# Patient Record
Sex: Female | Born: 1975
Health system: Southern US, Community
[De-identification: ages and names within clinical notes are randomized; demographics above are authoritative.]

## PROBLEM LIST (undated history)

## (undated) ENCOUNTER — Emergency Department (HOSPITAL_COMMUNITY): Discharge status: Absent without leave | Payer: 59

## (undated) DIAGNOSIS — R079 Chest pain, unspecified: Secondary | ICD-10-CM

## (undated) DIAGNOSIS — R002 Palpitations: Secondary | ICD-10-CM

## (undated) DIAGNOSIS — D259 Leiomyoma of uterus, unspecified: Secondary | ICD-10-CM

## (undated) DIAGNOSIS — K219 Gastro-esophageal reflux disease without esophagitis: Secondary | ICD-10-CM

## (undated) DIAGNOSIS — F419 Anxiety disorder, unspecified: Secondary | ICD-10-CM

## (undated) DIAGNOSIS — E119 Type 2 diabetes mellitus without complications: Secondary | ICD-10-CM

## (undated) DIAGNOSIS — M549 Dorsalgia, unspecified: Secondary | ICD-10-CM

## (undated) DIAGNOSIS — K449 Diaphragmatic hernia without obstruction or gangrene: Secondary | ICD-10-CM

## (undated) DIAGNOSIS — I10 Essential (primary) hypertension: Secondary | ICD-10-CM

## (undated) HISTORY — PX: WISDOM TOOTH EXTRACTION: SHX21

## (undated) HISTORY — DX: Diaphragmatic hernia without obstruction or gangrene: K44.9

## (undated) HISTORY — DX: Essential (primary) hypertension: I10

## (undated) HISTORY — DX: Anxiety disorder, unspecified: F41.9

## (undated) HISTORY — DX: Gastro-esophageal reflux disease without esophagitis: K21.9

## (undated) HISTORY — DX: Leiomyoma of uterus, unspecified: D25.9

## (undated) HISTORY — DX: Palpitations: R00.2

## (undated) HISTORY — DX: Chest pain, unspecified: R07.9

---

## 2009-04-29 ENCOUNTER — Ambulatory Visit (HOSPITAL_COMMUNITY): Admission: RE | Admit: 2009-04-29 | Discharge: 2009-04-29 | Payer: Self-pay | Admitting: Emergency Medicine

## 2011-06-28 ENCOUNTER — Emergency Department (HOSPITAL_COMMUNITY)
Admission: EM | Admit: 2011-06-28 | Discharge: 2011-06-29 | Disposition: A | Discharge status: Home / Self Care | Payer: 59 | Attending: Emergency Medicine | Admitting: Emergency Medicine

## 2011-06-28 DIAGNOSIS — R42 Dizziness and giddiness: Secondary | ICD-10-CM | POA: Insufficient documentation

## 2011-06-28 DIAGNOSIS — F411 Generalized anxiety disorder: Secondary | ICD-10-CM | POA: Insufficient documentation

## 2011-06-28 DIAGNOSIS — R079 Chest pain, unspecified: Secondary | ICD-10-CM | POA: Insufficient documentation

## 2011-06-28 DIAGNOSIS — I10 Essential (primary) hypertension: Secondary | ICD-10-CM | POA: Insufficient documentation

## 2011-06-28 DIAGNOSIS — R0682 Tachypnea, not elsewhere classified: Secondary | ICD-10-CM | POA: Insufficient documentation

## 2011-06-29 LAB — CBC
HCT: 39.3 % (ref 36.0–46.0)
Hemoglobin: 13.3 g/dL (ref 12.0–15.0)
MCH: 29.1 pg (ref 26.0–34.0)
MCHC: 33.8 g/dL (ref 30.0–36.0)
MCV: 86 fL (ref 78.0–100.0)
Platelets: 299 10*3/uL (ref 150–400)
RBC: 4.57 MIL/uL (ref 3.87–5.11)
RDW: 13.7 % (ref 11.5–15.5)
WBC: 11.8 10*3/uL — ABNORMAL HIGH (ref 4.0–10.5)

## 2011-06-29 LAB — POCT I-STAT TROPONIN I: Troponin i, poc: 0 ng/mL (ref 0.00–0.08)

## 2011-06-29 LAB — URINALYSIS, ROUTINE W REFLEX MICROSCOPIC
Bilirubin Urine: NEGATIVE
Glucose, UA: NEGATIVE mg/dL
Ketones, ur: NEGATIVE mg/dL
Leukocytes, UA: NEGATIVE
Nitrite: NEGATIVE
Protein, ur: NEGATIVE mg/dL
Specific Gravity, Urine: 1.025 (ref 1.005–1.030)
Urobilinogen, UA: 0.2 mg/dL (ref 0.0–1.0)
pH: 5.5 (ref 5.0–8.0)

## 2011-06-29 LAB — POCT I-STAT, CHEM 8
BUN: 10 mg/dL (ref 6–23)
Calcium, Ion: 1.18 mmol/L (ref 1.12–1.32)
Chloride: 105 mEq/L (ref 96–112)
Creatinine, Ser: 0.9 mg/dL (ref 0.50–1.10)
Glucose, Bld: 117 mg/dL — ABNORMAL HIGH (ref 70–99)
HCT: 42 % (ref 36.0–46.0)
Hemoglobin: 14.3 g/dL (ref 12.0–15.0)
Potassium: 4 mEq/L (ref 3.5–5.1)
Sodium: 141 mEq/L (ref 135–145)
TCO2: 26 mmol/L (ref 0–100)

## 2011-06-29 LAB — DIFFERENTIAL
Basophils Absolute: 0 10*3/uL (ref 0.0–0.1)
Basophils Relative: 0 % (ref 0–1)
Eosinophils Absolute: 0.3 10*3/uL (ref 0.0–0.7)
Eosinophils Relative: 2 % (ref 0–5)
Lymphocytes Relative: 41 % (ref 12–46)
Lymphs Abs: 4.8 10*3/uL — ABNORMAL HIGH (ref 0.7–4.0)
Monocytes Absolute: 0.9 10*3/uL (ref 0.1–1.0)
Monocytes Relative: 7 % (ref 3–12)
Neutro Abs: 5.8 10*3/uL (ref 1.7–7.7)
Neutrophils Relative %: 49 % (ref 43–77)

## 2011-06-29 LAB — URINE MICROSCOPIC-ADD ON

## 2011-06-29 LAB — POCT PREGNANCY, URINE: Preg Test, Ur: NEGATIVE

## 2012-01-04 ENCOUNTER — Ambulatory Visit (INDEPENDENT_AMBULATORY_CARE_PROVIDER_SITE_OTHER): Payer: 59 | Admitting: Emergency Medicine

## 2012-01-04 DIAGNOSIS — N898 Other specified noninflammatory disorders of vagina: Secondary | ICD-10-CM

## 2012-01-04 DIAGNOSIS — R109 Unspecified abdominal pain: Secondary | ICD-10-CM

## 2012-01-04 DIAGNOSIS — Z202 Contact with and (suspected) exposure to infections with a predominantly sexual mode of transmission: Secondary | ICD-10-CM

## 2012-01-04 DIAGNOSIS — N912 Amenorrhea, unspecified: Secondary | ICD-10-CM

## 2012-01-04 LAB — POCT URINALYSIS DIPSTICK
Bilirubin, UA: NEGATIVE
Glucose, UA: NEGATIVE
Ketones, UA: NEGATIVE
Leukocytes, UA: NEGATIVE
Nitrite, UA: NEGATIVE
Protein, UA: NEGATIVE
Spec Grav, UA: 1.015
Urobilinogen, UA: 0.2
pH, UA: 6.5

## 2012-01-04 LAB — POCT UA - MICROSCOPIC ONLY
Bacteria, U Microscopic: NEGATIVE
Casts, Ur, LPF, POC: NEGATIVE
Crystals, Ur, HPF, POC: NEGATIVE
Mucus, UA: NEGATIVE
WBC, Ur, HPF, POC: NEGATIVE
Yeast, UA: NEGATIVE

## 2012-01-04 LAB — POCT URINE PREGNANCY: Preg Test, Ur: NEGATIVE

## 2012-01-04 NOTE — Progress Notes (Signed)
  Subjective:    Patient ID: Brooke Turner, female    DOB: 04-27-1976, 36 y.o.   MRN: [id number]  HPI patient enters with no menstrual period for the last 2 weeks. She is on no birth control pills and feels this may be the source of her trouble. She has had a sensation she is going to cramp and have a period but her menses has not started.    Review of Systems he has no breast tenderness headaches or nausea associated with being pregnant.     Objective:   Physical Exam physical exam of the abdomen reveals mild suprapubic tenderness. Pelvic examination reveals a 1 1/2 cm papillomatous growth coming off the right sidewall of the vagina. The uterus is normal size there are no adnexal masses there is no discharge present.        Assessment & Plan:  Assessment is amenorrhea probably physiologic. I do not feel this patient is pregnant she had no evidence of on exam of an ovarian cyst and she had no evidence of any STD by exam.

## 2012-01-05 LAB — HCG, QUANTITATIVE, PREGNANCY: hCG, Beta Chain, Quant, S: <2 m[IU]/mL

## 2012-01-05 LAB — TSH: TSH: 1.234 u[IU]/mL (ref 0.350–4.500)

## 2012-01-06 LAB — GC/CHLAMYDIA PROBE AMP, URINE
Chlamydia, Swab/Urine, PCR: NEGATIVE
GC Probe Amp, Urine: NEGATIVE

## 2012-01-25 ENCOUNTER — Telehealth: Payer: Self-pay

## 2012-01-25 ENCOUNTER — Other Ambulatory Visit: Payer: Self-pay | Admitting: *Deleted

## 2012-01-25 MED ORDER — LISINOPRIL 20 MG PO TABS: 20.0000 mg | ORAL_TABLET | Freq: Every day | ORAL | Status: DC

## 2012-01-25 NOTE — Telephone Encounter (Signed)
LMOM THAT RX IS BEING SENT IN

## 2012-01-25 NOTE — Telephone Encounter (Signed)
Pt is out of her meds and states she had pharmacy fax over her med refills and it has'nt been done yet she would like to receive her meds today and ask for that I put in as urgent.Marland Kitchen

## 2012-02-01 ENCOUNTER — Telehealth: Payer: Self-pay

## 2012-02-01 NOTE — Telephone Encounter (Signed)
Pt is needing referral info to [facility] she went to back in July she then said she would like for Dr Patsy Lager to call her because she knows about this and it is urgent she would like to get in to see them by Friday.

## 2012-02-01 NOTE — Telephone Encounter (Signed)
Called pt and LMOM with info about where she had gone in July and [facility] phone number. Asked for her to CB if she still needed to speak with Dr Patsy Lager or help from someone else. Told pt that Dr Patsy Lager will not be back in office until Fri.

## 2012-02-03 ENCOUNTER — Telehealth: Payer: Self-pay

## 2012-02-03 NOTE — Telephone Encounter (Signed)
Spoke with Dr. Patsy Lager regarding patient and her request to get her Lorazepam refilled.  Md would like patient to come in for office visit first.  Patient notified and she will RTC this weekend.  Also, she plans to [facility] Monday and speak with [facility] Monday regarding one of our staff.

## 2012-02-03 NOTE — Telephone Encounter (Signed)
[facility] FAXED OVER REFILL BUT WAS DENIED.  NEED TO KNOW WHY AND NEED REFILL ASAP IF POSSIBLE  ABOUT MEDICATION FOR ANXIETY AND STRESS.  PHONE [phone]

## 2012-02-04 ENCOUNTER — Telehealth: Payer: Self-pay

## 2012-02-04 NOTE — Telephone Encounter (Signed)
We denied her refill of lorazepam yesterday. She would like [place] to reconsider. She had a refill left but it expired and she was seen recently by Dr. Merla Riches, Just uses on "as needed" basis. [facility] [address]

## 2012-02-05 NOTE — Telephone Encounter (Signed)
She was actually seen by Dr. Cleta Alberts and not by me recently If I have seen her in the past and prescribed this I might consider giving her a small supply/ check her old chart Since she was originally calling for Dr. Dallas Schimke I assume  that's where the prescription started

## 2012-02-05 NOTE — Telephone Encounter (Signed)
Dr. Patsy Lager, who actually wrote the Rx advised that she would like for the patient to come in first before another refill. We cannot change that request.  Alycia Rossetti

## 2012-02-06 NOTE — Telephone Encounter (Signed)
Called pt LMOM to CB if she needs too but advised pt to RTC

## 2012-02-17 ENCOUNTER — Ambulatory Visit (INDEPENDENT_AMBULATORY_CARE_PROVIDER_SITE_OTHER): Payer: 59 | Admitting: Family Medicine

## 2012-02-17 ENCOUNTER — Encounter: Payer: Self-pay | Admitting: Family Medicine

## 2012-02-17 VITALS — BP 163/89 | HR 92 | Temp 97.3°F | Resp 16 | Ht 62.25 in | Wt 230.4 lb

## 2012-02-17 DIAGNOSIS — F419 Anxiety disorder, unspecified: Secondary | ICD-10-CM

## 2012-02-17 DIAGNOSIS — I10 Essential (primary) hypertension: Secondary | ICD-10-CM | POA: Insufficient documentation

## 2012-02-17 DIAGNOSIS — F411 Generalized anxiety disorder: Secondary | ICD-10-CM | POA: Insufficient documentation

## 2012-02-17 DIAGNOSIS — E669 Obesity, unspecified: Secondary | ICD-10-CM | POA: Insufficient documentation

## 2012-02-17 DIAGNOSIS — G8929 Other chronic pain: Secondary | ICD-10-CM

## 2012-02-17 DIAGNOSIS — Z309 Encounter for contraceptive management, unspecified: Secondary | ICD-10-CM | POA: Insufficient documentation

## 2012-02-17 DIAGNOSIS — M549 Dorsalgia, unspecified: Secondary | ICD-10-CM

## 2012-02-17 MED ORDER — ETONOGESTREL-ETHINYL ESTRADIOL 0.12-0.015 MG/24HR VA RING: VAGINAL_RING | VAGINAL | Status: DC

## 2012-02-17 MED ORDER — LISINOPRIL 20 MG PO TABS: 20.0000 mg | ORAL_TABLET | Freq: Every day | ORAL | Status: DC

## 2012-02-17 NOTE — Patient Instructions (Addendum)
Vitamin D Deficiency  Not having enough vitamin D is called a deficiency. Your body needs this vitamin to keep your bones strong and healthy. Having too little of it can make your bones soft or can cause other health problems.  HOME CARE  Take all vitamins, herbs, or nutrition drinks (supplements) as told by your doctor.   Have your blood tested 2 months after taking vitamins, herbs, or nutrition drinks.   Eat foods that have vitamin D. This includes:   Dairy products, cereals, or juices with added vitamin D. Check the label.   Fatty fish like salmon or trout.   Eggs.   Oysters.   Go outside for 10 to 15 minutes when the sun is shining. Do this 3 times a week. Do not do this if you have skin cancer.   Do not use tanning beds.   Stay at a healthy weight. Lose weight if needed.   Keep all doctor visits as told.  GET HELP IF:  You have questions.   You continue to have problems.   You feel sick to your stomach (nauseous) or throw up (vomit).   You cannot go poop (constipated).   You feel confused.   You have severe belly (abdominal) or back pain.  MAKE SURE YOU:  Understand these instructions.   Will watch your condition.   Will get help right away if you are not doing well or get worse.  Document Released: 10/13/2011 Document Reviewed: 10/11/2011 [facility] Patient Information 2012 [place], [place]       .Hypertension As your heart beats, it forces blood through your arteries. This force is your blood pressure. If the pressure is too high, it is called hypertension (HTN) or high blood pressure. HTN is dangerous because you may have it and not know it. High blood pressure may mean that your heart has to work harder to pump blood. Your arteries may be narrow or stiff. The extra work puts you at risk for heart disease, stroke, and other problems.  Blood pressure consists of two numbers, a higher number over a lower, 110/72, for example. It is stated as "110 over 72."  The ideal is below 120 for the top number (systolic) and under 80 for the bottom (diastolic). Write down your blood pressure today. You should pay close attention to your blood pressure if you have certain conditions such as:  Heart failure.   Prior heart attack.   Diabetes   Chronic kidney disease.   Prior stroke.   Multiple risk factors for heart disease.  To see if you have HTN, your blood pressure should be measured while you are seated with your arm held at the level of the heart. It should be measured at least twice. A one-time elevated blood pressure reading (especially in the Emergency Department) does not mean that you need treatment. There may be conditions in which the blood pressure is different between your right and left arms. It is important to see your caregiver soon for a recheck. Most people have essential hypertension which means that there is not a specific cause. This type of high blood pressure may be lowered by changing lifestyle factors such as:  Stress.   Smoking.   Lack of exercise.   Excessive weight.   Drug/tobacco/alcohol use.   Eating less salt.  Most people do not have symptoms from high blood pressure until it has caused damage to the body. Effective treatment can often prevent, delay or reduce that damage. TREATMENT  When  a cause has been identified, treatment for high blood pressure is directed at the cause. There are a large number of medications to treat HTN. These fall into several categories, and your caregiver will help you select the medicines that are best for you. Medications may have side effects. You should review side effects with your caregiver. If your blood pressure stays high after you have made lifestyle changes or started on medicines,   Your medication(s) may need to be changed.   Other problems may need to be addressed.   Be certain you understand your prescriptions, and know how and when to take your medicine.   Be sure to  follow up with your caregiver within the time frame advised (usually within two weeks) to have your blood pressure rechecked and to review your medications.   If you are taking more than one medicine to lower your blood pressure, make sure you know how and at what times they should be taken. Taking two medicines at the same time can result in blood pressure that is too low.  SEEK IMMEDIATE MEDICAL CARE IF:  You develop a severe headache, blurred or changing vision, or confusion.   You have unusual weakness or numbness, or a faint feeling.   You have severe chest or abdominal pain, vomiting, or breathing problems.  MAKE SURE YOU:   Understand these instructions.   Will watch your condition.   Will get help right away if you are not doing well or get worse.  Document Released: 10/24/2005 Document Revised: 10/13/2011 Document Reviewed: 06/13/2008 [facility] Patient Information 2012 [place], [place].     Sleep Apnea Sleep apnea is a common disorder. The main problem of this disorder is excessive daytime sleepiness and compromised quality of life. This may include social and emotional problems. There are two types of sleep apnea.  Obstructive sleep apnea is when breathing stops due to a blocked airway.   Central sleep apnea is a malfunction of the brain's normal signal to breathe.  SYMPTOMS  Restless sleep.   Falling asleep while driving and/or during the day.   Loss of energy.   Irritability.   Mood or behavior changes.   Loud, heavy snoring.   Morning headaches.   Trouble concentrating.   Forgetfulness.   Anxiety or depression.   Decreased interest in sex.  Not all people with sleep apnea have all of these symptoms. However, people who have a few of these symptoms should visit their caregiver for an evaluation. Problems related to untreated sleep apnea include:  High blood pressure (hypertension).   Coronary artery disease.   Impotence.   Cognitive dysfunction.     Memory loss.  TREATMENT  For mild cases, treatment may include avoiding sleeping on one's back.   For people with nasal congestion, a decongestant may be prescribed.   Patients with obstructive and central apnea should avoid depressants. This includes alcohol, sedatives and narcotics. Weight loss and diet control are encouraged for overweight patients.   Many serious cases of obstructive sleep apnea can be relieved by a treatment called nasal continuous positive airway pressure (nasal CPAP). Nasal CPAP uses a mask-like device and pump that work together to keep the airway open. The pump delivers air pressure during each breath.   Surgery may help some patients by stopping or reducing the narrowing of the airway due to anatomical defects.  PROGNOSIS  Removing the obstruction usually reverses hypertension and cardiac problems. Untreated, sleep apnea sufferers have a tendency to fall asleep during the  day. This is can result in serious accident or loss of ones job. RESEARCH Sleep apnea is currently one of the most active areas of sleep research.  Document Released: 10/14/2002 Document Revised: 10/13/2011 Document Reviewed: 02/09/2006 [facility] Patient Information 2012 [place], [place].

## 2012-02-17 NOTE — Progress Notes (Signed)
  Subjective:    Patient ID: Brooke Turner, female    DOB: 02-Sep-1976, 36 y.o.   MRN: [id number]  HPI This 36 y.o. AA female has HTN which is well controlled on current medication. She was seen  [facility] in Feb 2013 for amenorrhea ( she was taking Jolivette OCP at the time). She decided   to go back to using NUVARING (she had 1 package) which had been discontinued last year when  her BP was elevated. She is having no side effects and wants to continue with NUVARING.        She has a sleep disorder and is scheduled for Sleep Study this Sunday evening. She continues  to experience mild anxiety at bedtime and is using Kava which she prefers to Lorazepam. This  herbal supplement was recommended by a pharmacist and she gets her supply [facility].  Walking has helped her energy level and well as motivated her weight loss efforts.       Other concerns include alopecia located at both temples; she no longer pulls her hair back as  tightly but wonders if there is some metabolic reason for the hair loss. She has chronic back pain related to  large breasts and hopes to have a reduction sometime in the future.    Review of Systems  Constitutional: Negative.   Respiratory: Positive for chest tightness and shortness of breath. Negative for cough.   Cardiovascular: Positive for palpitations.  Gastrointestinal: Negative.   Genitourinary: Positive for menstrual problem.  Musculoskeletal: Positive for back pain.  Skin:       Hair loss at temples  Psychiatric/Behavioral: Positive for sleep disturbance. The patient is nervous/anxious.        Objective:   Physical Exam  Nursing note and vitals reviewed. Constitutional: She is oriented to person, place, and time. She appears well-developed and well-nourished. No distress.  HENT:  Head: Normocephalic and atraumatic.  Eyes: No scleral icterus.  Cardiovascular: Normal rate.   Pulmonary/Chest: Effort normal. No respiratory distress.    Neurological: She is alert and oriented to person, place, and time. No cranial nerve deficit.  Psychiatric: She has a normal mood and affect. Her behavior is normal. Thought content normal.          Assessment & Plan:   1. Obesity, Class III, BMI 40-49.9 (morbid obesity)  CBC, TSH Continue exercise for weight loss and anxiety/ stress management  2. HTN (hypertension)  Basic metabolic panel, Iron Continue Lisinopril 20 mg daily  3. Contraception management  NUVARING refilled x 6 months  4. Back pain, chronic  Vitamin D, 25-hydroxy; advise about needs for supplementation if deficient Fitted with Back Brace

## 2012-02-18 LAB — TSH: TSH: 1.336 u[IU]/mL (ref 0.350–4.500)

## 2012-02-18 LAB — BASIC METABOLIC PANEL
BUN: 10 mg/dL (ref 6–23)
CO2: 22 mEq/L (ref 19–32)
Calcium: 8.8 mg/dL (ref 8.4–10.5)
Chloride: 108 mEq/L (ref 96–112)
Creat: 0.95 mg/dL (ref 0.50–1.10)
Glucose, Bld: 90 mg/dL (ref 70–99)
Potassium: 4.2 mEq/L (ref 3.5–5.3)
Sodium: 141 mEq/L (ref 135–145)

## 2012-02-18 LAB — IRON: Iron: 63 ug/dL (ref 42–145)

## 2012-02-18 LAB — CBC
HCT: 40.2 % (ref 36.0–46.0)
Hemoglobin: 13.4 g/dL (ref 12.0–15.0)
MCH: 30 pg (ref 26.0–34.0)
MCHC: 33.3 g/dL (ref 30.0–36.0)
MCV: 89.9 fL (ref 78.0–100.0)
Platelets: 331 10*3/uL (ref 150–400)
RBC: 4.47 MIL/uL (ref 3.87–5.11)
RDW: 13.4 % (ref 11.5–15.5)
WBC: 10.7 10*3/uL — ABNORMAL HIGH (ref 4.0–10.5)

## 2012-02-18 LAB — VITAMIN D 25 HYDROXY (VIT D DEFICIENCY, FRACTURES): Vit D, 25-Hydroxy: 13 ng/mL — ABNORMAL LOW (ref 30–89)

## 2012-02-22 MED ORDER — ERGOCALCIFEROL 1.25 MG (50000 UT) PO CAPS: 50000.0000 [IU] | ORAL_CAPSULE | ORAL | Status: DC

## 2012-02-22 NOTE — Progress Notes (Signed)
Quick Note:  Please call pt and advise that the following labs are abnormal...  Low Vit D level. High potency supplement Drisdol ( generic) 50,000 IU is at her pharmacy. Take 1 capsule once a week; there are RFs for Several months. Once prescription supplement is finished, get an OTC Vit D supplement or Vit D with Calcium and take it daily. Also need to get 10-15 minutes of sun exposure most days of the week and try to eat more Vit-D rich foods (salmon, tuna, mushrooms, dairy products, etc).  Other labs are normal.  Copy of results to pt. ______

## 2012-02-22 NOTE — Progress Notes (Signed)
Addended by: Dow Adolph B on: 02/22/2012 07:14 PM   Modules accepted: Orders

## 2012-02-22 NOTE — Progress Notes (Signed)
Pt has Vit D def; Drisdol 50,000 IU  1 capsule to be taken once a week has been routed to pt's pharmacy. She should continue on this supplement until she has no more RFs then get OTC Vit D 1000- 2000 IU and take this daily in addition  to other measures to correct deficiency.

## 2012-02-23 NOTE — Progress Notes (Signed)
See notes under pts lab results 

## 2012-02-24 ENCOUNTER — Telehealth: Payer: Self-pay

## 2012-02-24 MED ORDER — ETONOGESTREL-ETHINYL ESTRADIOL 0.12-0.015 MG/24HR VA RING: VAGINAL_RING | VAGINAL | Status: DC

## 2012-02-24 NOTE — Telephone Encounter (Signed)
Pt would like someone to call her regarding her lab results.

## 2012-02-24 NOTE — Telephone Encounter (Signed)
LMOM to call back

## 2012-03-16 ENCOUNTER — Telehealth: Payer: Self-pay

## 2012-03-16 MED ORDER — LORAZEPAM 0.5 MG PO TABS: 0.5000 mg | ORAL_TABLET | Freq: Every day | ORAL | Status: DC | PRN

## 2012-03-16 NOTE — Telephone Encounter (Signed)
Rx called in and patient notified.  

## 2012-03-16 NOTE — Telephone Encounter (Signed)
Patient advised that Dr. Audria Nine forgot to send in RX for Ativan/Lorazepam because it was not at pharmacy when she went to pick up others meds. Could she please send e-script for it, and patient is trying to pick it up for this weekend. Would also like for it to be called in to pharmacy close to her, [facility] on [address]. Please call patient when RX sent to pharmacy.

## 2012-03-16 NOTE — Telephone Encounter (Signed)
Can we fill this?

## 2012-03-16 NOTE — Telephone Encounter (Signed)
Done and printed

## 2012-05-28 ENCOUNTER — Ambulatory Visit: Payer: 59 | Admitting: Physician Assistant

## 2012-06-07 ENCOUNTER — Ambulatory Visit (INDEPENDENT_AMBULATORY_CARE_PROVIDER_SITE_OTHER): Payer: 59 | Admitting: Physician Assistant

## 2012-06-07 VITALS — BP 144/94 | HR 85 | Temp 98.6°F | Resp 20 | Ht 62.5 in | Wt 243.0 lb

## 2012-06-07 DIAGNOSIS — I10 Essential (primary) hypertension: Secondary | ICD-10-CM

## 2012-06-07 DIAGNOSIS — R05 Cough: Secondary | ICD-10-CM

## 2012-06-07 DIAGNOSIS — Z3041 Encounter for surveillance of contraceptive pills: Secondary | ICD-10-CM

## 2012-06-07 DIAGNOSIS — R059 Cough, unspecified: Secondary | ICD-10-CM

## 2012-06-07 DIAGNOSIS — F419 Anxiety disorder, unspecified: Secondary | ICD-10-CM

## 2012-06-07 DIAGNOSIS — Z77098 Contact with and (suspected) exposure to other hazardous, chiefly nonmedicinal, chemicals: Secondary | ICD-10-CM

## 2012-06-07 DIAGNOSIS — R110 Nausea: Secondary | ICD-10-CM

## 2012-06-07 MED ORDER — LORAZEPAM 0.5 MG PO TABS: 0.5000 mg | ORAL_TABLET | Freq: Every day | ORAL | Status: DC | PRN

## 2012-06-07 MED ORDER — BENZONATATE 100 MG PO CAPS: ORAL_CAPSULE | ORAL | Status: AC

## 2012-06-07 MED ORDER — HYDROCHLOROTHIAZIDE 12.5 MG PO TABS: 12.5000 mg | ORAL_TABLET | Freq: Every day | ORAL | Status: DC

## 2012-06-07 MED ORDER — ETONOGESTREL-ETHINYL ESTRADIOL 0.12-0.015 MG/24HR VA RING: VAGINAL_RING | VAGINAL | Status: DC

## 2012-06-07 MED ORDER — ONDANSETRON HCL 4 MG PO TABS: 4.0000 mg | ORAL_TABLET | Freq: Three times a day (TID) | ORAL | Status: AC | PRN

## 2012-06-07 NOTE — Progress Notes (Signed)
  Subjective:    Patient ID: Brooke Turner, female    DOB: Nov 23, 1975, 36 y.o.   MRN: [id number]  HPI Pt presents to clinic after a bleach exposure in her house.  She spilled almost an entire gallon of bleach on her floor and had to clean it up.  While she was cleaning the spill she experienced burning in her nose and SOB.  Today that has improved though she has a cough an has some slight nausea.  She did have to sleep in her house but she shut her bedroom door and the smell decreased.  She has been out of the house all day.  She has taken no medication and she has no h/o asthma.  She has h/o BP and it is better controlled but her at home readings are still 140s/90s and she is experiencing feet swelling for the last several months (about 6 months).  The swelling is reduce in the am.  Pt also needs her [facility] refilled, something happened to her Rx at the pharmacy.   Review of Systems  HENT: Positive for sore throat and rhinorrhea. Negative for trouble swallowing and voice change.   Eyes: Positive for itching (irritation that has been improving).  Respiratory: Positive for cough. Negative for shortness of breath and wheezing.   Cardiovascular: Negative for chest pain.       Objective:   Physical Exam  Vitals reviewed. Constitutional: She is oriented to person, place, and time. She appears well-developed and well-nourished.  HENT:  Head: Normocephalic and atraumatic.  Right Ear: External ear normal.  Left Ear: External ear normal.  Nose: Nose normal.  Mouth/Throat: Oropharynx is clear and moist. No oropharyngeal exudate.  Eyes: Conjunctivae and EOM are normal. Pupils are equal, round, and reactive to light.  Neck: Neck supple.  Cardiovascular: Normal rate, regular rhythm and normal heart sounds.   No murmur heard. Pulmonary/Chest: Effort normal and breath sounds normal. She has no wheezes.  Lymphadenopathy:    She has cervical adenopathy (AC enlarged and mild TTP B).  Neurological:  She is alert and oriented to person, place, and time.  Skin: Skin is warm and dry.  Psychiatric: She has a normal mood and affect. Her behavior is normal. Judgment and thought content normal.       Assessment & Plan:   1. Chemical exposure    2. Nausea  ondansetron (ZOFRAN) 4 MG tablet  3. HTN (hypertension)  hydrochlorothiazide (HYDRODIURIL) 12.5 MG tablet  4. Anxiety  LORazepam (ATIVAN) 0.5 MG tablet  5. Family planning, BCP (birth control pills) maintenance  etonogestrel-ethinyl estradiol (NUVARING) 0.12-0.015 MG/24HR vaginal ring  6. Cough  benzonatate (TESSALON) 100 MG capsule   Pt to expect to return home and smell to be there and possible cause some increase in symptoms of cough and burning eyes.  Pt to open windows and air out the area if she starts to experience these symptoms.  Pt given medications to help with her cough and nausea.  Due to not optimal BP control and LE edema will add HCTZ to her BP medication.  If this helps will send in the combo pill next month for the patient.  BC and anxiety meds refilled.

## 2012-07-03 ENCOUNTER — Telehealth: Payer: Self-pay

## 2012-07-03 NOTE — Telephone Encounter (Signed)
Pt wants to talk with dr Audria Nine regarding blood pressure, anxiety and stress concerns. States she is having a really fast heart beat and wants to know what could cause that. Also wants to advsied doctor that she will be submitting FMLA paperwork, as well.

## 2012-07-03 NOTE — Telephone Encounter (Signed)
I have spoken to patient, she had FMLA last year because of Anxiety and Stress. She states her heart rate has been 112 to 118 happening more frequently now. She states her BP okay but her heart races. I have advised her to come in for this. She wants you to extend her FMLA, she states she will make appt for the heart rate issues. She asked what can cause this, I have advised multiple medical issues can cause her to feel like her heart is racing, and she needs to come in and have a physician evaluate this. I advised if it remains constant she should come to walk in clinic to be evaluated urgently.

## 2012-07-03 NOTE — Telephone Encounter (Signed)
Pt does need to return to clinic for evaluation.

## 2012-07-03 NOTE — Telephone Encounter (Signed)
I have called patient to advise and she is making an appt with Dr Audria Nine

## 2012-07-06 ENCOUNTER — Ambulatory Visit: Payer: 59 | Admitting: Family Medicine

## 2012-07-13 ENCOUNTER — Ambulatory Visit (INDEPENDENT_AMBULATORY_CARE_PROVIDER_SITE_OTHER): Payer: 59 | Admitting: Family Medicine

## 2012-07-13 VITALS — BP 170/90 | HR 93 | Temp 98.1°F | Resp 20 | Ht 64.0 in | Wt 238.0 lb

## 2012-07-13 DIAGNOSIS — R1013 Epigastric pain: Secondary | ICD-10-CM

## 2012-07-13 DIAGNOSIS — Z862 Personal history of diseases of the blood and blood-forming organs and certain disorders involving the immune mechanism: Secondary | ICD-10-CM

## 2012-07-13 DIAGNOSIS — F419 Anxiety disorder, unspecified: Secondary | ICD-10-CM

## 2012-07-13 DIAGNOSIS — E559 Vitamin D deficiency, unspecified: Secondary | ICD-10-CM

## 2012-07-13 DIAGNOSIS — Z113 Encounter for screening for infections with a predominantly sexual mode of transmission: Secondary | ICD-10-CM

## 2012-07-13 DIAGNOSIS — K3189 Other diseases of stomach and duodenum: Secondary | ICD-10-CM

## 2012-07-13 DIAGNOSIS — F411 Generalized anxiety disorder: Secondary | ICD-10-CM

## 2012-07-13 DIAGNOSIS — N644 Mastodynia: Secondary | ICD-10-CM

## 2012-07-13 LAB — HIV ANTIBODY (ROUTINE TESTING W REFLEX): HIV: NONREACTIVE

## 2012-07-13 MED ORDER — FAMOTIDINE 20 MG PO TABS: 20.0000 mg | ORAL_TABLET | Freq: Two times a day (BID) | ORAL | Status: DC

## 2012-07-13 MED ORDER — LISINOPRIL 20 MG PO TABS: 20.0000 mg | ORAL_TABLET | Freq: Every day | ORAL | Status: DC

## 2012-07-13 MED ORDER — LORAZEPAM 0.5 MG PO TABS: 0.5000 mg | ORAL_TABLET | Freq: Every day | ORAL | Status: DC | PRN

## 2012-07-14 LAB — RPR

## 2012-07-14 LAB — VITAMIN D 25 HYDROXY (VIT D DEFICIENCY, FRACTURES): Vit D, 25-Hydroxy: 23 ng/mL — ABNORMAL LOW (ref 30–89)

## 2012-07-16 ENCOUNTER — Encounter: Payer: Self-pay | Admitting: Family Medicine

## 2012-07-16 NOTE — Progress Notes (Signed)
Subjective:    Patient ID: Brooke Turner, female    DOB: Jun 24, 1976, 36 y.o.   MRN: [id number]  HPI  This 36 y.o. AA female is here today, quite anxious regarding episodes of rapid heart rate and  elevated BP measured on her home monitor. The most recent episode occurred one night within  the last week; she was having some mild left upper chest/ breast pain which resulted in mild anxiety  attack, inability to sleep (frequent awakenings through the night) and fatigue the next day. She  measured her BP several times, getting varying heart rates (80-120) and BP 90-100/60-80 up to   120-160/98-110. Lorazepam provided some relief.   Pt has had this type of anxiety for > 1 year but declines to have more thorough evaluation.  She admits that her father's death ini 08-24-12sent her into "a deep depression" from  which she has not fully recovered (did not have any grief counseling).   Re: Fatigue and sleep disorder- pt was scheduled for a sleep study which she rescheduled but has  not had as of today. She does not think that she has sleep apnea and does not see the point of having  the study done.   Re: request for STD screening- she recently found out that the man she had a casual relationship with   (they were not "really a couple") was seeing other women. It is not clear whether they had an  intimate relationship or not.   She has a hx of anemia and also has a family hx of Thalassemia and wants blood testing done to   check for this disorder as this may be the cause of fatigue.    Review of Systems  Constitutional: Positive for fatigue. Negative for fever, diaphoresis, appetite change and unexpected weight change.  HENT: Negative.   Eyes: Negative.   Respiratory: Positive for chest tightness and shortness of breath. Negative for cough, choking and wheezing.   Cardiovascular: Positive for chest pain and palpitations. Negative for leg swelling.  Gastrointestinal: Negative.     Musculoskeletal: Negative.   Neurological: Positive for weakness and light-headedness. Negative for syncope, numbness and headaches.  Psychiatric/Behavioral: Positive for disturbed wake/sleep cycle and dysphoric mood. Negative for confusion and agitation. The patient is nervous/anxious.        Objective:   Physical Exam  Nursing note and vitals reviewed. Constitutional: She is oriented to person, place, and time. She appears well-developed and well-nourished. No distress.  HENT:  Head: Normocephalic and atraumatic.  Nose: Nose normal.  Mouth/Throat: Oropharynx is clear and moist.  Eyes: Conjunctivae and EOM are normal. Pupils are equal, round, and reactive to light. No scleral icterus.  Neck: Normal range of motion. Neck supple. No thyromegaly present.  Cardiovascular: Normal rate, regular rhythm and normal heart sounds.  Exam reveals no gallop and no friction rub.   No murmur heard. Pulmonary/Chest: Effort normal and breath sounds normal. No respiratory distress. She has no wheezes. She exhibits tenderness. Right breast exhibits no inverted nipple, no mass, no skin change and no tenderness. Left breast exhibits tenderness. Left breast exhibits no inverted nipple, no mass and no skin change. Breasts are symmetrical.       Breasts are extremely dense; left breast- upper inner quadrant is location of tenderness (this correlates with area of pain described by pt)  Abdominal: Soft. She exhibits no mass. There is no tenderness. There is no guarding.  Musculoskeletal: Normal range of motion. She exhibits no edema and  no tenderness.  Lymphadenopathy:    She has no cervical adenopathy.  Neurological: She is alert and oriented to person, place, and time. She has normal reflexes. No cranial nerve deficit. She exhibits normal muscle tone.  Skin: Skin is warm and dry.  Psychiatric: Her speech is normal and behavior is normal. Thought content normal. Her mood appears anxious. Her affect is not labile  and not inappropriate. Cognition and memory are normal. She does not exhibit a depressed mood.          Assessment & Plan:   1. Breast pain, left  MM Digital Screening ECG done August 2012- normal.  2. Unspecified vitamin D deficiency  Vitamin D, 25-hydroxy Advised to resume Vitamin D 50,000 IU supplement  3. Anxiety  LORazepam (ATIVAN) 0.5 MG tablet  4. Screening examination for venereal disease  HIV antibody, RPR  5. History of anemia - advised pt that her most recent Hgb was normal with normal indices. Hemoglobinopathy evaluation  6. Dyspepsia  RX: Famotidine 20 mg 1 tablet twice a day.

## 2012-07-17 LAB — HEMOGLOBINOPATHY EVALUATION
Hemoglobin Other: 0 %
Hgb A2 Quant: 2.4 % (ref 2.2–3.2)
Hgb A: 97.6 % (ref 96.8–97.8)
Hgb F Quant: 0 % (ref 0.0–2.0)
Hgb S Quant: 0 %

## 2012-07-18 NOTE — Progress Notes (Signed)
Quick Note:  Please call pt and advise that the following labs are abnormal... Vit D level has improved by 10 points (from 13 to 23). Continue Vit D supplement.  Testing for Thalassemia shows normal results; you do not have Thalassemia.  STD screening tests are negative.  Copy to pt. ______

## 2012-07-27 ENCOUNTER — Ambulatory Visit
Admission: RE | Admit: 2012-07-27 | Discharge: 2012-07-27 | Disposition: A | Discharge status: Home / Self Care | Payer: 59 | Source: Ambulatory Visit | Attending: Family Medicine | Admitting: Family Medicine

## 2012-07-27 ENCOUNTER — Other Ambulatory Visit: Payer: Self-pay | Admitting: Family Medicine

## 2012-07-27 DIAGNOSIS — N644 Mastodynia: Secondary | ICD-10-CM

## 2012-08-08 ENCOUNTER — Ambulatory Visit (INDEPENDENT_AMBULATORY_CARE_PROVIDER_SITE_OTHER): Payer: 59 | Admitting: Physician Assistant

## 2012-08-08 VITALS — BP 156/108 | HR 113 | Temp 98.5°F | Resp 20 | Ht 62.5 in | Wt 239.0 lb

## 2012-08-08 DIAGNOSIS — J029 Acute pharyngitis, unspecified: Secondary | ICD-10-CM

## 2012-08-08 DIAGNOSIS — R059 Cough, unspecified: Secondary | ICD-10-CM

## 2012-08-08 DIAGNOSIS — J310 Chronic rhinitis: Secondary | ICD-10-CM

## 2012-08-08 DIAGNOSIS — R05 Cough: Secondary | ICD-10-CM

## 2012-08-08 MED ORDER — GUAIFENESIN ER 1200 MG PO TB12: 1.0000 | ORAL_TABLET | Freq: Two times a day (BID) | ORAL | Status: DC | PRN

## 2012-08-08 MED ORDER — HYDROCOD POLST-CHLORPHEN POLST 10-8 MG/5ML PO LQCR: 5.0000 mL | Freq: Two times a day (BID) | ORAL | Status: DC | PRN

## 2012-08-08 MED ORDER — IPRATROPIUM BROMIDE 0.03 % NA SOLN: 2.0000 | Freq: Two times a day (BID) | NASAL | Status: DC

## 2012-08-08 MED ORDER — AZITHROMYCIN 250 MG PO TABS: ORAL_TABLET | ORAL | Status: DC

## 2012-08-08 NOTE — Patient Instructions (Signed)
Get plenty of rest and drink at least 64 ounces of water daily.   Your blood pressure is elevated today, likely due to your coughing and illness. Please recheck it once you are well.  If it is consistently above 140/90, please return for additional treatment.

## 2012-08-08 NOTE — Progress Notes (Signed)
Subjective:    Patient ID: Brooke Turner, female    DOB: 12/25/1975, 36 y.o.   MRN: [id number]  HPI This 36 y.o. female presents for evaluation of illness.  Symptoms began as sinus pressure, drainage, laryngitis.  Now coughing, sometimes productive of yellowish mucous, to the point of gagging/vomiting.  THis is a typical illness progression for her, but has progressed more quickly than usual this time.  Has had chills, but no fever.  No nausea.  Some HA.  No dizziness, diarrhea, myalgias, arthralgias or rash.  Chest feels sore with coughing.  Tessalon Perles leftover from a previous episode of similar symptoms aren't helping. Face and neck are sore x a few days.  Face has a lot of pressure.  States that "usually a Z-pack is the only thing that works for me."  Review of Systems As above.   Past Medical History  Diagnosis Date  . Hypertension   . GERD (gastroesophageal reflux disease)     History reviewed. No pertinent past surgical history.  Prior to Admission medications   Medication Sig Start Date End Date Taking? Authorizing Provider  benzonatate (TESSALON) 100 MG capsule  06/10/12  Yes Historical Provider, MD  lisinopril (PRINIVIL,ZESTRIL) 20 MG tablet Take 1 tablet (20 mg total) by mouth daily. 07/13/12  Yes Maurice March, MD  LORazepam (ATIVAN) 0.5 MG tablet Take 1 tablet (0.5 mg total) by mouth daily as needed. 07/13/12  Yes Maurice March, MD  etonogestrel-ethinyl estradiol (NUVARING) 0.12-0.015 MG/24HR vaginal ring Insert vaginally and leave in place for 3 consecutive weeks, then remove for 1 week. 06/07/12 06/07/13  Morrell Riddle, PA-C  famotidine (PEPCID) 20 MG tablet Take 1 tablet (20 mg total) by mouth 2 (two) times daily. 07/13/12 07/13/13  Maurice March, MD    Allergies  Allergen Reactions  . Penicillins Hives    History   Social History  . Marital Status: Single    Spouse Name: N/A    Number of Children: 0  . Years of Education: 16   Occupational  History  . Customer Service [employer]   Social History Main Topics  . Smoking status: Current Some Day Smoker  . Smokeless tobacco: Never Used   Comment: Maybe six cigarerres a week  . Alcohol Use: Yes     occassionally  . Drug Use: No  . Sexually Active: Not Currently -- Female partner(s)    Birth Control/ Protection: Other-see comments     NuvaRing   Other Topics Concern  . Not on file   Social History Narrative   Lives alone.    Family History  Problem Relation Age of Onset  . Seizures Mother   . Cancer Father     Liver  . Diabetes Father 43  . Hypertension Father   . Heart disease Father     CEA, LE [place]  . Alzheimer's disease Maternal Grandmother   . Heart disease Maternal Grandfather        Objective:   Physical Exam  Blood pressure 156/108, pulse 113, temperature 98.5 F (36.9 C), temperature source Oral, resp. rate 20, height 5' 2.5" (1.588 m), weight 239 lb (108.41 kg), last menstrual period 08/01/2012, SpO2 98.00%. Body mass index is 43.02 kg/(m^2). Well-developed, well nourished BF who is awake, alert and oriented, in NAD. HEENT: [place]/AT, PERRL, EOMI.  Sclera and conjunctiva are clear.  EAC are patent, TMs are normal in appearance. Nasal mucosa is congested, pink and moist. OP is clear. Neck: supple, non-tender, no lymphadenopathy,  thyromegaly. Heart: RRR, no murmur Lungs: normal effort, CTA Extremities: no cyanosis, clubbing or edema. Skin: warm and dry without rash. Psychologic: good mood and appropriate affect, normal speech and behavior.     Assessment & Plan:   1. Cough  azithromycin (ZITHROMAX) 250 MG tablet, chlorpheniramine-HYDROcodone (TUSSIONEX PENNKINETIC ER) 10-8 MG/5ML LQCR  2. Acute pharyngitis  Guaifenesin (MUCINEX MAXIMUM STRENGTH) 1200 MG TB12  3. Rhinitis  ipratropium (ATROVENT) 0.03 % nasal spray, Guaifenesin (MUCINEX MAXIMUM STRENGTH) 1200 MG TB12   Patient Instructions  Get plenty of rest and drink at least 64 ounces  of water daily.   Your blood pressure is elevated today, likely due to your coughing and illness. Please recheck it once you are well.  If it is consistently above 140/90, please return for additional treatment.

## 2012-08-10 ENCOUNTER — Ambulatory Visit: Payer: 59 | Admitting: Family Medicine

## 2012-08-17 ENCOUNTER — Ambulatory Visit: Payer: 59

## 2012-08-17 ENCOUNTER — Ambulatory Visit (INDEPENDENT_AMBULATORY_CARE_PROVIDER_SITE_OTHER): Payer: 59 | Admitting: Family Medicine

## 2012-08-17 ENCOUNTER — Encounter: Payer: Self-pay | Admitting: Family Medicine

## 2012-08-17 VITALS — BP 169/103 | HR 101 | Temp 99.2°F | Resp 18 | Ht 62.5 in | Wt 242.0 lb

## 2012-08-17 DIAGNOSIS — J40 Bronchitis, not specified as acute or chronic: Secondary | ICD-10-CM

## 2012-08-17 DIAGNOSIS — I10 Essential (primary) hypertension: Secondary | ICD-10-CM

## 2012-08-17 MED ORDER — ATENOLOL-CHLORTHALIDONE 50-25 MG PO TABS: ORAL_TABLET | ORAL | Status: DC

## 2012-08-17 MED ORDER — PREDNISONE 10 MG PO TABS: ORAL_TABLET | ORAL | Status: DC

## 2012-08-17 MED ORDER — PREDNISONE 5 MG PO TABS: ORAL_TABLET | ORAL | Status: DC

## 2012-08-17 NOTE — Patient Instructions (Addendum)
Hypertension Information As your heart beats, it forces blood through your arteries. This force is your blood pressure. If the pressure is too high, it is called hypertension (HTN) or high blood pressure. HTN is dangerous because you may have it and not know it. High blood pressure may mean that your heart has to work harder to pump blood. Your arteries may be narrow or stiff. The extra work puts you at risk for heart disease, stroke, and other problems.  Blood pressure consists of two numbers, a higher number over a lower, 110/72, for example. It is stated as "110 over 72." The ideal is below 120 for the top number (systolic) and under 80 for the bottom (diastolic).  You should pay close attention to your blood pressure if you have certain conditions such as:  Heart failure.   Prior heart attack.   Diabetes   Chronic kidney disease.   Prior stroke.   Multiple risk factors for heart disease.  To see if you have HTN, your blood pressure should be measured while you are seated with your arm held at the level of the heart. It should be measured at least twice. A one-time elevated blood pressure reading (especially in the Emergency Department) does not mean that you need treatment. There may be conditions in which the blood pressure is different between your right and left arms. It is important to see your caregiver soon for a recheck. Most people have essential hypertension which means that there is not a specific cause. This type of high blood pressure may be lowered by changing lifestyle factors such as:  Stress.   Smoking.   Lack of exercise.   Excessive weight.   Drug/tobacco/alcohol use.   Eating less salt.  Most people do not have symptoms from high blood pressure until it has caused damage to the body. Effective treatment can often prevent, delay or reduce that damage. TREATMENT  Treatment for high blood pressure, when a cause has been identified, is directed at the cause. There  are a large number of medications to treat HTN. These fall into several categories, and your caregiver will help you select the medicines that are best for you. Medications may have side effects. You should review side effects with your caregiver. If your blood pressure stays high after you have made lifestyle changes or started on medicines,   Your medication(s) may need to be changed.   Other problems may need to be addressed.   Be certain you understand your prescriptions, and know how and when to take your medicine.   Be sure to follow up with your caregiver within the time frame advised (usually within two weeks) to have your blood pressure rechecked and to review your medications.   If you are taking more than one medicine to lower your blood pressure, make sure you know how and at what times they should be taken. Taking two medicines at the same time can result in blood pressure that is too low.  Document Released: 12/27/2005 Document Revised: 07/06/2011 Document Reviewed: 01/03/2008 Exit[employer] Patient Information 2012 [employer]. 

## 2012-08-17 NOTE — Progress Notes (Signed)
  Subjective:    Patient ID: Brooke Turner, female    DOB: 02-Nov-1976, 36 y.o.   MRN: [id number]  HPI   This 36 y.o. AA female returns today for follow-up after recent treatment for bronchitis.  She has finished Z-PAK and still has nonprod cough with chest pain. Work is difficult because  her job involves talking much of the time. Cough meds somewhat effective.   HTN- pt thinks BP is up because of cough. Home readings are 140-150/90/ She denies HA, CP or  tightness, dizziness, numbness, weakness or syncope. Does have occasional palpitations.  Pt does not think cough is related to Lisinopril (states "this cough is different").     Review of Systems  Constitutional: Positive for fever. Negative for diaphoresis and unexpected weight change.  HENT: Positive for congestion, rhinorrhea and sinus pressure. Negative for ear pain, sneezing, trouble swallowing and neck stiffness.   Eyes: Negative for visual disturbance.  Respiratory: Positive for cough. Negative for chest tightness and wheezing.   Cardiovascular: Positive for leg swelling. Negative for chest pain.  Neurological: Negative.        Objective:   Physical Exam  Nursing note and vitals reviewed. Constitutional: She is oriented to person, place, and time. She appears well-developed and well-nourished. No distress.  HENT:  Head: Normocephalic and atraumatic.  Right Ear: Hearing, external ear and ear canal normal. Tympanic membrane is scarred.  Left Ear: Hearing, external ear and ear canal normal.  Nose: Mucosal edema present. No rhinorrhea, nasal deformity or septal deviation. Right sinus exhibits maxillary sinus tenderness. Right sinus exhibits no frontal sinus tenderness. Left sinus exhibits maxillary sinus tenderness. Left sinus exhibits no frontal sinus tenderness.  Mouth/Throat: Uvula is midline and mucous membranes are normal. No oral lesions. Normal dentition. Posterior oropharyngeal erythema present. No oropharyngeal  exudate.  Eyes: Conjunctivae normal and EOM are normal. No scleral icterus.  Neck: Normal range of motion.  Cardiovascular: Regular rhythm and normal heart sounds.   No extrasystoles are present. Tachycardia present.  Exam reveals no friction rub.   No murmur heard. Pulmonary/Chest: No accessory muscle usage. Not tachypneic. No respiratory distress. She has decreased breath sounds in the right lower field and the left lower field. She has no wheezes. She has no rhonchi. She exhibits no tenderness.       Fair inspiratory effort  Musculoskeletal: Normal range of motion. She exhibits no edema.  Lymphadenopathy:    She has no cervical adenopathy.  Neurological: She is alert and oriented to person, place, and time. No cranial nerve deficit.  Skin: Skin is warm and dry. No rash noted.     [facility] reading (PRIMARY) by  Dr. Audria Nine: CXR- no active disease. Heart size normal.      Assessment & Plan:   1. Bronchitis  RX: Predisone  5 mg tabs   Taper from 2 tabs tid on Day 1 (taking 1 less tab each day) to 1 tab on Day 6 Take all doses with meals or snacks (Pt very concerned about weight gain)  2. HTN, goal below 130/80 - pt stated that HCTZ not effective for lowering BP Continue Lisinopril RX: Atenolol- Chlorthalidone 50-25 mg  1/2 tab daily until return visit in 3 weeks.   Pt mentions FMLA paperwork that she may need completed again for the next 12 months.  Advised to bring form to follow-up visit.

## 2012-08-18 ENCOUNTER — Telehealth: Payer: Self-pay | Admitting: Radiology

## 2012-08-18 NOTE — Telephone Encounter (Signed)
Pharmacy called to verify the dose on the prednisone advised.

## 2012-08-20 ENCOUNTER — Telehealth: Payer: Self-pay

## 2012-08-20 NOTE — Telephone Encounter (Signed)
PT SAW DR [facility] ON Friday FOR HER COUGH. SHE STARTED TAKING HER PREDNISONE YESTERDAY MORNING BUT SHE SAYS IT IS NOT WORKING YET ANS WANTS TO KNOW WHEN IT WILL KICK IN. SHE STATES THAT SHE IS COUGHING SO HARD ALMOST TO THE POINT OF PASSING OUT. PLEASE CALL [phone]

## 2012-08-20 NOTE — Telephone Encounter (Signed)
Please advise 

## 2012-08-21 ENCOUNTER — Telehealth: Payer: Self-pay | Admitting: Family Medicine

## 2012-08-21 DIAGNOSIS — R059 Cough, unspecified: Secondary | ICD-10-CM

## 2012-08-21 DIAGNOSIS — R05 Cough: Secondary | ICD-10-CM

## 2012-08-21 MED ORDER — HYDROCOD POLST-CHLORPHEN POLST 10-8 MG/5ML PO LQCR: 5.0000 mL | Freq: Two times a day (BID) | ORAL | Status: DC | PRN

## 2012-08-21 MED ORDER — BENZONATATE 100 MG PO CAPS: ORAL_CAPSULE | ORAL | Status: DC

## 2012-08-21 NOTE — Telephone Encounter (Addendum)
I am going to refill Tussionex liquid for pt as well as Tessalon perles. Predisone should be helping; I prescribed a lower dose for pt because she had so many concerns about side effects of steroids. The refills will be routed to her pharmacy.  Pt was contact by staff at 104 to pick up RX for Tussionex. She had a 30-minute conversation with staff person (she "did not want any more cough medication",etc.); she was offered an appt to return to clinic on Wednesday AM. She declined and stated that she would wait. She was advised to make an appt for Friday afternoon and she could cancel it if she felt better. She voiced understanding to the staff person (Renay [employer], Careers adviser) who spoke with her.

## 2012-08-22 NOTE — Telephone Encounter (Signed)
Thanks

## 2012-09-07 ENCOUNTER — Encounter: Payer: Self-pay | Admitting: Family Medicine

## 2012-09-07 ENCOUNTER — Ambulatory Visit (INDEPENDENT_AMBULATORY_CARE_PROVIDER_SITE_OTHER): Payer: 59 | Admitting: Family Medicine

## 2012-09-07 VITALS — BP 124/76 | HR 88 | Temp 98.2°F | Resp 17 | Ht 64.0 in | Wt 241.0 lb

## 2012-09-07 DIAGNOSIS — R0600 Dyspnea, unspecified: Secondary | ICD-10-CM

## 2012-09-07 DIAGNOSIS — I10 Essential (primary) hypertension: Secondary | ICD-10-CM

## 2012-09-07 DIAGNOSIS — R0989 Other specified symptoms and signs involving the circulatory and respiratory systems: Secondary | ICD-10-CM

## 2012-09-07 DIAGNOSIS — R079 Chest pain, unspecified: Secondary | ICD-10-CM

## 2012-09-07 DIAGNOSIS — R0609 Other forms of dyspnea: Secondary | ICD-10-CM

## 2012-09-07 MED ORDER — ALBUTEROL SULFATE HFA 108 (90 BASE) MCG/ACT IN AERS: 2.0000 | INHALATION_SPRAY | Freq: Four times a day (QID) | RESPIRATORY_TRACT | Status: DC | PRN

## 2012-09-07 NOTE — Patient Instructions (Signed)
I will take another look at your FMLA form and try to make corrections. Next visit, we will discuss what needs to happen to move towards breast reduction surgery.

## 2012-09-11 ENCOUNTER — Telehealth: Payer: Self-pay

## 2012-09-11 NOTE — Telephone Encounter (Signed)
PT STATES DR MCPHERSON WAS GOING TO REDO HER FMLA PAPERS AND HAVE THEM FOR HER. SHE NEEDS THE PAPERS BY TOMORROW PLEASE CALL [phone]

## 2012-09-12 ENCOUNTER — Encounter: Payer: Self-pay | Admitting: Family Medicine

## 2012-09-12 DIAGNOSIS — N62 Hypertrophy of breast: Secondary | ICD-10-CM | POA: Insufficient documentation

## 2012-09-12 NOTE — Progress Notes (Signed)
S: Pt returns for HTN medicationand bronchitis/ cough follow-up which she states is much better. She has been w/o fever/ chills or significant hoarseness. She is resting better at night w/o sleep interruption due to cough. She denies productive cough, SOB, wheezing or palpitations. She still has occasional anxiety and some dyspnea on exertion along with mild chest discomfort. She requests referral to Cardiology be redone as she did not follow through on previous referral. Pt attributes recurrent back pain and chest discomfort to breast size. She would like referral for reduction surgery. Currently, she wears 2 sports bras for support but notes minimal improvement.  She also states her FMLA paperwork needs some amending.  ROS: As per HPI. Negative for HA, dizziness or lightheadedness, numbness, weakness, tremors, syncope.  O:  Filed Vitals:   09/07/12 1555  BP: 124/76                                                Weight up 11 lbs since April  Pulse: 88  Temp: 98.2 F (36.8 C)  Resp: 17   GEN: In NAD; WN,WD. HEENT: [place]/AT; EOMI w/ clear conj/scl. Oroph is normal with mild erythema. NECK: No LAN or TMG. LUNGS: CTA w/o rhonchi or wheezes. COR: RRR. NEURO: A&O x 3; CNs intact. Nonfocal.   A/P: 1. HTN (hypertension) - stable. Continue current medications.  2. Dyspnea on exertion    3. Chest pain on exertion  Ambulatory referral to Cardiology   FMLA paperwork: I will review forms and amend information where appropriate; this was explained to pt.

## 2012-09-28 ENCOUNTER — Ambulatory Visit: Payer: 59 | Admitting: Cardiovascular Disease

## 2012-10-11 ENCOUNTER — Telehealth: Payer: Self-pay

## 2012-10-11 ENCOUNTER — Other Ambulatory Visit: Payer: Self-pay | Admitting: Family Medicine

## 2012-10-11 DIAGNOSIS — R0789 Other chest pain: Secondary | ICD-10-CM

## 2012-10-11 DIAGNOSIS — Z3041 Encounter for surveillance of contraceptive pills: Secondary | ICD-10-CM

## 2012-10-11 MED ORDER — FLUCONAZOLE 150 MG PO TABS: 150.0000 mg | ORAL_TABLET | Freq: Once | ORAL | Status: DC

## 2012-10-11 NOTE — Telephone Encounter (Signed)
Dr Audria Nine   Pt would like rx for yeast infection pill  Also would like referral to be changed to [facility]--she did not want to go to orig place   Pt phone# [phone]  [facility] [address]

## 2012-10-15 MED ORDER — ETONOGESTREL-ETHINYL ESTRADIOL 0.12-0.015 MG/24HR VA RING: VAGINAL_RING | VAGINAL | Status: DC

## 2012-10-15 NOTE — Telephone Encounter (Signed)
She is currently using Nuva ring, please advise.

## 2012-10-15 NOTE — Telephone Encounter (Signed)
Sent to [facility]

## 2012-10-15 NOTE — Telephone Encounter (Signed)
LMOM that we received her request and have sent to pharmacy.

## 2012-10-15 NOTE — Telephone Encounter (Signed)
Dr Audria Nine   Patient is wanting to know if you could call in a rx for birth control.     [facility] [address]   Best#: [phone]

## 2012-10-16 ENCOUNTER — Telehealth: Payer: Self-pay | Admitting: *Deleted

## 2012-10-16 NOTE — Telephone Encounter (Signed)
I only see Nuvaring on her medication list, and it was just refilled.  When did she change?  And who made that change?

## 2012-10-16 NOTE — Telephone Encounter (Signed)
Pt needs refill on BCP, Jovlivette.  She was taken off Nuvaring due to her having HTN.

## 2012-10-17 NOTE — Telephone Encounter (Signed)
LMOM to CB. 

## 2012-10-18 NOTE — Telephone Encounter (Signed)
The patient returned call. The patient stated that she was on the Jovlivette in January of 2013 and she had previously been on it in 2012.  The patient stated she "just wants the medicine called into her pharmacy"  Please call the patient at [phone]

## 2012-10-18 NOTE — Telephone Encounter (Signed)
I reviewed pt's record; I prescribed NUVARING in April 2013 w/ 6 RFs at pt's request. She stated she had been on Jolivette but stopped taking it and resumed NUVARING because she happen to have one. At her visit in November, we did not discuss contraception. I will not refill any more contraception until she has had GYN exam/ status updated (either here or at previous GYN).

## 2012-10-18 NOTE — Telephone Encounter (Signed)
I called [facility] to see when she last was on Jovlivette and who Rxd it. I was told that pt last p/up Jovlivette in Jan/2013 written by Dr Patsy Lager. She didn't get any other BC from them until picking up Nuvaring on 10/14/12. Dr McPherson,did you change her to Jovlivette d/t her BP when you saw her in Nov? I don't see any notes on that, and don't see a PAP lab since 04/15/10 in her paper chart [id number]. Do you want to send in a Rx/RFs of her Jovlivette? I am bringing her chart to 104 for you.

## 2012-10-19 ENCOUNTER — Telehealth: Payer: Self-pay | Admitting: Radiology

## 2012-10-19 ENCOUNTER — Ambulatory Visit: Payer: 59 | Admitting: Cardiology

## 2012-10-19 NOTE — Telephone Encounter (Signed)
See previous phone conversations with patient. She is upset her BCP will not be renewed without an exam. Wants me to have Dr Audria Nine call her. I advised her I will let Dr Audria Nine know she wants phone call.

## 2012-10-19 NOTE — Telephone Encounter (Signed)
Called patient to advise. Left message for her to call me back, so I can discuss.

## 2012-10-19 NOTE — Telephone Encounter (Signed)
Patient advised. She is upset, patient states she can not come in. She states Dr Patsy Lager changed it due to her hypertension. She states she is not due for an exam. I have advised her she is due for this and should make appt. She does not want to schedule

## 2012-10-21 ENCOUNTER — Ambulatory Visit (INDEPENDENT_AMBULATORY_CARE_PROVIDER_SITE_OTHER): Payer: 59 | Admitting: Emergency Medicine

## 2012-10-21 VITALS — BP 128/85 | HR 102 | Temp 98.6°F | Resp 18 | Ht 64.0 in | Wt 240.0 lb

## 2012-10-21 DIAGNOSIS — N912 Amenorrhea, unspecified: Secondary | ICD-10-CM

## 2012-10-21 DIAGNOSIS — Z3041 Encounter for surveillance of contraceptive pills: Secondary | ICD-10-CM

## 2012-10-21 DIAGNOSIS — K089 Disorder of teeth and supporting structures, unspecified: Secondary | ICD-10-CM

## 2012-10-21 DIAGNOSIS — Z30011 Encounter for initial prescription of contraceptive pills: Secondary | ICD-10-CM

## 2012-10-21 LAB — POCT URINE PREGNANCY
Preg Test, Ur: NEGATIVE
Preg Test, Ur: NEGATIVE

## 2012-10-21 MED ORDER — NORETHINDRONE 0.35 MG PO TABS: 1.0000 | ORAL_TABLET | Freq: Every day | ORAL | Status: DC

## 2012-10-21 MED ORDER — FLUCONAZOLE 150 MG PO TABS: 150.0000 mg | ORAL_TABLET | Freq: Once | ORAL | Status: DC

## 2012-10-21 MED ORDER — HYDROCODONE-ACETAMINOPHEN 5-325 MG PO TABS: 1.0000 | ORAL_TABLET | Freq: Four times a day (QID) | ORAL | Status: DC | PRN

## 2012-10-21 NOTE — Progress Notes (Signed)
  Subjective:    Patient ID: Brooke Turner, female    DOB: July 24, 1976, 36 y.o.   MRN: [id number]  HPI    Review of Systems     Objective:   Physical Exam        Assessment & Plan:

## 2012-10-21 NOTE — Progress Notes (Signed)
  Subjective:    Patient ID: Brooke Turner, female    DOB: [birth year], 36 y.o.   MRN: [id number]  HPI problem #1 severe pain in the tooth right upper molar where she is due to have a root canal. Problem #2 is birth control therapy. She has a history of hypertension and is currently seeing [facility] for evaluation she was prescribed Micronor but did not take this medication. Problem #3 is a rash on her neck. She is concerned about pigmented area on the back of her neck. She was told about this by the cardiologist she saw the. Problem number four  is a yeast infection. She's been bothered with a yeast vaginitis and she's been taking clindamycin for her infected tooth she is also requesting medication for pain for the tooth    Review of Systems     Objective:   Physical Exam HEENT exam is unremarkable except for acanthosis migrans of her in her neck Chest is clear heart regular rate no murmurs abdomen soft nontender  Results for orders placed in visit on 07/13/12  VITAMIN D 25 HYDROXY      Component Value Range   Vit D, 25-Hydroxy 23 (*) 30 - 89 ng/mL  HIV ANTIBODY (ROUTINE TESTING)      Component Value Range   HIV NON REACTIVE  NON REACTIVE  RPR      Component Value Range   RPR NON REAC  NON REAC  HEMOGLOBINOPATHY EVALUATION      Component Value Range   Hgb A 97.6  96.8 - 97.8 %   Hgb A2 Quant 2.4  2.2 - 3.2 %   Hgb F Quant 0.0  0.0 - 2.0 %   Hgb S Quant 0.0  0.0 %   Hemoglobin Other 0.0  0.0 %       Assessment & Plan:   I wrote the patient for #20 Norco 5 to have for her dental pain. I gave her a prescription for Diflucan to take for her yeast infection. I gave her a prescription for one pack of Micronor with one refill until she can followup with Dr. Audria Nine.. I also gave her some triamcinolone cream to use on her rash on the back of her neck .

## 2012-10-25 ENCOUNTER — Telehealth: Payer: Self-pay | Admitting: *Deleted

## 2012-10-25 NOTE — Telephone Encounter (Signed)
Left message for patient to call back concerning FMLA papers.

## 2012-10-26 ENCOUNTER — Telehealth: Payer: Self-pay | Admitting: *Deleted

## 2012-10-26 NOTE — Telephone Encounter (Signed)
Patient wants to have the options for office visits 2 times every 3 months because of the HTN AND ANXIETY problems.  Also, the chest pains are increasing more often.  On 10/19/2012, she went to [facility] for the appointment  And she was advised to get a stress test for further evaluation. She stated she has not heard from [facility], I told her to call their office and check with them.

## 2012-10-26 NOTE — Telephone Encounter (Signed)
Patient returned call from 10/25/2012, she wants to have the options

## 2012-11-05 ENCOUNTER — Other Ambulatory Visit (HOSPITAL_COMMUNITY): Payer: Self-pay | Admitting: Physician Assistant

## 2012-11-05 DIAGNOSIS — I10 Essential (primary) hypertension: Secondary | ICD-10-CM

## 2012-11-05 DIAGNOSIS — F419 Anxiety disorder, unspecified: Secondary | ICD-10-CM

## 2012-11-05 DIAGNOSIS — E669 Obesity, unspecified: Secondary | ICD-10-CM

## 2012-11-05 DIAGNOSIS — R079 Chest pain, unspecified: Secondary | ICD-10-CM

## 2012-11-09 ENCOUNTER — Telehealth: Payer: Self-pay

## 2012-11-09 NOTE — Telephone Encounter (Signed)
Dr Audria Nine, also see phone message from 10/26/12. I'm not sure that you ever received this previous message, I don't see that it was routed to you.

## 2012-11-09 NOTE — Telephone Encounter (Signed)
Dr. Eyvonne Mechanic states she asked someone here a couple of weeks ago to pass on a message to you that she needs her FMLA paperwork updated to have 2 office visits every 3 months instead of 1.  Right now her job does not permit time off without documentation.  She would like you to update and initial this and fax it to Rosann Auerbach (she states the number is on paperwork we should have)  Best # [phone]

## 2012-11-11 ENCOUNTER — Telehealth: Payer: Self-pay | Admitting: Family Medicine

## 2012-11-11 NOTE — Telephone Encounter (Signed)
Re: FMLA paperwork- pt has requested several changes be made to the forms. I have honored one request but pt is wanting absences from work covered that  I do not think are warranted. I left a message stating that I would like to discuss her request and try to clarify what is appropriate given her current health status and ongoing evaluation with the Cardiology specialist. I have advised that she consider treatment for anxiety in the past but she declines this; she is requesting 2 visits every 3 months to address this issue. The appropriate course of action would be to have the anxiety addressed and treated such that she would not have to miss days from work with this condition.  I will attempt to contact pt tomorrow to discuss her request.

## 2012-11-16 ENCOUNTER — Encounter (HOSPITAL_COMMUNITY): Payer: 59

## 2012-11-21 ENCOUNTER — Encounter: Payer: Self-pay | Admitting: Cardiology

## 2012-12-07 ENCOUNTER — Telehealth: Payer: Self-pay

## 2012-12-07 ENCOUNTER — Other Ambulatory Visit: Payer: Self-pay | Admitting: Radiology

## 2012-12-07 MED ORDER — NORETHINDRONE 0.35 MG PO TABS: 1.0000 | ORAL_TABLET | Freq: Every day | ORAL | Status: DC

## 2012-12-07 NOTE — Telephone Encounter (Signed)
E rx did not go / was faxed.

## 2012-12-07 NOTE — Telephone Encounter (Signed)
I have sent the OCPs that Dr. Cleta Alberts prescribed in December.  I have only sent a one month supply because she needs to discuss with Dr. Audria Nine at her Feb appt

## 2012-12-07 NOTE — Telephone Encounter (Signed)
Thank you. Patient aware.

## 2012-12-07 NOTE — Telephone Encounter (Signed)
Dr Cleta Alberts states he gave her 1 month until she can get in to see Dr Audria Nine. Her appt with Dr Audria Nine is scheduled for 12/20/12 please advise. However review first, because I see the OCP Dr Cleta Alberts prescribed, also see nuvaring, unsure which she should use, or why it was changed.

## 2012-12-07 NOTE — Telephone Encounter (Signed)
Patient is needing a refill on her BCP she would like to have what dr Brooke Turner gave her.   [facility] [address]  PH#: [phone]

## 2012-12-14 ENCOUNTER — Ambulatory Visit: Payer: 59 | Admitting: Family Medicine

## 2012-12-21 ENCOUNTER — Ambulatory Visit: Payer: 59 | Admitting: Family Medicine

## 2013-01-17 ENCOUNTER — Other Ambulatory Visit: Payer: Self-pay | Admitting: Family Medicine

## 2013-03-29 ENCOUNTER — Ambulatory Visit (INDEPENDENT_AMBULATORY_CARE_PROVIDER_SITE_OTHER): Payer: 59 | Admitting: Family Medicine

## 2013-03-29 VITALS — BP 120/88 | HR 86 | Temp 99.5°F | Resp 16 | Ht 63.0 in | Wt 243.0 lb

## 2013-03-29 DIAGNOSIS — J029 Acute pharyngitis, unspecified: Secondary | ICD-10-CM

## 2013-03-29 DIAGNOSIS — Z309 Encounter for contraceptive management, unspecified: Secondary | ICD-10-CM

## 2013-03-29 DIAGNOSIS — IMO0001 Reserved for inherently not codable concepts without codable children: Secondary | ICD-10-CM

## 2013-03-29 DIAGNOSIS — I10 Essential (primary) hypertension: Secondary | ICD-10-CM

## 2013-03-29 LAB — POCT RAPID STREP A (OFFICE): Rapid Strep A Screen: NEGATIVE

## 2013-03-29 MED ORDER — NORETHINDRONE 0.35 MG PO TABS: 1.0000 | ORAL_TABLET | Freq: Every day | ORAL | Status: DC

## 2013-03-29 MED ORDER — AZITHROMYCIN 250 MG PO TABS: ORAL_TABLET | ORAL | Status: DC

## 2013-03-29 NOTE — Progress Notes (Addendum)
[facility] [address], [place] [address] [phone]  Date:  03/29/2013   Name:  Brooke Turner   DOB:  Jul 08, 1976   MRN:  [id number]  PCP:  No primary provider on file.    Chief Complaint: Contraception and Sore Throat   History of Present Illness:  Brooke Turner is a 37 y.o. very pleasant female patient who presents with the following:  She is concerned that she might be getting strep throat.  She has had a bad ST for one day.  She has also noted aches, chills and fatigue.  She does not have a cough.  She does have copious sinus drainage.  She had a temperature today of 99.? She also needs a refill of her OCP.   She does not have a history of abnormal pap, no new sexual partners.  She does take HTN medications.   She has quit smoking except she does admit to having an occasional cigarette when out with her friends.   Her LMP was last week.  She is on progesterone only OCP due to her HTN and tobacco use.   Brooke Turner does not wish to have a pap today   Patient Active Problem List   Diagnosis Date Noted  . Large breasts 09/12/2012  . HTN (hypertension) 02/17/2012  . Contraception management 02/17/2012  . Obesity, Class III, BMI 40-49.9 (morbid obesity) 02/17/2012  . Anxiety disorder 02/17/2012  . Chronic back pain 02/17/2012    Past Medical History  Diagnosis Date  . Hypertension   . GERD (gastroesophageal reflux disease)     History reviewed. No pertinent past surgical history.  History  Substance Use Topics  . Smoking status: Former Smoker    Types: Cigarettes    Quit date: 08/07/2012  . Smokeless tobacco: Never Used     Comment: Maybe six cigarerres a week  . Alcohol Use: Yes     Comment: occassionally    Family History  Problem Relation Age of Onset  . Seizures Mother   . Cancer Father     Liver  . Diabetes Father 84  . Hypertension Father   . Heart disease Father     CEA, LE Stenting  . Alzheimer's disease  Maternal Grandmother   . Heart disease Maternal Grandfather     Allergies  Allergen Reactions  . Penicillins Hives    Medication list has been reviewed and updated.  Current Outpatient Prescriptions on File Prior to Visit  Medication Sig Dispense Refill  . albuterol (PROVENTIL HFA;VENTOLIN HFA) 108 (90 BASE) MCG/ACT inhaler Inhale 2 puffs into the lungs every 6 (six) hours as needed.  1 Inhaler  3  . atenolol-chlorthalidone (TENORETIC 50) 50-25 MG per tablet Take 1/2 - 1 tablet every day to lower BP.  30 tablet  3  . ipratropium (ATROVENT) 0.03 % nasal spray Place 2 sprays into the nose 2 (two) times daily.  30 mL  5  . lisinopril (PRINIVIL,ZESTRIL) 20 MG tablet take 1 tablet by mouth once daily  90 tablet  0  . LORazepam (ATIVAN) 0.5 MG tablet Take 1 tablet (0.5 mg total) by mouth daily as needed.  30 tablet  0  . norethindrone (ORTHO MICRONOR) 0.35 MG tablet Take 1 tablet (0.35 mg total) by mouth daily.  1 Package  0  . famotidine (PEPCID) 20 MG tablet Take 1 tablet (20 mg total) by mouth 2 (two) times daily.  60 tablet  2   No current facility-administered medications on  file prior to visit.    Review of Systems:  As per HPI- otherwise negative.   Physical Examination: Filed Vitals:   03/29/13 1728  BP: 120/88  Pulse: 86  Temp: 99.5 F (37.5 C)  Resp: 16   Filed Vitals:   03/29/13 1728  Height: 5\' 3"  (1.6 m)  Weight: 243 lb (110.224 kg)   Body mass index is 43.06 kg/(m^2). Ideal Body Weight: Weight in (lb) to have BMI = 25: 140.8  GEN: WDWN, NAD, Non-toxic, A & O x 3 HEENT: Atraumatic, Normocephalic. Neck supple. No masses, No LAD.  Bilateral TM wnl, oropharynx slightly injected but no exudate.  PEERL,EOMI.   Ears and Nose: No external deformity. CV: RRR, No M/G/R. No JVD. No thrill. No extra heart sounds. PULM: CTA B, no wheezes, crackles, rhonchi. No retractions. No resp. distress. No accessory muscle use. ABD: S, NT, ND. No rebound. No HSM. EXTR: No  c/c/e NEURO Normal gait.  PSYCH: Normally interactive. Conversant. Not depressed or anxious appearing.  Calm demeanor.   Results for orders placed in visit on 03/29/13  POCT RAPID STREP A (OFFICE)      Result Value Range   Rapid Strep A Screen Negative  Negative    Assessment and Plan: Acute pharyngitis - Plan: POCT rapid strep A, Culture, Group A Strep, azithromycin (ZITHROMAX) 250 MG tablet  Contraception - Plan: norethindrone (ORTHO MICRONOR) 0.35 MG tablet  Treat for possible strep with azithromycin (penicillin allergy).  Cautioned regarding possible inactivation of her OCP.   Plan to do a pap in the next few months.    Signed Abbe Amsterdam, MD  5/24- called and LMOM.  She has not had a BMP since she started the chlorthalidone lasst fall- it would be smart to check this.  Asked her to please stop by in the next few days for a lab visit only

## 2013-03-29 NOTE — Patient Instructions (Addendum)
We are going to treat you for possible strep throat.  Use the azithromycin as directed. If you are not better in the next few days please let me know- Sooner if worse.   Please come and see [place] for a pap in the next 6- 9 months.

## 2013-03-30 NOTE — Addendum Note (Signed)
Addended by: Abbe Amsterdam C on: 03/30/2013 03:41 PM   Modules accepted: Orders

## 2013-03-31 LAB — CULTURE, GROUP A STREP: Organism ID, Bacteria: NORMAL

## 2013-04-02 ENCOUNTER — Encounter: Payer: Self-pay | Admitting: Family Medicine

## 2013-04-12 ENCOUNTER — Other Ambulatory Visit: Payer: Self-pay | Admitting: Family Medicine

## 2013-04-12 NOTE — Telephone Encounter (Signed)
Dr Patsy Lager, do you want to RF pt's BP med? You just saw her but then your note said that you asked pt to come in for BMP only. I don't see where she has done this - under Labs it says "cancelled"?

## 2013-04-13 ENCOUNTER — Other Ambulatory Visit: Payer: Self-pay | Admitting: Family Medicine

## 2013-04-21 ENCOUNTER — Ambulatory Visit (INDEPENDENT_AMBULATORY_CARE_PROVIDER_SITE_OTHER): Payer: 59 | Admitting: Emergency Medicine

## 2013-04-21 ENCOUNTER — Ambulatory Visit: Payer: 59

## 2013-04-21 VITALS — BP 162/80 | HR 102 | Temp 99.2°F | Resp 18 | Ht 63.0 in | Wt 246.0 lb

## 2013-04-21 DIAGNOSIS — M549 Dorsalgia, unspecified: Secondary | ICD-10-CM

## 2013-04-21 DIAGNOSIS — I10 Essential (primary) hypertension: Secondary | ICD-10-CM

## 2013-04-21 DIAGNOSIS — R0789 Other chest pain: Secondary | ICD-10-CM

## 2013-04-21 LAB — POCT CBC
Granulocyte percent: 57.2 %G (ref 37–80)
HCT, POC: 40.9 % (ref 37.7–47.9)
Hemoglobin: 13.2 g/dL (ref 12.2–16.2)
Lymph, poc: 4 — AB (ref 0.6–3.4)
MCH, POC: 30.1 pg (ref 27–31.2)
MCHC: 32.3 g/dL (ref 31.8–35.4)
MCV: 93.3 fL (ref 80–97)
MID (cbc): 0.9 (ref 0–0.9)
MPV: 7.8 fL (ref 0–99.8)
POC Granulocyte: 6.6 (ref 2–6.9)
POC LYMPH PERCENT: 34.7 %L (ref 10–50)
POC MID %: 8.1 %M (ref 0–12)
Platelet Count, POC: 320 10*3/uL (ref 142–424)
RBC: 4.38 M/uL (ref 4.04–5.48)
RDW, POC: 14.1 %
WBC: 11.5 10*3/uL — AB (ref 4.6–10.2)

## 2013-04-21 MED ORDER — CYCLOBENZAPRINE HCL 10 MG PO TABS: 10.0000 mg | ORAL_TABLET | Freq: Three times a day (TID) | ORAL | Status: DC | PRN

## 2013-04-21 MED ORDER — NAPROXEN SODIUM 550 MG PO TABS: 550.0000 mg | ORAL_TABLET | Freq: Two times a day (BID) | ORAL | Status: DC

## 2013-04-21 NOTE — Progress Notes (Signed)
[facility] [address] [place] [address] [phone]  Date:  04/21/2013   Name:  Brooke Turner   DOB:  [birth year]   MRN:  [id number]  PCP:  No PCP Per Patient    Chief Complaint: Hypertension, Bloodwork and Back Pain   History of Present Illness:  Brooke Turner is a 37 y.o. very pleasant female patient who presents with the following:  Says that she was sitting down and took her antihypertensive.  When she got up, she developed rather impressive dizziness.  Says that she drove to the office despite the dizziness. Prior episode of chest pain and dizziness treated in the ER with ativan.  Says she is rather pressured and anxious. She has "tightness" in her face and has no shortness of breath.  Thinks she had some sensation of rapid heart beat.  No fever, chills, nausea or vomiting. No stool change, cough or rash.  No wheezing or shortness of breath.  Eating and drinking normally.   Larey Seat on the stairs and injured her low back 2 years ago and has persistent LBP.  No neuro symptoms or radiation.  Has increase in pain with walking forcing her to sit and rest.    Patient Active Problem List   Diagnosis Date Noted  . Large breasts 09/12/2012  . HTN (hypertension) 02/17/2012  . Contraception management 02/17/2012  . Obesity, Class III, BMI 40-49.9 (morbid obesity) 02/17/2012  . Anxiety disorder 02/17/2012  . Chronic back pain 02/17/2012    Past Medical History  Diagnosis Date  . Hypertension   . GERD (gastroesophageal reflux disease)     No past surgical history on file.  History  Substance Use Topics  . Smoking status: Former Smoker    Types: Cigarettes    Quit date: 08/07/2012  . Smokeless tobacco: Never Used     Comment: Maybe six cigarerres a week  . Alcohol Use: Yes     Comment: occassionally    Family History  Problem Relation Age of Onset  . Seizures Mother   . Cancer Father     Liver  . Diabetes Father 74  . Hypertension  Father   . Heart disease Father     CEA, LE Stenting  . Alzheimer's disease Maternal Grandmother   . Heart disease Maternal Grandfather     Allergies  Allergen Reactions  . Penicillins Hives    Medication list has been reviewed and updated.  Current Outpatient Prescriptions on File Prior to Visit  Medication Sig Dispense Refill  . atenolol-chlorthalidone (TENORETIC) 50-25 MG per tablet Take 1/2 to 1 tablet daily for blood pressure.  Please come in for labs soon  30 tablet  1  . ipratropium (ATROVENT) 0.03 % nasal spray Place 2 sprays into the nose 2 (two) times daily.  30 mL  5  . lisinopril (PRINIVIL,ZESTRIL) 20 MG tablet take 1 tablet by mouth once daily  90 tablet  0  . LORazepam (ATIVAN) 0.5 MG tablet Take 1 tablet (0.5 mg total) by mouth daily as needed.  30 tablet  0  . albuterol (PROVENTIL HFA;VENTOLIN HFA) 108 (90 BASE) MCG/ACT inhaler Inhale 2 puffs into the lungs every 6 (six) hours as needed.  1 Inhaler  3  . norethindrone (ORTHO MICRONOR) 0.35 MG tablet Take 1 tablet (0.35 mg total) by mouth daily.  3 Package  3   No current facility-administered medications on file prior to visit.    Review of Systems:  As per HPI, otherwise  negative.    Physical Examination: Filed Vitals:   04/21/13 1515  BP: 162/80  Pulse: 102  Temp: 99.2 F (37.3 C)  Resp: 18   Filed Vitals:   04/21/13 1515  Height: 5\' 3"  (1.6 m)  Weight: 246 lb (111.585 kg)   Body mass index is 43.59 kg/(m^2). Ideal Body Weight: Weight in (lb) to have BMI = 25: 140.8  GEN: morbid obesity, NAD, Non-toxic, A & O x 3 HEENT: Atraumatic, Normocephalic. Neck supple. No masses, No LAD. Ears and Nose: No external deformity. CV: RRR, No M/G/R. No JVD. No thrill. No extra heart sounds. PULM: CTA B, no wheezes, crackles, rhonchi. No retractions. No resp. distress. No accessory muscle use. ABD: S, NT, ND, +BS. No rebound. No HSM. EXTR: No c/c/e NEURO Normal gait.  PSYCH: Normally interactive. Conversant.  Not depressed or anxious appearing.  Calm demeanor.    Assessment and Plan: Back pain Anxiety reaction Hypertension Smokes Labs pending EKG normal Stop smoking Anaprox Flexeril   Signed,  Phillips Odor, MD   [facility] reading (PRIMARY) by  Dr. Dareen Piano.  No osseous injury.

## 2013-04-21 NOTE — Patient Instructions (Addendum)
Calorie Counting Diet A calorie counting diet requires you to eat the number of calories that are right for you in a day. Calories are the measurement of how much energy you get from the food you eat. Eating the right amount of calories is important for staying at a healthy weight. If you eat too many calories, your body will store them as fat and you may gain weight. If you eat too few calories, you may lose weight. Counting the number of calories you eat during a day will help you know if you are eating the right amount. A Registered Dietitian can determine how many calories you need in a day. The amount of calories needed varies from person to person. If your goal is to lose weight, you will need to eat fewer calories. Losing weight can benefit you if you are overweight or have health problems such as heart disease, high blood pressure, or diabetes. If your goal is to gain weight, you will need to eat more calories. Gaining weight may be necessary if you have a certain health problem that causes your body to need more energy. TIPS Whether you are increasing or decreasing the number of calories you eat during a day, it may be hard to get used to changes in what you eat and drink. The following are tips to help you keep track of the number of calories you eat.  Measure foods at home with measuring cups. This helps you know the amount of food and number of calories you are eating.  Restaurants often serve food in amounts that are larger than 1 serving. While eating out, estimate how many servings of a food you are given. For example, a serving of cooked rice is  cup or about the size of half of a fist. Knowing serving sizes will help you be aware of how much food you are eating at restaurants.  Ask for smaller portion sizes or child-size portions at restaurants.  Plan to eat half of a meal at a restaurant. Take the rest home or share the other half with a friend.  Read the Nutrition Facts panel on  food labels for calorie content and serving size. You can find out how many servings are in a package, the size of a serving, and the number of calories each serving has.  For example, a package might contain 3 cookies. The Nutrition Facts panel on that package says that 1 serving is 1 cookie. Below that, it will say there are 3 servings in the container. The calories section of the Nutrition Facts label says there are 90 calories. This means there are 90 calories in 1 cookie (1 serving). If you eat 1 cookie you have eaten 90 calories. If you eat all 3 cookies, you have eaten 270 calories (3 servings x 90 calories = 270 calories). The list below tells you how big or small some common portion sizes are.  1 oz.........4 stacked dice.  3 oz.........Deck of cards.  1 tsp........Tip of little finger.  1 tbs........Thumb.  2 tbs........Golf ball.   cup.......Half of a fist.  1 cup........A fist. KEEP A FOOD LOG Write down every food item you eat, the amount you eat, and the number of calories in each food you eat during the day. At the end of the day, you can add up the total number of calories you have eaten. It may help to keep a list like the one below. Find out the calorie information by reading the   Nutrition Facts panel on food labels. Breakfast  Bran cereal (1 cup, 110 calories).  Fat-free milk ( cup, 45 calories). Snack  Apple (1 medium, 80 calories). Lunch  Spinach (1 cup, 20 calories).  Tomato ( medium, 20 calories).  Chicken breast strips (3 oz, 165 calories).  Shredded cheddar cheese ( cup, 110 calories).  Light Italian dressing (2 tbs, 60 calories).  Whole-wheat bread (1 slice, 80 calories).  Tub margarine (1 tsp, 35 calories).  Vegetable soup (1 cup, 160 calories). Dinner  Pork chop (3 oz, 190 calories).  Brown rice (1 cup, 215 calories).  Steamed broccoli ( cup, 20 calories).  Strawberries (1  cup, 65 calories).  Whipped cream (1 tbs, 50  calories). Daily Calorie Total: 1425 Document Released: 10/24/2005 Document Revised: 01/16/2012 Document Reviewed: 04/20/2007 [employer] Patient Information 2014 [employer].  

## 2013-04-22 LAB — COMPREHENSIVE METABOLIC PANEL
ALT: 19 U/L (ref 0–35)
AST: 20 U/L (ref 0–37)
Albumin: 4 g/dL (ref 3.5–5.2)
Alkaline Phosphatase: 89 U/L (ref 39–117)
BUN: 13 mg/dL (ref 6–23)
CO2: 24 mEq/L (ref 19–32)
Calcium: 9.1 mg/dL (ref 8.4–10.5)
Chloride: 101 mEq/L (ref 96–112)
Creat: 0.98 mg/dL (ref 0.50–1.10)
Glucose, Bld: 120 mg/dL — ABNORMAL HIGH (ref 70–99)
Potassium: 3.6 mEq/L (ref 3.5–5.3)
Sodium: 137 mEq/L (ref 135–145)
Total Bilirubin: 0.5 mg/dL (ref 0.3–1.2)
Total Protein: 7 g/dL (ref 6.0–8.3)

## 2013-04-24 NOTE — Progress Notes (Signed)
Left msg for pt to schedule future appt.

## 2013-04-26 NOTE — Progress Notes (Signed)
Sent pt reminder letter to schedule follow up appointment.

## 2013-04-29 ENCOUNTER — Other Ambulatory Visit: Payer: Self-pay | Admitting: Physician Assistant

## 2013-05-28 ENCOUNTER — Other Ambulatory Visit: Payer: Self-pay | Admitting: Physician Assistant

## 2013-05-28 ENCOUNTER — Telehealth: Payer: Self-pay

## 2013-05-28 DIAGNOSIS — F419 Anxiety disorder, unspecified: Secondary | ICD-10-CM

## 2013-05-28 NOTE — Telephone Encounter (Signed)
Pt is calling to request a refill on ativan, she states that the pharmacy sent over a request for the lisinopril but were unable to send over a request for the ativan.  Best# [phone]

## 2013-05-29 NOTE — Telephone Encounter (Signed)
Please advise 

## 2013-05-30 NOTE — Telephone Encounter (Signed)
Please advise 

## 2013-05-30 NOTE — Telephone Encounter (Signed)
Pt called to check on status of previous message. Please call pt  Best: [phone] bf

## 2013-06-05 MED ORDER — LORAZEPAM 0.5 MG PO TABS: 0.5000 mg | ORAL_TABLET | Freq: Every day | ORAL | Status: DC | PRN

## 2013-06-05 NOTE — Telephone Encounter (Signed)
I called in refill for Lorazepam (Ativan) to pt's pharmacy this morning.

## 2013-06-29 ENCOUNTER — Other Ambulatory Visit: Payer: Self-pay | Admitting: Physician Assistant

## 2013-06-30 ENCOUNTER — Other Ambulatory Visit: Payer: Self-pay | Admitting: Physician Assistant

## 2013-06-30 ENCOUNTER — Telehealth: Payer: Self-pay

## 2013-06-30 NOTE — Telephone Encounter (Signed)
Pt states that she is currently out of her lisinopril and would like to know the status of the refill request that was sent over. Best# [phone]

## 2013-07-01 ENCOUNTER — Telehealth: Payer: Self-pay

## 2013-07-01 MED ORDER — LISINOPRIL 20 MG PO TABS: 20.0000 mg | ORAL_TABLET | Freq: Every day | ORAL | Status: DC

## 2013-07-01 NOTE — Telephone Encounter (Signed)
When she was seen in June for Chest pain her BP was NOT controlled and she was advised to make an appt for this. She has not. She was also seen for a sore throat, she needs evaluation for her BP. Called her left message for her to call me back so I can advise.

## 2013-07-01 NOTE — Telephone Encounter (Signed)
This Rx is in Rx pool.

## 2013-07-01 NOTE — Telephone Encounter (Signed)
Yes, she needs and OV for recheck of BP since it was not controlled at last OV 2 months ago

## 2013-07-01 NOTE — Telephone Encounter (Signed)
[phone] patient says we only gave her 15 blood pressure pills and she has been her 2 times in that last 3 months and also she has never heard anything from her labs results done here the last time she had a visit please call her

## 2013-07-01 NOTE — Telephone Encounter (Signed)
Sent in

## 2013-07-02 NOTE — Telephone Encounter (Signed)
Thanks, left another message for her to call me back.

## 2013-07-04 NOTE — Telephone Encounter (Signed)
Patient not responding to phone calls, have sent letter.

## 2013-07-05 ENCOUNTER — Telehealth: Payer: Self-pay

## 2013-07-05 NOTE — Telephone Encounter (Signed)
Patient calling to speak to someone to ask why she needs an office visit. She also states she never got a call back or anything in the mail for her lab results in July. Please advise.  [phone] (area called is confirmed that it is [phone] [place] number

## 2013-07-05 NOTE — Telephone Encounter (Signed)
Pt was called by Amy and was told that she needed to come back in the office to be seen but she doesn't know for what Call back number is [phone]

## 2013-07-06 NOTE — Telephone Encounter (Signed)
lmom to cb. 

## 2013-07-10 NOTE — Telephone Encounter (Signed)
Patient has been advised to come in previously, she is now not returning calls. Mailed her labs to her from June.

## 2013-07-15 ENCOUNTER — Ambulatory Visit (INDEPENDENT_AMBULATORY_CARE_PROVIDER_SITE_OTHER): Payer: 59 | Admitting: Family Medicine

## 2013-07-15 VITALS — BP 124/84 | HR 69 | Temp 99.5°F | Resp 16 | Ht 64.0 in | Wt 242.0 lb

## 2013-07-15 DIAGNOSIS — Z309 Encounter for contraceptive management, unspecified: Secondary | ICD-10-CM

## 2013-07-15 DIAGNOSIS — E559 Vitamin D deficiency, unspecified: Secondary | ICD-10-CM

## 2013-07-15 DIAGNOSIS — R42 Dizziness and giddiness: Secondary | ICD-10-CM

## 2013-07-15 DIAGNOSIS — H6590 Unspecified nonsuppurative otitis media, unspecified ear: Secondary | ICD-10-CM

## 2013-07-15 DIAGNOSIS — R079 Chest pain, unspecified: Secondary | ICD-10-CM

## 2013-07-15 DIAGNOSIS — I10 Essential (primary) hypertension: Secondary | ICD-10-CM

## 2013-07-15 DIAGNOSIS — E119 Type 2 diabetes mellitus without complications: Secondary | ICD-10-CM | POA: Insufficient documentation

## 2013-07-15 LAB — POCT CBC
Granulocyte percent: 47.1 %G (ref 37–80)
HCT, POC: 42.4 % (ref 37.7–47.9)
Hemoglobin: 13.6 g/dL (ref 12.2–16.2)
Lymph, poc: 5.1 — AB (ref 0.6–3.4)
MCH, POC: 29.8 pg (ref 27–31.2)
MCHC: 32.1 g/dL (ref 31.8–35.4)
MCV: 92.7 fL (ref 80–97)
MID (cbc): 0.8 (ref 0–0.9)
MPV: 8.2 fL (ref 0–99.8)
POC Granulocyte: 5.3 (ref 2–6.9)
POC LYMPH PERCENT: 45.5 %L (ref 10–50)
POC MID %: 7.4 %M (ref 0–12)
Platelet Count, POC: 334 10*3/uL (ref 142–424)
RBC: 4.57 M/uL (ref 4.04–5.48)
RDW, POC: 14.8 %
WBC: 11.3 10*3/uL — AB (ref 4.6–10.2)

## 2013-07-15 LAB — POCT URINE PREGNANCY: Preg Test, Ur: NEGATIVE

## 2013-07-15 LAB — GLUCOSE, POCT (MANUAL RESULT ENTRY): POC Glucose: 98 mg/dl (ref 70–99)

## 2013-07-15 LAB — POCT GLYCOSYLATED HEMOGLOBIN (HGB A1C): Hemoglobin A1C: 6.9

## 2013-07-15 MED ORDER — FLUTICASONE PROPIONATE 50 MCG/ACT NA SUSP: 2.0000 | Freq: Every day | NASAL | Status: DC

## 2013-07-15 MED ORDER — CETIRIZINE HCL 10 MG PO TABS: 10.0000 mg | ORAL_TABLET | Freq: Every day | ORAL | Status: DC

## 2013-07-15 MED ORDER — LISINOPRIL 20 MG PO TABS: 20.0000 mg | ORAL_TABLET | Freq: Every day | ORAL | Status: DC

## 2013-07-15 NOTE — Progress Notes (Signed)
Subjective:    Patient ID: Brooke Turner, female    DOB: 1975/12/04, 37 y.o.   MRN: [id number] Chief Complaint  Patient presents with  . Medication Refill    lisinopril   HPI  Had biometric screening at work a few months ago. And had a really low hdl though trig and ldl was excellent. However, lipid panel has always been fine in the past so is really concerning for her. Has been really trying to build up HDL through diet.  Really focused on trying to eat a lot of fish and nuts. Wonders if she could just take a vitamin supp instead.  Sugar was also a little elev at the screening so would like rechecked.  HR used to be 90s-100s but now HR is in high 80s since starting atenolol.  Not feeling well today. Really fatigued today.  Occ gets really dizzy when she lays down - gets really lightheaded, presyncopal so jumps up and does something. When she relaxes she gets spacey and presyncopal, chills.  Finally she fell asleep around 3 a.m. And really nauseas last night.  No appetite today. Having a lot of pressure around face - feels tight, thinks she has some sinus problems.  The first time it happened was around 06/2011 and went to ER and attributed to anxiety which she disagrees with. Now increasing in freq about once a month. Now was just seen last mo.  Has a ton of congestion in her throat whenever she wakes up in the morning.  Concerned about tightness on the side of her neck.  Worried it could be a narrowed carotic artery like her dad.  No anxiety or panic. Takes 1/2 tab ativan as needed when face feels tight if she feels anxious or funny. Often takes 1/2 tab of ativan at night for about a wk and then will be able to go off for a while. Initially rx'ed when she was evaluated in the ER for CP in 8/12 and then again dizziness/presyncope sxs were attributed to anxiety at prior eval here. She does not think it is due to anxiety but doesn't know what else to do for the sxs other than take an ativan.  Does help a little. Helps her sleep.  Very concerned about her health and gets really concerned about her family history, wants to catch it early - father had "hardening of the carotic arteries, live cancr, diabetes, htn" mother has "epilepsy and th alessemia, mgm with alzheimes, mgf with stroke and CHF, pgm with DM. Has left upper chest pain more frequently and lays on left side hurts more and more back pain - coming more from fall down stairs in July 2012.  Continue to have pain but worsening, xrays were normal.  But pain is worsening so concerned she could have had muscle or nerve damage. Used to be a running and trying to run more so good at pushing through pain.  Wants a back brace, doesn't want to take a lot of medicine and was prescribed pain medicine for this (naproxen and flexeril).   Went for an initial consultation to heart and vascular for EKG and was set up for a stress test.  Wants to get another referral to heart and vascular.   Has been trying to run and walk more. Has been watching diet but does have a sweet tooth and does not watch carbs. Likes fruits. Tries to drink a lot of gatorade though loves sodas. Does not like tea or diet drinks, hates crystal  light and doesn't like to just drink water.   Review of Systems  Constitutional: Positive for activity change, appetite change and unexpected weight change.  HENT: Positive for facial swelling, neck stiffness, postnasal drip and sinus pressure.   Respiratory: Positive for chest tightness.   Cardiovascular: Positive for chest pain.  Endocrine: Negative for polydipsia, polyphagia and polyuria.  Neurological: Positive for facial asymmetry.      BP 124/84  Pulse 69  Temp(Src) 99.5 F (37.5 C) (Oral)  Resp 16  Ht 5\' 4"  (1.626 m)  Wt 242 lb (109.77 kg)  BMI 41.52 kg/m2  SpO2 100%  LMP 06/24/2013 Objective:   Physical Exam  Constitutional: She is oriented to person, place, and time. She appears well-developed and  well-nourished. No distress.  HENT:  Head: Normocephalic and atraumatic.  Right Ear: Tympanic membrane, external ear and ear canal normal.  Left Ear: External ear and ear canal normal. A middle ear effusion is present.  Nose: Nose normal. No mucosal edema or rhinorrhea.  Mouth/Throat: Uvula is midline, oropharynx is clear and moist and mucous membranes are normal. No oropharyngeal exudate.  Eyes: Conjunctivae are normal. Right eye exhibits no discharge. Left eye exhibits no discharge. No scleral icterus.  Neck: Trachea normal and normal range of motion. Neck supple. Normal carotid pulses present. Carotid bruit is not present. No mass and no thyromegaly present.  Cardiovascular: Normal rate, regular rhythm, normal heart sounds and intact distal pulses.   Pulmonary/Chest: Effort normal and breath sounds normal.  Lymphadenopathy:    She has no cervical adenopathy.  Neurological: She is alert and oriented to person, place, and time.  Skin: Skin is warm and dry. She is not diaphoretic. No erythema.  Psychiatric: Judgment normal. Her mood appears anxious. Her speech is rapid and/or pressured. She is agitated. Cognition and memory are normal.      Results for orders placed in visit on 07/15/13  POCT CBC      Result Value Range   WBC 11.3 (*) 4.6 - 10.2 K/uL   Lymph, poc 5.1 (*) 0.6 - 3.4   POC LYMPH PERCENT 45.5  10 - 50 %L   MID (cbc) 0.8  0 - 0.9   POC MID % 7.4  0 - 12 %M   POC Granulocyte 5.3  2 - 6.9   Granulocyte percent 47.1  37 - 80 %G   RBC 4.57  4.04 - 5.48 M/uL   Hemoglobin 13.6  12.2 - 16.2 g/dL   HCT, POC 16.1  09.6 - 47.9 %   MCV 92.7  80 - 97 fL   MCH, POC 29.8  27 - 31.2 pg   MCHC 32.1  31.8 - 35.4 g/dL   RDW, POC 04.5     Platelet Count, POC 334  142 - 424 K/uL   MPV 8.2  0 - 99.8 fL  GLUCOSE, POCT (MANUAL RESULT ENTRY)      Result Value Range   POC Glucose 98  70 - 99 mg/dl  POCT GLYCOSYLATED HEMOGLOBIN (HGB A1C)      Result Value Range   Hemoglobin A1C 6.9     POCT URINE PREGNANCY      Result Value Range   Preg Test, Ur Negative      Assessment & Plan:  HTN (hypertension) - Plan: well controlled, cmp nml in June - cont lisinopril and atenolol/chlorthalidone.  Contraception management - Plan:  POCT urine pregnancy  Dizziness and giddiness - Plan: TSH, POCT CBC, POCT glucose (manual entry), POCT  glycosylated hemoglobin (Hb A1C)  Unspecified vitamin D deficiency - Plan: Vit D  25 hydroxy (rtn osteoporosis monitoring)  Chest pain - Plan: atypical - pt has seen cardiology prev and rec to sched stress test which she never did - will call cardiology to sched.  Serous otitis media, left - likely cause of intermittent dizziness - Plan: start qhs fluticasone and cetirizine, lots of steam treatments/hot showers/humidifier, consider sinus rinse/netti pot.  Type II or unspecified type diabetes mellitus without mention of complication, not stated as uncontrolled - Plan: NEW DIAGNOSIS. Ambulatory referral to diabetic education and nutrition. Gave hard rx for glucometer, strips, lancets - bring to Dm ed appt for education on use. Cons starting metformin at f/u to help with weight loss.  Meds ordered this encounter  Medications  . fluticasone (FLONASE) 50 MCG/ACT nasal spray    Sig: Place 2 sprays into the nose at bedtime.    Dispense:  16 g    Refill:  6  . lisinopril (PRINIVIL,ZESTRIL) 20 MG tablet    Sig: Take 1 tablet (20 mg total) by mouth daily.    Dispense:  90 tablet    Refill:  1  . cetirizine (ZYRTEC) 10 MG tablet    Sig: Take 1 tablet (10 mg total) by mouth at bedtime.    Dispense:  30 tablet    Refill:  11   Recheck in 3 mos.  Over 40 min spent in face-to-face evaluation of pt.  Norberto Sorenson, MD MPH

## 2013-07-16 LAB — TSH: TSH: 1.373 u[IU]/mL (ref 0.350–4.500)

## 2013-07-17 ENCOUNTER — Encounter: Payer: Self-pay | Admitting: Family Medicine

## 2013-07-17 LAB — VITAMIN D 25 HYDROXY (VIT D DEFICIENCY, FRACTURES): Vit D, 25-Hydroxy: 18 ng/mL — ABNORMAL LOW (ref 30–89)

## 2013-07-17 MED ORDER — ERGOCALCIFEROL 1.25 MG (50000 UT) PO CAPS: 50000.0000 [IU] | ORAL_CAPSULE | ORAL | Status: DC

## 2013-07-17 NOTE — Addendum Note (Signed)
Addended by: Norberto Sorenson on: 07/17/2013 02:14 PM   Modules accepted: Orders

## 2013-07-18 ENCOUNTER — Other Ambulatory Visit: Payer: Self-pay | Admitting: Family Medicine

## 2013-07-19 ENCOUNTER — Telehealth: Payer: Self-pay

## 2013-07-19 MED ORDER — METFORMIN HCL 500 MG PO TABS: 500.0000 mg | ORAL_TABLET | Freq: Two times a day (BID) | ORAL | Status: DC

## 2013-07-19 NOTE — Telephone Encounter (Signed)
Pt is needing to talk with dr Clelia Croft about a pill she was wanting her to start for weight loss and her diabetes -at first patient stated she wanted to try on her own and now she has changed her mind  Best number is [phone]

## 2013-07-19 NOTE — Telephone Encounter (Signed)
It was metformin - take with food - with breakfast and dinner. Can have some GI effects initially but should go away within several weeks.  Sent to pharmacy.

## 2013-07-22 NOTE — Telephone Encounter (Signed)
Called pt to advise

## 2013-07-23 ENCOUNTER — Telehealth: Payer: Self-pay | Admitting: Radiology

## 2013-07-23 NOTE — Telephone Encounter (Signed)
Patient calling us back regarding her labs [phone]

## 2013-07-23 NOTE — Telephone Encounter (Signed)
Patient has called [place], with questions about her labs. I called her back and left message for call back

## 2013-07-24 NOTE — Telephone Encounter (Signed)
Called again, left message for her to call me back

## 2013-07-27 NOTE — Telephone Encounter (Signed)
Called pt back and talked with her in length re: vit D and newly Dxd DM. Advised pt that Dr Clelia Croft mailed her a letter also w/f/up instr's to recheck labs and gave info concerning checking BS and diet. Advised pt to CB if her BS increases over the 120 range it was when we checked it in case Dr Clelia Croft would need to put her on medication. Pt agreed.

## 2013-07-28 ENCOUNTER — Telehealth: Payer: Self-pay

## 2013-07-28 NOTE — Telephone Encounter (Signed)
Pt would like for Dr Clelia Croft to give her a call back she has couple of questions for her, the pt would not go in depth with me what they were to help with directing this call Call back number is [phone]

## 2013-07-31 NOTE — Telephone Encounter (Signed)
Please inquire further.  Pt is welcome to leave message w/ the nurse or come into clinic to see me.

## 2013-07-31 NOTE — Telephone Encounter (Signed)
Brooke Turner spoke to her on 07/27/13 I have called her, again. Left message for her to call me back

## 2013-08-01 NOTE — Telephone Encounter (Signed)
I think this message is duplicate from previous. And Britta Mccreedy has already taken care of this, left another message if she has further questions she is to call back.

## 2013-08-05 ENCOUNTER — Telehealth: Payer: Self-pay

## 2013-08-05 NOTE — Telephone Encounter (Signed)
Would need an OV for evaluation of this as that cream can make certain skin conditions in Dm worse.

## 2013-08-05 NOTE — Telephone Encounter (Signed)
Called patient to advise. Left detailed message advised if questions to call me back.

## 2013-08-05 NOTE — Telephone Encounter (Signed)
Saw Dr.Shaw last.  Was diagnosed with diabetes and is having a darkening and roughing of the skin. Would like a cream called in, pt talked with pharmacist and he suggested a hydrochlor-something.  Will use [facility] on [facility]. Call at [phone].

## 2013-08-09 ENCOUNTER — Encounter: Payer: Self-pay | Admitting: *Deleted

## 2013-08-09 ENCOUNTER — Encounter: Payer: 59 | Attending: Family Medicine | Admitting: *Deleted

## 2013-08-09 DIAGNOSIS — E119 Type 2 diabetes mellitus without complications: Secondary | ICD-10-CM

## 2013-08-09 DIAGNOSIS — Z713 Dietary counseling and surveillance: Secondary | ICD-10-CM | POA: Insufficient documentation

## 2013-08-09 NOTE — Progress Notes (Signed)
Medical Nutrition Therapy:  Appt start time: 0930 end time:  1030.  Assessment:  Patient newly diagnosed with type 2 diabetes. She reports a family history on her father's side of the family. She has a prescription for metformin, but is not taking, as she would like to try to control with diet and exercise. She has been running/walking all summer, but reports that sweetened drinks were an issue with her. Since cutting out sweetened drinks, she has lost about 4-5 pounds in the last month. She does not like diet drinks/Crystal Light. She has a blood glucose meter, which I instructed her how to use.    MEDICATIONS: Lisinopril, vitamin D, atenolol-chlorthalidone   DIETARY INTAKE:   Usual eating pattern includes 3 meals and 2 snacks per day.  24-hr recall:  B ( AM): Fruit OR breakfast sandwich, water/apple or orange juice  Snk ( AM): None  L ( PM): Salad with chicken, yogurt, OR soup, fruit, water Snk ( PM): Fruit, yogurt, popcorn, soup D ( PM): Soup OR beans with cheese OR [place] burgers OR chicken Snk ( PM): Same Beverages: Water, juice, occasional soda.   Usual physical activity: 5 days a week walking/running, 1-2 miles, pushups, squats  Estimated energy needs: 1500 calories 188 g carbohydrates 94 g protein 42 g fat  Progress Towards Goal(s):  In progress.   Nutritional Diagnosis:  NB-1.1 Food and nutrition-related knowledge deficit As related to diabetes.  As evidenced by no need for prior education.    Intervention:  Nutrition counseling. We discussed basic carb counting, including foods with carbs, label reading, portion size, and meal planning.   Goals:  1. 2-3 carb choices per meal, 1 choice per snack.  2. 1 pound weight loss per week.  3. Read food label for total carbohydrates and calories.  4. Continue to limit sugar sweetened drinks. Try [place]-type flavorings or different types of diet drinks per preference.  5. Continue to exercise at least 30 minutes 5 days weekly.    Handouts given during visit include:  Living Well with Diabetes booklet  Yellow meal plan card  Monitoring/Evaluation:  Dietary intake, exercise, blood glucose, and body weight prn.

## 2013-08-13 ENCOUNTER — Telehealth: Payer: Self-pay

## 2013-08-13 NOTE — Telephone Encounter (Signed)
Target fasting blood sugar 90-140. Pt seen by dietitian on 08/09/13 and given Diabetes literature to read. She may have a better understanding of blood sugar monitoring and appropriate values after reviewing that material. If she is having trouble with diet or monitoring BS, she may need to follow-up with dietitian.

## 2013-08-13 NOTE — Telephone Encounter (Signed)
What do you want her target glucose to be?

## 2013-08-13 NOTE — Telephone Encounter (Signed)
Pt would like to know what her glucose level should be. Best# [phone]

## 2013-08-14 ENCOUNTER — Telehealth: Payer: Self-pay | Admitting: Radiology

## 2013-08-14 NOTE — Telephone Encounter (Signed)
Patient indicates she has darkened area on her neck and she is advised she will need office visit for this, since steroid creams will raise her blood glucose levels.

## 2013-08-14 NOTE — Telephone Encounter (Signed)
Thank you I have called her to advise. 

## 2013-11-18 ENCOUNTER — Ambulatory Visit (INDEPENDENT_AMBULATORY_CARE_PROVIDER_SITE_OTHER): Payer: 59 | Admitting: Physician Assistant

## 2013-11-18 VITALS — BP 118/78 | HR 63 | Temp 99.5°F | Resp 12 | Ht 63.0 in | Wt 224.0 lb

## 2013-11-18 DIAGNOSIS — J029 Acute pharyngitis, unspecified: Secondary | ICD-10-CM

## 2013-11-18 DIAGNOSIS — J039 Acute tonsillitis, unspecified: Secondary | ICD-10-CM

## 2013-11-18 DIAGNOSIS — R5381 Other malaise: Secondary | ICD-10-CM

## 2013-11-18 DIAGNOSIS — R5383 Other fatigue: Secondary | ICD-10-CM

## 2013-11-18 LAB — POCT CBC
Granulocyte percent: 48.2 %G (ref 37–80)
HCT, POC: 45.6 % (ref 37.7–47.9)
Hemoglobin: 14.3 g/dL (ref 12.2–16.2)
Lymph, poc: 5.4 — AB (ref 0.6–3.4)
MCH, POC: 29.2 pg (ref 27–31.2)
MCHC: 31.4 g/dL — AB (ref 31.8–35.4)
MCV: 93.2 fL (ref 80–97)
MID (cbc): 0.8 (ref 0–0.9)
MPV: 7.9 fL (ref 0–99.8)
POC Granulocyte: 5.8 (ref 2–6.9)
POC LYMPH PERCENT: 45.3 %L (ref 10–50)
POC MID %: 6.5 %M (ref 0–12)
Platelet Count, POC: 3.7 10*3/uL — AB (ref 142–424)
RBC: 4.89 M/uL (ref 4.04–5.48)
RDW, POC: 13.3 %
WBC: 12 10*3/uL — AB (ref 4.6–10.2)

## 2013-11-18 LAB — POCT INFLUENZA A/B
Influenza A, POC: NEGATIVE
Influenza B, POC: NEGATIVE

## 2013-11-18 LAB — POCT RAPID STREP A (OFFICE): Rapid Strep A Screen: NEGATIVE

## 2013-11-18 MED ORDER — AZITHROMYCIN 250 MG PO TABS: ORAL_TABLET | ORAL | Status: DC

## 2013-11-18 MED ORDER — HYDROCODONE-ACETAMINOPHEN 5-325 MG PO TABS: 1.0000 | ORAL_TABLET | Freq: Four times a day (QID) | ORAL | Status: DC | PRN

## 2013-11-18 NOTE — Progress Notes (Signed)
Subjective:    Patient ID: ALYSSUM RESTER, female    DOB: [birth year], 38 y.o.   MRN: [id number]  HPI Patient ID: AVERYL ZERINGUE MRN: [id number], DOB: 1976-05-23, 38 y.o. Date of Encounter: 11/18/2013, 7:24 PM  Primary Physician: Ellsworth Lennox, MD  Chief Complaint:  Chief Complaint  Patient presents with  . Sore Throat  . Nausea    HPI: 38 y.o. female presents with a 3-4 day history of sore throat. Subjective fever and chills. Some congestion, rhinorrhea, post nasal drip, sneezing, and tinnitus at baseline. No cough or otalgia. Normal hearing. Mild nausea. No vomiting or diarrhea. Able to swallow saliva, but hurts to do so, mainly on the left hand side. Decreased appetite secondary to sore throat. She is trying to push fluids, but is hurts her throat to drink. She has bene feeling somewhat run down lately as well.   Patient reported history of mono in the past.   Past Medical History  Diagnosis Date  . Hypertension   . GERD (gastroesophageal reflux disease)      Home Meds: Prior to Admission medications   Medication Sig Start Date End Date Taking? Authorizing Provider  albuterol (PROVENTIL HFA;VENTOLIN HFA) 108 (90 BASE) MCG/ACT inhaler Inhale 2 puffs into the lungs every 6 (six) hours as needed. 09/07/12  Yes Barton Fanny, MD  atenolol-chlorthalidone (TENORETIC) 50-25 MG per tablet Take 1/2 to 1 tablet by mouth daily. 07/19/13  Yes [name], MD  cetirizine (ZYRTEC) 10 MG tablet Take 1 tablet (10 mg total) by mouth at bedtime. 07/15/13  No [name], MD  cyclobenzaprine (FLEXERIL) 10 MG tablet Take 1 tablet (10 mg total) by mouth 3 (three) times daily as needed for muscle spasms. 04/21/13  Yes Ellison Carwin, MD  ergocalciferol (VITAMIN D2) 50000 UNITS capsule Take 1 capsule (50,000 Units total) by mouth once a week. 07/17/13  Yes [name], MD  fluticasone (FLONASE) 50 MCG/ACT nasal spray Place 2 sprays into the nose at bedtime. 07/15/13  Yes [name],  MD  ipratropium (ATROVENT) 0.03 % nasal spray Place 2 sprays into the nose 2 (two) times daily. 08/08/12  Yes Chelle S Jeffery, PA-C  lisinopril (PRINIVIL,ZESTRIL) 20 MG tablet Take 1 tablet (20 mg total) by mouth daily. 07/15/13  Yes [name], MD  LORazepam (ATIVAN) 0.5 MG tablet Take 1 tablet (0.5 mg total) by mouth daily as needed. 06/05/13  Yes Barton Fanny, MD  metFORMIN (GLUCOPHAGE) 500 MG tablet Take 1 tablet (500 mg total) by mouth 2 (two) times daily with a meal. 07/19/13  Yes [name], MD  naproxen sodium (ANAPROX DS) 550 MG tablet Take 1 tablet (550 mg total) by mouth 2 (two) times daily with a meal. 04/21/13 04/21/14 Yes Ellison Carwin, MD  norethindrone (ORTHO MICRONOR) 0.35 MG tablet Take 1 tablet (0.35 mg total) by mouth daily. 03/29/13  Yes Darreld Mclean, MD    Allergies:  Allergies  Allergen Reactions  . Penicillins Hives  . Tamiflu [Oseltamivir Phosphate] Nausea And Vomiting    History   Social History  . Marital Status: Single    Spouse Name: N/A    Number of Children: 0  . Years of Education: 16   Occupational History  . Customer Service [employer]   Social History Main Topics  . Smoking status: Former Smoker    Types: Cigarettes    Quit date: 08/07/2012  . Smokeless tobacco: Never Used     Comment: Maybe six  cigarerres a week  . Alcohol Use: Yes     Comment: occassionally  . Drug Use: No  . Sexual Activity: Not Currently    Partners: Male    Birth Control/ Protection: Other-see comments     Comment: NuvaRing   Other Topics Concern  . Not on file   Social History Narrative   Lives alone.      Review of Systems  Constitutional: Positive for appetite change and fatigue. Negative for fever and chills.  HENT: Positive for congestion, postnasal drip, sinus pressure, sneezing, sore throat and tinnitus. Negative for ear pain, hearing loss, rhinorrhea and voice change.   Respiratory: Negative for cough, shortness of breath and  wheezing.   Gastrointestinal: Positive for nausea. Negative for vomiting.  Neurological: Positive for headaches.       Objective:   Physical Exam  Physical Exam: Blood pressure 118/78, pulse 63, temperature 99.5 F (37.5 C), temperature source Oral, resp. rate 12, height 5\' 3"  (1.6 m), weight 224 lb (101.606 kg), SpO2 100.00%., Body mass index is 39.69 kg/(m^2). General: Well developed, well nourished, in no acute distress. Head: Normocephalic, atraumatic, eyes without discharge, sclera non-icteric, nares are congested. Bilateral auditory canals clear, TM's are without perforation, pearly grey with reflective cone of light bilaterally. No sinus TTP. Oral cavity moist, dentition normal. Posterior pharynx erythematous with pinpoint ulcer along the left posterior tonsillar pillar. No post nasal drip, exudate, or peritonsillar abscess. Uvula midline.  Neck: Supple. No thyromegaly. Full ROM. Lymph nodes: less than 2 cm PC bilaterally. Lungs: Clear bilaterally to auscultation without wheezes, rales, or rhonchi. Breathing is unlabored. Heart: RRR with S1 S2. No murmurs, rubs, or gallops appreciated. Msk:  Strength and tone normal for age. Extremities: No clubbing or cyanosis. No edema. Neuro: Alert and oriented X 3. Moves all extremities spontaneously. CNII-XII grossly in tact. Psych:  Responds to questions appropriately with a normal affect.    Labs: Results for orders placed in visit on 11/18/13  EPSTEIN-BARR VIRUS VCA ANTIBODY PANEL      Result Value Range   EBV VCA IgG    <18.0 U/mL   EBV VCA IgM    <36.0 U/mL   EBV EA IgG    <9.0 U/mL   EBV NA IgG    <18.0 U/mL  CMV IGM      Result Value Range   CMV IgM    <30.00 AU/mL  CYTOMEGALOVIRUS ANTIBODY, IGG      Result Value Range   Cytomegalovirus Ab-IgG    <0.60 U/mL  COMPREHENSIVE METABOLIC PANEL      Result Value Range   Sodium 136  135 - 145 mEq/L   Potassium 4.0  3.5 - 5.3 mEq/L   Chloride 100  96 - 112 mEq/L   CO2 26  19 - 32  mEq/L   Glucose, Bld 88  70 - 99 mg/dL   BUN 13  6 - 23 mg/dL   Creat 0.89  0.50 - 1.10 mg/dL   Total Bilirubin 0.4  0.3 - 1.2 mg/dL   Alkaline Phosphatase 73  39 - 117 U/L   AST 17  0 - 37 U/L   ALT 12  0 - 35 U/L   Total Protein 7.0  6.0 - 8.3 g/dL   Albumin 4.0  3.5 - 5.2 g/dL   Calcium 9.4  8.4 - 10.5 mg/dL  POCT CBC      Result Value Range   WBC 12.0 (*) 4.6 - 10.2 K/uL   Lymph, poc  5.4 (*) 0.6 - 3.4   POC LYMPH PERCENT 45.3  10 - 50 %L   MID (cbc) 0.8  0 - 0.9   POC MID % 6.5  0 - 12 %M   POC Granulocyte 5.8  2 - 6.9   Granulocyte percent 48.2  37 - 80 %G   RBC 4.89  4.04 - 5.48 M/uL   Hemoglobin 14.3  12.2 - 16.2 g/dL   HCT, POC 45.6  37.7 - 47.9 %   MCV 93.2  80 - 97 fL   MCH, POC 29.2  27 - 31.2 pg   MCHC 31.4 (*) 31.8 - 35.4 g/dL   RDW, POC 13.3     Platelet Count, POC 3.7 (*) 142 - 424 K/uL   MPV 7.9  0 - 99.8 fL  POCT INFLUENZA A/B      Result Value Range   Influenza A, POC Negative     Influenza B, POC Negative    POCT RAPID STREP A (OFFICE)      Result Value Range   Rapid Strep A Screen Negative  Negative   Throat culture, EBV titers, and CMV titers all pending      Assessment & Plan:  38 year old female with pharyngitis, headaches, and fatigue -Azithromycin 250 MG #6 2 po first day then 1 po next 4 days no RF -Norco 5/325 mg 1 po q 4-6 hours prn pain #30 no RF, SED -Rest/fluids -Await labs -RTC/precautions   Christell Faith, PA-C [facility] [place], [place] [address] [phone] 11/18/2013 7:24 PM

## 2013-11-19 LAB — COMPREHENSIVE METABOLIC PANEL
ALT: 12 U/L (ref 0–35)
AST: 17 U/L (ref 0–37)
Albumin: 4 g/dL (ref 3.5–5.2)
Alkaline Phosphatase: 73 U/L (ref 39–117)
BUN: 13 mg/dL (ref 6–23)
CO2: 26 mEq/L (ref 19–32)
Calcium: 9.4 mg/dL (ref 8.4–10.5)
Chloride: 100 mEq/L (ref 96–112)
Creat: 0.89 mg/dL (ref 0.50–1.10)
Glucose, Bld: 88 mg/dL (ref 70–99)
Potassium: 4 mEq/L (ref 3.5–5.3)
Sodium: 136 mEq/L (ref 135–145)
Total Bilirubin: 0.4 mg/dL (ref 0.3–1.2)
Total Protein: 7 g/dL (ref 6.0–8.3)

## 2013-11-20 LAB — EPSTEIN-BARR VIRUS VCA ANTIBODY PANEL
EBV EA IgG: 65.1 U/mL — ABNORMAL HIGH (ref <9.0)
EBV NA IgG: >600 U/mL — ABNORMAL HIGH (ref <18.0)
EBV VCA IgG: >750 U/mL — ABNORMAL HIGH (ref <18.0)
EBV VCA IgM: 36.4 U/mL — ABNORMAL HIGH (ref <36.0)

## 2013-11-21 ENCOUNTER — Other Ambulatory Visit: Payer: Self-pay | Admitting: Physician Assistant

## 2013-11-21 DIAGNOSIS — B2790 Infectious mononucleosis, unspecified without complication: Secondary | ICD-10-CM

## 2013-11-21 LAB — CULTURE, GROUP A STREP: Organism ID, Bacteria: NORMAL

## 2013-11-21 LAB — CYTOMEGALOVIRUS ANTIBODY, IGG: Cytomegalovirus Ab-IgG: <0.2 U/mL (ref <0.60)

## 2013-11-21 LAB — CMV IGM: CMV IgM: <8 AU/mL (ref <30.00)

## 2013-11-21 MED ORDER — FIRST-DUKES MOUTHWASH MT SUSP: 5.0000 mL | Freq: Four times a day (QID) | OROMUCOSAL | Status: DC | PRN

## 2013-12-03 ENCOUNTER — Ambulatory Visit: Payer: 59

## 2013-12-03 ENCOUNTER — Ambulatory Visit (INDEPENDENT_AMBULATORY_CARE_PROVIDER_SITE_OTHER): Payer: 59 | Admitting: Emergency Medicine

## 2013-12-03 VITALS — BP 128/80 | HR 74 | Temp 97.9°F | Resp 16

## 2013-12-03 DIAGNOSIS — R0989 Other specified symptoms and signs involving the circulatory and respiratory systems: Secondary | ICD-10-CM

## 2013-12-03 DIAGNOSIS — R6889 Other general symptoms and signs: Secondary | ICD-10-CM

## 2013-12-03 MED ORDER — GI COCKTAIL ~~LOC~~
30.0000 mL | Freq: Once | ORAL | Status: AC
  Administered 2013-12-03: 30 mL via ORAL

## 2013-12-03 NOTE — Progress Notes (Signed)
   Subjective:    Patient ID: Brooke Turner, female    DOB: 1976/07/20, 38 y.o.   MRN: [id number]  HPI Foreign body sensation in throat after eating buffalo wings.  No feeling of closing throat.  She notes that she feels something in her throat at base of neck.  Able to drink water afterwards, but states it slows down around area.  No prior difficulty with swallowing.  Positive recent diagnosis of mono and continues to have mild sore throat.  PPMH:  Hypertension, obesity.  SH:  Nonsmoker, occasional alcohol   Review of Systems  Constitutional: Negative for fever and chills.  HENT: Positive for trouble swallowing. Negative for facial swelling, sore throat and voice change.   Respiratory: Negative for cough, choking, shortness of breath, wheezing and stridor.   Gastrointestinal: Negative for nausea and vomiting.       Objective:   Physical Exam Blood pressure 128/80, pulse 74, temperature 97.9 F (36.6 C), temperature source Oral, resp. rate 16, last menstrual period 12/02/2013, SpO2 99.00%. There is no weight on file to calculate BMI. Well-developed, well nourished female who is awake, alert and oriented, in NAD. HEENT: [place]/AT, PERRL, EOMI.  Sclera and conjunctiva are clear.  EAC are patent, TMs are normal in appearance. Nasal mucosa is pink and moist. OP with mild erythema. Neck: supple, non-tender, no lymphadenopathy, thyromegaly, no stridor. Heart: RRR, no murmur Lungs: normal effort, CTA Abdomen: normo-active bowel sounds, supple, non-tender, no mass or organomegaly. Extremities: no cyanosis, clubbing or edema. Skin: warm and dry without rash. Psychologic: good mood and appropriate affect, normal speech and behavior.   Dental mirror used to evaluate posterior pharynx and laryngeal area.  Tip of epiglottis visualized and no evidence of FB seen, although unable to visualize to level of valecula.    Assessment & Plan:  Xray:  No visible FB, however there is evidence of  calcification of thyroid cartilage.  Radiology interpretation shows no evidence of FB in epiglotic region.    Laryngeal foreign body sensation.    Will arrange for ENT evaluation in am.  [facility] will call in am with appointment time and place.

## 2013-12-03 NOTE — Patient Instructions (Signed)
Dysphagia Swallowing problems (dysphagia) occur when solids and liquids seem to stick in your throat on the way down to your stomach, or the food takes longer to get to the stomach. Other symptoms include regurgitating food, noises coming from the throat, chest discomfort with swallowing, and a feeling of fullness or the feeling of something being stuck in your throat when swallowing. When blockage in your throat is complete it may be associated with drooling. CAUSES  Problems with swallowing may occur because of problems with the muscles. The food cannot be propelled in the usual manner into your stomach. You may have ulcers, scar tissue, or inflammation in the tube down which food travels from your mouth to your stomach (esophagus), which blocks food from passing normally into the stomach. Causes of inflammation include:  Acid reflux from your stomach into your esophagus.  Infection.  Radiation treatment for cancer.  Medicines taken without enough fluids to wash them down into your stomach. You may have nerve problems that prevent signals from being sent to the muscles of your esophagus to contract and move your food down to your stomach. Globus pharyngeus is a relatively common problem in which there is a sense of an obstruction or difficulty in swallowing, without any physical abnormalities of the swallowing passages being found. This problem usually improves over time with reassurance and testing to rule out other causes. DIAGNOSIS Dysphagia can be diagnosed and its cause can be determined by tests in which you swallow a white substance that helps illuminate the inside of your throat (contrast medium) while X-rays are taken. Sometimes a flexible telescope that is inserted down your throat (endoscopy) to look at your esophagus and stomach is used. TREATMENT   If the dysphagia is caused by acid reflux or infection, medicines may be used.  If the dysphagia is caused by problems with your  swallowing muscles, swallowing therapy may be used to help you strengthen your swallowing muscles.  If the dysphagia is caused by a blockage or mass, procedures to remove the blockage may be done. HOME CARE INSTRUCTIONS  Try to eat soft food that is easier to swallow and check your weight on a daily basis to be sure that it is not decreasing.  Be sure to drink liquids when sitting upright (not lying down). SEEK MEDICAL CARE IF:  You are losing weight because you are unable to swallow.  You are coughing when you drink liquids (aspiration).  You are coughing up partially digested food. SEEK IMMEDIATE MEDICAL CARE IF:  You are unable to swallow your own saliva .  You are having shortness of breath or a fever, or both.  You have a hoarse voice along with difficulty swallowing. MAKE SURE YOU:  Understand these instructions.  Will watch your condition.  Will get help right away if you are not doing well or get worse. Document Released: 10/21/2000 Document Revised: 06/26/2013 Document Reviewed: 04/12/2013 [facility] Patient Information 2014 [place].  You have a sensation of a foreign body in your throat.  We do not see anything tonight, however this cannot rule out a small foreign body.  We are making arrangements for you to see an ENT specialist in the morning.  We will call you in the morning with where your appointment is.

## 2014-01-22 ENCOUNTER — Other Ambulatory Visit: Payer: Self-pay | Admitting: Family Medicine

## 2014-02-28 ENCOUNTER — Other Ambulatory Visit: Payer: Self-pay | Admitting: Physician Assistant

## 2014-03-17 ENCOUNTER — Ambulatory Visit (INDEPENDENT_AMBULATORY_CARE_PROVIDER_SITE_OTHER): Payer: 59 | Admitting: Family Medicine

## 2014-03-17 VITALS — BP 100/70 | HR 78 | Temp 98.1°F | Resp 16 | Ht 64.0 in | Wt 225.0 lb

## 2014-03-17 DIAGNOSIS — R5383 Other fatigue: Secondary | ICD-10-CM

## 2014-03-17 DIAGNOSIS — L83 Acanthosis nigricans: Secondary | ICD-10-CM

## 2014-03-17 DIAGNOSIS — E559 Vitamin D deficiency, unspecified: Secondary | ICD-10-CM

## 2014-03-17 DIAGNOSIS — F419 Anxiety disorder, unspecified: Secondary | ICD-10-CM

## 2014-03-17 DIAGNOSIS — Z79899 Other long term (current) drug therapy: Secondary | ICD-10-CM

## 2014-03-17 DIAGNOSIS — I10 Essential (primary) hypertension: Secondary | ICD-10-CM

## 2014-03-17 DIAGNOSIS — E119 Type 2 diabetes mellitus without complications: Secondary | ICD-10-CM

## 2014-03-17 DIAGNOSIS — IMO0001 Reserved for inherently not codable concepts without codable children: Secondary | ICD-10-CM

## 2014-03-17 DIAGNOSIS — R5381 Other malaise: Secondary | ICD-10-CM

## 2014-03-17 LAB — POCT URINALYSIS DIPSTICK
Bilirubin, UA: NEGATIVE
Glucose, UA: NEGATIVE
Ketones, UA: NEGATIVE
Leukocytes, UA: NEGATIVE
Nitrite, UA: NEGATIVE
Protein, UA: NEGATIVE
Spec Grav, UA: 1.02
Urobilinogen, UA: 0.2
pH, UA: 5.5

## 2014-03-17 LAB — POCT UA - MICROSCOPIC ONLY
Casts, Ur, LPF, POC: NEGATIVE
Crystals, Ur, HPF, POC: NEGATIVE
Mucus, UA: NEGATIVE
Yeast, UA: NEGATIVE

## 2014-03-17 LAB — POCT CBC
Granulocyte percent: 50.7 %G (ref 37–80)
HCT, POC: 41.9 % (ref 37.7–47.9)
Hemoglobin: 13.9 g/dL (ref 12.2–16.2)
Lymph, poc: 4.9 — AB (ref 0.6–3.4)
MCH, POC: 30.2 pg (ref 27–31.2)
MCHC: 33.2 g/dL (ref 31.8–35.4)
MCV: 91 fL (ref 80–97)
MID (cbc): 0.8 (ref 0–0.9)
MPV: 8 fL (ref 0–99.8)
POC Granulocyte: 5.9 (ref 2–6.9)
POC LYMPH PERCENT: 42.2 %L (ref 10–50)
POC MID %: 7.1 %M (ref 0–12)
Platelet Count, POC: 344 10*3/uL (ref 142–424)
RBC: 4.6 M/uL (ref 4.04–5.48)
RDW, POC: 13.7 %
WBC: 11.7 10*3/uL — AB (ref 4.6–10.2)

## 2014-03-17 LAB — POCT GLYCOSYLATED HEMOGLOBIN (HGB A1C): Hemoglobin A1C: 6.4

## 2014-03-17 MED ORDER — NORETHINDRONE 0.35 MG PO TABS: 1.0000 | ORAL_TABLET | Freq: Every day | ORAL | Status: DC

## 2014-03-17 MED ORDER — LISINOPRIL 10 MG PO TABS: 10.0000 mg | ORAL_TABLET | Freq: Every day | ORAL | Status: DC

## 2014-03-17 MED ORDER — LORAZEPAM 0.5 MG PO TABS: 0.5000 mg | ORAL_TABLET | Freq: Every day | ORAL | Status: DC | PRN

## 2014-03-17 MED ORDER — CYCLOBENZAPRINE HCL 10 MG PO TABS: 10.0000 mg | ORAL_TABLET | Freq: Three times a day (TID) | ORAL | Status: DC | PRN

## 2014-03-17 MED ORDER — ATENOLOL-CHLORTHALIDONE 50-25 MG PO TABS: ORAL_TABLET | ORAL | Status: DC

## 2014-03-17 NOTE — Progress Notes (Addendum)
Subjective:    Patient ID: Brooke Turner, female    DOB: December 05, 1975, 38 y.o.   MRN: [id number] This chart was scribed for [name], MD by Randa Evens, ED Scribe. This Patient was seen in room 11 and the patients care was started at 8:03 PM Chief Complaint  Patient presents with  . Medication Refill  . Diabetes    recheck sugar, fmla paperwork  . body secretion    from belly button, bad odor     HPI  HPI Comments: Brooke Turner is a 38 y.o. female who presents to the [facility] for f/u on new diagnosis of DM about 8 mos prior. She never started the metformin because she really wanted to give diet and exercise a chance to work. She states that's she has been more active as far as walking and eating healthier. She states that she needs more test strips for her glucometer. Though she is not on medicine she wants to be able to check her glucose in case she is feeling poorly and so she can give herself feedback on her food choices.  States she recently visited a nutritionist. States her problem was drinking a lot of liquids with sugar. States that she has cut back on the sugary drinks. She states that she sometimes drinks a large quantity of coke zeros. She states that she has recently changed her diet. She states that she eats breakfast now.   She states that some nights she wakes up with heart palpitations and shortness of breath. She states that when she wakes up she doesn't feel well rested. Has never had a sleep study.  She states that she sometimes when she urinates she gets a pain in her left side. She states that doesn't happen every time she urinates   She states that she noticed last year around August her belly button was giving off a bad smell. She states that she thought it use to be associated with exercising during the summer time. She states its a clear fluid, with some redness. She states that when she woke up she noticed some residue  left over. She also states that's she sometimes get nauseated.   Past Medical History  Diagnosis Date  . Hypertension   . GERD (gastroesophageal reflux disease)    No current outpatient prescriptions on file prior to visit.   No current facility-administered medications on file prior to visit.   Allergies  Allergen Reactions  . Penicillins Hives  . Tamiflu [Oseltamivir Phosphate] Nausea And Vomiting    Review of Systems  Constitutional: Negative for fever, chills, activity change and appetite change.  Cardiovascular: Positive for palpitations.  Skin: Positive for wound. Negative for rash.  Neurological: Negative for dizziness and light-headedness.  Psychiatric/Behavioral: Positive for sleep disturbance.    Objective:  BP 100/70  Pulse 78  Temp(Src) 98.1 F (36.7 C) (Oral)  Resp 16  Ht 5\' 4"  (1.626 m)  Wt 225 lb (102.059 kg)  BMI 38.60 kg/m2  LMP 02/24/2014  Physical Exam  Nursing note and vitals reviewed. Constitutional: She is oriented to person, place, and time. She appears well-developed and well-nourished. No distress.  HENT:  Head: Normocephalic and atraumatic.  Eyes: EOM are normal.  Neck: Neck supple.  Cardiovascular: Normal rate, regular rhythm and normal heart sounds.   Pulmonary/Chest: Effort normal and breath sounds normal. No respiratory distress.  Abdominal: Soft. Normal appearance and bowel sounds are normal. There is no hepatosplenomegaly. There is generalized  tenderness. There is no rebound and no guarding. No hernia.  umbilicus explored with wound clx swab - small healing fissure seen at 7 o' clock; no drainage, erythema, tenderness, or odor noted.  Musculoskeletal: Normal range of motion.  Neurological: She is alert and oriented to person, place, and time.  Skin: Skin is warm and dry. Rash noted. Rash is macular.  Mild darkening of skin on posterior neck  Psychiatric: She has a normal mood and affect. Her behavior is normal.   Results for orders  placed in visit on 03/17/14  WOUND CULTURE      Result Value Ref Range   Gram Stain No WBC Seen     Gram Stain No Squamous Epithelial Cells Seen     Gram Stain Few Gram Positive Cocci In Pairs In Chains     Preliminary Report Abundant GROUP B STREP (S.AGALACTIAE) ISOLATED    COMPREHENSIVE METABOLIC PANEL      Result Value Ref Range   Sodium 138  135 - 145 mEq/L   Potassium 3.6  3.5 - 5.3 mEq/L   Chloride 102  96 - 112 mEq/L   CO2 24  19 - 32 mEq/L   Glucose, Bld 152 (*) 70 - 99 mg/dL   BUN 12  6 - 23 mg/dL   Creat 1.03  0.50 - 1.10 mg/dL   Total Bilirubin 0.4  0.2 - 1.2 mg/dL   Alkaline Phosphatase 87  39 - 117 U/L   AST 17  0 - 37 U/L   ALT 14  0 - 35 U/L   Total Protein 7.1  6.0 - 8.3 g/dL   Albumin 4.2  3.5 - 5.2 g/dL   Calcium 8.9  8.4 - 10.5 mg/dL  TSH      Result Value Ref Range   TSH 0.894  0.350 - 4.500 uIU/mL  VITAMIN D 25 HYDROXY      Result Value Ref Range   Vit D, 25-Hydroxy 23 (*) 30 - 89 ng/mL  POCT CBC      Result Value Ref Range   WBC 11.7 (*) 4.6 - 10.2 K/uL   Lymph, poc 4.9 (*) 0.6 - 3.4   POC LYMPH PERCENT 42.2  10 - 50 %L   MID (cbc) 0.8  0 - 0.9   POC MID % 7.1  0 - 12 %M   POC Granulocyte 5.9  2 - 6.9   Granulocyte percent 50.7  37 - 80 %G   RBC 4.60  4.04 - 5.48 M/uL   Hemoglobin 13.9  12.2 - 16.2 g/dL   HCT, POC 41.9  37.7 - 47.9 %   MCV 91.0  80 - 97 fL   MCH, POC 30.2  27 - 31.2 pg   MCHC 33.2  31.8 - 35.4 g/dL   RDW, POC 13.7     Platelet Count, POC 344  142 - 424 K/uL   MPV 8.0  0 - 99.8 fL  POCT UA - MICROSCOPIC ONLY      Result Value Ref Range   WBC, Ur, HPF, POC 0-2     RBC, urine, microscopic 2-4     Bacteria, U Microscopic trace     Mucus, UA neg     Epithelial cells, urine per micros 1-3     Crystals, Ur, HPF, POC neg     Casts, Ur, LPF, POC neg     Yeast, UA neg    POCT URINALYSIS DIPSTICK      Result Value Ref Range  Color, UA yellow     Clarity, UA clear     Glucose, UA neg     Bilirubin, UA neg     Ketones, UA neg      Spec Grav, UA 1.020     Blood, UA small     pH, UA 5.5     Protein, UA neg     Urobilinogen, UA 0.2     Nitrite, UA neg     Leukocytes, UA Negative    POCT GLYCOSYLATED HEMOGLOBIN (HGB A1C)      Result Value Ref Range   Hemoglobin A1C 6.4      Assessment & Plan:   Unspecified vitamin D deficiency - Plan: POCT CBC, POCT UA - Microscopic Only, POCT urinalysis dipstick, POCT glycosylated hemoglobin (Hb A1C), Wound culture, Comprehensive metabolic panel, TSH, Vitamin D, 25-hydroxy - still to low. Cont rx - refilled x 6 mos.  Type II or unspecified type diabetes mellitus without mention of complication, not stated as uncontrolled - Plan: POCT CBC, POCT UA - Microscopic Only, POCT urinalysis dipstick, POCT glycosylated hemoglobin (Hb A1C), Wound culture, Comprehensive metabolic panel, TSH, Vitamin D, 25-hydroxy - REsolved though diet and exercise!!!! Pt now w/ pre-DM/insulin resistence. Cont w/ diet and exercise. Test strips refilled. Increase exercise  Obesity, Class III, BMI 40-49.9 (morbid obesity) - Plan: POCT CBC, POCT UA - Microscopic Only, POCT urinalysis dipstick, POCT glycosylated hemoglobin (Hb A1C), Wound culture, Comprehensive metabolic panel, TSH, Vitamin D, 25-hydroxy - cut out diet drinks, would be wiling to cons short term trial of weight loss med in the pt as she has been doing so well - reconsider if hets a plateu or maybe over winter. Suspect sleep apnea due to fatigue - refer to [facility] neurology  HTN (hypertension) - Plan: POCT CBC, POCT UA - Microscopic Only, POCT urinalysis dipstick, POCT glycosylated hemoglobin (Hb A1C), Wound culture, Comprehensive metabolic panel, TSH, Vitamin D, 25-hydroxy  Contraception - Plan: norethindrone (ORTHO MICRONOR) 0.35 MG tablet  Anxiety - Plan: LORazepam (ATIVAN) 0.5 MG tablet, DISCONTINUED: LORazepam (ATIVAN) 0.5 MG tablet  Acanthosis nigricans - should improve w/ diet/exercise/weightloss but pt really wants tyo try more  immed otp trx - warned of pposs skow result  Meds ordered this encounter  Medications  . atenolol-chlorthalidone (TENORETIC) 50-25 MG per tablet    Sig: Take 1/2 to 1 tablet by mouth daily.    Dispense:  30 tablet    Refill:  5  . norethindrone (ORTHO MICRONOR) 0.35 MG tablet    Sig: Take 1 tablet (0.35 mg total) by mouth daily.    Dispense:  3 Package    Refill:  3    Order Specific Question:  Supervising Provider    Answer:  Wardell Honour [[id number]]  . DISCONTD: LORazepam (ATIVAN) 0.5 MG tablet    Sig: Take 1 tablet (0.5 mg total) by mouth daily as needed.    Dispense:  30 tablet    Refill:  0  . cyclobenzaprine (FLEXERIL) 10 MG tablet    Sig: Take 1 tablet (10 mg total) by mouth 3 (three) times daily as needed for muscle spasms.    Dispense:  30 tablet    Refill:  0  . lisinopril (PRINIVIL,ZESTRIL) 10 MG tablet    Sig: Take 1 tablet (10 mg total) by mouth daily. PATIENT NEEDS OFFICE VISIT FOR ADDITIONAL REFILLS - 2nd NOTICE    Dispense:  90 tablet    Refill:  1  . LORazepam (ATIVAN) 0.5 MG tablet  Sig: Take 1 tablet (0.5 mg total) by mouth daily as needed.    Dispense:  30 tablet    Refill:  0    I personally performed the services described in this documentation, which was scribed in my presence. The recorded information has been reviewed and considered, and addended by me as needed.  [name], MD MPH

## 2014-03-17 NOTE — Patient Instructions (Signed)
Exercise to Lose Weight Exercise and a healthy diet may help you lose weight. Your doctor may suggest specific exercises. EXERCISE IDEAS AND TIPS  Choose low-cost things you enjoy doing, such as walking, bicycling, or exercising to workout videos.  Take stairs instead of the elevator.  Walk during your lunch break.  Park your car further away from work or school.  Go to a gym or an exercise class.  Start with 5 to 10 minutes of exercise each day. Build up to 30 minutes of exercise 4 to 6 days a week.  Wear shoes with good support and comfortable clothes.  Stretch before and after working out.  Work out until you breathe harder and your heart beats faster.  Drink extra water when you exercise.  Do not do so much that you hurt yourself, feel dizzy, or get very short of breath. Exercises that burn about 150 calories:  Running 1  miles in 15 minutes.  Playing volleyball for 45 to 60 minutes.  Washing and waxing a car for 45 to 60 minutes.  Playing touch football for 45 minutes.  Walking 1  miles in 35 minutes.  Pushing a stroller 1  miles in 30 minutes.  Playing basketball for 30 minutes.  Raking leaves for 30 minutes.  Bicycling 5 miles in 30 minutes.  Walking 2 miles in 30 minutes.  Dancing for 30 minutes.  Shoveling snow for 15 minutes.  Swimming laps for 20 minutes.  Walking up stairs for 15 minutes.  Bicycling 4 miles in 15 minutes.  Gardening for 30 to 45 minutes.  Jumping rope for 15 minutes.  Washing windows or floors for 45 to 60 minutes. Document Released: 11/26/2010 Document Revised: 01/16/2012 Document Reviewed: 11/26/2010 [facility] Patient Information 2014 [place], [place]. Managing Your High Blood Pressure Blood pressure is a measurement of how forceful your blood is pressing against the walls of the arteries. Arteries are muscular tubes within the circulatory system. Blood pressure does not stay the same. Blood pressure rises when you are  active, excited, or nervous; and it lowers during sleep and relaxation. If the numbers measuring your blood pressure stay above normal most of the time, you are at risk for health problems. High blood pressure (hypertension) is a long-term (chronic) condition in which blood pressure is elevated. A blood pressure reading is recorded as two numbers, such as 120 over 80 (or 120/80). The first, higher number is called the systolic pressure. It is a measure of the pressure in your arteries as the heart beats. The second, lower number is called the diastolic pressure. It is a measure of the pressure in your arteries as the heart relaxes between beats.  Keeping your blood pressure in a normal range is important to your overall health and prevention of health problems, such as heart disease and stroke. When your blood pressure is uncontrolled, your heart has to work harder than normal. High blood pressure is a very common condition in adults because blood pressure tends to rise with age. Men and women are equally likely to have hypertension but at different times in life. Before age 62, men are more likely to have hypertension. After 39 years of age, women are more likely to have it. Hypertension is especially common in African Americans. This condition often has no signs or symptoms. The cause of the condition is usually not known. Your caregiver can help you come up with a plan to keep your blood pressure in a normal, healthy range. BLOOD PRESSURE STAGES  Blood pressure is classified into four stages: normal, prehypertension, stage 1, and stage 2. Your blood pressure reading will be used to determine what type of treatment, if any, is necessary. Appropriate treatment options are tied to these four stages:  Normal  Systolic pressure (mm Hg): below 120.  Diastolic pressure (mm Hg): below 80. Prehypertension  Systolic pressure (mm Hg): 120 to 139.  Diastolic pressure (mm Hg): 80 to 89. Stage1  Systolic  pressure (mm Hg): 140 to 159.  Diastolic pressure (mm Hg): 90 to 99. Stage2  Systolic pressure (mm Hg): 160 or above.  Diastolic pressure (mm Hg): 100 or above. RISKS RELATED TO HIGH BLOOD PRESSURE Managing your blood pressure is an important responsibility. Uncontrolled high blood pressure can lead to:  A heart attack.  A stroke.  A weakened blood vessel (aneurysm).  Heart failure.  Kidney damage.  Eye damage.  Metabolic syndrome.  Memory and concentration problems. HOW TO MANAGE YOUR BLOOD PRESSURE Blood pressure can be managed effectively with lifestyle changes and medicines (if needed). Your caregiver will help you come up with a plan to bring your blood pressure within a normal range. Your plan should include the following: Education  Read all information provided by your caregivers about how to control blood pressure.  Educate yourself on the latest guidelines and treatment recommendations. New research is always being done to further define the risks and treatments for high blood pressure. Lifestylechanges  Control your weight.  Avoid smoking.  Stay physically active.  Reduce the amount of salt in your diet.  Reduce stress.  Control any chronic conditions, such as high cholesterol or diabetes.  Reduce your alcohol intake. Medicines  Several medicines (antihypertensive medicines) are available, if needed, to bring blood pressure within a normal range. Communication  Review all the medicines you take with your caregiver because there may be side effects or interactions.  Talk with your caregiver about your diet, exercise habits, and other lifestyle factors that may be contributing to high blood pressure.  See your caregiver regularly. Your caregiver can help you create and adjust your plan for managing high blood pressure. RECOMMENDATIONS FOR TREATMENT AND FOLLOW-UP  The following recommendations are based on current guidelines for managing high blood  pressure in nonpregnant adults. Use these recommendations to identify the proper follow-up period or treatment option based on your blood pressure reading. You can discuss these options with your caregiver.  Systolic pressure of 329 to 518 or diastolic pressure of 80 to 89: Follow up with your caregiver as directed.  Systolic pressure of 841 to 660 or diastolic pressure of 90 to 100: Follow up with your caregiver within 2 months.  Systolic pressure above 630 or diastolic pressure above 160: Follow up with your caregiver within 1 month.  Systolic pressure above 109 or diastolic pressure above 323: Consider antihypertensive therapy; follow up with your caregiver within 1 week.  Systolic pressure above 557 or diastolic pressure above 322: Begin antihypertensive therapy; follow up with your caregiver within 1 week. Document Released: 07/18/2012 Document Reviewed: 07/18/2012 [facility] Patient Information 2014 [place], [place].

## 2014-03-18 LAB — COMPREHENSIVE METABOLIC PANEL
ALT: 14 U/L (ref 0–35)
AST: 17 U/L (ref 0–37)
Albumin: 4.2 g/dL (ref 3.5–5.2)
Alkaline Phosphatase: 87 U/L (ref 39–117)
BUN: 12 mg/dL (ref 6–23)
CO2: 24 mEq/L (ref 19–32)
Calcium: 8.9 mg/dL (ref 8.4–10.5)
Chloride: 102 mEq/L (ref 96–112)
Creat: 1.03 mg/dL (ref 0.50–1.10)
Glucose, Bld: 152 mg/dL — ABNORMAL HIGH (ref 70–99)
Potassium: 3.6 mEq/L (ref 3.5–5.3)
Sodium: 138 mEq/L (ref 135–145)
Total Bilirubin: 0.4 mg/dL (ref 0.2–1.2)
Total Protein: 7.1 g/dL (ref 6.0–8.3)

## 2014-03-18 LAB — TSH: TSH: 0.894 u[IU]/mL (ref 0.350–4.500)

## 2014-03-19 ENCOUNTER — Telehealth: Payer: Self-pay

## 2014-03-19 ENCOUNTER — Encounter: Payer: Self-pay | Admitting: Family Medicine

## 2014-03-19 DIAGNOSIS — L83 Acanthosis nigricans: Secondary | ICD-10-CM | POA: Insufficient documentation

## 2014-03-19 LAB — VITAMIN D 25 HYDROXY (VIT D DEFICIENCY, FRACTURES): Vit D, 25-Hydroxy: 23 ng/mL — ABNORMAL LOW (ref 30–89)

## 2014-03-19 MED ORDER — GLUCOSE BLOOD VI STRP: ORAL_STRIP | Status: DC

## 2014-03-19 MED ORDER — TRETINOIN 0.1 % EX CREA: TOPICAL_CREAM | Freq: Every day | CUTANEOUS | Status: DC

## 2014-03-19 MED ORDER — ERGOCALCIFEROL 1.25 MG (50000 UT) PO CAPS: 50000.0000 [IU] | ORAL_CAPSULE | ORAL | Status: DC

## 2014-03-19 NOTE — Telephone Encounter (Signed)
LMOM w/DR Shaw's instr's. Rx was already sent by Dr Brigitte Pulse.

## 2014-03-19 NOTE — Telephone Encounter (Signed)
Patient is looking for a prescription from Dr. Brigitte Pulse that is a lightening of the skin steroid creame.  [phone]

## 2014-03-19 NOTE — Telephone Encounter (Signed)
LMVM to CB. 

## 2014-03-19 NOTE — Telephone Encounter (Signed)
Oops - sorry I forgot to send this off at her visit.  Lets try top retin-A qhs- data seems to support that it might lighten the skin in about 2 wks - use lots of sunscreen also as this does cause a lot of sun sensitivity.  If not starting to see improvement in 2 wks, pt should call and we could try adding in 12% ammonium lactate cream bid.

## 2014-03-20 LAB — WOUND CULTURE
Gram Stain: NONE SEEN
Gram Stain: NONE SEEN

## 2014-03-21 ENCOUNTER — Telehealth: Payer: Self-pay

## 2014-03-21 NOTE — Telephone Encounter (Signed)
Pt was in on Monday and prescribed a cream for diabetics to put on the back of neck and lighten skin, when she went to the pharmacy the told her for her insurance  To cover she must be under 29; they suggested atralin or renova. Please call pt.

## 2014-03-21 NOTE — Telephone Encounter (Signed)
Do we have another medication pt can try since insurance will not cover this medication?

## 2014-03-24 MED ORDER — TRETINOIN 0.05 % EX GEL: CUTANEOUS | Status: DC

## 2014-03-24 NOTE — Telephone Encounter (Signed)
Left detailed message on machine for patients medication.

## 2014-03-24 NOTE — Telephone Encounter (Signed)
Changed med - still same generic med - just different brand/formulation. Sent in.

## 2014-04-21 ENCOUNTER — Telehealth: Payer: Self-pay

## 2014-04-21 NOTE — Telephone Encounter (Signed)
Pt requesting refill for her inhaler  Best phone for pt is [phone]   Pharmacy [facility] [address]

## 2014-04-22 NOTE — Telephone Encounter (Signed)
Pt called in and needs are new RX for her Ventolin inhaler hers has expired and she is struggling with the heat. She can be reached at [phone]. She uses the [facility] on [address]. Thank you

## 2014-04-23 MED ORDER — ALBUTEROL SULFATE HFA 108 (90 BASE) MCG/ACT IN AERS: 2.0000 | INHALATION_SPRAY | Freq: Four times a day (QID) | RESPIRATORY_TRACT | Status: DC | PRN

## 2014-04-23 NOTE — Telephone Encounter (Signed)
Sent in RFs and then called at pt's req and LM for pharm to fill w/Ventolin brand if ins will cover, otherwise with brand covered. Pt aware.

## 2014-05-18 ENCOUNTER — Emergency Department (HOSPITAL_COMMUNITY)
Admission: EM | Admit: 2014-05-18 | Discharge: 2014-05-18 | Disposition: A | Discharge status: Home / Self Care | Payer: 59 | Attending: Emergency Medicine | Admitting: Emergency Medicine

## 2014-05-18 ENCOUNTER — Encounter (HOSPITAL_COMMUNITY): Payer: Self-pay | Admitting: Emergency Medicine

## 2014-05-18 DIAGNOSIS — M25531 Pain in right wrist: Secondary | ICD-10-CM

## 2014-05-18 DIAGNOSIS — Z880 Allergy status to penicillin: Secondary | ICD-10-CM | POA: Insufficient documentation

## 2014-05-18 DIAGNOSIS — IMO0002 Reserved for concepts with insufficient information to code with codable children: Secondary | ICD-10-CM | POA: Insufficient documentation

## 2014-05-18 DIAGNOSIS — Z8719 Personal history of other diseases of the digestive system: Secondary | ICD-10-CM | POA: Insufficient documentation

## 2014-05-18 DIAGNOSIS — M25539 Pain in unspecified wrist: Secondary | ICD-10-CM | POA: Insufficient documentation

## 2014-05-18 DIAGNOSIS — M79609 Pain in unspecified limb: Secondary | ICD-10-CM

## 2014-05-18 DIAGNOSIS — Z79899 Other long term (current) drug therapy: Secondary | ICD-10-CM | POA: Insufficient documentation

## 2014-05-18 DIAGNOSIS — M79662 Pain in left lower leg: Secondary | ICD-10-CM

## 2014-05-18 DIAGNOSIS — Z87891 Personal history of nicotine dependence: Secondary | ICD-10-CM | POA: Insufficient documentation

## 2014-05-18 DIAGNOSIS — I10 Essential (primary) hypertension: Secondary | ICD-10-CM | POA: Insufficient documentation

## 2014-05-18 MED ORDER — NAPROXEN 500 MG PO TABS: 500.0000 mg | ORAL_TABLET | Freq: Two times a day (BID) | ORAL | Status: DC

## 2014-05-18 NOTE — ED Provider Notes (Signed)
Medical screening examination/treatment/procedure(s) were performed by non-physician practitioner and as supervising physician I was immediately available for consultation/collaboration.   EKG Interpretation None       Kalman Drape, MD 05/18/14 1409

## 2014-05-18 NOTE — ED Notes (Signed)
Patient presents today with left calf pain and right wrist pain. Patient states the leg has been hurting since last night.She states the pain is worst when she stands to walk and flex.he denies talking anything for the pain.  Calf not warm to touch on assessment.

## 2014-05-18 NOTE — Progress Notes (Signed)
VASCULAR LAB PRELIMINARY  PRELIMINARY  PRELIMINARY  PRELIMINARY  Left lower extremity venous duplex completed.    Preliminary report:  Left:  No evidence of DVT, superficial thrombosis, or Baker's cyst.  Bj Morlock, RVS 05/18/2014, 8:27 AM

## 2014-05-18 NOTE — Discharge Instructions (Signed)
Please follow up closely with your doctor for further care. No evidence of blood clot in your leg on today's evaluation. You also have symptoms suggestive of carpal tunnel syndrome in your right wrist.  You may follow up with orthopedist doctor for further care.   RICE: Routine Care for Injuries The routine care of many injuries includes Rest, Ice, Compression, and Elevation (RICE). HOME CARE INSTRUCTIONS  Rest is needed to allow your body to heal. Routine activities can usually be resumed when comfortable. Injured tendons and bones can take up to 6 weeks to heal. Tendons are the cord-like structures that attach muscle to bone.  Ice following an injury helps keep the swelling down and reduces pain.  Put ice in a plastic bag.  Place a towel between your skin and the bag.  Leave the ice on for 15-20 minutes, 3-4 times a day, or as directed by your health care provider. Do this while awake, for the first 24 to 48 hours. After that, continue as directed by your caregiver.  Compression helps keep swelling down. It also gives support and helps with discomfort. If an elastic bandage has been applied, it should be removed and reapplied every 3 to 4 hours. It should not be applied tightly, but firmly enough to keep swelling down. Watch fingers or toes for swelling, bluish discoloration, coldness, numbness, or excessive pain. If any of these problems occur, remove the bandage and reapply loosely. Contact your caregiver if these problems continue.  Elevation helps reduce swelling and decreases pain. With extremities, such as the arms, hands, legs, and feet, the injured area should be placed near or above the level of the heart, if possible. SEEK IMMEDIATE MEDICAL CARE IF:  You have persistent pain and swelling.  You develop redness, numbness, or unexpected weakness.  Your symptoms are getting worse rather than improving after several days. These symptoms may indicate that further evaluation or further  X-rays are needed. Sometimes, X-rays may not show a small broken bone (fracture) until 1 week or 10 days later. Make a follow-up appointment with your caregiver. Ask when your X-ray results will be ready. Make sure you get your X-ray results. Document Released: 02/05/2001 Document Revised: 10/29/2013 Document Reviewed: 03/25/2011 [employer] Patient Information 2015 [place], [place]. This information is not intended to replace advice given to you by your health care provider. Make sure you discuss any questions you have with your health care provider.

## 2014-05-18 NOTE — ED Provider Notes (Signed)
CSN: [id number]     Arrival date & time 05/18/14  0207 History   First MD Initiated Contact with Patient 05/18/14 [phone]     Chief Complaint  Patient presents with  . Leg Pain     (Consider location/radiation/quality/duration/timing/severity/associated sxs/prior Treatment) HPI  38 year old female presents for evaluation of left calf pain and right wrist pain. Patient is here with a primary complaint of left calf pain. She noticed the pain last night around 10:00. Describe pain as a sharp achy sensation to the back of her calf, worsening with dorsiflexion, and walking. Pain has been persistent, and rated as a 4/10 she did recall her starting a new workout regimen 2 days ago including a leg workout however she did not think is related to working out given the pain is on her left calf and not in the right calf. She denies any recent trauma. No prior history of PE or DVT, no recent surgery, prolonged bed rest, unilateral leg swelling, taking birth control pill, or history of cancer. She does report a family history of having blood clots in which her dad developed a DVT in his left leg that has complications. She is worried about blood clot. Furthermore patient also has a separate complaint which is her right wrist pain. She has been having intermittent sharp achy pain to right wrist for the past 7-8 months. Pain is getting progressively worse. She noticed swelling to the ulnar aspects of her wrist. Pain increased after prolonged repetitive motion. She uses a small brace which provide some relief. She denies any specific injury. She denies any hand numbness or rash. Her job requires a lot of typing. Otherwise she denies any fever, chills, or rash. No chest pain or shortness of breath.  Past Medical History  Diagnosis Date  . Hypertension   . GERD (gastroesophageal reflux disease)    History reviewed. No pertinent past surgical history. Family History  Problem Relation Age of Onset  . Seizures Mother    . Cancer Father     Liver  . Diabetes Father 64  . Hypertension Father   . Heart disease Father     CEA, LE [place]  . Alzheimer's disease Maternal Grandmother   . Heart disease Maternal Grandfather    History  Substance Use Topics  . Smoking status: Former Smoker    Types: Cigarettes    Quit date: 08/07/2012  . Smokeless tobacco: Never Used     Comment: Maybe six cigarerres a week  . Alcohol Use: Yes     Comment: occassionally   OB History   Grav Para Term Preterm Abortions TAB SAB Ect Mult Living   2 0 0 0 2 1 1 0 0 0      Review of Systems  All other systems reviewed and are negative.     Allergies  Penicillins and Tamiflu  Home Medications   Prior to Admission medications   Medication Sig Start Date End Date Taking? Authorizing Provider  albuterol (PROVENTIL HFA;VENTOLIN HFA) 108 (90 BASE) MCG/ACT inhaler Inhale 2 puffs into the lungs every 6 (six) hours as needed. 04/23/14  Yes Barton Fanny, MD  atenolol-chlorthalidone (TENORETIC) 50-25 MG per tablet Take 0.5 tablets by mouth daily.   Yes Historical Provider, MD  cyclobenzaprine (FLEXERIL) 10 MG tablet Take 1 tablet (10 mg total) by mouth 3 (three) times daily as needed for muscle spasms. 03/17/14  Yes [name], MD  fluticasone (FLONASE) 50 MCG/ACT nasal spray Place 1 spray into both nostrils daily  as needed for allergies or rhinitis.   Yes Historical Provider, MD  lisinopril (PRINIVIL,ZESTRIL) 10 MG tablet Take 1 tablet (10 mg total) by mouth daily. PATIENT NEEDS OFFICE VISIT FOR ADDITIONAL REFILLS - 2nd NOTICE 03/17/14  Yes [name], MD  Vitamin D, Ergocalciferol, (DRISDOL) 50000 UNITS CAPS capsule Take 50,000 Units by mouth every 7 (seven) days. Sunday.   Yes Historical Provider, MD   BP 161/93  Pulse 68  Temp(Src) 98.1 F (36.7 C) (Oral)  Resp 16  Ht 5\' 4"  (1.626 m)  Wt 220 lb (99.791 kg)  BMI 37.74 kg/m2  SpO2 99%  LMP 05/18/2014 Physical Exam  Nursing note and vitals  reviewed. Constitutional: She appears well-developed and well-nourished. No distress.  HENT:  Head: Atraumatic.  Mouth/Throat: Oropharynx is clear and moist.  Eyes: Conjunctivae are normal.  Neck: Neck supple.  Cardiovascular: Normal rate and regular rhythm.   Pulmonary/Chest: Effort normal and breath sounds normal.  Musculoskeletal: She exhibits tenderness (R wrist: Normal wrist flexion extension supination and pronation. No deformity noted. No edema or rash. Radial pulse 2+. Normal grip strength. Brisk cap refills to all fingers.).  L calf: varicose vein noted to posterior calf, tender.  No pitting edema, no rash, negative Homan sign.  Intact dorsalis pedis with brisk cap refills to all toes.    Neurological: She is alert.  Skin: No rash noted.  Psychiatric: She has a normal mood and affect.    ED Course  Procedures (including critical care time)  6:39 AM Patient presents with left calf pain. She has low risk for developing DVT. She has no evidence to suggest DVT. However patient is concerned of the possibility of DVT, study ordered. Pain medication however, patient declined.  Patient has right wrist pain, pain is most likely related to carpal tunnel. No evidence of infection, no neurovascular compromise. Will give although referral, recommend wearing wrist brace for support and decrease repetitive motion.  8:51 AM Doppler study of LLE negative for DVT.  Reassurance given.  Pain medication prescribed, f/u instruction provided.  Return precaution discussed.    [place], [place] Female April 16, 1976 [id number]            Progress Notes by Dareen Piano at 05/18/2014 8:27 AM    Author: Dareen Piano Service: Vascular Lab Author Type: Cardiovascular Sonographer   Filed: 05/18/2014 8:28 AM Note Time: 05/18/2014 8:27 AM Status: Signed   Editor: Vilma Prader Slaughter (Cardiovascular Sonographer)      VASCULAR LAB  PRELIMINARY PRELIMINARY PRELIMINARY PRELIMINARY   Left lower extremity venous duplex completed.  Preliminary report: Left: No evidence of DVT, superficial thrombosis, or Baker's cyst.  SLAUGHTER, [place], RVS  05/18/2014, 8:27 AM        Labs Review Labs Reviewed - No data to display  Imaging Review No results found.   EKG Interpretation None      MDM   Final diagnoses:  Calf pain, left  Right wrist pain    BP 161/93  Pulse 68  Temp(Src) 98.1 F (36.7 C) (Oral)  Resp 16  Ht 5\' 4"  (1.626 m)  Wt 220 lb (99.791 kg)  BMI 37.74 kg/m2  SpO2 99%  LMP 05/18/2014  I have reviewed nursing notes and vital signs. I personally reviewed the imaging tests through PACS system  I reviewed available ER/hospitalization records thought the EMR     Domenic Moras, [place] 05/18/14 [id number]

## 2014-08-04 ENCOUNTER — Ambulatory Visit (INDEPENDENT_AMBULATORY_CARE_PROVIDER_SITE_OTHER): Payer: 59 | Admitting: Family Medicine

## 2014-08-04 VITALS — BP 124/80 | HR 63 | Temp 98.0°F | Resp 16 | Ht 62.5 in | Wt 223.8 lb

## 2014-08-04 DIAGNOSIS — M25519 Pain in unspecified shoulder: Secondary | ICD-10-CM

## 2014-08-04 DIAGNOSIS — E559 Vitamin D deficiency, unspecified: Secondary | ICD-10-CM

## 2014-08-04 DIAGNOSIS — S66911S Strain of unspecified muscle, fascia and tendon at wrist and hand level, right hand, sequela: Secondary | ICD-10-CM

## 2014-08-04 DIAGNOSIS — M25512 Pain in left shoulder: Secondary | ICD-10-CM

## 2014-08-04 DIAGNOSIS — G8929 Other chronic pain: Secondary | ICD-10-CM

## 2014-08-04 DIAGNOSIS — R7303 Prediabetes: Secondary | ICD-10-CM

## 2014-08-04 DIAGNOSIS — IMO0001 Reserved for inherently not codable concepts without codable children: Secondary | ICD-10-CM

## 2014-08-04 DIAGNOSIS — K2100 Gastro-esophageal reflux disease with esophagitis, without bleeding: Secondary | ICD-10-CM

## 2014-08-04 DIAGNOSIS — M542 Cervicalgia: Secondary | ICD-10-CM

## 2014-08-04 DIAGNOSIS — M545 Low back pain, unspecified: Secondary | ICD-10-CM

## 2014-08-04 DIAGNOSIS — IMO0002 Reserved for concepts with insufficient information to code with codable children: Secondary | ICD-10-CM

## 2014-08-04 DIAGNOSIS — R7309 Other abnormal glucose: Secondary | ICD-10-CM

## 2014-08-04 DIAGNOSIS — M791 Myalgia, unspecified site: Secondary | ICD-10-CM

## 2014-08-04 DIAGNOSIS — M2550 Pain in unspecified joint: Secondary | ICD-10-CM

## 2014-08-04 DIAGNOSIS — R072 Precordial pain: Secondary | ICD-10-CM

## 2014-08-04 LAB — POCT GLYCOSYLATED HEMOGLOBIN (HGB A1C): Hemoglobin A1C: 6.1

## 2014-08-04 LAB — POCT CBC
Granulocyte percent: 48.8 %G (ref 37–80)
HCT, POC: 42.5 % (ref 37.7–47.9)
Hemoglobin: 13.8 g/dL (ref 12.2–16.2)
Lymph, poc: 5.4 — AB (ref 0.6–3.4)
MCH, POC: 29.5 pg (ref 27–31.2)
MCHC: 32.4 g/dL (ref 31.8–35.4)
MCV: 91 fL (ref 80–97)
MID (cbc): 0.5 (ref 0–0.9)
MPV: 7.1 fL (ref 0–99.8)
POC Granulocyte: 5.6 (ref 2–6.9)
POC LYMPH PERCENT: 47 %L (ref 10–50)
POC MID %: 4.2 %M (ref 0–12)
Platelet Count, POC: 323 10*3/uL (ref 142–424)
RBC: 4.66 M/uL (ref 4.04–5.48)
RDW, POC: 13.4 %
WBC: 11.5 10*3/uL — AB (ref 4.6–10.2)

## 2014-08-04 LAB — GLUCOSE, POCT (MANUAL RESULT ENTRY): POC Glucose: 91 mg/dl (ref 70–99)

## 2014-08-04 MED ORDER — MELOXICAM 15 MG PO TABS: 15.0000 mg | ORAL_TABLET | Freq: Every day | ORAL | Status: DC

## 2014-08-04 MED ORDER — CYCLOBENZAPRINE HCL 10 MG PO TABS: 10.0000 mg | ORAL_TABLET | Freq: Every day | ORAL | Status: DC

## 2014-08-04 NOTE — Progress Notes (Signed)
Subjective:  This chart was scribed for [name], MD by Starleen Arms, Medical Scribe. This patient was seen in room 11 and the patient's care was started at 8:06 PM.   Patient ID: Brooke Turner, female    DOB: 08-02-1976, 38 y.o.   MRN: [id number] Chief Complaint  Patient presents with  . Spasms    pt states she has been having recurrent spasms on her left chest/shoulder area  . Back Pain    pt states she is having a lot of back pain;  . Wrist Pain    pt states she believes she has carpal tunnel in her right wrist    HPI  HPI Comments: Brooke Turner is a 38 y.o. female who had a lumbar spine x-ray 1 year ago.  Patient had a neck x-ray 8 months previously that was normal.  Patient is prescribed cyclobenzaprine for muscle spasms.  She presents to the [facility] complaining of gradually worsening left-sided jaw, left-sided neck, bilateral occipital, left chest with radiation through to shoulder blade, left shoulder, left arm pain and spasms.  Patient reports the arm spasms are worsened with use including using a door handle and lifting weights.  Patient denies being able to visualize a muscle twitch Patient also complains of concurrent back pain.  Patient reports a history of occipital headaches described as "soreness".  Patient reported that the "tightness" has existed for several months but became significantly worse yesterday.  Patient reports that a tingling sensation onset in hr left hand yesterday in conjunction with a worsening of her spasms, prompting her to come to [facility] today.   Patient reports that she had a fall 3 years ago with no fracture.  Patient denies injury, restless legs.    Patient reports that she rarely sleeps through the night and awakes to use the bathroom multiple times.  Patient reports that she sleeps on her back but often turns in the night onto both sides.  Patient reports that she snores.  Patient states that the muscle pain and spasms of complaint today  are sometimes worse in the morning.  Patient reports that she uses three pillows to sleep at night.    Patient reports she has been recently been to the gym doing a combination of cardiovascular exercise and weight-training.  She reports that running sometimes worsen her back pain.    Patient also complains of gradually worsening right wrist pain described as throbbing that is worse in the morning.  Patient reports that she currently wears a brace daily at work, especially when typing.  She states she intermittently wears the brace at night.    Patient works as a Biomedical engineer.    Patient denies use of naproxen.  Patient denies current use of cyclobenzaprine but states that it seemed to help her with her previous muscle spasms.   Patient reports that she has been taking 500 mg ibuprofen twice/day: morning and evening.  Patient states she is supposed to take calcium and vitamin D but has recently run out.  She plans to refill these medications soon.    Patient reports that she has worn a "waist-fitter" in the past that helped with her back pain and is interested to know if insurance would assume responsibility for this purchase.  Patient reports she is interested in receiving an MRI to determine the cause of today's complaints.     Past Medical History  Diagnosis Date  . Hypertension   . GERD (gastroesophageal reflux disease)  Current Outpatient Prescriptions on File Prior to Visit  Medication Sig Dispense Refill  . albuterol (PROVENTIL HFA;VENTOLIN HFA) 108 (90 BASE) MCG/ACT inhaler Inhale 2 puffs into the lungs every 6 (six) hours as needed.  1 Inhaler  3  . atenolol-chlorthalidone (TENORETIC) 50-25 MG per tablet Take 0.5 tablets by mouth daily.      . cyclobenzaprine (FLEXERIL) 10 MG tablet Take 1 tablet (10 mg total) by mouth 3 (three) times daily as needed for muscle spasms.  30 tablet  0  . fluticasone (FLONASE) 50 MCG/ACT nasal spray Place 1 spray into both nostrils daily  as needed for allergies or rhinitis.      Marland Kitchen lisinopril (PRINIVIL,ZESTRIL) 10 MG tablet Take 1 tablet (10 mg total) by mouth daily. PATIENT NEEDS OFFICE VISIT FOR ADDITIONAL REFILLS - 2nd NOTICE  90 tablet  1  . naproxen (NAPROSYN) 500 MG tablet Take 1 tablet (500 mg total) by mouth 2 (two) times daily.  30 tablet  0  . Vitamin D, Ergocalciferol, (DRISDOL) 50000 UNITS CAPS capsule Take 50,000 Units by mouth every 7 (seven) days. Sunday.       No current facility-administered medications on file prior to visit.   Allergies  Allergen Reactions  . Penicillins Hives  . Tamiflu [Oseltamivir Phosphate] Nausea And Vomiting     Review of Systems  Cardiovascular: Positive for chest pain.  Musculoskeletal: Positive for arthralgias, back pain, myalgias and neck pain.  Neurological: Positive for headaches.  Psychiatric/Behavioral: Positive for sleep disturbance.       Objective:  BP 124/80  Pulse 63  Temp(Src) 98 F (36.7 C) (Oral)  Resp 16  Ht 5' 2.5" (1.588 m)  Wt 223 lb 12.8 oz (101.515 kg)  BMI 40.26 kg/m2  SpO2 100%  LMP 07/21/2014  Physical Exam  Nursing note and vitals reviewed. Constitutional: She is oriented to person, place, and time. She appears well-developed and well-nourished. No distress.  HENT:  Head: Normocephalic and atraumatic.  Eyes: Conjunctivae and EOM are normal.  Neck: Neck supple. No tracheal deviation present.  Negative Spurling.  No spinous process tenderness of cervical, thoracic or lumbar sine.  Pain bilaterally over occipital groove and occipital nerve.  Postive bilateral cervical paraspinal muscle spasm and tenderness.  Pain and spasm in upper left lumbar paraspinal and mid-right.  Full cervical range of motion   Cardiovascular: Normal rate.   Pulmonary/Chest: Effort normal and breath sounds normal. No respiratory distress. She has no wheezes. She has no rales.  Musculoskeletal: Normal range of motion.  Mild tenderness over acromioclavicular joint and  costosternal junction.    Neurological: She is alert and oriented to person, place, and time.  5/5 finger strength.  Negative Tinel's, Phalen's, reverse Phalen's.  2+ bicep/tricep and brachioradialis bilaterally.  2+ patellar and achilles bilaterally.  Strength 5/5 throughout.   Skin: Skin is warm and dry.  Psychiatric: She has a normal mood and affect. Her behavior is normal.       Assessment & Plan:  8:31 PM Advised Patient to restart vitamin D and calcium supplements and recommend trial of magnesium supplement.  Will provide referral to [facility] due to multiple joint complaints and pt request for specialist eval.  Advised patient to continue use of cyclobenzaprine at night prn and meloxicam qam. Patient expressed interest in getting MRI but will defer to ortho since she has multiple complaints - not clear what to MRI.  Pain in joint, shoulder region, left - Plan: POCT CBC, POCT glycosylated hemoglobin (Hb A1C), Sedimentation  rate, C-reactive protein, CK, Magnesium, POCT glucose (manual entry), Ambulatory referral to Orthopedic Surgery - will need xray but will defer to ortho.  Neck pain, chronic - Plan: POCT CBC, POCT glycosylated hemoglobin (Hb A1C), Sedimentation rate, C-reactive protein, CK, Magnesium, POCT glucose (manual entry), Ambulatory referral to Orthopedic Surgery  Wrist strain, right, sequela - Plan: POCT CBC, POCT glycosylated hemoglobin (Hb A1C), Sedimentation rate, C-reactive protein, CK, Magnesium, POCT glucose (manual entry), Ambulatory referral to Orthopedic Surgery  Myalgia - Plan: POCT CBC, POCT glycosylated hemoglobin (Hb A1C), Sedimentation rate, C-reactive protein, CK, Magnesium, POCT glucose (manual entry)  Arthralgia - Plan: POCT CBC, POCT glycosylated hemoglobin (Hb A1C), Sedimentation rate, C-reactive protein, CK, Magnesium, POCT glucose (manual entry)  Precordial pain - Plan: Ambulatory referral to Orthopedic Surgery, Ambulatory referral to Cardiology  - atypical but pt req cardiology eval due to risks of pre-DM and atypical pain in females so refer for stress test.  Bilateral low back pain without sciatica - Plan: Ambulatory referral to Orthopedic Surgery  Gastroesophageal reflux disease with esophagitis  Pre-diabetes - a1c improved from 6.4 to 6.1!! Keep up the good work w/ tlc.  Vitamin D deficiency - cont replacement - recheck at f/u  Meds ordered this encounter  Medications  . DISCONTD: cyclobenzaprine (FLEXERIL) 10 MG tablet    Sig: Take 1 tablet (10 mg total) by mouth at bedtime.    Dispense:  30 tablet    Refill:  0  . meloxicam (MOBIC) 15 MG tablet    Sig: Take 1 tablet (15 mg total) by mouth daily.    Dispense:  30 tablet    Refill:  0    I personally performed the services described in this documentation, which was scribed in my presence. The recorded information has been reviewed and considered, and addended by me as needed.  [name], MD MPH  Results for orders placed in visit on 08/04/14  SEDIMENTATION RATE      Result Value Ref Range   Sed Rate 4  0 - 22 mm/hr  C-REACTIVE PROTEIN      Result Value Ref Range   CRP 1.0 (*) <0.60 mg/dL  CK      Result Value Ref Range   Total CK 144  7 - 177 U/L  MAGNESIUM      Result Value Ref Range   Magnesium 1.6  1.5 - 2.5 mg/dL  POCT CBC      Result Value Ref Range   WBC 11.5 (*) 4.6 - 10.2 K/uL   Lymph, poc 5.4 (*) 0.6 - 3.4   POC LYMPH PERCENT 47.0  10 - 50 %L   MID (cbc) 0.5  0 - 0.9   POC MID % 4.2  0 - 12 %M   POC Granulocyte 5.6  2 - 6.9   Granulocyte percent 48.8  37 - 80 %G   RBC 4.66  4.04 - 5.48 M/uL   Hemoglobin 13.8  12.2 - 16.2 g/dL   HCT, POC 42.5  37.7 - 47.9 %   MCV 91.0  80 - 97 fL   MCH, POC 29.5  27 - 31.2 pg   MCHC 32.4  31.8 - 35.4 g/dL   RDW, POC 13.4     Platelet Count, POC 323  142 - 424 K/uL   MPV 7.1  0 - 99.8 fL  POCT GLYCOSYLATED HEMOGLOBIN (HGB A1C)      Result Value Ref Range   Hemoglobin A1C 6.1    GLUCOSE, POCT (MANUAL RESULT  ENTRY)      Result Value Ref Range   POC Glucose 91  70 - 99 mg/dl

## 2014-08-04 NOTE — Patient Instructions (Signed)
I have referred you to Dr. Antonietta Jewel at [facility] but that same clinic has an after hours walk-in clinic if you would like to be seen there to get your care started - maybe then they could decide about what imaging you need. EVENINGS & WEEKENDS - NO APPOINTMENT NECESSARY  At [facility] clinic. [phone] Mon-Fri   5:30 PM - 9 PM; Sat-Sun 10:00 AM - 2 PM Take the cyclobenzaprine every night and the meloxicam every morning. No other pain medicines over the counter other than generic tylenol/acetaminophen.   [facility] has after hours and weekend PT appts - if you want to proceed with that let me know but otherwise the orthopedist should be able to treat you best.    Burners or Stingers, Brachial Plexus Stretch Injury Burners or stingers are injuries which commonly happen in football or any other injury that stretches or injures the nerves from the neck to your arm. The nerves which come out of your neck and supply the arm and hand on both sides are called the brachial plexus. These nerves come from the spinal cord and wind down through the neck and under the collarbone and end as the nerves in your arms. They carry messages to and from the arm and hand. They allow you to move your arm and to also have feeling in your arms and hands.  SYMPTOMS   The main problem is a burning pain that starts in the neck and shoots into the arm.  There may be numbness, weakness or brief paralysis of the arm.  There may be needles and pins feelings which run from the neck and down the arm.  The type of problem depends on which nerves are damaged.  Shoulder weakness and muscle tenderness of the neck may occur from hours up to days after the injury or sometimes not at all. DIAGNOSIS   Your caregiver can usually diagnose this based on the history of injury, symptoms, and a physical examination.  Electromyography and nerve conduction tests may be helpful. These are tests to tell how well your  nerves are working. (Your nerves are like the wires carrying electricity in your house).  Specialized x-rays may be done to make sure there is not an injury to the cervical spine (neck) or shoulder. HOME CARE INSTRUCTIONS   Apply ice to the shoulder and axilla (armpit) for 15-20 minutes, 03-04 times per day while awake for the first 2 days. Put the ice in a plastic bag and place a towel between the bag of ice and your skin.  Only take over-the-counter or prescription medicines for pain, discomfort, or fever as directed by your caregiver.  If you were given a splint or sling, wear them as instructed. You may remove them to shower.  An elastic bandage wrap may be useful for shoulder or upper-arm swelling. RETURNING TO ACTIVITIES  You will not be able to return to activities in a contact sport until full strength and range of motion of the upper extremity and neck returns to normal, and EMG studies are negative. This means the injured nerves do not show continual problems. This can take more than 6 months. There should be no residual pain.  You can maintain your cardiovascular fitness by continuing to work out your lower extremities. PREVENTION   Prevention measures include strengthening exercises of the neck and shoulder muscles and protective devices for your neck before starting sports again if that was the cause.  More than half of  these injuries will happen again. It is very important to do the strengthening exercises recommended.  Wear neck and shoulder pads that are well fitted to prevent re-injury. Sometimes it is impossible to tell the difference between an injury to the nerves that supply the arm and the spinal cord. If there is injury to the spinal cord your symptoms may worsen. It is important to pay attention to your condition. If there is any worsening of your condition, or if your condition does not improve, contact your caregiver or go to an emergency department. SEEK IMMEDIATE  MEDICAL CARE IF:   You develop severe neck pain.  You lose control of your urine or stool.  You develop weakness in your arms or legs. MAKE SURE YOU:   Understand these instructions.  Will watch your condition.  Will get help right away if you are not doing well or get worse. Document Released: 01/14/2004 Document Revised: 01/16/2012 Document Reviewed: 06/11/2008 [facility] Patient Information 2015 [place], [place]. This information is not intended to replace advice given to you by your health care provider. Make sure you discuss any questions you have with your health care provider.

## 2014-08-05 LAB — C-REACTIVE PROTEIN: CRP: 1 mg/dL — ABNORMAL HIGH (ref <0.60)

## 2014-08-05 LAB — MAGNESIUM: Magnesium: 1.6 mg/dL (ref 1.5–2.5)

## 2014-08-05 LAB — CK: Total CK: 144 U/L (ref 7–177)

## 2014-08-06 ENCOUNTER — Telehealth: Payer: Self-pay | Admitting: Cardiovascular Disease

## 2014-08-06 LAB — SEDIMENTATION RATE: Sed Rate: 4 mm/hr (ref 0–22)

## 2014-08-06 NOTE — Telephone Encounter (Signed)
Closed encounter °

## 2014-08-24 ENCOUNTER — Encounter (HOSPITAL_COMMUNITY): Payer: Self-pay | Admitting: Emergency Medicine

## 2014-08-24 ENCOUNTER — Emergency Department (HOSPITAL_COMMUNITY)
Admission: EM | Admit: 2014-08-24 | Discharge: 2014-08-24 | Disposition: A | Discharge status: Home / Self Care | Payer: 59 | Attending: Emergency Medicine | Admitting: Emergency Medicine

## 2014-08-24 DIAGNOSIS — Z880 Allergy status to penicillin: Secondary | ICD-10-CM | POA: Insufficient documentation

## 2014-08-24 DIAGNOSIS — F410 Panic disorder [episodic paroxysmal anxiety] without agoraphobia: Secondary | ICD-10-CM

## 2014-08-24 DIAGNOSIS — I10 Essential (primary) hypertension: Secondary | ICD-10-CM | POA: Insufficient documentation

## 2014-08-24 DIAGNOSIS — E876 Hypokalemia: Secondary | ICD-10-CM | POA: Diagnosis not present

## 2014-08-24 DIAGNOSIS — Z720 Tobacco use: Secondary | ICD-10-CM | POA: Insufficient documentation

## 2014-08-24 DIAGNOSIS — Z8719 Personal history of other diseases of the digestive system: Secondary | ICD-10-CM | POA: Diagnosis not present

## 2014-08-24 DIAGNOSIS — Z791 Long term (current) use of non-steroidal anti-inflammatories (NSAID): Secondary | ICD-10-CM | POA: Diagnosis not present

## 2014-08-24 DIAGNOSIS — Z79899 Other long term (current) drug therapy: Secondary | ICD-10-CM | POA: Diagnosis not present

## 2014-08-24 DIAGNOSIS — R0789 Other chest pain: Secondary | ICD-10-CM | POA: Diagnosis present

## 2014-08-24 LAB — I-STAT CHEM 8, ED
BUN: 12 mg/dL (ref 6–23)
Calcium, Ion: 0.97 mmol/L — ABNORMAL LOW (ref 1.12–1.23)
Chloride: 100 mEq/L (ref 96–112)
Creatinine, Ser: 1.1 mg/dL (ref 0.50–1.10)
Glucose, Bld: 261 mg/dL — ABNORMAL HIGH (ref 70–99)
HCT: 43 % (ref 36.0–46.0)
Hemoglobin: 14.6 g/dL (ref 12.0–15.0)
Potassium: 2.4 mEq/L — CL (ref 3.7–5.3)
Sodium: 138 mEq/L (ref 137–147)
TCO2: 18 mmol/L (ref 0–100)

## 2014-08-24 LAB — TSH: TSH: 2.08 u[IU]/mL (ref 0.350–4.500)

## 2014-08-24 MED ORDER — SODIUM CHLORIDE 0.9 % IV BOLUS (SEPSIS)
1000.0000 mL | Freq: Once | INTRAVENOUS | Status: AC
  Administered 2014-08-24: 1000 mL via INTRAVENOUS

## 2014-08-24 MED ORDER — POTASSIUM CHLORIDE 10 MEQ/100ML IV SOLN
10.0000 meq | INTRAVENOUS | Status: AC
  Administered 2014-08-24 (×2): 10 meq via INTRAVENOUS
  Filled 2014-08-24: qty 100

## 2014-08-24 MED ORDER — POTASSIUM CHLORIDE ER 10 MEQ PO TBCR: 10.0000 meq | EXTENDED_RELEASE_TABLET | Freq: Every day | ORAL | Status: DC

## 2014-08-24 MED ORDER — LORAZEPAM 2 MG/ML IJ SOLN
1.0000 mg | Freq: Once | INTRAMUSCULAR | Status: AC
  Administered 2014-08-24: 1 mg via INTRAVENOUS
  Filled 2014-08-24: qty 1

## 2014-08-24 MED ORDER — POTASSIUM CHLORIDE CRYS ER 20 MEQ PO TBCR
40.0000 meq | EXTENDED_RELEASE_TABLET | Freq: Once | ORAL | Status: AC
  Administered 2014-08-24: 40 meq via ORAL
  Filled 2014-08-24: qty 2

## 2014-08-24 NOTE — ED Notes (Signed)
Pt to ED via [facility] c/o "fast heart." EMS reports SVT on arrival, did not give adenosine due to patient refusal. Hx of tachycardia, takes atenolol and lisinopril. ETOH on board. Reports a friend made her a mixed drink in which she began to feel bad.

## 2014-08-24 NOTE — ED Notes (Signed)
Pt requesting blood drug test and urine drug test

## 2014-08-24 NOTE — ED Notes (Signed)
Pt c/o L chest pain; reports has been intermittently present x 1 week, worsening tonight. Has seen PCP for same symptoms

## 2014-08-24 NOTE — ED Provider Notes (Signed)
CSN: [id number]     Arrival date & time 08/24/14  0025 History   First MD Initiated Contact with Patient 08/24/14 0040     No chief complaint on file.    HPI Patient presents complaining of fast heartbeat.  She feels as though she's having some mild tightness in her chest and feels flushed in her face and warm all over.  She states that she was with a friend and did have a [place].  Shortly after that she began feeling abnormal.  No prior history of cardiac disease.  She states that she's had a fast heart rate before in the past but has never seen a cardiologist.  She's never been placed on a Holter monitor.  She is on atenolol from her primary care physician for this.  No recent illness.  No shortness of breath.  No recent increase in her caffeine intake.  She does drink Coca-Cola but did not change her amount today.  Denies drug abuse.  No over-the-counter diet pills or supplements.   Past Medical History  Diagnosis Date  . Hypertension   . GERD (gastroesophageal reflux disease)   . Tachycardia    No past surgical history on file. Family History  Problem Relation Age of Onset  . Seizures Mother   . Cancer Father     Liver  . Diabetes Father 78  . Hypertension Father   . Heart disease Father     CEA, LE [place]  . Alzheimer's disease Maternal Grandmother   . Heart disease Maternal Grandfather    History  Substance Use Topics  . Smoking status: Current Every Day Smoker    Types: Cigarettes    Last Attempt to Quit: 08/07/2012  . Smokeless tobacco: Never Used     Comment: Maybe six cigarerres a week  . Alcohol Use: Yes     Comment: occassionally   OB History   Grav Para Term Preterm Abortions TAB SAB Ect Mult Living   2 0 0 0 2 1 1 0 0 0      Review of Systems  All other systems reviewed and are negative.     Allergies  Penicillins and Tamiflu  Home Medications   Prior to Admission medications   Medication Sig Start Date End Date Taking? Authorizing Provider   albuterol (PROVENTIL HFA;VENTOLIN HFA) 108 (90 BASE) MCG/ACT inhaler Inhale 2 puffs into the lungs every 6 (six) hours as needed for wheezing or shortness of breath.   Yes Historical Provider, MD  atenolol-chlorthalidone (TENORETIC) 50-25 MG per tablet Take 0.5 tablets by mouth daily.   Yes Historical Provider, MD  cyclobenzaprine (FLEXERIL) 10 MG tablet Take 10 mg by mouth daily as needed for muscle spasms.   Yes Historical Provider, MD  fluticasone (FLONASE) 50 MCG/ACT nasal spray Place 1 spray into both nostrils daily as needed for allergies or rhinitis.   Yes Historical Provider, MD  lisinopril (PRINIVIL,ZESTRIL) 10 MG tablet Take 1 tablet (10 mg total) by mouth daily. PATIENT NEEDS OFFICE VISIT FOR ADDITIONAL REFILLS - 2nd NOTICE 03/17/14  Yes [name], MD  Multiple Vitamins-Minerals (HAIR/SKIN/NAILS PO) Take 2 tablets by mouth daily.   Yes Historical Provider, MD  meloxicam (MOBIC) 15 MG tablet Take 1 tablet (15 mg total) by mouth daily. 08/04/14   [name], MD   BP 115/53  Pulse 97  Temp(Src) 98 F (36.7 C) (Oral)  Resp 20  SpO2 100%  LMP 08/10/2014 Physical Exam  Nursing note and vitals reviewed. Constitutional: She is  oriented to person, place, and time. She appears well-developed and well-nourished. No distress.  HENT:  Head: Normocephalic and atraumatic.  Eyes: EOM are normal.  Neck: Normal range of motion.  Cardiovascular: Regular rhythm and normal heart sounds.   Tachycardia  Pulmonary/Chest: Effort normal and breath sounds normal.  Abdominal: Soft. She exhibits no distension. There is no tenderness.  Musculoskeletal: Normal range of motion.  Neurological: She is alert and oriented to person, place, and time.  Skin: Skin is warm and dry.  Psychiatric:  Anxious appearing    ED Course  Procedures (including critical care time) Labs Review Labs Reviewed  I-STAT CHEM 8, ED - Abnormal; Notable for the following:    Potassium 2.4 (*)    Glucose, Bld 261 (*)     Calcium, Ion 0.97 (*)    All other components within normal limits  TSH    Imaging Review No results found.   EKG Interpretation   Date/Time:  Sunday August 24 2014 00:32:12 EDT Ventricular Rate:  138 PR Interval:  145 QRS Duration: 97 QT Interval:  286 QTC Calculation: 433 R Axis:   39 Text Interpretation:  Sinus tachycardia Borderline ST depression, diffuse  leads No significant change was found Confirmed by Constantine Ruddick  MD, Gad Aymond  ([id number]) on 08/24/2014 12:44:22 AM      MDM   Final diagnoses:  Panic attack    Patient with panic attack on arrival.  Her EKG is sinus tachycardia.  Doubt atrial flutter.  She appears very anxious.  The patient will be treated with Ativan and IV fluids  Significant improvement in her symptoms after Ativan.  Her heart rate is coming down with anxiolytics and fluids.  She is noted to be hypokalemic.  Potassium will be replaced  5:32 AM HR 97- NSR on monitor. P waves present. No arrhythmia noted  Discharge home in good condition.  Outpatient PCP followup.  Her potassium will need to be rechecked next week.  This was expressed to the patient.    Hoy Morn, MD 08/24/14 [phone]

## 2014-08-24 NOTE — ED Notes (Signed)
Pt asking multiple questions about dying, sts "I feel warm and weak like I'm going to pass out. Am I ok? Am I going to die?" Pt takes Ativan at home for anxiety.

## 2014-08-24 NOTE — ED Notes (Signed)
PT concerned about mold and /or CO poisoning from her home.

## 2014-08-26 ENCOUNTER — Ambulatory Visit (INDEPENDENT_AMBULATORY_CARE_PROVIDER_SITE_OTHER): Payer: 59 | Admitting: Family Medicine

## 2014-08-26 ENCOUNTER — Encounter: Payer: Self-pay | Admitting: Family Medicine

## 2014-08-26 VITALS — BP 122/84 | HR 69 | Temp 98.9°F | Resp 18 | Ht 62.75 in | Wt 219.6 lb

## 2014-08-26 DIAGNOSIS — F410 Panic disorder [episodic paroxysmal anxiety] without agoraphobia: Secondary | ICD-10-CM

## 2014-08-26 DIAGNOSIS — R000 Tachycardia, unspecified: Secondary | ICD-10-CM

## 2014-08-26 DIAGNOSIS — R002 Palpitations: Secondary | ICD-10-CM

## 2014-08-26 DIAGNOSIS — R7309 Other abnormal glucose: Secondary | ICD-10-CM

## 2014-08-26 DIAGNOSIS — E876 Hypokalemia: Secondary | ICD-10-CM

## 2014-08-26 DIAGNOSIS — R7303 Prediabetes: Secondary | ICD-10-CM | POA: Insufficient documentation

## 2014-08-26 DIAGNOSIS — N949 Unspecified condition associated with female genital organs and menstrual cycle: Secondary | ICD-10-CM

## 2014-08-26 LAB — POCT CBC
Granulocyte percent: 47.6 %G (ref 37–80)
HCT, POC: 42.3 % (ref 37.7–47.9)
Hemoglobin: 13.5 g/dL (ref 12.2–16.2)
Lymph, poc: 5.5 — AB (ref 0.6–3.4)
MCH, POC: 29 pg (ref 27–31.2)
MCHC: 32 g/dL (ref 31.8–35.4)
MCV: 90.9 fL (ref 80–97)
MID (cbc): 0.3 (ref 0–0.9)
MPV: 6.9 fL (ref 0–99.8)
POC Granulocyte: 5.2 (ref 2–6.9)
POC LYMPH PERCENT: 49.9 %L (ref 10–50)
POC MID %: 2.5 %M (ref 0–12)
Platelet Count, POC: 317 10*3/uL (ref 142–424)
RBC: 4.65 M/uL (ref 4.04–5.48)
RDW, POC: 13.4 %
WBC: 11 10*3/uL — AB (ref 4.6–10.2)

## 2014-08-26 LAB — POCT UA - MICROSCOPIC ONLY
Casts, Ur, LPF, POC: NEGATIVE
Crystals, Ur, HPF, POC: NEGATIVE
Mucus, UA: NEGATIVE
RBC, urine, microscopic: NEGATIVE
Yeast, UA: NEGATIVE

## 2014-08-26 LAB — POCT URINALYSIS DIPSTICK
Bilirubin, UA: NEGATIVE
Glucose, UA: NEGATIVE
Ketones, UA: NEGATIVE
Leukocytes, UA: NEGATIVE
Nitrite, UA: NEGATIVE
Protein, UA: NEGATIVE
Spec Grav, UA: 1.02
Urobilinogen, UA: 0.2
pH, UA: 5.5

## 2014-08-26 LAB — POCT WET PREP WITH KOH
Clue Cells Wet Prep HPF POC: NEGATIVE
KOH Prep POC: NEGATIVE
RBC Wet Prep HPF POC: NEGATIVE
Trichomonas, UA: NEGATIVE
Yeast Wet Prep HPF POC: NEGATIVE

## 2014-08-26 LAB — GLUCOSE, POCT (MANUAL RESULT ENTRY): POC Glucose: 95 mg/dl (ref 70–99)

## 2014-08-26 MED ORDER — ALPRAZOLAM 0.5 MG PO TBDP: 0.5000 mg | ORAL_TABLET | Freq: Three times a day (TID) | ORAL | Status: DC | PRN
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
## 2014-08-27 ENCOUNTER — Telehealth: Payer: Self-pay

## 2014-08-27 DIAGNOSIS — R000 Tachycardia, unspecified: Secondary | ICD-10-CM

## 2014-08-27 DIAGNOSIS — R002 Palpitations: Secondary | ICD-10-CM

## 2014-08-27 DIAGNOSIS — H538 Other visual disturbances: Secondary | ICD-10-CM

## 2014-08-27 LAB — COMPREHENSIVE METABOLIC PANEL
ALT: 13 U/L (ref 0–35)
AST: 16 U/L (ref 0–37)
Albumin: 4.1 g/dL (ref 3.5–5.2)
Alkaline Phosphatase: 65 U/L (ref 39–117)
BUN: 10 mg/dL (ref 6–23)
CO2: 24 mEq/L (ref 19–32)
Calcium: 9.5 mg/dL (ref 8.4–10.5)
Chloride: 103 mEq/L (ref 96–112)
Creat: 0.93 mg/dL (ref 0.50–1.10)
Glucose, Bld: 96 mg/dL (ref 70–99)
Potassium: 3.7 mEq/L (ref 3.5–5.3)
Sodium: 139 mEq/L (ref 135–145)
Total Bilirubin: 0.4 mg/dL (ref 0.2–1.2)
Total Protein: 7 g/dL (ref 6.0–8.3)

## 2014-08-27 NOTE — Telephone Encounter (Signed)
if she is having these events so frequently I would recommend she go to the ER and ask that they watch her for a 24 hour cardiac observation on telemetry as if it happening so freq they should be able to catch something - esp if it was NOT anxiety related and is due to an underlying medical problem that it is unlikely to stop when in the ER or hosp so should be fairly easy to catch an episode during monitoring.  If she is in the ER for 24 hrs obs and it doesn't occur than that would support the panic theory. If pt refuses to go to ER when could try to order a 24 hr event monitor on pt - she has a cardiology appt sched for 11/11 so we can see if either they can work her in this week or if we can go ahead and order the monitor.  However, even with the monitor it will take several days after her wearing it for a tech to review the results and send a report so with the acuity of her symptoms or concerns should go to ER

## 2014-08-27 NOTE — Telephone Encounter (Signed)
Pt notified, she is going out of town but if the sxs con't, she will go to the ER

## 2014-08-27 NOTE — Telephone Encounter (Signed)
Pt called back and reported that she is concerned bc she has had two episodes this morning of heart racing unrelated to any stress or anxiety. She was getting ready for work this morning and heart started racing out of the blue, and then again when she was sitting at work. She was not anxious when occurred, although she became worried afterward bc of the randomness and increase in episodes. She stated that she got chills when she was having palpitations which continued after they stopped, and then she became hot. She also feels more fatigued than normal and has a feeling of "fogginess" in her head. Also reports that her head felt pressure after episodes and had some "dimness" in sight briefly like she was going to pass out. She doesn't feel that these are related to anxiety or panic attacks. Dr Brigitte Pulse, please advise. Do you want to order any further testing?

## 2014-08-27 NOTE — Telephone Encounter (Signed)
LM for rtn call-  Has pt tried to take the Xanax as Dr. Brigitte Pulse recommended?

## 2014-08-27 NOTE — Telephone Encounter (Signed)
Pt called in and was seen yesterday by Dr Brigitte Pulse and she told the pt is she had any more issues to let her know. Pt states she has two more incidents of increased HR this am. She is very concerned. She can be reached @ [phone]. Thank you

## 2014-08-27 NOTE — Telephone Encounter (Signed)
Called pt but general VM so did not leave details- left message to call back to speak with TL

## 2014-08-28 LAB — VITAMIN D 25 HYDROXY (VIT D DEFICIENCY, FRACTURES): Vit D, 25-Hydroxy: 26 ng/mL — ABNORMAL LOW (ref 30–89)

## 2014-08-29 ENCOUNTER — Telehealth: Payer: Self-pay | Admitting: *Deleted

## 2014-08-29 LAB — SEDIMENTATION RATE

## 2014-08-29 NOTE — Telephone Encounter (Signed)
No just cancel lab

## 2014-08-29 NOTE — Telephone Encounter (Signed)
According to [facility] the lavender tube required to perform the sed rate was not received with the other blood that was sent out on 10/20. Would you like pt to come back in to have this redrawn?

## 2014-08-30 ENCOUNTER — Encounter: Payer: Self-pay | Admitting: Family Medicine

## 2014-09-02 ENCOUNTER — Ambulatory Visit (INDEPENDENT_AMBULATORY_CARE_PROVIDER_SITE_OTHER): Payer: 59 | Admitting: Physician Assistant

## 2014-09-02 VITALS — BP 124/82 | HR 62 | Temp 98.5°F | Resp 16 | Ht 63.0 in | Wt 217.5 lb

## 2014-09-02 DIAGNOSIS — E876 Hypokalemia: Secondary | ICD-10-CM

## 2014-09-02 DIAGNOSIS — R002 Palpitations: Secondary | ICD-10-CM

## 2014-09-02 DIAGNOSIS — R000 Tachycardia, unspecified: Secondary | ICD-10-CM

## 2014-09-02 NOTE — Telephone Encounter (Signed)
Pt called answering service last night at 2 a.m. - had an episode of tachycardia and palpitations with blurred vision last night while driving home from [place] of state trip - had to pull over - cbg was 224, took some cinnamon to try to lower cbg.  Pt advised that this cbg is no likely high enough to cause any of her sxs - advised to drink water and eat low carb, low sugar, high protein food. Ok to try 1/2 tab lorazepam but should not drive with this. Pt agreeable to plan. We should order event monitor asap - will need to be done prior to cardiology visit - hopefully can get started this week.  Does pt have a home BP cuff? Cont to monitor cbgs during episode and take BP and pulse/HR as well if she can.  She also should come back in to re-evaluate the recent elevated cbgs - so we can do labs to ensure that she has not had a recent sudden worsening of her cbgs in the past 1-2 wks.

## 2014-09-02 NOTE — Progress Notes (Signed)
   Subjective:    Patient ID: Brooke Turner, female    DOB: [birth year], 38 y.o.   MRN: [id number]  HPI  Pt presents to clinic wanting to get her cardiac event monitor placed.  She misunderstood and thought that she would come here to have it placed.  She is still having problems with episodes of feeling like she is going to pass out and then a fast heart rate with some associated shortness of breath.  She has had multiple stressful events over the last several weeks.  Her cat died, there was a death in the family and an event with feeling bad from ETOH ingestion where afterwards she ended up in the hospital with elevated HR.  She has tried Lorazepam and it helps with the heart rate sometimes but it makes her to sleepy to use it during the day.  The Xanax does not seem to help but does make her feel funny.  She is really worried whether her heart is going to be damaged from her elevated HR.    She has not taken her KCl over the last 5 days except for today.  Review of Systems     Objective:   Physical Exam  Vitals reviewed. Constitutional: She appears well-developed and well-nourished.  HENT:  Head: Normocephalic and atraumatic.  Right Ear: External ear normal.  Left Ear: External ear normal.  Cardiovascular: Normal rate, regular rhythm and normal heart sounds.   No murmur heard. Pulmonary/Chest: Effort normal.  Skin: Skin is warm and dry.  Psychiatric: Her behavior is normal. Judgment and thought content normal. Her mood appears anxious.  Tearful       Assessment & Plan:  Hypokalemia - Plan: Basic metabolic panel  Palpitations - she has an event monitor ordered and I d/w pt that she should hear about that appt in the next several days and if she does not she should contact our office.  I spent a lot of time counseling patient in regards to elevated HR.  I wonder if some of her symptoms are related to her decreased BP because her eating has been decreased due to her stress  and maybe she gets lightheaded and then she gets an elevated HR (which her resting HR has been running in the mid 60s) which then creates panic symptoms or further increase in HR elevated and SOB.  We discussed when her HR is in high normal it may feel really fast to her and the event monitor should allow [place] to see how fast her HR is during her events.  She is going to take her BP daily to see if it to low enough to possible be causing some of her symptoms and we talked about trying to check her BP when she is having symptoms.  We also discussed with her recent increase stress that anxiety is the most likely cause of her symptoms but she needs the reassurance which is understandable.  She will recheck before her cardio appt if her symptoms worsen or change.  Tachycardia  Windell Hummingbird PA-C  [facility] 09/02/2014 8:53 PM

## 2014-09-02 NOTE — Telephone Encounter (Signed)
Please call [facility] out[facility] to see if we can have a cardiac event monitor placed on pt today or tomorrow. She has appt with [facility] on 11/13 but pt needs more immed eval for her episodic tachycardia/palpiatations and blurred vision.

## 2014-09-02 NOTE — Telephone Encounter (Signed)
Called [facility] to get information on ordering event monitor. Holter monitor, [employer]-2 week/30 days. We need to fax order to [phone] What type of event monitor do you want to order?

## 2014-09-02 NOTE — Patient Instructions (Signed)
We are getting you scheduling for the holter monitor. Monitor your BP daily to make sure it is not going to low (under 110/70 you may feel lightheaded and we need to decrease your BP medications)

## 2014-09-02 NOTE — Telephone Encounter (Signed)
Pt is very concerned about her job and taking time off for Dr. Kendrick Fries. She is going to get the FMLA paperwork to bring in today after work (5:30) and will be returning to clinic for evaluation. Advised her Dr. Brigitte Pulse was no longer seeing patients today after 530 and pt states she will see anyone to get this taken care of. Her palpitations have been occuring more frequently and more intense.

## 2014-09-02 NOTE — Addendum Note (Signed)
Addended by: Jethro Bolus A on: 09/02/2014 03:21 PM   Modules accepted: Orders

## 2014-09-02 NOTE — Telephone Encounter (Signed)
Phone call from answerign service, pt had low BP. spoke with pt she has recurrent palpitations, some CP . She is nondescript about it, anxious, worried about her low BP when she called her BP was 96/60,  HR was  74 then again it was 127/86. She was asking me if her heart was going to stop beating if she sleeps. Precautions given to go to ED. Paramters given to not t ake BP meds int he AM if BP lower than 130/80. She is push water.

## 2014-09-02 NOTE — Telephone Encounter (Signed)
Pt needs an event monitor (not holter) for 2 weeks.

## 2014-09-02 NOTE — Telephone Encounter (Signed)
Cardiac monitor has been ordered.

## 2014-09-03 ENCOUNTER — Telehealth: Payer: Self-pay

## 2014-09-03 DIAGNOSIS — R000 Tachycardia, unspecified: Secondary | ICD-10-CM

## 2014-09-03 DIAGNOSIS — R002 Palpitations: Secondary | ICD-10-CM

## 2014-09-03 LAB — BASIC METABOLIC PANEL
BUN: 11 mg/dL (ref 6–23)
CO2: 26 mEq/L (ref 19–32)
Calcium: 9.4 mg/dL (ref 8.4–10.5)
Chloride: 102 mEq/L (ref 96–112)
Creat: 0.99 mg/dL (ref 0.50–1.10)
Glucose, Bld: 110 mg/dL — ABNORMAL HIGH (ref 70–99)
Potassium: 3.6 mEq/L (ref 3.5–5.3)
Sodium: 139 mEq/L (ref 135–145)

## 2014-09-03 NOTE — Telephone Encounter (Signed)
Patient has been feeling dizzy/light headed, BP has been low 98/62 and she has had increase in her anxiety. Patient has been taking "Lisinipril" and feels this may be side effects from it. Patient wants to know if it is ok for her stop taking this medication to see if it is from it or if she needs to come in? Patient wants to know how low is too low for her Blood Pressure to be that she would need to seek care. Patients call back [phone]-patient states if she does not answer ok to leave a detail message.

## 2014-09-03 NOTE — Telephone Encounter (Signed)
Pt came into office for a BP check. I got 140/80 and her BP machine got 142/90. We looked through her history on her BP machine and it had been running between 95-126/70-96 all day. She said she had a panic attack on the way here. Wants to stop her lisinopril because of her low BP's all day but advised her not too since it was high here. She's scared to take the lorazepam because it drops her BP. Told her it's better to be a little low then a little high. Pt not currently having any sxs and appears to be fine.   Buck Mam. Please call pt back in the morning.  Thanks

## 2014-09-03 NOTE — Telephone Encounter (Signed)
LM for rtn call. 

## 2014-09-04 NOTE — Telephone Encounter (Signed)
Lm for rtn call.   Late chart entry- LM for rtn call 11am today

## 2014-09-04 NOTE — Telephone Encounter (Signed)
Advise her to check her BP ONCE daily (just before her lisinopril dose is due is the best time).  If the systolic number is less than 110, hold the lisinopril that day.  Follow-up with cardiology as planned.

## 2014-09-04 NOTE — Telephone Encounter (Signed)
Call this patient.  Would not advise stopping Lisinopril based on the numbers she's recently reported.  If consistently below 466 systolic, reduce dose by half.  Lorazepam is only when she's panicked. She could also reduce that dose by half.  If she has any concerns, return.  However, she's to see cardiology soon, so adjustments in the lisinopril dose can be made there.

## 2014-09-04 NOTE — Telephone Encounter (Signed)
Per BP readings today were still low and she did not take a Lisinopril.  98/65 105/75  She only takes a 1/2 tab of lorazepam currently  Wants to know, since she is already taking Atenolol, does she really need to take a 1/2 tab of lisinopril. I told her yes per your note, but please advise. Thanks

## 2014-09-05 ENCOUNTER — Telehealth: Payer: Self-pay | Admitting: Cardiovascular Disease

## 2014-09-05 ENCOUNTER — Encounter: Payer: Self-pay | Admitting: Radiology

## 2014-09-05 NOTE — Telephone Encounter (Signed)
LM for pt- hold lisinopril rtn call.

## 2014-09-05 NOTE — Telephone Encounter (Signed)
Read other notes, fine to stop lisinopril. Event monitor was ordered 3d ago but I don't see that this has been processed - just wanted to make sure that it didn't go into a work queu somewhere that no-one checks on (which is what happens when we order echos).  Please f/u and ensure that placement of the monitor has been arranged/sched

## 2014-09-05 NOTE — Telephone Encounter (Signed)
Closed encounter °

## 2014-09-05 NOTE — Telephone Encounter (Signed)
Pt called back to follow up, I read her the previous message. She said that her systolic has been running right around 88, and she has felt very light headed. She said that she would hold off on taking the lisinopril until she sees her cardiologist.

## 2014-09-06 NOTE — Telephone Encounter (Signed)
See other phone message  

## 2014-09-06 NOTE — Telephone Encounter (Signed)
Pt advised to stop Lisinopril. She said she stopped Lisinopril for 2 days and her systolic was in the 53'U so she started taking a 1/2 tab again. Advised her that 52's was ok and that she could stop it.   She called to ask about her labs that were drawn in the hospital. Advised her that her K+ was low and her glucose was high, but that when we checked it 2 days later, it was normal.   Let her know that we would call her on Monday to let her know the status of the event monitor.

## 2014-09-07 ENCOUNTER — Other Ambulatory Visit: Payer: Self-pay

## 2014-09-07 ENCOUNTER — Other Ambulatory Visit: Payer: Self-pay | Admitting: Emergency Medicine

## 2014-09-07 ENCOUNTER — Emergency Department (HOSPITAL_COMMUNITY)
Admission: EM | Admit: 2014-09-07 | Discharge: 2014-09-07 | Disposition: A | Discharge status: Home / Self Care | Payer: 59 | Attending: Emergency Medicine | Admitting: Emergency Medicine

## 2014-09-07 ENCOUNTER — Emergency Department (HOSPITAL_COMMUNITY): Payer: 59

## 2014-09-07 DIAGNOSIS — Z720 Tobacco use: Secondary | ICD-10-CM | POA: Diagnosis not present

## 2014-09-07 DIAGNOSIS — I10 Essential (primary) hypertension: Secondary | ICD-10-CM | POA: Diagnosis not present

## 2014-09-07 DIAGNOSIS — R002 Palpitations: Secondary | ICD-10-CM

## 2014-09-07 DIAGNOSIS — Z791 Long term (current) use of non-steroidal anti-inflammatories (NSAID): Secondary | ICD-10-CM | POA: Insufficient documentation

## 2014-09-07 DIAGNOSIS — I471 Supraventricular tachycardia: Secondary | ICD-10-CM | POA: Insufficient documentation

## 2014-09-07 DIAGNOSIS — Z8719 Personal history of other diseases of the digestive system: Secondary | ICD-10-CM | POA: Insufficient documentation

## 2014-09-07 DIAGNOSIS — F419 Anxiety disorder, unspecified: Secondary | ICD-10-CM | POA: Insufficient documentation

## 2014-09-07 DIAGNOSIS — Z7951 Long term (current) use of inhaled steroids: Secondary | ICD-10-CM | POA: Diagnosis not present

## 2014-09-07 DIAGNOSIS — R0602 Shortness of breath: Secondary | ICD-10-CM | POA: Diagnosis present

## 2014-09-07 LAB — URINALYSIS, ROUTINE W REFLEX MICROSCOPIC
Bilirubin Urine: NEGATIVE
Glucose, UA: NEGATIVE mg/dL
Ketones, ur: NEGATIVE mg/dL
Leukocytes, UA: NEGATIVE
Nitrite: NEGATIVE
Protein, ur: NEGATIVE mg/dL
Specific Gravity, Urine: 1.02 (ref 1.005–1.030)
Urobilinogen, UA: 0.2 mg/dL (ref 0.0–1.0)
pH: 5.5 (ref 5.0–8.0)

## 2014-09-07 LAB — CBC WITH DIFFERENTIAL/PLATELET
Basophils Absolute: 0 10*3/uL (ref 0.0–0.1)
Basophils Relative: 0 % (ref 0–1)
Eosinophils Absolute: 0.1 10*3/uL (ref 0.0–0.7)
Eosinophils Relative: 1 % (ref 0–5)
HCT: 41.5 % (ref 36.0–46.0)
Hemoglobin: 14.5 g/dL (ref 12.0–15.0)
Lymphocytes Relative: 42 % (ref 12–46)
Lymphs Abs: 5.4 10*3/uL — ABNORMAL HIGH (ref 0.7–4.0)
MCH: 30.7 pg (ref 26.0–34.0)
MCHC: 34.9 g/dL (ref 30.0–36.0)
MCV: 87.7 fL (ref 78.0–100.0)
Monocytes Absolute: 0.9 10*3/uL (ref 0.1–1.0)
Monocytes Relative: 7 % (ref 3–12)
Neutro Abs: 6.4 10*3/uL (ref 1.7–7.7)
Neutrophils Relative %: 50 % (ref 43–77)
Platelets: 297 10*3/uL (ref 150–400)
RBC: 4.73 MIL/uL (ref 3.87–5.11)
RDW: 12.6 % (ref 11.5–15.5)
WBC: 12.8 10*3/uL — ABNORMAL HIGH (ref 4.0–10.5)

## 2014-09-07 LAB — BASIC METABOLIC PANEL
Anion gap: 16 — ABNORMAL HIGH (ref 5–15)
BUN: 15 mg/dL (ref 6–23)
CO2: 24 mEq/L (ref 19–32)
Calcium: 9.9 mg/dL (ref 8.4–10.5)
Chloride: 99 mEq/L (ref 96–112)
Creatinine, Ser: 1.07 mg/dL (ref 0.50–1.10)
GFR calc Af Amer: 75 mL/min — ABNORMAL LOW (ref >90)
GFR calc non Af Amer: 65 mL/min — ABNORMAL LOW (ref >90)
Glucose, Bld: 152 mg/dL — ABNORMAL HIGH (ref 70–99)
Potassium: 3.8 mEq/L (ref 3.7–5.3)
Sodium: 139 mEq/L (ref 137–147)

## 2014-09-07 LAB — URINE MICROSCOPIC-ADD ON

## 2014-09-07 LAB — PREGNANCY, URINE: Preg Test, Ur: NEGATIVE

## 2014-09-07 MED ORDER — SODIUM CHLORIDE 0.9 % IV BOLUS (SEPSIS)
1000.0000 mL | Freq: Once | INTRAVENOUS | Status: AC
  Administered 2014-09-07: 1000 mL via INTRAVENOUS

## 2014-09-07 MED ORDER — LORAZEPAM 2 MG/ML IJ SOLN: 1.0000 mg | Freq: Once | INTRAMUSCULAR | Status: DC

## 2014-09-07 NOTE — ED Notes (Signed)
See downtime forms for documentation prior to 0400

## 2014-09-07 NOTE — ED Provider Notes (Signed)
CSN: [id number]     Arrival date & time 09/07/14  0102 History   None    No chief complaint on file.    (Consider location/radiation/quality/duration/timing/severity/associated sxs/prior Treatment) HPI   38 year old female with history of tachycardia, and anxiety who presents for evaluation of lightheadedness and shortness of breath. Patient states for the past 2 weeks she has had recurrent episodes of heart palpitation, lightheadedness and shortness of breath. Symptoms seemed to be increasing in occurrence and in duration. Symptom especially worsened today while she was sitting and noticing that her heart was beating fast, she felt lightheadedness, having shortness of breath, with chest pressure and sensation of near syncope. These symptoms made her very anxious. She has been seen and evaluate for this complaint recently both in the ER, and also by her PCP. Patient states she is scheduled to follow-up with cardiology for a Holter monitoring. However given her specific symptoms today, she decided to come to ER for further care. Otherwise patient denies any fever, chills, severe headache, neck pain, productive cough, abdominal pain, nausea vomiting diarrhea, recent alcohol or street drug use, or taking any stimulant. No recent medication changes.  no significant history of heart disease  Past Medical History  Diagnosis Date  . Hypertension   . GERD (gastroesophageal reflux disease)   . Tachycardia    No past surgical history on file. Family History  Problem Relation Age of Onset  . Seizures Mother   . Cancer Father     Liver  . Diabetes Father 38  . Hypertension Father   . Heart disease Father     CEA, LE Stenting  . Alzheimer's disease Maternal Grandmother   . Heart disease Maternal Grandfather    History  Substance Use Topics  . Smoking status: Current Some Day Smoker    Types: Cigarettes    Last Attempt to Quit: 08/07/2012  . Smokeless tobacco: Never Used     Comment: Maybe six  cigarerres a week  . Alcohol Use: Yes     Comment: occassionally   OB History    Gravida Para Term Preterm AB TAB SAB Ectopic Multiple Living   2 0 0 0 2 1 1 0 0 0      Review of Systems  All other systems reviewed and are negative.     Allergies  Penicillins and Tamiflu  Home Medications   Prior to Admission medications   Medication Sig Start Date End Date Taking? Authorizing Provider  albuterol (PROVENTIL HFA;VENTOLIN HFA) 108 (90 BASE) MCG/ACT inhaler Inhale 2 puffs into the lungs every 6 (six) hours as needed for wheezing or shortness of breath.    Historical Provider, MD  ALPRAZolam (NIRAVAM) 0.5 MG dissolvable tablet Take 1-2 tablets (0.5-1 mg total) by mouth 3 (three) times daily as needed (panic attack). 08/26/14   [name], MD  atenolol-chlorthalidone (TENORETIC) 50-25 MG per tablet Take 0.5 tablets by mouth daily.    Historical Provider, MD  cyclobenzaprine (FLEXERIL) 10 MG tablet Take 10 mg by mouth daily as needed for muscle spasms.    Historical Provider, MD  fluticasone (FLONASE) 50 MCG/ACT nasal spray Place 1 spray into both nostrils daily as needed for allergies or rhinitis.    Historical Provider, MD  lisinopril (PRINIVIL,ZESTRIL) 10 MG tablet Take 1 tablet (10 mg total) by mouth daily. PATIENT NEEDS OFFICE VISIT FOR ADDITIONAL REFILLS - 2nd NOTICE 03/17/14   [name], MD  LORazepam (ATIVAN) 0.5 MG tablet Take 0.25-0.5 mg by mouth every 8 (eight)  hours.    Historical Provider, MD  meloxicam (MOBIC) 15 MG tablet Take 1 tablet (15 mg total) by mouth daily. 08/04/14   [name], MD  Multiple Vitamins-Minerals (HAIR/SKIN/NAILS PO) Take 2 tablets by mouth daily.    Historical Provider, MD  potassium chloride (K-DUR) 10 MEQ tablet Take 1 tablet (10 mEq total) by mouth daily. 08/24/14   Hoy Morn, MD   LMP 08/13/2014 Physical Exam  Constitutional: She is oriented to person, place, and time. She appears well-developed and well-nourished. She appears distressed  (patient appears anxious).  HENT:  Head: Atraumatic.  Mouth/Throat: Oropharynx is clear and moist.  Eyes: Conjunctivae are normal.  Neck: Neck supple.  Cardiovascular: Exam reveals no gallop and no friction rub.   No murmur heard. Tachycardia without murmurs rubs or gallops  Pulmonary/Chest:  Mildly tachypneic with tachycardia  Abdominal: Soft. Bowel sounds are normal. She exhibits no distension. There is no tenderness.  Neurological: She is alert and oriented to person, place, and time.  Skin: No rash noted.  Psychiatric: She has a normal mood and affect.  Nursing note and vitals reviewed.   ED Course  Procedures (including critical care time)  Patient with recurrent episodes of tachycardia likely SVT versus anxiety. Her heart rate has increased to as high as 139 but has improved after receiving IV fluid and Ativan. She is currently resting comfortably. She is currently on atenolol.  5:08 AM Patient's labs were reassuring. Mild leukocytosis of 12.8 however her WBC has been elevated for the past several visits. No specific complaint to suggest infectious etiology. Pregnancy test is negative. Patient is currently taking atenolol-chlorthalidone. Plan to discuss with cardiology for further management. Care discussed with Dr. Regenia Skeeter.    5:45 AM Heart rate has returned back to baseline. Blood pressure with systolic of 96. Upon reviewing prior charts, patient has had soft blood pressure documented several times in the past. Her EKG shows sinus tach and no evidence of acute ischemic changes, trop negative, and her chest x-ray is unremarkable. Patient now felt back to her normal baseline. I have consult with on-call cardiologist who recommend patient to call office for close follow-up. Patient initially had cardiology appointment on November 11. She will call office to see if she can follow-up sooner. Her phone number is [phone].  She will contact Dr. Kennon Holter office tomorrow.  Return  precaution discussed.  Pt to continue with her current medication.    Labs Review Labs Reviewed - No data to display  Imaging Review Dg Chest Port 1 View  09/07/2014   CLINICAL DATA:  Initial evaluation for shortness of breath, chest pressure. History of hypertension.  EXAM: PORTABLE CHEST - 1 VIEW  COMPARISON:  Prior radiograph from 08/17/2012  FINDINGS: The cardiac and mediastinal silhouettes are stable in size and contour, and remain within normal limits.  The lungs are normally inflated. No airspace consolidation, pleural effusion, or pulmonary edema is identified. There is no pneumothorax.  No acute osseous abnormality identified.  IMPRESSION: No active cardiopulmonary disease.   Electronically Signed   By: Jeannine Boga M.D.   On: 09/07/2014 05:28     EKG Interpretation None      Date: 09/07/2014  Rate: 130  Rhythm: sinus tachycardia  QRS Axis: normal  Intervals: normal  ST/T Wave abnormalities: indeterminate  Conduction Disutrbances:none  Narrative Interpretation:   Old EKG Reviewed: changes noted    MDM   Final diagnoses:  Heart palpitations  Atrial tachycardia, paroxysmal    BP 96/37  mmHg  Pulse 81  Resp 21  SpO2 96%  LMP 08/13/2014  I have reviewed nursing notes and vital signs. I personally reviewed the imaging tests through PACS system  I reviewed available ER/hospitalization records thought the EMR     Domenic Moras, [place] 09/07/14 [id number]

## 2014-09-07 NOTE — Discharge Instructions (Signed)
Please call and follow up closely with your cardiologist for further evaluation and management of your fast heart rate.  Continue with your current medication . Return if your condition worsen or if you have other concerns.   Supraventricular Tachycardia Supraventricular tachycardia (SVT) is when the heart beats very fast. SVT can last for a long time (sustained) or it can start and stop suddenly (nonsustained). HOME CARE   Take your heart medicine as told by your doctor. Check with your doctor before taking cold, diet, or herbal medicine.  Do not smoke.  Do not drink large amounts of caffeine.Caffeine is found in coffee, tea, soda (pop, cola), and chocolate.  Keep all doctor visits as told. GET HELP RIGHT AWAY IF:   You have chest pain or pressure.  You cannot catch your breath.  You are dizzy or lightheaded.  You feel like you will pass out (faint).  You are sweaty (diaphoretic) and feel sick to your stomach (nauseous) or throw up (vomit).  If you have the above problems, call your local emergency services (911 in [place] right away. Do not drive yourself to the hospital. MAKE SURE YOU:   Understand these instructions.  Will watch your condition.  Will get help right away if you are not doing well or get worse. Document Released: 10/24/2005 Document Revised: 01/16/2012 Document Reviewed: 01/28/2009 [facility] Patient Information 2015 [place], [place]. This information is not intended to replace advice given to you by your health care provider. Make sure you discuss any questions you have with your health care provider.

## 2014-09-08 ENCOUNTER — Telehealth: Payer: Self-pay

## 2014-09-08 LAB — I-STAT TROPONIN, ED: Troponin i, poc: 0 ng/mL (ref 0.00–0.08)

## 2014-09-08 NOTE — Telephone Encounter (Signed)
Patient is wondering if it is a  Good idea  to take her LORazepam (ATIVAN) 0.5 MG tablet [[id number]] with her BP being as low as 99/60. Please advise at [phone]

## 2014-09-08 NOTE — Telephone Encounter (Signed)
Notified pt per Chelle that she does not have to take the lorazepam is she does not want to but it is ok to take her regular dose.  Advised if she has any issues to call back, come in, or go to ER.

## 2014-09-09 ENCOUNTER — Telehealth: Payer: Self-pay

## 2014-09-09 ENCOUNTER — Emergency Department (HOSPITAL_COMMUNITY)
Admission: EM | Admit: 2014-09-09 | Discharge: 2014-09-09 | Disposition: A | Discharge status: Home / Self Care | Payer: 59 | Attending: Emergency Medicine | Admitting: Emergency Medicine

## 2014-09-09 ENCOUNTER — Ambulatory Visit (INDEPENDENT_AMBULATORY_CARE_PROVIDER_SITE_OTHER): Payer: 59 | Admitting: Physician Assistant

## 2014-09-09 ENCOUNTER — Encounter (HOSPITAL_COMMUNITY): Payer: Self-pay | Admitting: Family Medicine

## 2014-09-09 ENCOUNTER — Emergency Department (HOSPITAL_COMMUNITY): Payer: 59

## 2014-09-09 VITALS — BP 110/74 | HR 75 | Temp 98.5°F | Resp 18 | Ht 62.75 in | Wt 215.0 lb

## 2014-09-09 DIAGNOSIS — R6883 Chills (without fever): Secondary | ICD-10-CM | POA: Insufficient documentation

## 2014-09-09 DIAGNOSIS — R51 Headache: Secondary | ICD-10-CM | POA: Insufficient documentation

## 2014-09-09 DIAGNOSIS — Z8719 Personal history of other diseases of the digestive system: Secondary | ICD-10-CM | POA: Insufficient documentation

## 2014-09-09 DIAGNOSIS — I10 Essential (primary) hypertension: Secondary | ICD-10-CM | POA: Diagnosis not present

## 2014-09-09 DIAGNOSIS — Z8679 Personal history of other diseases of the circulatory system: Secondary | ICD-10-CM | POA: Diagnosis not present

## 2014-09-09 DIAGNOSIS — Z720 Tobacco use: Secondary | ICD-10-CM | POA: Insufficient documentation

## 2014-09-09 DIAGNOSIS — R000 Tachycardia, unspecified: Secondary | ICD-10-CM

## 2014-09-09 DIAGNOSIS — F410 Panic disorder [episodic paroxysmal anxiety] without agoraphobia: Secondary | ICD-10-CM

## 2014-09-09 DIAGNOSIS — Z79899 Other long term (current) drug therapy: Secondary | ICD-10-CM | POA: Insufficient documentation

## 2014-09-09 DIAGNOSIS — R42 Dizziness and giddiness: Secondary | ICD-10-CM | POA: Diagnosis not present

## 2014-09-09 DIAGNOSIS — Z3202 Encounter for pregnancy test, result negative: Secondary | ICD-10-CM | POA: Diagnosis not present

## 2014-09-09 DIAGNOSIS — R55 Syncope and collapse: Secondary | ICD-10-CM | POA: Diagnosis not present

## 2014-09-09 DIAGNOSIS — R531 Weakness: Secondary | ICD-10-CM | POA: Insufficient documentation

## 2014-09-09 DIAGNOSIS — R079 Chest pain, unspecified: Secondary | ICD-10-CM | POA: Diagnosis not present

## 2014-09-09 DIAGNOSIS — R251 Tremor, unspecified: Secondary | ICD-10-CM | POA: Insufficient documentation

## 2014-09-09 DIAGNOSIS — Z7951 Long term (current) use of inhaled steroids: Secondary | ICD-10-CM | POA: Insufficient documentation

## 2014-09-09 DIAGNOSIS — R002 Palpitations: Secondary | ICD-10-CM | POA: Diagnosis not present

## 2014-09-09 DIAGNOSIS — Z791 Long term (current) use of non-steroidal anti-inflammatories (NSAID): Secondary | ICD-10-CM | POA: Insufficient documentation

## 2014-09-09 DIAGNOSIS — Z880 Allergy status to penicillin: Secondary | ICD-10-CM | POA: Insufficient documentation

## 2014-09-09 DIAGNOSIS — R519 Headache, unspecified: Secondary | ICD-10-CM

## 2014-09-09 LAB — BASIC METABOLIC PANEL
Anion gap: 15 (ref 5–15)
BUN: 13 mg/dL (ref 6–23)
CO2: 24 mEq/L (ref 19–32)
Calcium: 10 mg/dL (ref 8.4–10.5)
Chloride: 100 mEq/L (ref 96–112)
Creatinine, Ser: 0.95 mg/dL (ref 0.50–1.10)
GFR calc Af Amer: 86 mL/min — ABNORMAL LOW (ref >90)
GFR calc non Af Amer: 74 mL/min — ABNORMAL LOW (ref >90)
Glucose, Bld: 146 mg/dL — ABNORMAL HIGH (ref 70–99)
Potassium: 3.6 mEq/L — ABNORMAL LOW (ref 3.7–5.3)
Sodium: 139 mEq/L (ref 137–147)

## 2014-09-09 LAB — CBC
HCT: 39.3 % (ref 36.0–46.0)
Hemoglobin: 13.5 g/dL (ref 12.0–15.0)
MCH: 29.8 pg (ref 26.0–34.0)
MCHC: 34.4 g/dL (ref 30.0–36.0)
MCV: 86.8 fL (ref 78.0–100.0)
Platelets: 291 10*3/uL (ref 150–400)
RBC: 4.53 MIL/uL (ref 3.87–5.11)
RDW: 12.5 % (ref 11.5–15.5)
WBC: 11.5 10*3/uL — ABNORMAL HIGH (ref 4.0–10.5)

## 2014-09-09 LAB — I-STAT TROPONIN, ED: Troponin i, poc: 0 ng/mL (ref 0.00–0.08)

## 2014-09-09 LAB — POC URINE PREG, ED: Preg Test, Ur: NEGATIVE

## 2014-09-09 MED ORDER — ATENOLOL 50 MG PO TABS: 50.0000 mg | ORAL_TABLET | Freq: Every day | ORAL | Status: DC

## 2014-09-09 MED ORDER — ASPIRIN 325 MG PO TABS
325.0000 mg | ORAL_TABLET | ORAL | Status: DC
  Filled 2014-09-09: qty 1

## 2014-09-09 MED ORDER — MECLIZINE HCL 25 MG PO TABS: 25.0000 mg | ORAL_TABLET | Freq: Three times a day (TID) | ORAL | Status: DC | PRN

## 2014-09-09 MED ORDER — CLONAZEPAM 0.5 MG PO TABS: 0.2500 mg | ORAL_TABLET | Freq: Two times a day (BID) | ORAL | Status: DC | PRN

## 2014-09-09 NOTE — ED Notes (Signed)
Pt has been placed on cardiac monitoring.

## 2014-09-09 NOTE — Telephone Encounter (Signed)
Patient want to be prescribed an anxiety medication that does not cause drowsiness.

## 2014-09-09 NOTE — ED Provider Notes (Signed)
CSN: [id number]     Arrival date & time 09/09/14  0015 History   First MD Initiated Contact with Patient 09/09/14 0233     Chief Complaint  Patient presents with  . Near Syncope  . Extremity Weakness  . Chest Pain     (Consider location/radiation/quality/duration/timing/severity/associated sxs/prior Treatment) HPI Comments: Pt comes in with cc of dizziness and weakness. Pt has hx of tachycardia, HTN. She reports that she has been having palpitations and dizziness all day, but at night, her symptoms got more intense. In addition, pt started having chills and felt like her legs, both of them got really heavy - and as a result, she had hard time moving. She has no chest pains. She does have headaches, and reports that headaches are worse at night. There is fam hx of brain tumors- mother did. She has dizziness, described as lightheadedness. Mostly her symptoms are related to the HR, but she is unsure. There is no hx of DVT, PE and no risk factors for the same.   Patient is a 38 y.o. female presenting with near-syncope, extremity weakness, and chest pain. The history is provided by the patient.  Near Syncope Associated symptoms include chest pain and headaches. Pertinent negatives include no abdominal pain and no shortness of breath.  Extremity Weakness Associated symptoms include chest pain and headaches. Pertinent negatives include no abdominal pain and no shortness of breath.  Chest Pain Associated symptoms: dizziness, headache, near-syncope and palpitations   Associated symptoms: no abdominal pain, no diaphoresis, no nausea, no shortness of breath, not vomiting and no weakness     Past Medical History  Diagnosis Date  . Hypertension   . GERD (gastroesophageal reflux disease)   . Tachycardia    History reviewed. No pertinent past surgical history. Family History  Problem Relation Age of Onset  . Seizures Mother   . Cancer Father     Liver  . Diabetes Father 71  . Hypertension  Father   . Heart disease Father     CEA, LE Stenting  . Alzheimer's disease Maternal Grandmother   . Heart disease Maternal Grandfather    History  Substance Use Topics  . Smoking status: Current Some Day Smoker    Types: Cigarettes    Last Attempt to Quit: 08/07/2012  . Smokeless tobacco: Never Used     Comment: Maybe six cigarerres a week  . Alcohol Use: Yes     Comment: Usually 1 time a month   OB History    Gravida Para Term Preterm AB TAB SAB Ectopic Multiple Living   2 0 0 0 2 1 1 0 0 0      Review of Systems  Constitutional: Positive for chills and activity change. Negative for diaphoresis.  Eyes: Negative for visual disturbance.  Respiratory: Negative for shortness of breath.   Cardiovascular: Positive for chest pain, palpitations and near-syncope.  Gastrointestinal: Negative for nausea, vomiting and abdominal pain.  Genitourinary: Negative for dysuria.  Musculoskeletal: Positive for extremity weakness. Negative for arthralgias and neck pain.  Skin: Negative for rash.  Allergic/Immunologic: Negative for immunocompromised state.  Neurological: Positive for dizziness, tremors, light-headedness and headaches. Negative for syncope and weakness.      Allergies  Penicillins and Tamiflu  Home Medications   Prior to Admission medications   Medication Sig Start Date End Date Taking? Authorizing Provider  acetaminophen (TYLENOL) 500 MG tablet Take 1,000 mg by mouth every 6 (six) hours as needed for mild pain.   Yes Historical Provider, MD  albuterol (PROVENTIL HFA;VENTOLIN HFA) 108 (90 BASE) MCG/ACT inhaler Inhale 2 puffs into the lungs every 6 (six) hours as needed for wheezing or shortness of breath.   Yes Historical Provider, MD  atenolol-chlorthalidone (TENORETIC) 50-25 MG per tablet Take 0.5 tablets by mouth daily.   Yes Historical Provider, MD  cyclobenzaprine (FLEXERIL) 10 MG tablet Take 10 mg by mouth daily as needed for muscle spasms.   Yes Historical Provider, MD   fluticasone (FLONASE) 50 MCG/ACT nasal spray Place 1 spray into both nostrils daily as needed for allergies or rhinitis.   Yes Historical Provider, MD  LORazepam (ATIVAN) 0.5 MG tablet Take 0.25 mg by mouth every 8 (eight) hours as needed for anxiety or sleep.    Yes Historical Provider, MD  Multiple Vitamins-Minerals (HAIR/SKIN/NAILS PO) Take 2 tablets by mouth daily.   Yes Historical Provider, MD  Vitamin D, Ergocalciferol, (DRISDOL) 50000 UNITS CAPS capsule Take 50,000 Units by mouth every 7 (seven) days. Monday 07/30/14  Yes Historical Provider, MD  ALPRAZolam (NIRAVAM) 0.5 MG dissolvable tablet Take 1-2 tablets (0.5-1 mg total) by mouth 3 (three) times daily as needed (panic attack). 08/26/14   [name], MD  lisinopril (PRINIVIL,ZESTRIL) 10 MG tablet Take 1 tablet (10 mg total) by mouth daily. PATIENT NEEDS OFFICE VISIT FOR ADDITIONAL REFILLS - 2nd NOTICE Patient taking differently: Take 5 mg by mouth daily as needed (blood pressure). PATIENT NEEDS OFFICE VISIT FOR ADDITIONAL REFILLS - 2nd NOTICE 03/17/14   [name], MD  meloxicam (MOBIC) 15 MG tablet Take 1 tablet (15 mg total) by mouth daily. 08/04/14   [name], MD  potassium chloride (K-DUR) 10 MEQ tablet Take 1 tablet (10 mEq total) by mouth daily. 08/24/14   Hoy Morn, MD   BP 115/63 mmHg  Pulse 69  Temp(Src) 98.2 F (36.8 C) (Oral)  Resp 20  SpO2 100%  LMP 08/13/2014 Physical Exam  Constitutional: She is oriented to person, place, and time. She appears well-developed and well-nourished.  HENT:  Head: Normocephalic and atraumatic.  Eyes: EOM are normal. Pupils are equal, round, and reactive to light.  Neck: Neck supple.  Cardiovascular: Normal rate, regular rhythm, normal heart sounds and intact distal pulses.   No murmur heard. Pulmonary/Chest: Effort normal. No respiratory distress.  Abdominal: Soft. She exhibits no distension. There is no tenderness. There is no rebound and no guarding.  Neurological: She is alert  and oriented to person, place, and time. No cranial nerve deficit. Coordination normal.  Skin: Skin is warm and dry.  Nursing note and vitals reviewed.   ED Course  Procedures (including critical care time) Labs Review Labs Reviewed  BASIC METABOLIC PANEL - Abnormal; Notable for the following:    Potassium 3.6 (*)    Glucose, Bld 146 (*)    GFR calc non Af Amer 74 (*)    GFR calc Af Amer 86 (*)    All other components within normal limits  CBC - Abnormal; Notable for the following:    WBC 11.5 (*)    All other components within normal limits  I-STAT TROPOININ, ED  POC URINE PREG, ED    Imaging Review Ct Head Wo Contrast  09/09/2014   CLINICAL DATA:  Headache with pain and pressure to the posterior head, off and on for 2 weeks. No injury.  EXAM: CT HEAD WITHOUT CONTRAST  TECHNIQUE: Contiguous axial images were obtained from the base of the skull through the vertex without intravenous contrast.  COMPARISON:  10/07/2008  FINDINGS: Ventricles and sulci appear symmetrical. No mass effect or midline shift. No abnormal extra-axial fluid collections. Gray-white matter junctions are distinct. Basal cisterns are not effaced. No evidence of acute intracranial hemorrhage. No depressed skull fractures. Visualized paranasal sinuses and mastoid air cells are not opacified.  IMPRESSION: No acute intracranial abnormalities.   Electronically Signed   By: Lucienne Capers M.D.   On: 09/09/2014 04:04   Dg Chest Port 1 View  09/09/2014   CLINICAL DATA:  Near syncope. Weakness. Extremity weakness. Chest pain for 48 hr.  EXAM: PORTABLE CHEST - 1 VIEW  COMPARISON:  09/07/2014  FINDINGS: The heart size and mediastinal contours are within normal limits. Both lungs are clear. The visualized skeletal structures are unremarkable.  IMPRESSION: No active disease.   Electronically Signed   By: Lucienne Capers M.D.   On: 09/09/2014 01:46     EKG Interpretation None      Date: 09/09/2014  Rate: 72  Rhythm: normal  sinus rhythm  QRS Axis: normal  Intervals: normal  ST/T Wave abnormalities: normal  Conduction Disutrbances: none  Narrative Interpretation: unremarkable      MDM   Final diagnoses:  Headache  Dizziness    Pt comes in with non specific complains. She is a healthy woman with tachycardia and may be anxiety. Her exam has no focal neuro findings, and cardiovascular exam is normal as well. CT head ordered - as the headaches worse at night, and there is hx of tumors in family. She also has dizziness, and leg heaviness -so with neg CT head, mass has been ruled out.  Pt's dizziness and palpitations are being worked up by the PCP, there is a Cards appt next week, which i informed pt is a helpful appointment for her, as she on occasion feels like she might pass out. With no hx of CHF, and normal Cardiac exam, no further ER workup needed, nor is admission indicated. Pt has had normal tele monitoring in the ER.  Varney Biles, MD 09/09/14 [phone]

## 2014-09-09 NOTE — Discharge Instructions (Signed)
We saw you in the ER for the dizziness, headaches, leg heaviness.  All the results in the ER are normal, labs and imaging. We are not sure what is causing your symptoms. The workup in the ER is not complete, and is limited to screening for life threatening and emergent conditions only, so please see a primary care doctor for further evaluation.   Dizziness Dizziness is a common problem. It is a feeling of unsteadiness or light-headedness. You may feel like you are about to faint. Dizziness can lead to injury if you stumble or fall. A person of any age group can suffer from dizziness, but dizziness is more common in older adults. CAUSES  Dizziness can be caused by many different things, including:  Middle ear problems.  Standing for too long.  Infections.  An allergic reaction.  Aging.  An emotional response to something, such as the sight of blood.  Side effects of medicines.  Tiredness.  Problems with circulation or blood pressure.  Excessive use of alcohol or medicines, or illegal drug use.  Breathing too fast (hyperventilation).  An irregular heart rhythm (arrhythmia).  A low red blood cell count (anemia).  Pregnancy.  Vomiting, diarrhea, fever, or other illnesses that cause body fluid loss (dehydration).  Diseases or conditions such as Parkinson's disease, high blood pressure (hypertension), diabetes, and thyroid problems.  Exposure to extreme heat. DIAGNOSIS  Your health care provider will ask about your symptoms, perform a physical exam, and perform an electrocardiogram (ECG) to record the electrical activity of your heart. Your health care provider may also perform other heart or blood tests to determine the cause of your dizziness. These may include:  Transthoracic echocardiogram (TTE). During echocardiography, sound waves are used to evaluate how blood flows through your heart.  Transesophageal echocardiogram (TEE).  Cardiac monitoring. This allows your  health care provider to monitor your heart rate and rhythm in real time.  Holter monitor. This is a portable device that records your heartbeat and can help diagnose heart arrhythmias. It allows your health care provider to track your heart activity for several days if needed.  Stress tests by exercise or by giving medicine that makes the heart beat faster. TREATMENT  Treatment of dizziness depends on the cause of your symptoms and can vary greatly. HOME CARE INSTRUCTIONS   Drink enough fluids to keep your urine clear or pale yellow. This is especially important in very hot weather. In older adults, it is also important in cold weather.  Take your medicine exactly as directed if your dizziness is caused by medicines. When taking blood pressure medicines, it is especially important to get up slowly.  Rise slowly from chairs and steady yourself until you feel okay.  In the morning, first sit up on the side of the bed. When you feel okay, stand slowly while holding onto something until you know your balance is fine.  Move your legs often if you need to stand in one place for a long time. Tighten and relax your muscles in your legs while standing.  Have someone stay with you for 1-2 days if dizziness continues to be a problem. Do this until you feel you are well enough to stay alone. Have the person call your health care provider if he or she notices changes in you that are concerning.  Do not drive or use heavy machinery if you feel dizzy.  Do not drink alcohol. SEEK IMMEDIATE MEDICAL CARE IF:   Your dizziness or light-headedness gets worse.  You feel nauseous or vomit.  You have problems talking, walking, or using your arms, hands, or legs.  You feel weak.  You are not thinking clearly or you have trouble forming sentences. It may take a friend or family member to notice this.  You have chest pain, abdominal pain, shortness of breath, or sweating.  Your vision changes.  You  notice any bleeding.  You have side effects from medicine that seems to be getting worse rather than better. MAKE SURE YOU:   Understand these instructions.  Will watch your condition.  Will get help right away if you are not doing well or get worse. Document Released: 04/19/2001 Document Revised: 10/29/2013 Document Reviewed: 05/13/2011 [facility] Patient Information 2015 [place], [place]. This information is not intended to replace advice given to you by your health care provider. Make sure you discuss any questions you have with your health care provider. Near-Syncope Near-syncope (commonly known as near fainting) is sudden weakness, dizziness, or feeling like you might pass out. During an episode of near-syncope, you may also develop pale skin, have tunnel vision, or feel sick to your stomach (nauseous). Near-syncope may occur when getting up after sitting or while standing for a long time. It is caused by a sudden decrease in blood flow to the brain. This decrease can result from various causes or triggers, most of which are not serious. However, because near-syncope can sometimes be a sign of something serious, a medical evaluation is required. The specific cause is often not determined. HOME CARE INSTRUCTIONS  Monitor your condition for any changes. The following actions may help to alleviate any discomfort you are experiencing:  Have someone stay with you until you feel stable.  Lie down right away and prop your feet up if you start feeling like you might faint. Breathe deeply and steadily. Wait until all the symptoms have passed. Most of these episodes last only a few minutes. You may feel tired for several hours.   Drink enough fluids to keep your urine clear or pale yellow.   If you are taking blood pressure or heart medicine, get up slowly when seated or lying down. Take several minutes to sit and then stand. This can reduce dizziness.  Follow up with your health care provider as  directed. SEEK IMMEDIATE MEDICAL CARE IF:   You have a severe headache.   You have unusual pain in the chest, abdomen, or back.   You are bleeding from the mouth or rectum, or you have black or tarry stool.   You have an irregular or very fast heartbeat.   You have repeated fainting or have seizure-like jerking during an episode.   You faint when sitting or lying down.   You have confusion.   You have difficulty walking.   You have severe weakness.   You have vision problems.  MAKE SURE YOU:   Understand these instructions.  Will watch your condition.  Will get help right away if you are not doing well or get worse. Document Released: 10/24/2005 Document Revised: 10/29/2013 Document Reviewed: 03/29/2013 [facility] Patient Information 2015 [place], [place]. This information is not intended to replace advice given to you by your health care provider. Make sure you discuss any questions you have with your health care provider.

## 2014-09-09 NOTE — Patient Instructions (Addendum)
Antivert 25 - take for dizziness - I would take this for 24h and if the dizziness gets better - continue the medication - if it does not work try the Klonopin which helps anxiety (start with a half pill)  Stop the lisinpril Stop the atenolol/chlorthalidone - we are going to put you just on atenolol for rate control - we are doing this to see if your low blood pressure if giving you some of your symptoms.

## 2014-09-09 NOTE — ED Notes (Signed)
Patient returned from [place]. CT employee provided a warm blanket.

## 2014-09-09 NOTE — ED Notes (Signed)
Acuity noted needed to be changed at 2:00.

## 2014-09-09 NOTE — ED Notes (Signed)
Patient transported to CT 

## 2014-09-10 ENCOUNTER — Telehealth: Payer: Self-pay

## 2014-09-10 ENCOUNTER — Telehealth: Payer: Self-pay | Admitting: Cardiovascular Disease

## 2014-09-10 MED ORDER — PROPRANOLOL HCL 40 MG PO TABS: 40.0000 mg | ORAL_TABLET | Freq: Three times a day (TID) | ORAL | Status: DC | PRN

## 2014-09-10 NOTE — Telephone Encounter (Signed)
The only options for that are long term options that would take several weeks to reach full effect and she would stay on for 6 months - first line is antidepressents or buspirone.  Every prn anxiety medicine has fatigue. The best might be prn -propranolol - a blood pressure medicine that works for anxiety - will send to pharmacy. If she wants to try anthing else, rec f/u in [place].

## 2014-09-10 NOTE — Telephone Encounter (Signed)
Pt has appt next week with dr. Gwenlyn Found Has been seen in er with chest pain and referred here.  She needs to be seen earlier than next week. Feels faint, weak, with chest pain.  Instructed patient to go to er if active chest pain and would check to see if we can get her in earlier.  She says no way she can wait until next week.  States ER keeps referring her back to cardiologist.  EKG was normal in ER.

## 2014-09-10 NOTE — Telephone Encounter (Signed)
Patient called. States she needs to see a heart doctor immediately. States she has an appointment but she can't wait that long. States the only way she can get in with one sooner is for someone from our office to call the cardiologist and express that she needs to be seen immediately. States she cannot keep going to the ER every single day. Please return call and advise. CB # [phone]

## 2014-09-10 NOTE — Telephone Encounter (Signed)
I doubt [facility] could get her in any earlier but try scheduling with Dr. Irven Shelling office - they are normally good about working in pts - hopefully can be seen this week.

## 2014-09-10 NOTE — Telephone Encounter (Signed)
Yes, but they can both cause drowsiness, so she should not take them together if she plans to drive, go to work, etc.

## 2014-09-10 NOTE — Telephone Encounter (Signed)
Patient wants to know if she can take meclizine and lorazepam together. Please return call and advise. CB # [phone]

## 2014-09-10 NOTE — Telephone Encounter (Signed)
[facility] office staff rescheduled the patient to see Dr. Percival Spanish on 09/11/14.

## 2014-09-10 NOTE — Telephone Encounter (Deleted)
A user error has taken place: {error:[id number]}.

## 2014-09-10 NOTE — Telephone Encounter (Signed)
Pt has also called [facility] to get the appt moved up.  Please advise if we can move this up sooner somehow. Pt had a normal EKG in the ED.

## 2014-09-11 ENCOUNTER — Encounter: Payer: Self-pay | Admitting: Cardiology

## 2014-09-11 ENCOUNTER — Encounter: Payer: Self-pay | Admitting: *Deleted

## 2014-09-11 ENCOUNTER — Encounter (INDEPENDENT_AMBULATORY_CARE_PROVIDER_SITE_OTHER): Payer: 59

## 2014-09-11 ENCOUNTER — Encounter: Payer: Self-pay | Admitting: Neurology

## 2014-09-11 ENCOUNTER — Ambulatory Visit (INDEPENDENT_AMBULATORY_CARE_PROVIDER_SITE_OTHER): Payer: 59 | Admitting: Cardiology

## 2014-09-11 ENCOUNTER — Ambulatory Visit (INDEPENDENT_AMBULATORY_CARE_PROVIDER_SITE_OTHER): Payer: 59 | Admitting: Neurology

## 2014-09-11 VITALS — BP 155/78 | HR 74 | Ht 63.5 in | Wt 211.0 lb

## 2014-09-11 VITALS — BP 122/82 | HR 64 | Ht 63.0 in | Wt 212.2 lb

## 2014-09-11 DIAGNOSIS — R072 Precordial pain: Secondary | ICD-10-CM

## 2014-09-11 DIAGNOSIS — R000 Tachycardia, unspecified: Secondary | ICD-10-CM | POA: Insufficient documentation

## 2014-09-11 DIAGNOSIS — R42 Dizziness and giddiness: Secondary | ICD-10-CM

## 2014-09-11 DIAGNOSIS — R0789 Other chest pain: Secondary | ICD-10-CM | POA: Insufficient documentation

## 2014-09-11 DIAGNOSIS — R55 Syncope and collapse: Secondary | ICD-10-CM

## 2014-09-11 NOTE — Telephone Encounter (Signed)
Please verify medication, pt is taking atenolol and propanol was prescribed. Pt has not picked this up from the pharmacy and the Cardiologist advised pt to not take it along with the atenolol.  Was the pt to stop atenolol and start propranolol?   If she is to only take the atenolol- Atenolol half or full tablet?- she has been only taking half tablets afraid her BP/Heart Rate would decline to much. Should she be taking a whole tablet?

## 2014-09-11 NOTE — Patient Instructions (Signed)
Scheduled 21 day event monitor    Your physician recommends that you schedule a follow-up appointment  1 week after event monitor.

## 2014-09-11 NOTE — Telephone Encounter (Signed)
If she saw the cardiologist she should do whatever the cardiologist says.  OK to just stay on the atenolol and not fill the propranolol for now - cancel this rx. If he has any other needs, she should follow up with me in clinic and we can discuss the wide variety of medication options and changes.

## 2014-09-11 NOTE — Progress Notes (Signed)
Patient ID: Brooke Turner, female   DOB: 05/15/1976, 38 y.o.   MRN: [id number] [employer] 21 day cardiac event monitor applied to patient.

## 2014-09-11 NOTE — Telephone Encounter (Signed)
Pt advised.

## 2014-09-11 NOTE — Progress Notes (Signed)
PATIENT: Amos Gaber Felice-Jennings DOB: [birth year]  HISTORICAL  Lise Pincus Felice-Jennings Is a 38 years old right-handed female, referred by her primary care physician Dr. Brigitte Pulse, for evaluation of dizziness, headaches, heart palpitation.  She was previously healthy, in August 24 2014, after loss her family members, and her cat, she felt depressed, she had an alcoholic drink by her friends, shortly after worse, she had a set onset chest racing fast, dizziness, going to faint sensation, called EMS on her way driving back home, was taken to the emergency room,EKG showed sinus tachycardia up to 140, she was treated with Ativan, and IV hydration,potassium was 2.4, she was also given potassium supplement, symptoms has much improved.  Ever since the event,  she continues to  Have similar recurrent intermittent heart racing fast,dizziness lightheaded sensation, as if she is going to faint, initially was short lasting, few minutes, nauseous lasting much longer few hours,  She denies visual change, no lateralized motor or sensory deficit,  She presented to emergency room again in November first, and November third, for similar complaints of chest racing fast,mthere was documented tachycardia, heart rate 1 thirties.  Laboratory showed normal CMP, CBC, TSH,  she has a cardiology appointment  REVIEW OF SYSTEMS: Full 14 system review of systems performed and notable only for chill, weight loss,fatigue, chest pain, palpitation, blurred vision, shortness of breath, wheezing, snoring, diarrhea, easy bruising, easy bleeding, feeling hot, feeling cold, achy muscles, flushing, confusion, headaches, numbness, difficulty swallowing, dizziness, tremor,sleepiness, snoring, restless leg, anxiety, decreased energy, change in appetite  ALLERGIES: Allergies  Allergen Reactions  . Penicillins Hives  . Tamiflu [Oseltamivir Phosphate] Nausea And Vomiting    HOME MEDICATIONS: Current Outpatient Prescriptions on File  Prior to Visit  Medication Sig Dispense Refill  . acetaminophen (TYLENOL) 500 MG tablet Take 1,000 mg by mouth every 6 (six) hours as needed for mild pain.    Marland Kitchen albuterol (PROVENTIL HFA;VENTOLIN HFA) 108 (90 BASE) MCG/ACT inhaler Inhale 2 puffs into the lungs every 6 (six) hours as needed for wheezing or shortness of breath.    . ALPRAZolam (NIRAVAM) 0.5 MG dissolvable tablet Take 1-2 tablets (0.5-1 mg total) by mouth 3 (three) times daily as needed (panic attack). 30 tablet 0  . atenolol (TENORMIN) 50 MG tablet Take 1 tablet (50 mg total) by mouth daily. 30 tablet 0  . clonazePAM (KLONOPIN) 0.5 MG tablet Take 0.5-1 tablets (0.25-0.5 mg total) by mouth 2 (two) times daily as needed for anxiety. 20 tablet 0  . cyclobenzaprine (FLEXERIL) 10 MG tablet Take 10 mg by mouth daily as needed for muscle spasms.    . fluticasone (FLONASE) 50 MCG/ACT nasal spray Place 1 spray into both nostrils daily as needed for allergies or rhinitis.    Marland Kitchen LORazepam (ATIVAN) 0.5 MG tablet Take 0.25 mg by mouth every 8 (eight) hours as needed for anxiety or sleep.     . meclizine (ANTIVERT) 25 MG tablet Take 1 tablet (25 mg total) by mouth 3 (three) times daily as needed for dizziness. 21 tablet 0  . meloxicam (MOBIC) 15 MG tablet Take 1 tablet (15 mg total) by mouth daily. 30 tablet 0  . Multiple Vitamins-Minerals (HAIR/SKIN/NAILS PO) Take 2 tablets by mouth daily.    . potassium chloride (K-DUR) 10 MEQ tablet Take 1 tablet (10 mEq total) by mouth daily. 7 tablet 0  . propranolol (INDERAL) 40 MG tablet Take 1 tablet (40 mg total) by mouth 3 (three) times daily as needed (anxiety). 60 tablet 0  .  Vitamin D, Ergocalciferol, (DRISDOL) 50000 UNITS CAPS capsule Take 50,000 Units by mouth every 7 (seven) days. Monday  0   No current facility-administered medications on file prior to visit.    PAST MEDICAL HISTORY: Past Medical History  Diagnosis Date  . Hypertension   . GERD (gastroesophageal reflux disease)   .  Tachycardia     PAST SURGICAL HISTORY: Past Surgical History  Procedure Laterality Date  . Wisdom tooth extraction      FAMILY HISTORY: Family History  Problem Relation Age of Onset  . Seizures Mother   . Cancer Father     Liver  . Diabetes Father 69  . Hypertension Father   . Heart disease Father 75    CEA, LE [place]  . Alzheimer's disease Maternal Grandmother   . Heart disease Maternal Grandfather     SOCIAL HISTORY:  History   Social History  . Marital Status: Single    Spouse Name: N/A    Number of Children: 0  . Years of Education: 16   Occupational History  . Customer Service [employer]   Social History Main Topics  . Smoking status: Former Smoker    Types: Cigarettes  . Smokeless tobacco: Never Used     Comment: Quit smoking last month  . Alcohol Use: 0.0 oz/week    0 Not specified per week     Comment: Usually 1 time a month  . Drug Use: No  . Sexual Activity:    Partners: Male    Birth Control/ Protection: Other-see comments     Comment: NuvaRing   Other Topics Concern  . Not on file   Social History Narrative   Patient lives at home alone .   Patient works full time at [employer]   Education college.   Right handed.   Caffeine none     PHYSICAL EXAM   Filed Vitals:   09/11/14 0810  BP: 155/78  Pulse: 74  Height: 5' 3.5" (1.613 m)  Weight: 211 lb (95.709 kg)    Not recorded      Body mass index is 36.79 kg/(m^2).   Generalized: In no acute distress  Neck: Supple, no carotid bruits   Cardiac: Regular rate rhythm  Pulmonary: Clear to auscultation bilaterally  Musculoskeletal: No deformity  Neurological examination  Mentation: Alert oriented to time, place, history taking, and causual conversation  Cranial nerve II-XII: Pupils were equal round reactive to light. Extraocular movements were full.  Visual field were full on confrontational test. Bilateral fundi were sharp.  Facial sensation and strength were  normal. Hearing was intact to finger rubbing bilaterally. Uvula tongue midline.  Head turning and shoulder shrug and were normal and symmetric.Tongue protrusion into cheek strength was normal.  Motor: Normal tone, bulk and strength.  Sensory: Intact to fine touch, pinprick, preserved vibratory sensation, and proprioception at toes.  Coordination: Normal finger to nose, heel-to-shin bilaterally there was no truncal ataxia  Gait: Rising up from seated position without assistance, normal stance, without trunk ataxia, moderate stride, good arm swing, smooth turning, able to perform tiptoe, and heel walking without difficulty.   Romberg signs: Negative  Deep tendon reflexes: Brachioradialis 2/2, biceps 2/2, triceps 2/2, patellar 2/2, Achilles 2/2, plantar responses were flexor bilaterally.   DIAGNOSTIC DATA (LABS, IMAGING, TESTING) - I reviewed patient records, labs, notes, testing and imaging myself where available.  Lab Results  Component Value Date   WBC 11.5* 09/09/2014   HGB 13.5 09/09/2014   HCT 39.3 09/09/2014  MCV 86.8 09/09/2014   PLT 291 09/09/2014      Component Value Date/Time   NA 139 09/09/2014 0122   K 3.6* 09/09/2014 0122   CL 100 09/09/2014 0122   CO2 24 09/09/2014 0122   GLUCOSE 146* 09/09/2014 0122   BUN 13 09/09/2014 0122   CREATININE 0.95 09/09/2014 0122   CREATININE 0.99 09/02/2014 2014   CALCIUM 10.0 09/09/2014 0122   PROT 7.0 08/26/2014 2112   ALBUMIN 4.1 08/26/2014 2112   AST 16 08/26/2014 2112   ALT 13 08/26/2014 2112   ALKPHOS 65 08/26/2014 2112   BILITOT 0.4 08/26/2014 2112   GFRNONAA 74* 09/09/2014 0122   GFRAA 86* 09/09/2014 0122   No results found for: CHOL, HDL, LDLCALC, LDLDIRECT, TRIG, CHOLHDL Lab Results  Component Value Date   HGBA1C 6.1 08/04/2014   No results found for: VITAMINB12 Lab Results  Component Value Date   TSH 2.080 08/24/2014      ASSESSMENT AND PLAN  Kmya Placide Felice-Jennings is a 38 y.o. female complains of   Recurrent episode of heart racing fast, dizziness, essentially normal neurological examination, CAT scan of the brain,  1, differentiation diagnosis including cardiac arrhythmia, panic attack, simple partial seizure is only remote possibility 2, will complete evaluation with MRI of the brain, EEG,   Marcial Pacas, M.D. Ph.D.  [facility] [address] [place], [place] [address] [phone]

## 2014-09-11 NOTE — Progress Notes (Signed)
HPI The patient presents for evaluation of dizziness and presyncope. She has had 3 ER visits since October 18 and I reviewed all of these. She's had sinus tachycardia with heart rates as high as 138 recorded.  Described as having panic. During her first visit she did have a very low potassium but it has otherwise been supplemented and okay. She's had no dysrhythmias other than sinus tachycardia noted. She did see a neurologist this morning and is having workup. She's been treated for possible anxiety. She says that these episodes can happen in the middle of the night and wake her from her sleep. She's felt her heart racing. She's had some aching in her jaw chest. She's had one episode was triggered by somebody giving her something to drink that might of had something in it. She's saying that symptoms have been everyday although is not waking her from her sleep every day. For severe episodes are less frequent. She's not had any frank syncope. She's been trying to exercise. She says if she does some running she might get some discomfort. However, she's not describing classic substernal chest pressure, neck or arm discomfort with this level of activity.  Allergies  Allergen Reactions  . Penicillins Hives  . Tamiflu [Oseltamivir Phosphate] Nausea And Vomiting    Current Outpatient Prescriptions  Medication Sig Dispense Refill  . acetaminophen (TYLENOL) 500 MG tablet Take 1,000 mg by mouth every 6 (six) hours as needed for mild pain.    Marland Kitchen albuterol (PROVENTIL HFA;VENTOLIN HFA) 108 (90 BASE) MCG/ACT inhaler Inhale 2 puffs into the lungs every 6 (six) hours as needed for wheezing or shortness of breath.    . ALPRAZolam (NIRAVAM) 0.5 MG dissolvable tablet Take 1-2 tablets (0.5-1 mg total) by mouth 3 (three) times daily as needed (panic attack). 30 tablet 0  . atenolol (TENORMIN) 50 MG tablet Take 1 tablet (50 mg total) by mouth daily. 30 tablet 0  . clonazePAM (KLONOPIN) 0.5 MG tablet Take 0.5-1 tablets  (0.25-0.5 mg total) by mouth 2 (two) times daily as needed for anxiety. 20 tablet 0  . cyclobenzaprine (FLEXERIL) 10 MG tablet Take 10 mg by mouth daily as needed for muscle spasms.    . fluticasone (FLONASE) 50 MCG/ACT nasal spray Place 1 spray into both nostrils daily as needed for allergies or rhinitis.    Marland Kitchen LORazepam (ATIVAN) 0.5 MG tablet Take 0.25 mg by mouth every 8 (eight) hours as needed for anxiety or sleep.     . meclizine (ANTIVERT) 25 MG tablet Take 1 tablet (25 mg total) by mouth 3 (three) times daily as needed for dizziness. 21 tablet 0  . meloxicam (MOBIC) 15 MG tablet Take 1 tablet (15 mg total) by mouth daily. 30 tablet 0  . Multiple Vitamins-Minerals (HAIR/SKIN/NAILS PO) Take 2 tablets by mouth daily.    . potassium chloride (K-DUR) 10 MEQ tablet Take 1 tablet (10 mEq total) by mouth daily. 7 tablet 0  . propranolol (INDERAL) 40 MG tablet Take 1 tablet (40 mg total) by mouth 3 (three) times daily as needed (anxiety). 60 tablet 0  . Vitamin D, Ergocalciferol, (DRISDOL) 50000 UNITS CAPS capsule Take 50,000 Units by mouth every 7 (seven) days. Monday  0   No current facility-administered medications for this visit.    Past Medical History  Diagnosis Date  . Hypertension   . GERD (gastroesophageal reflux disease)   . Tachycardia   . Anxiety     Past Surgical History  Procedure Laterality Date  .  Wisdom tooth extraction      Family History  Problem Relation Age of Onset  . Seizures Mother   . Cancer Father     Liver  . Diabetes Father 87  . Hypertension Father   . Heart disease Father     CEA, LE Stenting  . Alzheimer's disease Maternal Grandmother   . Heart disease Maternal Grandfather     History   Social History  . Marital Status: Single    Spouse Name: N/A    Number of Children: 0  . Years of Education: 16   Occupational History  . Customer Service [employer]   Social History Main Topics  . Smoking status: Former Smoker    Types:  Cigarettes    Quit date: 08/07/2012  . Smokeless tobacco: Never Used     Comment: Maybe six cigarerres a week  . Alcohol Use: 0.0 oz/week    0 Not specified per week     Comment: Usually 1 time a month  . Drug Use: No  . Sexual Activity:    Partners: Male    Birth Control/ Protection: Other-see comments     Comment: NuvaRing   Other Topics Concern  . Not on file   Social History Narrative   Patient lives at home alone .   Patient works full time at [employer]   Education college.   Right handed.   Caffeine none    ROS:  As stated in the HPI and negative for all other systems.  PHYSICAL EXAM BP 122/82 mmHg  Pulse 64  Ht 5\' 3"  (1.6 m)  Wt 212 lb 3.2 oz (96.253 kg)  BMI 37.60 kg/m2  LMP 09/09/2014  GENERAL:  Well appearing HEENT:  Pupils equal round and reactive, fundi not visualized, oral mucosa unremarkable NECK:  No jugular venous distention, waveform within normal limits, carotid upstroke brisk and symmetric, no bruits, no thyromegaly LYMPHATICS:  No cervical, inguinal adenopathy LUNGS:  Clear to auscultation bilaterally BACK:  No CVA tenderness CHEST:  Unremarkable HEART:  PMI not displaced or sustained,S1 and S2 within normal limits, no S3, no S4, no clicks, no rubs, no murmurs ABD:  Flat, positive bowel sounds normal in frequency in pitch, no bruits, no rebound, no guarding, no midline pulsatile mass, no hepatomegaly, no splenomegaly EXT:  2 plus pulses throughout, no edema, no cyanosis no clubbing SKIN:  No rashes no nodules NEURO:  Cranial nerves II through XII grossly intact, motor grossly intact throughout PSYCH:  Cognitively intact, oriented to person place and time  EKG:  Sinus rhythm, rate 72, axis within normal limits, intervals within normal limits, no acute ST-T wave changes.  09/09/14  ASSESSMENT AND PLAN  TACHYCARDIA/PRESYNCOPE:  I will start with an event monitor to try to get her severe episodes. Brooke Turner will need a 21 day event  monitor.  The patients symptoms necessitate an event monitor.  The symptoms are too infrequent to be identified on a Holter monitor.   I have a very low suspicion that she has obstructive coronary disease. I might however followup with an echocardiogram to further evaluate she continues to have these complaints. Certainly any is a potential etiology and should continue to be managed by her primary provider. Of note I did not advise her to take the propranolol since she is already on atenolol.  Multiple ER visit records and labs reviewed.

## 2014-09-11 NOTE — Telephone Encounter (Signed)
Pt has appt today with Dr Percival Spanish.  Sorry  That I did not get my note written from Tues night - I finally saw her at 9:15 and spent 45 mins with her.

## 2014-09-12 ENCOUNTER — Telehealth: Payer: Self-pay

## 2014-09-12 ENCOUNTER — Other Ambulatory Visit: Payer: Self-pay | Admitting: Sports Medicine

## 2014-09-12 DIAGNOSIS — M25531 Pain in right wrist: Secondary | ICD-10-CM

## 2014-09-12 NOTE — Telephone Encounter (Signed)
Pt has some questions and concerns for clinical staff. Please return call

## 2014-09-12 NOTE — Progress Notes (Signed)
Subjective:    Patient ID: Brooke Turner, female    DOB: [birth year], 38 y.o.   MRN: [id number]  HPI Pt presents to clinic for a recheck.  She feels terrible.  She had another episode of chest pain and dizziness that caused her to go to the ED today and she is here for f/u.  She wants her cardiologist appt moved sooner because she does not think that she can wait that long.  She is having these episodes daily.  They start with feeling dizzy though the room is not spinning and then she notices that her HR is increased and then she gets upset. She feels like she might be having something happen in her head because she has also had headaches and wants to have an MRI to make sure there is nothing wrong with her brain and she wants to see a neurologist.  She is also sure that there is something wrong with her heart causing his high HR.  She stopped her lisinopril but has felt not difference in the last 3 days since she stopped the medication.  She is trying to work but it is hard due to these episodes. She is not sleeping well at night.  The Xanax and ativan make her to sleepy for her to feel comfortable taking them.  Review of Systems  Constitutional: Negative for fever and chills.  Respiratory: Negative for cough and chest tightness.   Cardiovascular: Positive for chest pain and palpitations. Negative for leg swelling.  Psychiatric/Behavioral: The patient is nervous/anxious.        Objective:   Physical Exam  Constitutional: She is oriented to person, place, and time. She appears well-developed and well-nourished.  BP 110/74 mmHg  Pulse 75  Temp(Src) 98.5 F (36.9 C) (Oral)  Resp 18  Ht 5' 2.75" (1.594 m)  Wt 215 lb (97.523 kg)  BMI 38.38 kg/m2  SpO2 98%  LMP 09/09/2014   HENT:  Head: Normocephalic and atraumatic.  Right Ear: External ear normal.  Left Ear: External ear normal.  Cardiovascular: Normal rate, regular rhythm and normal heart sounds.   No murmur  heard. Pulmonary/Chest: Effort normal and breath sounds normal. She has no wheezes.  Musculoskeletal: Normal range of motion.  Neurological: She is alert and oriented to person, place, and time. She has normal reflexes.  Skin: Skin is warm and dry.  Psychiatric: Her behavior is normal. Judgment and thought content normal.  Anxious and tearful   Vitals reviewed.       Assessment & Plan:  Dizziness and giddiness - Plan: meclizine (ANTIVERT) 25 MG tablet, Ambulatory referral to Neurology  Tachycardia - Plan: atenolol (TENORMIN) 50 MG tablet  Anxiety attack - Plan: clonazePAM (KLONOPIN) 0.5 MG tablet  I still suspect that the patient is having panic attacks and need anxiety treatment but she is worried about medical conditions.  At this time she has a referral for cardiology and we will go with that appt (it is next week which I think is appropriate considering she has had normal EKG at hospital and her HR here at her last several visits since these episodes started have been in the 60s.)  We will stop her BP medications because her BP is lower and though I doubt this is causing her dizziness that might help the patient accept her anxiety attacks for what they are.  We will continue the atenolol to help with the heart rate control as this is one of her main complaints.  We  will start her on Antivert as a treatment for vertigo though this is low likelihood but will once again help with her accepting her mostly likely diagnosis.  I cautioned her on possible grogginess.  If she gets relief great - if not she will try Klonopin in hopes that it will help her anxiety and not make her groggy.  We did discuss that maybe she should consider no work for the next several days to allow her to take these sedating medications in hopes that we can get these episodes under control.  I did do a referral to neuro as patient;s request.    Windell Hummingbird PA-C  [facility] 09/12/2014 8:48 PM

## 2014-09-13 ENCOUNTER — Emergency Department (HOSPITAL_COMMUNITY)
Admission: EM | Admit: 2014-09-13 | Discharge: 2014-09-13 | Disposition: A | Discharge status: Home / Self Care | Payer: 59 | Attending: Emergency Medicine | Admitting: Emergency Medicine

## 2014-09-13 ENCOUNTER — Ambulatory Visit (INDEPENDENT_AMBULATORY_CARE_PROVIDER_SITE_OTHER): Payer: 59 | Admitting: Family Medicine

## 2014-09-13 ENCOUNTER — Telehealth: Payer: Self-pay

## 2014-09-13 ENCOUNTER — Encounter (HOSPITAL_COMMUNITY): Payer: Self-pay | Admitting: Emergency Medicine

## 2014-09-13 VITALS — BP 138/82 | HR 78 | Temp 98.7°F | Resp 18 | Ht 62.75 in | Wt 214.2 lb

## 2014-09-13 DIAGNOSIS — R55 Syncope and collapse: Secondary | ICD-10-CM | POA: Diagnosis present

## 2014-09-13 DIAGNOSIS — R030 Elevated blood-pressure reading, without diagnosis of hypertension: Secondary | ICD-10-CM

## 2014-09-13 DIAGNOSIS — F410 Panic disorder [episodic paroxysmal anxiety] without agoraphobia: Secondary | ICD-10-CM

## 2014-09-13 DIAGNOSIS — Z880 Allergy status to penicillin: Secondary | ICD-10-CM | POA: Insufficient documentation

## 2014-09-13 DIAGNOSIS — F4322 Adjustment disorder with anxiety: Secondary | ICD-10-CM

## 2014-09-13 DIAGNOSIS — Z79899 Other long term (current) drug therapy: Secondary | ICD-10-CM | POA: Insufficient documentation

## 2014-09-13 DIAGNOSIS — Z87891 Personal history of nicotine dependence: Secondary | ICD-10-CM | POA: Diagnosis not present

## 2014-09-13 DIAGNOSIS — R002 Palpitations: Secondary | ICD-10-CM

## 2014-09-13 DIAGNOSIS — I10 Essential (primary) hypertension: Secondary | ICD-10-CM | POA: Diagnosis not present

## 2014-09-13 DIAGNOSIS — Z8719 Personal history of other diseases of the digestive system: Secondary | ICD-10-CM | POA: Insufficient documentation

## 2014-09-13 DIAGNOSIS — R42 Dizziness and giddiness: Secondary | ICD-10-CM

## 2014-09-13 DIAGNOSIS — Z791 Long term (current) use of non-steroidal anti-inflammatories (NSAID): Secondary | ICD-10-CM | POA: Insufficient documentation

## 2014-09-13 DIAGNOSIS — IMO0001 Reserved for inherently not codable concepts without codable children: Secondary | ICD-10-CM

## 2014-09-13 LAB — BASIC METABOLIC PANEL
Anion gap: 15 (ref 5–15)
BUN: 15 mg/dL (ref 6–23)
CO2: 24 mEq/L (ref 19–32)
Calcium: 9.3 mg/dL (ref 8.4–10.5)
Chloride: 101 mEq/L (ref 96–112)
Creatinine, Ser: 1.09 mg/dL (ref 0.50–1.10)
GFR calc Af Amer: 73 mL/min — ABNORMAL LOW (ref >90)
GFR calc non Af Amer: 63 mL/min — ABNORMAL LOW (ref >90)
Glucose, Bld: 115 mg/dL — ABNORMAL HIGH (ref 70–99)
Potassium: 3.7 mEq/L (ref 3.7–5.3)
Sodium: 140 mEq/L (ref 137–147)

## 2014-09-13 LAB — CBC
HCT: 39.4 % (ref 36.0–46.0)
Hemoglobin: 13.7 g/dL (ref 12.0–15.0)
MCH: 30.2 pg (ref 26.0–34.0)
MCHC: 34.8 g/dL (ref 30.0–36.0)
MCV: 87 fL (ref 78.0–100.0)
Platelets: 286 10*3/uL (ref 150–400)
RBC: 4.53 MIL/uL (ref 3.87–5.11)
RDW: 12.4 % (ref 11.5–15.5)
WBC: 11.1 10*3/uL — ABNORMAL HIGH (ref 4.0–10.5)

## 2014-09-13 LAB — TSH: TSH: 1.25 u[IU]/mL (ref 0.350–4.500)

## 2014-09-13 LAB — MAGNESIUM: Magnesium: 1.7 mg/dL (ref 1.5–2.5)

## 2014-09-13 NOTE — ED Provider Notes (Signed)
CSN: [id number]     Arrival date & time 09/13/14  0208 History   First MD Initiated Contact with Patient 09/13/14 0415     Chief Complaint  Patient presents with  . Near Syncope     (Consider location/radiation/quality/duration/timing/severity/associated sxs/prior Treatment) HPI  Brooke Turner is a 38 y.o. female with past medical history of hypertension, reflux, tachycardia ( possibly SVT), presenting with panic attacks, dizziness, presyncope and lightheadedness. Patient has been seen for this multiple times over the past month. She states this all began when she believes someone slipped ecstasy and she went into SVT. Since then she's had random occurrences of hypotension and has been taken off 2 of her high blood pressure medicines. She becomes presyncopal, see spots but never has true syncope. She states she is short of breath during the interval and diaphoretic as well. She has been seen by cardiology and is currently wearing a monitor, she has been seen by neurology in the EEG pending, she's been seen by her primary care physician as well. Patient presents because this evening her symptoms recurred with hypotension and lightheadedness. She currently has no chest pain or shortness of breath, she denies any recent infections. Patient has no further complaints.  10 Systems reviewed and are negative for acute change except as noted in the HPI.     Past Medical History  Diagnosis Date  . Hypertension   . GERD (gastroesophageal reflux disease)   . Tachycardia    Past Surgical History  Procedure Laterality Date  . Wisdom tooth extraction     Family History  Problem Relation Age of Onset  . Seizures Mother   . Cancer Father     Liver  . Diabetes Father 31  . Hypertension Father   . Heart disease Father 48    CEA, LE Stenting  . Alzheimer's disease Maternal Grandmother   . Heart disease Maternal Grandfather    History  Substance Use Topics  . Smoking status: Former  Smoker    Types: Cigarettes  . Smokeless tobacco: Never Used     Comment: Quit smoking last month  . Alcohol Use: 0.0 oz/week    0 Not specified per week     Comment: Usually 1 time a month   OB History    Gravida Para Term Preterm AB TAB SAB Ectopic Multiple Living   2 0 0 0 2 1 1 0 0 0      Review of Systems    Allergies  Penicillins and Tamiflu  Home Medications   Prior to Admission medications   Medication Sig Start Date End Date Taking? Authorizing Provider  albuterol (PROVENTIL HFA;VENTOLIN HFA) 108 (90 BASE) MCG/ACT inhaler Inhale 2 puffs into the lungs every 6 (six) hours as needed for wheezing or shortness of breath.   Yes Historical Provider, MD  cyclobenzaprine (FLEXERIL) 10 MG tablet Take 10 mg by mouth daily as needed for muscle spasms.   Yes Historical Provider, MD  fluticasone (FLONASE) 50 MCG/ACT nasal spray Place 1 spray into both nostrils daily as needed for allergies or rhinitis.   Yes Historical Provider, MD  LORazepam (ATIVAN) 0.5 MG tablet Take 0.25 mg by mouth every 8 (eight) hours as needed for anxiety or sleep.    Yes Historical Provider, MD  meclizine (ANTIVERT) 25 MG tablet Take 1 tablet (25 mg total) by mouth 3 (three) times daily as needed for dizziness. 09/09/14  Yes Mancel Bale, PA-C  Multiple Vitamins-Minerals (HAIR/SKIN/NAILS PO) Take 2 tablets by mouth  daily.   Yes Historical Provider, MD  propranolol (INDERAL) 40 MG tablet Take 1 tablet (40 mg total) by mouth 3 (three) times daily as needed (anxiety). 09/10/14  Yes [name], MD  Vitamin D, Ergocalciferol, (DRISDOL) 50000 UNITS CAPS capsule Take 50,000 Units by mouth every 7 (seven) days. Monday 07/30/14  Yes Historical Provider, MD  acetaminophen (TYLENOL) 500 MG tablet Take 1,000 mg by mouth every 6 (six) hours as needed for mild pain.    Historical Provider, MD  ALPRAZolam (NIRAVAM) 0.5 MG dissolvable tablet Take 1-2 tablets (0.5-1 mg total) by mouth 3 (three) times daily as needed (panic attack).  08/26/14   [name], MD  atenolol (TENORMIN) 50 MG tablet Take 1 tablet (50 mg total) by mouth daily. 09/09/14   Mancel Bale, PA-C  clonazePAM (KLONOPIN) 0.5 MG tablet Take 0.5-1 tablets (0.25-0.5 mg total) by mouth 2 (two) times daily as needed for anxiety. 09/09/14   Mancel Bale, PA-C  meloxicam (MOBIC) 15 MG tablet Take 1 tablet (15 mg total) by mouth daily. 08/04/14   [name], MD  potassium chloride (K-DUR) 10 MEQ tablet Take 1 tablet (10 mEq total) by mouth daily. 08/24/14   Hoy Morn, MD   BP 136/60 mmHg  Pulse 66  Temp(Src) 98.2 F (36.8 C) (Oral)  Resp 16  Ht 5\' 4"  (1.626 m)  Wt 212 lb (96.163 kg)  BMI 36.37 kg/m2  SpO2 100%  LMP 09/09/2014 Physical Exam  Constitutional: She is oriented to person, place, and time. She appears well-developed and well-nourished. No distress.  HENT:  Head: Normocephalic and atraumatic.  Nose: Nose normal.  Mouth/Throat: Oropharynx is clear and moist. No oropharyngeal exudate.  Eyes: Conjunctivae and EOM are normal. Pupils are equal, round, and reactive to light. No scleral icterus.  Neck: Normal range of motion. Neck supple. No JVD present. No tracheal deviation present. No thyromegaly present.  Cardiovascular: Normal rate, regular rhythm and normal heart sounds.  Exam reveals no gallop and no friction rub.   No murmur heard. Pulmonary/Chest: Effort normal and breath sounds normal. No respiratory distress. She has no wheezes. She exhibits no tenderness.  Abdominal: Soft. Bowel sounds are normal. She exhibits no distension and no mass. There is no tenderness. There is no rebound and no guarding.  Musculoskeletal: Normal range of motion. She exhibits no edema or tenderness.  Lymphadenopathy:    She has no cervical adenopathy.  Neurological: She is alert and oriented to person, place, and time. No cranial nerve deficit. She exhibits normal muscle tone.  5 out of 5 strength 4 extremities, normal sensation 4 extremity strength.  Skin:  Skin is warm and dry. No rash noted. She is not diaphoretic. No erythema. No pallor.  Nursing note and vitals reviewed.   ED Course  Procedures (including critical care time) Labs Review Labs Reviewed  CBC - Abnormal; Notable for the following:    WBC 11.1 (*)    All other components within normal limits  BASIC METABOLIC PANEL - Abnormal; Notable for the following:    Glucose, Bld 115 (*)    GFR calc non Af Amer 63 (*)    GFR calc Af Amer 73 (*)    All other components within normal limits  TSH  MAGNESIUM  T4, FREE  METANEPHRINES, PLASMA    Imaging Review No results found.   EKG Interpretation   Date/Time:  Saturday September 13 2014 05:18:03 EST Ventricular Rate:  56 PR Interval:  183 QRS  Duration: 91 QT Interval:  431 QTC Calculation: 416 R Axis:   35 Text Interpretation:  Sinus rhythm Low voltage, precordial leads No  significant change since last tracing Confirmed by Glynn Octave  [phone]) on 09/13/2014 5:21:39 AM      MDM   Final diagnoses:  None    Patient presents emergency department for continued lightheadedness. I am not sure what the etiology isn't patient has multiple specialties on board for evaluation. Her laboratory studies, and EKG are unchanged from previous. She is instructed to follow-up with all of her physicians for continued evaluation. Plasma metanephrines were sent as well and patient was informed that Brooke not come back for about one week. TSH is normal with free T4 pending.  Laboratory values have been unchanged since previous testing. She remains asymptomatic in the emergency department. Her vital signs remain within her normal limits and she is safe for discharge.  Her symptoms were discussed at length as well as her need to follow up with all of her specialists.    [name], MD 09/13/14 [phone]

## 2014-09-13 NOTE — Discharge Instructions (Signed)
Dizziness Ms. Brooke Turner, you were seen for dizziness. Her laboratory studies and EKG are unchanged from previous. Follow-up with your physicians for continued evaluation of her symptoms. She primary care physician within 3 days for continued care. If any symptoms worsen come back to emergency department for repeat evaluation. Thank you.  Dizziness means you feel unsteady or lightheaded. You might feel like you are going to pass out (faint). HOME CARE   Drink enough fluids to keep your pee (urine) clear or pale yellow.  Take your medicines exactly as told by your doctor. If you take blood pressure medicine, always stand up slowly from the lying or sitting position. Hold on to something to steady yourself.  If you need to stand in one place for a long time, move your legs often. Tighten and relax your leg muscles.  Have someone stay with you until you feel okay.  Do not drive or use heavy machinery if you feel dizzy.  Do not drink alcohol. GET HELP RIGHT AWAY IF:   You feel dizzy or lightheaded and it gets worse.  You feel sick to your stomach (nauseous), or you throw up (vomit).  You have trouble talking or walking.  You feel weak or have trouble using your arms, hands, or legs.  You cannot think clearly or have trouble forming sentences.  You have chest pain, belly (abdominal) pain, sweating, or you are short of breath.  Your vision changes.  You are bleeding.  You have problems from your medicine that seem to be getting worse. MAKE SURE YOU:   Understand these instructions.  Will watch your condition.  Will get help right away if you are not doing well or get worse. Document Released: 10/13/2011 Document Revised: 01/16/2012 Document Reviewed: 10/13/2011 [facility] Patient Information 2015 [place], [place]. This information is not intended to replace advice given to you by your health care provider. Make sure you discuss any questions you have with your health care  provider.

## 2014-09-13 NOTE — ED Notes (Addendum)
EKG given to Dr. Claudine Mouton for review

## 2014-09-13 NOTE — ED Notes (Signed)
Pt arrived to the ED with a complaint of near syncope.  Pt is on  Heart monitor from her cardiologist.  Pt states she has episodes of dizziness and headaches.  Pt called her doctor who told her to eat something saltyand lay down but symptoms persisted so she came to the ED.

## 2014-09-13 NOTE — ED Notes (Signed)
EKG given to EDP Oni for review 

## 2014-09-13 NOTE — Telephone Encounter (Signed)
This information was transferred to a different phone message due to duplication.

## 2014-09-13 NOTE — Telephone Encounter (Signed)
Dr Brigitte Pulse states from a different phone message - If she saw the cardiologist she should do whatever the cardiologist says.  OK to just stay on the atenolol and not fill the propranolol for now - cancel this rx. If he has any other needs, she should follow up with me in clinic and we can discuss the wide variety of medication options and changes.  Please call the patient because she called Dr Brigitte Pulse last pm and her BP was low.  Dr Brigitte Pulse wants to to take Atenolol 25mg  (that is 1/2 of the pill that she has that is 50mg )

## 2014-09-13 NOTE — Progress Notes (Addendum)
Subjective:  This chart was scribed for Brooke Cheadle, MD by Brooke Turner, ED Scribe at [facility].The patient was seen in exam room 12 and the patient's care was started at 2:36 PM.   Patient ID: Brooke Turner, female    DOB: 09-26-1976, 38 y.o.   MRN: [id number]  Chief Complaint  Patient presents with  . Follow-up    ED visit follow up. Pt. went this morning due to feeling like she was about to pass out.   . Shaking    Pt. states she has shaking spells. She feels them most in the morning. She said she that she can't stop shaking today.    HPI HPI Comments: Brooke Turner is a 38 y.o. female with a history of HTN, DM, anxiety, and tachycardia who presents to [facility] here for a follow up. Pt notes she dosed off in her car for about 20 min after she got home from the ED. When she woke up she was trembling and shaking, her heart was racing, and felt faint then after the shaking her face becomes warm. She says the shaking persisted and she feels like she is going pass out. She describes the shaking as someone suffering from withdrawal. Pt notes feeling exhausted and this issue is taking a lot out of her. Pt says she feels confused and notes she is not thinking straight. She feels like her life is falling apart. She says she is afraid that something my trigger an episode she is worried about what she eats, drink and where she goes.  Pt states she lives alone and is afraid she is going to pass out and nobody will be there to help. Her family lives in [place]. She notes last time she was up in [place] she did not have as many episodes but when she came back to [place] the episodes returned to a daily basis.   Pt was in the ED very early this morning   She is going to see a neurologist on Tuesday for an EEG and has not set up an appointment for an MRI.  Pt took alprazolam for two days. The first day she felt faint at first but felt fine at later. The second day  while driving she felt very faint and her heart starting racing so she had to pull over and take deep breathes. She did not take the klonopin because she was told it may effect her personality. She says the lorazepam "once in a blue moon" which has helped with her anxiety but only takes half the dose because she is worried it may drop her BP. PT takes prescribed vitamin D, occassionally uses her inhaler when she exercise. She also takes tylenol if she gets HA. Pt was taking 50 mg of atenolol but no longer takes it.  Pt says it has been difficult to work, she has had to leave several times because of an episode that occurs while working. She works in Therap[employer], art primarily on the phone.  She was in the ER October 8th for a traumatic event and thinks it she maybe suffering from PSTD because of the event.   Past Medical History  Diagnosis Date  . Hypertension   . GERD (gastroesophageal reflux disease)   . Tachycardia    Current Outpatient Prescriptions on File Prior to Visit  Medication Sig Dispense Refill  . acetaminophen (TYLENOL) 500 MG tablet Take 1,000 mg by mouth every 6 (six) hours  as needed for mild pain.    Marland Kitchen albuterol (PROVENTIL HFA;VENTOLIN HFA) 108 (90 BASE) MCG/ACT inhaler Inhale 2 puffs into the lungs every 6 (six) hours as needed for wheezing or shortness of breath.    . ALPRAZolam (NIRAVAM) 0.5 MG dissolvable tablet Take 1-2 tablets (0.5-1 mg total) by mouth 3 (three) times daily as needed (panic attack). 30 tablet 0  . atenolol (TENORMIN) 50 MG tablet Take 1 tablet (50 mg total) by mouth daily. 30 tablet 0  . clonazePAM (KLONOPIN) 0.5 MG tablet Take 0.5-1 tablets (0.25-0.5 mg total) by mouth 2 (two) times daily as needed for anxiety. 20 tablet 0  . cyclobenzaprine (FLEXERIL) 10 MG tablet Take 10 mg by mouth daily as needed for muscle spasms.    . fluticasone (FLONASE) 50 MCG/ACT nasal spray Place 1 spray into both nostrils daily as needed for allergies or rhinitis.    Marland Kitchen  LORazepam (ATIVAN) 0.5 MG tablet Take 0.25 mg by mouth every 8 (eight) hours as needed for anxiety or sleep.     . meclizine (ANTIVERT) 25 MG tablet Take 1 tablet (25 mg total) by mouth 3 (three) times daily as needed for dizziness. 21 tablet 0  . meloxicam (MOBIC) 15 MG tablet Take 1 tablet (15 mg total) by mouth daily. 30 tablet 0  . Multiple Vitamins-Minerals (HAIR/SKIN/NAILS PO) Take 2 tablets by mouth daily.    . potassium chloride (K-DUR) 10 MEQ tablet Take 1 tablet (10 mEq total) by mouth daily. 7 tablet 0  . propranolol (INDERAL) 40 MG tablet Take 1 tablet (40 mg total) by mouth 3 (three) times daily as needed (anxiety). 60 tablet 0  . Vitamin D, Ergocalciferol, (DRISDOL) 50000 UNITS CAPS capsule Take 50,000 Units by mouth every 7 (seven) days. Monday  0   No current facility-administered medications on file prior to visit.   Allergies  Allergen Reactions  . Penicillins Hives  . Tamiflu [Oseltamivir Phosphate] Nausea And Vomiting   Review of Systems  Neurological: Positive for dizziness and light-headedness.  Psychiatric/Behavioral: Positive for confusion. The patient is nervous/anxious.       Objective:  BP 138/82 mmHg  Pulse 78  Temp(Src) 98.7 F (37.1 C) (Oral)  Resp 18  Ht 5' 2.75" (1.594 m)  Wt 214 lb 3.2 oz (97.16 kg)  BMI 38.24 kg/m2  SpO2 100%  LMP 09/09/2014  Physical Exam  Constitutional: She is oriented to person, place, and time. She appears well-developed and well-nourished. No distress.  HENT:  Head: Normocephalic and atraumatic.  Right Ear: External ear normal.  Eyes: Conjunctivae are normal. No scleral icterus.  Pulmonary/Chest: Effort normal.  Neurological: She is alert and oriented to person, place, and time.  Skin: Skin is warm and dry. She is not diaphoretic. No erythema.  Psychiatric: Her speech is normal and behavior is normal. Her mood appears anxious. Cognition and memory are normal.       Assessment & Plan:  Adjustment disorder with  anxious mood  Panic attack - pt does not belief that her sxs are due to panic attacks despite negative w/u w/ cardiology, neurology and mult ER and clinic visits w/ all physicians agreeing that sxs are caused by panic.  Needs to make appt w/ psych - names/number provided - needs to call THIS WEEK.  Recommend that keep a log of her sxs as well as her BP and cbgs as well as food diarry to see if she can correlate sxs at all. Pt again encouraged to try the propranolol prn  w/ lorazepam at night as a therapeutic trial - see AVS for details of propranolol. Discussed pt starting on either citalopram or buspar - will try buspar first since quicker onset of action but did review potential side facts of tremor, palp, HA, dizziness when starting but will try to avoid by starting on a very low dose of buspar and any side effects will likely recede in 2-3 wks. If she can't tolerate or no improvement, wil try citalopram instead.  Lightheadedness - neurology referral P.  Elevated blood pressure - rec flp at f/u - could consider also a.m. Cortisol. . .   Palpitations - f/u on results of event monitor w/ cardiology    I personally performed the services described in this documentation, which was scribed in my presence. The recorded information has been reviewed and considered, and addended by me as needed.  Brooke Cheadle, MD MPH   Over 40 min spent in face-to-face evaluation of and consultation with patient and coordination of care.

## 2014-09-13 NOTE — Patient Instructions (Signed)
Please come to the office FASTING for you visit on Thursday.  We will repeat some blood work and check additional labs at that time.  Bring your bp/hr/cbg/food and symptoms log with you. Please go home tonight and try a lorazepam to see how you do.  Tomorrow morning, try taking one of the propranolol to see how you respond.  If you have an episode of palpitations and your heart is racing and your blood pressure is >601 systolic, then you can try taking a propranolol to see how you responds. We should consider trying you on either a low dose of citalopram (would put you on 5 mg = 1/2 of the lowest 10mg  dose) or on buspirone - we will discuss this on Thursday. Make an appointment with a therapist.  If you have not scheduled an appointment by Thursday, I will yell at you (;)).  [facility]  [address], [place], [place] [address]  [phone]  [facility]  Address: [address], [place], [place] [address]  [phone]  [place]. [address], [place], [place] [address] [phone]  Marya Amsler at [facility]  Near-Syncope Near-syncope (commonly known as near fainting) is sudden weakness, dizziness, or feeling like you might pass out. During an episode of near-syncope, you may also develop pale skin, have tunnel vision, or feel sick to your stomach (nauseous). Near-syncope may occur when getting up after sitting or while standing for a long time. It is caused by a sudden decrease in blood flow to the brain. This decrease can result from various causes or triggers, most of which are not serious. However, because near-syncope can sometimes be a sign of something serious, a medical evaluation is required. The specific cause is often not determined. HOME CARE INSTRUCTIONS  Monitor your condition for any changes. The following actions may help to alleviate any discomfort you are experiencing:  Have  someone stay with you until you feel stable.  Lie down right away and prop your feet up if you start feeling like you might faint. Breathe deeply and steadily. Wait until all the symptoms have passed. Most of these episodes last only a few minutes. You may feel tired for several hours.   Drink enough fluids to keep your urine clear or pale yellow.   If you are taking blood pressure or heart medicine, get up slowly when seated or lying down. Take several minutes to sit and then stand. This can reduce dizziness.  Follow up with your health care provider as directed. SEEK IMMEDIATE MEDICAL CARE IF:   You have a severe headache.   You have unusual pain in the chest, abdomen, or back.   You are bleeding from the mouth or rectum, or you have black or tarry stool.   You have an irregular or very fast heartbeat.   You have repeated fainting or have seizure-like jerking during an episode.   You faint when sitting or lying down.   You have confusion.   You have difficulty walking.   You have severe weakness.   You have vision problems.  MAKE SURE YOU:   Understand these instructions.  Will watch your condition.  Will get help right away if you are not doing well or get worse. Document Released: 10/24/2005 Document Revised: 10/29/2013 Document Reviewed: 03/29/2013 [facility] Patient Information 2015 [place], [place]. This information is not intended to replace advice given to you by your health care provider. Make sure you  discuss any questions you have with your health care provider.  Generalized Anxiety Disorder Generalized anxiety disorder (GAD) is a mental disorder. It interferes with life functions, including relationships, work, and school. GAD is different from normal anxiety, which everyone experiences at some point in their lives in response to specific life events and activities. Normal anxiety actually helps [place] prepare for and get through these life events and  activities. Normal anxiety goes away after the event or activity is over.  GAD causes anxiety that is not necessarily related to specific events or activities. It also causes excess anxiety in proportion to specific events or activities. The anxiety associated with GAD is also difficult to control. GAD can vary from mild to severe. People with severe GAD can have intense waves of anxiety with physical symptoms (panic attacks).  SYMPTOMS The anxiety and worry associated with GAD are difficult to control. This anxiety and worry are related to many life events and activities and also occur more days than not for 6 months or longer. People with GAD also have three or more of the following symptoms (one or more in children):  Restlessness.   Fatigue.  Difficulty concentrating.   Irritability.  Muscle tension.  Difficulty sleeping or unsatisfying sleep. DIAGNOSIS GAD is diagnosed through an assessment by your health care provider. Your health care provider will ask you questions aboutyour mood,physical symptoms, and events in your life. Your health care provider may ask you about your medical history and use of alcohol or drugs, including prescription medicines. Your health care provider may also do a physical exam and blood tests. Certain medical conditions and the use of certain substances can cause symptoms similar to those associated with GAD. Your health care provider may refer you to a mental health specialist for further evaluation. TREATMENT The following therapies are usually used to treat GAD:   Medication. Antidepressant medication usually is prescribed for long-term daily control. Antianxiety medicines may be added in severe cases, especially when panic attacks occur.   Talk therapy (psychotherapy). Certain types of talk therapy can be helpful in treating GAD by providing support, education, and guidance. A form of talk therapy called cognitive behavioral therapy can teach you  healthy ways to think about and react to daily life events and activities.  Stress managementtechniques. These include yoga, meditation, and exercise and can be very helpful when they are practiced regularly. A mental health specialist can help determine which treatment is best for you. Some people see improvement with one therapy. However, other people require a combination of therapies. Document Released: 02/18/2013 Document Revised: 03/10/2014 Document Reviewed: 02/18/2013 [facility] Patient Information 2015 [place], [place]. This information is not intended to replace advice given to you by your health care provider. Make sure you discuss any questions you have with your health care provider. Panic Attacks Panic attacks are sudden, short-livedsurges of severe anxiety, fear, or discomfort. They may occur for no reason when you are relaxed, when you are anxious, or when you are sleeping. Panic attacks may occur for a number of reasons:   Healthy people occasionally have panic attacks in extreme, life-threatening situations, such as war or natural disasters. Normal anxiety is a protective mechanism of the body that helps [place] react to danger (fight or flight response).  Panic attacks are often seen with anxiety disorders, such as panic disorder, social anxiety disorder, generalized anxiety disorder, and phobias. Anxiety disorders cause excessive or uncontrollable anxiety. They may interfere with your relationships or other life activities.  Panic  attacks are sometimes seen with other mental illnesses, such as depression and posttraumatic stress disorder.  Certain medical conditions, prescription medicines, and drugs of abuse can cause panic attacks. SYMPTOMS  Panic attacks start suddenly, peak within 20 minutes, and are accompanied by four or more of the following symptoms:  Pounding heart or fast heart rate (palpitations).  Sweating.  Trembling or shaking.  Shortness of breath or feeling  smothered.  Feeling choked.  Chest pain or discomfort.  Nausea or strange feeling in your stomach.  Dizziness, light-headedness, or feeling like you will faint.  Chills or hot flushes.  Numbness or tingling in your lips or hands and feet.  Feeling that things are not real or feeling that you are not yourself.  Fear of losing control or going crazy.  Fear of dying. Some of these symptoms can mimic serious medical conditions. For example, you may think you are having a heart attack. Although panic attacks can be very scary, they are not life threatening. DIAGNOSIS  Panic attacks are diagnosed through an assessment by your health care provider. Your health care provider will ask questions about your symptoms, such as where and when they occurred. Your health care provider will also ask about your medical history and use of alcohol and drugs, including prescription medicines. Your health care provider may order blood tests or other studies to rule out a serious medical condition. Your health care provider may refer you to a mental health professional for further evaluation. TREATMENT   Most healthy people who have one or two panic attacks in an extreme, life-threatening situation will not require treatment.  The treatment for panic attacks associated with anxiety disorders or other mental illness typically involves counseling with a mental health professional, medicine, or a combination of both. Your health care provider will help determine what treatment is best for you.  Panic attacks due to physical illness usually go away with treatment of the illness. If prescription medicine is causing panic attacks, talk with your health care provider about stopping the medicine, decreasing the dose, or substituting another medicine.  Panic attacks due to alcohol or drug abuse go away with abstinence. Some adults need professional help in order to stop drinking or using drugs. HOME CARE INSTRUCTIONS    Take all medicines as directed by your health care provider.   Schedule and attend follow-up visits as directed by your health care provider. It is important to keep all your appointments. SEEK MEDICAL CARE IF:  You are not able to take your medicines as prescribed.  Your symptoms do not improve or get worse. SEEK IMMEDIATE MEDICAL CARE IF:   You experience panic attack symptoms that are different than your usual symptoms.  You have serious thoughts about hurting yourself or others.  You are taking medicine for panic attacks and have a serious side effect. MAKE SURE YOU:  Understand these instructions.  Will watch your condition.  Will get help right away if you are not doing well or get worse. Document Released: 10/24/2005 Document Revised: 10/29/2013 Document Reviewed: 06/07/2013 [facility] Patient Information 2015 [place], [place]. This information is not intended to replace advice given to you by your health care provider. Make sure you discuss any questions you have with your health care provider.

## 2014-09-14 ENCOUNTER — Telehealth: Payer: Self-pay

## 2014-09-14 NOTE — Telephone Encounter (Signed)
The patient called regarding questions regarding medication and FMLA issues. The patient also stated that she needs to be out of work for a longer period of time; but cannot be out this coming week, but states she believes she will need a longer period of time out of work as with short term disability.  The patient requests return call at [phone] to discuss this with Dr. Brigitte Pulse

## 2014-09-15 LAB — T4, FREE: Free T4: 1.37 ng/dL (ref 0.80–1.80)

## 2014-09-16 ENCOUNTER — Encounter (HOSPITAL_COMMUNITY): Payer: Self-pay | Admitting: Emergency Medicine

## 2014-09-16 ENCOUNTER — Emergency Department (HOSPITAL_COMMUNITY)
Admission: EM | Admit: 2014-09-16 | Discharge: 2014-09-17 | Disposition: A | Discharge status: Home / Self Care | Payer: 59 | Attending: Emergency Medicine | Admitting: Emergency Medicine

## 2014-09-16 DIAGNOSIS — Z880 Allergy status to penicillin: Secondary | ICD-10-CM | POA: Diagnosis not present

## 2014-09-16 DIAGNOSIS — Z7951 Long term (current) use of inhaled steroids: Secondary | ICD-10-CM | POA: Diagnosis not present

## 2014-09-16 DIAGNOSIS — R42 Dizziness and giddiness: Secondary | ICD-10-CM

## 2014-09-16 DIAGNOSIS — Z791 Long term (current) use of non-steroidal anti-inflammatories (NSAID): Secondary | ICD-10-CM | POA: Insufficient documentation

## 2014-09-16 DIAGNOSIS — R000 Tachycardia, unspecified: Secondary | ICD-10-CM | POA: Diagnosis not present

## 2014-09-16 DIAGNOSIS — Z8719 Personal history of other diseases of the digestive system: Secondary | ICD-10-CM | POA: Diagnosis not present

## 2014-09-16 DIAGNOSIS — Z79899 Other long term (current) drug therapy: Secondary | ICD-10-CM | POA: Diagnosis not present

## 2014-09-16 DIAGNOSIS — I10 Essential (primary) hypertension: Secondary | ICD-10-CM | POA: Diagnosis not present

## 2014-09-16 DIAGNOSIS — R002 Palpitations: Secondary | ICD-10-CM | POA: Diagnosis present

## 2014-09-16 DIAGNOSIS — Z87891 Personal history of nicotine dependence: Secondary | ICD-10-CM | POA: Insufficient documentation

## 2014-09-16 NOTE — Telephone Encounter (Signed)
Please return call. Patient called to speak with someone.

## 2014-09-16 NOTE — Telephone Encounter (Signed)
Clld pt - advsd of Dr. Raul Del schedule for today, Tuesday, Thursday, 11/12 and Friday, 11/13- am/pm appt only. Pt stated she will try to come in on Thursday, 11/12 or call to schedule appt on Friday. Pt has a question regarding Propanolol, states diastolic has been running in the 80/90s and concerned whether or not she should be taking the medication still. Pt states her BP readings today thus far has been 111/90 74 and 127/90 80. Pt states when she was beginning to exercise, her HR increased(sky rocketed). Reviewed with Dr. Brigitte Pulse  - advsd medication is prescribed for symptom management; Heart racing and anxiety. Dr. Brigitte Pulse is more concerned with systolic than diastolic; pt can hold medication if systolic is <276 as instructed. Also, remind pt that Dr. Brigitte Pulse never received her FMLA paperwork and to make sure to bring the papers with her on Thursday, 11/12. Tried calling pt back - [place] regarding the medication, FMLA paperwork and appts are booked on Friday 11/13.

## 2014-09-16 NOTE — ED Notes (Signed)
MD at bedside. 

## 2014-09-17 ENCOUNTER — Telehealth: Payer: Self-pay | Admitting: Family Medicine

## 2014-09-17 ENCOUNTER — Ambulatory Visit: Payer: 59

## 2014-09-17 ENCOUNTER — Encounter: Payer: Self-pay | Admitting: Family Medicine

## 2014-09-17 ENCOUNTER — Ambulatory Visit (INDEPENDENT_AMBULATORY_CARE_PROVIDER_SITE_OTHER): Payer: 59

## 2014-09-17 ENCOUNTER — Ambulatory Visit: Payer: 59 | Admitting: Cardiovascular Disease

## 2014-09-17 DIAGNOSIS — R000 Tachycardia, unspecified: Secondary | ICD-10-CM

## 2014-09-17 DIAGNOSIS — R42 Dizziness and giddiness: Secondary | ICD-10-CM

## 2014-09-17 LAB — METANEPHRINES, PLASMA
Metanephrine, Free: 31 pg/mL (ref <=57)
Normetanephrine, Free: 78 pg/mL (ref <=148)
Total Metanephrines-Plasma: 109 pg/mL (ref <=205)

## 2014-09-17 NOTE — Telephone Encounter (Signed)
FMLA paperwork has been faxed over from [facility] and placed in Dr. Raul Del box on 09/17/2014. Awaiting completion in 5-7 business days.

## 2014-09-17 NOTE — Discharge Instructions (Signed)
Please continue with your ongoing work up to get to the bottom of your symptoms.  Your exam, ekg, and vitals were normal in the ER tonight.  Take the Ativan and Propranolol as prescribed to help with symptoms.     Cardiac Event Monitoring A cardiac event monitor is a small recording device used to help detect abnormal heart rhythms (arrhythmias). The monitor is used to record heart rhythm when noticeable symptoms such as the following occur:  Fast heartbeats (palpitations), such as heart racing or fluttering.  Dizziness.  Fainting or light-headedness.  Unexplained weakness. The monitor is wired to two electrodes placed on your chest. Electrodes are flat, sticky disks that attach to your skin. The monitor can be worn for up to 30 days. You will wear the monitor at all times, except when bathing.  HOW TO USE YOUR CARDIAC EVENT MONITOR A technician will prepare your chest for the electrode placement. The technician will show you how to place the electrodes, how to work the monitor, and how to replace the batteries. Take time to practice using the monitor before you leave the office. Make sure you understand how to send the information from the monitor to your health care provider. This requires a telephone with a landline, not a cell phone. You need to:  Wear your monitor at all times, except when you are in water:  Do not get the monitor wet.  Take the monitor off when bathing. Do not swim or use a hot tub with it on.  Keep your skin clean. Do not put body lotion or moisturizer on your chest.  Change the electrodes daily or any time they stop sticking to your skin. You might need to use tape to keep them on.  It is possible that your skin under the electrodes could become irritated. To keep this from happening, try to put the electrodes in slightly different places on your chest. However, they must remain in the area under your left breast and in the upper right section of your  chest.  Make sure the monitor is safely clipped to your clothing or in a location close to your body that your health care provider recommends.  Press the button to record when you feel symptoms of heart trouble, such as dizziness, weakness, light-headedness, palpitations, thumping, shortness of breath, unexplained weakness, or a fluttering or racing heart. The monitor is always on and records what happened slightly before you pressed the button, so do not worry about being too late to get good information.  Keep a diary of your activities, such as walking, doing chores, and taking medicine. It is especially important to note what you were doing when you pushed the button to record your symptoms. This will help your health care provider determine what might be contributing to your symptoms. The information stored in your monitor will be reviewed by your health care provider alongside your diary entries.  Send the recorded information as recommended by your health care provider. It is important to understand that it will take some time for your health care provider to process the results.  Change the batteries as recommended by your health care provider. SEEK IMMEDIATE MEDICAL CARE IF:   You have chest pain.  You have extreme difficulty breathing or shortness of breath.  You develop a very fast heartbeat that persists.  You develop dizziness that does not go away.  You faint or constantly feel you are about to faint. Document Released: 08/02/2008 Document Revised: 03/10/2014 Document  Reviewed: 04/22/2013 Exit[employer] Patient Information 2015 [place], [place]. This information is not intended to replace advice given to you by your health care provider. Make sure you discuss any questions you have with your health care provider.  Nonspecific Tachycardia Tachycardia is a faster than normal heartbeat (more than 100 beats per minute). In adults, the heart normally beats between 60 and 100 times a  minute. A fast heartbeat may be a normal response to exercise or stress. It does not necessarily mean that something is wrong. However, sometimes when your heart beats too fast it may not be able to pump enough blood to the rest of your body. This can result in chest pain, shortness of breath, dizziness, and even fainting. Nonspecific tachycardia means that the specific cause or pattern of your tachycardia is unknown. CAUSES  Tachycardia may be harmless or it may be due to a more serious underlying cause. Possible causes of tachycardia include:  Exercise or exertion.  Fever.  Pain or injury.  Infection.  Loss of body fluids (dehydration).  Overactive thyroid.  Lack of red blood cells (anemia).  Anxiety and stress.  Alcohol.  Caffeine.  Tobacco products.  Diet pills.  Illegal drugs.  Heart disease. SYMPTOMS  Rapid or irregular heartbeat (palpitations).  Suddenly feeling your heart beating (cardiac awareness).  Dizziness.  Tiredness (fatigue).  Shortness of breath.  Chest pain.  Nausea.  Fainting. DIAGNOSIS  Your caregiver will perform a physical exam and take your medical history. In some cases, a heart specialist (cardiologist) may be consulted. Your caregiver may also order:  Blood tests.  Electrocardiography. This test records the electrical activity of your heart.  A heart monitoring test. TREATMENT  Treatment will depend on the likely cause of your tachycardia. The goal is to treat the underlying cause of your tachycardia. Treatment methods may include:  Replacement of fluids or blood through an intravenous (IV) tube for moderate to severe dehydration or anemia.  New medicines or changes in your current medicines.  Diet and lifestyle changes.  Treatment for certain infections.  Stress relief or relaxation methods. HOME CARE INSTRUCTIONS   Rest.  Drink enough fluids to keep your urine clear or pale yellow.  Do not  smoke.  Avoid:  Caffeine.  Tobacco.  Alcohol.  Chocolate.  Stimulants such as over-the-counter diet pills or pills that help you stay awake.  Situations that cause anxiety or stress.  Illegal drugs such as marijuana, phencyclidine (PCP), and cocaine.  Only take medicine as directed by your caregiver.  Keep all follow-up appointments as directed by your caregiver. SEEK IMMEDIATE MEDICAL CARE IF:   You have pain in your chest, upper arms, jaw, or neck.  You become weak, dizzy, or feel faint.  You have palpitations that will not go away.  You vomit, have diarrhea, or pass blood in your stool.  Your skin is cool, pale, and wet.  You have a fever that will not go away with rest, fluids, and medicine. MAKE SURE YOU:   Understand these instructions.  Will watch your condition.  Will get help right away if you are not doing well or get worse. Document Released: 12/01/2004 Document Revised: 01/16/2012 Document Reviewed: 10/04/2011 [facility] Patient Information 2015 [place], [place]. This information is not intended to replace advice given to you by your health care provider. Make sure you discuss any questions you have with your health care provider.

## 2014-09-17 NOTE — ED Provider Notes (Signed)
CSN: [id number]     Arrival date & time 09/16/14  2235 History   First MD Initiated Contact with Patient 09/16/14 2304     Chief Complaint  Patient presents with  . Palpitations     (Consider location/radiation/quality/duration/timing/severity/associated sxs/prior Treatment) HPI 38 year old female presents to the emergency room from home with complaint of dizziness fast heart rate fuzzy feeling.  Patient has had multiple ED visits over the last month, currently undergoing workup with cardiology with 21 day event monitor, neurology with MRI scheduled tomorrow with EEG.  Patient has been given propanolol for her symptoms, but was told not to take it if her systolic was less than 6:62.  Patient reports that daily she feels dizzy, foggy feeling.  She has multiple episodes of fast heart rate during the day.  She reports that Ativan does not seem to be helping the symptoms very much.  She reports that deep breathing sometimes can resolve her symptoms.  Patient had labs done at her last ED visit testing for pheochromocytoma, labs are still pending.  Patient took propanolol in the parking lot just prior to coming in the front door as her symptoms were not improving, initial heart rate was elevated, since has improved.  Patient reports blood pressures tonight have been 947M to 546T systolic.  She denies any chest pain. Past Medical History  Diagnosis Date  . Hypertension   . GERD (gastroesophageal reflux disease)   . Tachycardia    Past Surgical History  Procedure Laterality Date  . Wisdom tooth extraction     Family History  Problem Relation Age of Onset  . Seizures Mother   . Cancer Father     Liver  . Diabetes Father 74  . Hypertension Father   . Heart disease Father 42    CEA, LE [place]  . Alzheimer's disease Maternal Grandmother   . Heart disease Maternal Grandfather    History  Substance Use Topics  . Smoking status: Former Smoker    Types: Cigarettes  . Smokeless tobacco: Never  Used     Comment: Quit smoking last month  . Alcohol Use: 0.0 oz/week    0 Not specified per week     Comment: Usually 1 time a month   OB History    Gravida Para Term Preterm AB TAB SAB Ectopic Multiple Living   2 0 0 0 2 1 1 0 0 0      Review of Systems   See History of Present Illness; otherwise all other systems are reviewed and negative  Allergies  Penicillins and Tamiflu  Home Medications   Prior to Admission medications   Medication Sig Start Date End Date Taking? Authorizing Provider  albuterol (PROVENTIL HFA;VENTOLIN HFA) 108 (90 BASE) MCG/ACT inhaler Inhale 2 puffs into the lungs every 6 (six) hours as needed for wheezing or shortness of breath.   Yes Historical Provider, MD  cyclobenzaprine (FLEXERIL) 10 MG tablet Take 10 mg by mouth daily as needed for muscle spasms.   Yes Historical Provider, MD  LORazepam (ATIVAN) 0.5 MG tablet Take 0.25 mg by mouth every 8 (eight) hours as needed for anxiety or sleep.    Yes Historical Provider, MD  meclizine (ANTIVERT) 25 MG tablet Take 1 tablet (25 mg total) by mouth 3 (three) times daily as needed for dizziness. Patient taking differently: Take 25 mg by mouth 3 (three) times daily as needed for dizziness.  09/09/14  Yes Mancel Bale, PA-C  Multiple Vitamins-Minerals (HAIR/SKIN/NAILS PO) Take 2 tablets by  mouth daily.   Yes Historical Provider, MD  propranolol (INDERAL) 40 MG tablet Take 1 tablet (40 mg total) by mouth 3 (three) times daily as needed (anxiety). 09/10/14  Yes [name], MD  Vitamin D, Ergocalciferol, (DRISDOL) 50000 UNITS CAPS capsule Take 50,000 Units by mouth every 7 (seven) days. Monday 07/30/14  Yes Historical Provider, MD  acetaminophen (TYLENOL) 500 MG tablet Take 1,000 mg by mouth every 6 (six) hours as needed for mild pain.    Historical Provider, MD  ALPRAZolam (NIRAVAM) 0.5 MG dissolvable tablet Take 1-2 tablets (0.5-1 mg total) by mouth 3 (three) times daily as needed (panic attack). Patient not taking:  Reported on 09/16/2014 08/26/14   [name], MD  atenolol (TENORMIN) 50 MG tablet Take 1 tablet (50 mg total) by mouth daily. 09/09/14   Mancel Bale, PA-C  clonazePAM (KLONOPIN) 0.5 MG tablet Take 0.5-1 tablets (0.25-0.5 mg total) by mouth 2 (two) times daily as needed for anxiety. Patient not taking: Reported on 09/16/2014 09/09/14   Mancel Bale, PA-C  fluticasone [facility]) 50 MCG/ACT nasal spray Place 1 spray into both nostrils daily as needed for allergies or rhinitis.    Historical Provider, MD  meloxicam (MOBIC) 15 MG tablet Take 1 tablet (15 mg total) by mouth daily. Patient not taking: Reported on 09/16/2014 08/04/14   [name], MD  potassium chloride (K-DUR) 10 MEQ tablet Take 1 tablet (10 mEq total) by mouth daily. Patient not taking: Reported on 09/16/2014 08/24/14   Hoy Morn, MD   BP 130/64 mmHg  Pulse 75  Temp(Src) 97.5 F (36.4 C) (Oral)  Resp 34  SpO2 100%  LMP 09/09/2014 Physical Exam  Constitutional: She is oriented to person, place, and time. She appears well-developed and well-nourished. She appears distressed (anxious, tearful).  HENT:  Head: Normocephalic and atraumatic.  Nose: Nose normal.  Mouth/Throat: Oropharynx is clear and moist.  Eyes: Conjunctivae and EOM are normal. Pupils are equal, round, and reactive to light.  Neck: Normal range of motion. Neck supple. No JVD present. No tracheal deviation present. No thyromegaly present.  Cardiovascular: Normal rate, regular rhythm, normal heart sounds and intact distal pulses.  Exam reveals no gallop and no friction rub.   No murmur heard. Pulmonary/Chest: Effort normal and breath sounds normal. No stridor. No respiratory distress. She has no wheezes. She has no rales. She exhibits no tenderness.  Abdominal: Soft. Bowel sounds are normal. She exhibits no distension and no mass. There is no tenderness. There is no rebound and no guarding.  Musculoskeletal: Normal range of motion. She exhibits no edema or  tenderness.  Lymphadenopathy:    She has no cervical adenopathy.  Neurological: She is alert and oriented to person, place, and time. She displays normal reflexes. She exhibits normal muscle tone. Coordination normal.  Skin: Skin is warm and dry. No rash noted. No erythema. No pallor.  Psychiatric: She has a normal mood and affect. Her behavior is normal. Judgment and thought content normal.  Nursing note and vitals reviewed.   ED Course  Procedures (including critical care time) Labs Review Labs Reviewed - No data to display  Imaging Review No results found.   EKG Interpretation   Date/Time:  Tuesday September 16 2014 23:05:08 EST Ventricular Rate:  83 PR Interval:  173 QRS Duration: 94 QT Interval:  380 QTC Calculation: 446 R Axis:   23 Text Interpretation:  Sinus rhythm Probable left atrial enlargement Low  voltage, precordial leads No significant change  since last tracing  Confirmed by Rajanee Schuelke  MD, Lorne Winkels ([id number]) on 09/16/2014 11:13:13 PM      MDM   Final diagnoses:  Tachycardia  Dizziness and giddiness    38 year old female with ongoing episodes of tachycardia, anxiety.  She is seeing multiple specialists for the workup.  She is on appropriate medications.  I do not feel further blood work at this time is indicated.  Her vitals have normalized.  She is tearful and anxious about her over all condition, but it sounds that workup is ongoing.    Kalman Drape, MD 09/17/14 [phone]

## 2014-09-17 NOTE — Telephone Encounter (Signed)
Pt clld back - advsd of Dr.Shaw's instructions. Pt stated she did end up in the ER last night because of elevated BP and heart racing. She stated she only took half of the Propanolol but her anxiety got the best of her and she was scared. She stated that she is just unsure of the medication. Advsd to make sure to discuss with Dr. Brigitte Pulse on Thursday.  Also, advsd pt that her FMLA paperwork were never received. Advsd to have paperwork faxed to [phone] to my attention so that I can make sure Dr. Brigitte Pulse receives the documents before her OV tomorrow. Pt stated she would call her employer again and have paperwork faxed.

## 2014-09-18 ENCOUNTER — Telehealth: Payer: Self-pay | Admitting: *Deleted

## 2014-09-18 ENCOUNTER — Ambulatory Visit (INDEPENDENT_AMBULATORY_CARE_PROVIDER_SITE_OTHER): Payer: 59 | Admitting: Family Medicine

## 2014-09-18 ENCOUNTER — Ambulatory Visit (INDEPENDENT_AMBULATORY_CARE_PROVIDER_SITE_OTHER): Payer: 59 | Admitting: Physician Assistant

## 2014-09-18 ENCOUNTER — Other Ambulatory Visit: Payer: Self-pay | Admitting: Physician Assistant

## 2014-09-18 VITALS — BP 132/83 | HR 69 | Temp 98.2°F | Resp 17 | Ht 63.0 in | Wt 212.0 lb

## 2014-09-18 VITALS — BP 118/82 | HR 64 | Temp 98.3°F | Resp 16 | Ht 63.0 in | Wt 213.4 lb

## 2014-09-18 DIAGNOSIS — R42 Dizziness and giddiness: Secondary | ICD-10-CM

## 2014-09-18 DIAGNOSIS — R0981 Nasal congestion: Secondary | ICD-10-CM

## 2014-09-18 DIAGNOSIS — F410 Panic disorder [episodic paroxysmal anxiety] without agoraphobia: Secondary | ICD-10-CM

## 2014-09-18 DIAGNOSIS — F4322 Adjustment disorder with anxiety: Secondary | ICD-10-CM

## 2014-09-18 MED ORDER — BUSPIRONE HCL 5 MG PO TABS: 5.0000 mg | ORAL_TABLET | Freq: Two times a day (BID) | ORAL | Status: DC

## 2014-09-18 NOTE — Progress Notes (Addendum)
Subjective:    Patient ID: Brooke Turner, female    DOB: 1976-04-10, 38 y.o.   MRN: [id number] This chart was scribed for [name], MD by Marti Sleigh, Medical Scribe. This patient was seen in Room 12 and the patient's care was started a 9:24 AM.  Chief Complaint  Patient presents with  . Follow-up    HPI Past Medical History  Diagnosis Date  . Hypertension   . GERD (gastroesophageal reflux disease)   . Tachycardia    Allergies  Allergen Reactions  . Penicillins Hives  . Tamiflu [Oseltamivir Phosphate] Nausea And Vomiting   Current Outpatient Prescriptions on File Prior to Visit  Medication Sig Dispense Refill  . acetaminophen (TYLENOL) 500 MG tablet Take 1,000 mg by mouth every 6 (six) hours as needed for mild pain.    Marland Kitchen albuterol (PROVENTIL HFA;VENTOLIN HFA) 108 (90 BASE) MCG/ACT inhaler Inhale 2 puffs into the lungs every 6 (six) hours as needed for wheezing or shortness of breath.    . ALPRAZolam (NIRAVAM) 0.5 MG dissolvable tablet Take 1-2 tablets (0.5-1 mg total) by mouth 3 (three) times daily as needed (panic attack). 30 tablet 0  . atenolol (TENORMIN) 50 MG tablet Take 1 tablet (50 mg total) by mouth daily. 30 tablet 0  . clonazePAM (KLONOPIN) 0.5 MG tablet Take 0.5-1 tablets (0.25-0.5 mg total) by mouth 2 (two) times daily as needed for anxiety. 20 tablet 0  . cyclobenzaprine (FLEXERIL) 10 MG tablet Take 10 mg by mouth daily as needed for muscle spasms.    . fluticasone (FLONASE) 50 MCG/ACT nasal spray Place 1 spray into both nostrils daily as needed for allergies or rhinitis.    Marland Kitchen LORazepam (ATIVAN) 0.5 MG tablet Take 0.25 mg by mouth every 8 (eight) hours as needed for anxiety or sleep.     . meclizine (ANTIVERT) 25 MG tablet Take 1 tablet (25 mg total) by mouth 3 (three) times daily as needed for dizziness. (Patient taking differently: Take 25 mg by mouth 3 (three) times daily as needed for dizziness. ) 21 tablet 0  . meloxicam (MOBIC) 15 MG tablet Take 1  tablet (15 mg total) by mouth daily. 30 tablet 0  . Multiple Vitamins-Minerals (HAIR/SKIN/NAILS PO) Take 2 tablets by mouth daily.    . potassium chloride (K-DUR) 10 MEQ tablet Take 1 tablet (10 mEq total) by mouth daily. 7 tablet 0  . propranolol (INDERAL) 40 MG tablet Take 1 tablet (40 mg total) by mouth 3 (three) times daily as needed (anxiety). 60 tablet 0  . Vitamin D, Ergocalciferol, (DRISDOL) 50000 UNITS CAPS capsule Take 50,000 Units by mouth every 7 (seven) days. Monday  0   No current facility-administered medications on file prior to visit.   HPI Comments: Brooke Turner is a 38 y.o. female with a past hx of anxiety disorder who presents to [facility] complaining of continued anxiety. Pt stated that her heart rate and pulse is concerning her. Pt states that her diastolic has been near 989 regularly. Pt states she thinks her pulse has been high. Pt states she has been feeling spacey, and unfocussed. Pt states she feels like she is not here, and has trouble remembering what recently happened to her. Pt states she is worried that her brain is slowly dying. She is worried her brain is telling her body to slowly die. Pt has booked an appointment with a psycologist. Pt stated that she has been experiencing extreme anxiety and mild dizziness when she considers taking a  shower. She has started taking sponge baths, instead of taking showers because she feels so anxious in the shower. Pt states her primary concern is her dizziness. When she is overwhelmed by something, she feels she can't do it and becomes extremely dizzy. She states she feels overwhelmed all the time, but it gets worse at particular points. Pt states she feels foggy all day. Pt states her vision is different and she thinks that her eyes are dilated. Pt states she is concerned that she has mold in her house that might be making her feel bad. Pt states she taking her lorazepam as needed, but that is the only medication she is taking. Pt  states she has slowed down. Pt states she believes she needs a month off of work to go home to [place], [place], and spend time with her mother.  Pt has a extensive hx of palpitations, diaphoreses, dyspnea, pre-syncope, since 08/24/2014 when a friend gave her several tequila shots. She has concerns that they may have been laced. Pt was seen in ER that day, and had a normal workup. She was seen in office visit several days later, and symptoms seemed to be consistent with panic attacks. As she has had several recent life stressors, including death of longtime pet and family member. Pt has now had 5 ER visits for these symptoms. Pt has also visited [facility] four times, in addition to seeing a cardiologist, and a neurologist. She is currently wearing a 21 day event monitor. She had two chest x-rays and a head CT which were both normal. She had an MRI of her brain and an EEG, which are both pending. She also had numerous EKGs which were normal, as well as numerous labs which were all normal. Pt's atenolol, and lisinopril were stopped due to concerns of hypotension as etiology of symptoms. She has been recommended to try clonopin, lorazepam, and xanax for sx. Dr Brigitte Pulse discussed starting pt on citalopram or buspar for anxiety but she has been reluctant to take them. She was tried on meclozine for dizzyness without relief. She was started on as needed propranolol, with some relief of her fast heart rate. Pt has been checking her blood sugar and BP very frequently, and becomes very concerned over minor changes and abnormalities that should cause no physiological changes. Pt was advised to take one week off of work in order to focus on increasing sleep and exercise and schedule therapy visits but she has been unable to do this. Pt was advised to not monitor BP or blood sugar any more than 3 times per day. Pt was recommended to do it at scheduled times instead of as needed and keep log. Pt requested FMLA paperwork for her sx.  Since Dr Brigitte Pulse saw pt five days ago, and advised to start on meditation, yoga and exercise, she has been to ER twice, the last time being last night. At ER last night she was complaining of palpitations and dizziness, pt took propranolol with improvement of sx. EKG was normal, no other workup or medications given. Pt has an office visit scheduled with neuro in less than two weeks, and a visit with cardio in one month.    Review of Systems  Constitutional: Negative for fever and chills.  Respiratory: Negative for cough and shortness of breath.   Cardiovascular: Negative for chest pain.  Gastrointestinal: Negative for nausea and vomiting.  Musculoskeletal: Negative for myalgias and arthralgias.  Neurological: Positive for syncope (Pre).  Psychiatric/Behavioral: Positive for agitation.  The patient is nervous/anxious.        Objective:  BP 130/80 mmHg  Pulse 78  Temp(Src) 98.2 F (36.8 C) (Oral)  Resp 17  Ht 5\' 3"  (1.6 m)  Wt 212 lb (96.163 kg)  BMI 37.56 kg/m2  SpO2 98%  LMP 09/09/2014  Physical Exam  Constitutional: She is oriented to person, place, and time. She appears well-developed and well-nourished.  Teary  HENT:  Head: Normocephalic and atraumatic.  Right Ear: Tympanic membrane and ear canal normal.  Left Ear: Tympanic membrane and ear canal normal.  Nose: Mucosal edema and rhinorrhea present.  Mouth/Throat: Oropharynx is clear and moist.  Eyes: Pupils are equal, round, and reactive to light.  Neck: Neck supple.  Cardiovascular: Normal rate, regular rhythm and normal heart sounds.   Pulmonary/Chest: Effort normal and breath sounds normal. No respiratory distress. She has no wheezes.  Neurological: She is alert and oriented to person, place, and time.  Skin: Skin is warm and dry.  Psychiatric: Cognition and memory are normal. She expresses no suicidal ideation. She expresses no suicidal plans.  Nursing note and vitals reviewed.  Orthostatics borderline.       Assessment & Plan:   FMLA paperwork completed for intermittent leave due to sxs and their evaluation - copy scanned.  Recommend pt consider taking a month off on FMLA and go to [place] to stay w/ her parents. I think that a lot of her triggers have been due to being alone at her house - worried she is going to pass out and no one will know.   Over 40 mins spent in face-to-face interaction. Adjustment disorder with anxious mood - start buspar, if side effects or uncontrolled sxs would then proceed w/ trial of citalopram. Start prn propranolol for anxiety and palpitations.  Panic attack - recommended pt discuss need with neurology for PSG due to OSA sxs and cons tilt-table to w/u dizziness.  Also rec pt call neurology in [place] so she can have a consult with them and continue her w/u and eval is she does decide to go to [place] for Dec.  Meds ordered this encounter  Medications  . busPIRone (BUSPAR) 5 MG tablet    Sig: Take 1 tablet (5 mg total) by mouth 2 (two) times daily.    Dispense:  60 tablet    Refill:  0    I personally performed the services described in this documentation, which was scribed in my presence. The recorded information has been reviewed and considered, and addended by me as needed.  [name], MD MPH

## 2014-09-18 NOTE — Telephone Encounter (Signed)
Completed FMLA forms given to patient. Copy made to scan in media section of patient's chart

## 2014-09-18 NOTE — Telephone Encounter (Signed)
Please call patient for normal EEG

## 2014-09-18 NOTE — Patient Instructions (Signed)
Zantac - if the heartburn worsens - it is 150mg  2x/day

## 2014-09-18 NOTE — Progress Notes (Signed)
Subjective:    Patient ID: Brooke Turner, female    DOB: 1975-11-13, 38 y.o.   MRN: [id number]  HPI Pt presents to clinic with concerns about carbon monoxide poisoning.  She has been having problems with dizziness and tonight it is not worse than normal but she is worried about the possibility of carbon monoxide poisoning because they have kerosine heat in her house but she does not use it.  She states the symptoms are worse in the am after she has been home all night but going outside does not really help her dizzy symptoms.  Over the last several days she has noticed she has congestion that is worse in the am and then she started to wonder if the mold in her house has something to do with her current symptoms.  She also has a sensation that her throat is closing at times - it seems worse in the am. She has some PND but that is only the last several days.  She cannot say the globus sensation is worse with her current dizziness and increased heart rate. She has heartburn at times and uses Tums for her symptoms.  She is not sure what her heartburn is related to food wise.  Review of Systems  HENT: Positive for congestion and postnasal drip. Negative for rhinorrhea.   Respiratory: Negative for shortness of breath.   Cardiovascular: Positive for palpitations. Negative for chest pain.  Neurological: Positive for dizziness.       Objective:   Physical Exam  Constitutional: She appears well-developed and well-nourished.  BP 118/82 mmHg  Pulse 64  Temp(Src) 98.3 F (36.8 C) (Oral)  Resp 16  Ht 5\' 3"  (1.6 m)  Wt 213 lb 6.4 oz (96.798 kg)  BMI 37.81 kg/m2  SpO2 100%  LMP 09/09/2014   HENT:  Head: Normocephalic and atraumatic.  Right Ear: Hearing, tympanic membrane, external ear and ear canal normal.  Left Ear: Hearing, tympanic membrane, external ear and ear canal normal.  Nose: Mucosal edema (red) present.  Mouth/Throat: Uvula is midline, oropharynx is clear and moist and mucous  membranes are normal. No posterior oropharyngeal edema.  Cardiovascular: Normal rate, regular rhythm and normal heart sounds.   No murmur heard. Pulmonary/Chest: Effort normal and breath sounds normal. She has no wheezes.  Vitals reviewed.      Assessment & Plan:  Nasal congestion - Plan: Allergy profile region II-DC, [place], [place], [place], [place]  Dizzy spells - Plan: Carboxyhemoglobin   We discussed these are all symptoms of anxiety and she is open to the diagnosis but wants to make sure everything else is normal 1st and she is still thinking about things this could be.  She states her symptoms are no worse tonight than they have been over the last week or so.  We discussed at the test for mold is just to determine if she is allergic and not to determine if there is mold in her house. We will check her labs tonight.  She can keep her window open at night to see if that will help her feel comfortable in her house.  We discussed reflux as a possibility of her globus sensation.   She will continue with TUMs and if it gets worse she will add OTC Zantac - I am hestitent to add more medications because of all the medications changes over the last week.  She will try to be more aware of when her symptoms start and any relationship to anything - certain  foods, times of the day etc.  Windell Hummingbird PA-C  [facility] 09/18/2014 9:40 PM

## 2014-09-18 NOTE — Patient Instructions (Signed)
Near-Syncope Near-syncope (commonly known as near fainting) is sudden weakness, dizziness, or feeling like you might pass out. During an episode of near-syncope, you may also develop pale skin, have tunnel vision, or feel sick to your stomach (nauseous). Near-syncope may occur when getting up after sitting or while standing for a long time. It is caused by a sudden decrease in blood flow to the brain. This decrease can result from various causes or triggers, most of which are not serious. However, because near-syncope can sometimes be a sign of something serious, a medical evaluation is required. The specific cause is often not determined. HOME CARE INSTRUCTIONS  Monitor your condition for any changes. The following actions may help to alleviate any discomfort you are experiencing:  Have someone stay with you until you feel stable.  Lie down right away and prop your feet up if you start feeling like you might faint. Breathe deeply and steadily. Wait until all the symptoms have passed. Most of these episodes last only a few minutes. You may feel tired for several hours.   Drink enough fluids to keep your urine clear or pale yellow.   If you are taking blood pressure or heart medicine, get up slowly when seated or lying down. Take several minutes to sit and then stand. This can reduce dizziness.  Follow up with your health care provider as directed. SEEK IMMEDIATE MEDICAL CARE IF:   You have a severe headache.   You have unusual pain in the chest, abdomen, or back.   You are bleeding from the mouth or rectum, or you have black or tarry stool.   You have an irregular or very fast heartbeat.   You have repeated fainting or have seizure-like jerking during an episode.   You faint when sitting or lying down.   You have confusion.   You have difficulty walking.   You have severe weakness.   You have vision problems.  MAKE SURE YOU:   Understand these instructions.  Will  watch your condition.  Will get help right away if you are not doing well or get worse. Document Released: 10/24/2005 Document Revised: 10/29/2013 Document Reviewed: 03/29/2013 [facility] Patient Information 2015 [place], [place]. This information is not intended to replace advice given to you by your health care provider. Make sure you discuss any questions you have with your health care provider. Orthostatic Hypotension Orthostatic hypotension is a sudden drop in blood pressure. It happens when you quickly stand up from a seated or lying position. You may feel dizzy or light-headed. This can last for just a few seconds or for up to a few minutes. It is usually not a serious problem. However, if this happens frequently or gets worse, it can be a sign of something more serious. CAUSES  Different things can cause orthostatic hypotension, including:   Loss of body fluids (dehydration).  Medicines that lower blood pressure.  Sudden changes in posture, such as standing up quickly after you have been sitting or lying down.  Taking too much of your medicine. SIGNS AND SYMPTOMS   Light-headedness or dizziness.   Fainting or near-fainting.   A fast heart rate.   Weakness.   Feeling tired (fatigue).  DIAGNOSIS  Your health care provider may do several things to help diagnose your condition and identify the cause. These may include:   Taking a medical history and doing a physical exam.  Checking your blood pressure. Your health care provider will check your blood pressure when you are:  Lying down.  Sitting.  Standing.  Using tilt table testing. In this test, you lie down on a table that moves from a lying position to a standing position. You will be strapped onto the table. This test monitors your blood pressure and heart rate when you are in different positions. TREATMENT  Treatment will vary depending on the cause. Possible treatments include:   Changing the dosage of your  medicines.  Wearing compression stockings on your lower legs.  Standing up slowly after sitting or lying down.  Eating more salt.  Eating frequent, small meals.  In some cases, getting IV fluids.  Taking medicine to enhance fluid retention. HOME CARE INSTRUCTIONS  Only take over-the-counter or prescription medicines as directed by your health care provider.  Follow your health care provider's instructions for changing the dosage of your current medicines.  Do not stop or adjust your medicine on your own.  Stand up slowly after sitting or lying down. This allows your body to adjust to the different position.  Wear compression stockings as directed.  Eat extra salt as directed.  Do not add extra salt to your diet unless directed to by your health care provider.  Eat frequent, small meals.  Avoid standing suddenly after eating.  Avoid hot showers or excessive heat as directed by your health care provider.  Keep all follow-up appointments. SEEK MEDICAL CARE IF:  You continue to feel dizzy or light-headed after standing.  You feel groggy or confused.  You feel cold, clammy, or sick to your stomach (nauseous).  You have blurred vision.  You feel short of breath. SEEK IMMEDIATE MEDICAL CARE IF:   You faint after standing.  You have chest pain.  You have difficulty breathing.   You lose feeling or movement in your arms or legs.   You have slurred speech or difficulty talking, or you are unable to talk.  MAKE SURE YOU:   Understand these instructions.  Will watch your condition.  Will get help right away if you are not doing well or get worse. Document Released: 10/14/2002 Document Revised: 10/29/2013 Document Reviewed: 08/16/2013 [facility] Patient Information 2015 [place], [place]. This information is not intended to replace advice given to you by your health care provider. Make sure you discuss any questions you have with your health care  provider. Postural Orthostatic Tachycardia Syndrome Postural orthostatic tachycardia syndrome (POTS) is an increased heart rate when going from a lying (supine) position to a standing position. The heart rate may increase more than 30 beats per minute (BPM) above its resting rate when going from a lying to a standing position. POTS occurs more frequently in women than in men.  SYMPTOMS  POTS symptoms may be increased in the morning. Symptoms of POTS include:  Fainting or near fainting.  Inability to think clearly.  Extreme or chronic fatigue.  Exercise intolerance.  Chest pain.  Having the lower legs develop a reddish-blue color due to decreased blood flow (acrocyanosis). CAUSES POTS can be caused by different conditions. Sometimes, it has no known cause (idiopathic). Some causes of POTS include:  Viral illness.  Pregnancy.  Autoimmune diseases.  Medications.  Major surgery.  Trauma such as a car accident or major injury.  Medical conditions such as anemia, dehydration, and hyperthyroidism. DIAGNOSIS  POTS is diagnosed by:  Taking a complete history and physical exam.  Measuring the heart rate while lying and then upon standing.  Measuring blood pressure when going from a lying to a standing position. POTS is  usually not associated with low blood pressure (orthostatic hypotension) when going from a lying to standing position. While standing, blood pressure should be taken 2, 5, and 10 minutes after getting up. TREATMENT  Treatment of POTS depends upon the severity of the symptoms. Treatment includes:  Drinking plenty of fluids to avoid getting dehydrated.  Avoiding very hot environments to not get overheated.  Increasing your dietary salt intake as instructed by your caregiver.  Taking different types of medications as prescribed for POTS.  Avoiding some classes of medications such as vasodilators and diuretics. SEEK IMMEDIATE MEDICAL CARE IF  You have severe  chest pain that does not go away. Call your local emergency service immediately.  You feel your heart racing or beating rapidly.  You feel like passing out.  You have very confused thinking. MAKE SURE YOU  Understand these instructions.  Will watch your condition.  Will get help right away if you are not doing well or get worse. Document Released: 10/14/2002 Document Revised: 03/10/2014 Document Reviewed: 12/22/2010 [facility] Patient Information 2015 [place], [place]. This information is not intended to replace advice given to you by your health care provider. Make sure you discuss any questions you have with your health care provider.

## 2014-09-18 NOTE — Telephone Encounter (Signed)
Called patient and let her know we received her message Dr.Yan will call her back with Results.

## 2014-09-18 NOTE — Procedures (Signed)
   HISTORY: 38 years old female, complains of headache, dizziness,palpitation  TECHNIQUE:  16 channel EEG was performed based on standard 10-16 international system. One channel was dedicated to EKG, which has demonstrates normal sinus rhythm of 66 beats per minutes.  Upon awakening, the posterior background activity was well-developed, in alpha range11 Hz, reactive to eye opening and closure.  There was no evidence of epileptiform discharge.  Photic stimulation was performed, which induced a symmetric photic driving.  Hyperventilation was performed, there was no abnormality elicit.  Stage II sleep was achieved, as evident by K complexes, sleep spindles  CONCLUSION: This is a  normal awake and asleep EEG.  There is no electrodiagnostic evidence of epileptiform discharge

## 2014-09-19 ENCOUNTER — Telehealth (HOSPITAL_COMMUNITY): Payer: Self-pay | Admitting: *Deleted

## 2014-09-19 ENCOUNTER — Encounter (HOSPITAL_COMMUNITY): Payer: Self-pay | Admitting: *Deleted

## 2014-09-19 ENCOUNTER — Telehealth: Payer: Self-pay | Admitting: Neurology

## 2014-09-19 ENCOUNTER — Emergency Department (HOSPITAL_COMMUNITY): Payer: 59

## 2014-09-19 ENCOUNTER — Other Ambulatory Visit: Payer: Self-pay

## 2014-09-19 ENCOUNTER — Ambulatory Visit: Payer: 59 | Admitting: Cardiovascular Disease

## 2014-09-19 DIAGNOSIS — I10 Essential (primary) hypertension: Secondary | ICD-10-CM | POA: Diagnosis not present

## 2014-09-19 DIAGNOSIS — Z87891 Personal history of nicotine dependence: Secondary | ICD-10-CM | POA: Diagnosis not present

## 2014-09-19 DIAGNOSIS — R079 Chest pain, unspecified: Secondary | ICD-10-CM | POA: Insufficient documentation

## 2014-09-19 DIAGNOSIS — E119 Type 2 diabetes mellitus without complications: Secondary | ICD-10-CM | POA: Diagnosis not present

## 2014-09-19 DIAGNOSIS — Z7951 Long term (current) use of inhaled steroids: Secondary | ICD-10-CM | POA: Diagnosis not present

## 2014-09-19 DIAGNOSIS — Z791 Long term (current) use of non-steroidal anti-inflammatories (NSAID): Secondary | ICD-10-CM | POA: Diagnosis not present

## 2014-09-19 DIAGNOSIS — Z8719 Personal history of other diseases of the digestive system: Secondary | ICD-10-CM | POA: Diagnosis not present

## 2014-09-19 DIAGNOSIS — Z880 Allergy status to penicillin: Secondary | ICD-10-CM | POA: Insufficient documentation

## 2014-09-19 DIAGNOSIS — J351 Hypertrophy of tonsils: Secondary | ICD-10-CM | POA: Insufficient documentation

## 2014-09-19 DIAGNOSIS — R002 Palpitations: Secondary | ICD-10-CM | POA: Diagnosis not present

## 2014-09-19 DIAGNOSIS — Z79899 Other long term (current) drug therapy: Secondary | ICD-10-CM | POA: Insufficient documentation

## 2014-09-19 DIAGNOSIS — F419 Anxiety disorder, unspecified: Secondary | ICD-10-CM | POA: Diagnosis not present

## 2014-09-19 DIAGNOSIS — R0602 Shortness of breath: Secondary | ICD-10-CM | POA: Diagnosis present

## 2014-09-19 LAB — COMPREHENSIVE METABOLIC PANEL
ALT: 15 U/L (ref 0–35)
AST: 18 U/L (ref 0–37)
Albumin: 3.9 g/dL (ref 3.5–5.2)
Alkaline Phosphatase: 71 U/L (ref 39–117)
Anion gap: 16 — ABNORMAL HIGH (ref 5–15)
BUN: 8 mg/dL (ref 6–23)
CO2: 23 mEq/L (ref 19–32)
Calcium: 9.1 mg/dL (ref 8.4–10.5)
Chloride: 102 mEq/L (ref 96–112)
Creatinine, Ser: 0.87 mg/dL (ref 0.50–1.10)
GFR calc Af Amer: >90 mL/min (ref >90)
GFR calc non Af Amer: 83 mL/min — ABNORMAL LOW (ref >90)
Glucose, Bld: 101 mg/dL — ABNORMAL HIGH (ref 70–99)
Potassium: 3.5 mEq/L — ABNORMAL LOW (ref 3.7–5.3)
Sodium: 141 mEq/L (ref 137–147)
Total Bilirubin: 0.4 mg/dL (ref 0.3–1.2)
Total Protein: 7.3 g/dL (ref 6.0–8.3)

## 2014-09-19 LAB — CBC WITH DIFFERENTIAL/PLATELET
Basophils Absolute: 0 10*3/uL (ref 0.0–0.1)
Basophils Relative: 0 % (ref 0–1)
Eosinophils Absolute: 0.1 10*3/uL (ref 0.0–0.7)
Eosinophils Relative: 1 % (ref 0–5)
HCT: 36.5 % (ref 36.0–46.0)
Hemoglobin: 12.8 g/dL (ref 12.0–15.0)
Lymphocytes Relative: 47 % — ABNORMAL HIGH (ref 12–46)
Lymphs Abs: 4.3 10*3/uL — ABNORMAL HIGH (ref 0.7–4.0)
MCH: 30.1 pg (ref 26.0–34.0)
MCHC: 35.1 g/dL (ref 30.0–36.0)
MCV: 85.9 fL (ref 78.0–100.0)
Monocytes Absolute: 0.6 10*3/uL (ref 0.1–1.0)
Monocytes Relative: 7 % (ref 3–12)
Neutro Abs: 4.2 10*3/uL (ref 1.7–7.7)
Neutrophils Relative %: 45 % (ref 43–77)
Platelets: 306 10*3/uL (ref 150–400)
RBC: 4.25 MIL/uL (ref 3.87–5.11)
RDW: 12.4 % (ref 11.5–15.5)
WBC: 9.2 10*3/uL (ref 4.0–10.5)

## 2014-09-19 LAB — I-STAT TROPONIN, ED: Troponin i, poc: 0.01 ng/mL (ref 0.00–0.08)

## 2014-09-19 LAB — LIPASE, BLOOD: Lipase: 22 U/L (ref 11–59)

## 2014-09-19 NOTE — ED Notes (Addendum)
C/o sob, "feel winded even at rest", and "difficult to swallow, so I haven't been drinking fluids and feel dehydrated", onset yesterday, gradually progressively worse, mentions CP earlier today, but denies CP at this time, also mentions dizziness and nausea. (denies: vomiting, fever, cough, congestion, cold sx), "not seen before for the same", LS CTA, tonsils minimally prominent w/o redness or drainage. Last ate 1500. Received flu shot this season. Alert, NAD, calm, interactive, speech clear, no dyspnea noted.

## 2014-09-19 NOTE — Telephone Encounter (Signed)
I called the patient again. The patient wanted the EEG and MRI brain results. The EEG is normal. The MRI results are still pending.

## 2014-09-19 NOTE — Telephone Encounter (Signed)
Called patient left her a message normal EEG results.

## 2014-09-19 NOTE — Telephone Encounter (Signed)
Left message on voicemail to call back (Per Dr Percival Spanish, note from 09/11/14 -he will consider an echo if anymore symptoms but will start with 21 day monitor)

## 2014-09-19 NOTE — Telephone Encounter (Signed)
The patient called. She has had problems with her heart racing, feeling like fainting. I called her, she did not answer, I left a message. I will call back later.

## 2014-09-19 NOTE — Telephone Encounter (Signed)
Pt states that she went to the emergency room because her heart rate went up and she couldn't get it back down. (she is wearing a monitor) The emergency department questioned why an echo was not ordered for her and suggested that she call and have one done.  Please call and advise

## 2014-09-20 ENCOUNTER — Emergency Department (HOSPITAL_COMMUNITY)
Admission: EM | Admit: 2014-09-20 | Discharge: 2014-09-20 | Disposition: A | Discharge status: Home / Self Care | Payer: 59 | Attending: Emergency Medicine | Admitting: Emergency Medicine

## 2014-09-20 DIAGNOSIS — F419 Anxiety disorder, unspecified: Secondary | ICD-10-CM

## 2014-09-20 DIAGNOSIS — R0602 Shortness of breath: Secondary | ICD-10-CM

## 2014-09-20 DIAGNOSIS — J351 Hypertrophy of tonsils: Secondary | ICD-10-CM

## 2014-09-20 HISTORY — DX: Type 2 diabetes mellitus without complications: E11.9

## 2014-09-20 MED ORDER — LORATADINE 10 MG PO TABS: 10.0000 mg | ORAL_TABLET | Freq: Every day | ORAL | Status: DC

## 2014-09-20 NOTE — ED Provider Notes (Signed)
CSN: [id number]     Arrival date & time 09/19/14  2145 History   First MD Initiated Contact with Patient 09/20/14 0115     Chief Complaint  Patient presents with  . Dysphagia  . Shortness of Breath     (Consider location/radiation/quality/duration/timing/severity/associated sxs/prior Treatment) HPI Patient has been seen 4 times this month in the emergency department and several times by her primary doctor for similar complaints. She complains of episodic palpitations, shortness of breath, lightheadedness. She's had extensive workup including evaluation by neurology and cardiology. She's had a normal EEG and MRI of her brain. She currently is wearing a Holter monitor. Her primary doctor recently prescribed BuSpar for what she considers to be anxiety related symptoms. Patient states she is yet to take this. Patient states that over the last day she's had mild throat swelling and discomfort with swallowing. Patient is able to swallow liquids. She talked tolerating her secretions. She's had no vomiting. Patient admits to increased stress. Recent family member and pet death. She lives alone. She is afraid that she will pass out normal find her. She takes her blood pressure very often throughout the day concerned that it will be elevated.she currently is having no pain. No lightheadedness or shortness of breath. She had mild stabbing chest pain in the emergency department lobby that lasted only a few seconds and then resolved. There was no radiation. Patient has had increased nasal congestion. She denies fever or chills. Past Medical History  Diagnosis Date  . Hypertension   . GERD (gastroesophageal reflux disease)   . Tachycardia   . Diabetes mellitus without complication    Past Surgical History  Procedure Laterality Date  . Wisdom tooth extraction     Family History  Problem Relation Age of Onset  . Seizures Mother   . Cancer Father     Liver  . Diabetes Father 32  . Hypertension Father    . Heart disease Father 88    CEA, LE Stenting  . Alzheimer's disease Maternal Grandmother   . Heart disease Maternal Grandfather    History  Substance Use Topics  . Smoking status: Former Smoker    Types: Cigarettes    Quit date: 08/19/2014  . Smokeless tobacco: Never Used     Comment: Quit smoking last month  . Alcohol Use: 0.0 oz/week    0 Not specified per week     Comment: Usually 1 time a month   OB History    Gravida Para Term Preterm AB TAB SAB Ectopic Multiple Living   2 0 0 0 2 1 1 0 0 0      Review of Systems  Constitutional: Negative for fever and chills.  HENT: Positive for congestion and trouble swallowing. Negative for sore throat and voice change.   Respiratory: Positive for shortness of breath. Negative for cough.   Cardiovascular: Positive for chest pain and palpitations. Negative for leg swelling.  Gastrointestinal: Negative for nausea, vomiting, abdominal pain, diarrhea and constipation.  Musculoskeletal: Negative for myalgias, back pain, neck pain and neck stiffness.  Skin: Negative for rash and wound.  Neurological: Positive for dizziness and light-headedness. Negative for syncope, weakness, numbness and headaches.  Psychiatric/Behavioral: The patient is nervous/anxious.   All other systems reviewed and are negative.     Allergies  Penicillins and Tamiflu  Home Medications   Prior to Admission medications   Medication Sig Start Date End Date Taking? Authorizing Provider  acetaminophen (TYLENOL) 500 MG tablet Take 1,000 mg by mouth every  6 (six) hours as needed for mild pain.   Yes Historical Provider, MD  calcium elemental as carbonate (TUMS ULTRA 1000) 400 MG tablet Chew 1,000 mg by mouth 2 (two) times daily as needed for heartburn.   Yes Historical Provider, MD  fluticasone (FLONASE) 50 MCG/ACT nasal spray Place 1 spray into both nostrils daily as needed for allergies or rhinitis.   Yes Historical Provider, MD  LORazepam (ATIVAN) 0.5 MG tablet  Take 0.25 mg by mouth every 8 (eight) hours as needed for anxiety or sleep.    Yes Historical Provider, MD  Multiple Vitamins-Minerals (HAIR/SKIN/NAILS PO) Take 2 tablets by mouth daily.   Yes Historical Provider, MD  propranolol (INDERAL) 40 MG tablet Take 1 tablet (40 mg total) by mouth 3 (three) times daily as needed (anxiety). 09/10/14  Yes [name], MD  Vitamin D, Ergocalciferol, (DRISDOL) 50000 UNITS CAPS capsule Take 50,000 Units by mouth every 7 (seven) days. Monday 07/30/14  Yes Historical Provider, MD  busPIRone (BUSPAR) 5 MG tablet Take 1 tablet (5 mg total) by mouth 2 (two) times daily. Patient not taking: Reported on 09/19/2014 09/18/14   [name], MD  meloxicam (MOBIC) 15 MG tablet Take 1 tablet (15 mg total) by mouth daily. 08/04/14   [name], MD   BP 144/116 mmHg  Pulse 65  Temp(Src) 98.1 F (36.7 C) (Oral)  Resp 13  SpO2 100%  LMP 09/09/2014 Physical Exam  Constitutional: She is oriented to person, place, and time. She appears well-developed and well-nourished. No distress.  Patient is very anxious. Patient is speaking in a clear voice. She has no respiratory distress.  HENT:  Head: Normocephalic and atraumatic.  Mouth/Throat: Oropharynx is clear and moist.  Mild bilateral tonsillar hypertrophy without erythema or exudates. Bilateral nasal mucosal edema  Eyes: EOM are normal. Pupils are equal, round, and reactive to light.  Neck: Normal range of motion. Neck supple. No tracheal deviation present. No thyromegaly present.  Cardiovascular: Normal rate and regular rhythm.  Exam reveals no gallop and no friction rub.   No murmur heard. Pulmonary/Chest: Effort normal and breath sounds normal. No stridor. No respiratory distress. She has no wheezes. She has no rales. She exhibits no tenderness.  Abdominal: Soft. Bowel sounds are normal. She exhibits no distension and no mass. There is no tenderness. There is no rebound and no guarding.  Musculoskeletal: Normal range of  motion. She exhibits no edema or tenderness.  No calf swelling or tenderness.  Lymphadenopathy:    She has no cervical adenopathy.  Neurological: She is alert and oriented to person, place, and time.  5/5 motor in all extremities. Sensation is intact.  Skin: Skin is warm and dry. No rash noted. No erythema.  Nursing note and vitals reviewed.   ED Course  Procedures (including critical care time) Labs Review Labs Reviewed  CBC WITH DIFFERENTIAL - Abnormal; Notable for the following:    Lymphocytes Relative 47 (*)    Lymphs Abs 4.3 (*)    All other components within normal limits  COMPREHENSIVE METABOLIC PANEL - Abnormal; Notable for the following:    Potassium 3.5 (*)    Glucose, Bld 101 (*)    GFR calc non Af Amer 83 (*)    Anion gap 16 (*)    All other components within normal limits  LIPASE, BLOOD  I-STAT TROPOININ, ED  I-STAT BETA HCG BLOOD, ED ([facility], [facility], [facility] ONLY)    Imaging Review Dg Chest 2 View  09/19/2014  CLINICAL DATA:  Dysphagia, shortness of breath today. Sore throat with drainage for 2 days.  EXAM: CHEST  2 VIEW  COMPARISON:  Chest radiograph September 09, 2014  FINDINGS: Cardiomediastinal silhouette is unremarkable. The lungs are clear without pleural effusions or focal consolidations. Trachea projects midline and there is no pneumothorax. Soft tissue planes and included osseous structures are non-suspicious. Mild degenerative change of thoracic spine.  IMPRESSION: No active cardiopulmonary disease.   Electronically Signed   By: Elon Alas   On: 09/19/2014 23:32   Mr Jeri Cos [facility] Contrast  09/19/2014     [facility] [address], [place], [place] [address] [phone]  NEUROIMAGING REPORT   STUDY DATE: 09/17/14 PATIENT NAME: SHAYLEEN EPPINGER DOB: [birth year] MRN: [id number]  ORDERING CLINICIAN: Dr Krista Blue CLINICAL HISTORY:  38 year patient with dizziness COMPARISON FILMS:  CT Head wo 09/09/2014 EXAM: MRI Brain wo TECHNIQUE: MRI of the  brain with and without contrast was obtained utilizing 5 mm axial slices with T1, T2, T2 flair, T2 star gradient echo and diffusion weighted views.  T1 sagittal, T2 coronal and postcontrast views in the axial and coronal plane were obtained.   CONTRAST: none IMAGING SITE: [facility] [place]:  The brain parenchyma shows no abnormal signal intensities. No structural lesion, tomorrow infarcts are noted.No abnormal lesions are seen on diffusion-weighted views to suggest acute ischemia. The cortical sulci, fissures and cisterns are normal in size and appearance. Lateral, third and fourth ventricle are normal in size and appearance. No extra-axial fluid collections are seen. No evidence of mass effect or midline shift.  No abnormal lesions are seen on post contrast views.  On sagittal views the posterior fossa, pituitary gland and corpus callosum are unremarkable. No evidence of intracranial hemorrhage on gradient-echo views. The orbits and their contents, paranasal sinuses and calvarium are unremarkable.  Intracranial flow voids are present.     IMPRESSION:  Unremarkable MRI scan of the brain with and without contrast   INTERPRETING PHYSICIAN:  PRAMOD SETHI, MD Certified in  Neuroimaging by [employer] and [employer]      EKG Interpretation None      MDM   Final diagnoses:  SOB (shortness of breath)    Patient has been extensively worked up over the past few weeks. Vital signs in the emergency department are normal. She has a normal neurologic exam. EKG without any acute changes. Long discussion with the patient regarding her symptoms and the likelihood that they arecaused by her anxiety. Have recommended stress relieving activities as well as starting medications that were prescribed for her. She may need to see a psychiatrist/psychologist for ongoing symptomatology. She should continue to follow-up with cardiology. She is  having no trouble eating and drinking at this point. She has mild tonsillar hypertrophy which may be contributing to her sensation of difficulty swallowing. There is no evidence for obstruction. She's been given return precautions and is voiced understanding.  Julianne Rice, MD 09/20/14 [phone]

## 2014-09-20 NOTE — Discharge Instructions (Signed)
Generalized Anxiety Disorder Generalized anxiety disorder (GAD) is a mental disorder. It interferes with life functions, including relationships, work, and school. GAD is different from normal anxiety, which everyone experiences at some point in their lives in response to specific life events and activities. Normal anxiety actually helps us prepare for and get through these life events and activities. Normal anxiety goes away after the event or activity is over.  GAD causes anxiety that is not necessarily related to specific events or activities. It also causes excess anxiety in proportion to specific events or activities. The anxiety associated with GAD is also difficult to control. GAD can vary from mild to severe. People with severe GAD can have intense waves of anxiety with physical symptoms (panic attacks).  SYMPTOMS The anxiety and worry associated with GAD are difficult to control. This anxiety and worry are related to many life events and activities and also occur more days than not for 6 months or longer. People with GAD also have three or more of the following symptoms (one or more in children):  Restlessness.   Fatigue.  Difficulty concentrating.   Irritability.  Muscle tension.  Difficulty sleeping or unsatisfying sleep. DIAGNOSIS GAD is diagnosed through an assessment by your health care provider. Your health care provider will ask you questions aboutyour mood,physical symptoms, and events in your life. Your health care provider may ask you about your medical history and use of alcohol or drugs, including prescription medicines. Your health care provider may also do a physical exam and blood tests. Certain medical conditions and the use of certain substances can cause symptoms similar to those associated with GAD. Your health care provider may refer you to a mental health specialist for further evaluation. TREATMENT The following therapies are usually used to treat GAD:    Medication. Antidepressant medication usually is prescribed for long-term daily control. Antianxiety medicines may be added in severe cases, especially when panic attacks occur.   Talk therapy (psychotherapy). Certain types of talk therapy can be helpful in treating GAD by providing support, education, and guidance. A form of talk therapy called cognitive behavioral therapy can teach you healthy ways to think about and react to daily life events and activities.  Stress managementtechniques. These include yoga, meditation, and exercise and can be very helpful when they are practiced regularly. A mental health specialist can help determine which treatment is best for you. Some people see improvement with one therapy. However, other people require a combination of therapies. Document Released: 02/18/2013 Document Revised: 03/10/2014 Document Reviewed: 02/18/2013 ExitCare Patient Information 2015 [employer]. This information is not intended to replace advice given to you by your health care provider. Make sure you discuss any questions you have with your health care provider.  

## 2014-09-20 NOTE — ED Notes (Signed)
Pt reports 2 episodes of sudden pain in chest, lasting only a second but sts she felt faint afterwards. VS rechecked.

## 2014-09-22 ENCOUNTER — Telehealth (HOSPITAL_COMMUNITY): Payer: Self-pay | Admitting: *Deleted

## 2014-09-22 ENCOUNTER — Telehealth: Payer: Self-pay

## 2014-09-22 ENCOUNTER — Telehealth: Payer: Self-pay | Admitting: Neurology

## 2014-09-22 DIAGNOSIS — E669 Obesity, unspecified: Secondary | ICD-10-CM

## 2014-09-22 DIAGNOSIS — R002 Palpitations: Secondary | ICD-10-CM

## 2014-09-22 LAB — RESPIRATORY ALLERGY PROFILE REGION II ~~LOC~~
Allergen, Cedar tree, t12: <0.1 kU/L
Allergen, Comm Silver Birch, t9: <0.1 kU/L
Allergen, Cottonwood, t14: <0.1 kU/L
Allergen, D pternoyssinus,d7: 0.21 kU/L — ABNORMAL HIGH
Allergen, Mouse Urine Protein, e78: <0.1 kU/L
Allergen, Mulberry, t76: <0.1 kU/L
Alternaria Alternata: <0.1 kU/L
Aspergillus fumigatus, m3: <0.1 kU/L
Bermuda Grass: <0.1 kU/L
Box Elder IgE: <0.1 kU/L
Cat Dander: <0.1 kU/L
Cladosporium Herbarum: <0.1 kU/L
Cockroach: <0.1 kU/L
Common Ragweed: <0.1 kU/L
D. farinae: 0.26 kU/L — ABNORMAL HIGH
Dog Dander: <0.1 kU/L
Elm IgE: <0.1 kU/L
IgE (Immunoglobulin E), Serum: 2 kU/L (ref <115)
Johnson Grass: <0.1 kU/L
Oak: <0.1 kU/L
Pecan/Hickory Tree IgE: <0.1 kU/L
Penicillium Notatum: <0.1 kU/L
Rough Pigweed  IgE: <0.1 kU/L
Sheep Sorrel IgE: <0.1 kU/L
Timothy Grass: <0.1 kU/L

## 2014-09-22 NOTE — Telephone Encounter (Signed)
The patient called, very concerned, that her heart rate was dropping into the low 50's at night and right after she wakes up.  The patient is also experiencing shortness of breath correlating with these drops in hear rate as well.  The patient would like to know if this is normal or if this needs to be evaluated further.  Please call the patient at [phone].

## 2014-09-22 NOTE — Telephone Encounter (Signed)
Pt called in today c/o shortness of breath (even this morning) and wants to know if her strips can be read from her monitor.

## 2014-09-22 NOTE — Telephone Encounter (Signed)
Patient has been to the ER again. She was told she needs an echo. Will get the strips from her event monitor from [address] for dr hochrein's review

## 2014-09-22 NOTE — Telephone Encounter (Signed)
Strips from event monitor are sinus to sinus tach. Discussed with dr hochrein, okay to schedule echo. Orders placed, telephone number is disconnected.

## 2014-09-22 NOTE — Telephone Encounter (Signed)
Patient is calling to get MRI results. It is ok to leave a detailed message on patient's voicemail.

## 2014-09-22 NOTE — Progress Notes (Signed)
Quick Note:  Called and left patient a message Normal MRI. ______

## 2014-09-22 NOTE — Telephone Encounter (Signed)
MRI results were normal.

## 2014-09-23 ENCOUNTER — Other Ambulatory Visit: Payer: 59

## 2014-09-23 ENCOUNTER — Ambulatory Visit (HOSPITAL_COMMUNITY)
Admission: RE | Admit: 2014-09-23 | Discharge: 2014-09-23 | Disposition: A | Discharge status: Home / Self Care | Payer: 59 | Source: Ambulatory Visit | Attending: Cardiovascular Disease | Admitting: Cardiovascular Disease

## 2014-09-23 DIAGNOSIS — R42 Dizziness and giddiness: Secondary | ICD-10-CM | POA: Insufficient documentation

## 2014-09-23 DIAGNOSIS — R002 Palpitations: Secondary | ICD-10-CM

## 2014-09-23 NOTE — Telephone Encounter (Signed)
HR into 50s is normal - especially overnight.  The shortness of breath is likely from the anxiety from this episode. She could call cardiology to have them review her event monitor.  If she has shortness of breath overnight that is waking her from sleep or she wakes up gasping she should call neurology to see if she can schedule her sleep study/polysomnogram - she was referred for this a while ago and never went.

## 2014-09-23 NOTE — Telephone Encounter (Signed)
Lm for rtn call 

## 2014-09-23 NOTE — Telephone Encounter (Signed)
Pt advised Dr. Raul Del message-  Wants to have sleep study done- referral needs to be authorized again per pt it has expired.

## 2014-09-23 NOTE — Progress Notes (Signed)
2D Echocardiogram Complete.  09/23/2014   Brooke Turner [place], [place]

## 2014-09-24 ENCOUNTER — Telehealth: Payer: Self-pay

## 2014-09-24 LAB — CARBOXYHEMOGLOBIN: Carboxyhemoglobin: 4 %TOTAL HGB (ref <=12)

## 2014-09-24 NOTE — Telephone Encounter (Signed)
PATIENT STATES SHE WAS IN TO SEE DR. SHAW ON Thursday FOR ANXIETY. SHE GAVE HER DUSTIRONE. SHE STARTED TO TAKE IT ON Saturday. SHE HAS BEEN READING ABOUT IT AND SHE WANTS TO TALK TO SOMEONE ABOUT THE SIDE EFFECTS. SHE SAID IT CAN CAUSE PASSING OUT, TACHYCARDIA, ETC. SHE WANTS TO KNOW IF SHE CAN JUST STOP TAKING THE MEDICINE WITHOUT IT CAUSING HER ANY PROBLEMS? SHE WOULD LIKE A CALL BACK AS SOON AS POSSIBLE PLEASE. BEST PHONE [phone] (CELL)  Brooke Turner

## 2014-09-24 NOTE — Telephone Encounter (Signed)
Patient is calling again to get MRI results. Results were left at  her Mom's telephone number.  Please call patient at [phone] with results. It is ok to leave a detailed message. Thank you.

## 2014-09-24 NOTE — Telephone Encounter (Signed)
Called number and did not leave message because it said Ronalee Belts with [place].

## 2014-09-25 ENCOUNTER — Other Ambulatory Visit: Payer: 59

## 2014-09-25 ENCOUNTER — Telehealth: Payer: Self-pay

## 2014-09-25 ENCOUNTER — Telehealth: Payer: Self-pay | Admitting: Neurology

## 2014-09-25 ENCOUNTER — Telehealth: Payer: Self-pay | Admitting: Cardiology

## 2014-09-25 DIAGNOSIS — R5382 Chronic fatigue, unspecified: Secondary | ICD-10-CM

## 2014-09-25 DIAGNOSIS — I10 Essential (primary) hypertension: Secondary | ICD-10-CM

## 2014-09-25 NOTE — Telephone Encounter (Signed)
Number was documented incorrectly- area code [phone] for rtn call  These are potential side effects. Pt has recently complained of her heart rate being too low and this was not a huge concern. Pt and Dr. Brigitte Pulse discussed this in great detail during her OV.  Pt may experience these symptoms while building the medication in her system. These side effects will start to decrease after about two weeks.  If she is unable to tolerate these POTENTIAL side effects she can be switched to citalopram. Please place order for Citalopram 10mg  1 tablet QD #30 1 refill. Recheck in 6 weeks per VO Dr. Brigitte Pulse.

## 2014-09-25 NOTE — Telephone Encounter (Signed)
Called and left message x3 MRI and EEG were normal.

## 2014-09-25 NOTE — Telephone Encounter (Signed)
Left detailed message X3. MRI results normal and her EEG.

## 2014-09-25 NOTE — Telephone Encounter (Signed)
Left message for pt to call back in regards to Echo results.  Dr Percival Spanish reviewed on 09/24/14. Results already sent to Dr Brigitte Pulse.

## 2014-09-25 NOTE — Telephone Encounter (Signed)
Spoke to pt again- she decided she is going to take one tablet Buspar in the morning for one week then increase it to 1 tablet BID. She will call if any concerns. She knows to follow up in 6 weeks and will need a refill.

## 2014-09-25 NOTE — Telephone Encounter (Signed)
Pt called back she is going to stop taking Buspar until she has completed the heart monitor and then will try it. She does not want to have the side effects have any impact on the results of her monitor.

## 2014-09-25 NOTE — Telephone Encounter (Signed)
Pt would like her echo results from 09-23-14 please.

## 2014-09-25 NOTE — Telephone Encounter (Signed)
Noted, thanks!

## 2014-09-25 NOTE — Telephone Encounter (Signed)
Patient requesting Internal referral for Sleep Study.  Patient was referred by PCP and was instructed by Sleep lab to get Internal referral in order to get appointment sooner.  Please call and advise

## 2014-09-26 ENCOUNTER — Telehealth: Payer: Self-pay

## 2014-09-26 DIAGNOSIS — R5383 Other fatigue: Secondary | ICD-10-CM | POA: Insufficient documentation

## 2014-09-26 NOTE — Telephone Encounter (Signed)
I have placed order for sleep study, she has follow up in Nov 24th, will discuss in detail then

## 2014-09-26 NOTE — Telephone Encounter (Signed)
Pt says somebody called her from here today with her results, she did not know who it was. Please leave results on her voicemail.

## 2014-09-26 NOTE — Telephone Encounter (Signed)
Patient called - wants to know if her hemoglobin has ever been checked. Wants to know if she has anemia. Please return call and advise. CB # [phone]

## 2014-09-26 NOTE — Telephone Encounter (Signed)
Hemoglobin has been checked a few times in the recent past and pt is not anemic. LM for pt with information.

## 2014-09-28 ENCOUNTER — Telehealth: Payer: Self-pay | Admitting: Physician Assistant

## 2014-09-28 NOTE — Telephone Encounter (Signed)
Please call patient and let her know that her allergy tests shows a mild allergic response to mites but nothing to mold.  Her CO test was normal.

## 2014-09-28 NOTE — Telephone Encounter (Signed)
Please call the patient and let her know that her allergy tests were neg to mold.  She does have mild allergic response to mites.  Her carbon monoxide test was neg.

## 2014-09-29 ENCOUNTER — Ambulatory Visit (INDEPENDENT_AMBULATORY_CARE_PROVIDER_SITE_OTHER): Payer: 59 | Admitting: Family Medicine

## 2014-09-29 VITALS — BP 133/91 | HR 69 | Temp 98.0°F | Resp 16 | Ht 62.5 in | Wt 213.0 lb

## 2014-09-29 DIAGNOSIS — F411 Generalized anxiety disorder: Secondary | ICD-10-CM

## 2014-09-29 DIAGNOSIS — R5382 Chronic fatigue, unspecified: Secondary | ICD-10-CM

## 2014-09-29 DIAGNOSIS — R42 Dizziness and giddiness: Secondary | ICD-10-CM

## 2014-09-29 DIAGNOSIS — K219 Gastro-esophageal reflux disease without esophagitis: Secondary | ICD-10-CM

## 2014-09-29 DIAGNOSIS — R0602 Shortness of breath: Secondary | ICD-10-CM

## 2014-09-29 MED ORDER — OMEPRAZOLE 40 MG PO CPDR: 40.0000 mg | DELAYED_RELEASE_CAPSULE | Freq: Every day | ORAL | Status: DC

## 2014-09-29 NOTE — Patient Instructions (Signed)
Increase buspar to twice a day - this is still not a therapeutic dose.  Most people need to be on 20 to 30 mg total daily (divided into 2-3 doses).   Let me know if you want to start looking at your esophagus as cause of your symptoms. We can still consider citalopram if you do not like the buspar.  Gastroesophageal Reflux Disease, Adult Gastroesophageal reflux disease (GERD) happens when acid from your stomach flows up into the esophagus. When acid comes in contact with the esophagus, the acid causes soreness (inflammation) in the esophagus. Over time, GERD may create small holes (ulcers) in the lining of the esophagus. CAUSES   Increased body weight. This puts pressure on the stomach, making acid rise from the stomach into the esophagus.  Smoking. This increases acid production in the stomach.  Drinking alcohol. This causes decreased pressure in the lower esophageal sphincter (valve or ring of muscle between the esophagus and stomach), allowing acid from the stomach into the esophagus.  Late evening meals and a full stomach. This increases pressure and acid production in the stomach.  A malformed lower esophageal sphincter. Sometimes, no cause is found. SYMPTOMS   Burning pain in the lower part of the mid-chest behind the breastbone and in the mid-stomach area. This may occur twice a week or more often.  Trouble swallowing.  Sore throat.  Dry cough.  Asthma-like symptoms including chest tightness, shortness of breath, or wheezing. DIAGNOSIS  Your caregiver may be able to diagnose GERD based on your symptoms. In some cases, X-rays and other tests may be done to check for complications or to check the condition of your stomach and esophagus. TREATMENT  Your caregiver may recommend over-the-counter or prescription medicines to help decrease acid production. Ask your caregiver before starting or adding any new medicines.  HOME CARE INSTRUCTIONS   Change the factors that you can  control. Ask your caregiver for guidance concerning weight loss, quitting smoking, and alcohol consumption.  Avoid foods and drinks that make your symptoms worse, such as:  Caffeine or alcoholic drinks.  Chocolate.  Peppermint or mint flavorings.  Garlic and onions.  Spicy foods.  Citrus fruits, such as oranges, lemons, or limes.  Tomato-based foods such as sauce, chili, salsa, and pizza.  Fried and fatty foods.  Avoid lying down for the 3 hours prior to your bedtime or prior to taking a nap.  Eat small, frequent meals instead of large meals.  Wear loose-fitting clothing. Do not wear anything tight around your waist that causes pressure on your stomach.  Raise the head of your bed 6 to 8 inches with wood blocks to help you sleep. Extra pillows will not help.  Only take over-the-counter or prescription medicines for pain, discomfort, or fever as directed by your caregiver.  Do not take aspirin, ibuprofen, or other nonsteroidal anti-inflammatory drugs (NSAIDs). SEEK IMMEDIATE MEDICAL CARE IF:   You have pain in your arms, neck, jaw, teeth, or back.  Your pain increases or changes in intensity or duration.  You develop nausea, vomiting, or sweating (diaphoresis).  You develop shortness of breath, or you faint.  Your vomit is green, yellow, black, or looks like coffee grounds or blood.  Your stool is red, bloody, or black. These symptoms could be signs of other problems, such as heart disease, gastric bleeding, or esophageal bleeding. MAKE SURE YOU:   Understand these instructions.  Will watch your condition.  Will get help right away if you are not doing well  or get worse. Document Released: 08/03/2005 Document Revised: 01/16/2012 Document Reviewed: 05/13/2011 [facility] Patient Information 2015 [place], [place]. This information is not intended to replace advice given to you by your health care provider. Make sure you discuss any questions you have with your health  care provider.   Esophageal Spasm Esophageal spasm is an uncoordinated contraction of the muscles of the esophagus (the tube which carries food from your mouth to your stomach). Normally, the muscles of the esophagus alternate between contraction and relaxation starting from the top of the esophagus and working down to the bottom. This moves the food from the mouth to the stomach. In esophageal spasm, all the muscles contract at once. This causes pain and fails to move the food along. As a result, you may have trouble swallowing.  Women are more likely than men to have esophageal spasm. The cause of the spasms is not known. Sometimes eating hot or cold foods triggers the condition and this may be due to an overly sensitive esophagus. This is not an infectious disease and cannot be passed to others. SYMPTOMS  Symptoms of esophageal spasm may include: chest pain, burning or pain with swallowing, and difficulty swallowing.  DIAGNOSIS  Esophageal spasm can be diagnosed by a test called manometry (pressure studies of the esophagus). In this test, a special tube is inserted down the esophagus. The tube measures the muscle activity of the esophagus. Abnormal contractions mixed with normal movement helps confirm the diagnosis.  A person with a hypersensitive esophagus may be diagnosed by inflating a long balloon in the person's esophagus. If this causes the same symptoms, preventive methods may work. PREVENTION  Avoid hot or cold foods if that seems to be a trigger. PROGNOSIS  This condition does not go away, nor is treatment entirely satisfactory. Patients need to be careful of what they eat. They need to continue on medication if a useful one is found. Fortunately, the condition does not get progressively worse as time passes. Esophageal spasm does not usually lead to more serious problems but sometimes the pain can be disabling. If a person becomes afraid to eat they may become malnourished and lose  weight.  TREATMENT   A procedure in which instruments of increasing size are inserted through the esophagus to enlarge (dilate) it are used.  Medications that decrease acid-production of the stomach may be used such as proton-pump inhibitors or H2-blockers.  Medications of several types can be used to relax the muscles of the esophagus.  An individual with a hypersensitive esophagus sometimes improves with low doses of medications normally used for depression.  No treatment for esophageal spasm is effective for everyone. Often several approaches will be tried before one works. In many cases, the symptoms will improve, but will not go away completely.  For severe cases, relief is obtained two-thirds of the time by cutting the muscles along the entire length of the esophagus. This is a major surgical procedure.  Your symptoms are usually the best guide to how well the treatment for esophageal spasm works. SIDE EFFECTS OF TREATMENTS  Nitrates can cause headaches and low blood pressure.  Calcium channel blockers can cause:  Feeling sick to your stomach (nausea).  Constipation and other side effects.  Antidepressants can cause side effects that depend on the medication used. HOME CARE INSTRUCTIONS   Let your caregiver know if problems are getting worse, or if you get food stuck in your esophagus for longer than 1 hour or as directed and are unable  to swallow liquid.  Take medications as directed and with permission of your caregiver. Ask about what to do if a medication seems to get stuck in your esophagus. Only take over-the-counter or prescription medicines for pain, discomfort, or fever as directed by your caregiver.  Soft and liquid foods pass more easily than solid pieces. SEEK IMMEDIATE MEDICAL CARE IF:   You develop severe chest pain, especially if the pain is crushing or pressure-like and spreads to the arms, back, neck, or jaw, or if you have sweating, nausea, or shortness of  breath. THIS COULD BE AN EMERGENCY. Do not wait to see if the pain will go away. Get medical help at once. Call 911 or [phone] (operator). DO NOT drive yourself to the hospital.  Your chest pain gets worse and does not go away with rest.  You have an attack of chest pain lasting longer than usual despite rest and treatment with the medications your physician has prescribed.  You wake from sleep with chest pain or shortness of breath.  You feel dizzy or faint.  You have chest pain, not typical of your usual pain, caused by your esophagus for which you originally saw your caregiver. MAKE SURE YOU:   Understand these instructions.  Will watch your condition.  Will get help right away if you are not doing well or get worse. Document Released: 01/14/2003 Document Revised: 01/16/2012 Document Reviewed: 01/17/2014 [facility] Patient Information 2015 [place], [place]. This information is not intended to replace advice given to you by your health care provider. Make sure you discuss any questions you have with your health care provider.   Sleep Apnea  Sleep apnea is a sleep disorder characterized by abnormal pauses in breathing while you sleep. When your breathing pauses, the level of oxygen in your blood decreases. This causes you to move out of deep sleep and into light sleep. As a result, your quality of sleep is poor, and the system that carries your blood throughout your body (cardiovascular system) experiences stress. If sleep apnea remains untreated, the following conditions can develop:  High blood pressure (hypertension).  Coronary artery disease.  Inability to achieve or maintain an erection (impotence).  Impairment of your thought process (cognitive dysfunction). There are three types of sleep apnea:  Obstructive sleep apnea--Pauses in breathing during sleep because of a blocked airway.  Central sleep apnea--Pauses in breathing during sleep because the area of the brain that controls  your breathing does not send the correct signals to the muscles that control breathing.  Mixed sleep apnea--A combination of both obstructive and central sleep apnea. RISK FACTORS The following risk factors can increase your risk of developing sleep apnea:  Being overweight.  Smoking.  Having narrow passages in your nose and throat.  Being of older age.  Being female.  Alcohol use.  Sedative and tranquilizer use.  Ethnicity. Among individuals younger than 35 years, African Americans are at increased risk of sleep apnea. SYMPTOMS   Difficulty staying asleep.  Daytime sleepiness and fatigue.  Loss of energy.  Irritability.  Loud, heavy snoring.  Morning headaches.  Trouble concentrating.  Forgetfulness.  Decreased interest in sex. DIAGNOSIS  In order to diagnose sleep apnea, your caregiver will perform a physical examination. Your caregiver may suggest that you take a home sleep test. Your caregiver may also recommend that you spend the night in a sleep lab. In the sleep lab, several monitors record information about your heart, lungs, and brain while you sleep. Your leg and arm  movements and blood oxygen level are also recorded. TREATMENT The following actions may help to resolve mild sleep apnea:  Sleeping on your side.   Using a decongestant if you have nasal congestion.   Avoiding the use of depressants, including alcohol, sedatives, and narcotics.   Losing weight and modifying your diet if you are overweight. There also are devices and treatments to help open your airway:  Oral appliances. These are custom-made mouthpieces that shift your lower jaw forward and slightly open your bite. This opens your airway.  Devices that create positive airway pressure. This positive pressure "splints" your airway open to help you breathe better during sleep. The following devices create positive airway pressure:  Continuous positive airway pressure (CPAP) device. The  CPAP device creates a continuous level of air pressure with an air pump. The air is delivered to your airway through a mask while you sleep. This continuous pressure keeps your airway open.  Nasal expiratory positive airway pressure (EPAP) device. The EPAP device creates positive air pressure as you exhale. The device consists of single-use valves, which are inserted into each nostril and held in place by adhesive. The valves create very little resistance when you inhale but create much more resistance when you exhale. That increased resistance creates the positive airway pressure. This positive pressure while you exhale keeps your airway open, making it easier to breath when you inhale again.  Bilevel positive airway pressure (BPAP) device. The BPAP device is used mainly in patients with central sleep apnea. This device is similar to the CPAP device because it also uses an air pump to deliver continuous air pressure through a mask. However, with the BPAP machine, the pressure is set at two different levels. The pressure when you exhale is lower than the pressure when you inhale.  Surgery. Typically, surgery is only done if you cannot comply with less invasive treatments or if the less invasive treatments do not improve your condition. Surgery involves removing excess tissue in your airway to create a wider passage way. Document Released: 10/14/2002 Document Revised: 02/18/2013 Document Reviewed: 03/01/2012 [facility] Patient Information 2015 [place], [place]. This information is not intended to replace advice given to you by your health care provider. Make sure you discuss any questions you have with your health care provider.

## 2014-09-29 NOTE — Telephone Encounter (Signed)
Released to mychart

## 2014-09-29 NOTE — Progress Notes (Addendum)
Subjective:  This chart was scribed for Brooke Cheadle, MD by Brooke Turner, Medical scribe. This patient was seen in ROOM 8 and the patient's care was started 8:10 PM.  Patient ID: Brooke Turner, female    DOB: 06/03/1976, 38 y.o.   MRN: [id number]   Chief Complaint  Patient presents with  . Shortness of Breath  . Dysphagia  . Other    disability     HPI HPI Comments: Brooke Turner is a 38 y.o. female with a history of GERD, HTN and anxiety who presents to [facility] complaining of dysphagia that started 2 weeks ago. Pt reports associated SOB. Pt states she took Claritin which has provided mild relief to her symptoms. She says her symptoms are worse in the morning. Pt says that this has caused her to avoid solid food because she is scared it could worsen her dysphagia. She states that she has indigestion frequently and states she takes Tums 3-4 times a week. Pt states she started her Buspar about 10 days ago but discontinued it shortly afterwards because she says she had an attack. She states she restarted taking the medication 3 days ago and has been taking 1/day. Pt states she has been walking for exercise recently but says she sometimes gets dizzy when she exercises and has to stop. Pt reports when was recently in [place] she did not have an anxiety attacks. She says she may move back to be with her mother her lives there this December and January. She states the only medication she is on is the Buspar once daily at this time.  Pt states the last time she took her Lorazepam was 2.5 weeks ago.   Pt was last seen 11 days previously. She came to [facility] twice in the same day, the 2nd time she saw Weber PA to have her CO2 level checked as well as allergy testing for mold as a source of her lightheadedness, CP, palpitations and dyspnea. She has been wearing an event monitor and has called cardiology who states that so far her strips during these episodes have shown sinus rhythym or sinus  tach. She also had an echocardiogram done 5 days prior which were normal. She also had her MRI of her brain 10 days ago which was normal. She an EEG which was normal. Fortunately she is becoming more accustomed to her symptoms as her last ER visit was 9 days prior. Pt was recommended to schedule a sleep study to check for OSA. Pt was started on Buspar to help with her anxiety, however she was concerned that the side effects of the tachycardia, palpitations and dizziness could worsen her current symptoms and was started half the recommended ago a few days previously.    Patient Active Problem List   Diagnosis Date Noted  . Fatigue 09/26/2014  . Dizziness and giddiness 09/11/2014  . Tachycardia 09/11/2014  . Precordial chest pain 09/11/2014  . Prediabetes 08/26/2014  . Acanthosis nigricans 03/19/2014  . Type II or unspecified type diabetes mellitus without mention of complication, not stated as uncontrolled 07/15/2013  . Vitamin D deficiency 07/15/2013  . Large breasts 09/12/2012  . HTN (hypertension) 02/17/2012  . Contraception management 02/17/2012  . Obesity, Class III, BMI 40-49.9 (morbid obesity) 02/17/2012  . Anxiety disorder 02/17/2012  . Chronic back pain 02/17/2012    Past Medical History  Diagnosis Date  . Hypertension   . GERD (gastroesophageal reflux disease)   . Tachycardia   . Diabetes mellitus without  complication    Allergies  Allergen Reactions  . Penicillins Hives  . Tamiflu [Oseltamivir Phosphate] Nausea And Vomiting   Current Outpatient Prescriptions on File Prior to Visit  Medication Sig Dispense Refill  . busPIRone (BUSPAR) 5 MG tablet Take 1 tablet (5 mg total) by mouth 2 (two) times daily. 60 tablet 0  . calcium elemental as carbonate (TUMS ULTRA 1000) 400 MG tablet Chew 1,000 mg by mouth 2 (two) times daily as needed for heartburn.    Marland Kitchen acetaminophen (TYLENOL) 500 MG tablet Take 1,000 mg by mouth every 6 (six) hours as needed for mild pain.    .  fluticasone (FLONASE) 50 MCG/ACT nasal spray Place 1 spray into both nostrils daily as needed for allergies or rhinitis.    Marland Kitchen loratadine (CLARITIN) 10 MG tablet Take 1 tablet (10 mg total) by mouth daily. (Patient not taking: Reported on 09/29/2014) 30 tablet 0  . LORazepam (ATIVAN) 0.5 MG tablet Take 0.25 mg by mouth every 8 (eight) hours as needed for anxiety or sleep.     . meloxicam (MOBIC) 15 MG tablet Take 1 tablet (15 mg total) by mouth daily. (Patient not taking: Reported on 09/29/2014) 30 tablet 0  . Multiple Vitamins-Minerals (HAIR/SKIN/NAILS PO) Take 2 tablets by mouth daily.     No current facility-administered medications on file prior to visit.   Family History  Problem Relation Age of Onset  . Seizures Mother   . Cancer Father     Liver  . Diabetes Father 26  . Hypertension Father   . Heart disease Father 60    CEA, LE [place]  . Alzheimer's disease Maternal Grandmother   . Heart disease Maternal Grandfather    Past Surgical History  Procedure Laterality Date  . Wisdom tooth extraction     Review of Systems  HENT: Negative for sore throat.   Respiratory: Positive for shortness of breath.   Neurological: Positive for light-headedness.  Psychiatric/Behavioral: The patient is nervous/anxious.      Objective:  Physical Exam  Constitutional: She is oriented to person, place, and time. She appears well-developed and well-nourished.  HENT:  Head: Normocephalic and atraumatic.  Neck: No tracheal deviation present.  Cardiovascular: Normal rate, regular rhythm and normal heart sounds.   No murmur heard. Pulmonary/Chest: Breath sounds normal. No respiratory distress. She has no wheezes. She has no rales.  Abdominal: She exhibits no distension.  Lymphadenopathy:    She has no cervical adenopathy.  Neurological: She is alert and oriented to person, place, and time.  Skin: Skin is warm and dry.  Psychiatric: She has a normal mood and affect. Her behavior is normal.    Nursing note and vitals reviewed.  Filed Vitals:   09/29/14 1956  BP: 146/84  Pulse: 82  Temp: 98 F (36.7 C)  TempSrc: Oral  Resp: 16  Height: 5' 2.5" (1.588 m)  Weight: 213 lb (96.616 kg)  SpO2: 100%      Assessment & Plan:   Chronic fatigue - will sched sleep study through [facility] at first avail.  Gastroesophageal reflux disease without esophagitis - trial of prilosec for 6-8 wks. Likely due to anxiety but if having more dysphagia and foreign body sensation could do fluoroscopic swallow study and trial of ccb. PATIENT REPORTED THE PRILOSEC WAS INEFFECTIVE - WILL TRY HER ON NEXIUM   Anxiety state - Increase buspar to bid from qd - still on low dose and will likely need to go up from there. If she can't tolerate side  effects would switch to citalopram. Again encouraged to establish w/ therapist.  Still encouraging pt to consider taking FMLA off of work in several weeks so she has time to focus on her physical and mental healing.  Lightheadedness - has f/u w/ neuro tomorrow  Meds ordered this encounter  Medications  . omeprazole (PRILOSEC) 40 MG capsule    Sig: Take 1 capsule (40 mg total) by mouth daily. 30 minutes prior to dinner    Dispense:  30 capsule    Refill:  1    I personally performed the services described in this documentation, which was scribed in my presence. The recorded information has been reviewed and considered, and addended by me as needed.  Brooke Cheadle, MD MPH   Over 40 min spent in face-to-face evaluation of and consultation with patient and coordination of care.

## 2014-09-30 ENCOUNTER — Ambulatory Visit (INDEPENDENT_AMBULATORY_CARE_PROVIDER_SITE_OTHER): Payer: 59 | Admitting: Neurology

## 2014-09-30 ENCOUNTER — Encounter: Payer: Self-pay | Admitting: Neurology

## 2014-09-30 VITALS — BP 137/82 | HR 64 | Ht 63.5 in | Wt 211.0 lb

## 2014-09-30 DIAGNOSIS — R0683 Snoring: Secondary | ICD-10-CM | POA: Insufficient documentation

## 2014-09-30 DIAGNOSIS — R7303 Prediabetes: Secondary | ICD-10-CM

## 2014-09-30 DIAGNOSIS — I10 Essential (primary) hypertension: Secondary | ICD-10-CM

## 2014-09-30 DIAGNOSIS — R7309 Other abnormal glucose: Secondary | ICD-10-CM

## 2014-09-30 NOTE — Progress Notes (Signed)
PATIENT: Brooke Turner DOB: [birth year]  HISTORICAL  Brooke Turner Is a 38 years old right-handed female, referred by her primary care physician Dr. Brigitte Pulse, for evaluation of dizziness, headaches, heart palpitation.  She was previously healthy, in August 24 2014, after loss her family members, and her cat, she felt depressed, she had an alcoholic drink by her friends, shortly after worse, she had a set onset chest racing fast, dizziness, going to faint sensation, called EMS on her way driving back home, was taken to the emergency room,EKG showed sinus tachycardia up to 140, she was treated with Ativan, and IV hydration,potassium was 2.4, she was also given potassium supplement, symptoms has much improved.  Ever since the event,  she continues to have similar recurrent intermittent heart racing fast, dizziness lightheaded sensation, as if she is going to faint, initially was short lasting, few minutes, nauseous lasting much longer few hours,  She denies visual change, no lateralized motor or sensory deficit,  She presented to emergency room again in November first, and November third, for similar complaints of chest racing fast,mthere was documented tachycardia, heart rate 1 thirties.  Laboratory showed normal CMP, CBC, TSH,  She has a cardiology appointment, 21 day monitoring,   UPDATE Nov 24th 2015:  MRI of the brain was normal, EEG was normal,  She continued to have intermittent episodes of sudden onset palpation, lightheadedness, jittering sensation, dizziness, lasting 10-20 minutes, without loss of consciousness,  In addition, she complains of nighttime snoring, daytime excessive fatigue, and sleepiness, today's ESS score is 7, assess score is 41, she also complains of frequent nighttime awakening, heart burning, choking sensation,  She also complains of throat foreign object sensation, difficulty swallowing, was started on Nexium by primary care  physician  Echocardiogram was normal, she is receiving 21 days of cardiac monitoring, report is pending   REVIEW OF SYSTEMS: Full 14 system review of systems performed and notable only for appetite change, chills, unexpected WT change, ringing in ears, trouble swallowing, eye discharge, eye itching, eye redness, light sensitivity, blurred vision, shortness of breath, chest tightness, chest pain, palpitation, diarrhea, apnea, daytime sleepiness, snoring, muscle cramps, neck pain, neck stiffness, dizziness, headaches, tremors, nervousness  ALLERGIES: Allergies  Allergen Reactions  . Penicillins Hives  . Tamiflu [Oseltamivir Phosphate] Nausea And Vomiting    HOME MEDICATIONS: Current Outpatient Prescriptions on File Prior to Visit  Medication Sig Dispense Refill  . acetaminophen (TYLENOL) 500 MG tablet Take 1,000 mg by mouth every 6 (six) hours as needed for mild pain.    . busPIRone (BUSPAR) 5 MG tablet Take 1 tablet (5 mg total) by mouth 2 (two) times daily. 60 tablet 0  . fluticasone (FLONASE) 50 MCG/ACT nasal spray Place 1 spray into both nostrils daily as needed for allergies or rhinitis.    Marland Kitchen LORazepam (ATIVAN) 0.5 MG tablet Take 0.25 mg by mouth every 8 (eight) hours as needed for anxiety or sleep.     . Multiple Vitamins-Minerals (HAIR/SKIN/NAILS PO) Take 2 tablets by mouth daily.    Marland Kitchen omeprazole (PRILOSEC) 40 MG capsule Take 1 capsule (40 mg total) by mouth daily. 30 minutes prior to dinner 30 capsule 1   No current facility-administered medications on file prior to visit.    PAST MEDICAL HISTORY: Past Medical History  Diagnosis Date  . Hypertension   . GERD (gastroesophageal reflux disease)   . Tachycardia   . Diabetes mellitus without complication     PAST SURGICAL HISTORY: Past Surgical History  Procedure Laterality Date  . Wisdom tooth extraction      FAMILY HISTORY: Family History  Problem Relation Age of Onset  . Seizures Mother   . Cancer Father     Liver   . Diabetes Father 27  . Hypertension Father   . Heart disease Father 38    CEA, LE Stenting  . Alzheimer's disease Maternal Grandmother   . Heart disease Maternal Grandfather     SOCIAL HISTORY:  History   Social History  . Marital Status: Single    Spouse Name: N/A    Number of Children: 0  . Years of Education: 16   Occupational History  . Customer Service [employer]   Social History Main Topics  . Smoking status: Former Smoker    Types: Cigarettes    Quit date: 08/19/2014  . Smokeless tobacco: Never Used     Comment: Quit smoking last month  . Alcohol Use: 0.0 oz/week    0 Not specified per week     Comment: Usually 1 time a month  . Drug Use: No  . Sexual Activity:    Partners: Male    Birth Control/ Protection: Other-see comments     Comment: NuvaRing   Other Topics Concern  . Not on file   Social History Narrative   Patient lives at home alone .   Patient works full time at [employer]   Education college.   Right handed.   Caffeine none     PHYSICAL EXAM   Filed Vitals:   09/30/14 1113  BP: 137/82  Pulse: 64  Height: 5' 3.5" (1.613 m)  Weight: 211 lb (95.709 kg)    Not recorded      Body mass index is 36.79 kg/(m^2).   Generalized: In no acute distress  Neck: Supple, no carotid bruits   Cardiac: Regular rate rhythm  Pulmonary: Clear to auscultation bilaterally  Musculoskeletal: No deformity  Neurological examination  Mentation: Alert oriented to time, place, history taking, and causual conversation  Cranial nerve II-XII: Pupils were equal round reactive to light. Extraocular movements were full.  Visual field were full on confrontational test. Bilateral fundi were sharp.  Facial sensation and strength were normal. Hearing was intact to finger rubbing bilaterally. Uvula tongue midline.  Head turning and shoulder shrug and were normal and symmetric.Tongue protrusion into cheek strength was normal.  Motor: Normal tone, bulk  and strength.  Sensory: Intact to fine touch, pinprick, preserved vibratory sensation, and proprioception at toes.  Coordination: Normal finger to nose, heel-to-shin bilaterally there was no truncal ataxia  Gait: Rising up from seated position without assistance, normal stance, without trunk ataxia, moderate stride, good arm swing, smooth turning, able to perform tiptoe, and heel walking without difficulty.   Romberg signs: Negative  Deep tendon reflexes: Brachioradialis 2/2, biceps 2/2, triceps 2/2, patellar 2/2, Achilles 2/2, plantar responses were flexor bilaterally.   DIAGNOSTIC DATA (LABS, IMAGING, TESTING) - I reviewed patient records, labs, notes, testing and imaging myself where available.  Lab Results  Component Value Date   WBC 9.2 09/19/2014   HGB 12.8 09/19/2014   HCT 36.5 09/19/2014   MCV 85.9 09/19/2014   PLT 306 09/19/2014      Component Value Date/Time   NA 141 09/19/2014 2200   K 3.5* 09/19/2014 2200   CL 102 09/19/2014 2200   CO2 23 09/19/2014 2200   GLUCOSE 101* 09/19/2014 2200   BUN 8 09/19/2014 2200   CREATININE 0.87 09/19/2014 2200   CREATININE 0.99  09/02/2014 2014   CALCIUM 9.1 09/19/2014 2200   PROT 7.3 09/19/2014 2200   ALBUMIN 3.9 09/19/2014 2200   AST 18 09/19/2014 2200   ALT 15 09/19/2014 2200   ALKPHOS 71 09/19/2014 2200   BILITOT 0.4 09/19/2014 2200   GFRNONAA 83* 09/19/2014 2200   GFRAA >90 09/19/2014 2200   No results found for: CHOL, HDL, LDLCALC, LDLDIRECT, TRIG, CHOLHDL Lab Results  Component Value Date   HGBA1C 6.1 08/04/2014   No results found for: VITAMINB12 Lab Results  Component Value Date   TSH 1.250 09/13/2014      ASSESSMENT AND PLAN  Yulieth Carrender Turner is a 38 y.o. female complains of  Recurrent episode of heart racing fast, dizziness, essentially normal neurological examination, CAT scan of the brain,  1, differentiation diagnosis including cardiac arrhythmia, panic attack, mood disorder 2. She was started  BuSpar by her primary care recently 3. She has signs of obstructive sleep apnea, will refer her for sleep study, today's ESS score is 7, FSS scores 41 4. Return to clinic with nurse practitioner in 3 months  Marcial Pacas, M.D. Ph.D.  [facility] [address] [place], [place] [address] [phone]

## 2014-10-06 ENCOUNTER — Telehealth: Payer: Self-pay

## 2014-10-06 ENCOUNTER — Telehealth: Payer: Self-pay | Admitting: Neurology

## 2014-10-06 DIAGNOSIS — R002 Palpitations: Secondary | ICD-10-CM

## 2014-10-06 DIAGNOSIS — R0683 Snoring: Secondary | ICD-10-CM

## 2014-10-06 DIAGNOSIS — G4719 Other hypersomnia: Secondary | ICD-10-CM

## 2014-10-06 DIAGNOSIS — E669 Obesity, unspecified: Secondary | ICD-10-CM

## 2014-10-06 NOTE — Telephone Encounter (Signed)
Sleep study request review: This patient has an underlying medical history of obesity, reflux disease, diabetes, and tachycardia and is referred by Dr. Krista Blue for an attended sleep study due to a report of multiple nighttime awakenings, daytime somnolence, nighttime choking sensation and snoring. I will order a split-night sleep study and see the patient in sleep medicine consultation afterwards. Please print this note and attach to chart.   Technologist instructions: Please score at 4% and split if 2 hour estimated AHI >20/h.    Star Age, MD, PhD [facility])

## 2014-10-06 NOTE — Telephone Encounter (Signed)
Would rec pt come in for OV to discuss.  When she has her sleep study they will monitor her breathing and oxygen saturation all night and we can do pulmonary function testing in our office if she would like this. I think that it is very unlikely that a pulmonary referral would be of significant benefit to her at this point as her lung exam, respirations, and oxygen has always been normal.

## 2014-10-06 NOTE — Telephone Encounter (Signed)
Patient has continue to have shortness in breath and doesn't know why. Per patient she has already been seen at our clinic for this week before thanksgiving. Patient has had a chest xray done 2 1/2 weeks ago at [facility] and it was normal. Patient feels maybe she should be referred to a specialist. Patients call back number is [phone]

## 2014-10-06 NOTE — Telephone Encounter (Addendum)
Dr. Krista Blue, refers patient for attended sleep study.  Height: 5' 3.5"   Weight: 211 lbs  BMI: 36.79  Past Medical History:  Hypertension    . GERD (gastroesophageal reflux disease)   . Tachycardia   . Diabetes mellitus without complication      Sleep Symptoms: she complains of nighttime snoring, daytime excessive fatigue, and sleepiness, today's ESS score is 7, assess score is 41, she also complains of frequent nighttime awakening, heart burning, choking sensation   Epworth Score: (7)   Medication:  Acetaminophen (Tab) TYLENOL 500 MG Take 1,000 mg by mouth every 6 (six) hours as needed for mild pain.       BusPIRone HCl (Tab) BUSPAR 5 MG Take 1 tablet (5 mg total) by mouth 2 (two) times daily.      Fluticasone Propionate (Suspension) FLONASE 50 MCG/ACT Place 1 spray into both nostrils daily as needed for allergies or rhinitis.      LORazepam (Tab) ATIVAN 0.5 MG Take 0.25 mg by mouth every 8 (eight) hours as needed for anxiety or sleep.       Multiple Vitamins-Minerals   Take 2 tablets by mouth daily.      Omeprazole (Capsule Delayed Release) PRILOSEC 40 MG Take 1 capsule (40 mg total) by mouth daily. 30 minutes prior to dinner      Ins: Patent attorney & Plan:  Brooke Turner is a 38 y.o. female complains of Recurrent episode of heart racing fast, dizziness, essentially normal neurological examination, CAT scan of the brain,  1, differentiation diagnosis including cardiac arrhythmia, panic attack, mood disorder 2. She was started BuSpar by her primary care recently 3. She has signs of obstructive sleep apnea, will refer her for sleep study, today's ESS score is 7, FSS scores 41 4. Return to clinic with nurse practitioner in 3 months  Please review patient information and submit instructions for scheduling and orders for sleep technologist. Thank you.

## 2014-10-07 ENCOUNTER — Encounter: Payer: Self-pay | Admitting: *Deleted

## 2014-10-07 ENCOUNTER — Emergency Department (HOSPITAL_COMMUNITY)
Admission: EM | Admit: 2014-10-07 | Discharge: 2014-10-08 | Disposition: A | Discharge status: Home / Self Care | Payer: 59 | Attending: Emergency Medicine | Admitting: Emergency Medicine

## 2014-10-07 DIAGNOSIS — Z87891 Personal history of nicotine dependence: Secondary | ICD-10-CM | POA: Insufficient documentation

## 2014-10-07 DIAGNOSIS — T424X1A Poisoning by benzodiazepines, accidental (unintentional), initial encounter: Secondary | ICD-10-CM | POA: Diagnosis present

## 2014-10-07 DIAGNOSIS — E119 Type 2 diabetes mellitus without complications: Secondary | ICD-10-CM | POA: Diagnosis not present

## 2014-10-07 DIAGNOSIS — Z880 Allergy status to penicillin: Secondary | ICD-10-CM | POA: Diagnosis not present

## 2014-10-07 DIAGNOSIS — K219 Gastro-esophageal reflux disease without esophagitis: Secondary | ICD-10-CM | POA: Diagnosis not present

## 2014-10-07 DIAGNOSIS — Y9289 Other specified places as the place of occurrence of the external cause: Secondary | ICD-10-CM | POA: Insufficient documentation

## 2014-10-07 DIAGNOSIS — Y9389 Activity, other specified: Secondary | ICD-10-CM | POA: Diagnosis not present

## 2014-10-07 DIAGNOSIS — Z79899 Other long term (current) drug therapy: Secondary | ICD-10-CM | POA: Diagnosis not present

## 2014-10-07 DIAGNOSIS — I10 Essential (primary) hypertension: Secondary | ICD-10-CM | POA: Insufficient documentation

## 2014-10-07 DIAGNOSIS — Y998 Other external cause status: Secondary | ICD-10-CM | POA: Insufficient documentation

## 2014-10-07 DIAGNOSIS — F419 Anxiety disorder, unspecified: Secondary | ICD-10-CM | POA: Diagnosis not present

## 2014-10-07 NOTE — ED Notes (Addendum)
Pt reports mistakenly taking lorazepam 0.5mg  instead of her buspar tonight.  Pt is very anxious and crying in triage.  Pt is very scared concerned that mixing the 2 meds will make her too sedated where it will stop her heart from beating.  Pt reports SOB and chest tightness-pt is also very anxious.

## 2014-10-08 ENCOUNTER — Ambulatory Visit
Admission: RE | Admit: 2014-10-08 | Discharge: 2014-10-08 | Disposition: A | Discharge status: Home / Self Care | Payer: 59 | Source: Ambulatory Visit | Attending: Sports Medicine | Admitting: Sports Medicine

## 2014-10-08 ENCOUNTER — Other Ambulatory Visit: Payer: 59

## 2014-10-08 ENCOUNTER — Ambulatory Visit
Admission: RE | Admit: 2014-10-08 | Discharge: 2014-10-08 | Disposition: A | Discharge status: Home / Self Care | Payer: 59 | Source: Ambulatory Visit | Attending: Family Medicine | Admitting: Family Medicine

## 2014-10-08 DIAGNOSIS — N949 Unspecified condition associated with female genital organs and menstrual cycle: Secondary | ICD-10-CM

## 2014-10-08 DIAGNOSIS — M25531 Pain in right wrist: Secondary | ICD-10-CM

## 2014-10-08 MED ORDER — IOHEXOL 180 MG/ML  SOLN: 3.0000 mL | Freq: Once | INTRAMUSCULAR | Status: AC | PRN

## 2014-10-08 NOTE — Discharge Instructions (Signed)
Return to the ED with any concerns including difficulty breathing, fainting, thoughts or feelings of homicide or suicide, or any other alarming symptoms

## 2014-10-08 NOTE — ED Provider Notes (Signed)
CSN: [id number]     Arrival date & time 10/07/14  2208 History   First MD Initiated Contact with Patient 10/07/14 2331     Chief Complaint  Patient presents with  . Ingestion     (Consider location/radiation/quality/duration/timing/severity/associated sxs/prior Treatment) HPI  Pt presenting with c/o taking 0.5mg  lorazepam pill by mistake.  She was started on buspar twice daily in the past several weeks and took her buspar as prescribed this morning approx 11am.  Then this evening, instead of taking buspar, she accidentally took 0.5mg  lorazepam tablet.  After she realized this, she became very anxious about the side effects of being on both of these meds.  She began to feel short of breath and dizzy and felt her heart racing.  She has had multiple recent visits to the ED and has been seen by cardiology and neurology for her symptoms of dizziness, palpitations- workup negative thus far. .  Was recently started on the buspar to help with her anxiety symptoms.  There are no other associated systemic symptoms, there are no other alleviating or modifying factors.  She denies drinking alcohol or taking any other meds or illicit substances.   Took the ativan approx 1/2 hour prior to arrival in the ED.  No vomiting or significant somnolence.   Past Medical History  Diagnosis Date  . Hypertension   . GERD (gastroesophageal reflux disease)   . Tachycardia   . Diabetes mellitus without complication    Past Surgical History  Procedure Laterality Date  . Wisdom tooth extraction     Family History  Problem Relation Age of Onset  . Seizures Mother   . Cancer Father     Liver  . Diabetes Father 45  . Hypertension Father   . Heart disease Father 7    CEA, LE Stenting  . Alzheimer's disease Maternal Grandmother   . Heart disease Maternal Grandfather    History  Substance Use Topics  . Smoking status: Former Smoker    Types: Cigarettes    Quit date: 08/19/2014  . Smokeless tobacco: Never Used      Comment: Quit smoking last month  . Alcohol Use: 0.0 oz/week    0 Not specified per week     Comment: Usually 1 time a month   OB History    Gravida Para Term Preterm AB TAB SAB Ectopic Multiple Living   2 0 0 0 2 1 1 0 0 0      Review of Systems  ROS reviewed and all otherwise negative except for mentioned in HPI    Allergies  Penicillins and Tamiflu  Home Medications   Prior to Admission medications   Medication Sig Start Date End Date Taking? Authorizing Provider  acetaminophen (TYLENOL) 500 MG tablet Take 1,000 mg by mouth every 6 (six) hours as needed for mild pain.    Historical Provider, MD  busPIRone (BUSPAR) 5 MG tablet Take 1 tablet (5 mg total) by mouth 2 (two) times daily. 09/18/14   [name], MD  fluticasone (FLONASE) 50 MCG/ACT nasal spray Place 1 spray into both nostrils daily as needed for allergies or rhinitis.    Historical Provider, MD  LORazepam (ATIVAN) 0.5 MG tablet Take 0.25 mg by mouth every 8 (eight) hours as needed for anxiety or sleep.     Historical Provider, MD  Multiple Vitamins-Minerals (HAIR/SKIN/NAILS PO) Take 2 tablets by mouth daily.    Historical Provider, MD  omeprazole (PRILOSEC) 40 MG capsule Take 1 capsule (40 mg total)  by mouth daily. 30 minutes prior to dinner 09/29/14   [name], MD   BP 132/84 mmHg  Pulse 73  Temp(Src) 97.4 F (36.3 C) (Oral)  Resp 20  SpO2 100%  LMP 09/09/2014  Vitals reviewed Physical Exam  Physical Examination: General appearance - alert, anxious appearing, and in no distress Mental status - alert, oriented to person, place, and time Eyes - no scleral icterus, no conjunctival injection Mouth - mucous membranes moist, pharynx normal without lesions Chest - clear to auscultation, no wheezes, rales or rhonchi, symmetric air entry Heart - normal rate, regular rhythm, normal S1, S2, no murmurs, rubs, clicks or gallops Abdomen - soft, nontender, nondistended, no masses or organomegaly Extremities -  peripheral pulses normal, no pedal edema, no clubbing or cyanosis Skin - normal coloration and turgor, no rashes Psych- anxious appearing, intermittently tearful  ED Course  Procedures (including critical care time) Labs Review Labs Reviewed - No data to display  Imaging Review No results found.   EKG Interpretation None      MDM   Final diagnoses:  Anxiety    Pt presenting with c/o anxiety due accidentally taking lorazepam tonight instead of buspar.  Initially she was tachycardic, this resolved after patient was reassured that taking this medication was not life threatening.  She has no significant symptoms.  She was reassured, HR rechecked, encouraged to continue to f/u with her doctors on an outpatient basis as she is doing.  Discharged with strict return precautions.  Pt agreeable with plan.    Threasa Beards, MD 10/08/14 [phone]

## 2014-10-09 ENCOUNTER — Encounter: Payer: Self-pay | Admitting: Nurse Practitioner

## 2014-10-09 MED ORDER — ESOMEPRAZOLE MAGNESIUM 40 MG PO CPDR: 40.0000 mg | DELAYED_RELEASE_CAPSULE | Freq: Every day | ORAL | Status: DC

## 2014-10-09 NOTE — Addendum Note (Signed)
Addended by: [name] on: 10/09/2014 12:13 AM   Modules accepted: Orders, Medications

## 2014-10-11 ENCOUNTER — Ambulatory Visit (INDEPENDENT_AMBULATORY_CARE_PROVIDER_SITE_OTHER): Payer: 59 | Admitting: Family Medicine

## 2014-10-11 VITALS — BP 134/76 | HR 59 | Temp 98.6°F | Resp 20 | Ht 63.0 in | Wt 208.4 lb

## 2014-10-11 DIAGNOSIS — R2689 Other abnormalities of gait and mobility: Secondary | ICD-10-CM

## 2014-10-11 DIAGNOSIS — F413 Other mixed anxiety disorders: Secondary | ICD-10-CM

## 2014-10-11 DIAGNOSIS — R1310 Dysphagia, unspecified: Secondary | ICD-10-CM

## 2014-10-11 DIAGNOSIS — G44219 Episodic tension-type headache, not intractable: Secondary | ICD-10-CM

## 2014-10-11 DIAGNOSIS — R55 Syncope and collapse: Secondary | ICD-10-CM

## 2014-10-11 DIAGNOSIS — R072 Precordial pain: Secondary | ICD-10-CM

## 2014-10-11 NOTE — Progress Notes (Addendum)
Subjective:  This chart was scribed for Brooke Cheadle, MD by Brooke Turner, ED Scribe at [facility].The patient was seen in exam room 03 and the patient's care was started at 3:16 PM.   Patient ID: Brooke Turner, female    DOB: 1976/07/10, 38 y.o.   MRN: [id number]   Chief Complaint  Patient presents with  . Follow-up    check lungs  . Shortness of Breath    x 3 weeks  . Dizziness    1 month, lightheaded  . Chest Pain    sharp pain that comes and goes x 3 days  . Headache    more in the am after waking, past couple of days, blurry vision   HPI HPI Comments: Brooke Turner is a 39 y.o. female who presents to [facility] complaining of SOB, dizziness, CP and HA.  Pt states in the morning this past week when she wakes up she has HA in the back of her head and neck. She does not believe she has been drinking enough of water.   Pt takes 40 mg nexium and it has helped with her acid reflux, she has decreased appetite. However she has trouble swallowing because she feels food/liquid gets stuck in her throat. She has this intermittent swallowing issue every other day. It is not painful but she feels a momentary choking sensation.  Pt has nasal congestion and some sinus pressure with a lot of mucosal discharge but she does not have a cough. She has no humidifier in her room but does have a heater. Pt has not been using her flonase.  Her right wrist has been bothering her for the past year and the orthopedist said it maybe because of inflammation of the wrist due to overuse.   Pt has a sensation of dizziness and light headedness but not room-spinning. Pt states when she is walking in her kitchen she has a sensation of imbalance.  Pt believes it might be a vestibular system disorder.   Last seen in the ER 4 days ago. Pt took 0.5 mg lorazepam accidentally and had a panic attack. She went to th ER because she was concerned with the medication interactions.   Last  couple of days she has experienced intermittent blurry vision when reading, writing, or on the computer. At night she notices lights are blurry. Pt has not seen an optometrist.   Pt will take a pulmonary function test today.  She has been taking her Buspar as prescribed BID. Pt says the Buspar makes her very drowsy.  3 days ago she saw Dr. Alfonso Ramus for right wrist pain which showed tenosynovitis.  Pt will set up an appointment with a therapist.   Patient Active Problem List   Diagnosis Date Noted  . Snorings 09/30/2014  . Fatigue 09/26/2014  . Dizziness and giddiness 09/11/2014  . Tachycardia 09/11/2014  . Precordial chest pain 09/11/2014  . Prediabetes 08/26/2014  . Acanthosis nigricans 03/19/2014  . Type II or unspecified type diabetes mellitus without mention of complication, not stated as uncontrolled 07/15/2013  . Vitamin D deficiency 07/15/2013  . Large breasts 09/12/2012  . HTN (hypertension) 02/17/2012  . Contraception management 02/17/2012  . Obesity, Class III, BMI 40-49.9 (morbid obesity) 02/17/2012  . Anxiety disorder 02/17/2012  . Chronic back pain 02/17/2012   Past Medical History  Diagnosis Date  . Hypertension   . GERD (gastroesophageal reflux disease)   . Tachycardia   . Diabetes mellitus without complication   .  Fibroid uterus    Past Surgical History  Procedure Laterality Date  . Wisdom tooth extraction     Allergies  Allergen Reactions  . Penicillins Hives  . Tamiflu [Oseltamivir Phosphate] Nausea And Vomiting   Prior to Admission medications   Medication Sig Start Date End Date Taking? Authorizing Provider  acetaminophen (TYLENOL) 500 MG tablet Take 1,000 mg by mouth every 6 (six) hours as needed for mild pain.   Yes Historical Provider, MD  busPIRone (BUSPAR) 5 MG tablet Take 1 tablet (5 mg total) by mouth 2 (two) times daily. 09/18/14  Yes [name], MD  esomeprazole (NEXIUM) 40 MG capsule Take 1 capsule (40 mg total) by mouth daily. 10/09/14   Yes [name], MD  fluticasone (FLONASE) 50 MCG/ACT nasal spray Place 1 spray into both nostrils daily as needed for allergies or rhinitis.   Yes Historical Provider, MD  LORazepam (ATIVAN) 0.5 MG tablet Take 0.25 mg by mouth every 8 (eight) hours as needed for anxiety or sleep.    Yes Historical Provider, MD  Multiple Vitamins-Minerals (HAIR/SKIN/NAILS PO) Take 2 tablets by mouth daily.   Yes Historical Provider, MD   History   Social History  . Marital Status: Single    Spouse Name: N/A    Number of Children: 0  . Years of Education: 16   Occupational History  . Customer Service [employer]   Social History Main Topics  . Smoking status: Former Smoker    Types: Cigarettes    Quit date: 08/19/2014  . Smokeless tobacco: Never Used     Comment: Quit smoking last month  . Alcohol Use: 0.0 oz/week    0 Not specified per week     Comment: Usually 1 time a month  . Drug Use: No  . Sexual Activity:    Partners: Male    Birth Control/ Protection: Other-see comments     Comment: NuvaRing   Other Topics Concern  . Not on file   Social History Narrative   Patient lives at home alone .   Patient works full time at [employer]   Education college.   Right handed.   Caffeine none   Review of Systems  HENT: Positive for postnasal drip, sinus pressure and trouble swallowing.   Neurological: Positive for dizziness, light-headedness and headaches.      Objective:  BP 134/76 mmHg  Pulse 59  Temp(Src) 98.6 F (37 C) (Oral)  Resp 20  Ht 5\' 3"  (1.6 m)  Wt 208 lb 6.4 oz (94.53 kg)  BMI 36.93 kg/m2  SpO2 100%  LMP 10/10/2014  Physical Exam  Constitutional: She is oriented to person, place, and time. She appears well-developed and well-nourished. No distress.  HENT:  Head: Normocephalic and atraumatic.  Eyes: EOM are normal.  Neck: Normal range of motion.  Cardiovascular: Normal rate.   Pulmonary/Chest: Effort normal.  Neurological: She is alert and oriented to person,  place, and time.  Skin: Skin is warm and dry.  Psychiatric: She has a normal mood and affect. Her behavior is normal.  Nursing note and vitals reviewed.      Visual Acuity Screening   Right eye Left eye Both eyes  Without correction: 20/15 20/15 20/15   With correction:     Comments: Peripheral Vision: Right eye 85 degrees. Left eye 85 degrees.  Near vision Both 20/20 Left 20/22 Right 20/22    Assessment & Plan:  Episodic tension-type headache, not intractable  Other mixed anxiety disorders - again encouraged  pt to sched w/ therapist and/or psychiatrist - check through work [employer].  Pt has had an extensive but normal workup with countless ER visits over the past several months as her sxs have developed and are worsening. Pt again encouraged to take FMLA leave for 1-2 mos and use this time to spend at her parents (I think in [place]) - esp as her job performance has sig worsened due to sxs and supervisor is aware.  I encouraged her to try to find a psychiatrist that she can work with asap while in [place].  Pt does have a sleep study pending as well as ENT and eye doctor visits here in sev wks which she will return to [facility] for briefly.  It does seem like pt's anxiety are triggering these widespread somatic symptoms.  Pt has admitted that much of her anxiety is being triggered by her concern that she will have a medical emergency causing syncope and collapse when she is alone in her apt and not be able to call 911 and thereby suffer sig morbidity/mortality as pt lives alone. I am really hoping that when she is living with her parents over the next 1-2 months, her sxs will spontaneously regress as her subconscious realizes that her parents would be able to intervene if collapse or medical emergency did ocur while she is living in their house. However, I did discuss with pt that if her sxs regress while living with her parents there is a possibility that they could return when she comes back to [place] and lives  alone again. Pt encouraged to wean up on buspar but may need to consider changing to ssri - citalopram.  Dysphagia - ENT eval P - rec continuing on ppi - could consider barium swallow if persists or worsens.  Precordial pain  Pre-syncope  Imbalance  Vision change - pt advised to sched appt with optometrist to further eval sxs as near, far, and peripheral vision are all normal today  I personally performed the services described in this documentation, which was scribed in my presence. The recorded information has been reviewed and considered, and addended by me as needed.  Brooke Cheadle, MD MPH  Over 40 min spent in face-to-face evaluation of and consultation with patient and coordination of care.

## 2014-10-14 ENCOUNTER — Telehealth: Payer: Self-pay

## 2014-10-14 NOTE — Telephone Encounter (Signed)
Patient requesting a refill on "flonase". Patient requesting it to be sent [facility] on [address]. Patiens call back number is [phone]

## 2014-10-15 MED ORDER — FLUTICASONE PROPIONATE 50 MCG/ACT NA SUSP: 1.0000 | Freq: Every day | NASAL | Status: DC | PRN

## 2014-10-15 NOTE — Telephone Encounter (Signed)
Pt.notified

## 2014-10-15 NOTE — Telephone Encounter (Signed)
Meds ordered this encounter  Medications  . fluticasone (FLONASE) 50 MCG/ACT nasal spray    Sig: Place 1 spray into both nostrils daily as needed for allergies or rhinitis.    Dispense:  16 g    Refill:  12    Order Specific Question:  Supervising Provider    Answer:  Laney Pastor, ROBERT P [[id number]]

## 2014-10-17 ENCOUNTER — Ambulatory Visit: Payer: 59 | Admitting: Cardiology

## 2014-10-18 ENCOUNTER — Telehealth: Payer: Self-pay

## 2014-10-18 DIAGNOSIS — H539 Unspecified visual disturbance: Secondary | ICD-10-CM

## 2014-10-18 NOTE — Telephone Encounter (Signed)
I don't see a referral to an eye specialist. Is there anything I can do for you. Please advise.

## 2014-10-18 NOTE — Telephone Encounter (Signed)
The patient was calling to give Dr. Brigitte Pulse a follow-up about her condition, and to ask about the status on her referral to an eye specialist.  She said that her issue with her vision, the dimness, is consistent.  She said that she is also experiencing worsening dizziness and facial pressure, but she believes that may be a sinus infection.  The patient requested a call back about the referral status.  CB#: [phone]

## 2014-10-19 NOTE — Telephone Encounter (Signed)
Left message on machine to call back  

## 2014-10-19 NOTE — Telephone Encounter (Signed)
Placed referral now but insurance does not normally require a referral for an eye doctor visit so I had thought she was just going to schedule herself. If pt has an eye doctor she can go there. If not, I recommend [facility] (which is optometry and so processed under vision insurance.) She can just call them and schedule a visit for a vision check.  If she prefers a medical eye doctor Dr. Katy Fitch, Dr. Gershon Crane, or Dr. Payton Emerald office are all great.

## 2014-10-20 ENCOUNTER — Encounter: Payer: Self-pay | Admitting: *Deleted

## 2014-10-20 DIAGNOSIS — K2100 Gastro-esophageal reflux disease with esophagitis, without bleeding: Secondary | ICD-10-CM

## 2014-10-20 DIAGNOSIS — E119 Type 2 diabetes mellitus without complications: Secondary | ICD-10-CM

## 2014-10-20 DIAGNOSIS — F4322 Adjustment disorder with anxiety: Secondary | ICD-10-CM

## 2014-10-20 DIAGNOSIS — E559 Vitamin D deficiency, unspecified: Secondary | ICD-10-CM

## 2014-10-20 DIAGNOSIS — R1310 Dysphagia, unspecified: Secondary | ICD-10-CM

## 2014-10-20 DIAGNOSIS — I10 Essential (primary) hypertension: Secondary | ICD-10-CM

## 2014-10-20 DIAGNOSIS — E876 Hypokalemia: Secondary | ICD-10-CM

## 2014-10-20 NOTE — Telephone Encounter (Signed)
Sent pt Mychart message

## 2014-10-22 ENCOUNTER — Other Ambulatory Visit: Payer: Self-pay | Admitting: Family Medicine

## 2014-10-22 MED ORDER — BUSPIRONE HCL 5 MG PO TABS: 5.0000 mg | ORAL_TABLET | Freq: Three times a day (TID) | ORAL | Status: DC

## 2014-10-27 ENCOUNTER — Telehealth: Payer: Self-pay | Admitting: *Deleted

## 2014-10-27 ENCOUNTER — Other Ambulatory Visit: Payer: Self-pay | Admitting: Family Medicine

## 2014-10-27 NOTE — Telephone Encounter (Signed)
I was wondering if you could refax my fmla paperwork to attention Norva Karvonen at [employer]...they just need the one page that indicates how much time I can take off due to incapacity per week. Currently Dr. Brigitte Pulse has it at 3 times per week for up to eight hours each time but I am out more than 3 times, usually I'm late in the mornings and sometimes have to leave early which count as 2 per day, and the leave manager Jonni Sanger at [place] states that it needs to be modified to around 10 times per week up to 8 hours each time. I'm also going to set up a separate leave for short term disability out of work as Dr. Brigitte Pulse and I discussed, & Christella Scheuermann will fax over paperwork for that this week, with the leave start date beginning 10/29/14-12/22/13 for now so I can go to [place], come back to complete other testing scheduled for the 2nd week in January(sleep study, ent, eye), and then go back to [place] for a while. Thank you and have a great holiday!

## 2014-10-29 ENCOUNTER — Other Ambulatory Visit: Payer: 59

## 2014-10-29 DIAGNOSIS — F4322 Adjustment disorder with anxiety: Secondary | ICD-10-CM

## 2014-10-29 DIAGNOSIS — K2100 Gastro-esophageal reflux disease with esophagitis, without bleeding: Secondary | ICD-10-CM

## 2014-10-29 DIAGNOSIS — I10 Essential (primary) hypertension: Secondary | ICD-10-CM

## 2014-10-29 DIAGNOSIS — E119 Type 2 diabetes mellitus without complications: Secondary | ICD-10-CM

## 2014-10-29 DIAGNOSIS — E559 Vitamin D deficiency, unspecified: Secondary | ICD-10-CM

## 2014-10-29 DIAGNOSIS — E876 Hypokalemia: Secondary | ICD-10-CM

## 2014-10-29 NOTE — Addendum Note (Signed)
Addended by: [name] on: 10/29/2014 06:16 AM   Modules accepted: Orders

## 2014-10-30 LAB — COMPREHENSIVE METABOLIC PANEL
ALT: 11 U/L (ref 0–35)
AST: 17 U/L (ref 0–37)
Albumin: 4.3 g/dL (ref 3.5–5.2)
Alkaline Phosphatase: 58 U/L (ref 39–117)
BUN: 10 mg/dL (ref 6–23)
CO2: 22 mEq/L (ref 19–32)
Calcium: 9.4 mg/dL (ref 8.4–10.5)
Chloride: 103 mEq/L (ref 96–112)
Creat: 0.86 mg/dL (ref 0.50–1.10)
Glucose, Bld: 87 mg/dL (ref 70–99)
Potassium: 4 mEq/L (ref 3.5–5.3)
Sodium: 138 mEq/L (ref 135–145)
Total Bilirubin: 0.9 mg/dL (ref 0.2–1.2)
Total Protein: 7 g/dL (ref 6.0–8.3)

## 2014-10-30 LAB — CBC WITH DIFFERENTIAL/PLATELET
Basophils Absolute: 0 10*3/uL (ref 0.0–0.1)
Basophils Relative: 0 % (ref 0–1)
Eosinophils Absolute: 0.1 10*3/uL (ref 0.0–0.7)
Eosinophils Relative: 1 % (ref 0–5)
HCT: 39.6 % (ref 36.0–46.0)
Hemoglobin: 13.3 g/dL (ref 12.0–15.0)
Lymphocytes Relative: 32 % (ref 12–46)
Lymphs Abs: 2.8 10*3/uL (ref 0.7–4.0)
MCH: 29.9 pg (ref 26.0–34.0)
MCHC: 33.6 g/dL (ref 30.0–36.0)
MCV: 89 fL (ref 78.0–100.0)
MPV: 9.3 fL — ABNORMAL LOW (ref 9.4–12.4)
Monocytes Absolute: 0.8 10*3/uL (ref 0.1–1.0)
Monocytes Relative: 9 % (ref 3–12)
Neutro Abs: 5.1 10*3/uL (ref 1.7–7.7)
Neutrophils Relative %: 58 % (ref 43–77)
Platelets: 308 10*3/uL (ref 150–400)
RBC: 4.45 MIL/uL (ref 3.87–5.11)
RDW: 13.2 % (ref 11.5–15.5)
WBC: 8.8 10*3/uL (ref 4.0–10.5)

## 2014-10-30 LAB — HEMOGLOBIN A1C
Hgb A1c MFr Bld: 6.2 % — ABNORMAL HIGH (ref <5.7)
Mean Plasma Glucose: 131 mg/dL — ABNORMAL HIGH (ref <117)

## 2014-10-30 LAB — TSH: TSH: 1.048 u[IU]/mL (ref 0.350–4.500)

## 2014-10-30 LAB — MAGNESIUM: Magnesium: 1.6 mg/dL (ref 1.5–2.5)

## 2014-10-30 LAB — PHOSPHORUS: Phosphorus: 4.2 mg/dL (ref 2.3–4.6)

## 2014-10-30 LAB — VITAMIN B12: Vitamin B-12: 396 pg/mL (ref 211–911)

## 2014-10-31 LAB — VITAMIN D 25 HYDROXY (VIT D DEFICIENCY, FRACTURES): Vit D, 25-Hydroxy: 16 ng/mL — ABNORMAL LOW (ref 30–100)

## 2014-11-02 MED ORDER — ERGOCALCIFEROL 1.25 MG (50000 UT) PO CAPS: 50000.0000 [IU] | ORAL_CAPSULE | ORAL | Status: DC

## 2014-11-02 NOTE — Addendum Note (Signed)
Addended by: [name] on: 11/02/2014 07:22 PM   Modules accepted: Orders

## 2014-11-03 LAB — H. PYLORI ANTIBODY, IGG: H Pylori IgG: <0.4 {ISR}

## 2014-11-18 ENCOUNTER — Ambulatory Visit (INDEPENDENT_AMBULATORY_CARE_PROVIDER_SITE_OTHER): Payer: 59 | Admitting: Neurology

## 2014-11-18 ENCOUNTER — Telehealth: Payer: Self-pay

## 2014-11-18 DIAGNOSIS — G479 Sleep disorder, unspecified: Secondary | ICD-10-CM

## 2014-11-18 DIAGNOSIS — R0683 Snoring: Secondary | ICD-10-CM

## 2014-11-18 DIAGNOSIS — G4761 Periodic limb movement disorder: Secondary | ICD-10-CM

## 2014-11-18 DIAGNOSIS — G478 Other sleep disorders: Secondary | ICD-10-CM

## 2014-11-18 NOTE — Telephone Encounter (Signed)
Dr Brigitte Pulse   Brooke Turner (Nurse Case Management) is helping the patient with her short term disability and has questions for DR. SHAW.   [phone]

## 2014-11-18 NOTE — Telephone Encounter (Signed)
lmom for [employer] to return call

## 2014-11-18 NOTE — Sleep Study (Signed)
Please see the scanned sleep study interpretation located in the Procedure tab within the Chart Review section. 

## 2014-11-19 NOTE — Telephone Encounter (Signed)
Spoke to Adam Phenix, she will be faxing a letter to Dr Brigitte Pulse with the questions she will need to have answered to complete her FMLA paperwork

## 2014-11-20 ENCOUNTER — Encounter: Payer: Self-pay | Admitting: Family Medicine

## 2014-11-20 ENCOUNTER — Telehealth: Payer: Self-pay | Admitting: Family Medicine

## 2014-11-20 NOTE — Telephone Encounter (Addendum)
Patient brought in FMLA forms on 11/19/2014 to be completed. I have filled in all necessary parts and placed in Dr. Raul Del box for her review and signature. Please return to [facility] upon signature.Patient has requested that we update the start and end dates for her STD paperwork as well (10/30/14-01/19/15). Patient states that Christella Scheuermann is faxing this paperwork. When it is received we can update the information and fax it back to them. Patient also stated that her heart has been fluttering and it is waking her up at night. She is concerned about what the cause could be. Please advise.

## 2014-11-20 NOTE — Telephone Encounter (Signed)
[employer] forms reviewed and completed and returned the the fmla slot in front. Jasmine did an AMAZING job using prior forms and chart notes to answer the questions completely accurately - THANKS JASMINE.

## 2014-11-21 ENCOUNTER — Encounter: Payer: Self-pay | Admitting: Cardiology

## 2014-11-21 ENCOUNTER — Other Ambulatory Visit: Payer: Self-pay | Admitting: Family Medicine

## 2014-11-21 ENCOUNTER — Other Ambulatory Visit (INDEPENDENT_AMBULATORY_CARE_PROVIDER_SITE_OTHER): Payer: 59 | Admitting: Family Medicine

## 2014-11-21 ENCOUNTER — Encounter: Payer: Self-pay | Admitting: Family Medicine

## 2014-11-21 ENCOUNTER — Ambulatory Visit (INDEPENDENT_AMBULATORY_CARE_PROVIDER_SITE_OTHER): Payer: 59 | Admitting: Cardiology

## 2014-11-21 VITALS — BP 139/83 | HR 68 | Ht 63.0 in | Wt 205.5 lb

## 2014-11-21 DIAGNOSIS — R0600 Dyspnea, unspecified: Secondary | ICD-10-CM

## 2014-11-21 DIAGNOSIS — G479 Sleep disorder, unspecified: Secondary | ICD-10-CM

## 2014-11-21 DIAGNOSIS — R002 Palpitations: Secondary | ICD-10-CM

## 2014-11-21 DIAGNOSIS — R0602 Shortness of breath: Secondary | ICD-10-CM

## 2014-11-21 DIAGNOSIS — R0789 Other chest pain: Secondary | ICD-10-CM

## 2014-11-21 LAB — COMPREHENSIVE METABOLIC PANEL
ALT: 10 U/L (ref 0–35)
AST: 15 U/L (ref 0–37)
Albumin: 4 g/dL (ref 3.5–5.2)
Alkaline Phosphatase: 58 U/L (ref 39–117)
BUN: 14 mg/dL (ref 6–23)
CO2: 26 mEq/L (ref 19–32)
Calcium: 9 mg/dL (ref 8.4–10.5)
Chloride: 103 mEq/L (ref 96–112)
Creat: 0.88 mg/dL (ref 0.50–1.10)
Glucose, Bld: 100 mg/dL — ABNORMAL HIGH (ref 70–99)
Potassium: 4.2 mEq/L (ref 3.5–5.3)
Sodium: 137 mEq/L (ref 135–145)
Total Bilirubin: 1 mg/dL (ref 0.2–1.2)
Total Protein: 6.6 g/dL (ref 6.0–8.3)

## 2014-11-21 MED ORDER — DILTIAZEM HCL ER COATED BEADS 120 MG PO CP24: 120.0000 mg | ORAL_CAPSULE | Freq: Every day | ORAL | Status: DC

## 2014-11-21 NOTE — Telephone Encounter (Signed)
Discussed pt's status with her via phone evening of 1/14 - she states that her symtpoms did decrease a little when she was in [place] - is now back in [place] for further testing and states that her sxs are again worsening. She reports waking from deep sleep several times every night with her heart racing and feeling short of breath. Reports that she does not dream prior to these episodes and since she is asleep feels very sure that it is not panic/anxiety induced, also feels strongly that it is the palpitations that wake her from sleep - nothing else.  Had her sleep study done 2d ago - states that the tech told her that she had no witnessed apnea at all but pt reports that during the study she did awaken with her heart racing and feeling short of breath so is hoping the neurologist will be able to tell her what is happening to her body during this episode.  She has a f/u appt w/ cardiology today to review the results of the event monitor which also captured many of these episodes. Pt does mention that her mother had some sort of abdominal mass that her urologist found years ago - pt is not sure what - thinks it could have been a tumor of her liver or kidneys but mother never required any surgery/chemo/rad and she is fine now. - Pt requests referral to her mother's urologist in [place] for her own eval.  Pt advised that I will be happy to refer her wherever she wants to go but would be much more cost and time-efficient for her to find exactly what her mother had - perhaps it was an adrenal adenoma??  We have not tested pt for adrenal insufficiency prior as her electrolytes have always been normal but as pt's symptoms are so debilitating, not improving, and no etiology can be found other than anxiety (which pt refuses to accept as responsible for all of her symptoms) I do not think it would be unreasonable to consider getting a CT of her chest/abd/pelvis. Will consider further after pt comes in 1/15 a.m. For fasting labs  (cortisol, ACTH, repeat electrolytes, etc) - future orders entered.

## 2014-11-21 NOTE — Progress Notes (Signed)
HPI The patient presents for evaluation of dizziness and presyncope as well as chest pain. Her recent symptoms are extensively outlined in the previous note. At the end of the last visit I did set her up to have a 21 day event monitor. I reviewed this and went over the results with her. She had predominantly sinus rhythm. There was some occasional sinus tachycardia with rates of about 100. She said these happened to her when she was at rest. There were rare ectopic beats but no significant sustained dysrhythmias. An echocardiogram was unremarkable. She continues unfortunately to have symptoms as described. She feels her heart pounding. She does have occasional chest discomfort. These don't necessarily go together. She has had a recent sleep study and is awaiting these results. She said she had one of her episodes of rapid heart rate during this sleep study but I don't see any report yet on this. She's not describing presyncope or syncope. She's not describing PND or orthopnea. She doesn't think she can trigger her events. She doesn't report any anxiety.  Allergies  Allergen Reactions  . Penicillins Hives  . Tamiflu [Oseltamivir Phosphate] Nausea And Vomiting    Current Outpatient Prescriptions  Medication Sig Dispense Refill  . busPIRone (BUSPAR) 5 MG tablet Take 1 tablet (5 mg total) by mouth 3 (three) times daily. 90 tablet 0  . ergocalciferol (VITAMIN D2) 50000 UNITS capsule Take 1 capsule (50,000 Units total) by mouth once a week. 12 capsule 1  . esomeprazole (NEXIUM) 40 MG capsule Take 1 capsule (40 mg total) by mouth daily. 30 capsule 3  . fluticasone (FLONASE) 50 MCG/ACT nasal spray Place 1 spray into both nostrils daily as needed for allergies or rhinitis. 16 g 12  . VENTOLIN HFA 108 (90 BASE) MCG/ACT inhaler Inhale 1 puff into the lungs as needed.   0   No current facility-administered medications for this visit.    Past Medical History  Diagnosis Date  . Hypertension   . GERD  (gastroesophageal reflux disease)   . Tachycardia   . Diabetes mellitus without complication   . Fibroid uterus     Past Surgical History  Procedure Laterality Date  . Wisdom tooth extraction       ROS:  As stated in the HPI and negative for all other systems.  PHYSICAL EXAM BP 139/83 mmHg  Pulse 68  Ht 5\' 3"  (1.6 m)  Wt 205 lb 8 oz (93.214 kg)  BMI 36.41 kg/m2  GENERAL:  Well appearing NECK:  No jugular venous distention, waveform within normal limits, carotid upstroke brisk and symmetric, no bruits, no thyromegaly LUNGS:  Clear to auscultation bilaterally BACK:  No CVA tenderness CHEST:  Unremarkable HEART:  PMI not displaced or sustained,S1 and S2 within normal limits, no S3, no S4, no clicks, no rubs, no murmurs ABD:  Flat, positive bowel sounds normal in frequency in pitch, no bruits, no rebound, no guarding, no midline pulsatile mass, no hepatomegaly, no splenomegaly EXT:  2 plus pulses throughout, no edema, no cyanosis no clubbing  EKG:  Sinus rhythm, rate 70, axis within normal limits, intervals within normal limits, no acute ST-T wave changes.  11/21/2014   ASSESSMENT AND PLAN  TACHYCARDIA/PRESYNCOPE:  I don't see a clear culprit dysrhythmia at this point. She does have some sinus tachycardia but rates are very close to 100. At this point I'm going to symptomatically try to treat her with a low dose of calcium blocker. She did say that she had some improvement  on beta blockers in the past but one of her doctors stopped this during the evaluation. She does have some mild asthma so I will pick a calcium channel blocker.  CHEST PAIN:  I have a low suspicion of obstructive coronary disease. I will screen her with a POET (Plain Old Exercise Treadmill).

## 2014-11-21 NOTE — Patient Instructions (Signed)
Your physician recommends that you schedule a follow-up appointment in: as needed with Dr. Percival Spanish  We are ordering a treadmill test for you to get done  We are starting you on cardizem 120 mg daily after you have your stress tset

## 2014-11-22 LAB — CORTISOL-AM, BLOOD: Cortisol - AM: 10.4 ug/dL (ref 4.3–22.4)

## 2014-11-23 ENCOUNTER — Emergency Department (HOSPITAL_COMMUNITY)
Admission: EM | Admit: 2014-11-23 | Discharge: 2014-11-24 | Disposition: A | Discharge status: Home / Self Care | Payer: 59 | Attending: Emergency Medicine | Admitting: Emergency Medicine

## 2014-11-23 ENCOUNTER — Encounter (HOSPITAL_COMMUNITY): Payer: Self-pay | Admitting: Emergency Medicine

## 2014-11-23 ENCOUNTER — Emergency Department (HOSPITAL_COMMUNITY): Payer: 59

## 2014-11-23 DIAGNOSIS — M79605 Pain in left leg: Secondary | ICD-10-CM

## 2014-11-23 DIAGNOSIS — R002 Palpitations: Secondary | ICD-10-CM | POA: Diagnosis not present

## 2014-11-23 DIAGNOSIS — M545 Low back pain: Secondary | ICD-10-CM | POA: Insufficient documentation

## 2014-11-23 DIAGNOSIS — I10 Essential (primary) hypertension: Secondary | ICD-10-CM | POA: Diagnosis not present

## 2014-11-23 DIAGNOSIS — E119 Type 2 diabetes mellitus without complications: Secondary | ICD-10-CM | POA: Insufficient documentation

## 2014-11-23 DIAGNOSIS — Z87891 Personal history of nicotine dependence: Secondary | ICD-10-CM | POA: Diagnosis not present

## 2014-11-23 DIAGNOSIS — K219 Gastro-esophageal reflux disease without esophagitis: Secondary | ICD-10-CM | POA: Insufficient documentation

## 2014-11-23 DIAGNOSIS — R0789 Other chest pain: Secondary | ICD-10-CM | POA: Diagnosis not present

## 2014-11-23 DIAGNOSIS — R079 Chest pain, unspecified: Secondary | ICD-10-CM | POA: Diagnosis present

## 2014-11-23 DIAGNOSIS — F419 Anxiety disorder, unspecified: Secondary | ICD-10-CM | POA: Insufficient documentation

## 2014-11-23 DIAGNOSIS — R0602 Shortness of breath: Secondary | ICD-10-CM | POA: Insufficient documentation

## 2014-11-23 DIAGNOSIS — R1084 Generalized abdominal pain: Secondary | ICD-10-CM | POA: Diagnosis not present

## 2014-11-23 DIAGNOSIS — Z3202 Encounter for pregnancy test, result negative: Secondary | ICD-10-CM | POA: Insufficient documentation

## 2014-11-23 DIAGNOSIS — N939 Abnormal uterine and vaginal bleeding, unspecified: Secondary | ICD-10-CM | POA: Diagnosis not present

## 2014-11-23 DIAGNOSIS — Z880 Allergy status to penicillin: Secondary | ICD-10-CM | POA: Insufficient documentation

## 2014-11-23 DIAGNOSIS — Z793 Long term (current) use of hormonal contraceptives: Secondary | ICD-10-CM | POA: Diagnosis not present

## 2014-11-23 DIAGNOSIS — Z79899 Other long term (current) drug therapy: Secondary | ICD-10-CM | POA: Diagnosis not present

## 2014-11-23 LAB — I-STAT TROPONIN, ED: Troponin i, poc: 0 ng/mL (ref 0.00–0.08)

## 2014-11-23 LAB — CBC
HCT: 37.8 % (ref 36.0–46.0)
Hemoglobin: 13 g/dL (ref 12.0–15.0)
MCH: 30.1 pg (ref 26.0–34.0)
MCHC: 34.4 g/dL (ref 30.0–36.0)
MCV: 87.5 fL (ref 78.0–100.0)
Platelets: 273 10*3/uL (ref 150–400)
RBC: 4.32 MIL/uL (ref 3.87–5.11)
RDW: 12.8 % (ref 11.5–15.5)
WBC: 10.8 10*3/uL — ABNORMAL HIGH (ref 4.0–10.5)

## 2014-11-23 LAB — BASIC METABOLIC PANEL
Anion gap: 2 — ABNORMAL LOW (ref 5–15)
BUN: 13 mg/dL (ref 6–23)
CO2: 26 mmol/L (ref 19–32)
Calcium: 8.7 mg/dL (ref 8.4–10.5)
Chloride: 107 mEq/L (ref 96–112)
Creatinine, Ser: 0.86 mg/dL (ref 0.50–1.10)
GFR calc Af Amer: >90 mL/min (ref >90)
GFR calc non Af Amer: 84 mL/min — ABNORMAL LOW (ref >90)
Glucose, Bld: 109 mg/dL — ABNORMAL HIGH (ref 70–99)
Potassium: 3.7 mmol/L (ref 3.5–5.1)
Sodium: 135 mmol/L (ref 135–145)

## 2014-11-23 NOTE — ED Notes (Signed)
Writer called lab for add-on D-dimer.

## 2014-11-23 NOTE — ED Provider Notes (Signed)
CSN: [id number]     Arrival date & time 11/23/14  2102 History   First MD Initiated Contact with Patient 11/23/14 2329     Chief Complaint  Patient presents with  . Chest Pain  . Generalized Body Aches     (Consider location/radiation/quality/duration/timing/severity/associated sxs/prior Treatment) HPI   Patient presents with multiple complaints.  States that for the past two days she has had left sided chest pain that is described as dull and tight, worse with deep inspiration nad with palpation.  Associated SOB.  Denies fevers, cough, hemoptysis, leg swelling.  Has driven to and from [place] in the past few weeks but notes she stops every 2 hours.  She is on oral contraceptives but states they do not contain estrogen.    Also notes left inguinal and left upper thigh pain that began when she stood up yesterday upon waking. Pain was 10/10 yesterday and 5/10 today.  Exacerbated by movement and palpation.  Denies injury.  Denies weakness or numbness of the leg.     Past Medical History  Diagnosis Date  . Hypertension   . GERD (gastroesophageal reflux disease)   . Tachycardia   . Diabetes mellitus without complication   . Fibroid uterus    Past Surgical History  Procedure Laterality Date  . Wisdom tooth extraction     Family History  Problem Relation Age of Onset  . Seizures Mother   . Cancer Father     Liver  . Diabetes Father 47  . Hypertension Father   . Heart disease Father 58    CEA, LE Stenting  . Alzheimer's disease Maternal Grandmother   . Heart disease Maternal Grandfather    History  Substance Use Topics  . Smoking status: Former Smoker    Types: Cigarettes    Quit date: 08/19/2014  . Smokeless tobacco: Never Used     Comment: Quit smoking last month  . Alcohol Use: 0.0 oz/week    0 Not specified per week     Comment: Usually 1 time a month   OB History    Gravida Para Term Preterm AB TAB SAB Ectopic Multiple Living   2 0 0 0 2 1 1 0 0 0      Review of  Systems  Constitutional: Negative for fever and chills.  Respiratory: Positive for shortness of breath. Negative for cough.   Cardiovascular: Positive for chest pain and palpitations (Not currently.  Ongoing issue.  Has seen cardiology two days ago for this.  ). Negative for leg swelling.  Gastrointestinal: Negative for nausea, vomiting, abdominal pain and diarrhea.  Genitourinary: Positive for vaginal bleeding (ongoing x months, has seen GYN for this) and menstrual problem. Negative for dysuria, urgency, frequency and vaginal discharge.  Musculoskeletal: Positive for back pain.  Neurological: Positive for light-headedness (Not currently but has been ongoing, has seen cardiology two days ago for this).      Allergies  Penicillins and Tamiflu  Home Medications   Prior to Admission medications   Medication Sig Start Date End Date Taking? Authorizing Provider  busPIRone (BUSPAR) 5 MG tablet Take 1 tablet (5 mg total) by mouth 3 (three) times daily. Patient taking differently: Take 5 mg by mouth 2 (two) times daily.  10/22/14  Yes [name], MD  norethindrone (MICRONOR,CAMILA,ERRIN) 0.35 MG tablet Take 1 tablet by mouth daily.   Yes Historical Provider, MD  diltiazem (CARDIZEM CD) 120 MG 24 hr capsule Take 1 capsule (120 mg total) by mouth daily. 11/21/14  Minus Breeding, MD  ergocalciferol (VITAMIN D2) 50000 UNITS capsule Take 1 capsule (50,000 Units total) by mouth once a week. 11/02/14   [name], MD  esomeprazole (NEXIUM) 40 MG capsule Take 1 capsule (40 mg total) by mouth daily. 10/09/14   [name], MD  fluticasone (FLONASE) 50 MCG/ACT nasal spray Place 1 spray into both nostrils daily as needed for allergies or rhinitis. 10/15/14   Chelle S Jeffery, PA-C  VENTOLIN HFA 108 (90 BASE) MCG/ACT inhaler Inhale 1 puff into the lungs as needed.  10/09/14   Historical Provider, MD   BP 153/89 mmHg  Pulse 78  Temp(Src) 98.3 F (36.8 C) (Oral)  SpO2 100%  LMP 11/02/2014 Physical Exam    Constitutional: She appears well-developed and well-nourished. No distress.  HENT:  Head: Normocephalic and atraumatic.  Neck: Neck supple.  Cardiovascular: Normal rate and regular rhythm.   Pulmonary/Chest: Effort normal and breath sounds normal. No respiratory distress. She has no wheezes. She has no rales.  Abdominal: Soft. She exhibits no distension. There is no tenderness. There is no rebound and no guarding.  Mild lower abdominal tenderness, diffuse.    Musculoskeletal:       Legs: Neurological: She is alert.  Skin: She is not diaphoretic.  Psychiatric: Her speech is normal. Her mood appears anxious.  Extremely anxious  Nursing note and vitals reviewed.   ED Course  Procedures (including critical care time) Labs Review Labs Reviewed  CBC - Abnormal; Notable for the following:    WBC 10.8 (*)    All other components within normal limits  BASIC METABOLIC PANEL - Abnormal; Notable for the following:    Glucose, Bld 109 (*)    GFR calc non Af Amer 84 (*)    Anion gap 2 (*)    All other components within normal limits  URINALYSIS, ROUTINE W REFLEX MICROSCOPIC - Abnormal; Notable for the following:    Hgb urine dipstick TRACE (*)    All other components within normal limits  D-DIMER, QUANTITATIVE  PREGNANCY, URINE  URINE MICROSCOPIC-ADD ON  I-STAT TROPOININ, ED    Imaging Review Dg Chest 2 View  11/24/2014   CLINICAL DATA:  Patient complains of left leg pain X 2 days, and chest pain, SOB, mild cough, and mild congestion X 1 day. Hx of HTN, tachycardia, and diabetes. Ex-smoker X 3 months.  EXAM: CHEST  2 VIEW  COMPARISON:  09/19/2014  FINDINGS: The heart size and mediastinal contours are within normal limits. Both lungs are clear. No pleural effusion or pneumothorax. The visualized skeletal structures are unremarkable.  IMPRESSION: No active cardiopulmonary disease.   Electronically Signed   By: Lajean Manes M.D.   On: 11/24/2014 00:59     EKG Interpretation None         Date: @EDTODAY (<PARAMETER> error)@  Rate: 70  Rhythm: normal sinus rhythm  QRS Axis: normal  Intervals: normal  ST/T Wave abnormalities: normal  Conduction Disutrbances: none  Narrative Interpretation:   Old EKG Reviewed: not available       PERC negative.  Wells' Criteria for PE is 0.0.    MDM   Final diagnoses:  Chest pain  Chest wall pain  Musculoskeletal leg pain, left    Afebrile, nontoxic patient with multiple complaints, mostly chronic.  Has had two days of left upper thigh pain that is reproducible with palpation of hip flexor on left.  Left anterior chest pain is reproducible with palpation.  She is concerned about blood clot.  We have discussed that she is very low risk.  D-dimer negative.  EKG unremarkable.  CXR negative.  UA/Upreg negative.   Very mild leukocytosis, labs unremarkable.  D/C home with ibuprofen, flexeril, PCP follow up.  Discussed result, findings, treatment, and follow up  with patient.  Pt given return precautions.  Pt verbalizes understanding and agrees with plan.         Clayton Bibles, PA-C 11/24/14 0120  Julianne Rice, MD 11/24/14 [phone]

## 2014-11-23 NOTE — ED Notes (Signed)
Patient states that she has had different pains in her arms and legs, swelling in her hands, pain in her L groin and chest pain worse with inhalation. Alert and oriented. Recently started taking birth control again and is worried about a blood clot. Speaking in complete sentence.

## 2014-11-24 ENCOUNTER — Telehealth (HOSPITAL_COMMUNITY): Payer: Self-pay | Admitting: *Deleted

## 2014-11-24 LAB — URINE MICROSCOPIC-ADD ON

## 2014-11-24 LAB — D-DIMER, QUANTITATIVE (NOT AT ARMC): D-Dimer, Quant: 0.28 ug/mL-FEU (ref 0.00–0.48)

## 2014-11-24 LAB — URINALYSIS, ROUTINE W REFLEX MICROSCOPIC
Bilirubin Urine: NEGATIVE
Glucose, UA: NEGATIVE mg/dL
Ketones, ur: NEGATIVE mg/dL
Leukocytes, UA: NEGATIVE
Nitrite: NEGATIVE
Protein, ur: NEGATIVE mg/dL
Specific Gravity, Urine: 1.019 (ref 1.005–1.030)
Urobilinogen, UA: 0.2 mg/dL (ref 0.0–1.0)
pH: 6 (ref 5.0–8.0)

## 2014-11-24 LAB — PREGNANCY, URINE: Preg Test, Ur: NEGATIVE

## 2014-11-24 LAB — ACTH: C206 ACTH: 16 pg/mL (ref 6–50)

## 2014-11-24 MED ORDER — CYCLOBENZAPRINE HCL 10 MG PO TABS: 10.0000 mg | ORAL_TABLET | Freq: Three times a day (TID) | ORAL | Status: DC | PRN

## 2014-11-24 MED ORDER — IBUPROFEN 800 MG PO TABS: 800.0000 mg | ORAL_TABLET | Freq: Three times a day (TID) | ORAL | Status: DC | PRN

## 2014-11-24 NOTE — ED Notes (Signed)
Awake. Verbally responsive. A/O x4. Resp even and unlabored. No audible adventitious breath sounds noted. ABC's intact. SR on monitor at 72bpm

## 2014-11-24 NOTE — Discharge Instructions (Signed)
Read the information below.  Use the prescribed medication as directed.  Please discuss all new medications with your pharmacist.  You may return to the Emergency Department at any time for worsening condition or any new symptoms that concern you.    You have been diagnosed by your caregiver as having chest wall pain. SEEK IMMEDIATE MEDICAL ATTENTION IF: You develop a fever.  Your chest pains become severe or intolerable.  You develop new, unexplained symptoms (problems).  You develop shortness of breath, nausea, vomiting, sweating or feel light headed.  You develop a new cough or you cough up blood.    Chest Wall Pain Chest wall pain is pain in or around the bones and muscles of your chest. It may take up to 6 weeks to get better. It may take longer if you must stay physically active in your work and activities.  CAUSES  Chest wall pain may happen on its own. However, it may be caused by:  A viral illness like the flu.  Injury.  Coughing.  Exercise.  Arthritis.  Fibromyalgia.  Shingles. HOME CARE INSTRUCTIONS   Avoid overtiring physical activity. Try not to strain or perform activities that cause pain. This includes any activities using your chest or your abdominal and side muscles, especially if heavy weights are used.  Put ice on the sore area.  Put ice in a plastic bag.  Place a towel between your skin and the bag.  Leave the ice on for 15-20 minutes per hour while awake for the first 2 days.  Only take over-the-counter or prescription medicines for pain, discomfort, or fever as directed by your caregiver. SEEK IMMEDIATE MEDICAL CARE IF:   Your pain increases, or you are very uncomfortable.  You have a fever.  Your chest pain becomes worse.  You have new, unexplained symptoms.  You have nausea or vomiting.  You feel sweaty or lightheaded.  You have a cough with phlegm (sputum), or you cough up blood. MAKE SURE YOU:   Understand these  instructions.  Will watch your condition.  Will get help right away if you are not doing well or get worse. Document Released: 10/24/2005 Document Revised: 01/16/2012 Document Reviewed: 06/20/2011 [facility] Patient Information 2015 [place], [place]. This information is not intended to replace advice given to you by your health care provider. Make sure you discuss any questions you have with your health care provider.  Musculoskeletal Pain Musculoskeletal pain is muscle and boney aches and pains. These pains can occur in any part of the body. Your caregiver may treat you without knowing the cause of the pain. They may treat you if blood or urine tests, X-rays, and other tests were normal.  CAUSES There is often not a definite cause or reason for these pains. These pains may be caused by a type of germ (virus). The discomfort may also come from overuse. Overuse includes working out too hard when your body is not fit. Boney aches also come from weather changes. Bone is sensitive to atmospheric pressure changes. HOME CARE INSTRUCTIONS   Ask when your test results will be ready. Make sure you get your test results.  Only take over-the-counter or prescription medicines for pain, discomfort, or fever as directed by your caregiver. If you were given medications for your condition, do not drive, operate machinery or power tools, or sign legal documents for 24 hours. Do not drink alcohol. Do not take sleeping pills or other medications that may interfere with treatment.  Continue all activities  unless the activities cause more pain. When the pain lessens, slowly resume normal activities. Gradually increase the intensity and duration of the activities or exercise.  During periods of severe pain, bed rest may be helpful. Lay or sit in any position that is comfortable.  Putting ice on the injured area.  Put ice in a bag.  Place a towel between your skin and the bag.  Leave the ice on for 15 to 20  minutes, 3 to 4 times a day.  Follow up with your caregiver for continued problems and no reason can be found for the pain. If the pain becomes worse or does not go away, it may be necessary to repeat tests or do additional testing. Your caregiver may need to look further for a possible cause. SEEK IMMEDIATE MEDICAL CARE IF:  You have pain that is getting worse and is not relieved by medications.  You develop chest pain that is associated with shortness or breath, sweating, feeling sick to your stomach (nauseous), or throw up (vomit).  Your pain becomes localized to the abdomen.  You develop any new symptoms that seem different or that concern you. MAKE SURE YOU:   Understand these instructions.  Will watch your condition.  Will get help right away if you are not doing well or get worse. Document Released: 10/24/2005 Document Revised: 01/16/2012 Document Reviewed: 06/28/2013 [facility] Patient Information 2015 [place], [place]. This information is not intended to replace advice given to you by your health care provider. Make sure you discuss any questions you have with your health care provider.

## 2014-11-24 NOTE — ED Notes (Signed)
Patient transported to X-ray 

## 2014-11-24 NOTE — ED Notes (Signed)
Awake. Verbally responsive. A/O x4. Resp even and unlabored. No audible adventitious breath sounds noted. ABC's intact.  

## 2014-11-26 ENCOUNTER — Encounter: Payer: Self-pay | Admitting: Neurology

## 2014-11-26 ENCOUNTER — Telehealth: Payer: Self-pay | Admitting: Neurology

## 2014-11-26 ENCOUNTER — Ambulatory Visit
Admission: RE | Admit: 2014-11-26 | Discharge: 2014-11-26 | Disposition: A | Discharge status: Home / Self Care | Payer: 59 | Source: Ambulatory Visit | Attending: Family Medicine | Admitting: Family Medicine

## 2014-11-26 DIAGNOSIS — K2100 Gastro-esophageal reflux disease with esophagitis, without bleeding: Secondary | ICD-10-CM

## 2014-11-26 LAB — ALDOSTERONE + RENIN ACTIVITY W/ RATIO
ALDO / PRA Ratio: 14.3 Ratio (ref 0.9–28.9)
Aldosterone: 5 ng/dL
PRA LC/MS/MS: 0.35 ng/mL/h (ref 0.25–5.82)

## 2014-11-26 NOTE — Telephone Encounter (Signed)
Patient was contacted and provided the results of her overnight sleep study that did not reveal any significant sleep apnea.  Patient was advised that only mild periodic limb movements were observed.  Patient was informed that no further follow up was needed with Dr. Rexene Alberts and that she should continue to follow up with her PCP as well as Dr. Krista Blue.  Both were routed copies of her sleep study report.

## 2014-11-26 NOTE — Telephone Encounter (Signed)
Please call and notify the patient that the recent sleep study did not show any significant obstructive sleep apnea. Dr. Krista Blue or Cecille Rubin can go over the details of the study during a follow up appointment, which is scheduled for February. Will will mail her a copy and also send a report to her PCP and Dr. Krista Blue.   Once you have spoken to patient, you can close this encounter.   Thanks,  Star Age, MD, PhD [facility])

## 2014-11-27 ENCOUNTER — Telehealth: Payer: Self-pay | Admitting: *Deleted

## 2014-11-27 NOTE — Telephone Encounter (Signed)
Phoned patient and cancelled tomorrow's OV with Dr. Brigitte Pulse. Since it was for her disability forms/follow up, she is going to try to see her during the walk in either Monday or Tuesday (provider walk in schedule provided).

## 2014-11-28 ENCOUNTER — Ambulatory Visit: Payer: 59 | Admitting: Family Medicine

## 2014-12-01 ENCOUNTER — Encounter: Payer: Self-pay | Admitting: Family Medicine

## 2014-12-01 ENCOUNTER — Ambulatory Visit (INDEPENDENT_AMBULATORY_CARE_PROVIDER_SITE_OTHER): Payer: 59 | Admitting: Family Medicine

## 2014-12-01 VITALS — BP 150/80 | HR 80 | Temp 98.7°F | Resp 16 | Ht 63.0 in | Wt 202.4 lb

## 2014-12-01 DIAGNOSIS — R079 Chest pain, unspecified: Secondary | ICD-10-CM

## 2014-12-01 DIAGNOSIS — R109 Unspecified abdominal pain: Secondary | ICD-10-CM

## 2014-12-02 ENCOUNTER — Other Ambulatory Visit: Payer: Self-pay | Admitting: Radiology

## 2014-12-02 DIAGNOSIS — R109 Unspecified abdominal pain: Secondary | ICD-10-CM

## 2014-12-02 DIAGNOSIS — L089 Local infection of the skin and subcutaneous tissue, unspecified: Secondary | ICD-10-CM

## 2014-12-02 DIAGNOSIS — S31135A Puncture wound of abdominal wall without foreign body, periumbilic region without penetration into peritoneal cavity, initial encounter: Principal | ICD-10-CM

## 2014-12-03 ENCOUNTER — Encounter (HOSPITAL_COMMUNITY): Payer: Self-pay | Admitting: Emergency Medicine

## 2014-12-03 ENCOUNTER — Emergency Department (HOSPITAL_COMMUNITY)
Admission: EM | Admit: 2014-12-03 | Discharge: 2014-12-03 | Disposition: A | Discharge status: Home / Self Care | Payer: 59 | Attending: Emergency Medicine | Admitting: Emergency Medicine

## 2014-12-03 DIAGNOSIS — J0100 Acute maxillary sinusitis, unspecified: Secondary | ICD-10-CM | POA: Insufficient documentation

## 2014-12-03 DIAGNOSIS — Z880 Allergy status to penicillin: Secondary | ICD-10-CM | POA: Diagnosis not present

## 2014-12-03 DIAGNOSIS — R110 Nausea: Secondary | ICD-10-CM | POA: Insufficient documentation

## 2014-12-03 DIAGNOSIS — Z793 Long term (current) use of hormonal contraceptives: Secondary | ICD-10-CM | POA: Diagnosis not present

## 2014-12-03 DIAGNOSIS — J0110 Acute frontal sinusitis, unspecified: Secondary | ICD-10-CM | POA: Diagnosis not present

## 2014-12-03 DIAGNOSIS — K219 Gastro-esophageal reflux disease without esophagitis: Secondary | ICD-10-CM | POA: Insufficient documentation

## 2014-12-03 DIAGNOSIS — Z3202 Encounter for pregnancy test, result negative: Secondary | ICD-10-CM | POA: Insufficient documentation

## 2014-12-03 DIAGNOSIS — Z87891 Personal history of nicotine dependence: Secondary | ICD-10-CM | POA: Insufficient documentation

## 2014-12-03 DIAGNOSIS — E119 Type 2 diabetes mellitus without complications: Secondary | ICD-10-CM | POA: Diagnosis not present

## 2014-12-03 DIAGNOSIS — Z8742 Personal history of other diseases of the female genital tract: Secondary | ICD-10-CM | POA: Diagnosis not present

## 2014-12-03 DIAGNOSIS — R51 Headache: Secondary | ICD-10-CM | POA: Diagnosis present

## 2014-12-03 DIAGNOSIS — I10 Essential (primary) hypertension: Secondary | ICD-10-CM | POA: Insufficient documentation

## 2014-12-03 DIAGNOSIS — R42 Dizziness and giddiness: Secondary | ICD-10-CM | POA: Insufficient documentation

## 2014-12-03 DIAGNOSIS — Z79899 Other long term (current) drug therapy: Secondary | ICD-10-CM | POA: Insufficient documentation

## 2014-12-03 LAB — CBC WITH DIFFERENTIAL/PLATELET
Basophils Absolute: 0 10*3/uL (ref 0.0–0.1)
Basophils Relative: 0 % (ref 0–1)
Eosinophils Absolute: 0.1 10*3/uL (ref 0.0–0.7)
Eosinophils Relative: 1 % (ref 0–5)
HCT: 37.1 % (ref 36.0–46.0)
Hemoglobin: 12.5 g/dL (ref 12.0–15.0)
Lymphocytes Relative: 47 % — ABNORMAL HIGH (ref 12–46)
Lymphs Abs: 4.3 10*3/uL — ABNORMAL HIGH (ref 0.7–4.0)
MCH: 29.5 pg (ref 26.0–34.0)
MCHC: 33.7 g/dL (ref 30.0–36.0)
MCV: 87.5 fL (ref 78.0–100.0)
Monocytes Absolute: 0.7 10*3/uL (ref 0.1–1.0)
Monocytes Relative: 8 % (ref 3–12)
Neutro Abs: 4 10*3/uL (ref 1.7–7.7)
Neutrophils Relative %: 44 % (ref 43–77)
Platelets: 267 10*3/uL (ref 150–400)
RBC: 4.24 MIL/uL (ref 3.87–5.11)
RDW: 12.8 % (ref 11.5–15.5)
WBC: 9 10*3/uL (ref 4.0–10.5)

## 2014-12-03 LAB — URINALYSIS, ROUTINE W REFLEX MICROSCOPIC
Bilirubin Urine: NEGATIVE
Glucose, UA: NEGATIVE mg/dL
Ketones, ur: NEGATIVE mg/dL
Leukocytes, UA: NEGATIVE
Nitrite: NEGATIVE
Protein, ur: NEGATIVE mg/dL
Specific Gravity, Urine: 1.012 (ref 1.005–1.030)
Urobilinogen, UA: 0.2 mg/dL (ref 0.0–1.0)
pH: 5.5 (ref 5.0–8.0)

## 2014-12-03 LAB — BLOOD GAS, ARTERIAL
Acid-base deficit: 0.6 mmol/L (ref 0.0–2.0)
Bicarbonate: 21.9 mEq/L (ref 20.0–24.0)
Drawn by: 31814
FIO2: 0.21 %
O2 Saturation: 98.4 %
Patient temperature: 98.1
TCO2: 19.2 mmol/L (ref 0–100)
pCO2 arterial: 30.6 mmHg — ABNORMAL LOW (ref 35.0–45.0)
pH, Arterial: 7.468 — ABNORMAL HIGH (ref 7.350–7.450)
pO2, Arterial: 110 mmHg — ABNORMAL HIGH (ref 80.0–100.0)

## 2014-12-03 LAB — COMPREHENSIVE METABOLIC PANEL
ALT: 13 U/L (ref 0–35)
AST: 19 U/L (ref 0–37)
Albumin: 4.1 g/dL (ref 3.5–5.2)
Alkaline Phosphatase: 60 U/L (ref 39–117)
Anion gap: 8 (ref 5–15)
BUN: 13 mg/dL (ref 6–23)
CO2: 23 mmol/L (ref 19–32)
Calcium: 8.6 mg/dL (ref 8.4–10.5)
Chloride: 104 mmol/L (ref 96–112)
Creatinine, Ser: 0.92 mg/dL (ref 0.50–1.10)
GFR calc Af Amer: 90 mL/min — ABNORMAL LOW (ref >90)
GFR calc non Af Amer: 77 mL/min — ABNORMAL LOW (ref >90)
Glucose, Bld: 111 mg/dL — ABNORMAL HIGH (ref 70–99)
Potassium: 3.8 mmol/L (ref 3.5–5.1)
Sodium: 135 mmol/L (ref 135–145)
Total Bilirubin: 0.8 mg/dL (ref 0.3–1.2)
Total Protein: 7 g/dL (ref 6.0–8.3)

## 2014-12-03 LAB — PREGNANCY, URINE: Preg Test, Ur: NEGATIVE

## 2014-12-03 LAB — URINE MICROSCOPIC-ADD ON

## 2014-12-03 MED ORDER — KETOROLAC TROMETHAMINE 30 MG/ML IJ SOLN
30.0000 mg | Freq: Once | INTRAMUSCULAR | Status: DC
  Filled 2014-12-03: qty 1

## 2014-12-03 MED ORDER — DM-GUAIFENESIN ER 30-600 MG PO TB12
1.0000 | ORAL_TABLET | Freq: Two times a day (BID) | ORAL | Status: DC
Start: 2014-12-03 — End: 2015-02-24

## 2014-12-03 MED ORDER — DM-GUAIFENESIN ER 30-600 MG PO TB12
1.0000 | ORAL_TABLET | Freq: Two times a day (BID) | ORAL | Status: DC
  Administered 2014-12-03: 1 via ORAL
  Filled 2014-12-03 (×2): qty 1

## 2014-12-03 MED ORDER — ONDANSETRON HCL 4 MG/2ML IJ SOLN
4.0000 mg | Freq: Once | INTRAMUSCULAR | Status: DC
  Filled 2014-12-03: qty 2

## 2014-12-03 MED ORDER — SALINE SPRAY 0.65 % NA SOLN: 2.0000 | NASAL | Status: DC | PRN

## 2014-12-03 MED ORDER — FENTANYL CITRATE 0.05 MG/ML IJ SOLN
50.0000 ug | Freq: Once | INTRAMUSCULAR | Status: DC
  Filled 2014-12-03: qty 2

## 2014-12-03 NOTE — ED Notes (Signed)
Patient requested a referral to the neurologist.

## 2014-12-03 NOTE — ED Provider Notes (Signed)
CSN: [id number]     Arrival date & time 12/03/14  0239 History   First MD Initiated Contact with Patient 12/03/14 [phone]     Chief Complaint  Patient presents with  . Headache     (Consider location/radiation/quality/duration/timing/severity/associated sxs/prior Treatment) HPI Comments: The patient is a 39 year old female presenting emergency room chief complaint of headache for several days. The patient reports frontal and sinus headache for several days.  She reports reports associated rhinorrhea and swishing sound with head movement in ears.  The patient concerned of possible carbon monoxide poisoning due to a kerosene heater in her house. Patient reports a kerosene heater has not been lit or used in several years. She denies use of gas heat, denies exposure to automobile exhaust in a closed area, or any indoor flame. She reports she purchased a carbon monoxide detector several days ago, has not gone off. The patient reports associated blurry vision, relating it to her CBG of 140. Stating she has blurry vision in her glucose is elevated. Patient denies fever, abdominal pain, sore throat, shortness of breath.  The history is provided by the patient. No language interpreter was used.    Past Medical History  Diagnosis Date  . Hypertension   . GERD (gastroesophageal reflux disease)   . Tachycardia   . Diabetes mellitus without complication   . Fibroid uterus    Past Surgical History  Procedure Laterality Date  . Wisdom tooth extraction     Family History  Problem Relation Age of Onset  . Seizures Mother   . Cancer Father     Liver  . Diabetes Father 20  . Hypertension Father   . Heart disease Father 65    CEA, LE Stenting  . Alzheimer's disease Maternal Grandmother   . Heart disease Maternal Grandfather    History  Substance Use Topics  . Smoking status: Former Smoker    Types: Cigarettes    Quit date: 08/19/2014  . Smokeless tobacco: Never Used     Comment: Quit smoking  last month  . Alcohol Use: 0.0 oz/week    0 Not specified per week     Comment: Usually 1 time a month   OB History    Gravida Para Term Preterm AB TAB SAB Ectopic Multiple Living   2 0 0 0 2 1 1 0 0 0      Review of Systems  Constitutional: Negative for fever and chills.  HENT: Positive for congestion and sinus pressure.   Gastrointestinal: Positive for nausea. Negative for vomiting, abdominal pain, diarrhea and constipation.  Genitourinary: Negative for dysuria and hematuria.  Musculoskeletal: Negative for neck stiffness.  Neurological: Positive for light-headedness and headaches. Negative for syncope, weakness and numbness.      Allergies  Penicillins and Tamiflu  Home Medications   Prior to Admission medications   Medication Sig Start Date End Date Taking? Authorizing Provider  busPIRone (BUSPAR) 5 MG tablet Take 1 tablet (5 mg total) by mouth 3 (three) times daily. Patient taking differently: Take 5 mg by mouth 2 (two) times daily.  10/22/14  Yes [name], MD  ergocalciferol (VITAMIN D2) 50000 UNITS capsule Take 1 capsule (50,000 Units total) by mouth once a week. Patient taking differently: Take 50,000 Units by mouth every Friday.  11/02/14  Yes [name], MD  esomeprazole (NEXIUM) 40 MG capsule Take 1 capsule (40 mg total) by mouth daily. 10/09/14  Yes [name], MD  fluticasone (FLONASE) 50 MCG/ACT nasal spray Place 1  spray into both nostrils daily as needed for allergies or rhinitis. 10/15/14  Yes Chelle S Jeffery, PA-C  hydroxypropyl methylcellulose / hypromellose (ISOPTO TEARS / GONIOVISC) 2.5 % ophthalmic solution Place 1 drop into both eyes 3 (three) times daily as needed for dry eyes.   Yes Historical Provider, MD  norethindrone (MICRONOR,CAMILA,ERRIN) 0.35 MG tablet Take 1 tablet by mouth daily.   Yes Historical Provider, MD  VENTOLIN HFA 108 (90 BASE) MCG/ACT inhaler Inhale 1 puff into the lungs every 4 (four) hours as needed for wheezing or shortness of breath.   10/09/14  Yes Historical Provider, MD  cyclobenzaprine (FLEXERIL) 10 MG tablet Take 1 tablet (10 mg total) by mouth 3 (three) times daily as needed for muscle spasms (or pain). 11/24/14   Clayton Bibles, PA-C  diltiazem (CARDIZEM CD) 120 MG 24 hr capsule Take 1 capsule (120 mg total) by mouth daily. 11/21/14   Minus Breeding, MD  ibuprofen (ADVIL,MOTRIN) 800 MG tablet Take 1 tablet (800 mg total) by mouth every 8 (eight) hours as needed for mild pain or moderate pain. 11/24/14   Clayton Bibles, PA-C   BP 140/89 mmHg  Pulse 71  Temp(Src) 98.1 F (36.7 C) (Oral)  Resp 18  Ht 5\' 3"  (1.6 m)  Wt 202 lb (91.627 kg)  BMI 35.79 kg/m2  SpO2 99%  LMP 11/02/2014 Physical Exam  Constitutional: She is oriented to person, place, and time. She appears well-developed and well-nourished. No distress.  HENT:  Head: Normocephalic and atraumatic.  Right Ear: Tympanic membrane normal. No drainage. No middle ear effusion.  Left Ear: Tympanic membrane normal. No drainage.  No middle ear effusion.  Nose: Rhinorrhea present. Right sinus exhibits maxillary sinus tenderness and frontal sinus tenderness. Left sinus exhibits maxillary sinus tenderness and frontal sinus tenderness.  Mouth/Throat: Uvula is midline and oropharynx is clear and moist. No oropharyngeal exudate, posterior oropharyngeal edema, posterior oropharyngeal erythema or tonsillar abscesses.  Eyes: EOM are normal. Pupils are equal, round, and reactive to light.  Neck: Normal range of motion and full passive range of motion without pain. Neck supple.  Cardiovascular: Normal rate and regular rhythm.   Pulmonary/Chest: Effort normal. No respiratory distress. She has no wheezes. She has no rales. She exhibits no tenderness.  Abdominal: There is no tenderness. There is no rebound and no guarding.  Neurological: She is alert and oriented to person, place, and time.  Speech is clear and goal oriented, follows commands II-Visual fields were normal.   III/IV/VI-Pupils  were equal and reacted. Extraocular movements were full and conjugate.  V/VII-no facial droop.   VIII-normal.   Motor: Strength 5/5 to upper and lower extremities bilaterally. Moves all 4 extremities equally. Sensory: normal sensation to upper and lower extremities.  Cerebellar: Normal finger to nose bilaterally No pronator drift.  Skin: Skin is warm and dry. She is not diaphoretic.  Psychiatric: She has a normal mood and affect. Her behavior is normal.  Nursing note and vitals reviewed.   ED Course  Procedures (including critical care time) Labs Review Labs Reviewed  BLOOD GAS, ARTERIAL - Abnormal; Notable for the following:    pH, Arterial 7.468 (*)    pCO2 arterial 30.6 (*)    pO2, Arterial 110.0 (*)    All other components within normal limits  COMPREHENSIVE METABOLIC PANEL - Abnormal; Notable for the following:    Glucose, Bld 111 (*)    GFR calc non Af Amer 77 (*)    GFR calc Af Amer 90 (*)  All other components within normal limits  CBC WITH DIFFERENTIAL/PLATELET - Abnormal; Notable for the following:    Lymphocytes Relative 47 (*)    Lymphs Abs 4.3 (*)    All other components within normal limits  URINALYSIS, ROUTINE W REFLEX MICROSCOPIC - Abnormal; Notable for the following:    Hgb urine dipstick TRACE (*)    All other components within normal limits  PREGNANCY, URINE  URINE MICROSCOPIC-ADD ON    Imaging Review No results found.   EKG Interpretation None      MDM   Final diagnoses:  Acute maxillary sinusitis, recurrence not specified  Acute frontal sinusitis, recurrence not specified   Patient with headache, afebrile, no meningismus, nonfocal neuro exam. Likely sinus infection. Patient is concerned about carbon monoxide poisoning however has no risk of exposure, most of exam and encounter was discussing possible exposures and reassuring the patient that she has no risk factors for monoxide inhalation. Reevaluation patient reports moderate resolution of  headache. Discussed lab results with the patient and need to follow-up with PCP if sinus infection symptoms persist. Discussed lab results, and treatment plan with the patient. Return precautions given. Reports understanding and no other concerns at this time.  Patient is stable for discharge at this time. Meds given in ED:  Medications - No data to display  Discharge Medication List as of 12/03/2014  8:20 AM    START taking these medications   Details  dextromethorphan-guaiFENesin (MUCINEX DM) 30-600 MG per 12 hr tablet Take 1 tablet by mouth 2 (two) times daily., Starting 12/03/2014, Until Discontinued, Print    sodium chloride (OCEAN) 0.65 % SOLN nasal spray Place 2 sprays into both nostrils as needed for congestion., Starting 12/03/2014, Until Discontinued, Print         Harvie Heck, PA-C 12/03/14 Gay, MD 12/06/14 [phone]

## 2014-12-03 NOTE — ED Notes (Signed)
Pt presents with c/o HA, nausea and dizziness after returning home. Pt does have kerosene heater in home but does not use it. Otherwise pt uses electric heaters. Pt states s/s only come when she arrives home.

## 2014-12-03 NOTE — ED Notes (Signed)
RT at bedside.

## 2014-12-03 NOTE — Discharge Instructions (Signed)
Call for a follow up appointment with a Family or Primary Care Provider.  Return if Symptoms worsen.   Take medication as prescribed.  I have attached information on Carbon Monoxide poisoning. You do not have any risk factors for Carbon Monoxide exposure. Please read at your convenience.  Call your landlord to install another Energy manager.  Return if the detector you purchased is activated.

## 2014-12-04 ENCOUNTER — Telehealth: Payer: Self-pay | Admitting: Family Medicine

## 2014-12-04 NOTE — Telephone Encounter (Signed)
It is not in the nurses box. Dr. Brigitte Pulse do you have this form?

## 2014-12-04 NOTE — Telephone Encounter (Signed)
Patient was seen on 12/01/2014 and had a STD paper completed from [place] during her office visit. Did patient take this copy with her? Was an extra copy made and put in the disabilities tray? Perhaps I have overlooked it or something. [employer]  called to check the status of this paperwork. I can fax it if we locate a copy of it. Thanks :-)  [employer]

## 2014-12-05 ENCOUNTER — Telehealth: Payer: Self-pay

## 2014-12-05 LAB — WOUND CULTURE

## 2014-12-05 NOTE — Telephone Encounter (Signed)
I'm pretty sure I still have her paperwork and it has not been completed yet.  Will try to prioritize to do this weekend.

## 2014-12-05 NOTE — Telephone Encounter (Signed)
Pt called. Said she had an arterial blood gas done in the ER and now has a hematoma and her forearm is swollen. Advised to elevate and after speaking with Legrand Como, let pt know that this is probably nothing, but if it gets worse or not any better to RTC to have it checked. Pt agrees.

## 2014-12-09 ENCOUNTER — Emergency Department (HOSPITAL_COMMUNITY): Payer: 59

## 2014-12-09 ENCOUNTER — Encounter (HOSPITAL_COMMUNITY): Payer: Self-pay

## 2014-12-09 ENCOUNTER — Emergency Department (HOSPITAL_COMMUNITY)
Admission: EM | Admit: 2014-12-09 | Discharge: 2014-12-09 | Disposition: A | Discharge status: Home / Self Care | Payer: 59 | Attending: Emergency Medicine | Admitting: Emergency Medicine

## 2014-12-09 ENCOUNTER — Telehealth (HOSPITAL_COMMUNITY): Payer: Self-pay

## 2014-12-09 DIAGNOSIS — R0602 Shortness of breath: Secondary | ICD-10-CM | POA: Insufficient documentation

## 2014-12-09 DIAGNOSIS — R079 Chest pain, unspecified: Secondary | ICD-10-CM | POA: Diagnosis present

## 2014-12-09 DIAGNOSIS — I10 Essential (primary) hypertension: Secondary | ICD-10-CM | POA: Diagnosis not present

## 2014-12-09 DIAGNOSIS — M79631 Pain in right forearm: Secondary | ICD-10-CM | POA: Insufficient documentation

## 2014-12-09 DIAGNOSIS — E119 Type 2 diabetes mellitus without complications: Secondary | ICD-10-CM | POA: Diagnosis not present

## 2014-12-09 DIAGNOSIS — M549 Dorsalgia, unspecified: Secondary | ICD-10-CM | POA: Diagnosis not present

## 2014-12-09 DIAGNOSIS — Z880 Allergy status to penicillin: Secondary | ICD-10-CM | POA: Insufficient documentation

## 2014-12-09 DIAGNOSIS — R21 Rash and other nonspecific skin eruption: Secondary | ICD-10-CM | POA: Diagnosis not present

## 2014-12-09 DIAGNOSIS — Z79899 Other long term (current) drug therapy: Secondary | ICD-10-CM | POA: Insufficient documentation

## 2014-12-09 DIAGNOSIS — Z87891 Personal history of nicotine dependence: Secondary | ICD-10-CM | POA: Diagnosis not present

## 2014-12-09 DIAGNOSIS — Z8742 Personal history of other diseases of the female genital tract: Secondary | ICD-10-CM | POA: Insufficient documentation

## 2014-12-09 DIAGNOSIS — K219 Gastro-esophageal reflux disease without esophagitis: Secondary | ICD-10-CM | POA: Diagnosis not present

## 2014-12-09 DIAGNOSIS — R0789 Other chest pain: Secondary | ICD-10-CM | POA: Diagnosis not present

## 2014-12-09 DIAGNOSIS — Z793 Long term (current) use of hormonal contraceptives: Secondary | ICD-10-CM | POA: Diagnosis not present

## 2014-12-09 LAB — I-STAT TROPONIN, ED: Troponin i, poc: 0 ng/mL (ref 0.00–0.08)

## 2014-12-09 LAB — CBC
HCT: 39.5 % (ref 36.0–46.0)
Hemoglobin: 13.2 g/dL (ref 12.0–15.0)
MCH: 29.5 pg (ref 26.0–34.0)
MCHC: 33.4 g/dL (ref 30.0–36.0)
MCV: 88.2 fL (ref 78.0–100.0)
Platelets: 294 10*3/uL (ref 150–400)
RBC: 4.48 MIL/uL (ref 3.87–5.11)
RDW: 12.6 % (ref 11.5–15.5)
WBC: 9.5 10*3/uL (ref 4.0–10.5)

## 2014-12-09 LAB — BASIC METABOLIC PANEL
Anion gap: 7 (ref 5–15)
BUN: 15 mg/dL (ref 6–23)
CO2: 27 mmol/L (ref 19–32)
Calcium: 9.2 mg/dL (ref 8.4–10.5)
Chloride: 102 mmol/L (ref 96–112)
Creatinine, Ser: 0.97 mg/dL (ref 0.50–1.10)
GFR calc Af Amer: 84 mL/min — ABNORMAL LOW (ref >90)
GFR calc non Af Amer: 73 mL/min — ABNORMAL LOW (ref >90)
Glucose, Bld: 96 mg/dL (ref 70–99)
Potassium: 3.9 mmol/L (ref 3.5–5.1)
Sodium: 136 mmol/L (ref 135–145)

## 2014-12-09 NOTE — ED Provider Notes (Signed)
CSN: [id number]     Arrival date & time 12/09/14  1745 History   First MD Initiated Contact with Patient 12/09/14 1905     Chief Complaint  Patient presents with  . Chest Pain  . Rash  . Back Pain     (Consider location/radiation/quality/duration/timing/severity/associated sxs/prior Treatment) HPI Patient is here complaining of multiple complaints.   Patient is here describing right forearm pain after being seen a week ago for chest pain and shortness of breath. Patient had blood drawn, and an ABG. Patient reports mild bruising from ABG as well as generalized forearm pain. Patient denies redness, swelling, erythema, warmth, numbness, weakness.   patient is also complaining of an intermittent left-sided dull, tight chest is comfort which is worse with deep inspiration and palpation. Patient reports these symptoms have been occurring intermittently since October of last year, 4 months ago. Patient reports her symptoms have been worse over the past week, and was recently worked up on 11/23/14 for same. Patient reports mild shortness of breath which has been unchanged since the symptoms have been occurring for the past 4 months. Patient reports taking oral contraceptives that do not contain estrogen.  Patient also complaining of lumbar back pain. Patient states she has been experiencing this pain for 5-6 weeks, and has recently been seen and evaluated by her PCP for same. Patient states her PCP is currently managing the symptoms. Patient denies any weakness, numbness, saddle anesthesia, bowel/bladder incontinence/retention, history of IV drug use or cancer.  Past Medical History  Diagnosis Date  . Hypertension   . GERD (gastroesophageal reflux disease)   . Tachycardia   . Diabetes mellitus without complication   . Fibroid uterus    Past Surgical History  Procedure Laterality Date  . Wisdom tooth extraction     Family History  Problem Relation Age of Onset  . Seizures Mother   . Cancer  Father     Liver  . Diabetes Father 5  . Hypertension Father   . Heart disease Father 72    CEA, LE Stenting  . Alzheimer's disease Maternal Grandmother   . Heart disease Maternal Grandfather    History  Substance Use Topics  . Smoking status: Former Smoker    Types: Cigarettes    Quit date: 08/19/2014  . Smokeless tobacco: Never Used     Comment: Quit smoking last month  . Alcohol Use: 0.0 oz/week    0 Not specified per week     Comment: Usually 1 time a month   OB History    Gravida Para Term Preterm AB TAB SAB Ectopic Multiple Living   2 0 0 0 2 1 1 0 0 0      Review of Systems  Constitutional: Negative for fever.  HENT: Negative for trouble swallowing.   Eyes: Negative for visual disturbance.  Respiratory: Positive for chest tightness and shortness of breath.   Cardiovascular: Negative for chest pain and palpitations.  Gastrointestinal: Negative for nausea, vomiting and abdominal pain.  Genitourinary: Negative for dysuria.  Musculoskeletal: Positive for back pain. Negative for joint swelling and neck pain.       Right forearm pain  Skin: Positive for rash.  Neurological: Negative for dizziness, weakness and numbness.  Psychiatric/Behavioral: Negative.       Allergies  Penicillins and Tamiflu  Home Medications   Prior to Admission medications   Medication Sig Start Date End Date Taking? Authorizing Provider  busPIRone (BUSPAR) 5 MG tablet Take 1 tablet (5 mg total) by mouth  3 (three) times daily. Patient taking differently: Take 5 mg by mouth 2 (two) times daily.  10/22/14  Yes [name], MD  ergocalciferol (VITAMIN D2) 50000 UNITS capsule Take 1 capsule (50,000 Units total) by mouth once a week. Patient taking differently: Take 50,000 Units by mouth every Friday.  11/02/14  Yes [name], MD  fluticasone (FLONASE) 50 MCG/ACT nasal spray Place 1 spray into both nostrils daily as needed for allergies or rhinitis. 10/15/14  Yes Chelle S Jeffery, PA-C   hydroxypropyl methylcellulose / hypromellose (ISOPTO TEARS / GONIOVISC) 2.5 % ophthalmic solution Place 1 drop into both eyes 3 (three) times daily as needed for dry eyes.   Yes Historical Provider, MD  norethindrone (MICRONOR,CAMILA,ERRIN) 0.35 MG tablet Take 1 tablet by mouth daily.   Yes Historical Provider, MD  sodium chloride (OCEAN) 0.65 % SOLN nasal spray Place 2 sprays into both nostrils as needed for congestion. 12/03/14  Yes Lauren Parker, PA-C  VENTOLIN HFA 108 (90 BASE) MCG/ACT inhaler Inhale 1 puff into the lungs every 4 (four) hours as needed for wheezing or shortness of breath (wheezing & shortness of breath).  10/09/14  Yes Historical Provider, MD  cyclobenzaprine (FLEXERIL) 10 MG tablet Take 1 tablet (10 mg total) by mouth 3 (three) times daily as needed for muscle spasms (or pain). 11/24/14   Clayton Bibles, PA-C  dextromethorphan-guaiFENesin [facility] DM) 30-600 MG per 12 hr tablet Take 1 tablet by mouth 2 (two) times daily. 12/03/14   Harvie Heck, PA-C  diltiazem (CARDIZEM CD) 120 MG 24 hr capsule Take 1 capsule (120 mg total) by mouth daily. 11/21/14   Minus Breeding, MD  esomeprazole (NEXIUM) 40 MG capsule Take 1 capsule (40 mg total) by mouth daily. Patient not taking: Reported on 12/09/2014 10/09/14   [name], MD  ibuprofen (ADVIL,MOTRIN) 800 MG tablet Take 1 tablet (800 mg total) by mouth every 8 (eight) hours as needed for mild pain or moderate pain. Patient not taking: Reported on 12/09/2014 11/24/14   Clayton Bibles, PA-C   BP 122/81 mmHg  Pulse 72  Temp(Src) 98 F (36.7 C) (Oral)  Resp 16  SpO2 99%  LMP 11/28/2014 Physical Exam  Constitutional: She is oriented to person, place, and time. She appears well-developed and well-nourished. No distress.  HENT:  Head: Normocephalic and atraumatic.  Mouth/Throat: Oropharynx is clear and moist. No oropharyngeal exudate.  Eyes: Right eye exhibits no discharge. Left eye exhibits no discharge. No scleral icterus.  Neck: Normal range of  motion.  Cardiovascular: Normal rate, regular rhythm and normal heart sounds.   No murmur heard. Pulmonary/Chest: Effort normal and breath sounds normal. No respiratory distress.  Abdominal: Soft. There is no tenderness.  Musculoskeletal: Normal range of motion. She exhibits no edema.       Right forearm: She exhibits tenderness. She exhibits no bony tenderness, no swelling, no edema, no deformity and no laceration.  Mild tenderness to right forearm with mild, old ecchymosis on right radial region consistent with bruise in the later stages of healing. No erythema, warmth, edema. Radial pulse 2+. Capillary refill less than 2 seconds distally.  Lymphadenopathy:  Bilaterally there is no obvious edema, erythema, warmth to upper or lower extremities.  Neurological: She is alert and oriented to person, place, and time. No cranial nerve deficit. Coordination normal.  Skin: Skin is warm and dry. No rash noted. She is not diaphoretic.  Psychiatric: She has a normal mood and affect.  Nursing note and vitals reviewed.  ED Course  Procedures (including critical care time) Labs Review Labs Reviewed  BASIC METABOLIC PANEL - Abnormal; Notable for the following:    GFR calc non Af Amer 73 (*)    GFR calc Af Amer 84 (*)    All other components within normal limits  CBC  I-STAT TROPOININ, ED    Imaging Review Dg Chest 2 View  12/09/2014   CLINICAL DATA:  Chest tightness. Shortness of breath and upper back pain.  EXAM: CHEST  2 VIEW  COMPARISON:  11/24/2014  FINDINGS: The heart size and mediastinal contours are within normal limits. Both lungs are clear. The visualized skeletal structures are unremarkable.  IMPRESSION: Normal exam.   Electronically Signed   By: Rozetta Nunnery M.D.   On: 12/09/2014 20:38     EKG Interpretation None      MDM   Final diagnoses:  Chest discomfort   Patient has multiple somatic complaints that have been ongoing for some time now, patient being seen and evaluated for  her chest discomfort from her PCP and by Dr. Dulce Sellar  with cardiology.  1. Chest discomfort: Throughout ER stay patient is afebrile, non-tachycardic, nontachypneic, non-hypoxic, well-appearing and in no acute distress. Patient's complaints seem to be mostly chronic. Patient's EKG is without evidence of acute abnormality, injury or ectopy. Troponin negative. No leukocytosis or anemia. Electrolytes within normal limits. Renal function intact. Patient's arm discomfort not concerning for DVT, patient had recent workup for PE as well with negative d-dimer 2 weeks ago. Patient PERC negative tonight. Given that patient's symptoms have been ongoing for 4 months, do not believe further workup for chest pain shortness of breath is warranted at this time. Patient given reassurance, and strongly encouraged to follow-up with her primary care physician. Patient reports she has a appointment for an outpatient stress test tomorrow, I strongly encouraged her to follow-up with her cardiologist as well.   2. Arm swelling No obvious swelling appreciated on exam. No erythema, warmth, edema. Patient neurovascularly intact. No concern for DVT. Mild bruising noted around radial region. Patient has 2+ radial pulses bilaterally. No concern for bleeding from site of ABG. Patient reporting bruising has improved since ABG was taken. Capillary refill less than 2 seconds distally with distal sensation intact.  3. Back pain:  I encouraged patient to follow up with her primary care physician regarding her back pain-No neurological deficits and normal neuro exam.  Patient can walk but states is painful.  No loss of bowel or bladder control.  No concern for cauda equina.  No fever, night sweats, weight loss, h/o cancer, IVDU.  RICE protocol and pain medicine indicated and discussed with patient.   Do not see any evidence of acute pathology occurring tonight. Strongly encouraged patient to follow up with appropriate resources, and  discussed return precautions with patient. Patient verbalizes understanding and agreement this plan. I encouraged patient to call or return to the ER should she have any questions or concerns.  BP 122/81 mmHg  Pulse 72  Temp(Src) 98 F (36.7 C) (Oral)  Resp 16  SpO2 99%  LMP 11/28/2014  Signed,  Dahlia Bailiff, PA-C 12:17 AM  Patient seen and discussed with Dr. Sherwood Gambler, MD  Carrie Mew, PA-C 12/10/14 [id number]  Ephraim Hamburger, MD 12/13/14 [phone]

## 2014-12-09 NOTE — Discharge Instructions (Signed)
Chest Pain (Nonspecific) °It is often hard to give a specific diagnosis for the cause of chest pain. There is always a chance that your pain could be related to something serious, such as a heart attack or a blood clot in the lungs. You need to follow up with your health care provider for further evaluation. °CAUSES  °· Heartburn. °· Pneumonia or bronchitis. °· Anxiety or stress. °· Inflammation around your heart (pericarditis) or lung (pleuritis or pleurisy). °· A blood clot in the lung. °· A collapsed lung (pneumothorax). It can develop suddenly on its own (spontaneous pneumothorax) or from trauma to the chest. °· Shingles infection (herpes zoster virus). °The chest wall is composed of bones, muscles, and cartilage. Any of these can be the source of the pain. °· The bones can be bruised by injury. °· The muscles or cartilage can be strained by coughing or overwork. °· The cartilage can be affected by inflammation and become sore (costochondritis). °DIAGNOSIS  °Lab tests or other studies may be needed to find the cause of your pain. Your health care provider may have you take a test called an ambulatory electrocardiogram (ECG). An ECG records your heartbeat patterns over a 24-hour period. You may also have other tests, such as: °· Transthoracic echocardiogram (TTE). During echocardiography, sound waves are used to evaluate how blood flows through your heart. °· Transesophageal echocardiogram (TEE). °· Cardiac monitoring. This allows your health care provider to monitor your heart rate and rhythm in real time. °· Holter monitor. This is a portable device that records your heartbeat and can help diagnose heart arrhythmias. It allows your health care provider to track your heart activity for several days, if needed. °· Stress tests by exercise or by giving medicine that makes the heart beat faster. °TREATMENT  °· Treatment depends on what may be causing your chest pain. Treatment may include: °· Acid blockers for  heartburn. °· Anti-inflammatory medicine. °· Pain medicine for inflammatory conditions. °· Antibiotics if an infection is present. °· You may be advised to change lifestyle habits. This includes stopping smoking and avoiding alcohol, caffeine, and chocolate. °· You may be advised to keep your head raised (elevated) when sleeping. This reduces the chance of acid going backward from your stomach into your esophagus. °Most of the time, nonspecific chest pain will improve within 2-3 days with rest and mild pain medicine.  °HOME CARE INSTRUCTIONS  °· If antibiotics were prescribed, take them as directed. Finish them even if you start to feel better. °· For the next few days, avoid physical activities that bring on chest pain. Continue physical activities as directed. °· Do not use any tobacco products, including cigarettes, chewing tobacco, or electronic cigarettes. °· Avoid drinking alcohol. °· Only take medicine as directed by your health care provider. °· Follow your health care provider's suggestions for further testing if your chest pain does not go away. °· Keep any follow-up appointments you made. If you do not go to an appointment, you could develop lasting (chronic) problems with pain. If there is any problem keeping an appointment, call to reschedule. °SEEK MEDICAL CARE IF:  °· Your chest pain does not go away, even after treatment. °· You have a rash with blisters on your chest. °· You have a fever. °SEEK IMMEDIATE MEDICAL CARE IF:  °· You have increased chest pain or pain that spreads to your arm, neck, jaw, back, or abdomen. °· You have shortness of breath. °· You have an increasing cough, or you cough   up blood.  You have severe back or abdominal pain.  You feel nauseous or vomit.  You have severe weakness.  You faint.  You have chills. This is an emergency. Do not wait to see if the pain will go away. Get medical help at once. Call your local emergency services (911 in [place] Do not drive  yourself to the hospital. MAKE SURE YOU:   Understand these instructions.  Will watch your condition.  Will get help right away if you are not doing well or get worse. Document Released: 08/03/2005 Document Revised: 10/29/2013 Document Reviewed: 05/29/2008 [facility] Patient Information 2015 [place], [place]. This information is not intended to replace advice given to you by your health care provider. Make sure you discuss any questions you have with your health care provider.   Your signs and symptoms are not consistent with Deep Vein Thrombosis. However, hears some information and things to look out for.  A deep vein thrombosis (DVT) is a blood clot that develops in the deep, larger veins of the leg, arm, or pelvis. These are more dangerous than clots that might form in veins near the surface of the body. A DVT can lead to serious and even life-threatening complications if the clot breaks off and travels in the bloodstream to the lungs.  A DVT can damage the valves in your leg veins so that instead of flowing upward, the blood pools in the lower leg. This is called post-thrombotic syndrome, and it can result in pain, swelling, discoloration, and sores on the leg. CAUSES Usually, several things contribute to the formation of blood clots. Contributing factors include:  The flow of blood slows down.  The inside of the vein is damaged in some way.  You have a condition that makes blood clot more easily. RISK FACTORS Some people are more likely than others to develop blood clots. Risk factors include:   Smoking.  Being overweight (obese).  Sitting or lying still for a long time. This includes long-distance travel, paralysis, or recovery from an illness or surgery. Other factors that increase risk are:   Older age, especially over 70 years of age.  Having a family history of blood clots or if you have already had a blot clot.  Having major or lengthy surgery. This is especially true  for surgery on the hip, knee, or belly (abdomen). Hip surgery is particularly high risk.  Having a long, thin tube (catheter) placed inside a vein during a medical procedure.  Breaking a hip or leg.  Having cancer or cancer treatment.  Pregnancy and childbirth.  Hormone changes make the blood clot more easily during pregnancy.  The fetus puts pressure on the veins of the pelvis.  There is a risk of injury to veins during delivery or a caesarean delivery. The risk is highest just after childbirth.  Medicines containing the female hormone estrogen. This includes birth control pills and hormone replacement therapy.  Other circulation or heart problems.  SIGNS AND SYMPTOMS When a clot forms, it can either partially or totally block the blood flow in that vein. Symptoms of a DVT can include:  Swelling of the leg or arm, especially if one side is much worse.  Warmth and redness of the leg or arm, especially if one side is much worse.  Pain in an arm or leg. If the clot is in the leg, symptoms may be more noticeable or worse when standing or walking. The symptoms of a DVT that has traveled to the lungs (  pulmonary embolism, PE) usually start suddenly and include:  Shortness of breath.  Coughing.  Coughing up blood or blood-tinged mucus.  Chest pain. The chest pain is often worse with deep breaths.  Rapid heartbeat. Anyone with these symptoms should get emergency medical treatment right away. Do not wait to see if the symptoms will go away. Call your local emergency services (911 in the [place] if you have these symptoms. Do not drive yourself to the hospital. DIAGNOSIS If a DVT is suspected, your health care provider will take a full medical history and perform a physical exam. Tests that also may be required include:  Blood tests, including studies of the clotting properties of the blood.  Ultrasound to see if you have clots in your legs or lungs.  X-rays to show the flow of  blood when dye is injected into the veins (venogram).  Studies of your lungs if you have any chest symptoms. PREVENTION  Exercise the legs regularly. Take a brisk 30-minute walk every day.  Maintain a weight that is appropriate for your height.  Avoid sitting or lying in bed for long periods of time without moving your legs.  Women, particularly those over the age of 74 years, should consider the risks and benefits of taking estrogen medicines, including birth control pills.  Do not smoke, especially if you take estrogen medicines.  Long-distance travel can increase your risk of DVT. You should exercise your legs by walking or pumping the muscles every hour.  Many of the risk factors above relate to situations that exist with hospitalization, either for illness, injury, or elective surgery. Prevention may include medical and nonmedical measures.  Your health care provider will assess you for the need for venous thromboembolism prevention when you are admitted to the hospital. If you are having surgery, your surgeon will assess you the day of or day after surgery. TREATMENT Once identified, a DVT can be treated. It can also be prevented in some circumstances. Once you have had a DVT, you may be at increased risk for a DVT in the future. The most common treatment for DVT is blood-thinning (anticoagulant) medicine, which reduces the blood's tendency to clot. Anticoagulants can stop new blood clots from forming and stop old clots from growing. They cannot dissolve existing clots. Your body does this by itself over time. Anticoagulants can be given by mouth, through an IV tube, or by injection. Your health care provider will determine the best program for you. Other medicines or treatments that may be used are:  Heparin or related medicines (low molecular weight heparin) are often the first treatment for a blood clot. They act quickly. However, they cannot be taken orally and must be given either  in shot form or by IV tube.  Heparin can cause a fall in a component of blood that stops bleeding and forms blood clots (platelets). You will be monitored with blood tests to be sure this does not occur.  Warfarin is an anticoagulant that can be swallowed. It takes a few days to start working, so usually heparin or related medicines are used in combination. Once warfarin is working, heparin is usually stopped.  Factor Xa inhibitor medicines, such as rivaroxaban and apixaban, also reduce blood clotting. These medicines are taken orally and can often be used without heparin or related medicines.  Less commonly, clot dissolving drugs (thrombolytics) are used to dissolve a DVT. They carry a high risk of bleeding, so they are used mainly in severe cases where your  life or a part of your body is threatened.  Very rarely, a blood clot in the leg needs to be removed surgically.  If you are unable to take anticoagulants, your health care provider may arrange for you to have a filter placed in a main vein in your abdomen. This filter prevents clots from traveling to your lungs. HOME CARE INSTRUCTIONS  Take all medicines as directed by your health care provider.  Learn as much as you can about DVT.  Wear a medical alert bracelet or carry a medical alert card.  Ask your health care provider how soon you can go back to normal activities. It is important to stay active to prevent blood clots. If you are on anticoagulant medicine, avoid contact sports.  It is very important to exercise. This is especially important while traveling, sitting, or standing for long periods of time. Exercise your legs by walking or by tightening and relaxing your leg muscles regularly. Take frequent walks.  You may need to wear compression stockings. These are tight elastic stockings that apply pressure to the lower legs. This pressure can help keep the blood in the legs from clotting. Taking Warfarin Warfarin is a daily  medicine that is taken by mouth. Your health care provider will advise you on the length of treatment (usually 3-6 months, sometimes lifelong). If you take warfarin:  Understand how to take warfarin and foods that can affect how warfarin works in Veterinary surgeon.  Too much and too little warfarin are both dangerous. Too much warfarin increases the risk of bleeding. Too little warfarin continues to allow the risk for blood clots. Warfarin and Regular Blood Testing While taking warfarin, you will need to have regular blood tests to measure your blood clotting time. These blood tests usually include both the prothrombin time (PT) and international normalized ratio (INR) tests. The PT and INR results allow your health care provider to adjust your dose of warfarin. It is very important that you have your PT and INR tested as often as directed by your health care provider.  Warfarin and Your Diet Avoid major changes in your diet, or notify your health care provider before changing your diet. Arrange a visit with a registered dietitian to answer your questions. Many foods, especially foods high in vitamin K, can interfere with warfarin and affect the PT and INR results. You should eat a consistent amount of foods high in vitamin K. Foods high in vitamin K include:   Spinach, kale, broccoli, cabbage, collard and turnip greens, Brussels sprouts, peas, cauliflower, seaweed, and parsley.  Beef and pork liver.  Green tea.  Soybean oil. Warfarin with Other Medicines Many medicines can interfere with warfarin and affect the PT and INR results. You must:  Tell your health care provider about any and all medicines, vitamins, and supplements you take, including aspirin and other over-the-counter anti-inflammatory medicines. Be especially cautious with aspirin and anti-inflammatory medicines. Ask your health care provider before taking these.  Do not take or discontinue any prescribed or over-the-counter medicine  except on the advice of your health care provider or pharmacist. Warfarin Side Effects Warfarin can have side effects, such as easy bruising and difficulty stopping bleeding. Ask your health care provider or pharmacist about other side effects of warfarin. You will need to:  Hold pressure over cuts for longer than usual.  Notify your dentist and other health care providers that you are taking warfarin before you undergo any procedures where bleeding may occur. Warfarin with Alcohol  and Tobacco   Drinking alcohol frequently can increase the effect of warfarin, leading to excess bleeding. It is best to avoid alcoholic drinks or to consume only very small amounts while taking warfarin. Notify your health care provider if you change your alcohol intake.   Do not use any tobacco products including cigarettes, chewing tobacco, or electronic cigarettes. If you smoke, quit. Ask your health care provider for help with quitting smoking. Alternative Medicines to Warfarin: Factor Xa Inhibitor Medicines  These blood-thinning medicines are taken by mouth, usually for several weeks or longer. It is important to take the medicine every single day at the same time each day.  There are no regular blood tests required when using these medicines.  There are fewer food and drug interactions than with warfarin.  The side effects of this class of medicine are similar to those of warfarin, including excessive bruising or bleeding. Ask your health care provider or pharmacist about other potential side effects. SEEK MEDICAL CARE IF:  You notice a rapid heartbeat.  You feel weaker or more tired than usual.  You feel faint.  You notice increased bruising.  You feel your symptoms are not getting better in the time expected.  You believe you are having side effects of medicine. SEEK IMMEDIATE MEDICAL CARE IF:  You have chest pain.  You have trouble breathing.  You have new or increased swelling or pain in  one leg.  You cough up blood.  You notice blood in vomit, in a bowel movement, or in urine. MAKE SURE YOU:  Understand these instructions.  Will watch your condition.  Will get help right away if you are not doing well or get worse. Document Released: 10/24/2005 Document Revised: 03/10/2014 Document Reviewed: 07/01/2013 [facility] Patient Information 2015 [place], [place]. This information is not intended to replace advice given to you by your health care provider. Make sure you discuss any questions you have with your health care provider.

## 2014-12-09 NOTE — Telephone Encounter (Signed)
Encounter complete. 

## 2014-12-09 NOTE — ED Notes (Signed)
Pt presents with c/o chest pain, back pain, and arm swelling. Pt reports the symptoms have been going on since the last time she was seen here on the 27th. Pt reports her arm has been swollen after she got an IV last time she was here and a hematoma that has since gone away from the blood draw. Pt also c/o rash on her upper torso and on her arm.

## 2014-12-10 ENCOUNTER — Encounter (HOSPITAL_COMMUNITY): Payer: Self-pay

## 2014-12-10 ENCOUNTER — Ambulatory Visit (HOSPITAL_COMMUNITY)
Admission: RE | Admit: 2014-12-10 | Discharge: 2014-12-10 | Disposition: A | Discharge status: Home / Self Care | Payer: 59 | Source: Ambulatory Visit | Attending: Cardiology | Admitting: Cardiology

## 2014-12-10 ENCOUNTER — Emergency Department (HOSPITAL_COMMUNITY)
Admission: EM | Admit: 2014-12-10 | Discharge: 2014-12-10 | Disposition: A | Discharge status: Home / Self Care | Payer: 59 | Attending: Emergency Medicine | Admitting: Emergency Medicine

## 2014-12-10 ENCOUNTER — Emergency Department (HOSPITAL_COMMUNITY): Payer: 59

## 2014-12-10 ENCOUNTER — Other Ambulatory Visit: Payer: Self-pay

## 2014-12-10 DIAGNOSIS — Z8742 Personal history of other diseases of the female genital tract: Secondary | ICD-10-CM | POA: Insufficient documentation

## 2014-12-10 DIAGNOSIS — R079 Chest pain, unspecified: Secondary | ICD-10-CM | POA: Diagnosis present

## 2014-12-10 DIAGNOSIS — Z880 Allergy status to penicillin: Secondary | ICD-10-CM | POA: Insufficient documentation

## 2014-12-10 DIAGNOSIS — Z87891 Personal history of nicotine dependence: Secondary | ICD-10-CM | POA: Diagnosis not present

## 2014-12-10 DIAGNOSIS — R42 Dizziness and giddiness: Secondary | ICD-10-CM

## 2014-12-10 DIAGNOSIS — E119 Type 2 diabetes mellitus without complications: Secondary | ICD-10-CM | POA: Insufficient documentation

## 2014-12-10 DIAGNOSIS — Z3202 Encounter for pregnancy test, result negative: Secondary | ICD-10-CM | POA: Diagnosis not present

## 2014-12-10 DIAGNOSIS — R0602 Shortness of breath: Secondary | ICD-10-CM | POA: Insufficient documentation

## 2014-12-10 DIAGNOSIS — R0789 Other chest pain: Secondary | ICD-10-CM

## 2014-12-10 DIAGNOSIS — I10 Essential (primary) hypertension: Secondary | ICD-10-CM | POA: Insufficient documentation

## 2014-12-10 DIAGNOSIS — Z79899 Other long term (current) drug therapy: Secondary | ICD-10-CM | POA: Diagnosis not present

## 2014-12-10 DIAGNOSIS — K219 Gastro-esophageal reflux disease without esophagitis: Secondary | ICD-10-CM | POA: Diagnosis not present

## 2014-12-10 LAB — BASIC METABOLIC PANEL
Anion gap: 9 (ref 5–15)
BUN: 16 mg/dL (ref 6–23)
CO2: 26 mmol/L (ref 19–32)
Calcium: 9.4 mg/dL (ref 8.4–10.5)
Chloride: 104 mmol/L (ref 96–112)
Creatinine, Ser: 0.94 mg/dL (ref 0.50–1.10)
GFR calc Af Amer: 87 mL/min — ABNORMAL LOW (ref >90)
GFR calc non Af Amer: 75 mL/min — ABNORMAL LOW (ref >90)
Glucose, Bld: 115 mg/dL — ABNORMAL HIGH (ref 70–99)
Potassium: 4.5 mmol/L (ref 3.5–5.1)
Sodium: 139 mmol/L (ref 135–145)

## 2014-12-10 LAB — CBC
HCT: 40.1 % (ref 36.0–46.0)
Hemoglobin: 13.3 g/dL (ref 12.0–15.0)
MCH: 29.2 pg (ref 26.0–34.0)
MCHC: 33.2 g/dL (ref 30.0–36.0)
MCV: 88.1 fL (ref 78.0–100.0)
Platelets: 312 10*3/uL (ref 150–400)
RBC: 4.55 MIL/uL (ref 3.87–5.11)
RDW: 12.5 % (ref 11.5–15.5)
WBC: 12.1 10*3/uL — ABNORMAL HIGH (ref 4.0–10.5)

## 2014-12-10 LAB — PREGNANCY, URINE: Preg Test, Ur: NEGATIVE

## 2014-12-10 LAB — URINALYSIS, ROUTINE W REFLEX MICROSCOPIC
Bilirubin Urine: NEGATIVE
Glucose, UA: NEGATIVE mg/dL
Hgb urine dipstick: NEGATIVE
Ketones, ur: NEGATIVE mg/dL
Leukocytes, UA: NEGATIVE
Nitrite: NEGATIVE
Protein, ur: NEGATIVE mg/dL
Specific Gravity, Urine: 1.009 (ref 1.005–1.030)
Urobilinogen, UA: 0.2 mg/dL (ref 0.0–1.0)
pH: 7 (ref 5.0–8.0)

## 2014-12-10 LAB — I-STAT TROPONIN, ED: Troponin i, poc: 0 ng/mL (ref 0.00–0.08)

## 2014-12-10 LAB — POC URINE PREG, ED: Preg Test, Ur: NEGATIVE

## 2014-12-10 MED ORDER — IOHEXOL 350 MG/ML SOLN
100.0000 mL | Freq: Once | INTRAVENOUS | Status: AC | PRN
  Administered 2014-12-10: 100 mL via INTRAVENOUS

## 2014-12-10 MED ORDER — SODIUM CHLORIDE 0.9 % IV BOLUS (SEPSIS)
1000.0000 mL | Freq: Once | INTRAVENOUS | Status: AC
  Administered 2014-12-10: 1000 mL via INTRAVENOUS

## 2014-12-10 MED ORDER — LORAZEPAM 2 MG/ML IJ SOLN
1.0000 mg | Freq: Once | INTRAMUSCULAR | Status: AC
  Administered 2014-12-10: 1 mg via INTRAVENOUS
  Filled 2014-12-10: qty 1

## 2014-12-10 NOTE — ED Notes (Addendum)
Pt c/o dizziness, near syncope, L jaw pain, L arm numbness, chest tightness, and nausea during a stress test this afternoon.  Pain score 6/10.  Pt reports that her cardiologist stated that "he doesn't think it has anything to do w/ my heart."  Pt reports similar episode before starting in October, but not to this severity.

## 2014-12-10 NOTE — ED Provider Notes (Signed)
CSN: [id number]     Arrival date & time 12/10/14  1746 History   First MD Initiated Contact with Patient 12/10/14 1930     Chief Complaint  Patient presents with  . Dizziness  . Nausea     (Consider location/radiation/quality/duration/timing/severity/associated sxs/prior Treatment) The history is provided by the patient.  Brooke Turner is a 39 y.o. female hx of HTN, GERD, DM, here with dizziness, near syncope. She's been having intermittent dizziness and palpitations and near syncope for the last 4 months. Started October after she had a drink mixed with cocaine. Has intermittent episodes since then. His numerous evaluations in the ED and primary care. She had normal Holter monitor, normal echo, normal labs including TSH and d-dimer. She was seen yesterday for the same complaint. She went to see her cardiologist today to get a stress test. At the end of the stress test she suddenly had some lightheadedness and dizziness and palpitations as well as left-sided arm numbness. Her pain has resolved currently. She was told that her stress test was normal and that this is unlikely cardiac in origin.    Past Medical History  Diagnosis Date  . Hypertension   . GERD (gastroesophageal reflux disease)   . Tachycardia   . Diabetes mellitus without complication   . Fibroid uterus    Past Surgical History  Procedure Laterality Date  . Wisdom tooth extraction     Family History  Problem Relation Age of Onset  . Seizures Mother   . Cancer Father     Liver  . Diabetes Father 45  . Hypertension Father   . Heart disease Father 51    CEA, LE Stenting  . Alzheimer's disease Maternal Grandmother   . Heart disease Maternal Grandfather    History  Substance Use Topics  . Smoking status: Former Smoker    Types: Cigarettes    Quit date: 08/19/2014  . Smokeless tobacco: Never Used     Comment: Quit smoking last month  . Alcohol Use: 0.0 oz/week    0 Not specified per week     Comment:  Usually 1 time a month   OB History    Gravida Para Term Preterm AB TAB SAB Ectopic Multiple Living   2 0 0 0 2 1 1 0 0 0      Review of Systems  Cardiovascular: Positive for palpitations.  Neurological: Positive for dizziness.  All other systems reviewed and are negative.     Allergies  Penicillins and Tamiflu  Home Medications   Prior to Admission medications   Medication Sig Start Date End Date Taking? Authorizing Provider  busPIRone (BUSPAR) 5 MG tablet Take 1 tablet (5 mg total) by mouth 3 (three) times daily. Patient taking differently: Take 5 mg by mouth 2 (two) times daily.  10/22/14  Yes [name], MD  cyclobenzaprine (FLEXERIL) 10 MG tablet Take 1 tablet (10 mg total) by mouth 3 (three) times daily as needed for muscle spasms (or pain). 11/24/14  Yes Clayton Bibles, PA-C  ergocalciferol (VITAMIN D2) 50000 UNITS capsule Take 1 capsule (50,000 Units total) by mouth once a week. Patient taking differently: Take 50,000 Units by mouth every Friday.  11/02/14  Yes [name], MD  fluticasone (FLONASE) 50 MCG/ACT nasal spray Place 1 spray into both nostrils daily as needed for allergies or rhinitis. 10/15/14  Yes Chelle S Jeffery, PA-C  hydroxypropyl methylcellulose / hypromellose (ISOPTO TEARS / GONIOVISC) 2.5 % ophthalmic solution Place 1 drop into both eyes  3 (three) times daily as needed for dry eyes.   Yes Historical Provider, MD  ibuprofen (ADVIL,MOTRIN) 800 MG tablet Take 1 tablet (800 mg total) by mouth every 8 (eight) hours as needed for mild pain or moderate pain. Patient taking differently: Take 800 mg by mouth every 8 (eight) hours as needed for mild pain or moderate pain. pain 11/24/14  Yes Clayton Bibles, PA-C  norethindrone (MICRONOR,CAMILA,ERRIN) 0.35 MG tablet Take 1 tablet by mouth daily.   Yes Historical Provider, MD  sodium chloride (OCEAN) 0.65 % SOLN nasal spray Place 2 sprays into both nostrils as needed for congestion. 12/03/14  Yes Lauren Parker, PA-C  VENTOLIN HFA  108 (90 BASE) MCG/ACT inhaler Inhale 1 puff into the lungs every 4 (four) hours as needed for wheezing or shortness of breath (wheezing & shortness of breath).  10/09/14  Yes Historical Provider, MD  dextromethorphan-guaiFENesin (MUCINEX DM) 30-600 MG per 12 hr tablet Take 1 tablet by mouth 2 (two) times daily. 12/03/14   Harvie Heck, PA-C  diltiazem (CARDIZEM CD) 120 MG 24 hr capsule Take 1 capsule (120 mg total) by mouth daily. 11/21/14   Minus Breeding, MD  esomeprazole (NEXIUM) 40 MG capsule Take 1 capsule (40 mg total) by mouth daily. Patient not taking: Reported on 12/09/2014 10/09/14   [name], MD   BP 148/81 mmHg  Pulse 88  Temp(Src) 98.2 F (36.8 C) (Oral)  Resp 20  SpO2 99%  LMP 11/28/2014 Physical Exam  Constitutional: She is oriented to person, place, and time.  Anxious   HENT:  Head: Normocephalic.  Mouth/Throat: Oropharynx is clear and moist.  Eyes: Conjunctivae and EOM are normal. Pupils are equal, round, and reactive to light.  Neck: Normal range of motion. Neck supple.  Cardiovascular: Regular rhythm and normal heart sounds.   Mildly tachy   Pulmonary/Chest: Effort normal and breath sounds normal. No respiratory distress. She has no wheezes. She has no rales.  Abdominal: Soft. Bowel sounds are normal. She exhibits no distension. There is no tenderness. There is no rebound.  Musculoskeletal: Normal range of motion. She exhibits no edema or tenderness.  Neurological: She is alert and oriented to person, place, and time. No cranial nerve deficit. Coordination normal.  Skin: Skin is warm and dry.  Psychiatric: She has a normal mood and affect. Her behavior is normal. Judgment and thought content normal.  Nursing note and vitals reviewed.   ED Course  Procedures (including critical care time) Labs Review Labs Reviewed  CBC - Abnormal; Notable for the following:    WBC 12.1 (*)    All other components within normal limits  BASIC METABOLIC PANEL - Abnormal; Notable for  the following:    Glucose, Bld 115 (*)    GFR calc non Af Amer 75 (*)    GFR calc Af Amer 87 (*)    All other components within normal limits  PREGNANCY, URINE  URINALYSIS, ROUTINE W REFLEX MICROSCOPIC  POC URINE PREG, ED  Randolm Idol, ED    Imaging Review Dg Chest 2 View  12/09/2014   CLINICAL DATA:  Chest tightness. Shortness of breath and upper back pain.  EXAM: CHEST  2 VIEW  COMPARISON:  11/24/2014  FINDINGS: The heart size and mediastinal contours are within normal limits. Both lungs are clear. The visualized skeletal structures are unremarkable.  IMPRESSION: Normal exam.   Electronically Signed   By: Rozetta Nunnery M.D.   On: 12/09/2014 20:38   Ct Angio Chest Pe W/cm &/or Wo  Cm  12/10/2014   CLINICAL DATA:  Shortness of breath.  Dizziness and near syncope.  EXAM: CT ANGIOGRAPHY CHEST WITH CONTRAST  TECHNIQUE: Multidetector CT imaging of the chest was performed using the standard protocol during bolus administration of intravenous contrast. Multiplanar CT image reconstructions and MIPs were obtained to evaluate the vascular anatomy.  CONTRAST:  132mL OMNIPAQUE IOHEXOL 350 MG/ML SOLN  COMPARISON:  None.  FINDINGS: THORACIC INLET/BODY WALL:  No acute abnormality.  MEDIASTINUM:  Normal heart size. No pericardial effusion. No acute vascular abnormality, including aortic dissection or pulmonary embolism. No adenopathy.  LUNG WINDOWS:  No consolidation.  No effusion.  No suspicious pulmonary nodule.  UPPER ABDOMEN:  No acute findings.  OSSEOUS:  No acute fracture.  No suspicious lytic or blastic lesions.  Review of the MIP images confirms the above findings.  IMPRESSION: No evidence of pulmonary embolism or other cause for shortness of breath.   Electronically Signed   By: Jorje Guild M.D.   On: 12/10/2014 22:33     EKG Interpretation None      MDM   Final diagnoses:  Shortness of breath   Brooke Turner is a 39 y.o. female here with dizziness, chest pain during stress  test. On arrival, mildly tachy. D-dimer neg 2 weeks ago. She had extensive workup in the past. I told her that we are unlikely to find a reason for her dizziness and chest pain. I think likely related to anxiety. Given that she is slightly tachy, will get CT angio to r/o small PE.   10:39 PM Labs at baseline. CT showed no PE. Not orthostatic. Can dc home.     Wandra Arthurs, MD 12/10/14 [phone]

## 2014-12-10 NOTE — Procedures (Signed)
Exercise Treadmill Test  Test  Exercise Tolerance Test Ordering MD: Marijo File, MD  Interpreting MD:   Unique Test No:1 Treadmill:  1  Indication for ETT: chest pain - rule out ischemia  Contraindication to ETT: No   Stress Modality: exercise - treadmill  Cardiac Imaging Performed: non   Protocol: standard Bruce - maximal  Max BP:  202/96  Max MPHR (bpm):  181 85% MPR (bpm): 153  MPHR obtained (bpm):  184 % MPHR obtained:  101  Reached 85% MPHR (min:sec):  5:00 Total Exercise Time (min-sec):  7:57  Workload in METS: 9.90 Borg Scale:  Reason ETT Terminated:  dizziness,dyspnea,    ST Segment Analysis At Rest: NSR, no ST changes With Exercise: no evidence of significant ST depression  Other Information Arrhythmia:  No Angina during ETT:  Developed CP in recovery Quality of ETT:  diagnostic  ETT Interpretation:  normal - no evidence of ischemia by ST analysis  Comments: ETT with fair exercise tolerance (7:57); normal BP response; developed CP in recovery; no ST changes; negative adequate ETT. Kirk Ruths

## 2014-12-10 NOTE — Discharge Instructions (Signed)
Take your medicines as prescribed.   Follow up with your doctor.   Return to ER if you have severe chest pain, shortness of breath, passing out.

## 2014-12-11 ENCOUNTER — Ambulatory Visit (INDEPENDENT_AMBULATORY_CARE_PROVIDER_SITE_OTHER): Payer: 59 | Admitting: Family Medicine

## 2014-12-11 VITALS — BP 118/80 | HR 78 | Temp 97.8°F | Resp 16 | Ht 63.0 in | Wt 199.0 lb

## 2014-12-11 DIAGNOSIS — R42 Dizziness and giddiness: Secondary | ICD-10-CM

## 2014-12-11 DIAGNOSIS — R0789 Other chest pain: Secondary | ICD-10-CM

## 2014-12-11 DIAGNOSIS — R0600 Dyspnea, unspecified: Secondary | ICD-10-CM

## 2014-12-11 DIAGNOSIS — R002 Palpitations: Secondary | ICD-10-CM

## 2014-12-11 DIAGNOSIS — R1084 Generalized abdominal pain: Secondary | ICD-10-CM

## 2014-12-11 DIAGNOSIS — H539 Unspecified visual disturbance: Secondary | ICD-10-CM

## 2014-12-11 NOTE — Telephone Encounter (Signed)
Patient called to check the status of her paperwork. Almost finished? I am in disabilities department this afternoon. I can fax it.

## 2014-12-11 NOTE — Telephone Encounter (Signed)
Yes, finished paperwork and gave to cma melanie to return to front.  Pt seen in clinic today and informed.

## 2014-12-12 ENCOUNTER — Encounter (HOSPITAL_COMMUNITY): Payer: 59

## 2014-12-12 ENCOUNTER — Other Ambulatory Visit: Payer: Self-pay | Admitting: Family Medicine

## 2014-12-13 ENCOUNTER — Emergency Department (HOSPITAL_COMMUNITY)
Admission: EM | Admit: 2014-12-13 | Discharge: 2014-12-13 | Disposition: A | Discharge status: Home / Self Care | Payer: 59 | Attending: Emergency Medicine | Admitting: Emergency Medicine

## 2014-12-13 ENCOUNTER — Telehealth: Payer: Self-pay

## 2014-12-13 ENCOUNTER — Encounter (HOSPITAL_COMMUNITY): Payer: Self-pay | Admitting: Emergency Medicine

## 2014-12-13 DIAGNOSIS — Z8719 Personal history of other diseases of the digestive system: Secondary | ICD-10-CM | POA: Diagnosis not present

## 2014-12-13 DIAGNOSIS — E119 Type 2 diabetes mellitus without complications: Secondary | ICD-10-CM | POA: Insufficient documentation

## 2014-12-13 DIAGNOSIS — I10 Essential (primary) hypertension: Secondary | ICD-10-CM | POA: Insufficient documentation

## 2014-12-13 DIAGNOSIS — Z87891 Personal history of nicotine dependence: Secondary | ICD-10-CM | POA: Insufficient documentation

## 2014-12-13 DIAGNOSIS — F419 Anxiety disorder, unspecified: Secondary | ICD-10-CM

## 2014-12-13 DIAGNOSIS — Z79899 Other long term (current) drug therapy: Secondary | ICD-10-CM | POA: Insufficient documentation

## 2014-12-13 DIAGNOSIS — Z86018 Personal history of other benign neoplasm: Secondary | ICD-10-CM | POA: Insufficient documentation

## 2014-12-13 DIAGNOSIS — R42 Dizziness and giddiness: Secondary | ICD-10-CM

## 2014-12-13 DIAGNOSIS — Z3202 Encounter for pregnancy test, result negative: Secondary | ICD-10-CM | POA: Diagnosis not present

## 2014-12-13 DIAGNOSIS — Z880 Allergy status to penicillin: Secondary | ICD-10-CM | POA: Insufficient documentation

## 2014-12-13 DIAGNOSIS — F410 Panic disorder [episodic paroxysmal anxiety] without agoraphobia: Secondary | ICD-10-CM | POA: Diagnosis present

## 2014-12-13 LAB — I-STAT TROPONIN, ED: Troponin i, poc: 0 ng/mL (ref 0.00–0.08)

## 2014-12-13 LAB — I-STAT CHEM 8, ED
BUN: 11 mg/dL (ref 6–23)
Calcium, Ion: 1.13 mmol/L (ref 1.12–1.23)
Chloride: 103 mmol/L (ref 96–112)
Creatinine, Ser: 0.9 mg/dL (ref 0.50–1.10)
Glucose, Bld: 181 mg/dL — ABNORMAL HIGH (ref 70–99)
HCT: 41 % (ref 36.0–46.0)
Hemoglobin: 13.9 g/dL (ref 12.0–15.0)
Potassium: 3.5 mmol/L (ref 3.5–5.1)
Sodium: 139 mmol/L (ref 135–145)
TCO2: 21 mmol/L (ref 0–100)

## 2014-12-13 LAB — I-STAT BETA HCG BLOOD, ED (MC, WL, AP ONLY): I-stat hCG, quantitative: <5 m[IU]/mL (ref <5)

## 2014-12-13 MED ORDER — LORAZEPAM 1 MG PO TABS
1.0000 mg | ORAL_TABLET | Freq: Once | ORAL | Status: AC
  Administered 2014-12-13: 1 mg via ORAL
  Filled 2014-12-13: qty 1

## 2014-12-13 NOTE — ED Provider Notes (Signed)
CSN: [id number]     Arrival date & time 12/13/14  0133 History   First MD Initiated Contact with Patient 12/13/14 0210     Chief Complaint  Patient presents with  . Panic Attack    (Consider location/radiation/quality/duration/timing/severity/associated sxs/prior Treatment) HPI Comments: Patient is a 39 year old female who presents to the emergency department for further evaluation of palpitations. Patient states that she has had similar symptoms multiple times in the past. She denies any changes in her symptoms from her prior presentations of anxiety. She states that she feels a fleeting sensation of lightheadedness followed by palpitations which are rapid and sometimes irregular. Patient states that her symptoms worsened prior to arrival and she could not get her heart rate to slow down. She tried deep breathing and calming without improvement. She does have Ativan to take as needed for anxiety, but she did not take this prior to presentation to the ED. She states that her symptoms were well controlled after starting BuSpar, but she has had some worsening ever since her cardiac stress test on 12/09/2014. No associated syncope, fever, vomiting, extremity numbness/paresthesias, and extremity weakness.  The history is provided by the patient. No language interpreter was used.    Past Medical History  Diagnosis Date  . Hypertension   . GERD (gastroesophageal reflux disease)   . Tachycardia   . Diabetes mellitus without complication   . Fibroid uterus    Past Surgical History  Procedure Laterality Date  . Wisdom tooth extraction     Family History  Problem Relation Age of Onset  . Seizures Mother   . Cancer Father     Liver  . Diabetes Father 22  . Hypertension Father   . Heart disease Father 34    CEA, LE Stenting  . Alzheimer's disease Maternal Grandmother   . Heart disease Maternal Grandfather    History  Substance Use Topics  . Smoking status: Former Smoker    Types:  Cigarettes    Quit date: 08/19/2014  . Smokeless tobacco: Never Used     Comment: Quit smoking last month  . Alcohol Use: 0.0 oz/week    0 Not specified per week     Comment: Usually 1 time a month   OB History    Gravida Para Term Preterm AB TAB SAB Ectopic Multiple Living   2 0 0 0 2 1 1 0 0 0       Review of Systems  Constitutional: Negative for fever.  Respiratory: Positive for shortness of breath.   Cardiovascular: Positive for palpitations.  Gastrointestinal: Negative for vomiting.  Neurological: Positive for light-headedness. Negative for syncope.  All other systems reviewed and are negative.   Allergies  Penicillins and Tamiflu  Home Medications   Prior to Admission medications   Medication Sig Start Date End Date Taking? Authorizing Provider  busPIRone (BUSPAR) 5 MG tablet Take 1 tablet (5 mg total) by mouth 3 (three) times daily. Patient taking differently: Take 5 mg by mouth 2 (two) times daily.  10/22/14  Yes [name], MD  cyclobenzaprine (FLEXERIL) 10 MG tablet Take 1 tablet (10 mg total) by mouth 3 (three) times daily as needed for muscle spasms (or pain). 11/24/14  Yes Clayton Bibles, PA-C  hydroxypropyl methylcellulose / hypromellose (ISOPTO TEARS / GONIOVISC) 2.5 % ophthalmic solution Place 1 drop into both eyes 3 (three) times daily as needed for dry eyes.   Yes Historical Provider, MD  norethindrone (MICRONOR,CAMILA,ERRIN) 0.35 MG tablet Take 1 tablet by mouth daily.  Yes Historical Provider, MD  VENTOLIN HFA 108 (90 BASE) MCG/ACT inhaler Inhale 1 puff into the lungs every 4 (four) hours as needed for wheezing or shortness of breath (wheezing & shortness of breath).  10/09/14  Yes Historical Provider, MD  dextromethorphan-guaiFENesin (MUCINEX DM) 30-600 MG per 12 hr tablet Take 1 tablet by mouth 2 (two) times daily. 12/03/14   Harvie Heck, PA-C  diltiazem (CARDIZEM CD) 120 MG 24 hr capsule Take 1 capsule (120 mg total) by mouth daily. 11/21/14   Minus Breeding, MD   ergocalciferol (VITAMIN D2) 50000 UNITS capsule Take 1 capsule (50,000 Units total) by mouth once a week. Patient taking differently: Take 50,000 Units by mouth every Friday.  11/02/14   [name], MD  fluticasone (FLONASE) 50 MCG/ACT nasal spray Place 1 spray into both nostrils daily as needed for allergies or rhinitis. 10/15/14   Chelle S Jeffery, PA-C  sodium chloride (OCEAN) 0.65 % SOLN nasal spray Place 2 sprays into both nostrils as needed for congestion. Patient not taking: Reported on 12/11/2014 12/03/14   Harvie Heck, PA-C   BP 159/80 mmHg  Pulse 88  Temp(Src) 97.9 F (36.6 C) (Oral)  Resp 16  SpO2 98%  LMP 12/10/2014 (Approximate)   Physical Exam  Constitutional: She is oriented to person, place, and time. She appears well-developed and well-nourished. No distress.  Nontoxic/nonseptic appearing  HENT:  Head: Normocephalic and atraumatic.  Eyes: Conjunctivae and EOM are normal. No scleral icterus.  Neck: Normal range of motion.  Cardiovascular: Normal rate, regular rhythm, normal heart sounds and intact distal pulses.   Pulmonary/Chest: Effort normal and breath sounds normal. No respiratory distress. She has no wheezes. She has no rales.  Respirations even and unlabored  Musculoskeletal: Normal range of motion.  Neurological: She is alert and oriented to person, place, and time. She exhibits normal muscle tone. Coordination normal.  Skin: Skin is warm and dry. No rash noted. She is not diaphoretic. No erythema. No pallor.  Psychiatric: Her speech is normal and behavior is normal. Her mood appears anxious.  Nursing note and vitals reviewed.   ED Course  Procedures (including critical care time) Labs Review Labs Reviewed  I-STAT CHEM 8, ED - Abnormal; Notable for the following:    Glucose, Bld 181 (*)    All other components within normal limits  I-STAT TROPOININ, ED  I-STAT BETA HCG BLOOD, ED ([facility], [facility], [facility] ONLY)    Imaging Review Ct Angio Chest Pe W/cm &/or Wo  Cm  12/10/2014   CLINICAL DATA:  Shortness of breath.  Dizziness and near syncope.  EXAM: CT ANGIOGRAPHY CHEST WITH CONTRAST  TECHNIQUE: Multidetector CT imaging of the chest was performed using the standard protocol during bolus administration of intravenous contrast. Multiplanar CT image reconstructions and MIPs were obtained to evaluate the vascular anatomy.  CONTRAST:  186mL OMNIPAQUE IOHEXOL 350 MG/ML SOLN  COMPARISON:  None.  FINDINGS: THORACIC INLET/BODY WALL:  No acute abnormality.  MEDIASTINUM:  Normal heart size. No pericardial effusion. No acute vascular abnormality, including aortic dissection or pulmonary embolism. No adenopathy.  LUNG WINDOWS:  No consolidation.  No effusion.  No suspicious pulmonary nodule.  UPPER ABDOMEN:  No acute findings.  OSSEOUS:  No acute fracture.  No suspicious lytic or blastic lesions.  Review of the MIP images confirms the above findings.  IMPRESSION: No evidence of pulmonary embolism or other cause for shortness of breath.   Electronically Signed   By: Jorje Guild M.D.   On: 12/10/2014 22:33  EKG Interpretation   Date/Time:  Saturday December 13 2014 01:44:20 EST Ventricular Rate:  134 PR Interval:  159 QRS Duration: 77 QT Interval:  280 QTC Calculation: 418 R Axis:   68 Text Interpretation:  Sinus tachycardia Low voltage, precordial leads  Minimal ST depression, anterolateral leads ST elevation, consider inferior  injury Rate is faster Confirmed by MOLPUS  MD, Jenny Reichmann ([id number]) on 12/13/2014  3:53:24 AM      MDM   Final diagnoses:  Anxiety    39 year old female presents to the emergency department for palpitations, shortness of breath, and lightheadedness consistent with likely anxiety. Patient has been seeing a cardiologist for symptoms with a negative Holter monitor and negative stress test. Patient had a negative laboratory workup today. EKG shows sinus tachycardia. No evidence of ischemic change. Troponin negative. Doubt PE; negative CT angio  from 2 days ago reviewed and is negative.  Have advised patient of the use of Ativan in situations of acute anxiety. Also recommended that she follow-up with her primary care provider and cardiologist regarding her symptoms today. Return precautions provided. Patient agreeable to plan with no unaddressed concerns. Patient discharged in good condition.   Filed Vitals:   12/13/14 0445 12/13/14 0453 12/13/14 0524 12/13/14 0534  BP:  135/67 144/76 159/80  Pulse: 78  83 88  Temp:  98.3 F (36.8 C) 98.3 F (36.8 C) 97.9 F (36.6 C)  TempSrc:  Oral Oral Oral  Resp: 20  17 16   SpO2: 99%  99% 98%     Antonietta Breach, PA-C 12/13/14 1856  Wynetta Fines, MD 12/13/14 [phone]

## 2014-12-13 NOTE — Telephone Encounter (Signed)
The patient called to ask if her prescriptions for antacids and Lorazepam could be written instead of sent to a pharmacy.  She saw Dr. Brigitte Pulse on 12/11/14, and she said that Dr. Brigitte Pulse was going to call them in.  She said she's been to the pharmacy twice, and they have no record of the prescriptions, so she said she would prefer to pick them up in the office.  CB#: [phone]

## 2014-12-13 NOTE — Discharge Instructions (Signed)
Continue taking BuSpar as prescribed by your primary doctor. You may take 0.5-1 mg Ativan every 8 hours as needed for anxiety. Follow-up with her primary care doctor for a recheck of symptoms. Recommend deep breathing if symptoms recur or worsen prior to taking Ativan.  Panic Attacks Panic attacks are sudden, short-livedsurges of severe anxiety, fear, or discomfort. They may occur for no reason when you are relaxed, when you are anxious, or when you are sleeping. Panic attacks may occur for a number of reasons:   Healthy people occasionally have panic attacks in extreme, life-threatening situations, such as war or natural disasters. Normal anxiety is a protective mechanism of the body that helps [place] react to danger (fight or flight response).  Panic attacks are often seen with anxiety disorders, such as panic disorder, social anxiety disorder, generalized anxiety disorder, and phobias. Anxiety disorders cause excessive or uncontrollable anxiety. They may interfere with your relationships or other life activities.  Panic attacks are sometimes seen with other mental illnesses, such as depression and posttraumatic stress disorder.  Certain medical conditions, prescription medicines, and drugs of abuse can cause panic attacks. SYMPTOMS  Panic attacks start suddenly, peak within 20 minutes, and are accompanied by four or more of the following symptoms:  Pounding heart or fast heart rate (palpitations).  Sweating.  Trembling or shaking.  Shortness of breath or feeling smothered.  Feeling choked.  Chest pain or discomfort.  Nausea or strange feeling in your stomach.  Dizziness, light-headedness, or feeling like you will faint.  Chills or hot flushes.  Numbness or tingling in your lips or hands and feet.  Feeling that things are not real or feeling that you are not yourself.  Fear of losing control or going crazy.  Fear of dying. Some of these symptoms can mimic serious medical  conditions. For example, you may think you are having a heart attack. Although panic attacks can be very scary, they are not life threatening. DIAGNOSIS  Panic attacks are diagnosed through an assessment by your health care provider. Your health care provider will ask questions about your symptoms, such as where and when they occurred. Your health care provider will also ask about your medical history and use of alcohol and drugs, including prescription medicines. Your health care provider may order blood tests or other studies to rule out a serious medical condition. Your health care provider may refer you to a mental health professional for further evaluation. TREATMENT   Most healthy people who have one or two panic attacks in an extreme, life-threatening situation will not require treatment.  The treatment for panic attacks associated with anxiety disorders or other mental illness typically involves counseling with a mental health professional, medicine, or a combination of both. Your health care provider will help determine what treatment is best for you.  Panic attacks due to physical illness usually go away with treatment of the illness. If prescription medicine is causing panic attacks, talk with your health care provider about stopping the medicine, decreasing the dose, or substituting another medicine.  Panic attacks due to alcohol or drug abuse go away with abstinence. Some adults need professional help in order to stop drinking or using drugs. HOME CARE INSTRUCTIONS   Take all medicines as directed by your health care provider.   Schedule and attend follow-up visits as directed by your health care provider. It is important to keep all your appointments. SEEK MEDICAL CARE IF:  You are not able to take your medicines as prescribed.  Your symptoms do not improve or get worse. SEEK IMMEDIATE MEDICAL CARE IF:   You experience panic attack symptoms that are different than your usual  symptoms.  You have serious thoughts about hurting yourself or others.  You are taking medicine for panic attacks and have a serious side effect. MAKE SURE YOU:  Understand these instructions.  Will watch your condition.  Will get help right away if you are not doing well or get worse. Document Released: 10/24/2005 Document Revised: 10/29/2013 Document Reviewed: 06/07/2013 [facility] Patient Information 2015 [place], [place]. This information is not intended to replace advice given to you by your health care provider. Make sure you discuss any questions you have with your health care provider.

## 2014-12-13 NOTE — ED Notes (Signed)
Pt states she is having an anxiety attack. HR is extremely tachy.

## 2014-12-15 ENCOUNTER — Other Ambulatory Visit: Payer: Self-pay | Admitting: Family Medicine

## 2014-12-15 MED ORDER — LORAZEPAM 0.5 MG PO TABS: 0.5000 mg | ORAL_TABLET | Freq: Two times a day (BID) | ORAL | Status: DC | PRN

## 2014-12-15 MED ORDER — RANITIDINE HCL 150 MG PO TABS: 150.0000 mg | ORAL_TABLET | Freq: Two times a day (BID) | ORAL | Status: DC

## 2014-12-15 NOTE — Telephone Encounter (Signed)
Sure - ranitidine sent to pharmacy - we talked about this at the begining of her visit so I had forgotten. I was unaware that she needed a refill on her lorazepam at her visit.  She can pick up rx tonight.  What is the neurology clinic name or physician that she wants to see - then please place referral - for lightheadedness - I would encourage pt to ensure she has copies off All of the tests she had done to bring with her - I also want to make sure that pt knows that I doubt this will be significantly helpful to her but the more copies of tests and labs she can bring will be very helpful.
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
## 2014-12-15 NOTE — Telephone Encounter (Signed)
Referral to cardiologist, [place]. Fax number [phone]. Attn: Giovanna. Pt's phone [phone]. Referrals can we prioritize this one?

## 2014-12-15 NOTE — Addendum Note (Signed)
Addended by: [name] on: 12/15/2014 04:35 PM   Modules accepted: Orders

## 2014-12-15 NOTE — Telephone Encounter (Signed)
Pt called today inquiring about her prescriptions being sent to her pharmacy. She needs the Lorazepam and antacid sent to [facility] because she can receive them on the 4 dollar list. She also needs a referral sent to the neurologist in [place]. Their fax number is [phone]. She would also like her AVS from that day. I printed and put in the pick up draw. Please advise Dr. Brigitte Pulse.

## 2014-12-15 NOTE — Telephone Encounter (Signed)
Pt called in to inquire about referral for cardiologist and neurology also to check on a rx for lorazepam. Dr Brigitte Pulse said she printed it out earlier but staff was unable to locate rx so Dr Brigitte Pulse reprinted it. She has an appt with the cardiologist on Thursday in [place]. The neurologist she wants to go to is [facility] fax # [phone]. She needs the referrals for cardiologist and neurologist. Pt was advised that rx is ready and available for pick up. Pt's number is [phone]. Thank you

## 2014-12-16 ENCOUNTER — Telehealth: Payer: Self-pay

## 2014-12-16 NOTE — Telephone Encounter (Signed)
Referrals may need this information.

## 2014-12-16 NOTE — Telephone Encounter (Signed)
Yes, this is fine, no interaction.

## 2014-12-16 NOTE — Telephone Encounter (Signed)
Called patient and left her a message to call the office back and reschedule her apt. For 01-01-15 with Hoyle Sauer. Please put patient in apt. Slot with Marden Noble patient . Schedule change.

## 2014-12-16 NOTE — Telephone Encounter (Signed)
Patient also wanted to ask Dr Brigitte Pulse when taking her "Buspar" if it was ok for her to still take her "Lorazepam"..please advise cb# [phone]

## 2014-12-16 NOTE — Telephone Encounter (Signed)
I contacted patient to give her information on 2 referral she requested from Dr Brigitte Pulse to a Cardiologist/Neurologist in [place]. I let patient know our office has faxed over notes to [facility] attn Doren Custard [phone] (appt Thursday 12/18/14) and to Neurologists in [place] at [phone] (waiting on this office to contact patient with an appt). Patient stated she has spoken with [facility] and they received records but needed her MRI, Stress Test, and all her records/tests results from when she saw [facility] Cardiologist. She is requesting we fax this information to Mickeal Needy at [phone]. She stated Santiago Glad contacted our office for these records and we told her we were unable to send unless patient signed a release. If any questions in what to send call Santiago Glad @ [phone] [facility]). [facility] needs [facility] to be able to see patient on 12/18/14. Patients call number is [phone]
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
## 2014-12-16 NOTE — Telephone Encounter (Signed)
Please advise so I can inform pt.

## 2014-12-17 NOTE — Telephone Encounter (Signed)
Gave pt message.

## 2014-12-21 ENCOUNTER — Telehealth: Payer: Self-pay

## 2014-12-21 NOTE — Telephone Encounter (Signed)
Does she mean her panic attacks have increased since her buspar dose has increased?  I don't think we changed her ativan dose.  If this is what she means, then she should just go ahead and stop the buspar - can decrease by going down to bid x 3-4d, then qd x 3-4d, then stop.  If she is having more panic attacks since taking ativan more frequently, then she should reduce her usage.

## 2014-12-21 NOTE — Telephone Encounter (Signed)
The patient called to ask if her dosage of Ativan should be changed.  She said that her panic attacks have become more frequent since the last dosage increase.  Please advise.  Thank you.  CB#: [phone]

## 2014-12-23 NOTE — Telephone Encounter (Signed)
Left message for pt to call back  °

## 2014-12-23 NOTE — Telephone Encounter (Signed)
Patient says she increased her ativan to 1 pill per day as needed and also increased her buspar. I advised patient to take the buspar 2 times per day for 3-4 days and decrease to 1 time per day for 3-4 days then stop. Patient understood but wants to clarify the dosage for her ativan as to whether or not this needs to increase. (she says she knows that Dr Brigitte Pulse told her to increase)

## 2014-12-24 ENCOUNTER — Telehealth: Payer: Self-pay | Admitting: Family Medicine

## 2014-12-24 DIAGNOSIS — R0981 Nasal congestion: Secondary | ICD-10-CM

## 2014-12-24 NOTE — Telephone Encounter (Signed)
Yes, fine to refer due to sinus congestion.

## 2014-12-24 NOTE — Telephone Encounter (Signed)
Patient called and states that she needs a referral to an ENT specialist in [place]. Name of clinic is: [facility]. They need office notes faxed to them. Fax number is [phone]. Patient states that she is having a lot of sinus drainage and the fluid is pinkish in color. Please advise.  [phone]

## 2014-12-24 NOTE — Telephone Encounter (Signed)
Referral placed.

## 2014-12-24 NOTE — Telephone Encounter (Signed)
I called her, she states she felt bad yesterday. She states she has had symptoms of anxiety and is now concerned because she states her heart rate is 50 at night. She wants to know if she should d/c the Buspar, or if she should continue. ( I asked her what she wants to do, and she states she does not know, she seems very unsure of what to do) She states she has felt "weird " yesterday and better today. She feels a little light headed and "weird" She feels like she is breathing differently as well, but does not give any details, just different. She has only been at this dosage for one day, so I have encouraged her to continue the current dosage, since it will take more than one day to notice a difference of her symptoms, and the symptoms she mentions are very vague and non specific. I think she is looking for reassurance.  If you agree, then we will call patient back and offer reassurance she should continue the medications and give this more time. Please advise.   I have given her the numbers for Dr Toy Care and Dr Rip Harbour. She will think about making appt, currently she is out of town.

## 2014-12-24 NOTE — Telephone Encounter (Signed)
Perfect.  Since she thought her panic symptoms were getting worse after upping buspar, hopefully they will resolve or a least go back to baseline as she is weaning off of the buspar.  Pt can continue to take her lorazepam 0.5 mg bid prn - she had told me that she was often using a 1/2 tab only and not using frequently so encouraged pt to take a whole tab (=0.5mg ) - if this controls her symptoms, then we know all of her symptoms are due to anxiety and hopefully she can wean off of it or we can transition her to a non-sedating type of anxiety medication down the road.  I know she has an appt w/ a therapist but with all of this going on it would probably be an excellent idea to get an appt with a psychiatrist (MD - to rx meds) as well - she does not need a referral for this - lots of good ones in town - she can ask her therapist about it - but consider making an appt with either Dr. Toy Care [phone]  or Dr. Estrella Myrtle. [phone]

## 2014-12-25 NOTE — Telephone Encounter (Signed)
Wonderful - thank you so much, i agree that if she has only been on the higher dose for 1 day that this is unlikely to be a problem and she should only improve as she stays on this dose.  50 heart rate at night is fine.

## 2014-12-30 ENCOUNTER — Telehealth: Payer: Self-pay

## 2014-12-30 DIAGNOSIS — J342 Deviated nasal septum: Secondary | ICD-10-CM | POA: Insufficient documentation

## 2014-12-30 DIAGNOSIS — J320 Chronic maxillary sinusitis: Secondary | ICD-10-CM | POA: Insufficient documentation

## 2014-12-30 DIAGNOSIS — J310 Chronic rhinitis: Secondary | ICD-10-CM | POA: Insufficient documentation

## 2014-12-30 NOTE — Telephone Encounter (Signed)
[facility] in [place] requested records on the voice mail for this patient.  Contacted them an asked them to send the signed medical release form.  They will fax it to [place].  Their number is [phone]

## 2014-12-31 ENCOUNTER — Ambulatory Visit: Payer: 59 | Admitting: Nurse Practitioner

## 2015-01-01 ENCOUNTER — Ambulatory Visit: Payer: 59 | Admitting: Nurse Practitioner

## 2015-01-01 LAB — CT SINUS LTD W/O CM

## 2015-01-01 NOTE — Telephone Encounter (Signed)
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  Recommended restarting on propranolol/atenolol in future if sxs cont but advised no acei/arb due to possible preconception thoughts. Advised pt to make an appt w/ psych - Dr. Toy Care or Dr. Caprice Beaver recommended. Reviewed that she may want to wean off of buspar in future - she absolutely couldn't tolerate it qid and has been on tid for a while w/o benefit - however, will not want to peel it back/off when she is coming back to  as I am concerned that her returning back here ([place] scene of [place] event and to living along again) will trigger her sxs.  However, the eventual plan will be to back off buspar and try pt on either inderal and/or citalopram.  Cont ativan - she has not had an episode in almost 10d!

## 2015-01-06 ENCOUNTER — Telehealth: Payer: Self-pay

## 2015-01-06 NOTE — Telephone Encounter (Signed)
Pt says her BP is running high.  The bottom number is over 100.  She says during the day she'll not start feeling well and that is when she takes the BP.  Could this be because of her anxiety?  Says she took a lorazapam and she felt better, but she did not retake her BP.  Says she will do that tonight.  [phone]

## 2015-01-09 ENCOUNTER — Encounter: Payer: Self-pay | Admitting: Family Medicine

## 2015-01-09 ENCOUNTER — Other Ambulatory Visit: Payer: 59

## 2015-01-11 NOTE — Telephone Encounter (Signed)
Yes, anxiety can definitely increase BP - but as pt did have HTN prior to the panic/anxiety diagnosis unsure if elevated diastolic is completely due to that.  Completely fine to take lorazepam and recheck BP in 60 min - if Bp back to normal than due to anxiety. If BP is still elevated than maybe her prior HTN is worsening again in which case we should start pt back on a BP med that limits palpitations (shuch as propranolol).

## 2015-01-12 ENCOUNTER — Telehealth: Payer: Self-pay

## 2015-01-12 NOTE — Telephone Encounter (Signed)
Pt would like Dr. Brigitte Pulse to check her Mychart message from her and give a call back. Please advise at [phone]

## 2015-01-12 NOTE — Telephone Encounter (Signed)
I will when I have some downtime - however am in clinic tonight and tomorrow so may be a couple of days.  If she would like to come in or leave a message w/ the TL that may be more timely.

## 2015-01-12 NOTE — Telephone Encounter (Signed)
Spoke with pt, advised message from Dr. Brigitte Pulse. Pt understood.

## 2015-01-14 NOTE — Telephone Encounter (Signed)
Reviewed and replied yest.

## 2015-01-16 ENCOUNTER — Telehealth: Payer: Self-pay

## 2015-01-16 NOTE — Telephone Encounter (Signed)
RE: Non-Urgent Medical Question  Message [phone]   From  Brooke Turner   To  [name], MD   Sent  01/15/2015 11:01 AM     I mentioned to the cardiologist about the dosage amount & they decreased it from 25mg  twice a day to half, 12.5 mg twice a day. The Dr. said he started me on that because my heart can handle it but like the pharmacist stated my heart may be able to handle it but the rest of my body can't right away. I'm still in [place] & nervous about going back, not sure why since I was able to drive up(breaking up the trip though), but I guess due to trying to get used to this new medication & still having panic/anxiety symptoms like feeling spaced out, lightheadedness, chest pains & internal tremors/shakiness.... I may have someone drive back with me. I can't sit more than maybe 20 min right now without feeling drowsy & I fall asleep. I want to take Lorazepam but I know it lowers bp & heart rate also like the Carvedilol so is it ok to take with it? The cardiologist nurse said yes. [employer] who handles my disability sent over papers for an extension out of work until 4/4. Thank you!       I'm not sure if you saw this Dr. Brigitte Pulse.

## 2015-01-16 NOTE — Telephone Encounter (Signed)
Pt wants Dr. Brigitte Pulse to read her My Chart and respond to her. She also wants a refill on busPIRone (BUSPAR) 5 MG tablet [[id number]]. Please advise at [phone]

## 2015-01-18 ENCOUNTER — Other Ambulatory Visit: Payer: Self-pay

## 2015-01-18 NOTE — Telephone Encounter (Signed)
Patient called to request a refill on Zantac 150 MG. Patient states she is in [place]. She want her prescription called in at [facility] on [address]

## 2015-01-19 MED ORDER — RANITIDINE HCL 150 MG PO TABS: 150.0000 mg | ORAL_TABLET | Freq: Two times a day (BID) | ORAL | Status: DC

## 2015-01-19 MED ORDER — BUSPIRONE HCL 5 MG PO TABS: ORAL_TABLET | ORAL | Status: DC

## 2015-01-19 NOTE — Telephone Encounter (Signed)
Sent Buspar RF in and notified pt. D/W her Dr Raul Del message and inst's below. Pt verb understanding.

## 2015-01-19 NOTE — Telephone Encounter (Signed)
Fine to refill the buspar.  I already replied to her about the carvedilol - she has to many doctors giving to many different opinions - if she is still having all those symptoms on the medication that the prescribed her (carvedilol) she either needs to continue to contact them again or stop it.  Do not drive on lorazepam. No chemical interactions/restrictions between carvedilol and lorazepam - fine to take together - but both may make fatigue and depression worse.

## 2015-01-19 NOTE — Telephone Encounter (Signed)
Dr Brigitte Pulse, it looks like you just Rxd zantac for pt for 1 mos. OK to give RFs?

## 2015-01-21 NOTE — Telephone Encounter (Signed)
Error

## 2015-01-21 NOTE — Telephone Encounter (Signed)
error 

## 2015-01-27 ENCOUNTER — Telehealth: Payer: Self-pay

## 2015-01-27 ENCOUNTER — Telehealth: Payer: Self-pay | Admitting: Cardiology

## 2015-01-27 NOTE — Telephone Encounter (Signed)
Pt woke up last night with burning in her chest. She took 2 tums and went back to sleep. She would like to know if she should switch from ranitidine (ZANTAC) 150 MG tablet [[id number]] to nexium? She would also like to know if she should see a cardiologist? Please advise at [phone]

## 2015-01-27 NOTE — Telephone Encounter (Signed)
No, i've not seen anything in my box.  I think Brooke Turner might have mentioned that she received a form and was able to complete it from her prior forms - AMAZING!!!!! I will keep an eye out for anything

## 2015-01-27 NOTE — Telephone Encounter (Signed)
Dr. Brigitte Pulse, have you come across any FMLA for this patient? I had a Vm left on my machine 3/22 asking if it had been completed, but I have not received anything on her recently. I attempted to contact patient with no success.

## 2015-01-27 NOTE — Telephone Encounter (Signed)
Please call,concerned about chest pains she had last night. Please call her asap.

## 2015-01-27 NOTE — Telephone Encounter (Signed)
Returned call to patient she stated woke up last night with chest burning.Stated she has never had burning this bad before.Advised to see PCP.Advised if PCP feels she needs appointment to call back.

## 2015-01-27 NOTE — Telephone Encounter (Signed)
No need to see cardiology - I feel that her heart has been so thoroughly evaluated twice that I feel 100% sure that this is from heartburn and not cardiac.  However, if pt wants a second opinion she can contact them.  Yes, she should restart on the nexium for 6 weeks.  It can take a week to fully kick in so overlap with the zantac for a week.

## 2015-01-27 NOTE — Telephone Encounter (Signed)
Dr Shaw please advise. 

## 2015-01-28 NOTE — Telephone Encounter (Signed)
Dr. Brigitte Pulse, pt would like to extend her leave until April 11th. She states when she returns from [place] she still has to see a couple Recruitment consultant. Can we change the date. I will try to locate paperwork and leave in your box. Thanks.

## 2015-01-28 NOTE — Telephone Encounter (Signed)
Spoke with pt, advised message from Dr. Brigitte Pulse. Pt understood.

## 2015-01-28 NOTE — Telephone Encounter (Signed)
Received patient paperwork, filled out based on previous form, just needs Dr. Durene Cal. Will place in Dr.'s box

## 2015-01-29 ENCOUNTER — Ambulatory Visit: Payer: 59 | Admitting: Neurology

## 2015-01-30 ENCOUNTER — Encounter: Payer: Self-pay | Admitting: Neurology

## 2015-01-30 NOTE — Telephone Encounter (Signed)
Yeah, that's fine. Will sign thanks.

## 2015-02-02 NOTE — Telephone Encounter (Signed)
Dr. Brigitte Pulse not in today, found ppwk unsigned in her box. Dr. Brigitte Pulse approved me to sign on her behalf and I advised Breella that I would be doing so and faxed [employer] this morning. Ok per patient.

## 2015-02-02 NOTE — Telephone Encounter (Signed)
Pt called because her employer has not received the ppwk regarding her medical leave which seems to have been signed by Dr. Brigitte Pulse based on previous messages. Can we please fax this to [phone] asap? Today is the last day that Christella Scheuermann will approve this. Pt is requesting a phonecall once this is completed.

## 2015-02-09 NOTE — Telephone Encounter (Signed)
URGENT - Extend the date to April 18th.  Part time for three more weeks.  [phone]

## 2015-02-13 ENCOUNTER — Telehealth: Payer: Self-pay

## 2015-02-13 NOTE — Telephone Encounter (Signed)
Patient's work extension got denied by the insurance and would like to speak to Dr. Brigitte Pulse. She states that it was supposed to be approved until the 11th and there's been a lot back and forth and she waiting to hear from her job if she's getting fired or not. She would like an extension until the 18th with restrictions. [phone]

## 2015-02-17 NOTE — Telephone Encounter (Signed)
Yes, that is fine.    Brooke Turner and Brooke Turner have both been wonderful with helping to provide repeated exemptions and updates - the fmla/disabilities team has been very patient and responsive - there is no way I would have been able to get this done for Brooke Turner on my own.   Brooke Turner has not been in for a visit for several months as some of that time she has been out of state. But as there have been so many changes I really need her to come back in for an OV within the next week or two to review medications and work ability going forward.

## 2015-02-17 NOTE — Telephone Encounter (Signed)
Pt has been leaving multiple messages a day on the disability VM in regards to her paperwork; that [facility] and myself have completed/sent on 3/29, 3/17, 2/29, 2/17, and 2/08 based on Ov notes and previous FMLA completed. I spoke wither her again today, and it appears that she is requesting an additional extension. Pt is aware of our FMLA policies, and that we always ask for an allowance of 5-7 business days, but she states, " Absolutely not, this must be done today." I advised Patient that if she sends the paperwork to me with the changes she is requesting, that I would look into her chart, and if I find anything indicating that this has been approved by Dr. Brigitte Pulse, or can receive an approval from Dr. Brigitte Pulse, I would send the paperwork back to [facility] with the amendments made, but I could not promise a turn around time of today. I recently extended her last form to 4/11, she is now requesting that the extension be made out till 4/18 with restrictions of light duty. Dr. Brigitte Pulse, would you like me to make these changes to her forms?

## 2015-02-18 NOTE — Telephone Encounter (Signed)
Thank you Dr. Brigitte Pulse!   I have faxed Amended paperwork to [facility], and will call and notify patient.

## 2015-02-19 ENCOUNTER — Telehealth: Payer: Self-pay

## 2015-02-19 NOTE — Telephone Encounter (Signed)
Patient would like to leave a message for Dr. Brigitte Pulse to check her MyChart message

## 2015-02-20 ENCOUNTER — Encounter: Payer: Self-pay | Admitting: Family Medicine

## 2015-02-20 NOTE — Telephone Encounter (Signed)
fyi dr Brigitte Pulse

## 2015-02-20 NOTE — Telephone Encounter (Signed)
Pt sent in 4 mychart emails this morning detailing many concerns and complaints - largest among them is that her disability paperwork got denied - apparently it was not specific enough as to her wide range of symptoms.  Because of this she is facing loss of backpayment on her health ins maybe?  I'm not really sure. . . .   I guess is we can find a copy of the last info we sent to [facility] - and put it in my box - I will come into the office on Monday to addend it and resubmit to her insurance.  Pt also stated that she is planning to come into clinic on Monday - which is necessary. She has seen quite a few specialists in [place] and have asked them to send up copies - I'm not sure if I have gotten any of these, but we certainly don't have any up to date problem list, medications, labs and as she has not seen [place] in so long this poor continuity of information is likely contributing to the rejection of her disability claim.

## 2015-02-21 NOTE — Telephone Encounter (Signed)
Dr. Brigitte Pulse responded to pt's email Can we get the disability paperwork that she is talking about for her? Thanks

## 2015-02-24 ENCOUNTER — Telehealth: Payer: Self-pay | Admitting: Family Medicine

## 2015-02-24 ENCOUNTER — Ambulatory Visit (INDEPENDENT_AMBULATORY_CARE_PROVIDER_SITE_OTHER): Payer: 59 | Admitting: Family Medicine

## 2015-02-24 VITALS — BP 122/74 | HR 65 | Temp 98.2°F | Resp 16 | Ht 63.5 in | Wt 201.6 lb

## 2015-02-24 DIAGNOSIS — F410 Panic disorder [episodic paroxysmal anxiety] without agoraphobia: Secondary | ICD-10-CM

## 2015-02-24 DIAGNOSIS — R0609 Other forms of dyspnea: Secondary | ICD-10-CM

## 2015-02-24 DIAGNOSIS — F329 Major depressive disorder, single episode, unspecified: Secondary | ICD-10-CM | POA: Diagnosis not present

## 2015-02-24 DIAGNOSIS — F418 Other specified anxiety disorders: Secondary | ICD-10-CM | POA: Diagnosis not present

## 2015-02-24 DIAGNOSIS — F4321 Adjustment disorder with depressed mood: Secondary | ICD-10-CM

## 2015-02-24 DIAGNOSIS — F32A Depression, unspecified: Secondary | ICD-10-CM

## 2015-02-24 DIAGNOSIS — R0600 Dyspnea, unspecified: Secondary | ICD-10-CM

## 2015-02-24 DIAGNOSIS — R5383 Other fatigue: Secondary | ICD-10-CM | POA: Diagnosis not present

## 2015-02-24 DIAGNOSIS — D252 Subserosal leiomyoma of uterus: Secondary | ICD-10-CM | POA: Diagnosis not present

## 2015-02-24 DIAGNOSIS — I10 Essential (primary) hypertension: Secondary | ICD-10-CM | POA: Diagnosis not present

## 2015-02-24 DIAGNOSIS — R030 Elevated blood-pressure reading, without diagnosis of hypertension: Secondary | ICD-10-CM | POA: Diagnosis not present

## 2015-02-24 DIAGNOSIS — F064 Anxiety disorder due to known physiological condition: Secondary | ICD-10-CM

## 2015-02-24 DIAGNOSIS — F4310 Post-traumatic stress disorder, unspecified: Secondary | ICD-10-CM | POA: Diagnosis not present

## 2015-02-24 DIAGNOSIS — N920 Excessive and frequent menstruation with regular cycle: Secondary | ICD-10-CM

## 2015-02-24 DIAGNOSIS — Z3009 Encounter for other general counseling and advice on contraception: Secondary | ICD-10-CM | POA: Diagnosis not present

## 2015-02-24 DIAGNOSIS — R0989 Other specified symptoms and signs involving the circulatory and respiratory systems: Secondary | ICD-10-CM

## 2015-02-24 MED ORDER — CARVEDILOL 6.25 MG PO TABS: 6.2500 mg | ORAL_TABLET | Freq: Two times a day (BID) | ORAL | Status: DC
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
## 2015-02-24 NOTE — Telephone Encounter (Signed)
[employer] faxed a behavioral health form to be completed by Dr. Brigitte Pulse. A blank copy has been scanned to patient's chart and paperwork has be placed in Dr. Raul Del box on 02/24/2015. Please return to FMLA/Disability department upon completion.

## 2015-02-24 NOTE — Patient Instructions (Addendum)
Start a multivitamin with vitamin B complex supplement, consider starting a hair vitamin like biotin, it is ok to have vitamin D and make sure you are getting in calcium and fish oil.   Tellegium effluvium is likely the cause of your hair loss and your hair should grow back Iron-Rich Diet An iron-rich diet contains foods that are good sources of iron. Iron is an important mineral that helps your body produce hemoglobin. Hemoglobin is a protein in red blood cells that carries oxygen to the body's tissues. Sometimes, the iron level in your blood can be low. This may be caused by:  A lack of iron in your diet.  Blood loss.  Times of growth, such as during pregnancy or during a child's growth and development. Low levels of iron can cause a decrease in the number of red blood cells. This can result in iron deficiency anemia. Iron deficiency anemia symptoms include:  Tiredness.  Weakness.  Irritability.  Increased chance of infection. Here are some recommendations for daily iron intake:  Males older than 40 years of age need 8 mg of iron per day.  Women ages 66 to 50 need 18 mg of iron per day.  Pregnant women need 27 mg of iron per day, and women who are over 52 years of age and breastfeeding need 9 mg of iron per day.  Women over the age of 53 need 8 mg of iron per day. SOURCES OF IRON There are 2 types of iron that are found in food: heme iron and nonheme iron. Heme iron is absorbed by the body better than nonheme iron. Heme iron is found in meat, poultry, and fish. Nonheme iron is found in grains, beans, and vegetables. Heme Iron Sources Food / Iron (mg)  Chicken liver, 3 oz (85 g)/ 10 mg  Beef liver, 3 oz (85 g)/ 5.5 mg  Oysters, 3 oz (85 g)/ 8 mg  Beef, 3 oz (85 g)/ 2 to 3 mg  Shrimp, 3 oz (85 g)/ 2.8 mg  [place], 3 oz (85 g)/ 2 mg  Chicken, 3 oz (85 g) / 1 mg  Fish (tuna, halibut), 3 oz (85 g)/ 1 mg  Pork, 3 oz (85 g)/ 0.9 mg Nonheme Iron Sources Food / Iron  (mg)  Ready-to-eat breakfast cereal, iron-fortified / 3.9 to 7 mg  Tofu,  cup / 3.4 mg  Kidney beans,  cup / 2.6 mg  Baked potato with skin / 2.7 mg  Asparagus,  cup / 2.2 mg  Avocado / 2 mg  Dried peaches,  cup / 1.6 mg  Raisins,  cup / 1.5 mg  Soy milk, 1 cup / 1.5 mg  Whole-wheat bread, 1 slice / 1.2 mg  Spinach, 1 cup / 0.8 mg  Broccoli,  cup / 0.6 mg IRON ABSORPTION Certain foods can decrease the body's absorption of iron. Try to avoid these foods and beverages while eating meals with iron-containing foods:  Coffee.  Tea.  Fiber.  Soy. Foods containing vitamin C can help increase the amount of iron your body absorbs from iron sources, especially from nonheme sources. Eat foods with vitamin C along with iron-containing foods to increase your iron absorption. Foods that are high in vitamin C include many fruits and vegetables. Some good sources are:  Fresh orange juice.  Oranges.  Strawberries.  Mangoes.  Grapefruit.  Red bell peppers.  Green bell peppers.  Broccoli.  Potatoes with skin.  Tomato juice. Document Released: 06/07/2005 Document Revised: 01/16/2012 Document Reviewed: 04/14/2011  [employer] Patient Information 2015 [employer]. This information is not intended to replace advice given to you by your health care provider. Make sure you discuss any questions you have with your health care provider.  

## 2015-02-24 NOTE — Telephone Encounter (Signed)
Pt seen in office today and form reviewed in detail - it is extensive.  Will attempt to complete tomorrow and pt will return to clinic tomorrow to see me to review and ensure have been completed in entirity.

## 2015-02-25 ENCOUNTER — Ambulatory Visit (INDEPENDENT_AMBULATORY_CARE_PROVIDER_SITE_OTHER): Payer: 59 | Admitting: Family Medicine

## 2015-02-25 VITALS — BP 118/78 | HR 70 | Temp 98.3°F | Resp 18 | Ht 63.5 in | Wt 199.2 lb

## 2015-02-25 DIAGNOSIS — M6249 Contracture of muscle, multiple sites: Secondary | ICD-10-CM | POA: Diagnosis not present

## 2015-02-25 DIAGNOSIS — F410 Panic disorder [episodic paroxysmal anxiety] without agoraphobia: Secondary | ICD-10-CM

## 2015-02-25 DIAGNOSIS — R4589 Other symptoms and signs involving emotional state: Secondary | ICD-10-CM | POA: Insufficient documentation

## 2015-02-25 DIAGNOSIS — D252 Subserosal leiomyoma of uterus: Secondary | ICD-10-CM | POA: Insufficient documentation

## 2015-02-25 DIAGNOSIS — F4310 Post-traumatic stress disorder, unspecified: Secondary | ICD-10-CM | POA: Diagnosis not present

## 2015-02-25 DIAGNOSIS — D259 Leiomyoma of uterus, unspecified: Secondary | ICD-10-CM | POA: Insufficient documentation

## 2015-02-25 DIAGNOSIS — F418 Other specified anxiety disorders: Secondary | ICD-10-CM | POA: Insufficient documentation

## 2015-02-25 DIAGNOSIS — F064 Anxiety disorder due to known physiological condition: Secondary | ICD-10-CM

## 2015-02-25 DIAGNOSIS — I10 Essential (primary) hypertension: Secondary | ICD-10-CM

## 2015-02-25 DIAGNOSIS — S134XXD Sprain of ligaments of cervical spine, subsequent encounter: Secondary | ICD-10-CM | POA: Diagnosis not present

## 2015-02-25 DIAGNOSIS — M62838 Other muscle spasm: Secondary | ICD-10-CM

## 2015-02-25 NOTE — Patient Instructions (Signed)
Start taking the cyclobenzaprine 10mg  every night.  Continue applying heat to your neck every night for at least 20 minutes followed by gentle stretching - do this as many times during the day as you can. If you are continuing to have pain in another week than we would want to start you on meloxicam in the morning.  I will also want you to start citalopram 5mg  daily at our next visit - which we will leave you on for 6 months and then stop. Please call  Brooke Turner to make an appointment for his first available - He sees pts M-F 8:30 pm. - 7:00 p.m. Sat 9:30-2 at [place] - phone # is [phone]   Cervical Strain and Sprain (Whiplash) with Rehab Cervical strain and sprain are injuries that commonly occur with "whiplash" injuries. Whiplash occurs when the neck is forcefully whipped backward or forward, such as during a motor vehicle accident or during contact sports. The muscles, ligaments, tendons, discs, and nerves of the neck are susceptible to injury when this occurs. RISK FACTORS Risk of having a whiplash injury increases if:  Osteoarthritis of the spine.  Situations that make head or neck accidents or trauma more likely.  High-risk sports (football, rugby, wrestling, hockey, auto racing, gymnastics, diving, contact karate, or boxing).  Poor strength and flexibility of the neck.  Previous neck injury.  Poor tackling technique.  Improperly fitted or padded equipment. SYMPTOMS   Pain or stiffness in the front or back of neck or both.  Symptoms may present immediately or up to 24 hours after injury.  Dizziness, headache, nausea, and vomiting.  Muscle spasm with soreness and stiffness in the neck.  Tenderness and swelling at the injury site. PREVENTION  Learn and use proper technique (avoid tackling with the head, spearing, and head-butting; use proper falling techniques to avoid landing on the head).  Warm up and stretch properly before activity.  Maintain physical  fitness:  Strength, flexibility, and endurance.  Cardiovascular fitness.  Wear properly fitted and padded protective equipment, such as padded soft collars, for participation in contact sports. PROGNOSIS  Recovery from cervical strain and sprain injuries is dependent on the extent of the injury. These injuries are usually curable in 1 week to 3 months with appropriate treatment.  RELATED COMPLICATIONS   Temporary numbness and weakness may occur if the nerve roots are damaged, and this may persist until the nerve has completely healed.  Chronic pain due to frequent recurrence of symptoms.  Prolonged healing, especially if activity is resumed too soon (before complete recovery). TREATMENT  Treatment initially involves the use of ice and medication to help reduce pain and inflammation. It is also important to perform strengthening and stretching exercises and modify activities that worsen symptoms so the injury does not get worse. These exercises may be performed at home or with a therapist. For patients who experience severe symptoms, a soft, padded collar may be recommended to be worn around the neck.  Improving your posture may help reduce symptoms. Posture improvement includes pulling your chin and abdomen in while sitting or standing. If you are sitting, sit in a firm chair with your buttocks against the back of the chair. While sleeping, try replacing your pillow with a small towel rolled to 2 inches in diameter, or use a cervical pillow or soft cervical collar. Poor sleeping positions delay healing.  For patients with nerve root damage, which causes numbness or weakness, the use of a cervical traction apparatus may  be recommended. Surgery is rarely necessary for these injuries. However, cervical strain and sprains that are present at birth (congenital) may require surgery. MEDICATION   If pain medication is necessary, nonsteroidal anti-inflammatory medications, such as aspirin and  ibuprofen, or other minor pain relievers, such as acetaminophen, are often recommended.  Do not take pain medication for 7 days before surgery.  Prescription pain relievers may be given if deemed necessary by your caregiver. Use only as directed and only as much as you need. HEAT AND COLD:   Cold treatment (icing) relieves pain and reduces inflammation. Cold treatment should be applied for 10 to 15 minutes every 2 to 3 hours for inflammation and pain and immediately after any activity that aggravates your symptoms. Use ice packs or an ice massage.  Heat treatment may be used prior to performing the stretching and strengthening activities prescribed by your caregiver, physical therapist, or athletic trainer. Use a heat pack or a warm soak. SEEK MEDICAL CARE IF:   Symptoms get worse or do not improve in 2 weeks despite treatment.  New, unexplained symptoms develop (drugs used in treatment may produce side effects). EXERCISES RANGE OF MOTION (ROM) AND STRETCHING EXERCISES - Cervical Strain and Sprain These exercises may help you when beginning to rehabilitate your injury. In order to successfully resolve your symptoms, you must improve your posture. These exercises are designed to help reduce the forward-head and rounded-shoulder posture which contributes to this condition. Your symptoms may resolve with or without further involvement from your physician, physical therapist or athletic trainer. While completing these exercises, remember:   Restoring tissue flexibility helps normal motion to return to the joints. This allows healthier, less painful movement and activity.  An effective stretch should be held for at least 20 seconds, although you may need to begin with shorter hold times for comfort.  A stretch should never be painful. You should only feel a gentle lengthening or release in the stretched tissue. STRETCH- Axial Extensors  Lie on your back on the floor. You may bend your knees for  comfort. Place a rolled-up hand towel or dish towel, about 2 inches in diameter, under the part of your head that makes contact with the floor.  Gently tuck your chin, as if trying to make a "double chin," until you feel a gentle stretch at the base of your head.  Hold __________ seconds. Repeat __________ times. Complete this exercise __________ times per day.  STRETCH - Axial Extension   Stand or sit on a firm surface. Assume a good posture: chest up, shoulders drawn back, abdominal muscles slightly tense, knees unlocked (if standing) and feet hip width apart.  Slowly retract your chin so your head slides back and your chin slightly lowers. Continue to look straight ahead.  You should feel a gentle stretch in the back of your head. Be certain not to feel an aggressive stretch since this can cause headaches later.  Hold for __________ seconds. Repeat __________ times. Complete this exercise __________ times per day. STRETCH - Cervical Side Bend   Stand or sit on a firm surface. Assume a good posture: chest up, shoulders drawn back, abdominal muscles slightly tense, knees unlocked (if standing) and feet hip width apart.  Without letting your nose or shoulders move, slowly tip your right / left ear to your shoulder until your feel a gentle stretch in the muscles on the opposite side of your neck.  Hold __________ seconds. Repeat __________ times. Complete this exercise __________ times per  day. STRETCH - Cervical Rotators   Stand or sit on a firm surface. Assume a good posture: chest up, shoulders drawn back, abdominal muscles slightly tense, knees unlocked (if standing) and feet hip width apart.  Keeping your eyes level with the ground, slowly turn your head until you feel a gentle stretch along the back and opposite side of your neck.  Hold __________ seconds. Repeat __________ times. Complete this exercise __________ times per day. RANGE OF MOTION - Neck Circles   Stand or sit on  a firm surface. Assume a good posture: chest up, shoulders drawn back, abdominal muscles slightly tense, knees unlocked (if standing) and feet hip width apart.  Gently roll your head down and around from the back of one shoulder to the back of the other. The motion should never be forced or painful.  Repeat the motion 10-20 times, or until you feel the neck muscles relax and loosen. Repeat __________ times. Complete the exercise __________ times per day. STRENGTHENING EXERCISES - Cervical Strain and Sprain These exercises may help you when beginning to rehabilitate your injury. They may resolve your symptoms with or without further involvement from your physician, physical therapist, or athletic trainer. While completing these exercises, remember:   Muscles can gain both the endurance and the strength needed for everyday activities through controlled exercises.  Complete these exercises as instructed by your physician, physical therapist, or athletic trainer. Progress the resistance and repetitions only as guided.  You may experience muscle soreness or fatigue, but the pain or discomfort you are trying to eliminate should never worsen during these exercises. If this pain does worsen, stop and make certain you are following the directions exactly. If the pain is still present after adjustments, discontinue the exercise until you can discuss the trouble with your clinician. STRENGTH - Cervical Flexors, Isometric  Face a wall, standing about 6 inches away. Place a small pillow, a ball about 6-8 inches in diameter, or a folded towel between your forehead and the wall.  Slightly tuck your chin and gently push your forehead into the soft object. Push only with mild to moderate intensity, building up tension gradually. Keep your jaw and forehead relaxed.  Hold 10 to 20 seconds. Keep your breathing relaxed.  Release the tension slowly. Relax your neck muscles completely before you start the next  repetition. Repeat __________ times. Complete this exercise __________ times per day. STRENGTH- Cervical Lateral Flexors, Isometric   Stand about 6 inches away from a wall. Place a small pillow, a ball about 6-8 inches in diameter, or a folded towel between the side of your head and the wall.  Slightly tuck your chin and gently tilt your head into the soft object. Push only with mild to moderate intensity, building up tension gradually. Keep your jaw and forehead relaxed.  Hold 10 to 20 seconds. Keep your breathing relaxed.  Release the tension slowly. Relax your neck muscles completely before you start the next repetition. Repeat __________ times. Complete this exercise __________ times per day. STRENGTH - Cervical Extensors, Isometric   Stand about 6 inches away from a wall. Place a small pillow, a ball about 6-8 inches in diameter, or a folded towel between the back of your head and the wall.  Slightly tuck your chin and gently tilt your head back into the soft object. Push only with mild to moderate intensity, building up tension gradually. Keep your jaw and forehead relaxed.  Hold 10 to 20 seconds. Keep your breathing relaxed.  Release the tension slowly. Relax your neck muscles completely before you start the next repetition. Repeat __________ times. Complete this exercise __________ times per day. POSTURE AND BODY MECHANICS CONSIDERATIONS - Cervical Strain and Sprain Keeping correct posture when sitting, standing or completing your activities will reduce the stress put on different body tissues, allowing injured tissues a chance to heal and limiting painful experiences. The following are general guidelines for improved posture. Your physician or physical therapist will provide you with any instructions specific to your needs. While reading these guidelines, remember:  The exercises prescribed by your provider will help you have the flexibility and strength to maintain correct  postures.  The correct posture provides the optimal environment for your joints to work. All of your joints have less wear and tear when properly supported by a spine with good posture. This means you will experience a healthier, less painful body.  Correct posture must be practiced with all of your activities, especially prolonged sitting and standing. Correct posture is as important when doing repetitive low-stress activities (typing) as it is when doing a single heavy-load activity (lifting). PROLONGED STANDING WHILE SLIGHTLY LEANING FORWARD When completing a task that requires you to lean forward while standing in one place for a long time, place either foot up on a stationary 2- to 4-inch high object to help maintain the best posture. When both feet are on the ground, the low back tends to lose its slight inward curve. If this curve flattens (or becomes too large), then the back and your other joints will experience too much stress, fatigue more quickly, and can cause pain.  RESTING POSITIONS Consider which positions are most painful for you when choosing a resting position. If you have pain with flexion-based activities (sitting, bending, stooping, squatting), choose a position that allows you to rest in a less flexed posture. You would want to avoid curling into a fetal position on your side. If your pain worsens with extension-based activities (prolonged standing, working overhead), avoid resting in an extended position such as sleeping on your stomach. Most people will find more comfort when they rest with their spine in a more neutral position, neither too rounded nor too arched. Lying on a non-sagging bed on your side with a pillow between your knees, or on your back with a pillow under your knees will often provide some relief. Keep in mind, being in any one position for a prolonged period of time, no matter how correct your posture, can still lead to stiffness. WALKING Walk with an upright  posture. Your ears, shoulders, and hips should all line up. OFFICE WORK When working at a desk, create an environment that supports good, upright posture. Without extra support, muscles fatigue and lead to excessive strain on joints and other tissues. CHAIR:  A chair should be able to slide under your desk when your back makes contact with the back of the chair. This allows you to work closely.  The chair's height should allow your eyes to be level with the upper part of your monitor and your hands to be slightly lower than your elbows.  Body position:  Your feet should make contact with the floor. If this is not possible, use a foot rest.  Keep your ears over your shoulders. This will reduce stress on your neck and low back. Document Released: 10/24/2005 Document Revised: 03/10/2014 Document Reviewed: 02/05/2009 [facility] Patient Information 2015 [place], [place]. This information is not intended to replace advice given to you by your  health care provider. Make sure you discuss any questions you have with your health care provider.
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
## 2015-02-26 ENCOUNTER — Telehealth: Payer: Self-pay

## 2015-02-26 NOTE — Telephone Encounter (Signed)
Patient says Dr Brigitte Pulse put the wrong return to work date on her form. She says she needs it corrected ASAP

## 2015-02-26 NOTE — Telephone Encounter (Signed)
Brooke Turner was seen in my clinic on 02/25/2015. She may return to work on Thursday, February 26, 2015, working part time for 4 hours per day up until Friday, Mar 20, 2015 at which point she may return to work full-time.    What should her return to work date be? I can fix letter just let me know.

## 2015-02-26 NOTE — Telephone Encounter (Signed)
Pt brought it back today and it was fixed.

## 2015-02-28 ENCOUNTER — Telehealth: Payer: Self-pay

## 2015-02-28 NOTE — Telephone Encounter (Signed)
Pt has papers from dr Jodie Echevaria of [facility] faxed to dr Brigitte Pulse and she would like for her to review them and give her a call she has concerns about results

## 2015-03-02 MED ORDER — CITALOPRAM HYDROBROMIDE 10 MG PO TABS: 5.0000 mg | ORAL_TABLET | Freq: Every day | ORAL | Status: DC

## 2015-03-03 ENCOUNTER — Telehealth: Payer: Self-pay | Admitting: *Deleted

## 2015-03-03 ENCOUNTER — Telehealth: Payer: Self-pay

## 2015-03-03 MED ORDER — PROPRANOLOL HCL 20 MG PO TABS: 20.0000 mg | ORAL_TABLET | Freq: Four times a day (QID) | ORAL | Status: DC | PRN

## 2015-03-03 NOTE — Telephone Encounter (Signed)
Medication for Propranolol 20 mg was called into [facility] on [address] in [place], [place] Per Dr. Brigitte Pulse

## 2015-03-03 NOTE — Telephone Encounter (Signed)
Forgot since i talked w/ pt last night and wasn't on the chart - the citalopram must have been sent over wks-mos before - jasmine called in propranolol 20mg  qid prn anxiety/palpitations #120 no refills on my behalf and informed pt.

## 2015-03-03 NOTE — Telephone Encounter (Signed)
Pharm called from [facility] where pt is waiting. Pt is stating that she wanted Dr Brigitte Pulse to send in propanolol, not citalopram. Dr Brigitte Pulse, please advise.

## 2015-03-05 ENCOUNTER — Telehealth: Payer: Self-pay | Admitting: Family Medicine

## 2015-03-05 NOTE — Telephone Encounter (Signed)
Patient called checking the status of paperwork to be sent to [facility]. Patient states that Dr. Brigitte Pulse completed forms during her last OV. Patient states that Christella Scheuermann has not yet received the papers. There is no copy scanned. Were the forms faxed already? Is there an extra copy? Please give forms to [place] or Lauren to fax when they are located.   [phone]

## 2015-03-08 ENCOUNTER — Ambulatory Visit (INDEPENDENT_AMBULATORY_CARE_PROVIDER_SITE_OTHER): Payer: 59 | Admitting: Family Medicine

## 2015-03-08 VITALS — BP 104/80 | HR 73 | Temp 98.0°F | Resp 18 | Ht 63.5 in | Wt 196.8 lb

## 2015-03-08 DIAGNOSIS — Z79899 Other long term (current) drug therapy: Secondary | ICD-10-CM

## 2015-03-08 DIAGNOSIS — R202 Paresthesia of skin: Secondary | ICD-10-CM

## 2015-03-08 DIAGNOSIS — M6248 Contracture of muscle, other site: Secondary | ICD-10-CM | POA: Diagnosis not present

## 2015-03-08 DIAGNOSIS — R002 Palpitations: Secondary | ICD-10-CM | POA: Diagnosis not present

## 2015-03-08 DIAGNOSIS — R55 Syncope and collapse: Secondary | ICD-10-CM | POA: Diagnosis not present

## 2015-03-08 DIAGNOSIS — F411 Generalized anxiety disorder: Secondary | ICD-10-CM

## 2015-03-08 DIAGNOSIS — R200 Anesthesia of skin: Secondary | ICD-10-CM

## 2015-03-08 DIAGNOSIS — M62838 Other muscle spasm: Secondary | ICD-10-CM
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
## 2015-03-09 LAB — IBC PANEL
%SAT: 24 % (ref 20–55)
TIBC: 375 ug/dL (ref 250–470)
UIBC: 286 ug/dL (ref 125–400)

## 2015-03-09 LAB — COMPREHENSIVE METABOLIC PANEL
ALT: 13 U/L (ref 0–35)
AST: 16 U/L (ref 0–37)
Albumin: 4.2 g/dL (ref 3.5–5.2)
Alkaline Phosphatase: 56 U/L (ref 39–117)
BUN: 9 mg/dL (ref 6–23)
CO2: 26 mEq/L (ref 19–32)
Calcium: 9.1 mg/dL (ref 8.4–10.5)
Chloride: 103 mEq/L (ref 96–112)
Creat: 0.91 mg/dL (ref 0.50–1.10)
Glucose, Bld: 91 mg/dL (ref 70–99)
Potassium: 4.3 mEq/L (ref 3.5–5.3)
Sodium: 138 mEq/L (ref 135–145)
Total Bilirubin: 0.7 mg/dL (ref 0.2–1.2)
Total Protein: 7.2 g/dL (ref 6.0–8.3)

## 2015-03-09 LAB — RPR

## 2015-03-09 LAB — HEMOGLOBIN A1C
Hgb A1c MFr Bld: 6 % — ABNORMAL HIGH (ref <5.7)
Mean Plasma Glucose: 126 mg/dL — ABNORMAL HIGH (ref <117)

## 2015-03-09 LAB — T3, FREE: T3, Free: 2.8 pg/mL (ref 2.3–4.2)

## 2015-03-09 LAB — IRON: Iron: 89 ug/dL (ref 42–145)

## 2015-03-09 LAB — VITAMIN B12: Vitamin B-12: 679 pg/mL (ref 211–911)

## 2015-03-09 LAB — T4, FREE: Free T4: 1.05 ng/dL (ref 0.80–1.80)

## 2015-03-09 LAB — TSH: TSH: 0.758 u[IU]/mL (ref 0.350–4.500)

## 2015-03-09 LAB — FERRITIN: Ferritin: 15 ng/mL (ref 10–291)

## 2015-03-09 NOTE — Telephone Encounter (Signed)
Patient called again to follow up on the status of her [place] paperwork. Is it completed? Is there a copy of it? The [facility] department does not have it. I am not sure what to tell the patient because we don't have the current paperwork that she is referring to. Patient calls [place] at least 2-3 times a day about this. Can we have an update so that we can call the patient?

## 2015-03-09 NOTE — Telephone Encounter (Signed)
Yes, I have it - placed in the disabilities box now - sorry it took so long - this was a weird form but it is done.

## 2015-03-10 LAB — VITAMIN D 25 HYDROXY (VIT D DEFICIENCY, FRACTURES): Vit D, 25-Hydroxy: 22 ng/mL — ABNORMAL LOW (ref 30–100)

## 2015-03-12 ENCOUNTER — Telehealth: Payer: Self-pay

## 2015-03-12 ENCOUNTER — Other Ambulatory Visit: Payer: Self-pay | Admitting: Family Medicine

## 2015-03-12 LAB — MAGNESIUM, RBC: Magnesium RBC: 4.9 mg/dL (ref 4.0–6.4)

## 2015-03-12 MED ORDER — ERGOCALCIFEROL 1.25 MG (50000 UT) PO CAPS: 50000.0000 [IU] | ORAL_CAPSULE | ORAL | Status: DC

## 2015-03-12 NOTE — Telephone Encounter (Signed)
Does not need referral for gyn as it is primary care - we recommended Dr. Paula Compton or Dr. Elveria Rising are the people who I have heard good things about.   Recommend NOT [facility]

## 2015-03-12 NOTE — Telephone Encounter (Signed)
Dr Brigitte Pulse- Who do you prefer?

## 2015-03-12 NOTE — Telephone Encounter (Signed)
Gave pt message.

## 2015-03-12 NOTE — Telephone Encounter (Signed)
Pt is wanting a referral from dr Brigitte Pulse for a female gyn

## 2015-03-12 NOTE — Telephone Encounter (Signed)
Also notified pt of labs. She is going to come and pick up a copy.

## 2015-03-13 ENCOUNTER — Telehealth: Payer: Self-pay | Admitting: Family Medicine

## 2015-03-13 NOTE — Telephone Encounter (Signed)
Patient came to office requesting a copy of her most recent disability form. Dr. Brigitte Pulse left a phone message on 03/09/2015 that the paperwork was finished and placed in disabilities tray. Myself or Lauren cannot locate it. I have a blank form on file. I can fill in the paperwork and have Dr. Brigitte Pulse sign.

## 2015-03-17 ENCOUNTER — Telehealth: Payer: Self-pay

## 2015-03-17 NOTE — Telephone Encounter (Signed)
Behavioral health disability form placed in diablility box.  Called pt's Engineer, water Brooke Turner and left message that we would be faxing everything ove r- gave her our clinic # and my cell # so she can call if she needs anything else to process pt's short term disability claim as this is a legitimate request which pt's disability insurance should cover so if they need anything else to authorize payment please let me knowl.

## 2015-03-17 NOTE — Telephone Encounter (Signed)
Brooke Turner returning a call to Dr Brigitte Pulse about ppw that was sent to them for additional information

## 2015-03-18 ENCOUNTER — Encounter: Payer: Self-pay | Admitting: Internal Medicine

## 2015-03-18 ENCOUNTER — Ambulatory Visit (INDEPENDENT_AMBULATORY_CARE_PROVIDER_SITE_OTHER): Payer: 59 | Admitting: Internal Medicine

## 2015-03-18 VITALS — BP 122/90 | HR 66 | Ht 64.0 in | Wt 196.0 lb

## 2015-03-18 DIAGNOSIS — R0600 Dyspnea, unspecified: Secondary | ICD-10-CM | POA: Insufficient documentation

## 2015-03-18 MED ORDER — PANTOPRAZOLE SODIUM 40 MG PO TBEC: 40.0000 mg | DELAYED_RELEASE_TABLET | Freq: Every day | ORAL | Status: DC

## 2015-03-18 MED ORDER — NEBIVOLOL HCL 5 MG PO TABS: 5.0000 mg | ORAL_TABLET | Freq: Every day | ORAL | Status: DC

## 2015-03-18 NOTE — Patient Instructions (Addendum)
Pantoprazole (protonix) 40 mg  Take 30-60 min before first meal of the day and Zantac 300 mg  one bedtime until return to office - this is the best way to tell whether stomach acid is contributing to your problem.    GERD (REFLUX)  is an extremely common cause of respiratory symptoms just like yours , many times with no obvious heartburn at all.    It can be treated with medication, but also with lifestyle changes including avoidance of late meals, excessive alcohol, smoking cessation, and avoid fatty foods, chocolate, peppermint, colas, red wine, and acidic juices such as orange juice.  NO MINT OR MENTHOL PRODUCTS SO NO COUGH DROPS  USE SUGARLESS CANDY INSTEAD (Jolley ranchers or Stover's or Life Savers) or even ice chips will also do - the key is to swallow to prevent all throat clearing. NO OIL BASED VITAMINS - use powdered substitutes.   For two days before the test take bystolic 5 mg daily instead of corevidol  Please see patient coordinator before you leave today  to schedule a methacholine challenge test in two weeks

## 2015-03-18 NOTE — Progress Notes (Signed)
Subjective:    Patient ID: Brooke Turner, female    DOB: [birth year]  MRN: [id number]  HPI  32 yobf quit smoking 08/2014 with tendency to bad bronchitis requiring sev ov's starting mid 1990s moved to [place] from [place] around 2006 and started since around 2013 noted  Onset of post nasal drainage / stuffy nose esp fall spring summer not as much in winter better on flonase then onset of dyspnea x Nov 2015 variable and not responsive to saba so referred to pulmonary clinic 03/18/2015    03/18/2015 1st [facility] office visit/ Brooke Turner   Chief Complaint  Patient presents with  . Pulmonary Consult    Referred by Dr. [name]. Pt c/o SOB for the past 7 months. She states that she feels SOB with or without exertion. She states that she notices when she is outside in the wind, and when she is drinking something. She has occ CP "comes and goes"- sometimes when she exercises she notices. She also c/o unintentional wt loss since Nov 2015.   onset of variable sob x 7 months more often at rest than exertion/ worse since quit smoking, not assoc with cough or proprotionate to ex  No obvious other patterns in day to day or daytime variabilty or assoc classic lateraliting pleuritic or ex  cp or chest tightness, subjective wheeze overt   hb symptoms. No unusual exp hx or h/o childhood pna/ asthma or knowledge of premature birth.  Sleeping ok without nocturnal  or early am exacerbation  of respiratory  c/o's or need for noct saba. Also denies any obvious fluctuation of symptoms with weather or environmental changes or other aggravating or alleviating factors except as outlined above   Current Medications, Allergies, Complete Past Medical History, Past Surgical History, Family History, and Social History were reviewed in [employer] record.           Review of Systems  Constitutional: Positive for unexpected weight change. Negative for fever and chills.  HENT: Positive for  sneezing. Negative for congestion, dental problem, ear pain, nosebleeds, postnasal drip, rhinorrhea, sinus pressure, sore throat, trouble swallowing and voice change.   Eyes: Negative for visual disturbance.  Respiratory: Positive for shortness of breath. Negative for cough and choking.   Cardiovascular: Positive for chest pain. Negative for leg swelling.  Gastrointestinal: Negative for vomiting, abdominal pain and diarrhea.  Genitourinary: Negative for difficulty urinating.  Musculoskeletal: Positive for arthralgias. Negative for back pain.  Skin: Negative for rash.  Neurological: Positive for headaches. Negative for tremors and syncope.  Hematological: Does not bruise/bleed easily.       Objective:   Physical Exam  Anxious bf nad  Wt Readings from Last 3 Encounters:  03/18/15 196 lb (88.905 kg)  03/08/15 196 lb 12.8 oz (89.268 kg)  02/25/15 199 lb 3.2 oz (90.357 kg)    Vital signs reviewed  HEENT: nl dentition, turbinates, and orophanx. Nl external ear canals without cough reflex   NECK :  without JVD/Nodes/TM/ nl carotid upstrokes bilaterally   LUNGS: no acc muscle use, clear to A and P bilaterally without cough on insp or exp maneuvers   CV:  RRR  no s3 or murmur or increase in P2, no edema   ABD:  soft and nontender with nl excursion in the supine position. No bruits or organomegaly, bowel sounds nl  MS:  warm without deformities, calf tenderness, cyanosis or clubbing  SKIN: warm and dry without lesions    NEURO:  alert, approp, no deficits      I personally reviewed images and agree with radiology impression as follows:  CXR:  12/10/14 No evidence of pulmonary embolism or other cause for shortness of breath.   Also reviewed Dr Raul Del extensive lab profile from 03/07/25 including tft's wnl       Assessment & Plan:

## 2015-03-18 NOTE — Telephone Encounter (Signed)
Completed paperwork scanned under release ID# [id number] and a copy can be found under the media tab in patient's chart.   Thanks, [employer]

## 2015-03-18 NOTE — Telephone Encounter (Signed)
This request was taken care of on 03/18/2015. It was faxed through [facility] on 03/18/2015 at 9:43am with a successful confirmation. Dr. Brigitte Pulse, Lauren and I would like to thank you for being so attentive and thorough with this patient's paperwork. We know that it is time consuming and we appreciate your help!

## 2015-03-18 NOTE — Telephone Encounter (Signed)
This has been done and faxed to [place] at [place]. Faxed through [employer] on 03/18/2015 at 9:43am.

## 2015-03-19 ENCOUNTER — Telehealth: Payer: Self-pay

## 2015-03-19 ENCOUNTER — Institutional Professional Consult (permissible substitution): Payer: 59 | Admitting: Internal Medicine

## 2015-03-19 ENCOUNTER — Encounter: Payer: Self-pay | Admitting: Internal Medicine

## 2015-03-19 NOTE — Assessment & Plan Note (Addendum)
-   03/18/2015  Walked RA x 3 laps @ 185 ft each stopped due to end of study, moderate pace, no sob or desat or tachycardia     I had an extended discussion with the patient reviewing all relevant studies completed to date and  Lasting 35 minutes on the following concerns  1) she has had an exhaustive w/u with no dx yet  2) anxiety disorder is most likely though haven't excluded occult asthma or gerd  3) best way to exclude asthma is MCT on gerd rx/ diet x 2 weeks though pt was very reluctant to follow this recommendation  4) defer rx for anxiety to primary care but if any issue of asthma and has palpitations as manifestation Strongly prefer in this setting: Bystolic, the most beta -1  selective Beta blocker available in sample form, with bisoprolol the most selective generic choice  on the market. - if fact will  Give samples of bystolic to take in the 2 days prior to MCT to avoid false positive  5) See instructions for specific recommendations which were reviewed directly with the patient who was given a copy with highlighter outlining the key components.

## 2015-03-19 NOTE — Telephone Encounter (Signed)
Dr Brigitte Pulse- Can you help with this?

## 2015-03-19 NOTE — Telephone Encounter (Signed)
Pt would like to ask if Dr. Brigitte Pulse would be able to remove a statement from her medical record due to issues it is causing every time she sees a specialist. She states that it is worded something like , " patient has a fear of prematurely dying." She states that this opens up a conversation with the specialist that is not relevant to what she is being seen for. Please advise patient

## 2015-03-30 ENCOUNTER — Telehealth: Payer: Self-pay | Admitting: Internal Medicine

## 2015-03-30 ENCOUNTER — Encounter (HOSPITAL_COMMUNITY): Payer: 59

## 2015-03-30 ENCOUNTER — Encounter: Payer: Self-pay | Admitting: Family Medicine

## 2015-03-30 NOTE — Telephone Encounter (Signed)
Pelr 03/18/15 OV w/ MW: For two days before the test take bystolic 5 mg daily instead of corevidol --  lmomtcb x1

## 2015-03-31 NOTE — Telephone Encounter (Signed)
LMTC x `1 for pt 

## 2015-04-01 ENCOUNTER — Ambulatory Visit (HOSPITAL_COMMUNITY)
Admission: RE | Admit: 2015-04-01 | Discharge: 2015-04-01 | Disposition: A | Discharge status: Home / Self Care | Payer: 59 | Source: Ambulatory Visit | Attending: Internal Medicine | Admitting: Internal Medicine

## 2015-04-01 DIAGNOSIS — R0600 Dyspnea, unspecified: Secondary | ICD-10-CM | POA: Insufficient documentation

## 2015-04-01 LAB — PULMONARY FUNCTION TEST
FEF 25-75 Post: 3.41 L/sec
FEF 25-75 Pre: 3.38 L/sec
FEF2575-%Change-Post: 0 %
FEF2575-%Pred-Post: 123 %
FEF2575-%Pred-Pre: 122 %
FEV1-%Change-Post: 1 %
FEV1-%Pred-Post: 116 %
FEV1-%Pred-Pre: 115 %
FEV1-Post: 2.87 L
FEV1-Pre: 2.84 L
FEV1FVC-%Change-Post: 0 %
FEV1FVC-%Pred-Pre: 101 %
FEV6-%Change-Post: 0 %
FEV6-%Pred-Post: 115 %
FEV6-%Pred-Pre: 114 %
FEV6-Post: 3.37 L
FEV6-Pre: 3.35 L
FEV6FVC-%Pred-Post: 102 %
FEV6FVC-%Pred-Pre: 102 %
FVC-%Change-Post: 0 %
FVC-%Pred-Post: 113 %
FVC-%Pred-Pre: 112 %
FVC-Post: 3.37 L
FVC-Pre: 3.35 L
Post FEV1/FVC ratio: 85 %
Post FEV6/FVC ratio: 100 %
Pre FEV1/FVC ratio: 85 %
Pre FEV6/FVC Ratio: 100 %

## 2015-04-01 MED ORDER — METHACHOLINE 0.25 MG/ML NEB SOLN
2.0000 mL | Freq: Once | RESPIRATORY_TRACT | Status: AC
Start: 2015-04-01 — End: 2015-04-01
  Administered 2015-04-01: 0.5 mg via RESPIRATORY_TRACT

## 2015-04-01 MED ORDER — METHACHOLINE 1 MG/ML NEB SOLN
2.0000 mL | Freq: Once | RESPIRATORY_TRACT | Status: AC
  Administered 2015-04-01: 2 mg via RESPIRATORY_TRACT

## 2015-04-01 MED ORDER — METHACHOLINE 0.0625 MG/ML NEB SOLN
2.0000 mL | Freq: Once | RESPIRATORY_TRACT | Status: AC
  Administered 2015-04-01: 0.125 mg via RESPIRATORY_TRACT

## 2015-04-01 MED ORDER — SODIUM CHLORIDE 0.9 % IN NEBU
3.0000 mL | INHALATION_SOLUTION | Freq: Once | RESPIRATORY_TRACT | Status: AC
  Administered 2015-04-01: 3 mL via RESPIRATORY_TRACT

## 2015-04-01 MED ORDER — METHACHOLINE 16 MG/ML NEB SOLN
2.0000 mL | Freq: Once | RESPIRATORY_TRACT | Status: AC
  Administered 2015-04-01: 32 mg via RESPIRATORY_TRACT

## 2015-04-01 MED ORDER — METHACHOLINE 4 MG/ML NEB SOLN
2.0000 mL | Freq: Once | RESPIRATORY_TRACT | Status: AC
  Administered 2015-04-01: 8 mg via RESPIRATORY_TRACT

## 2015-04-01 MED ORDER — ALBUTEROL SULFATE (2.5 MG/3ML) 0.083% IN NEBU
2.5000 mg | INHALATION_SOLUTION | Freq: Once | RESPIRATORY_TRACT | Status: AC
  Administered 2015-04-01: 2.5 mg via RESPIRATORY_TRACT

## 2015-04-01 NOTE — Telephone Encounter (Signed)
LMTCB x 3 

## 2015-04-02 ENCOUNTER — Encounter (HOSPITAL_COMMUNITY): Payer: 59

## 2015-04-02 NOTE — Telephone Encounter (Signed)
ATC x1 mailbox full, unable to leave voicemail per pt request.  wcb

## 2015-04-02 NOTE — Telephone Encounter (Signed)
Ok to resume but question we need to find out is ? Any difference in symptoms while on bystolic vs corevidol?

## 2015-04-02 NOTE — Telephone Encounter (Signed)
Pt returning call, needing to know if she can go  back on reg. Med says it's ok to leave a detailed msg on phone  She will be @ work and unable to answer please advise.Hillery Hunter

## 2015-04-02 NOTE — Telephone Encounter (Signed)
Pt is wanting to know if she can go ahead and return to her regular regimen now that the PFT is completed. Pt does not want to take the Bystolic any longer and wants to make sure okay to go back to Carvedilol. Pt is wanting a call back very soon, she is on a strict schedule with taking her medications and is supposed to take her BP meds soon.  Please advise Dr Melvyn Novas thanks.

## 2015-04-02 NOTE — Telephone Encounter (Signed)
Left detailed message at pt's request relaying the below to her and requesting a call back.

## 2015-04-03 NOTE — Telephone Encounter (Signed)
lmtcb for pt.  

## 2015-04-03 NOTE — Telephone Encounter (Signed)
I think the message is backwards, that she felt better on coreg and if that's so ok to switch back, if not give her samples of bystolic until next ov and we'll regroup then

## 2015-04-03 NOTE — Telephone Encounter (Signed)
Called and spoke to pt. Pt stated when she switched from the Bystolic to the Carvedilol she noticed she was more dyspneic with activity, she felt "sluggish", and mild orthostatic hypotension-dizziness when standing.   MW please advise.

## 2015-04-05 ENCOUNTER — Emergency Department (INDEPENDENT_AMBULATORY_CARE_PROVIDER_SITE_OTHER)
Admission: EM | Admit: 2015-04-05 | Discharge: 2015-04-05 | Disposition: A | Discharge status: Home / Self Care | Payer: 59 | Source: Home / Self Care | Attending: Family Medicine | Admitting: Family Medicine

## 2015-04-05 ENCOUNTER — Encounter (HOSPITAL_COMMUNITY): Payer: Self-pay | Admitting: Emergency Medicine

## 2015-04-05 DIAGNOSIS — R0789 Other chest pain: Secondary | ICD-10-CM

## 2015-04-05 MED ORDER — MELOXICAM 7.5 MG PO TABS: 7.5000 mg | ORAL_TABLET | Freq: Two times a day (BID) | ORAL | Status: DC

## 2015-04-05 NOTE — ED Provider Notes (Signed)
CSN: [id number]     Arrival date & time 04/05/15  1826 History   First MD Initiated Contact with Patient 04/05/15 1853     Chief Complaint  Patient presents with  . Chest Pain   (Consider location/radiation/quality/duration/timing/severity/associated sxs/prior Treatment) Patient is a 39 y.o. female presenting with chest pain. The history is provided by the patient.  Chest Pain Pain location:  L chest and L lateral chest Pain quality: sharp   Pain radiates to:  Does not radiate Pain radiates to the back: no   Pain severity:  Mild Onset quality:  Sudden Duration:  4 hours Progression:  Unchanged Chronicity:  New Context: movement and stress   Relieved by:  None tried Worsened by:  Nothing tried Ineffective treatments:  None tried Associated symptoms: anxiety   Associated symptoms: no cough, no palpitations and no shortness of breath     Past Medical History  Diagnosis Date  . Hypertension   . GERD (gastroesophageal reflux disease)   . Tachycardia   . Diabetes mellitus without complication   . Fibroid uterus    Past Surgical History  Procedure Laterality Date  . Wisdom tooth extraction     Family History  Problem Relation Age of Onset  . Seizures Mother   . Cancer Father     Liver  . Diabetes Father 69  . Hypertension Father   . Heart disease Father 54    CEA, LE [place]  . Alzheimer's disease Maternal Grandmother   . Heart disease Maternal Grandfather    History  Substance Use Topics  . Smoking status: Former Smoker -- 0.25 packs/day for 10 years    Types: Cigarettes    Quit date: 08/19/2014  . Smokeless tobacco: Never Used  . Alcohol Use: 0.0 oz/week    0 Standard drinks or equivalent per week     Comment: Usually 1 time a month   OB History    Gravida Para Term Preterm AB TAB SAB Ectopic Multiple Living   2 0 0 0 2 1 1 0 0 0      Review of Systems  Constitutional: Negative.   HENT: Negative.   Respiratory: Negative for cough, chest tightness,  shortness of breath and wheezing.   Cardiovascular: Positive for chest pain. Negative for palpitations and leg swelling.    Allergies  Penicillins and Tamiflu  Home Medications   Prior to Admission medications   Medication Sig Start Date End Date Taking? Authorizing Provider  carvedilol (COREG) 6.25 MG tablet Take 1 tablet (6.25 mg total) by mouth 2 (two) times daily with a meal. 02/24/15   [name], MD  ergocalciferol (VITAMIN D2) 50000 UNITS capsule Take 1 capsule (50,000 Units total) by mouth once a week. 03/12/15   [name], MD  fluticasone (FLONASE) 50 MCG/ACT nasal spray Place 1 spray into both nostrils daily as needed for allergies or rhinitis. 10/15/14   Chelle Jeffery, PA-C  hydroxypropyl methylcellulose / hypromellose (ISOPTO TEARS / GONIOVISC) 2.5 % ophthalmic solution Place 1 drop into both eyes 3 (three) times daily as needed for dry eyes.    Historical Provider, MD  LORazepam (ATIVAN) 0.5 MG tablet Take 1 tablet (0.5 mg total) by mouth 2 (two) times daily as needed for anxiety. 12/15/14   [name], MD  meloxicam (MOBIC) 7.5 MG tablet Take 1 tablet (7.5 mg total) by mouth 2 (two) times daily after a meal. 04/05/15   Billy Fischer, MD  nebivolol (BYSTOLIC) 5 MG tablet Take 1 tablet (  5 mg total) by mouth daily. 03/18/15   Tanda Rockers, MD  norethindrone (MICRONOR,CAMILA,ERRIN) 0.35 MG tablet Take 1 tablet by mouth daily.    Historical Provider, MD  pantoprazole (PROTONIX) 40 MG tablet Take 1 tablet (40 mg total) by mouth daily. Take 30-60 min before first meal of the day 03/18/15   Tanda Rockers, MD  propranolol (INDERAL) 20 MG tablet Take 1 tablet (20 mg total) by mouth 4 (four) times daily as needed (anxiety, palpitations). Patient not taking: Reported on 03/18/2015 03/03/15   [name], MD  ranitidine (ZANTAC) 150 MG tablet Take 1 tablet (150 mg total) by mouth 2 (two) times daily. 01/19/15   [name], MD   BP 156/91 mmHg  Pulse 81  Temp(Src) 98.3 F (36.8 C) (Oral)  Resp 16   SpO2 100%  LMP 03/15/2015 Physical Exam  Constitutional: She is oriented to person, place, and time. She appears well-developed and well-nourished. She appears distressed.  Neck: Normal range of motion. Neck supple.  Cardiovascular: Normal rate, regular rhythm, normal heart sounds and intact distal pulses.   Pulmonary/Chest: Effort normal and breath sounds normal. She exhibits tenderness.  Palp reproduced sx of sharp pain  Lymphadenopathy:    She has no cervical adenopathy.  Neurological: She is alert and oriented to person, place, and time.  Skin: Skin is warm and dry.  Psychiatric:  Pt extremely anxious re sx, unable to reassure and answer all questions and concerns.  Nursing note and vitals reviewed.   ED Course  Procedures (including critical care time) Labs Review Labs Reviewed - No data to display  Imaging Review No results found.   MDM   1. Chest wall pain        Billy Fischer, MD 04/05/15 [phone]

## 2015-04-05 NOTE — Discharge Instructions (Signed)
Use medicine and see your doctor if further problems

## 2015-04-05 NOTE — ED Notes (Signed)
Pt comes in with c/o sudden left chest wall pain radiating to left jaw/arm [place] the mall today Pt appears very anxious,asking a lot of questions  EKG obtained

## 2015-04-06 ENCOUNTER — Encounter (HOSPITAL_COMMUNITY): Payer: Self-pay | Admitting: *Deleted

## 2015-04-06 ENCOUNTER — Emergency Department (HOSPITAL_COMMUNITY): Payer: 59

## 2015-04-06 ENCOUNTER — Emergency Department (HOSPITAL_COMMUNITY)
Admission: EM | Admit: 2015-04-06 | Discharge: 2015-04-07 | Disposition: A | Discharge status: Home / Self Care | Payer: 59 | Attending: Emergency Medicine | Admitting: Emergency Medicine

## 2015-04-06 DIAGNOSIS — Z86018 Personal history of other benign neoplasm: Secondary | ICD-10-CM | POA: Diagnosis not present

## 2015-04-06 DIAGNOSIS — Z3202 Encounter for pregnancy test, result negative: Secondary | ICD-10-CM | POA: Insufficient documentation

## 2015-04-06 DIAGNOSIS — Z87891 Personal history of nicotine dependence: Secondary | ICD-10-CM | POA: Insufficient documentation

## 2015-04-06 DIAGNOSIS — Z79899 Other long term (current) drug therapy: Secondary | ICD-10-CM | POA: Diagnosis not present

## 2015-04-06 DIAGNOSIS — E119 Type 2 diabetes mellitus without complications: Secondary | ICD-10-CM | POA: Diagnosis not present

## 2015-04-06 DIAGNOSIS — I10 Essential (primary) hypertension: Secondary | ICD-10-CM | POA: Diagnosis not present

## 2015-04-06 DIAGNOSIS — Z880 Allergy status to penicillin: Secondary | ICD-10-CM | POA: Insufficient documentation

## 2015-04-06 DIAGNOSIS — F419 Anxiety disorder, unspecified: Secondary | ICD-10-CM | POA: Insufficient documentation

## 2015-04-06 DIAGNOSIS — R0602 Shortness of breath: Secondary | ICD-10-CM | POA: Diagnosis not present

## 2015-04-06 DIAGNOSIS — K219 Gastro-esophageal reflux disease without esophagitis: Secondary | ICD-10-CM | POA: Insufficient documentation

## 2015-04-06 DIAGNOSIS — R0789 Other chest pain: Secondary | ICD-10-CM | POA: Insufficient documentation

## 2015-04-06 DIAGNOSIS — R079 Chest pain, unspecified: Secondary | ICD-10-CM | POA: Diagnosis present

## 2015-04-06 DIAGNOSIS — R61 Generalized hyperhidrosis: Secondary | ICD-10-CM | POA: Diagnosis not present

## 2015-04-06 LAB — CBC WITH DIFFERENTIAL/PLATELET
Basophils Absolute: 0 10*3/uL (ref 0.0–0.1)
Basophils Relative: 0 % (ref 0–1)
Eosinophils Absolute: 0.1 10*3/uL (ref 0.0–0.7)
Eosinophils Relative: 1 % (ref 0–5)
HCT: 39.2 % (ref 36.0–46.0)
Hemoglobin: 13.5 g/dL (ref 12.0–15.0)
Lymphocytes Relative: 52 % — ABNORMAL HIGH (ref 12–46)
Lymphs Abs: 5.3 10*3/uL — ABNORMAL HIGH (ref 0.7–4.0)
MCH: 29.7 pg (ref 26.0–34.0)
MCHC: 34.4 g/dL (ref 30.0–36.0)
MCV: 86.2 fL (ref 78.0–100.0)
Monocytes Absolute: 0.6 10*3/uL (ref 0.1–1.0)
Monocytes Relative: 5 % (ref 3–12)
Neutro Abs: 4.2 10*3/uL (ref 1.7–7.7)
Neutrophils Relative %: 42 % — ABNORMAL LOW (ref 43–77)
Platelets: 302 10*3/uL (ref 150–400)
RBC: 4.55 MIL/uL (ref 3.87–5.11)
RDW: 13.3 % (ref 11.5–15.5)
WBC: 10.2 10*3/uL (ref 4.0–10.5)

## 2015-04-06 LAB — BASIC METABOLIC PANEL
Anion gap: 8 (ref 5–15)
BUN: 9 mg/dL (ref 6–20)
CO2: 23 mmol/L (ref 22–32)
Calcium: 8.7 mg/dL — ABNORMAL LOW (ref 8.9–10.3)
Chloride: 106 mmol/L (ref 101–111)
Creatinine, Ser: 1.07 mg/dL — ABNORMAL HIGH (ref 0.44–1.00)
GFR calc Af Amer: >60 mL/min (ref >60)
GFR calc non Af Amer: >60 mL/min (ref >60)
Glucose, Bld: 103 mg/dL — ABNORMAL HIGH (ref 65–99)
Potassium: 3.6 mmol/L (ref 3.5–5.1)
Sodium: 137 mmol/L (ref 135–145)

## 2015-04-06 LAB — POC URINE PREG, ED: Preg Test, Ur: NEGATIVE

## 2015-04-06 LAB — I-STAT TROPONIN, ED: Troponin i, poc: 0 ng/mL (ref 0.00–0.08)

## 2015-04-06 NOTE — ED Provider Notes (Signed)
CSN: [id number]     Arrival date & time 04/06/15  2141 History   First MD Initiated Contact with Patient 04/06/15 2219     Chief Complaint  Patient presents with  . Chest Pain     (Consider location/radiation/quality/duration/timing/severity/associated sxs/prior Treatment) Patient is a 39 y.o. female presenting with chest pain. The history is provided by the patient.  Chest Pain Pain location:  L chest Pain quality: aching, sharp and stabbing   Pain radiates to:  L jaw Pain radiates to the back: no   Pain severity:  Mild Onset quality:  Gradual Duration:  2 days Timing:  Intermittent Progression:  Worsening Chronicity:  New Context: at rest   Context: not lifting, no movement and not raising an arm   Relieved by:  Nothing Worsened by:  Nothing tried Ineffective treatments:  None tried Associated symptoms: anxiety, diaphoresis and shortness of breath   Associated symptoms: no back pain, no cough, no dizziness, no fever, no lower extremity edema, no nausea, no numbness and not vomiting     Past Medical History  Diagnosis Date  . Hypertension   . GERD (gastroesophageal reflux disease)   . Tachycardia   . Diabetes mellitus without complication   . Fibroid uterus    Past Surgical History  Procedure Laterality Date  . Wisdom tooth extraction     Family History  Problem Relation Age of Onset  . Seizures Mother   . Cancer Father     Liver  . Diabetes Father 52  . Hypertension Father   . Heart disease Father 70    CEA, LE [place]  . Alzheimer's disease Maternal Grandmother   . Heart disease Maternal Grandfather    History  Substance Use Topics  . Smoking status: Former Smoker -- 0.25 packs/day for 10 years    Types: Cigarettes    Quit date: 08/19/2014  . Smokeless tobacco: Never Used  . Alcohol Use: 0.0 oz/week    0 Standard drinks or equivalent per week     Comment: Usually 1 time a month   OB History    Gravida Para Term Preterm AB TAB SAB Ectopic Multiple  Living   2 0 0 0 2 1 1 0 0 0      Review of Systems  Constitutional: Positive for diaphoresis. Negative for fever.  Respiratory: Positive for shortness of breath. Negative for cough.   Cardiovascular: Positive for chest pain.  Gastrointestinal: Negative for nausea and vomiting.  Musculoskeletal: Negative for back pain.  Neurological: Negative for dizziness and numbness.      Allergies  Penicillins and Tamiflu  Home Medications   Prior to Admission medications   Medication Sig Start Date End Date Taking? Authorizing Provider  carvedilol (COREG) 6.25 MG tablet Take 1 tablet (6.25 mg total) by mouth 2 (two) times daily with a meal. Patient taking differently: Take 3.125 mg by mouth 2 (two) times daily with a meal.  02/24/15  Yes [name], MD  ergocalciferol (VITAMIN D2) 50000 UNITS capsule Take 1 capsule (50,000 Units total) by mouth once a week. 03/12/15  Yes [name], MD  fluticasone (FLONASE) 50 MCG/ACT nasal spray Place 1 spray into both nostrils daily as needed for allergies or rhinitis. 10/15/14  Yes Chelle Jeffery, PA-C  hydroxypropyl methylcellulose / hypromellose (ISOPTO TEARS / GONIOVISC) 2.5 % ophthalmic solution Place 1 drop into both eyes 3 (three) times daily as needed for dry eyes.   Yes Historical Provider, MD  LORazepam (ATIVAN) 0.5 MG tablet Take  1 tablet (0.5 mg total) by mouth 2 (two) times daily as needed for anxiety. 12/15/14  Yes [name], MD  Norethindrone-Ethinyl Estradiol-Fe Biphas (LO LOESTRIN FE) 1 MG-10 MCG / 10 MCG tablet Take 1 tablet by mouth daily.   Yes Historical Provider, MD  ranitidine (ZANTAC) 150 MG tablet Take 1 tablet (150 mg total) by mouth 2 (two) times daily. 01/19/15  Yes [name], MD  Ferrous Sulfate (IRON) 325 (65 FE) MG TABS Take 1 tablet by mouth daily.    Historical Provider, MD  meloxicam (MOBIC) 7.5 MG tablet Take 1 tablet (7.5 mg total) by mouth 2 (two) times daily after a meal. Patient not taking: Reported on 04/06/2015 04/05/15   Billy Fischer, MD  propranolol (INDERAL) 20 MG tablet Take 1 tablet (20 mg total) by mouth 4 (four) times daily as needed (anxiety, palpitations). Patient not taking: Reported on 03/18/2015 03/03/15   [name], MD   BP 122/67 mmHg  Pulse 61  Temp(Src) 98.1 F (36.7 C) (Oral)  Resp 18  Ht 5\' 3"  (1.6 m)  Wt 196 lb (88.905 kg)  BMI 34.73 kg/m2  SpO2 100%  LMP 03/15/2015 Physical Exam  Constitutional: She appears well-developed and well-nourished.  HENT:  Head: Normocephalic.  Eyes: Pupils are equal, round, and reactive to light.  Neck: Normal range of motion.  Cardiovascular: Normal rate and regular rhythm.   Pulmonary/Chest: Effort normal and breath sounds normal. She exhibits tenderness.    Abdominal: Soft.  Musculoskeletal: Normal range of motion. She exhibits no edema or tenderness.  Neurological: She is alert.  Skin: Skin is warm.  Nursing note and vitals reviewed.   ED Course  Procedures (including critical care time) Labs Review Labs Reviewed  BASIC METABOLIC PANEL - Abnormal; Notable for the following:    Glucose, Bld 103 (*)    Creatinine, Ser 1.07 (*)    Calcium 8.7 (*)    All other components within normal limits  CBC WITH DIFFERENTIAL/PLATELET - Abnormal; Notable for the following:    Neutrophils Relative % 42 (*)    Lymphocytes Relative 52 (*)    Lymphs Abs 5.3 (*)    All other components within normal limits  D-DIMER, QUANTITATIVE (NOT AT [facility])  Randolm Idol, ED  POC URINE PREG, ED    Imaging Review Dg Chest 2 View  04/06/2015   CLINICAL DATA:  Acute onset of generalized chest pain and shortness of breath. Left arm weakness. Initial encounter.  EXAM: CHEST  2 VIEW  COMPARISON:  Chest radiograph from 12/09/2014, and CTA of the chest performed 12/10/2014  FINDINGS: The lungs are well-aerated and clear. There is no evidence of focal opacification, pleural effusion or pneumothorax.  The heart is normal in size; the mediastinal contour is within normal limits.  No acute osseous abnormalities are seen.  IMPRESSION: No acute cardiopulmonary process seen.   Electronically Signed   By: Garald Balding M.D.   On: 04/06/2015 22:54     EKG Interpretation None    evaluation negative for cardiac pathology and PE patient reassured  To FU with PCP   MDM   Final diagnoses:  Chest wall pain       Junius Creamer, NP 04/07/15 1950  Evelina Bucy, MD 04/09/15 1820

## 2015-04-06 NOTE — ED Notes (Signed)
Patient presents from home stating she started with left chest pain about 230-3 yesterday and went to Urgent Care.  EKG was done no labs and given a prescription for anti inflammatory

## 2015-04-07 LAB — D-DIMER, QUANTITATIVE (NOT AT ARMC): D-Dimer, Quant: 0.44 ug/mL-FEU (ref 0.00–0.48)

## 2015-04-07 NOTE — Discharge Instructions (Signed)
Chest Wall Pain Chest wall pain is pain felt in or around the chest bones and muscles. It may take up to 6 weeks to get better. It may take longer if you are active. Chest wall pain can happen on its own. Other times, things like germs, injury, coughing, or exercise can cause the pain. HOME CARE   Avoid activities that make you tired or cause pain. Try not to use your chest, belly (abdominal), or side muscles. Do not use heavy weights.  Put ice on the sore area.  Put ice in a plastic bag.  Place a towel between your skin and the bag.  Leave the ice on for 15-20 minutes for the first 2 days.  Only take medicine as told by your doctor. GET HELP RIGHT AWAY IF:   You have more pain or are very uncomfortable.  You have a fever.  Your chest pain gets worse.  You have new problems.  You feel sick to your stomach (nauseous) or throw up (vomit).  You start to sweat or feel lightheaded.  You have a cough with mucus (phlegm).  You cough up blood. MAKE SURE YOU:   Understand these instructions.  Will watch your condition.  Will get help right away if you are not doing well or get worse. Document Released: 04/11/2008 Document Revised: 01/16/2012 Document Reviewed: 06/20/2011 [facility] Patient Information 2015 [place], [place]. This information is not intended to replace advice given to you by your health care provider. Make sure you discuss any questions you have with your health care provider. Tonight your chest xray, cardiac markers, lab values and the D Dimer  That rules out blood clot in the lungs is normal Please make an appointment with your PCP for follow up care. It is safe to take the Ibuprofen as prescribed by Dr. Melanee Spry yesterday on a regular basis for th next several days to a week for the muscle pain

## 2015-04-07 NOTE — ED Notes (Signed)
Patient is alert and orientedx4.  Patient was explained discharge instructions and they understood them with no questions.   

## 2015-04-07 NOTE — Telephone Encounter (Signed)
lmtcb X2 for pt to clarify.

## 2015-04-08 NOTE — Telephone Encounter (Signed)
Pt returned call - [phone]. She states can leave her a detailed message.

## 2015-04-08 NOTE — Telephone Encounter (Signed)
lmtcb

## 2015-04-08 NOTE — Telephone Encounter (Signed)
lmtcb x3 for pt. 

## 2015-04-09 NOTE — Telephone Encounter (Signed)
Left detailed message on pt's VM asking for verification that felt better on the COREG rather than the Bystolic and if so then she can continue her regular regimen as stated by MW below.  Advised pt that when she calls she may leave this verification with the schedulers to reduce 'phone-tag'.  **When pt returns call, if the above is correct then message should be able to be close.

## 2015-04-09 NOTE — Telephone Encounter (Signed)
Pt called in and was wondering about the message she left for Dr Brigitte Pulse on 03-30-15 through my chart. She states she has not heard for Dr Brigitte Pulse concerning her message. She also hurt her wrist and would like to talk about that also. Thank you

## 2015-04-09 NOTE — Telephone Encounter (Signed)
Called and spoke to pt. Pt stated she felt more fatigued with the Coreg than the Bystolic and then when questioned if the Coreg made her feel better the pt stated she couldn't notice much difference. Pt stated she will continue with the Coreg. Pt stated she does not need a refill. Nothing further needed at this time.

## 2015-04-15 ENCOUNTER — Ambulatory Visit (INDEPENDENT_AMBULATORY_CARE_PROVIDER_SITE_OTHER): Payer: 59 | Admitting: Family Medicine

## 2015-04-15 ENCOUNTER — Telehealth: Payer: Self-pay | Admitting: Cardiology

## 2015-04-15 ENCOUNTER — Encounter (HOSPITAL_COMMUNITY): Payer: 59

## 2015-04-15 ENCOUNTER — Ambulatory Visit (INDEPENDENT_AMBULATORY_CARE_PROVIDER_SITE_OTHER): Payer: 59

## 2015-04-15 VITALS — BP 136/78 | HR 82 | Temp 99.0°F | Resp 17 | Ht 63.0 in | Wt 194.0 lb

## 2015-04-15 DIAGNOSIS — S6992XA Unspecified injury of left wrist, hand and finger(s), initial encounter: Secondary | ICD-10-CM

## 2015-04-15 NOTE — Telephone Encounter (Signed)
Pt called in wanting to know if she could be seen today because she is still having some chest pain and she stated that last night numbess in her hands

## 2015-04-15 NOTE — Telephone Encounter (Signed)
Spoke with patient who reports numbness in hands & arms. She reports feeling tight in her face. She reports shooting pain in chest and left arm pain. She reports she has been "dropping things and has tingling in her arms.   She reports chest pain for 4-5 days. She complains of SOB. She reports vertigo really bad 2 nights ago and the room was spinning.   Unable to add patient on to MD schedule at [facility] office today.  Advised that patient contact our office tomorrow if she needs an earlier visit than 6/15 and can try to add to flex clinic schedule.  Patient then states she wanted the number to the [facility] office to inquire of appointment for today. Number provided.

## 2015-04-15 NOTE — Progress Notes (Addendum)
Subjective:   This chart was scribed for Dr. [name], MD by Erling Conte, ED Scribe. This patient was seen in Room 1 and the patient's care was started at 8:58 PM.   Patient ID: Brooke Turner, female    DOB: [birth year], 39 y.o.   MRN: [id number]  Chief Complaint  Patient presents with  . Wrist Pain    started 4 days ago, swollen hand, doesnt have a tighe grip on things   . Dizziness  . Shortness of Breath    HPI HPI Comments: Brooke Turner is a 39 y.o. female who [facility] the [facility] complaining of constant, moderate, left wrist pain onset 4 days ago. Pt states she injured it 4 days ago when it got caught on a metal object and was jerked backwards. She is having associated swelling and decreased grip strength in her left hand. Pt notes that 2 nights ago she had an episode of dizziness for 5 seconds and it went away and came back a few minutes later. She denies any episodes since. She denies any URI symptoms. Pt iced the area with no relief. She denies any other complaints with her wrist.   Pt has a h/o anxiety and has been going to mood therapy. Pt was given a Zoloft prescription but has not filled it yet. She states her anxiety symptoms have been coming back. Pt notes she has been having panic attacks but feels like they are more controlled. She notes she has been having increased heart palpitations. She has not been taking her lorazepam prescription. She checks her HR frequently with a pulse ox and states her pulse runs in the 50-60s normally and notes this could be the cause of her fatigue. She also checks her BP readings frequently and states they have been normal   Patient Active Problem List   Diagnosis Date Noted  . Dyspnea 03/18/2015  . Post-traumatic stress reaction 02/25/2015  . Anxiety disorder due to general medical condition with panic attack 02/25/2015  . Subserous leiomyoma of uterus 02/25/2015  . Anxiety about health  02/25/2015  . Snorings 09/30/2014  . Fatigue 09/26/2014  . Dizziness and giddiness 09/11/2014  . Tachycardia 09/11/2014  . Precordial chest pain 09/11/2014  . Prediabetes 08/26/2014  . Acanthosis nigricans 03/19/2014  . Type II or unspecified type diabetes mellitus without mention of complication, not stated as uncontrolled 07/15/2013  . Vitamin D deficiency 07/15/2013  . Large breasts 09/12/2012  . HTN (hypertension) 02/17/2012  . Contraception management 02/17/2012  . Obesity, Class III, BMI 40-49.9 (morbid obesity) 02/17/2012  . Anxiety disorder 02/17/2012  . Chronic back pain 02/17/2012   Past Medical History  Diagnosis Date  . Hypertension   . GERD (gastroesophageal reflux disease)   . Tachycardia   . Diabetes mellitus without complication   . Fibroid uterus    Past Surgical History  Procedure Laterality Date  . Wisdom tooth extraction     Allergies  Allergen Reactions  . Penicillins Hives  . Tamiflu [Oseltamivir Phosphate] Nausea And Vomiting   Prior to Admission medications   Medication Sig Start Date End Date Taking? Authorizing Provider  carvedilol (COREG) 6.25 MG tablet Take 1 tablet (6.25 mg total) by mouth 2 (two) times daily with a meal. Patient taking differently: Take 3.125 mg by mouth 2 (two) times daily with a meal.  02/24/15  Yes [name], MD  ergocalciferol (VITAMIN D2) 50000 UNITS capsule Take 1 capsule (50,000 Units total) by  mouth once a week. 03/12/15  Yes [name], MD  Ferrous Sulfate (IRON) 325 (65 FE) MG TABS Take 1 tablet by mouth daily.   Yes Historical Provider, MD  fluticasone (FLONASE) 50 MCG/ACT nasal spray Place 1 spray into both nostrils daily as needed for allergies or rhinitis. 10/15/14  Yes Chelle Jeffery, PA-C  hydroxypropyl methylcellulose / hypromellose (ISOPTO TEARS / GONIOVISC) 2.5 % ophthalmic solution Place 1 drop into both eyes 3 (three) times daily as needed for dry eyes.   Yes Historical Provider, MD  LORazepam (ATIVAN) 0.5 MG  tablet Take 1 tablet (0.5 mg total) by mouth 2 (two) times daily as needed for anxiety. 12/15/14  Yes [name], MD  Norethindrone-Ethinyl Estradiol-Fe Biphas (LO LOESTRIN FE) 1 MG-10 MCG / 10 MCG tablet Take 1 tablet by mouth daily.   Yes Historical Provider, MD  ranitidine (ZANTAC) 150 MG tablet Take 1 tablet (150 mg total) by mouth 2 (two) times daily. 01/19/15  Yes [name], MD  meloxicam (MOBIC) 7.5 MG tablet Take 1 tablet (7.5 mg total) by mouth 2 (two) times daily after a meal. Patient not taking: Reported on 04/06/2015 04/05/15   Billy Fischer, MD  propranolol (INDERAL) 20 MG tablet Take 1 tablet (20 mg total) by mouth 4 (four) times daily as needed (anxiety, palpitations). Patient not taking: Reported on 03/18/2015 03/03/15   [name], MD   History   Social History  . Marital Status: Single    Spouse Name: N/A  . Number of Children: 0  . Years of Education: 16   Occupational History  . Customer Service [employer]   Social History Main Topics  . Smoking status: Former Smoker -- 0.25 packs/day for 10 years    Types: Cigarettes    Quit date: 08/19/2014  . Smokeless tobacco: Never Used  . Alcohol Use: 0.0 oz/week    0 Standard drinks or equivalent per week     Comment: Usually 1 time a month  . Drug Use: No  . Sexual Activity:    Partners: Male    Birth Control/ Protection: Other-see comments     Comment: NuvaRing   Other Topics Concern  . Not on file   Social History Narrative   Patient lives at home alone .   Patient works full time at [employer]   Education college.   Right handed.   Caffeine none     Review of Systems  HENT: Negative for congestion and sinus pressure.   Respiratory: Negative for cough.   Cardiovascular: Positive for palpitations (due to anxiety).  Musculoskeletal: Positive for joint swelling and arthralgias.  Neurological: Positive for dizziness.  Psychiatric/Behavioral: The patient is nervous/anxious.   All other systems reviewed and  are negative.      Objective:  Triage Vitals: BP 136/78 mmHg  Pulse 82  Temp(Src) 99 F (37.2 C) (Oral)  Resp 17  Ht 5\' 3"  (1.6 m)  Wt 194 lb (87.998 kg)  BMI 34.37 kg/m2  SpO2 99%  LMP 03/15/2015   Physical Exam  Constitutional: She is oriented to person, place, and time. She appears well-developed and well-nourished. No distress.  HENT:  Head: Normocephalic and atraumatic.  Right Ear: Tympanic membrane normal.  Left Ear: Tympanic membrane normal.  Nose: Nose normal.  Mouth/Throat: Oropharynx is clear and moist.  Eyes: Conjunctivae and EOM are normal.  Neck: Neck supple. No tracheal deviation present.  Cardiovascular: Normal rate.   Pulses:      Radial pulses  are 2+ on the right side, and 2+ on the left side.  Pulmonary/Chest: Effort normal. No respiratory distress.  Musculoskeletal: Normal range of motion.  Focal swelling and mild tenderness over distal aspect of radius. 5/5 flexion and extension strength. Normal pronation and supination  Neurological: She is alert and oriented to person, place, and time.  Skin: Skin is warm and dry.  Psychiatric: She has a normal mood and affect. Her behavior is normal.  Nursing note and vitals reviewed.  [facility] reading (PRIMARY) by  Dr. Brigitte Pulse  Soft tissue swelling, no acute fracture. Question of bone contusion at radial styloid.      Assessment & Plan:   1. Left wrist injury, initial encounter   Pt reassured - suspect bone bruise, wear thumb spica splint during day, cont to ice and elev, prn nsaids. RTC if sxs persist or worsen  2.  Palpitations and CP - due to panic/anxiety - pt having sig improvement on carvedilol 3.125 bid but still has breakthrough sxs as well as med causing sig sedation.  Still has not tried pravastatin at all as was worried about interaction w/ coreg. Discussed that pt may respond better -  Hopefully w/ less fatigue to the pravastatin but pt would need to do LA version for compliance - she will consider trying  pravastatin during day and coreg in evening  [facility] but having trouble scheduling again due to work hours - likely need to call to see if they want to start her on zoloft.  Orders Placed This Encounter  Procedures  . DG Wrist Complete Left    Standing Status: Future     Number of Occurrences: 1     Standing Expiration Date: 04/22/2015    Order Specific Question:  Reason for Exam (SYMPTOM  OR DIAGNOSIS REQUIRED)    Answer:  pain and swelling over radial aspect    Order Specific Question:  Is the patient pregnant?    Answer:  No    Order Specific Question:  Preferred imaging location?    Answer:  External   Over 40 min spent in face-to-face evaluation of and consultation with patient and coordination of care.  Over 50% of this time was spent counseling this patient.    I personally performed the services described in this documentation, which was scribed in my presence. The recorded information has been reviewed and considered, and addended by me as needed.  [name], MD MPH

## 2015-04-16 NOTE — Telephone Encounter (Signed)
Patient was seen by PCP 6/8 - PCP in [facility]

## 2015-04-16 NOTE — Telephone Encounter (Signed)
Discussed w/ pt - difficult to do but will try if i can find where it is documented. Will try to remove from problem list at least.

## 2015-04-22 ENCOUNTER — Ambulatory Visit (HOSPITAL_COMMUNITY): Payer: 59 | Attending: Cardiology

## 2015-04-22 ENCOUNTER — Encounter: Payer: Self-pay | Admitting: Cardiology

## 2015-04-22 ENCOUNTER — Ambulatory Visit (INDEPENDENT_AMBULATORY_CARE_PROVIDER_SITE_OTHER): Payer: 59 | Admitting: Cardiology

## 2015-04-22 ENCOUNTER — Telehealth: Payer: Self-pay

## 2015-04-22 VITALS — BP 160/93 | HR 75 | Ht 63.0 in | Wt 190.4 lb

## 2015-04-22 DIAGNOSIS — N289 Disorder of kidney and ureter, unspecified: Secondary | ICD-10-CM

## 2015-04-22 DIAGNOSIS — R202 Paresthesia of skin: Secondary | ICD-10-CM | POA: Diagnosis not present

## 2015-04-22 DIAGNOSIS — R079 Chest pain, unspecified: Secondary | ICD-10-CM

## 2015-04-22 DIAGNOSIS — R200 Anesthesia of skin: Secondary | ICD-10-CM

## 2015-04-22 NOTE — Patient Instructions (Signed)
Medication Instructions:   CONTINUE PROPANOLOL 20 MG AS NEEDED  STOP CARVEDILOL   Labwork:   BMET IN ONE WEEK   Testing/Procedures:   Follow-Up:  WITH [facility] NEXT AVAILABLE APPT   Any Other Special Instructions Will Be Listed Below (If Applicable).

## 2015-04-22 NOTE — Progress Notes (Signed)
7/61/9509 Brooke Turner   [birth year]  [id number]  Primary Physician [name], MD Primary Cardiologist: Dr. Percival Spanish  Reason For Visit/CC: Palpitations/ Dizziness/ Fatigue/ Left Sided Chest Pain  HPI:  39 y/o female, seen by Dr. Percival Spanish 12/02/14 for evaluation for dizzness, presyncope and chest pain. 21 day event monitor showed predominantly sinus rhythm. There was some occasional sinus tachycardia with rates of about 100. She said these happened to her when she was at rest. There were rare ectopic beats but no significant sustained dysrhythmias. An echocardiogram 09/2014 was unremarkable. EF was 60-65%. Dr. Percival Spanish decided to treat her symptoms with a low dose CCB (BB avoided due to asthma). There was a low suspicion of obstructive coronary disease, however Dr.Hochrein recommended screening  her with a POET (Plain Old Exercise Treadmill). This was negative for ischemia.   The patient presents to clinic today with multiple complaints, including recurrent palpitations, dizziness, near syncope, fatigue and left sided chest pain. She reports that she never started Cardizem as previously dirrected by Dr. Percival Spanish. Her PCP put her on BB therapy with Coreg. She reports medication compliance with Coreg however she is on a very low dose of 3.25 mg BID. She states she has been unable to tolerate higher doses due to fatigue. Her PCP recentely advised that she stop Coreg and take propanolol PRN for palpitations and anxiety. However, the patient has also been noncompliant with these orders and have not yet tried propanolol. She is fearful that theses medications will not work and will cause additional side-effects.    In regards to her chest pain, she notes left sided, upper chest wall pain. Sharp and stabbing characteristics. Pleuritic pain. Non exertional. No associated dyspnea. Reproducible with palpation of chest wall. She denies any recent injury, trauma or heavy physical activity. She recently  presented to an urgent care and was diagnosed with chest wall pain. The following day, she went to the ED on 5/30 for a second opinion, and it was also felt that her pain was musculoskeletal. D-dimer and troponin were both negative. CBC and BMP both normal. She was instructed to treat with NSAIDs. She reports that she hasn't had much relief with ibuprofen and she is convinced that she needs a heart cath.    Current Outpatient Prescriptions  Medication Sig Dispense Refill  . ergocalciferol (VITAMIN D2) 50000 UNITS capsule Take 1 capsule (50,000 Units total) by mouth once a week. 12 capsule 1  . Ferrous Sulfate (IRON) 325 (65 FE) MG TABS Take 1 tablet by mouth daily.    . fluticasone (FLONASE) 50 MCG/ACT nasal spray Place 1 spray into both nostrils daily as needed for allergies or rhinitis. 16 g 12  . hydroxypropyl methylcellulose / hypromellose (ISOPTO TEARS / GONIOVISC) 2.5 % ophthalmic solution Place 1 drop into both eyes 3 (three) times daily as needed for dry eyes.    . IBUPROFEN PO Take by mouth as needed.    Marland Kitchen LORazepam (ATIVAN) 0.5 MG tablet Take 1 tablet (0.5 mg total) by mouth 2 (two) times daily as needed for anxiety. 30 tablet 1  . Norethindrone-Ethinyl Estradiol-Fe Biphas (LO LOESTRIN FE) 1 MG-10 MCG / 10 MCG tablet Take 1 tablet by mouth daily.    . propranolol (INDERAL) 20 MG tablet Take 1 tablet (20 mg total) by mouth 4 (four) times daily as needed (anxiety, palpitations). 120 tablet 0  . ranitidine (ZANTAC) 150 MG tablet Take 1 tablet (150 mg total) by mouth 2 (two) times daily. 180 tablet 3  No current facility-administered medications for this visit.    Allergies  Allergen Reactions  . Penicillins Hives  . Tamiflu [Oseltamivir Phosphate] Nausea And Vomiting    History   Social History  . Marital Status: Single    Spouse Name: N/A  . Number of Children: 0  . Years of Education: 16   Occupational History  . Customer Service [employer]   Social History Main  Topics  . Smoking status: Former Smoker -- 0.25 packs/day for 10 years    Types: Cigarettes    Quit date: 08/19/2014  . Smokeless tobacco: Never Used  . Alcohol Use: 0.0 oz/week    0 Standard drinks or equivalent per week     Comment: Usually 1 time a month  . Drug Use: No  . Sexual Activity:    Partners: Male    Birth Control/ Protection: Other-see comments     Comment: NuvaRing   Other Topics Concern  . Not on file   Social History Narrative   Patient lives at home alone .   Patient works full time at [employer]   Education college.   Right handed.   Caffeine none     Review of Systems: General: negative for chills, fever, night sweats or weight changes.  Cardiovascular: negative for chest pain, dyspnea on exertion, edema, orthopnea, palpitations, paroxysmal nocturnal dyspnea or shortness of breath Dermatological: negative for rash Respiratory: negative for cough or wheezing Urologic: negative for hematuria Abdominal: negative for nausea, vomiting, diarrhea, bright red blood per rectum, melena, or hematemesis Neurologic: negative for visual changes, syncope, or dizziness All other systems reviewed and are otherwise negative except as noted above.    Blood pressure 160/93, pulse 75, height 5\' 3"  (1.6 m), weight 190 lb 6.4 oz (86.365 kg), last menstrual period 03/15/2015.  General appearance: alert, cooperative and no distress Neck: no carotid bruit and no JVD Lungs: clear to auscultation bilaterally Heart: regular rate and rhythm, S1, S2 normal, no murmur, click, rub or gallop Extremities: no LEE Pulses: 2+ and symmetric Skin: warm and dry Neurologic: Grossly normal  EKG NSR. 75 bpm   ASSESSMENT AND PLAN:   1. Chest Pain: atypical and c/w musculoskeletal pain (extremely tender w/ reproducible pain with palpation of the left upper chest wall). Recent w/u in the ED was unremarkable for PE (negative D-dimer). Patient is urging to get a LHC ordered. Very low likelihood  for CAD (young age, little cardiac risk factors, atypical symptoms, recent normal ETT). I do not believe a LHC is warranted. Continue NSAIDs PRN for musculoskeletal pain. F/u BMP in 1 week.   2. Palpitations: Recent evaluation with 21 day event monitor 1/16 showed predominantly sinus rhythm. There was some occasional sinus tachycardia with rates of about 100. She said these happened to her when she was at rest. There were rare ectopic beats but no significant sustained dysrhythmias. Additional W/u including TSH, 2D echo and ETT were all unremarkable. EKG today shows NSR with HR of 75 bpm. No PVCs. Patient is very disagreeable with recommendations and has been noncompliant with previous therapies (failed to take Cardizem and propanolol as directed). I think she has a lot of anxiety, which may be exacerbating her problem. I agree with her PCP that she may benefit from PRN propanolol for palpitations and anxiety. I don't think she is getting much benefit from low dose Coreg. I recommended that she stop this and try propanolol. If no improvement, then we can later try Cardizem as suggested by Dr. Percival Spanish.  PLAN  F/u BMP in 1 week as she is taking NSAID for muskuloskeletal pain.   Lyda Jester PA-C 04/22/15 4:22 PM

## 2015-04-22 NOTE — Telephone Encounter (Signed)
Pt is needing to talk with dr Brigitte Pulse about xray readings and would like to let her know that she is putting a my chart message in to her

## 2015-04-23 ENCOUNTER — Encounter: Payer: Self-pay | Admitting: Family Medicine

## 2015-04-23 NOTE — Telephone Encounter (Signed)
Please advise 

## 2015-04-23 NOTE — Telephone Encounter (Signed)
Read pt's MyChart email and called pt - LVM - recommend she try the propranolol and then as long as she knows she does ok on it I would be happy to call her in a LA version - ok to try the propranolol 20 in addition to the carvedilol 3.125 or she can use it tomorrow instead of the carvedilol, then let me know if how she does and we can switch her over to LA version if appropriate.

## 2015-04-25 ENCOUNTER — Other Ambulatory Visit: Payer: Self-pay | Admitting: Family Medicine

## 2015-04-27 ENCOUNTER — Other Ambulatory Visit: Payer: Self-pay | Admitting: Family Medicine

## 2015-04-27 DIAGNOSIS — I739 Peripheral vascular disease, unspecified: Secondary | ICD-10-CM

## 2015-04-29 ENCOUNTER — Other Ambulatory Visit: Payer: 59

## 2015-04-30 ENCOUNTER — Other Ambulatory Visit (INDEPENDENT_AMBULATORY_CARE_PROVIDER_SITE_OTHER): Payer: 59 | Admitting: *Deleted

## 2015-04-30 ENCOUNTER — Ambulatory Visit (HOSPITAL_COMMUNITY): Payer: 59 | Attending: Cardiovascular Disease

## 2015-04-30 DIAGNOSIS — E119 Type 2 diabetes mellitus without complications: Secondary | ICD-10-CM | POA: Insufficient documentation

## 2015-04-30 DIAGNOSIS — I10 Essential (primary) hypertension: Secondary | ICD-10-CM | POA: Insufficient documentation

## 2015-04-30 DIAGNOSIS — N289 Disorder of kidney and ureter, unspecified: Secondary | ICD-10-CM

## 2015-04-30 DIAGNOSIS — R202 Paresthesia of skin: Secondary | ICD-10-CM | POA: Diagnosis not present

## 2015-04-30 DIAGNOSIS — I739 Peripheral vascular disease, unspecified: Secondary | ICD-10-CM | POA: Diagnosis not present

## 2015-04-30 DIAGNOSIS — R200 Anesthesia of skin: Secondary | ICD-10-CM | POA: Diagnosis not present

## 2015-04-30 DIAGNOSIS — Z87891 Personal history of nicotine dependence: Secondary | ICD-10-CM | POA: Diagnosis not present

## 2015-04-30 LAB — BASIC METABOLIC PANEL
BUN: 12 mg/dL (ref 6–23)
CO2: 27 mEq/L (ref 19–32)
Calcium: 8.8 mg/dL (ref 8.4–10.5)
Chloride: 106 mEq/L (ref 96–112)
Creatinine, Ser: 1.07 mg/dL (ref 0.40–1.20)
GFR: 73.07 mL/min (ref >60.00)
Glucose, Bld: 112 mg/dL — ABNORMAL HIGH (ref 70–99)
Potassium: 3.8 mEq/L (ref 3.5–5.1)
Sodium: 138 mEq/L (ref 135–145)

## 2015-04-30 NOTE — Addendum Note (Signed)
Addended by: Eulis Foster on: 04/30/2015 08:01 AM   Modules accepted: Orders

## 2015-05-02 ENCOUNTER — Other Ambulatory Visit: Payer: Self-pay

## 2015-05-02 ENCOUNTER — Encounter (HOSPITAL_COMMUNITY): Payer: Self-pay | Admitting: Emergency Medicine

## 2015-05-02 ENCOUNTER — Other Ambulatory Visit: Payer: Self-pay | Admitting: Family Medicine

## 2015-05-02 ENCOUNTER — Ambulatory Visit (INDEPENDENT_AMBULATORY_CARE_PROVIDER_SITE_OTHER): Payer: 59 | Admitting: Family Medicine

## 2015-05-02 ENCOUNTER — Emergency Department (HOSPITAL_COMMUNITY)
Admission: EM | Admit: 2015-05-02 | Discharge: 2015-05-02 | Disposition: A | Discharge status: Home / Self Care | Payer: 59 | Attending: Emergency Medicine | Admitting: Emergency Medicine

## 2015-05-02 VITALS — BP 132/86 | HR 66 | Temp 98.1°F | Resp 16 | Ht 63.0 in | Wt 189.6 lb

## 2015-05-02 DIAGNOSIS — Z79899 Other long term (current) drug therapy: Secondary | ICD-10-CM | POA: Insufficient documentation

## 2015-05-02 DIAGNOSIS — R079 Chest pain, unspecified: Secondary | ICD-10-CM | POA: Insufficient documentation

## 2015-05-02 DIAGNOSIS — Z87891 Personal history of nicotine dependence: Secondary | ICD-10-CM | POA: Insufficient documentation

## 2015-05-02 DIAGNOSIS — R7309 Other abnormal glucose: Secondary | ICD-10-CM | POA: Diagnosis not present

## 2015-05-02 DIAGNOSIS — R030 Elevated blood-pressure reading, without diagnosis of hypertension: Secondary | ICD-10-CM | POA: Diagnosis not present

## 2015-05-02 DIAGNOSIS — K088 Other specified disorders of teeth and supporting structures: Secondary | ICD-10-CM | POA: Insufficient documentation

## 2015-05-02 DIAGNOSIS — E876 Hypokalemia: Secondary | ICD-10-CM

## 2015-05-02 DIAGNOSIS — R0989 Other specified symptoms and signs involving the circulatory and respiratory systems: Secondary | ICD-10-CM

## 2015-05-02 DIAGNOSIS — F418 Other specified anxiety disorders: Secondary | ICD-10-CM

## 2015-05-02 DIAGNOSIS — Z880 Allergy status to penicillin: Secondary | ICD-10-CM | POA: Insufficient documentation

## 2015-05-02 DIAGNOSIS — Z86018 Personal history of other benign neoplasm: Secondary | ICD-10-CM | POA: Insufficient documentation

## 2015-05-02 DIAGNOSIS — F410 Panic disorder [episodic paroxysmal anxiety] without agoraphobia: Secondary | ICD-10-CM

## 2015-05-02 DIAGNOSIS — Z793 Long term (current) use of hormonal contraceptives: Secondary | ICD-10-CM | POA: Diagnosis not present

## 2015-05-02 DIAGNOSIS — I10 Essential (primary) hypertension: Secondary | ICD-10-CM | POA: Insufficient documentation

## 2015-05-02 DIAGNOSIS — F419 Anxiety disorder, unspecified: Secondary | ICD-10-CM

## 2015-05-02 DIAGNOSIS — F064 Anxiety disorder due to known physiological condition: Secondary | ICD-10-CM

## 2015-05-02 DIAGNOSIS — I471 Supraventricular tachycardia: Secondary | ICD-10-CM

## 2015-05-02 DIAGNOSIS — K219 Gastro-esophageal reflux disease without esophagitis: Secondary | ICD-10-CM | POA: Insufficient documentation

## 2015-05-02 DIAGNOSIS — R110 Nausea: Secondary | ICD-10-CM | POA: Insufficient documentation

## 2015-05-02 DIAGNOSIS — E119 Type 2 diabetes mellitus without complications: Secondary | ICD-10-CM | POA: Diagnosis not present

## 2015-05-02 DIAGNOSIS — R002 Palpitations: Secondary | ICD-10-CM | POA: Diagnosis present

## 2015-05-02 DIAGNOSIS — R1310 Dysphagia, unspecified: Secondary | ICD-10-CM

## 2015-05-02 DIAGNOSIS — R7303 Prediabetes: Secondary | ICD-10-CM

## 2015-05-02 LAB — BASIC METABOLIC PANEL
Anion gap: 8 (ref 5–15)
BUN: 10 mg/dL (ref 6–20)
CO2: 23 mmol/L (ref 22–32)
Calcium: 8.8 mg/dL — ABNORMAL LOW (ref 8.9–10.3)
Chloride: 107 mmol/L (ref 101–111)
Creatinine, Ser: 1.01 mg/dL — ABNORMAL HIGH (ref 0.44–1.00)
GFR calc Af Amer: >60 mL/min (ref >60)
GFR calc non Af Amer: >60 mL/min (ref >60)
Glucose, Bld: 105 mg/dL — ABNORMAL HIGH (ref 65–99)
Potassium: 3.4 mmol/L — ABNORMAL LOW (ref 3.5–5.1)
Sodium: 138 mmol/L (ref 135–145)

## 2015-05-02 LAB — CBC
HCT: 39.4 % (ref 36.0–46.0)
Hemoglobin: 13.1 g/dL (ref 12.0–15.0)
MCH: 28.7 pg (ref 26.0–34.0)
MCHC: 33.2 g/dL (ref 30.0–36.0)
MCV: 86.4 fL (ref 78.0–100.0)
Platelets: 247 10*3/uL (ref 150–400)
RBC: 4.56 MIL/uL (ref 3.87–5.11)
RDW: 12.8 % (ref 11.5–15.5)
WBC: 10.5 10*3/uL (ref 4.0–10.5)

## 2015-05-02 LAB — I-STAT TROPONIN, ED: Troponin i, poc: 0 ng/mL (ref 0.00–0.08)

## 2015-05-02 LAB — BRAIN NATRIURETIC PEPTIDE: B Natriuretic Peptide: 32.8 pg/mL (ref 0.0–100.0)

## 2015-05-02 LAB — D-DIMER, QUANTITATIVE (NOT AT ARMC): D-Dimer, Quant: 0.28 ug/mL-FEU (ref 0.00–0.48)

## 2015-05-02 NOTE — Progress Notes (Signed)
Subjective:    Patient ID: Brooke Turner, female    DOB: 08-Oct-1976, 39 y.o.   MRN: [id number] Chief Complaint  Patient presents with  . Dizziness  . Shortness of Breath  . Medication Problem  . Weight Loss  . Menstrual Problem    HPI  For the past severdal days pt has had a lot of head pressure and felt off balance as well as diarrhea. Last night in [employer] she was feeling bad - exhausted, overheated - like her blood pressure was to high. Heart was racing a little fast for over 30 min.  BP 196/93, then after several minutes she rechecked it and it was 198/100 and HR was in 70s- low 80s.  She was worried she could have s troke.  She takes the carvedilol twice a day and so went home and took it but her sxs didn't change so she went into the ER.  She didn't want to take a lorazepam while she was driving so didn't try that. For the past few days her systolic has been ok but the diastolic has been in the 09T but HR in the 60s.  Carvedilol still making her fatigued. Work-up in the Er last night was brief and showed no acute cardiac problems, negative d-dimer, EKG showed sinus tach to 111. Cbc, bmp, trop neg. Has been eating and drinking fine. No fever/chills. She has not tried the propranolol 20mg  - worried the dose would be to strong and is very concerned about it overlapping with her carvedilol 3.125 bid. Rash x  1wk  Did start the iron supp but bad about complying with it regulalry - took it yest but prior to that had been periodic - about 2 wks.  Taking once weekly rx vitamin D.  No calcium supp.  Still watching what she is eating. Lots of salads, making sure she gets protein. Sometimes when she is eating or talking she feels something in her neck pulsating. She can feel it when she moves her head.  It is not her carotids - she knows where those are and this is more central.  Has not started zoloft but will consider it.. She had lower extremity arterial US this week since her foot  was falling asleep while she standing at the cardiologist this week - has not had results yet but tech said seemed ok.  Past Medical History  Diagnosis Date  . Hypertension   . GERD (gastroesophageal reflux disease)   . Tachycardia   . Diabetes mellitus without complication   . Fibroid uterus    Past Surgical History  Procedure Laterality Date  . Wisdom tooth extraction     Current Outpatient Prescriptions on File Prior to Visit  Medication Sig Dispense Refill  . carvedilol (COREG) 3.125 MG tablet Take 3.125 mg by mouth 2 (two) times daily with a meal.    . ergocalciferol (VITAMIN D2) 50000 UNITS capsule Take 1 capsule (50,000 Units total) by mouth once a week. 12 capsule 1  . Ferrous Sulfate (IRON) 325 (65 FE) MG TABS Take 1 tablet by mouth daily.    . fluticasone (FLONASE) 50 MCG/ACT nasal spray Place 1 spray into both nostrils daily as needed for allergies or rhinitis. 16 g 12  . hydroxypropyl methylcellulose / hypromellose (ISOPTO TEARS / GONIOVISC) 2.5 % ophthalmic solution Place 1 drop into both eyes 3 (three) times daily as needed for dry eyes.    . IBUPROFEN PO Take by mouth as needed.    Marland Kitchen  LORazepam (ATIVAN) 0.5 MG tablet Take 1 tablet (0.5 mg total) by mouth 2 (two) times daily as needed for anxiety. 30 tablet 1  . Norethindrone-Ethinyl Estradiol-Fe Biphas (LO LOESTRIN FE) 1 MG-10 MCG / 10 MCG tablet Take 1 tablet by mouth daily.    . propranolol (INDERAL) 20 MG tablet Take 1 tablet (20 mg total) by mouth 4 (four) times daily as needed (anxiety, palpitations). 120 tablet 0  . ranitidine (ZANTAC) 150 MG tablet Take 1 tablet (150 mg total) by mouth 2 (two) times daily. 180 tablet 3  . ibuprofen (ADVIL,MOTRIN) 200 MG tablet Take 200 mg by mouth daily as needed for moderate pain.     No current facility-administered medications on file prior to visit.   Allergies  Allergen Reactions  . Penicillins Hives  . Tamiflu [Oseltamivir Phosphate] Nausea And Vomiting   Family History    Problem Relation Age of Onset  . Seizures Mother   . Cancer Father     Liver  . Diabetes Father 13  . Hypertension Father   . Heart disease Father 62    CEA, LE [place]  . Alzheimer's disease Maternal Grandmother   . Heart disease Maternal Grandfather    History   Social History  . Marital Status: Single    Spouse Name: N/A  . Number of Children: 0  . Years of Education: 16   Occupational History  . Customer Service [employer]   Social History Main Topics  . Smoking status: Former Smoker -- 0.25 packs/day for 10 years    Types: Cigarettes    Quit date: 08/19/2014  . Smokeless tobacco: Never Used  . Alcohol Use: 0.0 oz/week    0 Standard drinks or equivalent per week     Comment: Usually 1 time a month  . Drug Use: No  . Sexual Activity:    Partners: Male    Birth Control/ Protection: Other-see comments     Comment: NuvaRing   Other Topics Concern  . None   Social History Narrative   Patient lives at home alone .   Patient works full time at [employer]   Education college.   Right handed.   Caffeine none     Review of Systems  Constitutional: Positive for diaphoresis and unexpected weight change. Negative for fever, chills, activity change and appetite change.  HENT: Positive for dental problem, postnasal drip, sinus pressure and trouble swallowing. Negative for congestion, ear pain, facial swelling, nosebleeds, rhinorrhea and sore throat.   Eyes: Negative for visual disturbance.  Respiratory: Positive for chest tightness and shortness of breath. Negative for cough and wheezing.   Cardiovascular: Positive for chest pain and palpitations. Negative for leg swelling.  Gastrointestinal: Negative for vomiting, diarrhea and constipation.  Genitourinary: Positive for menstrual problem. Negative for decreased urine volume.  Skin: Positive for rash.  Allergic/Immunologic: Negative for immunocompromised state.  Neurological: Positive for facial asymmetry,  weakness, light-headedness and headaches. Negative for dizziness and syncope.  Hematological: Does not bruise/bleed easily.  Psychiatric/Behavioral: Positive for behavioral problems and agitation. Negative for suicidal ideas, sleep disturbance, self-injury and dysphoric mood. The patient is nervous/anxious.        Objective:  BP 132/86 mmHg  Pulse 66  Temp(Src) 98.1 F (36.7 C) (Oral)  Resp 16  Ht 5\' 3"  (1.6 m)  Wt 189 lb 9.6 oz (86.002 kg)  BMI 33.59 kg/m2  SpO2 99%  LMP 05/02/2015 (Exact Date)  Physical Exam  Constitutional: She is oriented to person, place, and time. She  appears well-developed and well-nourished. No distress.  HENT:  Head: Normocephalic and atraumatic.  Right Ear: Tympanic membrane, external ear and ear canal normal. Tympanic membrane is not injected, not erythematous and not retracted. No middle ear effusion.  Left Ear: Tympanic membrane, external ear and ear canal normal. Tympanic membrane is not injected, not erythematous and not retracted.  No middle ear effusion.  Nose: No mucosal edema or rhinorrhea. Right sinus exhibits maxillary sinus tenderness. Left sinus exhibits no maxillary sinus tenderness.  Mouth/Throat: Uvula is midline, oropharynx is clear and moist and mucous membranes are normal.  Eyes: Conjunctivae are normal. No scleral icterus.  Neck: Normal range of motion and full passive range of motion without pain. Neck supple. Normal carotid pulses and no JVD present. Muscular tenderness present. No spinous process tenderness present. No rigidity. No edema and normal range of motion present. No thyroid mass and no thyromegaly present.  Some tenderness along anterior SCM towards area of insertion onto clavicle  Pulmonary/Chest: Effort normal.  Lymphadenopathy:       Head (right side): No submandibular, no tonsillar, no preauricular and no posterior auricular adenopathy present.       Head (left side): No submandibular, no tonsillar, no preauricular and no  posterior auricular adenopathy present.    She has no cervical adenopathy.       Right: No supraclavicular adenopathy present.       Left: No supraclavicular adenopathy present.  Neurological: She is alert and oriented to person, place, and time.  Skin: Skin is warm and dry. She is not diaphoretic. No erythema.  Psychiatric: She has a normal mood and affect. Her behavior is normal.          Assessment & Plan:   1. Atrial tachycardia, paroxysmal - pt still has not tried propranolol - has it with her - last took carvedilol yest evening - pt VERY worried about accidental overdose or med interaction but instructed to start propranolol IR 20mg  bid - anticipate need to increase to qid - once she finds a good level of control she will let me know and I can transition her to LA propranolol - carvedilol is working all right but still having breakthrough sxs and having fatigue.  2. Labile blood pressure - has nml plasma metanephrines 6 mos prior so unlikely but will recheck for pheo as pt continues to have BP and tachycardia spikes accompanied by sweating, HA, tremor, flushing very randomly - pt still insists not due to anxiety.  3. Difficulty swallowing - nml barium swallow but will check soft tissue US of neck as pt is concerned about a mass though none appreciated on exam  4. Hypokalemia - increase K in diet - VERY mild  5. Hypocalcemia - VERY mild - 8.7 - likely due to vit D def, increase protein in diet and cons daily calcium supp  6. Anxiety about health   7. Anxiety disorder due to general medical condition with panic attack   8. Essential hypertension   9. Prediabetes - has been improving w/ tlc and weight loss - a1c was 6.0 6 wks prev.  10.    Vitamin D deficiency - taking once weekly high dose. vit D was 22 6 wks ago. 11.    Anxiety - [facility] - difficult due to work schedule, was advised to start zoloft which she has not but is willing.  Orders Placed This  Encounter  Procedures  . US Soft Tissue Head/Neck  Standing Status: Future     Number of Occurrences:      Standing Expiration Date: 07/01/2016    Order Specific Question:  Reason for Exam (SYMPTOM  OR DIAGNOSIS REQUIRED)    Answer:  trouble swallowing, promenint tissues - eval lymphnodes vs goiter    Order Specific Question:  Preferred imaging location?    Answer:  External  . Protein electrophoresis, serum  . Catecholamines, fractionated, urine, 24 hour    Standing Status: Future     Number of Occurrences:      Standing Expiration Date: 05/01/2016  . Metanephrines, urine, 24 hour    Standing Status: Future     Number of Occurrences:      Standing Expiration Date: 05/01/2016  . Comprehensive metabolic panel  . Calcium, ionized  . High sensitivity CRP   Over 40 min spent in face-to-face evaluation of and consultation with patient and coordination of care.  Over 50% of this time was spent counseling this patient.    [name], MD MPH

## 2015-05-02 NOTE — Discharge Instructions (Signed)
CONTINUE YOUR MEDICATIONS AS PRESCRIBED. FOLLOW UP WITH YOUR DOCTOR FOR RECHECK NEXT WEEK.   Generalized Anxiety Disorder Generalized anxiety disorder (GAD) is a mental disorder. It interferes with life functions, including relationships, work, and school. GAD is different from normal anxiety, which everyone experiences at some point in their lives in response to specific life events and activities. Normal anxiety actually helps [place] prepare for and get through these life events and activities. Normal anxiety goes away after the event or activity is over.  GAD causes anxiety that is not necessarily related to specific events or activities. It also causes excess anxiety in proportion to specific events or activities. The anxiety associated with GAD is also difficult to control. GAD can vary from mild to severe. People with severe GAD can have intense waves of anxiety with physical symptoms (panic attacks).  SYMPTOMS The anxiety and worry associated with GAD are difficult to control. This anxiety and worry are related to many life events and activities and also occur more days than not for 6 months or longer. People with GAD also have three or more of the following symptoms (one or more in children):  Restlessness.   Fatigue.  Difficulty concentrating.   Irritability.  Muscle tension.  Difficulty sleeping or unsatisfying sleep. DIAGNOSIS GAD is diagnosed through an assessment by your health care provider. Your health care provider will ask you questions aboutyour mood,physical symptoms, and events in your life. Your health care provider may ask you about your medical history and use of alcohol or drugs, including prescription medicines. Your health care provider may also do a physical exam and blood tests. Certain medical conditions and the use of certain substances can cause symptoms similar to those associated with GAD. Your health care provider may refer you to a mental health specialist for  further evaluation. TREATMENT The following therapies are usually used to treat GAD:   Medication. Antidepressant medication usually is prescribed for long-term daily control. Antianxiety medicines may be added in severe cases, especially when panic attacks occur.   Talk therapy (psychotherapy). Certain types of talk therapy can be helpful in treating GAD by providing support, education, and guidance. A form of talk therapy called cognitive behavioral therapy can teach you healthy ways to think about and react to daily life events and activities.  Stress managementtechniques. These include yoga, meditation, and exercise and can be very helpful when they are practiced regularly. A mental health specialist can help determine which treatment is best for you. Some people see improvement with one therapy. However, other people require a combination of therapies. Document Released: 02/18/2013 Document Revised: 03/10/2014 Document Reviewed: 02/18/2013 [facility] Patient Information 2015 [place], [place]. This information is not intended to replace advice given to you by your health care provider. Make sure you discuss any questions you have with your health care provider. Palpitations A palpitation is the feeling that your heartbeat is irregular or is faster than normal. It may feel like your heart is fluttering or skipping a beat. Palpitations are usually not a serious problem. However, in some cases, you may need further medical evaluation. CAUSES  Palpitations can be caused by:  Smoking.  Caffeine or other stimulants, such as diet pills or energy drinks.  Alcohol.  Stress and anxiety.  Strenuous physical activity.  Fatigue.  Certain medicines.  Heart disease, especially if you have a history of irregular heart rhythms (arrhythmias), such as atrial fibrillation, atrial flutter, or supraventricular tachycardia.  An improperly working pacemaker or defibrillator. DIAGNOSIS  To  find the  cause of your palpitations, your health care provider will take your medical history and perform a physical exam. Your health care provider may also have you take a test called an ambulatory electrocardiogram (ECG). An ECG records your heartbeat patterns over a 24-hour period. You may also have other tests, such as:  Transthoracic echocardiogram (TTE). During echocardiography, sound waves are used to evaluate how blood flows through your heart.  Transesophageal echocardiogram (TEE).  Cardiac monitoring. This allows your health care provider to monitor your heart rate and rhythm in real time.  Holter monitor. This is a portable device that records your heartbeat and can help diagnose heart arrhythmias. It allows your health care provider to track your heart activity for several days, if needed.  Stress tests by exercise or by giving medicine that makes the heart beat faster. TREATMENT  Treatment of palpitations depends on the cause of your symptoms and can vary greatly. Most cases of palpitations do not require any treatment other than time, relaxation, and monitoring your symptoms. Other causes, such as atrial fibrillation, atrial flutter, or supraventricular tachycardia, usually require further treatment. HOME CARE INSTRUCTIONS   Avoid:  Caffeinated coffee, tea, soft drinks, diet pills, and energy drinks.  Chocolate.  Alcohol.  Stop smoking if you smoke.  Reduce your stress and anxiety. Things that can help you relax include:  A method of controlling things in your body, such as your heartbeats, with your mind (biofeedback).  Yoga.  Meditation.  Physical activity such as swimming, jogging, or walking.  Get plenty of rest and sleep. SEEK MEDICAL CARE IF:   You continue to have a fast or irregular heartbeat beyond 24 hours.  Your palpitations occur more often. SEEK IMMEDIATE MEDICAL CARE IF:  You have chest pain or shortness of breath.  You have a severe headache.  You  feel dizzy or you faint. MAKE SURE YOU:  Understand these instructions.  Will watch your condition.  Will get help right away if you are not doing well or get worse. Document Released: 10/21/2000 Document Revised: 10/29/2013 Document Reviewed: 12/23/2011 [facility] Patient Information 2015 [place], [place]. This information is not intended to replace advice given to you by your health care provider. Make sure you discuss any questions you have with your health care provider.

## 2015-05-02 NOTE — Patient Instructions (Addendum)
High Protein Diet A high protein diet means that high protein foods are added to your diet. Getting more protein in the diet is important for a number of reasons. Protein helps the body to build tissue, muscle, and to repair damage. People who have had surgery, injuries such as broken bones, infections, and burns, or illnesses such as cancer, may need more protein in their diet.  SERVING SIZES Measuring foods and serving sizes helps to make sure you are getting the right amount of food. The list below tells how big or small some common serving sizes are.   1 oz.........4 stacked dice.  3 oz........Marland KitchenDeck of cards.  1 tsp.......Marland KitchenTip of little finger.  1 tbs......Marland KitchenMarland KitchenThumb.  2 tbs.......Marland KitchenGolf ball.   cup......Marland KitchenHalf of a fist.  1 cup.......Marland KitchenA fist. FOOD SOURCES OF PROTEIN Listed below are some food sources of protein and the amount of protein they contain. Your Registered Dietitian can calculate how many grams of protein you need for your medical condition. High protein foods can be added to the diet at mealtime or as snacks. Be sure to have at least 1 protein-containing food at each meal and snack to ensure adequate intake.  Meats and Meat Substitutes / Protein (g)  3 oz poultry (chicken, [place]) / 26 g  3 oz tuna, canned in water / 26 g  3 oz fish (cod) / 21 g  3 oz red meat (beef, pork) / 21 g  4 oz tofu / 9 g  1 egg / 6 g   cup egg substitute / 5 g  1 cup dried beans / 15 g  1 cup soy milk / 4 g Dairy / Protein (g)  1 cup milk (skim, 1%, 2%, whole) / 8 g   cup evaporated milk / 9 g  1 cup buttermilk / 8 g  1 cup low-fat plain yogurt / 11 g  1 cup regular plain yogurt / 9 g   cup cottage cheese / 14 g  1 oz cheddar cheese / 7 g Nuts / Protein (g)  2 tbs peanut butter / 8 g  1 oz peanuts / 7 g  2 tbs cashews / 5 g  2 tbs almonds / 5 g Document Released: 10/24/2005 Document Revised: 01/16/2012 Document Reviewed: 07/27/2007 [employer] Patient Information  2015 [place], [place]. This information is not intended to replace advice given to you by your health care provider. Make sure you discuss any questions you have with your health care provider. Potassium Content of Foods Potassium is a mineral found in many foods and drinks. It helps keep fluids and minerals balanced in your body and affects how steadily your heart beats. Potassium also helps control your blood pressure and keep your muscles and nervous system healthy. Certain health conditions and medicines may change the balance of potassium in your body. When this happens, you can help balance your level of potassium through the foods that you do or do not eat. Your health care provider or dietitian may recommend an amount of potassium that you should have each day. The following lists of foods provide the amount of potassium (in parentheses) per serving in each item. HIGH IN POTASSIUM  The following foods and beverages have 200 mg or more of potassium per serving:  Apricots, 2 raw or 5 dry (200 mg).  Artichoke, 1 medium (345 mg).  Avocado, raw,  each (245 mg).  Banana, 1 medium (425 mg).  Beans, lima, or baked beans, canned,  cup (280 mg).  Beans,  white, canned,  cup (595 mg).  Beef roast, 3 oz (320 mg).  Beef, ground, 3 oz (270 mg).  Beets, raw or cooked,  cup (260 mg).  Bran muffin, 2 oz (300 mg).  Broccoli,  cup (230 mg).  Brussels sprouts,  cup (250 mg).  Cantaloupe,  cup (215 mg).  Cereal, 100% bran,  cup (200-400 mg).  Cheeseburger, single, fast food, 1 each (225-400 mg).  Chicken, 3 oz (220 mg).  Clams, canned, 3 oz (535 mg).  Crab, 3 oz (225 mg).  Dates, 5 each (270 mg).  Dried beans and peas,  cup (300-475 mg).  Figs, dried, 2 each (260 mg).  Fish: halibut, tuna, cod, snapper, 3 oz (480 mg).  Fish: salmon, haddock, swordfish, perch, 3 oz (300 mg).  Fish, tuna, canned 3 oz (200 mg).  [place] fries, fast food, 3 oz (470 mg).  Granola with  fruit and nuts,  cup (200 mg).  Grapefruit juice,  cup (200 mg).  Greens, beet,  cup (655 mg).  Honeydew melon,  cup (200 mg).  Kale, raw, 1 cup (300 mg).  Kiwi, 1 medium (240 mg).  Kohlrabi, rutabaga, parsnips,  cup (280 mg).  Lentils,  cup (365 mg).  Mango, 1 each (325 mg).  Milk, chocolate, 1 cup (420 mg).  Milk: nonfat, low-fat, whole, buttermilk, 1 cup (350-380 mg).  Molasses, 1 Tbsp (295 mg).  Mushrooms,  cup (280) mg.  Nectarine, 1 each (275 mg).  Nuts: almonds, peanuts, hazelnuts, [place], cashew, mixed, 1 oz (200 mg).  Nuts, pistachios, 1 oz (295 mg).  Orange, 1 each (240 mg).  Orange juice,  cup (235 mg).  Papaya, medium,  fruit (390 mg).  Peanut butter, chunky, 2 Tbsp (240 mg).  Peanut butter, smooth, 2 Tbsp (210 mg).  Pear, 1 medium (200 mg).  Pomegranate, 1 whole (400 mg).  Pomegranate juice,  cup (215 mg).  Pork, 3 oz (350 mg).  Potato chips, salted, 1 oz (465 mg).  Potato, baked with skin, 1 medium (925 mg).  Potatoes, boiled,  cup (255 mg).  Potatoes, mashed,  cup (330 mg).  Prune juice,  cup (370 mg).  Prunes, 5 each (305 mg).  Pudding, chocolate,  cup (230 mg).  Pumpkin, canned,  cup (250 mg).  Raisins, seedless,  cup (270 mg).  Seeds, sunflower or pumpkin, 1 oz (240 mg).  Soy milk, 1 cup (300 mg).  Spinach,  cup (420 mg).  Spinach, canned,  cup (370 mg).  Sweet potato, baked with skin, 1 medium (450 mg).  Swiss chard,  cup (480 mg).  Tomato or vegetable juice,  cup (275 mg).  Tomato sauce or puree,  cup (400-550 mg).  Tomato, raw, 1 medium (290 mg).  Tomatoes, canned,  cup (200-300 mg).  [place], 3 oz (250 mg).  Wheat germ, 1 oz (250 mg).  Winter squash,  cup (250 mg).  Yogurt, plain or fruited, 6 oz (260-435 mg).  Zucchini,  cup (220 mg). MODERATE IN POTASSIUM The following foods and beverages have 50-200 mg of potassium per serving:  Apple, 1 each (150 mg).  Apple juice,   cup (150 mg).  Applesauce,  cup (90 mg).  Apricot nectar,  cup (140 mg).  Asparagus, small spears,  cup or 6 spears (155 mg).  Bagel, cinnamon raisin, 1 each (130 mg).  Bagel, egg or plain, 4 in., 1 each (70 mg).  Beans, green,  cup (90 mg).  Beans, yellow,  cup (190 mg).  Beer, regular, 12 oz (100  mg).  Beets, canned,  cup (125 mg).  Blackberries,  cup (115 mg).  Blueberries,  cup (60 mg).  Bread, whole wheat, 1 slice (70 mg).  Broccoli, raw,  cup (145 mg).  Cabbage,  cup (150 mg).  Carrots, cooked or raw,  cup (180 mg).  Cauliflower, raw,  cup (150 mg).  Celery, raw,  cup (155 mg).  Cereal, bran flakes, cup (120-150 mg).  Cheese, cottage,  cup (110 mg).  Cherries, 10 each (150 mg).  Chocolate, 1 oz bar (165 mg).  Coffee, brewed 6 oz (90 mg).  Corn,  cup or 1 ear (195 mg).  Cucumbers,  cup (80 mg).  Egg, large, 1 each (60 mg).  Eggplant,  cup (60 mg).  Endive, raw, cup (80 mg).  English muffin, 1 each (65 mg).  Fish, orange roughy, 3 oz (150 mg).  Frankfurter, beef or pork, 1 each (75 mg).  Fruit cocktail,  cup (115 mg).  Grape juice,  cup (170 mg).  Grapefruit,  fruit (175 mg).  Grapes,  cup (155 mg).  Greens: kale, turnip, collard,  cup (110-150 mg).  Ice cream or frozen yogurt, chocolate,  cup (175 mg).  Ice cream or frozen yogurt, vanilla,  cup (120-150 mg).  Lemons, limes, 1 each (80 mg).  Lettuce, all types, 1 cup (100 mg).  Mixed vegetables,  cup (150 mg).  Mushrooms, raw,  cup (110 mg).  Nuts: walnuts, pecans, or macadamia, 1 oz (125 mg).  Oatmeal,  cup (80 mg).  Okra,  cup (110 mg).  Onions, raw,  cup (120 mg).  Peach, 1 each (185 mg).  Peaches, canned,  cup (120 mg).  Pears, canned,  cup (120 mg).  Peas, green, frozen,  cup (90 mg).  Peppers, green,  cup (130 mg).  Peppers, red,  cup (160 mg).  Pineapple juice,  cup (165 mg).  Pineapple, fresh or canned,  cup  (100 mg).  Plums, 1 each (105 mg).  Pudding, vanilla,  cup (150 mg).  Raspberries,  cup (90 mg).  Rhubarb,  cup (115 mg).  Rice, wild,  cup (80 mg).  Shrimp, 3 oz (155 mg).  Spinach, raw, 1 cup (170 mg).  Strawberries,  cup (125 mg).  Summer squash  cup (175-200 mg).  Swiss chard, raw, 1 cup (135 mg).  Tangerines, 1 each (140 mg).  Tea, brewed, 6 oz (65 mg).  Turnips,  cup (140 mg).  Watermelon,  cup (85 mg).  Wine, red, table, 5 oz (180 mg).  Wine, white, table, 5 oz (100 mg). LOW IN POTASSIUM The following foods and beverages have less than 50 mg of potassium per serving.  Bread, white, 1 slice (30 mg).  Carbonated beverages, 12 oz (less than 5 mg).  Cheese, 1 oz (20-30 mg).  Cranberries,  cup (45 mg).  Cranberry juice cocktail,  cup (20 mg).  Fats and oils, 1 Tbsp (less than 5 mg).  Hummus, 1 Tbsp (32 mg).  Nectar: papaya, mango, or pear,  cup (35 mg).  Rice, white or brown,  cup (50 mg).  Spaghetti or macaroni,  cup cooked (30 mg).  Tortilla, flour or corn, 1 each (50 mg).  Waffle, 4 in., 1 each (50 mg).  Water chestnuts,  cup (40 mg). Document Released: 06/07/2005 Document Revised: 10/29/2013 Document Reviewed: 09/20/2013 [facility] Patient Information 2015 [place], [place]. This information is not intended to replace advice given to you by your health care provider. Make sure you discuss any questions you have with your health  care provider. Iron-Rich Diet An iron-rich diet contains foods that are good sources of iron. Iron is an important mineral that helps your body produce hemoglobin. Hemoglobin is a protein in red blood cells that carries oxygen to the body's tissues. Sometimes, the iron level in your blood can be low. This may be caused by:  A lack of iron in your diet.  Blood loss.  Times of growth, such as during pregnancy or during a child's growth and development. Low levels of iron can cause a decrease in the number of  red blood cells. This can result in iron deficiency anemia. Iron deficiency anemia symptoms include:  Tiredness.  Weakness.  Irritability.  Increased chance of infection. Here are some recommendations for daily iron intake:  Males older than 40 years of age need 8 mg of iron per day.  Women ages 97 to 10 need 18 mg of iron per day.  Pregnant women need 27 mg of iron per day, and women who are over 31 years of age and breastfeeding need 9 mg of iron per day.  Women over the age of 53 need 8 mg of iron per day. SOURCES OF IRON There are 2 types of iron that are found in food: heme iron and nonheme iron. Heme iron is absorbed by the body better than nonheme iron. Heme iron is found in meat, poultry, and fish. Nonheme iron is found in grains, beans, and vegetables. Heme Iron Sources Food / Iron (mg)  Chicken liver, 3 oz (85 g)/ 10 mg  Beef liver, 3 oz (85 g)/ 5.5 mg  Oysters, 3 oz (85 g)/ 8 mg  Beef, 3 oz (85 g)/ 2 to 3 mg  Shrimp, 3 oz (85 g)/ 2.8 mg  [place], 3 oz (85 g)/ 2 mg  Chicken, 3 oz (85 g) / 1 mg  Fish (tuna, halibut), 3 oz (85 g)/ 1 mg  Pork, 3 oz (85 g)/ 0.9 mg Nonheme Iron Sources Food / Iron (mg)  Ready-to-eat breakfast cereal, iron-fortified / 3.9 to 7 mg  Tofu,  cup / 3.4 mg  Kidney beans,  cup / 2.6 mg  Baked potato with skin / 2.7 mg  Asparagus,  cup / 2.2 mg  Avocado / 2 mg  Dried peaches,  cup / 1.6 mg  Raisins,  cup / 1.5 mg  Soy milk, 1 cup / 1.5 mg  Whole-wheat bread, 1 slice / 1.2 mg  Spinach, 1 cup / 0.8 mg  Broccoli,  cup / 0.6 mg IRON ABSORPTION Certain foods can decrease the body's absorption of iron. Try to avoid these foods and beverages while eating meals with iron-containing foods:  Coffee.  Tea.  Fiber.  Soy. Foods containing vitamin C can help increase the amount of iron your body absorbs from iron sources, especially from nonheme sources. Eat foods with vitamin C along with iron-containing foods to  increase your iron absorption. Foods that are high in vitamin C include many fruits and vegetables. Some good sources are:  Fresh orange juice.  Oranges.  Strawberries.  Mangoes.  Grapefruit.  Red bell peppers.  Green bell peppers.  Broccoli.  Potatoes with skin.  Tomato juice. Document Released: 06/07/2005 Document Revised: 01/16/2012 Document Reviewed: 04/14/2011 [facility] Patient Information 2015 [place], [place]. This information is not intended to replace advice given to you by your health care provider. Make sure you discuss any questions you have with your health care provider.

## 2015-05-02 NOTE — ED Notes (Addendum)
Patient reports palpitations and indigestion. Takes Carvedilol 6.25 mg total per day (has been on low dose for a month). Was taking a higher dose of Carvedilol for palpitations and blood pressure. Took last dose (3.125 mg) around 2330. Reports slight shortness of breath, left sided chest pain described as "tightness."  Also reports left arm and hand numbness/tingling. Is in physical therapy for back pain with no etiology. Has had nausea recently (last two days) and headaches (not baseline for patient). Denies hx strokes/TIA. Neurologically intact. RR even/unlabored. Speaking full/clear sentences. No other c/c. Recently started oral contraceptives approximately a month ago. Had a Doppler done of bilateral legs and abdominal area recently-does not have results (had this done because "foot falls asleep when I stand.") Has Ativan prescription but has not taken it recently (prescribed for panic attacks).

## 2015-05-02 NOTE — ED Provider Notes (Signed)
CSN: [id number]     Arrival date & time 05/02/15  0001 History   First MD Initiated Contact with Patient 05/02/15 0123     Chief Complaint  Patient presents with  . Palpitations  . Chest Pain  . Shortness of Breath  . Dry mouth   . Dizziness  . Dental Pain     (Consider location/radiation/quality/duration/timing/severity/associated sxs/prior Treatment) Patient is a 39 y.o. female presenting with palpitations, chest pain, shortness of breath, dizziness, and tooth pain. The history is provided by the patient. No language interpreter was used.  Palpitations Associated symptoms: chest pain, dizziness, nausea and shortness of breath   Associated symptoms: no vomiting and no weakness   Associated symptoms comment:  She returns to the emergency department with recurrent symptoms of both "skipping heart beats" and heart racing earlier today. She has been seen by cardiology (last week) and PCP who is changing her medication from carvedilol transitioning to propranolol. No syncope. She reports chest tightness, SOB and nausea without vomiting. She is concerned she is having blood clots in her lungs or that something is wrong with her heart.  Chest Pain Associated symptoms: dizziness, nausea, palpitations and shortness of breath   Associated symptoms: no abdominal pain, no fever, not vomiting and no weakness   Shortness of Breath Associated symptoms: chest pain   Associated symptoms: no abdominal pain, no fever and no vomiting   Dizziness Associated symptoms: chest pain, nausea, palpitations and shortness of breath   Associated symptoms: no vomiting and no weakness   Dental Pain Associated symptoms: no fever     Past Medical History  Diagnosis Date  . Hypertension   . GERD (gastroesophageal reflux disease)   . Tachycardia   . Diabetes mellitus without complication   . Fibroid uterus    Past Surgical History  Procedure Laterality Date  . Wisdom tooth extraction     Family History   Problem Relation Age of Onset  . Seizures Mother   . Cancer Father     Liver  . Diabetes Father 38  . Hypertension Father   . Heart disease Father 31    CEA, LE Stenting  . Alzheimer's disease Maternal Grandmother   . Heart disease Maternal Grandfather    History  Substance Use Topics  . Smoking status: Former Smoker -- 0.25 packs/day for 10 years    Types: Cigarettes    Quit date: 08/19/2014  . Smokeless tobacco: Never Used  . Alcohol Use: 0.0 oz/week    0 Standard drinks or equivalent per week     Comment: Usually 1 time a month   OB History    Gravida Para Term Preterm AB TAB SAB Ectopic Multiple Living   2 0 0 0 2 1 1 0 0 0      Review of Systems  Constitutional: Negative for fever and chills.  Respiratory: Positive for shortness of breath.   Cardiovascular: Positive for chest pain and palpitations.  Gastrointestinal: Positive for nausea. Negative for vomiting and abdominal pain.  Musculoskeletal: Negative.   Skin: Negative.   Neurological: Positive for dizziness. Negative for syncope and weakness.      Allergies  Penicillins and Tamiflu  Home Medications   Prior to Admission medications   Medication Sig Start Date End Date Taking? Authorizing Provider  carvedilol (COREG) 3.125 MG tablet Take 3.125 mg by mouth 2 (two) times daily with a meal.   Yes Historical Provider, MD  Ferrous Sulfate (IRON) 325 (65 FE) MG TABS Take 1 tablet by  mouth daily.   Yes Historical Provider, MD  hydroxypropyl methylcellulose / hypromellose (ISOPTO TEARS / GONIOVISC) 2.5 % ophthalmic solution Place 1 drop into both eyes 3 (three) times daily as needed for dry eyes.   Yes Historical Provider, MD  ibuprofen (ADVIL,MOTRIN) 200 MG tablet Take 200 mg by mouth daily as needed for moderate pain.   Yes Historical Provider, MD  Norethindrone-Ethinyl Estradiol-Fe Biphas (LO LOESTRIN FE) 1 MG-10 MCG / 10 MCG tablet Take 1 tablet by mouth daily.   Yes Historical Provider, MD  ranitidine (ZANTAC)  150 MG tablet Take 1 tablet (150 mg total) by mouth 2 (two) times daily. 01/19/15  Yes [name], MD  ergocalciferol (VITAMIN D2) 50000 UNITS capsule Take 1 capsule (50,000 Units total) by mouth once a week. 03/12/15   [name], MD  fluticasone (FLONASE) 50 MCG/ACT nasal spray Place 1 spray into both nostrils daily as needed for allergies or rhinitis. 10/15/14   Chelle Jeffery, PA-C  IBUPROFEN PO Take by mouth as needed.    Historical Provider, MD  LORazepam (ATIVAN) 0.5 MG tablet Take 1 tablet (0.5 mg total) by mouth 2 (two) times daily as needed for anxiety. 12/15/14   [name], MD  propranolol (INDERAL) 20 MG tablet Take 1 tablet (20 mg total) by mouth 4 (four) times daily as needed (anxiety, palpitations). 03/03/15   [name], MD   BP 143/78 mmHg  Pulse 63  Temp(Src) 98.1 F (36.7 C) (Oral)  Resp 8  SpO2 97%  LMP 05/02/2015 (Exact Date) Physical Exam  Constitutional: She is oriented to person, place, and time. She appears well-developed and well-nourished.  HENT:  Head: Normocephalic.  Neck: Normal range of motion. Neck supple.  Cardiovascular: Normal rate and regular rhythm.   No murmur heard. No carotid bruit  Pulmonary/Chest: Effort normal and breath sounds normal. She has no wheezes. She has no rales.  Abdominal: Soft. Bowel sounds are normal. There is no tenderness. There is no rebound and no guarding.  Musculoskeletal: Normal range of motion.  Neurological: She is alert and oriented to person, place, and time.  Skin: Skin is warm and dry. No rash noted.  Psychiatric: She has a normal mood and affect.    ED Course  Procedures (including critical care time) Labs Review Labs Reviewed  BASIC METABOLIC PANEL - Abnormal; Notable for the following:    Potassium 3.4 (*)    Glucose, Bld 105 (*)    Creatinine, Ser 1.01 (*)    Calcium 8.8 (*)    All other components within normal limits  CBC  BRAIN NATRIURETIC PEPTIDE  D-DIMER, QUANTITATIVE (NOT AT [facility])  I-STAT TROPOININ,  ED    Imaging Review No results found.   EKG Interpretation   Date/Time:  Saturday May 02 2015 00:14:14 EDT Ventricular Rate:  111 PR Interval:  161 QRS Duration: 91 QT Interval:  350 QTC Calculation: 476 R Axis:   15 Text Interpretation:  Sinus tachycardia Biatrial enlargement Low voltage,  precordial leads No significant change since last tracing Confirmed by  OTTER  MD, OLGA ([id number]) on 05/02/2015 1:31:21 AM      MDM   Final diagnoses:  None    1. Palpitation 2. Anxiety  The patient's symptoms are recurrent, however, she is at risk for PE given oral birth control and family history. She was tachycardic on arrival (EKG 111). D-dimer ordered despite negative test in February and negative CT Angio in 7/15.   D-dimer negative. The patient's lab  studies are unremarkable for acute finding. She is stable for discharge home and follow up with her primary care physician for further management.      Charlann Lange, PA-C 05/02/15 [id number]  Linton Flemings, MD 05/02/15 [phone]

## 2015-05-03 LAB — COMPREHENSIVE METABOLIC PANEL
ALT: 13 U/L (ref 0–35)
AST: 14 U/L (ref 0–37)
Albumin: 4.2 g/dL (ref 3.5–5.2)
Alkaline Phosphatase: 48 U/L (ref 39–117)
BUN: 10 mg/dL (ref 6–23)
CO2: 28 mEq/L (ref 19–32)
Calcium: 9.2 mg/dL (ref 8.4–10.5)
Chloride: 103 mEq/L (ref 96–112)
Creat: 1.05 mg/dL (ref 0.50–1.10)
Glucose, Bld: 99 mg/dL (ref 70–99)
Potassium: 4.1 mEq/L (ref 3.5–5.3)
Sodium: 140 mEq/L (ref 135–145)
Total Bilirubin: 0.8 mg/dL (ref 0.2–1.2)
Total Protein: 7.1 g/dL (ref 6.0–8.3)

## 2015-05-03 LAB — CALCIUM, IONIZED: Calcium, Ion: 1.23 mmol/L (ref 1.12–1.32)

## 2015-05-04 ENCOUNTER — Telehealth: Payer: Self-pay | Admitting: Cardiology

## 2015-05-04 NOTE — Telephone Encounter (Signed)
Left message to call back Labs from 04/30/15   Labs show normal K and renal function. Follow up appointment 06/29/15 at 8:30

## 2015-05-04 NOTE — Telephone Encounter (Signed)
New Message  Pt returning Rn phone call concerning test restulst and f/u appt; Please call back and discuss.

## 2015-05-05 LAB — PROTEIN ELECTROPHORESIS, SERUM
Albumin ELP: 4.1 g/dL (ref 3.8–4.8)
Alpha-1-Globulin: 0.3 g/dL (ref 0.2–0.3)
Alpha-2-Globulin: 0.8 g/dL (ref 0.5–0.9)
Beta 2: 0.4 g/dL (ref 0.2–0.5)
Beta Globulin: 0.8 g/dL — ABNORMAL HIGH (ref 0.4–0.6)
Gamma Globulin: 1.1 g/dL (ref 0.8–1.7)
Total Protein, Serum Electrophoresis: 7.6 g/dL (ref 6.1–8.1)

## 2015-05-05 LAB — HIGH SENSITIVITY CRP: CRP, High Sensitivity: 6.8 mg/L — ABNORMAL HIGH

## 2015-05-07 NOTE — Telephone Encounter (Signed)
Returned call to patient no answer.LMTC. 

## 2015-05-07 NOTE — Telephone Encounter (Signed)
Received a call back from patient she stated PCP stopped coreg last Sat 05/02/15 due to low pulse.Stated she started her on propranolol 10 mg one day alternating with 20 mg next day.Stated her pulse is still low ranging in mid 50's.Stated she is light headed and weak.Stated PCP prescribed propranolol for palpitations and anxiety.PCP advised her to call Dr.Hochrein about low pulse.She has appointment with Dr.Hochrein 06/29/15 would like to be seen sooner.Advised will send message to Dr.Hochrein for advice.Dr.Hohrein's schedule is full.Extender's schedules are full.

## 2015-05-07 NOTE — Telephone Encounter (Signed)
Returning Call . Have a question about her heart rate . Her heart has been low in the 50"s. Please call    Thanks

## 2015-05-08 NOTE — Telephone Encounter (Signed)
Spoke to patient. Had long discussion, mostly revolved around reassurance concerning her reportedly low HR. She states symptoms, states HR seems to drop low (rate in 50s) for a few minutes at a time before resuming ~70-80 bpm She notes fatigue when this happens. States can happen several times a day but sometimes not at all - very sporadic.  Apparently, PCP had advised Propanolol use for anxiety/palpitations - however, she thinks this is inducing the low HR - i advised this is possible, do not use for anxiety. She has ativan PRN for this purpose, advised to take the ativan for instructed purpose.   She was noticeably anxious on the phone, seems to have developed a fear that her heart is going to stop in the middle of the night while she is asleep.  Requested holter monitor - she has had 2 in the past which did not apparently show anything. Gave reassurance, informed her Dr. Percival Spanish would be able to make recommendation and we would give her instruction next week. Pt informed that on-call provider line available over weekend for worsening symptoms.

## 2015-05-08 NOTE — Telephone Encounter (Signed)
Received a call from patient.She stated she is still concerned about slow heart beat.Advised to take propranolol 10 mg daily if needed for palpitations.Stated she did not take propranolol yesterday and pulse at present 62 with activity.Stated at rest pulse 58.Stated she is worried that pulse will be even lower during sleep.Patient reassured.Stated PCP prescribed Zoloft, but she has not started taking she is afraid will lower heart rate.Advised Zoloft will not lower heart rate and ok to start taking.Stated she wanted Dr.Hochrein's advice.Message sent to [place].

## 2015-05-08 NOTE — Telephone Encounter (Signed)
Pt called in stating that he heart rate has dropped below 50, she says that it is in the high 40's. She would like to know what to do, if she should go to the emergency room or come in have a holter monitor placed. Please call back  Thanks

## 2015-05-08 NOTE — Telephone Encounter (Signed)
Line busy when dialed. 

## 2015-05-08 NOTE — Telephone Encounter (Signed)
I agree that she should not be taking the propranolol.

## 2015-05-12 ENCOUNTER — Encounter: Payer: Self-pay | Admitting: Cardiovascular Disease

## 2015-05-12 ENCOUNTER — Telehealth: Payer: Self-pay | Admitting: *Deleted

## 2015-05-12 LAB — IMMUNOFIXATION ELECTROPHORESIS
IgA: 234 mg/dL (ref 69–380)
IgG (Immunoglobin G), Serum: 1360 mg/dL (ref 690–1700)
IgM, Serum: 114 mg/dL (ref 52–322)
Total Protein, Serum Electrophoresis: 7.4 g/dL (ref 6.0–8.3)

## 2015-05-12 NOTE — Telephone Encounter (Signed)
Again, called, left message to call. If she calls back, just needs to be advised that Dr. Percival Spanish agreed w/ last week's recommendation - does not want her taking propanolol.

## 2015-05-12 NOTE — Telephone Encounter (Signed)
-----   Message from Consuelo Pandy, [place] sent at 05/01/2015  1:10 PM EDT ----- Renal function and K both  normal.

## 2015-05-12 NOTE — Telephone Encounter (Signed)
Returning your call Ovid Curd .Marland Kitchen Please call

## 2015-05-12 NOTE — Telephone Encounter (Signed)
[id number]

## 2015-05-13 NOTE — Telephone Encounter (Signed)
Received message - patient stopped propranolol over weekend and is now feeling better.  Called back, received pt voicemail - left message informing that advice for propanolol discontinuation stands under Dr. Rosezella [place] advisement - also instructed to contact PCP for suitable alternative.

## 2015-05-15 ENCOUNTER — Other Ambulatory Visit: Payer: 59

## 2015-05-17 ENCOUNTER — Emergency Department (HOSPITAL_COMMUNITY)
Admission: EM | Admit: 2015-05-17 | Discharge: 2015-05-17 | Disposition: A | Discharge status: Home / Self Care | Payer: 59 | Attending: Emergency Medicine | Admitting: Emergency Medicine

## 2015-05-17 ENCOUNTER — Emergency Department (HOSPITAL_COMMUNITY): Payer: 59

## 2015-05-17 ENCOUNTER — Encounter (HOSPITAL_COMMUNITY): Payer: Self-pay | Admitting: *Deleted

## 2015-05-17 DIAGNOSIS — R002 Palpitations: Secondary | ICD-10-CM

## 2015-05-17 DIAGNOSIS — K219 Gastro-esophageal reflux disease without esophagitis: Secondary | ICD-10-CM | POA: Insufficient documentation

## 2015-05-17 DIAGNOSIS — I10 Essential (primary) hypertension: Secondary | ICD-10-CM | POA: Insufficient documentation

## 2015-05-17 DIAGNOSIS — Z79899 Other long term (current) drug therapy: Secondary | ICD-10-CM | POA: Insufficient documentation

## 2015-05-17 DIAGNOSIS — Z880 Allergy status to penicillin: Secondary | ICD-10-CM | POA: Insufficient documentation

## 2015-05-17 DIAGNOSIS — Z8742 Personal history of other diseases of the female genital tract: Secondary | ICD-10-CM | POA: Diagnosis not present

## 2015-05-17 DIAGNOSIS — E119 Type 2 diabetes mellitus without complications: Secondary | ICD-10-CM | POA: Diagnosis not present

## 2015-05-17 DIAGNOSIS — Z87891 Personal history of nicotine dependence: Secondary | ICD-10-CM | POA: Diagnosis not present

## 2015-05-17 HISTORY — DX: Dorsalgia, unspecified: M54.9

## 2015-05-17 LAB — BASIC METABOLIC PANEL
Anion gap: 11 (ref 5–15)
BUN: 12 mg/dL (ref 6–20)
CO2: 23 mmol/L (ref 22–32)
Calcium: 8.8 mg/dL — ABNORMAL LOW (ref 8.9–10.3)
Chloride: 105 mmol/L (ref 101–111)
Creatinine, Ser: 0.97 mg/dL (ref 0.44–1.00)
GFR calc Af Amer: >60 mL/min (ref >60)
GFR calc non Af Amer: >60 mL/min (ref >60)
Glucose, Bld: 110 mg/dL — ABNORMAL HIGH (ref 65–99)
Potassium: 3.7 mmol/L (ref 3.5–5.1)
Sodium: 139 mmol/L (ref 135–145)

## 2015-05-17 LAB — CBC
HCT: 41.5 % (ref 36.0–46.0)
Hemoglobin: 14 g/dL (ref 12.0–15.0)
MCH: 29.7 pg (ref 26.0–34.0)
MCHC: 33.7 g/dL (ref 30.0–36.0)
MCV: 87.9 fL (ref 78.0–100.0)
Platelets: 241 10*3/uL (ref 150–400)
RBC: 4.72 MIL/uL (ref 3.87–5.11)
RDW: 12.7 % (ref 11.5–15.5)
WBC: 9.5 10*3/uL (ref 4.0–10.5)

## 2015-05-17 LAB — D-DIMER, QUANTITATIVE (NOT AT ARMC): D-Dimer, Quant: 0.36 ug/mL-FEU (ref 0.00–0.48)

## 2015-05-17 LAB — I-STAT TROPONIN, ED: Troponin i, poc: 0 ng/mL (ref 0.00–0.08)

## 2015-05-17 NOTE — ED Provider Notes (Signed)
CSN: [id number]     Arrival date & time 05/17/15  1917 History   First MD Initiated Contact with Patient 05/17/15 2110     Chief Complaint  Patient presents with  . Palpitations     (Consider location/radiation/quality/duration/timing/severity/associated sxs/prior Treatment) HPI Comments: Patient with a history of HTN and palpitations presents today with a chief complaint of palpitations.  She reports that she began having palpitations approximately one hour prior to arrival while reading a magazine.  She states that she checked her HR at that time and it was between 100-120.  She states that she attempted to bear down and splash water on her face, but did not feel that it helped.  She states that the palpitations lasted for approximately one hour and then resolved.  She reports that the palpitations were associated with some tightness of the left side of her chest and SOB.  She reports improvement of the chest pain at this time.  She states that she was on Carvedilol in the past for palpitations.  Her PCP recently changed the Carvedilol to Propanolol.  However, she states that the Propranolol made her bradycardic.  Therefore, she states that she stopped taking the Propranolol 1.5 weeks ago.  She denies any history of PE or DVT.  She is currently on OCP.  No prolonged travel or surgeries in the past 4 weeks.  Patient is a 39 y.o. female presenting with palpitations. The history is provided by the patient.  Palpitations   Past Medical History  Diagnosis Date  . Hypertension   . GERD (gastroesophageal reflux disease)   . Tachycardia   . Diabetes mellitus without complication   . Fibroid uterus   . Back pain    Past Surgical History  Procedure Laterality Date  . Wisdom tooth extraction     Family History  Problem Relation Age of Onset  . Seizures Mother   . Cancer Father     Liver  . Diabetes Father 67  . Hypertension Father   . Heart disease Father 50    CEA, LE Stenting  .  Alzheimer's disease Maternal Grandmother   . Heart disease Maternal Grandfather    History  Substance Use Topics  . Smoking status: Former Smoker -- 0.25 packs/day for 10 years    Types: Cigarettes    Quit date: 08/19/2014  . Smokeless tobacco: Never Used  . Alcohol Use: 0.0 oz/week    0 Standard drinks or equivalent per week     Comment: Usually 1 time a month   OB History    Gravida Para Term Preterm AB TAB SAB Ectopic Multiple Living   2 0 0 0 2 1 1 0 0 0      Review of Systems  Cardiovascular: Positive for palpitations.  All other systems reviewed and are negative.     Allergies  Penicillins and Tamiflu  Home Medications   Prior to Admission medications   Medication Sig Start Date End Date Taking? Authorizing Provider  ergocalciferol (VITAMIN D2) 50000 UNITS capsule Take 1 capsule (50,000 Units total) by mouth once a week. 03/12/15  Yes [name], MD  Ferrous Sulfate (IRON) 325 (65 FE) MG TABS Take 1 tablet by mouth daily.   Yes Historical Provider, MD  fluticasone (FLONASE) 50 MCG/ACT nasal spray Place 1 spray into both nostrils daily as needed for allergies or rhinitis. 10/15/14  Yes Chelle Jeffery, PA-C  hydroxypropyl methylcellulose / hypromellose (ISOPTO TEARS / GONIOVISC) 2.5 % ophthalmic solution Place 1 drop into  both eyes 3 (three) times daily as needed for dry eyes.   Yes Historical Provider, MD  ibuprofen (ADVIL,MOTRIN) 200 MG tablet Take 200 mg by mouth daily as needed for moderate pain.   Yes Historical Provider, MD  LORazepam (ATIVAN) 0.5 MG tablet Take 1 tablet (0.5 mg total) by mouth 2 (two) times daily as needed for anxiety. 12/15/14  Yes [name], MD  Norethindrone-Ethinyl Estradiol-Fe Biphas (LO LOESTRIN FE) 1 MG-10 MCG / 10 MCG tablet Take 1 tablet by mouth daily.   Yes Historical Provider, MD  ranitidine (ZANTAC) 150 MG tablet TAKE ONE TABLET BY MOUTH TWICE DAILY. Patient taking differently: Take 150 mg by mouth 2 (two) times daily.  05/03/15  Yes Chelle  Jeffery, PA-C  propranolol (INDERAL) 20 MG tablet Take 1 tablet (20 mg total) by mouth 4 (four) times daily as needed (anxiety, palpitations). Patient not taking: Reported on 05/17/2015 03/03/15   [name], MD   BP 166/94 mmHg  Pulse 71  Temp(Src) 98.7 F (37.1 C) (Oral)  Resp 20  Ht 5\' 4"  (1.626 m)  Wt 193 lb (87.544 kg)  BMI 33.11 kg/m2  SpO2 100%  LMP 05/02/2015 (Exact Date) Physical Exam  Constitutional: She appears well-developed and well-nourished.  HENT:  Head: Normocephalic and atraumatic.  Mouth/Throat: Oropharynx is clear and moist.  Neck: Normal range of motion. Neck supple.  Cardiovascular: Normal rate, regular rhythm and normal heart sounds.   Pulses:      Dorsalis pedis pulses are 2+ on the right side, and 2+ on the left side.  Pulmonary/Chest: Effort normal and breath sounds normal.  Abdominal: Soft. Bowel sounds are normal. She exhibits no distension and no mass. There is no tenderness. There is no rebound and no guarding.  Musculoskeletal: Normal range of motion.  No LE edema or erythema  Skin: Skin is warm and dry.  Psychiatric: She has a normal mood and affect.  Nursing note and vitals reviewed.   ED Course  Procedures (including critical care time) Labs Review Labs Reviewed  BASIC METABOLIC PANEL - Abnormal; Notable for the following:    Glucose, Bld 110 (*)    Calcium 8.8 (*)    All other components within normal limits  CBC  D-DIMER, QUANTITATIVE (NOT AT [facility])  Randolm Idol, ED    Imaging Review Dg Chest 2 View  05/17/2015   CLINICAL DATA:  Chronic left-sided chest pain.  EXAM: CHEST  2 VIEW  COMPARISON:  Apr 06, 2015.  FINDINGS: The heart size and mediastinal contours are within normal limits. Both lungs are clear. No pneumothorax or pleural effusion is noted. The visualized skeletal structures are unremarkable.  IMPRESSION: No active cardiopulmonary disease.   Electronically Signed   By: Marijo Conception, M.D.   On: 05/17/2015 20:09     EKG  Interpretation   Date/Time:  Sunday May 17 2015 19:24:27 EDT Ventricular Rate:  99 PR Interval:  138 QRS Duration: 88 QT Interval:  337 QTC Calculation: 432 R Axis:   26 Text Interpretation:  Sinus rhythm Low voltage, precordial leads Baseline  wander in lead(s) V6 Confirmed by HARRISON  MD, FORREST ([id number]) on  05/17/2015 10:33:10 PM     Today's Vitals   05/17/15 2230 05/17/15 2300 05/17/15 2330 05/17/15 2332  BP: 134/80 137/89 136/80   Pulse: 64 74 66   Temp:    98.3 F (36.8 C)  TempSrc:    Oral  Resp: 14 17 16    Height:  Weight:      SpO2: 100% 100% 100%   PainSc:    4    MDM   Final diagnoses:  None   Patient presents today with a chief complaint of palpitations.  She has a history of palpitations and was on Propranolol, but was taken off of this 1.5 weeks ago.  No ischemic changes on EKG, sinus rhythm.  Troponin negative.  Labs unremarkable.  Patient initially mildly tachycardic, but tachycardia resolved in the ED without intervention.  D-dimer negative.  Patient stable for discharge.  Return precautions given.  Patient instructed to follow up with Cardiology.    Hyman Bible, PA-C 05/18/15 [place], MD 05/22/15 [phone]

## 2015-05-17 NOTE — ED Notes (Signed)
Pt states that she was watching a movie and noted her heart "pounding away"; pt states that her heart rate at home was 100-120 while sitting on the cough; pt states that she taken off her blood pressure medication approx 10 days ago per her MD because of Bradycardia; pt also c/o intermittent shortness of breath and feeling like she needs to catch her breath; pt also c/o left side chest pain radiating to her shoulder that she describes as tightness; pt feeling anxious over her symptoms

## 2015-05-18 ENCOUNTER — Ambulatory Visit
Admission: RE | Admit: 2015-05-18 | Discharge: 2015-05-18 | Disposition: A | Discharge status: Home / Self Care | Payer: 59 | Source: Ambulatory Visit | Attending: Family Medicine | Admitting: Family Medicine

## 2015-05-18 DIAGNOSIS — R1310 Dysphagia, unspecified: Secondary | ICD-10-CM

## 2015-05-19 ENCOUNTER — Encounter (HOSPITAL_COMMUNITY): Payer: Self-pay | Admitting: *Deleted

## 2015-05-19 ENCOUNTER — Emergency Department (HOSPITAL_COMMUNITY)
Admission: EM | Admit: 2015-05-19 | Discharge: 2015-05-20 | Disposition: A | Discharge status: Home / Self Care | Payer: 59 | Attending: Emergency Medicine | Admitting: Emergency Medicine

## 2015-05-19 DIAGNOSIS — W228XXA Striking against or struck by other objects, initial encounter: Secondary | ICD-10-CM | POA: Diagnosis not present

## 2015-05-19 DIAGNOSIS — S060X0A Concussion without loss of consciousness, initial encounter: Secondary | ICD-10-CM | POA: Insufficient documentation

## 2015-05-19 DIAGNOSIS — Z793 Long term (current) use of hormonal contraceptives: Secondary | ICD-10-CM | POA: Diagnosis not present

## 2015-05-19 DIAGNOSIS — Z880 Allergy status to penicillin: Secondary | ICD-10-CM | POA: Insufficient documentation

## 2015-05-19 DIAGNOSIS — Z86018 Personal history of other benign neoplasm: Secondary | ICD-10-CM | POA: Insufficient documentation

## 2015-05-19 DIAGNOSIS — Y998 Other external cause status: Secondary | ICD-10-CM | POA: Diagnosis not present

## 2015-05-19 DIAGNOSIS — K219 Gastro-esophageal reflux disease without esophagitis: Secondary | ICD-10-CM | POA: Diagnosis not present

## 2015-05-19 DIAGNOSIS — Y92009 Unspecified place in unspecified non-institutional (private) residence as the place of occurrence of the external cause: Secondary | ICD-10-CM | POA: Diagnosis not present

## 2015-05-19 DIAGNOSIS — S0990XA Unspecified injury of head, initial encounter: Secondary | ICD-10-CM | POA: Diagnosis present

## 2015-05-19 DIAGNOSIS — I10 Essential (primary) hypertension: Secondary | ICD-10-CM | POA: Diagnosis not present

## 2015-05-19 DIAGNOSIS — Z87891 Personal history of nicotine dependence: Secondary | ICD-10-CM | POA: Insufficient documentation

## 2015-05-19 DIAGNOSIS — Y93E5 Activity, floor mopping and cleaning: Secondary | ICD-10-CM | POA: Diagnosis not present

## 2015-05-19 DIAGNOSIS — Z7951 Long term (current) use of inhaled steroids: Secondary | ICD-10-CM | POA: Insufficient documentation

## 2015-05-19 DIAGNOSIS — Z79899 Other long term (current) drug therapy: Secondary | ICD-10-CM | POA: Insufficient documentation

## 2015-05-19 DIAGNOSIS — E119 Type 2 diabetes mellitus without complications: Secondary | ICD-10-CM | POA: Diagnosis not present

## 2015-05-19 NOTE — ED Notes (Signed)
Pt states she was cleaning, when she stood back up she hit her head "really hard" on a door knob, L posterior, pt states her head is throbbing and she got sick x 1. Pt a/o x 4 at this time.

## 2015-05-20 ENCOUNTER — Emergency Department (HOSPITAL_COMMUNITY): Payer: 59

## 2015-05-20 ENCOUNTER — Other Ambulatory Visit (HOSPITAL_COMMUNITY): Payer: 59

## 2015-05-20 MED ORDER — HYDROCODONE-ACETAMINOPHEN 5-325 MG PO TABS: 1.0000 | ORAL_TABLET | ORAL | Status: DC | PRN

## 2015-05-20 MED ORDER — ACETAMINOPHEN 500 MG PO TABS
1000.0000 mg | ORAL_TABLET | Freq: Once | ORAL | Status: AC
  Administered 2015-05-20: 1000 mg via ORAL
  Filled 2015-05-20: qty 2

## 2015-05-20 MED ORDER — ONDANSETRON 4 MG PO TBDP
4.0000 mg | ORAL_TABLET | Freq: Once | ORAL | Status: DC
  Filled 2015-05-20: qty 1

## 2015-05-20 MED ORDER — ONDANSETRON 4 MG PO TBDP: 4.0000 mg | ORAL_TABLET | Freq: Three times a day (TID) | ORAL | Status: DC | PRN

## 2015-05-20 NOTE — ED Provider Notes (Signed)
TIME SEEN: 2:05 AM  CHIEF COMPLAINT: Head injury  HPI: Pt is a 39 y.o. feel with history of hypertension, diabetes who presents emergency department after a head injury that occurred 2-3 hours prior to arrival. States that she was cleaning her house when she stood up from a crouched position and hit her head on a metal knob on one of her cabinets. States that she did not pass out but felt very "woozy". States that she has felt dizzy, nauseous and "out of it". She has vomited once. Describes the headache as severe, throbbing, diffuse without radiation. Has chronic back pain but no new neck or back pain. No numbness, tingling or focal weakness. Not on anticoagulation.  ROS: See HPI Constitutional: no fever  Eyes: no drainage  ENT: no runny nose   Cardiovascular:  no chest pain  Resp: no SOB  GI: no vomiting GU: no dysuria Integumentary: no rash  Allergy: no hives  Musculoskeletal: no leg swelling  Neurological: no slurred speech ROS otherwise negative  PAST MEDICAL HISTORY/PAST SURGICAL HISTORY:  Past Medical History  Diagnosis Date  . Hypertension   . GERD (gastroesophageal reflux disease)   . Tachycardia   . Diabetes mellitus without complication   . Fibroid uterus   . Back pain     MEDICATIONS:  Prior to Admission medications   Medication Sig Start Date End Date Taking? Authorizing Provider  ergocalciferol (VITAMIN D2) 50000 UNITS capsule Take 1 capsule (50,000 Units total) by mouth once a week. 03/12/15   [name], MD  Ferrous Sulfate (IRON) 325 (65 FE) MG TABS Take 1 tablet by mouth daily.    Historical Provider, MD  fluticasone (FLONASE) 50 MCG/ACT nasal spray Place 1 spray into both nostrils daily as needed for allergies or rhinitis. 10/15/14   Chelle Jeffery, PA-C  hydroxypropyl methylcellulose / hypromellose (ISOPTO TEARS / GONIOVISC) 2.5 % ophthalmic solution Place 1 drop into both eyes 3 (three) times daily as needed for dry eyes.    Historical Provider, MD  ibuprofen  (ADVIL,MOTRIN) 200 MG tablet Take 200 mg by mouth daily as needed for moderate pain.    Historical Provider, MD  LORazepam (ATIVAN) 0.5 MG tablet Take 1 tablet (0.5 mg total) by mouth 2 (two) times daily as needed for anxiety. 12/15/14   [name], MD  Norethindrone-Ethinyl Estradiol-Fe Biphas (LO LOESTRIN FE) 1 MG-10 MCG / 10 MCG tablet Take 1 tablet by mouth daily.    Historical Provider, MD  propranolol (INDERAL) 20 MG tablet Take 1 tablet (20 mg total) by mouth 4 (four) times daily as needed (anxiety, palpitations). Patient not taking: Reported on 05/17/2015 03/03/15   [name], MD  ranitidine (ZANTAC) 150 MG tablet TAKE ONE TABLET BY MOUTH TWICE DAILY. Patient taking differently: Take 150 mg by mouth 2 (two) times daily.  05/03/15   Harrison Mons, PA-C    ALLERGIES:  Allergies  Allergen Reactions  . Penicillins Hives  . Tamiflu [Oseltamivir Phosphate] Nausea And Vomiting    SOCIAL HISTORY:  History  Substance Use Topics  . Smoking status: Former Smoker -- 0.25 packs/day for 10 years    Types: Cigarettes    Quit date: 08/19/2014  . Smokeless tobacco: Never Used  . Alcohol Use: 0.0 oz/week    0 Standard drinks or equivalent per week     Comment: Usually 1 time a month    FAMILY HISTORY: Family History  Problem Relation Age of Onset  . Seizures Mother   . Cancer Father  Liver  . Diabetes Father 101  . Hypertension Father   . Heart disease Father 70    CEA, LE [place]  . Alzheimer's disease Maternal Grandmother   . Heart disease Maternal Grandfather     EXAM: BP 162/101 mmHg  Pulse 81  Temp(Src) 98 F (36.7 C) (Oral)  Resp 18  Ht 5\' 4"  (1.626 m)  Wt 188 lb (85.276 kg)  BMI 32.25 kg/m2  SpO2 100%  LMP 05/02/2015 (Exact Date) CONSTITUTIONAL: Alert and oriented and responds appropriately to questions. Well-appearing; well-nourished; GCS 15 HEAD: Normocephalic; small hematoma with tenderness over the left occipital region EYES: Conjunctivae clear, PERRL,  EOMI ENT: normal nose; no rhinorrhea; moist mucous membranes; pharynx without lesions noted; no dental injury; no septal hematoma NECK: Supple, no meningismus, no LAD; no midline spinal tenderness, step-off or deformity CARD: RRR; S1 and S2 appreciated; no murmurs, no clicks, no rubs, no gallops RESP: Normal chest excursion without splinting or tachypnea; breath sounds clear and equal bilaterally; no wheezes, no rhonchi, no rales; no hypoxia or respiratory distress CHEST:  chest wall stable, no crepitus or ecchymosis or deformity, nontender to palpation ABD/GI: Normal bowel sounds; non-distended; soft, non-tender, no rebound, no guarding PELVIS:  stable, nontender to palpation BACK:  The back appears normal and is non-tender to palpation, there is no CVA tenderness; no midline spinal tenderness, step-off or deformity EXT: Normal ROM in all joints; non-tender to palpation; no edema; normal capillary refill; no cyanosis, no bony tenderness or bony deformity of patient's extremities, no joint effusion, no ecchymosis or lacerations    SKIN: Normal color for age and race; warm NEURO: Moves all extremities equally, sensation to light touch intact diffusely, cranial nerves II through XII intact PSYCH: The patient's mood and manner are appropriate. Grooming and personal hygiene are appropriate.  MEDICAL DECISION MAKING: Patient here with head injury. Likely postconcussive symptoms. Head CT shows no acute intracranial abnormality. Given Tylenol in the emergency department and she does not have anyone to drive her home. We'll give her prescription for Vicodin to use as needed for pain, Zofran for nausea. Provider work note and instructions for rest following her concussion. Discussed head injury return precautions. She verbalized understanding and is comfortable with this plan.       [place], DO 05/20/15 [phone]

## 2015-05-20 NOTE — Discharge Instructions (Signed)
Concussion A concussion, or closed-head injury, is a brain injury caused by a direct blow to the head or by a quick and sudden movement (jolt) of the head or neck. Concussions are usually not life-threatening. Even so, the effects of a concussion can be serious. If you have had a concussion before, you are more likely to experience concussion-like symptoms after a direct blow to the head.  CAUSES  Direct blow to the head, such as from running into another player during a soccer game, being hit in a fight, or hitting your head on a hard surface.  A jolt of the head or neck that causes the brain to move back and forth inside the skull, such as in a car crash. SIGNS AND SYMPTOMS The signs of a concussion can be hard to notice. Early on, they may be missed by you, family members, and health care providers. You may look fine but act or feel differently. Symptoms are usually temporary, but they may last for days, weeks, or even longer. Some symptoms may appear right away while others may not show up for hours or days. Every head injury is different. Symptoms include:  Mild to moderate headaches that will not go away.  A feeling of pressure inside your head.  Having more trouble than usual:  Learning or remembering things you have heard.  Answering questions.  Paying attention or concentrating.  Organizing daily tasks.  Making decisions and solving problems.  Slowness in thinking, acting or reacting, speaking, or reading.  Getting lost or being easily confused.  Feeling tired all the time or lacking energy (fatigued).  Feeling drowsy.  Sleep disturbances.  Sleeping more than usual.  Sleeping less than usual.  Trouble falling asleep.  Trouble sleeping (insomnia).  Loss of balance or feeling lightheaded or dizzy.  Nausea or vomiting.  Numbness or tingling.  Increased sensitivity to:  Sounds.  Lights.  Distractions.  Vision problems or eyes that tire  easily.  Diminished sense of taste or smell.  Ringing in the ears.  Mood changes such as feeling sad or anxious.  Becoming easily irritated or angry for little or no reason.  Lack of motivation.  Seeing or hearing things other people do not see or hear (hallucinations). DIAGNOSIS Your health care provider can usually diagnose a concussion based on a description of your injury and symptoms. He or she will ask whether you passed out (lost consciousness) and whether you are having trouble remembering events that happened right before and during your injury. Your evaluation might include:  A brain scan to look for signs of injury to the brain. Even if the test shows no injury, you may still have a concussion.  Blood tests to be sure other problems are not present. TREATMENT  Concussions are usually treated in an emergency department, in urgent care, or at a clinic. You may need to stay in the hospital overnight for further treatment.  Tell your health care provider if you are taking any medicines, including prescription medicines, over-the-counter medicines, and natural remedies. Some medicines, such as blood thinners (anticoagulants) and aspirin, may increase the chance of complications. Also tell your health care provider whether you have had alcohol or are taking illegal drugs. This information may affect treatment.  Your health care provider will send you home with important instructions to follow.  How fast you will recover from a concussion depends on many factors. These factors include how severe your concussion is, what part of your brain was injured, your  age, and how healthy you were before the concussion.  Most people with mild injuries recover fully. Recovery can take time. In general, recovery is slower in older persons. Also, persons who have had a concussion in the past or have other medical problems may find that it takes longer to recover from their current injury. HOME  CARE INSTRUCTIONS General Instructions  Carefully follow the directions your health care provider gave you.  Only take over-the-counter or prescription medicines for pain, discomfort, or fever as directed by your health care provider.  Take only those medicines that your health care provider has approved.  Do not drink alcohol until your health care provider says you are well enough to do so. Alcohol and certain other drugs may slow your recovery and can put you at risk of further injury.  If it is harder than usual to remember things, write them down.  If you are easily distracted, try to do one thing at a time. For example, do not try to watch TV while fixing dinner.  Talk with family members or close friends when making important decisions.  Keep all follow-up appointments. Repeated evaluation of your symptoms is recommended for your recovery.  Watch your symptoms and tell others to do the same. Complications sometimes occur after a concussion. Older adults with a brain injury may have a higher risk of serious complications, such as a blood clot on the brain.  Tell your teachers, school nurse, school counselor, coach, athletic trainer, or work Freight forwarder about your injury, symptoms, and restrictions. Tell them about what you can or cannot do. They should watch for:  Increased problems with attention or concentration.  Increased difficulty remembering or learning new information.  Increased time needed to complete tasks or assignments.  Increased irritability or decreased ability to cope with stress.  Increased symptoms.  Rest. Rest helps the brain to heal. Make sure you:  Get plenty of sleep at night. Avoid staying up late at night.  Keep the same bedtime hours on weekends and weekdays.  Rest during the day. Take daytime naps or rest breaks when you feel tired.  Limit activities that require a lot of thought or concentration. These include:  Doing homework or job-related  work.  Watching TV.  Working on the computer.  Avoid any situation where there is potential for another head injury (football, hockey, soccer, basketball, martial arts, downhill snow sports and horseback riding). Your condition will get worse every time you experience a concussion. You should avoid these activities until you are evaluated by the appropriate follow-up health care providers. Returning To Your Regular Activities You will need to return to your normal activities slowly, not all at once. You must give your body and brain enough time for recovery.  Do not return to sports or other athletic activities until your health care provider tells you it is safe to do so.  Ask your health care provider when you can drive, ride a bicycle, or operate heavy machinery. Your ability to react may be slower after a brain injury. Never do these activities if you are dizzy.  Ask your health care provider about when you can return to work or school. Preventing Another Concussion It is very important to avoid another brain injury, especially before you have recovered. In rare cases, another injury can lead to permanent brain damage, brain swelling, or death. The risk of this is greatest during the first 7-10 days after a head injury. Avoid injuries by:  Wearing a seat  belt when riding in a car.  Drinking alcohol only in moderation.  Wearing a helmet when biking, skiing, skateboarding, skating, or doing similar activities.  Avoiding activities that could lead to a second concussion, such as contact or recreational sports, until your health care provider says it is okay.  Taking safety measures in your home.  Remove clutter and tripping hazards from floors and stairways.  Use grab bars in bathrooms and handrails by stairs.  Place non-slip mats on floors and in bathtubs.  Improve lighting in dim areas. SEEK MEDICAL CARE IF:  You have increased problems paying attention or  concentrating.  You have increased difficulty remembering or learning new information.  You need more time to complete tasks or assignments than before.  You have increased irritability or decreased ability to cope with stress.  You have more symptoms than before. Seek medical care if you have any of the following symptoms for more than 2 weeks after your injury:  Lasting (chronic) headaches.  Dizziness or balance problems.  Nausea.  Vision problems.  Increased sensitivity to noise or light.  Depression or mood swings.  Anxiety or irritability.  Memory problems.  Difficulty concentrating or paying attention.  Sleep problems.  Feeling tired all the time. SEEK IMMEDIATE MEDICAL CARE IF:  You have severe or worsening headaches. These may be a sign of a blood clot in the brain.  You have weakness (even if only in one hand, leg, or part of the face).  You have numbness.  You have decreased coordination.  You vomit repeatedly.  You have increased sleepiness.  One pupil is larger than the other.  You have convulsions.  You have slurred speech.  You have increased confusion. This may be a sign of a blood clot in the brain.  You have increased restlessness, agitation, or irritability.  You are unable to recognize people or places.  You have neck pain.  It is difficult to wake you up.  You have unusual behavior changes.  You lose consciousness. MAKE SURE YOU:  Understand these instructions.  Will watch your condition.  Will get help right away if you are not doing well or get worse. Document Released: 01/14/2004 Document Revised: 10/29/2013 Document Reviewed: 05/16/2013 [facility] Patient Information 2015 [place], [place]. This information is not intended to replace advice given to you by your health care provider. Make sure you discuss any questions you have with your health care provider.  Head Injury You have received a head injury. It does not appear  serious at this time. Headaches and vomiting are common following head injury. It should be easy to awaken from sleeping. Sometimes it is necessary for you to stay in the emergency department for a while for observation. Sometimes admission to the hospital may be needed. After injuries such as yours, most problems occur within the first 24 hours, but side effects may occur up to 7-10 days after the injury. It is important for you to carefully monitor your condition and contact your health care provider or seek immediate medical care if there is a change in your condition. WHAT ARE THE TYPES OF HEAD INJURIES? Head injuries can be as minor as a bump. Some head injuries can be more severe. More severe head injuries include:  A jarring injury to the brain (concussion).  A bruise of the brain (contusion). This mean there is bleeding in the brain that can cause swelling.  A cracked skull (skull fracture).  Bleeding in the brain that collects, clots, and  forms a bump (hematoma). WHAT CAUSES A HEAD INJURY? A serious head injury is most likely to happen to someone who is in a car wreck and is not wearing a seat belt. Other causes of major head injuries include bicycle or motorcycle accidents, sports injuries, and falls. HOW ARE HEAD INJURIES DIAGNOSED? A complete history of the event leading to the injury and your current symptoms will be helpful in diagnosing head injuries. Many times, pictures of the brain, such as CT or MRI are needed to see the extent of the injury. Often, an overnight hospital stay is necessary for observation.  WHEN SHOULD I SEEK IMMEDIATE MEDICAL CARE?  You should get help right away if:  You have confusion or drowsiness.  You feel sick to your stomach (nauseous) or have continued, forceful vomiting.  You have dizziness or unsteadiness that is getting worse.  You have severe, continued headaches not relieved by medicine. Only take over-the-counter or prescription medicines for  pain, fever, or discomfort as directed by your health care provider.  You do not have normal function of the arms or legs or are unable to walk.  You notice changes in the black spots in the center of the colored part of your eye (pupil).  You have a clear or bloody fluid coming from your nose or ears.  You have a loss of vision. During the next 24 hours after the injury, you must stay with someone who can watch you for the warning signs. This person should contact local emergency services (911 in the [place] if you have seizures, you become unconscious, or you are unable to wake up. HOW CAN I PREVENT A HEAD INJURY IN THE FUTURE? The most important factor for preventing major head injuries is avoiding motor vehicle accidents. To minimize the potential for damage to your head, it is crucial to wear seat belts while riding in motor vehicles. Wearing helmets while bike riding and playing collision sports (like football) is also helpful. Also, avoiding dangerous activities around the house will further help reduce your risk of head injury.  WHEN CAN I RETURN TO NORMAL ACTIVITIES AND ATHLETICS? You should be reevaluated by your health care provider before returning to these activities. If you have any of the following symptoms, you should not return to activities or contact sports until 1 week after the symptoms have stopped:  Persistent headache.  Dizziness or vertigo.  Poor attention and concentration.  Confusion.  Memory problems.  Nausea or vomiting.  Fatigue or tire easily.  Irritability.  Intolerant of bright lights or loud noises.  Anxiety or depression.  Disturbed sleep. MAKE SURE YOU:   Understand these instructions.  Will watch your condition.  Will get help right away if you are not doing well or get worse. Document Released: 10/24/2005 Document Revised: 10/29/2013 Document Reviewed: 07/01/2013 [facility] Patient Information 2015 [place], [place]. This information is not  intended to replace advice given to you by your health care provider. Make sure you discuss any questions you have with your health care provider.

## 2015-05-25 ENCOUNTER — Ambulatory Visit (HOSPITAL_COMMUNITY)
Admission: RE | Admit: 2015-05-25 | Discharge: 2015-05-25 | Disposition: A | Discharge status: Home / Self Care | Payer: 59 | Source: Ambulatory Visit | Attending: Family Medicine | Admitting: Family Medicine

## 2015-05-25 ENCOUNTER — Ambulatory Visit (INDEPENDENT_AMBULATORY_CARE_PROVIDER_SITE_OTHER): Payer: 59 | Admitting: Family Medicine

## 2015-05-25 ENCOUNTER — Other Ambulatory Visit (HOSPITAL_COMMUNITY): Payer: 59

## 2015-05-25 ENCOUNTER — Other Ambulatory Visit: Payer: Self-pay | Admitting: Family Medicine

## 2015-05-25 VITALS — BP 126/68 | HR 99 | Temp 98.3°F | Resp 18 | Ht 62.5 in | Wt 183.0 lb

## 2015-05-25 DIAGNOSIS — R1084 Generalized abdominal pain: Secondary | ICD-10-CM | POA: Diagnosis not present

## 2015-05-25 DIAGNOSIS — R110 Nausea: Secondary | ICD-10-CM | POA: Diagnosis not present

## 2015-05-25 DIAGNOSIS — R52 Pain, unspecified: Secondary | ICD-10-CM

## 2015-05-25 DIAGNOSIS — K529 Noninfective gastroenteritis and colitis, unspecified: Secondary | ICD-10-CM | POA: Diagnosis not present

## 2015-05-25 DIAGNOSIS — D72829 Elevated white blood cell count, unspecified: Secondary | ICD-10-CM | POA: Diagnosis not present

## 2015-05-25 DIAGNOSIS — R102 Pelvic and perineal pain: Secondary | ICD-10-CM | POA: Diagnosis not present

## 2015-05-25 DIAGNOSIS — R197 Diarrhea, unspecified: Secondary | ICD-10-CM | POA: Diagnosis not present

## 2015-05-25 DIAGNOSIS — R1031 Right lower quadrant pain: Secondary | ICD-10-CM

## 2015-05-25 LAB — POCT UA - MICROSCOPIC ONLY
Casts, Ur, LPF, POC: NEGATIVE
Crystals, Ur, HPF, POC: NEGATIVE
Yeast, UA: NEGATIVE

## 2015-05-25 LAB — POCT URINALYSIS DIPSTICK
Bilirubin, UA: NEGATIVE
Glucose, UA: NEGATIVE
Ketones, UA: 40
Leukocytes, UA: NEGATIVE
Nitrite, UA: NEGATIVE
Spec Grav, UA: 1.025
Urobilinogen, UA: 0.2
pH, UA: 5.5

## 2015-05-25 LAB — POCT CBC
Granulocyte percent: 78.9 %G (ref 37–80)
HCT, POC: 40 % (ref 37.7–47.9)
Hemoglobin: 13.5 g/dL (ref 12.2–16.2)
Lymph, poc: 1.6 (ref 0.6–3.4)
MCH, POC: 29.7 pg (ref 27–31.2)
MCHC: 33.8 g/dL (ref 31.8–35.4)
MCV: 87.9 fL (ref 80–97)
MID (cbc): 0.7 (ref 0–0.9)
MPV: 6.5 fL (ref 0–99.8)
POC Granulocyte: 8.8 — AB (ref 2–6.9)
POC LYMPH PERCENT: 14.7 %L (ref 10–50)
POC MID %: 6.4 %M (ref 0–12)
Platelet Count, POC: 261 10*3/uL (ref 142–424)
RBC: 4.54 M/uL (ref 4.04–5.48)
RDW, POC: 13.6 %
WBC: 11.1 10*3/uL — AB (ref 4.6–10.2)

## 2015-05-25 LAB — POCT URINE PREGNANCY: Preg Test, Ur: NEGATIVE

## 2015-05-25 MED ORDER — ONDANSETRON 4 MG PO TBDP: 4.0000 mg | ORAL_TABLET | Freq: Once | ORAL | Status: DC

## 2015-05-25 MED ORDER — CIPROFLOXACIN HCL 500 MG PO TABS: 500.0000 mg | ORAL_TABLET | Freq: Two times a day (BID) | ORAL | Status: DC

## 2015-05-25 MED ORDER — IOHEXOL 300 MG/ML  SOLN
100.0000 mL | Freq: Once | INTRAMUSCULAR | Status: AC | PRN
Start: 2015-05-25 — End: 2015-05-25
  Administered 2015-05-25: 100 mL via INTRAVENOUS

## 2015-05-25 NOTE — Patient Instructions (Addendum)
Go to [facility] and register at the Emergency Department for OUTPATIENT CT ABD/PELVIS. DO NOT REGISTER as ED patient. Start the 1st bottle of oral contrast at 6:00 pm and drink  the 2nd bottle at 7:00 pm.     Please go for your CT scan this evening as instructed.   I will talk to you on your cell phone when your results come in.   Wait at the radiology department until we talk in case you do have appendicitis.  Assuming that you do not we will make a plan to help you feel better!

## 2015-05-25 NOTE — Progress Notes (Addendum)
[facility] [address], [place] [address] [phone]  Date:  05/25/2015   Name:  Brooke Turner   DOB:  Jul 14, 1976   MRN:  [id number]  PCP:  [name], MD    Chief Complaint: Nausea and Abdominal Pain   History of Present Illness:  Brooke Turner is a 39 y.o. very pleasant female patient who presents with the following:  About a week ago she bumped her head- went to the ER and had a negative CT scan.  She has noted some headaches since then but they seem to be getting better.  Here today with a stomach concern  Yesterday she felt really nauseated, but did not vomit.  She took a tums but it did not help.  She felt bad all night and slept poorly This am she noted abd cramps twice an hour or so- worse if she eats or drinks.  The cramps last a minute or two.  In between pains she just has nausea  She also took a course of tinidazole recently for BV- she finished this yesterday.  She did not drink any alcohol.  She does use mouthwash however  LMP about 3 weeks ago and then she started menstruating again today She is having diarrhea.  This just started this am- she has had about 4 episodes today.  No blood or mucus She has not vomited.   No vaginal sx or urinary sx except for recent BV which has been treated She had negative STD testing recently per her OBG  She did quit smoking a couple of years ago  She did not try the zofran that she has from the ER yet (from her visit for concussion) No history of abd opeations  She has not had this sort of problem in the past.  No history of IBD Patient Active Problem List   Diagnosis Date Noted  . Dyspnea 03/18/2015  . Post-traumatic stress reaction 02/25/2015  . Anxiety disorder due to general medical condition with panic attack 02/25/2015  . Subserous leiomyoma of uterus 02/25/2015  . Anxiety about health 02/25/2015  . Snorings 09/30/2014  . Fatigue 09/26/2014  . Dizziness and giddiness  09/11/2014  . Tachycardia 09/11/2014  . Precordial chest pain 09/11/2014  . Prediabetes 08/26/2014  . Acanthosis nigricans 03/19/2014  . Vitamin D deficiency 07/15/2013  . Large breasts 09/12/2012  . HTN (hypertension) 02/17/2012  . Contraception management 02/17/2012  . Obesity, Class III, BMI 40-49.9 (morbid obesity) 02/17/2012  . Anxiety disorder 02/17/2012  . Chronic back pain 02/17/2012    Past Medical History  Diagnosis Date  . Hypertension   . GERD (gastroesophageal reflux disease)   . Tachycardia   . Diabetes mellitus without complication   . Fibroid uterus   . Back pain     Past Surgical History  Procedure Laterality Date  . Wisdom tooth extraction      History  Substance Use Topics  . Smoking status: Former Smoker -- 0.25 packs/day for 10 years    Types: Cigarettes    Quit date: 08/19/2014  . Smokeless tobacco: Never Used  . Alcohol Use: 0.0 oz/week    0 Standard drinks or equivalent per week     Comment: Usually 1 time a month    Family History  Problem Relation Age of Onset  . Seizures Mother   . Cancer Father     Liver  . Diabetes Father 68  . Hypertension Father   . Heart disease Father  60    CEA, LE [place]  . Alzheimer's disease Maternal Grandmother   . Heart disease Maternal Grandfather     Allergies  Allergen Reactions  . Penicillins Hives  . Tamiflu [Oseltamivir Phosphate] Nausea And Vomiting    Medication list has been reviewed and updated.  Current Outpatient Prescriptions on File Prior to Visit  Medication Sig Dispense Refill  . ergocalciferol (VITAMIN D2) 50000 UNITS capsule Take 1 capsule (50,000 Units total) by mouth once a week. 12 capsule 1  . Ferrous Sulfate (IRON) 325 (65 FE) MG TABS Take 1 tablet by mouth daily.    . fluticasone (FLONASE) 50 MCG/ACT nasal spray Place 1 spray into both nostrils daily as needed for allergies or rhinitis. 16 g 12  . hydroxypropyl methylcellulose / hypromellose (ISOPTO TEARS / GONIOVISC) 2.5  % ophthalmic solution Place 1 drop into both eyes 3 (three) times daily as needed for dry eyes.    Marland Kitchen ibuprofen (ADVIL,MOTRIN) 200 MG tablet Take 200 mg by mouth daily as needed for moderate pain.    Marland Kitchen LORazepam (ATIVAN) 0.5 MG tablet Take 1 tablet (0.5 mg total) by mouth 2 (two) times daily as needed for anxiety. 30 tablet 1  . Norethindrone-Ethinyl Estradiol-Fe Biphas (LO LOESTRIN FE) 1 MG-10 MCG / 10 MCG tablet Take 1 tablet by mouth daily.    . ranitidine (ZANTAC) 150 MG tablet TAKE ONE TABLET BY MOUTH TWICE DAILY. (Patient taking differently: Take 150 mg by mouth 2 (two) times daily. ) 60 tablet 0  . HYDROcodone-acetaminophen (NORCO/VICODIN) 5-325 MG per tablet Take 1 tablet by mouth every 4 (four) hours as needed. (Patient not taking: Reported on 05/25/2015) 15 tablet 0  . ondansetron (ZOFRAN ODT) 4 MG disintegrating tablet Take 1 tablet (4 mg total) by mouth every 8 (eight) hours as needed for nausea or vomiting. (Patient not taking: Reported on 05/25/2015) 20 tablet 0  . propranolol (INDERAL) 20 MG tablet Take 1 tablet (20 mg total) by mouth 4 (four) times daily as needed (anxiety, palpitations). (Patient not taking: Reported on 05/17/2015) 120 tablet 0   No current facility-administered medications on file prior to visit.    Review of Systems:  As per HPI- otherwise negative.   Physical Examination: Filed Vitals:   05/25/15 1620  BP: 126/68  Pulse: 99  Temp: 98.3 F (36.8 C)  Resp: 18   Filed Vitals:   05/25/15 1620  Height: 5' 2.5" (1.588 m)  Weight: 183 lb (83.008 kg)   Body mass index is 32.92 kg/(m^2). Ideal Body Weight: Weight in (lb) to have BMI = 25: 138.6  GEN: WDWN, NAD, Non-toxic, A & O x 3, overweight, appears well  HEENT: Atraumatic, Normocephalic. Neck supple. No masses, No LAD. Ears and Nose: No external deformity. CV: RRR, No M/G/R. No JVD. No thrill. No extra heart sounds. PULM: CTA B, no wheezes, crackles, rhonchi. No retractions. No resp. distress. No  accessory muscle use. ABD: S,  ND, +BS. No rebound. No HSM.  She has epigastric and especially RLQ tenderness, mild guarding in these areas.  tenderness varies with re-exam but persists in the RLQ, concerning for appendicitis EXTR: No c/c/e NEURO Normal gait.  PSYCH: Normally interactive. Conversant. Not depressed or anxious appearing.  Calm demeanor.  Gu: no CMT, no abnormal discharge, vagina and vulva normal.   Uterus non- tender.    Gave her 4mg  zofran in clinic- it did not make a difference in her sx Results for orders placed or performed in visit on 05/25/15  POCT CBC  Result Value Ref Range   WBC 11.1 (A) 4.6 - 10.2 K/uL   Lymph, poc 1.6 0.6 - 3.4   POC LYMPH PERCENT 14.7 10 - 50 %L   MID (cbc) 0.7 0 - 0.9   POC MID % 6.4 0 - 12 %M   POC Granulocyte 8.8 (A) 2 - 6.9   Granulocyte percent 78.9 37 - 80 %G   RBC 4.54 4.04 - 5.48 M/uL   Hemoglobin 13.5 12.2 - 16.2 g/dL   HCT, POC 40.0 37.7 - 47.9 %   MCV 87.9 80 - 97 fL   MCH, POC 29.7 27 - 31.2 pg   MCHC 33.8 31.8 - 35.4 g/dL   RDW, POC 13.6 %   Platelet Count, POC 261 142 - 424 K/uL   MPV 6.5 0 - 99.8 fL  POCT UA - Microscopic Only  Result Value Ref Range   WBC, Ur, HPF, POC 0-3    RBC, urine, microscopic 1-5    Bacteria, U Microscopic 2+    Mucus, UA small    Epithelial cells, urine per micros 1-5    Crystals, Ur, HPF, POC negative    Casts, Ur, LPF, POC negative    Yeast, UA negative   POCT urinalysis dipstick  Result Value Ref Range   Color, UA yellow    Clarity, UA clear    Glucose, UA neg    Bilirubin, UA neg    Ketones, UA 40    Spec Grav, UA 1.025    Blood, UA mod    pH, UA 5.5    Protein, UA trace    Urobilinogen, UA 0.2    Nitrite, UA neg    Leukocytes, UA Negative Negative  POCT urine pregnancy  Result Value Ref Range   Preg Test, Ur Negative Negative    Assessment and Plan: Right lower quadrant abdominal pain  Generalized abdominal pain - Plan: POCT CBC, POCT UA - Microscopic Only, POCT  urinalysis dipstick, POCT urine pregnancy, Comprehensive metabolic panel, Amylase, Lipase, Urine culture  Diarrhea - Plan: POCT CBC, Comprehensive metabolic panel, Amylase, Lipase  Nausea without vomiting - Plan: ondansetron (ZOFRAN-ODT) disintegrating tablet 4 mg  Right lower quadrant pain - Plan: CANCELED: CT Abdomen Pelvis W Contrast  Leukocytosis  RLQ tenderness and leukocytosis.   Send for a CT now to rule- out appendicitis.  Discussed with pt in detail- she will wait for my call.  If appendicitis we will plan for general surgery consultation  Signed Lamar Blinks, MD  Received CT results as below and called her.  No appendicitis- she is relieved.  She does have colitis.  She has never had this in the past and we do not strongly suspect UC or Crohn's.  Will treat with a low residue diet and cipro for 3-5 days.  Await the rest of her labs and will follow-up with her.  She has zofran from her ER visit that she can use if needed She will let me know if not feeling better in the next day or two- Sooner if worse.    CT ABDOMEN AND PELVIS WITH CONTRAST  TECHNIQUE: Multidetector CT imaging of the abdomen and pelvis was performed using the standard protocol following bolus administration of intravenous contrast.  CONTRAST: 120mL OMNIPAQUE IOHEXOL 300 MG/ML SOLN  COMPARISON: April 29, 2009  FINDINGS: The liver, spleen, pancreas, gallbladder, adrenal glands and kidneys are normal. The aorta is normal. There is no abdominal lymphadenopathy.  There is no small bowel obstruction. There  is bowel wall thickening of the transverse colon. There is also bowel wall thickening of the cecum. The appendix is normal.  Fluid-filled bladder is normal. Bilateral ovarian cysts are identified. Small amount of free fluid is identified in the pelvis, likely physiologic. Fluid is identified within the uterine cavity. There is a uterine fibroid. The lung bases are clear. No acute abnormalities  identified within the visualized bones.  IMPRESSION: Bowel wall thickening of the transverse colon and the cecum. This is consistent with colitis, inflammatory or infectious. No bowel obstruction.   Called 7/19 to discuss the rest of her labs.  She reports that she is feeling better, did not end up starting the abx.  She wonders if we need to do anything else about the colitis.  Advised that if her sx are gone in the next few days we can lay the issue to rest, but if her sx continue will refer to GI.  She agrees with plan

## 2015-05-26 ENCOUNTER — Encounter: Payer: Self-pay | Admitting: Internal Medicine

## 2015-05-26 ENCOUNTER — Ambulatory Visit (INDEPENDENT_AMBULATORY_CARE_PROVIDER_SITE_OTHER): Payer: 59 | Admitting: Internal Medicine

## 2015-05-26 ENCOUNTER — Telehealth: Payer: Self-pay

## 2015-05-26 VITALS — BP 118/82 | HR 77 | Ht 63.5 in | Wt 183.4 lb

## 2015-05-26 DIAGNOSIS — R0600 Dyspnea, unspecified: Secondary | ICD-10-CM

## 2015-05-26 DIAGNOSIS — I10 Essential (primary) hypertension: Secondary | ICD-10-CM | POA: Diagnosis not present

## 2015-05-26 LAB — COMPREHENSIVE METABOLIC PANEL
ALT: 14 U/L (ref 0–35)
AST: 17 U/L (ref 0–37)
Albumin: 4.3 g/dL (ref 3.5–5.2)
Alkaline Phosphatase: 55 U/L (ref 39–117)
BUN: 11 mg/dL (ref 6–23)
CO2: 21 mEq/L (ref 19–32)
Calcium: 9.1 mg/dL (ref 8.4–10.5)
Chloride: 104 mEq/L (ref 96–112)
Creat: 1.11 mg/dL — ABNORMAL HIGH (ref 0.50–1.10)
Glucose, Bld: 94 mg/dL (ref 70–99)
Potassium: 4.1 mEq/L (ref 3.5–5.3)
Sodium: 137 mEq/L (ref 135–145)
Total Bilirubin: 0.6 mg/dL (ref 0.2–1.2)
Total Protein: 7.2 g/dL (ref 6.0–8.3)

## 2015-05-26 LAB — AMYLASE: Amylase: 28 U/L (ref 0–105)

## 2015-05-26 LAB — LIPASE: Lipase: <10 U/L (ref 0–75)

## 2015-05-26 NOTE — Telephone Encounter (Signed)
Please tell patient that she is okay to take Cipro for her colitis. This is the short course of antibiotic that Dr. Lorelei Pont wanted to use to treat her colitis to cover for infection. Thank you!

## 2015-05-26 NOTE — Patient Instructions (Addendum)
There is no evidence at all that you have a lung problem based on the ct scan of your chest and your lung function tests but I would be careful taking carevidol again since you appear to have been worse while on it.    To get the most out of exercise, you need to be continuously aware that you are short of breath, but never out of breath, for 30 minutes daily. As you improve, it will actually be easier for you to do the same amount of exercise  in  30 minutes so always push to the level where you are short of breath.    Let us know if losing ground with your exercise tolerance

## 2015-05-26 NOTE — Telephone Encounter (Signed)
Left message for pt to call back. To get more details.
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
## 2015-05-26 NOTE — Telephone Encounter (Signed)
Pt states she was given antibiotics yesterday and also had to have a colon screen which is inflamed. Would like to speak with someone about it. Please call [phone]

## 2015-05-26 NOTE — Telephone Encounter (Signed)
Left a detailed message with message.

## 2015-05-26 NOTE — Telephone Encounter (Signed)
Spoke with pt, she is worried about taking the ABX because her pharmacist told her it can cause colitis. She states on imaging her colon was inflamed and she does not want this to happen again because she was on an ABX recently and feels this is what caused the inflammation in the first place. She states she is a little better but wants to know if it is safe to take the Cipro. Please advise.

## 2015-05-27 ENCOUNTER — Other Ambulatory Visit: Payer: Self-pay | Admitting: Family Medicine

## 2015-05-27 LAB — URINE CULTURE: Colony Count: 4000

## 2015-05-28 ENCOUNTER — Encounter: Payer: Self-pay | Admitting: Internal Medicine

## 2015-05-28 NOTE — Assessment & Plan Note (Signed)
-   03/18/2015  Walked RA x 3 laps @ 185 ft each stopped due to end of study, moderate pace, no sob or desat or tachycardia  - 04/01/15 MCT ng - 05/26/2015  Walked RA x 3 laps @ 185 ft each stopped due to  End of study, nl pace, no sob or desat    I had an extended final summary discussion with the patient reviewing all relevant studies completed to date and  lasting 15 to 20 minutes of a 25 minute visit on the following issues:    Most likely this is panic disorder as cannot be reproduced with ex though present at rest.  rec reconditioning through regular submax ex and f/u with Dr Brigitte Pulse noting already on ativan

## 2015-05-28 NOTE — Assessment & Plan Note (Signed)
Ok off Beta blocker > if one needed in future Strongly prefer : Bystolic, the most beta -1  selective Beta blocker available in sample form, with bisoprolol the most selective generic choice  on the market.

## 2015-06-03 ENCOUNTER — Encounter: Payer: Self-pay | Admitting: *Deleted

## 2015-06-08 ENCOUNTER — Telehealth: Payer: Self-pay

## 2015-06-08 NOTE — Telephone Encounter (Signed)
PATIENT WOULD LIKE DR. SHAW TO HAND WRITE HER A PRESCRIPTION FOR HER TEST STRIPS. SHE THINKS IT IS FOR ACCU VIEW? SHE WANTS TO PICK IT UP BECAUSE SHE IS NOT SURE WHICH DRUG STORE SHE WILL USE. SHE HAS TO FIND THE CHEAPEST PHARMACY. PLEASE CALL HER WHEN IT CAN BE PICKED UP.  BEST PHONE [phone] (CELL) [place]

## 2015-06-09 MED ORDER — GLUCOSE BLOOD VI STRP: ORAL_STRIP | Status: DC

## 2015-06-09 NOTE — Telephone Encounter (Signed)
Rx printed in Sarah's name in Dr Raul Del absence today.

## 2015-06-09 NOTE — Telephone Encounter (Signed)
LMOM for pt Rx is ready for p/up.

## 2015-06-09 NOTE — Telephone Encounter (Signed)
Rx in pick up draw. 

## 2015-06-10 ENCOUNTER — Other Ambulatory Visit: Payer: Self-pay | Admitting: Family Medicine

## 2015-06-10 ENCOUNTER — Encounter: Payer: Self-pay | Admitting: Neurology

## 2015-06-10 ENCOUNTER — Ambulatory Visit (INDEPENDENT_AMBULATORY_CARE_PROVIDER_SITE_OTHER): Payer: 59 | Admitting: Neurology

## 2015-06-10 VITALS — BP 116/80 | HR 69 | Resp 16 | Ht 63.5 in | Wt 184.0 lb

## 2015-06-10 DIAGNOSIS — R200 Anesthesia of skin: Secondary | ICD-10-CM | POA: Diagnosis not present

## 2015-06-10 DIAGNOSIS — G44229 Chronic tension-type headache, not intractable: Secondary | ICD-10-CM | POA: Diagnosis not present

## 2015-06-10 MED ORDER — GLUCOSE BLOOD VI STRP: ORAL_STRIP | Status: DC
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
## 2015-06-10 NOTE — Patient Instructions (Addendum)
1. Schedule EMG/NCV of left UE and LE with Dr. Posey Pronto 2. Would start Zoloft 25mg  daily and monitor symptoms. Other options for headaches include nortriptyline, gabapentin, and topiramate. 3. Try Aleve as needed for headaches, do not take more than 2-3 a week to avoid rebound headaches 4. Follow-up in 2 months, call for any problems

## 2015-06-10 NOTE — Telephone Encounter (Signed)
Unable to find Rx. Re-ordered.

## 2015-06-11 DIAGNOSIS — R200 Anesthesia of skin: Secondary | ICD-10-CM | POA: Insufficient documentation

## 2015-06-22 ENCOUNTER — Telehealth: Payer: Self-pay | Admitting: *Deleted

## 2015-06-22 NOTE — Telephone Encounter (Addendum)
LMVM for pt again and called her mother, other contact on DPR, about pt and her appt scheduled tomorrow.

## 2015-06-22 NOTE — Telephone Encounter (Signed)
Pt called back and operator changed appt time to late afternoon, same day.  Pt did not say she was returning my call.  I called back and LMVM for her about her seeing 2 neurologists, she needed to make decision whom she was going to continue seeing.

## 2015-06-22 NOTE — Telephone Encounter (Signed)
Pt has appt scheduled tomorrow with CM/NP.  She saw another neurologist 06-10-15 [facility].  Please call back about appt.  (does not need 2 neurologists).

## 2015-06-23 ENCOUNTER — Encounter: Payer: Self-pay | Admitting: Nurse Practitioner

## 2015-06-23 ENCOUNTER — Telehealth: Payer: Self-pay | Admitting: Neurology

## 2015-06-23 ENCOUNTER — Ambulatory Visit (INDEPENDENT_AMBULATORY_CARE_PROVIDER_SITE_OTHER): Payer: 59 | Admitting: Nurse Practitioner

## 2015-06-23 ENCOUNTER — Ambulatory Visit: Payer: 59 | Admitting: Nurse Practitioner

## 2015-06-23 VITALS — BP 148/94 | HR 79 | Ht 64.0 in | Wt 181.0 lb

## 2015-06-23 DIAGNOSIS — R000 Tachycardia, unspecified: Secondary | ICD-10-CM

## 2015-06-23 DIAGNOSIS — F329 Major depressive disorder, single episode, unspecified: Secondary | ICD-10-CM

## 2015-06-23 DIAGNOSIS — R0683 Snoring: Secondary | ICD-10-CM | POA: Diagnosis not present

## 2015-06-23 DIAGNOSIS — G44229 Chronic tension-type headache, not intractable: Secondary | ICD-10-CM | POA: Diagnosis not present

## 2015-06-23 DIAGNOSIS — F32A Depression, unspecified: Secondary | ICD-10-CM

## 2015-06-23 DIAGNOSIS — R5383 Other fatigue: Secondary | ICD-10-CM | POA: Diagnosis not present

## 2015-06-23 NOTE — Telephone Encounter (Signed)
She may want to address this with Dr. Melvyn Novas, her pulmonologist, if she feels strongly that she has symptoms of OSA. He may be able to give a second opinion on this. She had a neg. Sleep study in January of this year.

## 2015-06-23 NOTE — Telephone Encounter (Signed)
Patient had sleep study about a year ago. Results did not indicate OSA. She feels like she is still having the same symptoms that she presented with, a year ago. She called our office to make an appointment with you, but it was made with Hoyle Sauer. Patient feels that her concerns were not addressed today. She was offered an appointment with you but is upset that she feels today's appointment was "wasted". Patient feels that she has OSA and that the study was done wrong. Do you have any recommendations for her?
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
## 2015-06-23 NOTE — Telephone Encounter (Signed)
Pt has made several calls changing her times for appts.  It is now 1530 today.

## 2015-06-23 NOTE — Patient Instructions (Addendum)
Pt to follow up with Dr. Rexene Alberts for sleep results per request Given a copy of her sleep test

## 2015-06-23 NOTE — Telephone Encounter (Signed)
Pt had "normal" sleep study results but is concerned about the continued symptoms she is having.  She is wanting to see she can be called between 8am-9am or 12:30pm and 1:30pm.  She was upset that she felt she wasted her visit with Hoyle Sauer who wasn't able to address the issues.

## 2015-06-23 NOTE — Telephone Encounter (Signed)
Pls schedule appt to discuss sleep study results. Patient of Dr. Rhea Belton and Hoyle Sauer, who had a sleep study in jan.

## 2015-06-24 ENCOUNTER — Telehealth: Payer: Self-pay

## 2015-06-24 NOTE — Progress Notes (Signed)
I have reviewed and agreed above plan. 

## 2015-06-24 NOTE — Telephone Encounter (Signed)
Patient called about getting an appt to discuss sleep study with Dr. Rexene Alberts. She is unable to come to appt tomorrow, so I rescheduled to 8/25. I have also put her on a list to get in sooner if there are cancellations.

## 2015-06-24 NOTE — Telephone Encounter (Signed)
Beverlee Nims please document F/U and close the encounter thanks.

## 2015-06-24 NOTE — Telephone Encounter (Signed)
See other note, f/u appt has been made for patient.

## 2015-06-24 NOTE — Telephone Encounter (Signed)
Patient was scheduled an appt with Dr. Rexene Alberts by check out

## 2015-06-25 ENCOUNTER — Telehealth: Payer: Self-pay

## 2015-06-25 ENCOUNTER — Encounter: Payer: Self-pay | Admitting: *Deleted

## 2015-06-25 ENCOUNTER — Ambulatory Visit: Payer: Self-pay | Admitting: Neurology

## 2015-06-25 DIAGNOSIS — J342 Deviated nasal septum: Secondary | ICD-10-CM

## 2015-06-25 DIAGNOSIS — J310 Chronic rhinitis: Secondary | ICD-10-CM

## 2015-06-25 DIAGNOSIS — J320 Chronic maxillary sinusitis: Secondary | ICD-10-CM

## 2015-06-25 NOTE — Telephone Encounter (Signed)
Pt wanted to see if the pre-authorization fax was sent to pharmacy for her diabetic strips to not have copay? Otherwise it will be $45.00 that she doesn't have.  Please advise [phone]

## 2015-06-26 ENCOUNTER — Other Ambulatory Visit: Payer: Self-pay

## 2015-06-26 ENCOUNTER — Other Ambulatory Visit: Payer: Self-pay | Admitting: Family Medicine

## 2015-06-26 MED ORDER — GLUCOSE BLOOD VI STRP: ORAL_STRIP | Status: DC

## 2015-06-26 NOTE — Telephone Encounter (Signed)
I have not received any notice of need for prior auth from pharm, but normally pt just needs to call her ins customer service and ask them the preferred brand test strips, so we can send in Rx for meter and strips. I'll have to check into this further on Monday.

## 2015-06-29 ENCOUNTER — Encounter: Payer: Self-pay | Admitting: Cardiology

## 2015-06-29 ENCOUNTER — Telehealth: Payer: Self-pay | Admitting: *Deleted

## 2015-06-29 ENCOUNTER — Ambulatory Visit (INDEPENDENT_AMBULATORY_CARE_PROVIDER_SITE_OTHER): Payer: 59 | Admitting: Cardiology

## 2015-06-29 ENCOUNTER — Telehealth: Payer: Self-pay

## 2015-06-29 VITALS — BP 152/96 | HR 77 | Ht 64.0 in | Wt 180.6 lb

## 2015-06-29 DIAGNOSIS — I10 Essential (primary) hypertension: Secondary | ICD-10-CM

## 2015-06-29 MED ORDER — DILTIAZEM HCL ER COATED BEADS 120 MG PO CP24: 120.0000 mg | ORAL_CAPSULE | Freq: Every day | ORAL | Status: DC

## 2015-06-29 NOTE — Telephone Encounter (Signed)
Noted  

## 2015-06-29 NOTE — Patient Instructions (Signed)
Your physician has recommended you make the following change in your medication: start new prescription for diltiazem. This has already been sent to your [facility].   Your physician recommends that you schedule a follow-up appointment in: 3 months with Dr. Percival Spanish.

## 2015-06-29 NOTE — Progress Notes (Signed)
HPI The patient presents for evaluation of dizziness and presyncope as well as chest pain. She has had an extensive workup for palpitations. She's worn an event monitor. She had normal echocardiogram. She had a negative exercise treadmill test. She's been intolerant somewhat of medications. She's had some lower heart rates with beta blockers. She did go to see a pulmonologist who thought maybe she was having panic attacks but also suggested perhaps beta-1 selective agents. She's been weaned off of carvedilol. He was given propranolol to treat anxiety and palpitations but she didn't tolerate this because of lower heart rates. She's back with the symptoms. Still happen at rest.  She's able to exercise although she worries about this because it makes her heart rate go up too fast. She does have some back pain sometimes radiates through to her chest. This is sporadic. She's not had any presyncope or syncope. She has her baseline dyspnea.  Allergies  Allergen Reactions  . Penicillins Hives  . Tamiflu [Oseltamivir Phosphate] Nausea And Vomiting    Current Outpatient Prescriptions  Medication Sig Dispense Refill  . ergocalciferol (VITAMIN D2) 50000 UNITS capsule Take 1 capsule (50,000 Units total) by mouth once a week. 12 capsule 1  . Ferrous Sulfate (IRON) 325 (65 FE) MG TABS Take 1 tablet by mouth daily.    . fluticasone (FLONASE) 50 MCG/ACT nasal spray Place 1 spray into both nostrils daily as needed for allergies or rhinitis. 16 g 12  . glucose blood (ACCU-CHEK AVIVA PLUS) test strip Test blood sugar daily. 100 each 3  . ibuprofen (ADVIL,MOTRIN) 200 MG tablet Take 200 mg by mouth daily as needed for moderate pain.    Marland Kitchen LORazepam (ATIVAN) 0.5 MG tablet Take 1 tablet (0.5 mg total) by mouth 2 (two) times daily as needed for anxiety. 30 tablet 1  . Norethindrone-Ethinyl Estradiol-Fe Biphas (LO LOESTRIN FE) 1 MG-10 MCG / 10 MCG tablet Take 1 tablet by mouth daily.    Marland Kitchen Propylene Glycol (SYSTANE  BALANCE) 0.6 % SOLN Apply to eye. As needed for dry eyes    . ranitidine (ZANTAC) 150 MG tablet TAKE ONE TABLET BY MOUTH TWICE DAILY. 60 tablet 0   No current facility-administered medications for this visit.    Past Medical History  Diagnosis Date  . Hypertension   . GERD (gastroesophageal reflux disease)   . Tachycardia   . Diabetes mellitus without complication   . Fibroid uterus   . Back pain   . Chest pain     Past Surgical History  Procedure Laterality Date  . Wisdom tooth extraction       ROS:  As stated in the HPI and negative for all other systems.  PHYSICAL EXAM BP 152/96 mmHg  Pulse 77  Ht 5\' 4"  (1.626 m)  Wt 180 lb 9.6 oz (81.92 kg)  BMI 30.98 kg/m2  LMP 05/30/2015  GENERAL:  Well appearing NECK:  No jugular venous distention, waveform within normal limits, carotid upstroke brisk and symmetric, no bruits, no thyromegaly LUNGS:  Clear to auscultation bilaterally BACK:  No CVA tenderness CHEST:  Unremarkable HEART:  PMI not displaced or sustained,S1 and S2 within normal limits, no S3, no S4, no clicks, no rubs, no murmurs ABD:  Flat, positive bowel sounds normal in frequency in pitch, no bruits, no rebound, no guarding, no midline pulsatile mass, no hepatomegaly, no splenomegaly EXT:  2 plus pulses throughout, no edema, no cyanosis no clubbing  EKG:  Sinus rhythm, rate 77, axis within normal limits, intervals  within normal limits, no acute ST-T wave changes.  06/29/2015   ASSESSMENT AND PLAN  TACHYCARDIA/PALPITATOINS:  She has lots of anxiety related to this. I had previously given her a prescription for Cardizem CD 120 mg to try but she didn't get this filled. This will be the next step in symptomatically try to treat her palpitations. She'll let me know if she tolerates this. No further workup is planned.

## 2015-06-29 NOTE — Telephone Encounter (Signed)
Called pt again and [facility] for pt explaining that PA for non-preferred strips are not usually approved. Advised best for her to call her ins and find out what meter/strips her plan prefers and we can send in a Rx for these.

## 2015-06-29 NOTE — Telephone Encounter (Signed)
Patients called to schedule her EMG however i don't have a referral up front for her please advise

## 2015-06-29 NOTE — Telephone Encounter (Signed)
i found her referral in the blue folder Brooke Turner is calling her to get her schedule

## 2015-06-29 NOTE — Telephone Encounter (Signed)
Patient requested sooner appt. I left message stating that we have some 79min appts available on Wednesday if she is interested. Left call back number is she wanted to move appt.

## 2015-06-29 NOTE — Telephone Encounter (Signed)
She does need EMG for LUE & LLE.

## 2015-06-30 ENCOUNTER — Telehealth: Payer: Self-pay | Admitting: Neurology

## 2015-06-30 NOTE — Telephone Encounter (Signed)
Patient would like to move her Thursday 8/25 appointment up to today. She said that the nurse told her there was an Optician, dispensing.

## 2015-06-30 NOTE — Telephone Encounter (Signed)
I left message for patient stating that as of the time she called today, all openings have been taken. I have checked all day but nothing has opened. I stated that I would call today before 5pm if anything opened.

## 2015-07-01 NOTE — Telephone Encounter (Signed)
Pt stated she did call her ins and they told her that if we do a PA on the Accu-Chek Plus strips, ins should cover it at no cost. She has not ever tried a different type of meter but has been using this one for 2 years successfully. Completed PA on covermymeds. Pending.

## 2015-07-01 NOTE — Telephone Encounter (Signed)
Opened in error, wrong enc.

## 2015-07-02 ENCOUNTER — Encounter: Payer: Self-pay | Admitting: Neurology

## 2015-07-02 ENCOUNTER — Ambulatory Visit: Payer: Self-pay | Admitting: Neurology

## 2015-07-02 ENCOUNTER — Ambulatory Visit (INDEPENDENT_AMBULATORY_CARE_PROVIDER_SITE_OTHER): Payer: 59 | Admitting: Neurology

## 2015-07-02 VITALS — BP 146/80 | HR 78 | Resp 16 | Ht 64.0 in | Wt 182.0 lb

## 2015-07-02 DIAGNOSIS — G479 Sleep disorder, unspecified: Secondary | ICD-10-CM

## 2015-07-02 DIAGNOSIS — F419 Anxiety disorder, unspecified: Secondary | ICD-10-CM | POA: Diagnosis not present

## 2015-07-02 NOTE — Progress Notes (Signed)
Subjective:    Patient ID: Brooke Turner is a 39 y.o. female.  HPI     Star Age, MD, PhD [facility] [address], [address]O. [place], [place] [address]  Dear Brooke Turner and Brooke Turner,   I saw your patient, Brooke Turner, upon your kind request in my clinic today for discussion of her sleep study results. The patient is unaccompanied today. As you know, Brooke Turner is a 39 year old right-handed woman with an underlying medical history of obesity, reflux disease, diabetes, tachycardia, and recurrent headaches, who had an attended sleep study in January 2016. I went over her test results from 11/18/2014 in detail today. She also had a follow-up appointment with Brooke Turner, during which test results were reviewed. Nevertheless, the patient requested a follow-up in sleep clinic. Her sleep efficiency was 77.9% with a latency to sleep of 49 minutes and wake after sleep onset of 39 minutes with mild to moderate sleep fragmentation noted. She had a normal arousal index. She had an increased percentage of stage II sleep, absence of slow-wave sleep and a mildly decreased percentage of REM sleep at 15% with a normal REM latency. She had mild PLMS at 9.3 per hour, resulting in no significant arousals of only 3.1 per hour. She had no significant EKG, EEG changes. She had intermittent mild to moderate snoring. She had a total AHI of only 0.5 per hour. Average oxygen saturation was 98%, nadir was 93%. For dizziness, presyncopal symptoms and palpitations she has had cardiac workup under Dr. Percival Spanish. She was tried symptomatically on a beta blocker as I understand. She has seen Dr. Melvyn Novas in pulmonology on 05/26/2015 for shortness of breath. As I understand, she does not have an underlying chronic lung disorder. She reports that she had instances where she woke up gasping and in a panic with palpitations. This has not happened recently. She does snore, this  can be loud at times. Her Epworth sleepiness score is 4 out of 24, her fatigue score is 47 out of 63.   Her Past Medical History Is Significant For: Past Medical History  Diagnosis Date  . Hypertension   . GERD (gastroesophageal reflux disease)   . Tachycardia   . Diabetes mellitus without complication   . Fibroid uterus   . Back pain   . Chest pain     Her Past Surgical History Is Significant For: Past Surgical History  Procedure Laterality Date  . Wisdom tooth extraction      Her Family History Is Significant For: Family History  Problem Relation Age of Onset  . Seizures Mother   . Cancer Father     Liver  . Diabetes Father 53  . Hypertension Father   . Heart disease Father 38    CEA, LE [place]  . Alzheimer's disease Maternal Grandmother   . Heart disease Maternal Grandfather     Her Social History Is Significant For: Social History   Social History  . Marital Status: Single    Spouse Name: N/A  . Number of Children: 0  . Years of Education: 16   Occupational History  . Customer Service [employer]   Social History Main Topics  . Smoking status: Former Smoker -- 0.25 packs/day for 10 years    Types: Cigarettes    Quit date: 08/19/2014  . Smokeless tobacco: Never Used  . Alcohol Use: No  . Drug Use: No  . Sexual Activity:    Partners: Male    Birth Control/  Protection: Other-see comments     Comment: NuvaRing   Other Topics Concern  . None   Social History Narrative   Patient lives at home alone .   Patient works full time at [employer]   Education college.   Right handed.   Caffeine none    Her Allergies Are:  Allergies  Allergen Reactions  . Penicillins Hives  . Tamiflu [Oseltamivir Phosphate] Nausea And Vomiting  :   Her Current Medications Are:  Outpatient Encounter Prescriptions as of 07/02/2015  Medication Sig  . ergocalciferol (VITAMIN D2) 50000 UNITS capsule Take 1 capsule (50,000 Units total) by mouth once a week.  .  Ferrous Sulfate (IRON) 325 (65 FE) MG TABS Take 1 tablet by mouth daily.  . fluticasone (FLONASE) 50 MCG/ACT nasal spray Place 1 spray into both nostrils daily as needed for allergies or rhinitis.  Marland Kitchen glucose blood (ACCU-CHEK AVIVA PLUS) test strip Test blood sugar daily.  Marland Kitchen ibuprofen (ADVIL,MOTRIN) 200 MG tablet Take 200 mg by mouth daily as needed for moderate pain.  Marland Kitchen LORazepam (ATIVAN) 0.5 MG tablet Take 1 tablet (0.5 mg total) by mouth 2 (two) times daily as needed for anxiety.  . Norethindrone-Ethinyl Estradiol-Fe Biphas (LO LOESTRIN FE) 1 MG-10 MCG / 10 MCG tablet Take 1 tablet by mouth daily.  Marland Kitchen Propylene Glycol (SYSTANE BALANCE) 0.6 % SOLN Apply to eye. As needed for dry eyes  . ranitidine (ZANTAC) 150 MG tablet TAKE ONE TABLET BY MOUTH TWICE DAILY.  Marland Kitchen diltiazem (DILTIAZEM CD) 120 MG 24 hr capsule Take 1 capsule (120 mg total) by mouth daily. (Patient not taking: Reported on 07/02/2015)   No facility-administered encounter medications on file as of 07/02/2015.  :  Review of Systems:  Out of a complete 14 point review of systems, all are reviewed and negative with the exception of these symptoms as listed below:   Review of Systems  Eyes:       R eye redness and pressure   Neurological:       Had sleep study in 12/2014. Trouble falling asleep at night, some night she reports not sleeping some nights,  wakes up feeling tired in the morning, states that her eyes are red when she gets up, wakes up in the night SOB and heart pounding, daytime tiredness, snoring, morning headaches.     Objective:  Neurologic Exam  Physical Exam Physical Examination:   Filed Vitals:   07/02/15 1117  BP: 146/80  Pulse: 78  Resp: 16   General Examination: The patient is a very pleasant 39 y.o. female in no acute distress. She appears well-developed and well-nourished and very well groomed. She is mildly anxious appearing.  HEENT: Normocephalic, atraumatic, pupils are equal, round and reactive to  light and accommodation. Extraocular tracking is good without limitation to gaze excursion or nystagmus noted. Normal smooth pursuit is noted. Hearing is grossly intact. Face is symmetric with normal facial animation and normal facial sensation. Speech is clear with no dysarthria noted. There is no hypophonia. There is no lip, neck/head, jaw or voice tremor. Neck is supple with full range of passive and active motion. There are no carotid bruits on auscultation. Oropharynx exam reveals: mild mouth dryness, good dental hygiene and mild airway crowding, due to narrow airway entry and tonsils in place, right side more visible. Mallampati is class I. Neck circumference is 14-1/2 inches.  Chest: Clear to auscultation without wheezing, rhonchi or crackles noted.  Heart: S1+S2+0, regular and normal without murmurs, rubs or gallops  noted.   Abdomen: Soft, non-tender and non-distended with normal bowel sounds appreciated on auscultation.  Extremities: There is no pitting edema in the distal lower extremities bilaterally. Pedal pulses are intact.  Skin: Warm and dry without trophic changes noted. There are no varicose veins.  Musculoskeletal: exam reveals no obvious joint deformities, tenderness or joint swelling or erythema.   Neurologically:  Mental status: The patient is awake, alert and oriented in all 4 spheres. Her immediate and remote memory, attention, language skills and fund of knowledge are appropriate. There is no evidence of aphasia, agnosia, apraxia or anomia. Speech is clear with normal prosody and enunciation. Thought process is linear. Mood is normal and affect is normal.  Cranial nerves II - XII are as described above under HEENT exam. In addition: shoulder shrug is normal with equal shoulder height noted. Motor exam: Normal bulk, strength and tone is noted. Reflexes are 2+ throughout.  Sensory exam: intact to light touch Gait, station and balance: She stands easily. No veering to one side  is noted. No leaning to one side is noted. Posture is age-appropriate and stance is narrow based. Gait shows normal stride length and normal pace. No problems turning are noted.  Assessment and Plan:   In summary, Brooke Turner is a very pleasant 39 y.o.-year old female with an underlying medical history of obesity, reflux disease, diabetes, tachycardia, and recurrent headaches, who presents for follow-up consultation after her baseline sleep study in January 2016. I explained the findings to her in detail today. All her questions were answered. She had numerous questions and I reassured her that her sleep study was negative for any significant sleep disordered breathing and she had no significant desaturations. Her AHI was 0.2 per hour which is well within the normal range and her desaturation nadir was 93% which is also considered normal. She had very mild PLMS and no significant arousals from this. At this juncture, she is advised that she has no significant sleep disordered breathing and this is not the explanation for her nocturnal spells of waking up in a panic and with palpitations, feeling anxious and jittery at times. The differential diagnosis certainly does include nocturnal panic disorder. She is encouraged to discuss this with her primary care physician and perhaps consider medication for panic disorder versus a referral to psychiatrist if deemed necessary by her PCP. Furthermore, if she wishes she could seek a second opinion about her sleep with her pulmonologist, Dr. Melvyn Novas. Her physical and neurological exam are nonfocal for me. She is advised to follow-up with you as scheduled. It looks like she is scheduled with Dr. Posey Pronto next month and with Dr.  Delice Lesch in October.  I do not need to see her on a regular basis. I answered all her questions today and the patient was in agreement with the plan.  Thank you very much for allowing me to participate in the care of this nice patient. If I can  be of any further assistance to you please do not hesitate to talk to me.  Sincerely,   Star Age, MD, PhD  I spent 20 minutes in total face-to-face time with the patient, more than 50% of which was spent in counseling and coordination of care, reviewing test results, reviewing medication and discussing or reviewing the diagnosis of sleep disturbances, the prognosis and treatment options.

## 2015-07-02 NOTE — Patient Instructions (Signed)
At this time, you have no sinister sleep disorder, and your sleep study showed no obstructive sleep apnea and good oxygen saturations at night. Nevertheless, you may have night time panic attacks and I encourage you to talk to Dr. Brigitte Pulse about it, perhaps medication and perhaps a referral to psychiatry.  If you wish, you can perhaps seek a second opinion about sleep with your lung doctor, Dr. Melvyn Novas.

## 2015-07-03 ENCOUNTER — Encounter: Payer: Self-pay | Admitting: Cardiology

## 2015-07-06 ENCOUNTER — Telehealth: Payer: Self-pay | Admitting: Cardiology

## 2015-07-06 NOTE — Telephone Encounter (Signed)
LMTCB

## 2015-07-06 NOTE — Telephone Encounter (Signed)
Patient was here last week and was told the palpations she was having were considered an irregular heartbeat.  If that is true, is this the same as having A FIB?

## 2015-07-07 NOTE — Telephone Encounter (Signed)
Left message with patient to call back.

## 2015-07-07 NOTE — Telephone Encounter (Signed)
Pt is returning Mary's call. Please call pt back  Thanks

## 2015-07-08 NOTE — Telephone Encounter (Signed)
Discussed Cardizem usage.  Explained a fib, a flutter, SR, PA and PVC's.  Patient waxing and waning about starting Cardizem or not.  Says she has palpitations several times a day but not to the point that it is seriously bothering her.

## 2015-07-14 ENCOUNTER — Encounter: Payer: 59 | Admitting: Neurology

## 2015-07-14 MED ORDER — BLOOD GLUCOSE METER KIT: PACK | Status: DC

## 2015-07-14 MED ORDER — GLUCOSE BLOOD VI STRP: ORAL_STRIP | Status: DC

## 2015-07-14 NOTE — Telephone Encounter (Signed)
PA denied. Pt needs to try One Touch brand meter/strips. I will send in Rx for new meter and strips.

## 2015-07-14 NOTE — Telephone Encounter (Addendum)
Notified pt on VM, done.

## 2015-07-14 NOTE — Addendum Note (Signed)
Addended by: Elwyn Reach A on: 07/14/2015 10:43 AM   Modules accepted: Orders

## 2015-07-22 ENCOUNTER — Other Ambulatory Visit: Payer: Self-pay

## 2015-07-22 MED ORDER — GLUCOSE BLOOD VI STRP: ORAL_STRIP | Status: DC

## 2015-07-22 MED ORDER — BLOOD GLUCOSE METER KIT: PACK | Status: DC

## 2015-07-22 MED ORDER — ONETOUCH DELICA LANCETS 33G MISC: Status: DC

## 2015-08-03 ENCOUNTER — Other Ambulatory Visit: Payer: Self-pay | Admitting: Physician Assistant

## 2015-08-04 ENCOUNTER — Encounter: Payer: Self-pay | Admitting: Cardiology

## 2015-08-11 ENCOUNTER — Ambulatory Visit: Payer: 59 | Admitting: Neurology

## 2015-08-17 ENCOUNTER — Telehealth: Payer: Self-pay | Admitting: Family Medicine

## 2015-08-17 NOTE — Telephone Encounter (Signed)
Patient faxed a disability form to be completed by Dr. Brigitte Pulse. Please return in 5-7 business days.

## 2015-08-27 NOTE — Telephone Encounter (Signed)
Dr. Brigitte Pulse states pt has to come in to complete. Form placed in FMLA.

## 2015-08-27 NOTE — Telephone Encounter (Signed)
Called patient and left a voicemail stating that Dr. Brigitte Pulse needs her to come in for an OV before she can completed her FMLA paperwork. Will keep the papers in the bottom draw under her last name for whenever she comes in to be seen.

## 2015-09-04 ENCOUNTER — Telehealth: Payer: Self-pay

## 2015-09-04 NOTE — Telephone Encounter (Signed)
Patient is returning a missed phone call from [place] regarding paperwork for her job

## 2015-09-07 NOTE — Telephone Encounter (Signed)
I had not called the patient. See notes under 08/17/15 message. Relayed message on VM again from that enc from Dr Brigitte Pulse that [place] had [facility] about on 10/20.

## 2015-09-08 ENCOUNTER — Ambulatory Visit: Payer: 59 | Admitting: Cardiology

## 2015-09-09 ENCOUNTER — Ambulatory Visit (INDEPENDENT_AMBULATORY_CARE_PROVIDER_SITE_OTHER): Payer: 59 | Admitting: Family Medicine

## 2015-09-09 ENCOUNTER — Ambulatory Visit (INDEPENDENT_AMBULATORY_CARE_PROVIDER_SITE_OTHER): Payer: 59

## 2015-09-09 VITALS — BP 120/80 | HR 85 | Temp 98.6°F | Resp 16 | Ht 63.0 in | Wt 177.0 lb

## 2015-09-09 DIAGNOSIS — R110 Nausea: Secondary | ICD-10-CM | POA: Diagnosis not present

## 2015-09-09 DIAGNOSIS — R0789 Other chest pain: Secondary | ICD-10-CM

## 2015-09-09 DIAGNOSIS — R0781 Pleurodynia: Secondary | ICD-10-CM

## 2015-09-09 DIAGNOSIS — R0600 Dyspnea, unspecified: Secondary | ICD-10-CM

## 2015-09-09 DIAGNOSIS — M79669 Pain in unspecified lower leg: Secondary | ICD-10-CM | POA: Diagnosis not present

## 2015-09-09 LAB — POCT CBC
Granulocyte percent: 43.2 %G (ref 37–80)
HCT, POC: 40.3 % (ref 37.7–47.9)
Hemoglobin: 13.7 g/dL (ref 12.2–16.2)
Lymph, poc: 4.3 — AB (ref 0.6–3.4)
MCH, POC: 30.3 pg (ref 27–31.2)
MCHC: 34 g/dL (ref 31.8–35.4)
MCV: 88.9 fL (ref 80–97)
MID (cbc): 0.5 (ref 0–0.9)
MPV: 6.7 fL (ref 0–99.8)
POC Granulocyte: 3.6 (ref 2–6.9)
POC LYMPH PERCENT: 51.1 %L — AB (ref 10–50)
POC MID %: 5.7 %M (ref 0–12)
Platelet Count, POC: 245 10*3/uL (ref 142–424)
RBC: 4.54 M/uL (ref 4.04–5.48)
RDW, POC: 13 %
WBC: 8.4 10*3/uL (ref 4.6–10.2)

## 2015-09-09 LAB — POCT SEDIMENTATION RATE: POCT SED RATE: 8 mm/hr (ref 0–22)

## 2015-09-09 LAB — D-DIMER, QUANTITATIVE: D-Dimer, Quant: 0.31 ug/mL-FEU (ref 0.00–0.48)

## 2015-09-09 LAB — POCT URINE PREGNANCY: Preg Test, Ur: NEGATIVE

## 2015-09-09 LAB — TROPONIN I: Troponin I: <0.01 ng/mL (ref <0.06)

## 2015-09-09 NOTE — Patient Instructions (Addendum)
Your EKG, chest xray, and blood counts look ok. Other tests were sent - including troponin to look at heart, sed rate to look at inflammation and test for blood clot but I suspect these will be normal. If there is a concern with these tests - I will call you tonight or have the staff call you tomorrow.   Your pain appears to be due to chest wall or from neck pain.  You can try the ibuprofen up to 2 every 4-6 hours, heat and gentle range of motion to neck, and rest. If your pain is worsening overnight, or other symptoms worsening - be seen in the emergency room.   Follow up with Dr. Brigitte Turner to discuss FMLA paperwork.   Return to the clinic or go to the nearest emergency room if any of your symptoms worsen or new symptoms occur.  Nonspecific Chest Pain  Chest pain can be caused by many different conditions. There is always a chance that your pain could be related to something serious, such as a heart attack or a blood clot in your lungs. Chest pain can also be caused by conditions that are not life-threatening. If you have chest pain, it is very important to follow up with your health care provider. CAUSES  Chest pain can be caused by:  Heartburn.  Pneumonia or bronchitis.  Anxiety or stress.  Inflammation around your heart (pericarditis) or lung (pleuritis or pleurisy).  A blood clot in your lung.  A collapsed lung (pneumothorax). It can develop suddenly on its own (spontaneous pneumothorax) or from trauma to the chest.  Shingles infection (varicella-zoster virus).  Heart attack.  Damage to the bones, muscles, and cartilage that make up your chest wall. This can include:  Bruised bones due to injury.  Strained muscles or cartilage due to frequent or repeated coughing or overwork.  Fracture to one or more ribs.  Sore cartilage due to inflammation (costochondritis). RISK FACTORS  Risk factors for chest pain may include:  Activities that increase your risk for trauma or injury to  your chest.  Respiratory infections or conditions that cause frequent coughing.  Medical conditions or overeating that can cause heartburn.  Heart disease or family history of heart disease.  Conditions or health behaviors that increase your risk of developing a blood clot.  Having had chicken pox (varicella zoster). SIGNS AND SYMPTOMS Chest pain can feel like:  Burning or tingling on the surface of your chest or deep in your chest.  Crushing, pressure, aching, or squeezing pain.  Dull or sharp pain that is worse when you move, cough, or take a deep breath.  Pain that is also felt in your back, neck, shoulder, or arm, or pain that spreads to any of these areas. Your chest pain may come and go, or it may stay constant. DIAGNOSIS Lab tests or other studies may be needed to find the cause of your pain. Your health care provider may have you take a test called an ambulatory ECG (electrocardiogram). An ECG records your heartbeat patterns at the time the test is performed. You may also have other tests, such as:  Transthoracic echocardiogram (TTE). During echocardiography, sound waves are used to create a picture of all of the heart structures and to look at how blood flows through your heart.  Transesophageal echocardiogram (TEE).This is a more advanced imaging test that obtains images from inside your body. It allows your health care provider to see your heart in finer detail.  Cardiac monitoring. This allows  your health care provider to monitor your heart rate and rhythm in real time.  Holter monitor. This is a portable device that records your heartbeat and can help to diagnose abnormal heartbeats. It allows your health care provider to track your heart activity for several days, if needed.  Stress tests. These can be done through exercise or by taking medicine that makes your heart beat more quickly.  Blood tests.  Imaging tests. TREATMENT  Your treatment depends on what is  causing your chest pain. Treatment may include:  Medicines. These may include:  Acid blockers for heartburn.  Anti-inflammatory medicine.  Pain medicine for inflammatory conditions.  Antibiotic medicine, if an infection is present.  Medicines to dissolve blood clots.  Medicines to treat coronary artery disease.  Supportive care for conditions that do not require medicines. This may include:  Resting.  Applying heat or cold packs to injured areas.  Limiting activities until pain decreases. HOME CARE INSTRUCTIONS  If you were prescribed an antibiotic medicine, finish it all even if you start to feel better.  Avoid any activities that bring on chest pain.  Do not use any tobacco products, including cigarettes, chewing tobacco, or electronic cigarettes. If you need help quitting, ask your health care provider.  Do not drink alcohol.  Take medicines only as directed by your health care provider.  Keep all follow-up visits as directed by your health care provider. This is important. This includes any further testing if your chest pain does not go away.  If heartburn is the cause for your chest pain, you may be told to keep your head raised (elevated) while sleeping. This reduces the chance that acid will go from your stomach into your esophagus.  Make lifestyle changes as directed by your health care provider. These may include:  Getting regular exercise. Ask your health care provider to suggest some activities that are safe for you.  Eating a heart-healthy diet. A registered dietitian can help you to learn healthy eating options.  Maintaining a healthy weight.  Managing diabetes, if necessary.  Reducing stress. SEEK MEDICAL CARE IF:  Your chest pain does not go away after treatment.  You have a rash with blisters on your chest.  You have a fever. SEEK IMMEDIATE MEDICAL CARE IF:   Your chest pain is worse.  You have an increasing cough, or you cough up  blood.  You have severe abdominal pain.  You have severe weakness.  You faint.  You have chills.  You have sudden, unexplained chest discomfort.  You have sudden, unexplained discomfort in your arms, back, neck, or jaw.  You have shortness of breath at any time.  You suddenly start to sweat, or your skin gets clammy.  You feel nauseous or you vomit.  You suddenly feel light-headed or dizzy.  Your heart begins to beat quickly, or it feels like it is skipping beats. These symptoms may represent a serious problem that is an emergency. Do not wait to see if the symptoms will go away. Get medical help right away. Call your local emergency services (911 in the [place] Do not drive yourself to the hospital.   This information is not intended to replace advice given to you by your health care provider. Make sure you discuss any questions you have with your health care provider.   Document Released: 08/03/2005 Document Revised: 11/14/2014 Document Reviewed: 05/30/2014 [employer] Interactive Patient Education 2016 [employer]   Chest Wall Pain Chest wall pain is pain in or  around the bones and muscles of your chest. Sometimes, an injury causes this pain. Sometimes, the cause may not be known. This pain may take several weeks or longer to get better. HOME CARE INSTRUCTIONS  Pay attention to any changes in your symptoms. Take these actions to help with your pain:   Rest as told by your health care provider.   Avoid activities that cause pain. These include any activities that use your chest muscles or your abdominal and side muscles to lift heavy items.   If directed, apply ice to the painful area:  Put ice in a plastic bag.  Place a towel between your skin and the bag.  Leave the ice on for 20 minutes, 2-3 times per day.  Take over-the-counter and prescription medicines only as told by your health care provider.  Do not use tobacco products, including cigarettes, chewing  tobacco, and e-cigarettes. If you need help quitting, ask your health care provider.  Keep all follow-up visits as told by your health care provider. This is important. SEEK MEDICAL CARE IF:  You have a fever.  Your chest pain becomes worse.  You have new symptoms. SEEK IMMEDIATE MEDICAL CARE IF:  You have nausea or vomiting.  You feel sweaty or light-headed.  You have a cough with phlegm (sputum) or you cough up blood.  You develop shortness of breath.   This information is not intended to replace advice given to you by your health care provider. Make sure you discuss any questions you have with your health care provider.   Document Released: 10/24/2005 Document Revised: 07/15/2015 Document Reviewed: 01/19/2015 [employer] Interactive Patient Education [employer].

## 2015-09-09 NOTE — Progress Notes (Signed)
Subjective:    Patient ID: Brooke Turner, female    DOB: 1976-07-02, 39 y.o.   MRN: [id number] This chart was scribed for Merri Ray, MD by Marti Sleigh, Medical Scribe. This patient was seen in Room 4 and the patient's care was started a 6:33 PM.  Chief Complaint  Patient presents with  . Chest Pain  . FMLA    HPI HPI Comments: Brooke Turner is a 39 y.o. female who presents to [facility] complaining of chest pressure and soreness that is worse when she breaths in or lifts her right arm. Her pain started yesterday around 6pm. She was sitting when the pain began. She is unaware of MOI. States she has some slight tenderness in her chest when she pushes on her left breast. She also has some slight numbness in her left arm that runs into her left hand for the last two hours. She also has had persistent nausea for the last two weeks. She has not vomited. She also states her palpitations have been slightly worse over the last two days. She denies new exercises or weight lifting. 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 She also has some slight SOB, which she has had for the last year, but she states it is slightly worse tonight. She denies cough. She has some increased belching. She denies current heartburn, but takes zantac for heartburn. She denies and recent long distance travel. She states she has had some bilateral mild calf swelling, with associated soreness the last couple of days. She denies new neck pain. She states he had increased anxiety this weekend, but denies increased anxiety today.  She is not taking Cardizem because she was told be her cardiologist to start that medication if her heart palpitations worsened, but they have improved over the last several months so she has not started that medication.  Here for chest pain. She is a usual pt of Dr. Brigitte Pulse. Last visit with dr. Brigitte Pulse appears to be June, 2016 for multiple concerns including atrial tachycardia. She was  advised to start propanolol 52m BID, initially. She was also continued on carvedilol at that time. She was also having labile blood pressures at that time, but normal plasma metanephrines six months prior. Noted to have anxiety with panic sx. She was recommended to start zoloft at that time. She has been sene by cariology, pulmonology and neurology since that time. Most recently cardiology Dr. HPercival Spanish with dizziness, presyncope, and CP on August 28th. It appears she has had a significant workup for her sx with event monitor, normal echo, negative treadmill test. She did not tolerate propanolol due to low heart rates. She was seen for dyspnea by pulmonology in July, 2016. Suspected panic disorder. They did recommend a beta one selective beta blocker if a beta blocker was needed such as bystolic or bisoprolol. She was most recently seen by neurology on August 16th, with normal neurological exam and sleep study that was negative for OSA. At the most recent cardiology visit she was prescribed cardizem CD 1231m Telephone message from august 29, apparently she was waxing and waning about starting cardizem at that time. Phone note for Dr. ShBrigitte Pulsen October 10th requesting FMLA paperwork, but was told she needed to meet with Dr. ShBrigitte Pulseo review this.   Current Outpatient Prescriptions on File Prior to Visit  Medication Sig Dispense Refill  . blood glucose meter kit and supplies Test blood sugar twice daily. Dx code: R73.09 1 each 0  .  diltiazem (DILTIAZEM CD) 120 MG 24 hr capsule Take 1 capsule (120 mg total) by mouth daily. 30 capsule 6  . ergocalciferol (VITAMIN D2) 50000 UNITS capsule Take 1 capsule (50,000 Units total) by mouth once a week. 12 capsule 1  . Ferrous Sulfate (IRON) 325 (65 FE) MG TABS Take 1 tablet by mouth daily.    . fluticasone (FLONASE) 50 MCG/ACT nasal spray Place 1 spray into both nostrils daily as needed for allergies or rhinitis. 16 g 12  . glucose blood test strip Test blood sugar twice  daily. Dx code: R73.09 200 each 3  . ibuprofen (ADVIL,MOTRIN) 200 MG tablet Take 200 mg by mouth daily as needed for moderate pain.    . Norethindrone-Ethinyl Estradiol-Fe Biphas (LO LOESTRIN FE) 1 MG-10 MCG / 10 MCG tablet Take 1 tablet by mouth daily.    [name] DELICA LANCETS 16X MISC Test blood sugar twice daily. Dx code: R73.09 200 each 3  . Propylene Glycol (SYSTANE BALANCE) 0.6 % SOLN Apply to eye. As needed for dry eyes    . ranitidine (ZANTAC) 150 MG tablet TAKE ONE TABLET BY MOUTH TWICE DAILY 60 tablet 3  . LORazepam (ATIVAN) 0.5 MG tablet Take 1 tablet (0.5 mg total) by mouth 2 (two) times daily as needed for anxiety. (Patient not taking: Reported on 09/09/2015) 30 tablet 1   No current facility-administered medications on file prior to visit.    Review of Systems  Constitutional: Negative for fever and chills.  Respiratory: Positive for chest tightness and shortness of breath. Negative for cough.   Cardiovascular: Positive for chest pain and palpitations.  Gastrointestinal: Positive for nausea. Negative for vomiting.  Psychiatric/Behavioral: The patient is not nervous/anxious.       Objective:   Physical Exam  Constitutional: She is oriented to person, place, and time. She appears well-developed and well-nourished. No distress.  HENT:  Head: Normocephalic and atraumatic.  Eyes: Pupils are equal, round, and reactive to light.  Neck: Neck supple.  Cardiovascular: Normal rate, regular rhythm and normal heart sounds.  Exam reveals no gallop and no friction rub.   No murmur heard. Pulmonary/Chest: Effort normal and breath sounds normal. No respiratory distress. She has no wheezes.  Musculoskeletal: Normal range of motion.  On right, calf circumference at 15 cm below the patella is 41cm. On left, calf circumference at 15 cm below the patella is 40cm. Reproduces chest pressure with neck rotation, palpation of left upper chest, and with arm raise, resisted pectoralis testing.  Upper extremity strength and grip strength equal. Reflexes at biceps, triceps, and brachioradialis.   Neurological: She is alert and oriented to person, place, and time. Coordination normal.  Skin: Skin is warm and dry. She is not diaphoretic.  Psychiatric: She has a normal mood and affect. Her behavior is normal.  Nursing note and vitals reviewed.   Filed Vitals:   09/09/15 1746  BP: 120/80  Pulse: 85  Temp: 98.6 F (37 C)  TempSrc: Oral  Resp: 16  Height: 5' 3"  (1.6 m)  Weight: 177 lb (80.287 kg)  SpO2: 99%   [facility] reading (PRIMARY) by  Dr. Carlota Raspberry. Chest x-ray: no acute findings, no acute infiltrates.      Assessment & Plan:   Brooke Turner is a 39 y.o. female Left-sided chest wall pain  Calf pain, unspecified laterality - Plan: D-dimer, quantitative (not at [facility])  Pleuritic chest pain - Plan: POCT CBC, POCT SEDIMENTATION RATE, D-dimer, quantitative (not at [facility]), Troponin I, EKG 12-Lead, DG  Chest 2 View  Dyspnea - Plan: DG Chest 2 View  Nausea without vomiting - Plan: POCT urine pregnancy  L sided chest pain reproducible on exam.  Has had history of chest pain and palpitations in past with workup by cardiology. Intermittent nausea and dyspnea, recent hx of bilateral calf pain, but no edema appreciated. EKG, CXR, CBC in office reassuring.   -check D dimer, but less likely DVT/PE with reproducible pain.   -check ESR for pericarditis, but less likely  -troponin as sx's present since day prior, but prior cardiac workup reassuring.   -trial of ibuprofen, heat and gentle rom of neck as cervical source possible.   -rtc/ER chest pain precautions reviewed.   No orders of the defined types were placed in this encounter.   Patient Instructions  Your EKG, chest xray, and blood counts look ok. Other tests were sent - including troponin to look at heart, sed rate to look at inflammation and test for blood clot but I suspect these will be normal. If there is a concern with  these tests - I will call you tonight or have the staff call you tomorrow.   Your pain appears to be due to chest wall or from neck pain.  You can try the ibuprofen up to 2 every 4-6 hours, heat and gentle range of motion to neck, and rest. If your pain is worsening overnight, or other symptoms worsening - be seen in the emergency room.   Follow up with Dr. Brigitte Pulse to discuss FMLA paperwork.   Return to the clinic or go to the nearest emergency room if any of your symptoms worsen or new symptoms occur.  Nonspecific Chest Pain  Chest pain can be caused by many different conditions. There is always a chance that your pain could be related to something serious, such as a heart attack or a blood clot in your lungs. Chest pain can also be caused by conditions that are not life-threatening. If you have chest pain, it is very important to follow up with your health care provider. CAUSES  Chest pain can be caused by:  Heartburn.  Pneumonia or bronchitis.  Anxiety or stress.  Inflammation around your heart (pericarditis) or lung (pleuritis or pleurisy).  A blood clot in your lung.  A collapsed lung (pneumothorax). It can develop suddenly on its own (spontaneous pneumothorax) or from trauma to the chest.  Shingles infection (varicella-zoster virus).  Heart attack.  Damage to the bones, muscles, and cartilage that make up your chest wall. This can include:  Bruised bones due to injury.  Strained muscles or cartilage due to frequent or repeated coughing or overwork.  Fracture to one or more ribs.  Sore cartilage due to inflammation (costochondritis). RISK FACTORS  Risk factors for chest pain may include:  Activities that increase your risk for trauma or injury to your chest.  Respiratory infections or conditions that cause frequent coughing.  Medical conditions or overeating that can cause heartburn.  Heart disease or family history of heart disease.  Conditions or health behaviors  that increase your risk of developing a blood clot.  Having had chicken pox (varicella zoster). SIGNS AND SYMPTOMS Chest pain can feel like:  Burning or tingling on the surface of your chest or deep in your chest.  Crushing, pressure, aching, or squeezing pain.  Dull or sharp pain that is worse when you move, cough, or take a deep breath.  Pain that is also felt in your back, neck, shoulder, or arm,  or pain that spreads to any of these areas. Your chest pain may come and go, or it may stay constant. DIAGNOSIS Lab tests or other studies may be needed to find the cause of your pain. Your health care provider may have you take a test called an ambulatory ECG (electrocardiogram). An ECG records your heartbeat patterns at the time the test is performed. You may also have other tests, such as:  Transthoracic echocardiogram (TTE). During echocardiography, sound waves are used to create a picture of all of the heart structures and to look at how blood flows through your heart.  Transesophageal echocardiogram (TEE).This is a more advanced imaging test that obtains images from inside your body. It allows your health care provider to see your heart in finer detail.  Cardiac monitoring. This allows your health care provider to monitor your heart rate and rhythm in real time.  Holter monitor. This is a portable device that records your heartbeat and can help to diagnose abnormal heartbeats. It allows your health care provider to track your heart activity for several days, if needed.  Stress tests. These can be done through exercise or by taking medicine that makes your heart beat more quickly.  Blood tests.  Imaging tests. TREATMENT  Your treatment depends on what is causing your chest pain. Treatment may include:  Medicines. These may include:  Acid blockers for heartburn.  Anti-inflammatory medicine.  Pain medicine for inflammatory conditions.  Antibiotic medicine, if an infection is  present.  Medicines to dissolve blood clots.  Medicines to treat coronary artery disease.  Supportive care for conditions that do not require medicines. This may include:  Resting.  Applying heat or cold packs to injured areas.  Limiting activities until pain decreases. HOME CARE INSTRUCTIONS  If you were prescribed an antibiotic medicine, finish it all even if you start to feel better.  Avoid any activities that bring on chest pain.  Do not use any tobacco products, including cigarettes, chewing tobacco, or electronic cigarettes. If you need help quitting, ask your health care provider.  Do not drink alcohol.  Take medicines only as directed by your health care provider.  Keep all follow-up visits as directed by your health care provider. This is important. This includes any further testing if your chest pain does not go away.  If heartburn is the cause for your chest pain, you may be told to keep your head raised (elevated) while sleeping. This reduces the chance that acid will go from your stomach into your esophagus.  Make lifestyle changes as directed by your health care provider. These may include:  Getting regular exercise. Ask your health care provider to suggest some activities that are safe for you.  Eating a heart-healthy diet. A registered dietitian can help you to learn healthy eating options.  Maintaining a healthy weight.  Managing diabetes, if necessary.  Reducing stress. SEEK MEDICAL CARE IF:  Your chest pain does not go away after treatment.  You have a rash with blisters on your chest.  You have a fever. SEEK IMMEDIATE MEDICAL CARE IF:   Your chest pain is worse.  You have an increasing cough, or you cough up blood.  You have severe abdominal pain.  You have severe weakness.  You faint.  You have chills.  You have sudden, unexplained chest discomfort.  You have sudden, unexplained discomfort in your arms, back, neck, or jaw.  You  have shortness of breath at any time.  You suddenly start to sweat,  or your skin gets clammy.  You feel nauseous or you vomit.  You suddenly feel light-headed or dizzy.  Your heart begins to beat quickly, or it feels like it is skipping beats. These symptoms may represent a serious problem that is an emergency. Do not wait to see if the symptoms will go away. Get medical help right away. Call your local emergency services (911 in the [place] Do not drive yourself to the hospital.   This information is not intended to replace advice given to you by your health care provider. Make sure you discuss any questions you have with your health care provider.   Document Released: 08/03/2005 Document Revised: 11/14/2014 Document Reviewed: 05/30/2014 Elsevier Interactive Patient Education 2016 [employer]   Chest Wall Pain Chest wall pain is pain in or around the bones and muscles of your chest. Sometimes, an injury causes this pain. Sometimes, the cause may not be known. This pain may take several weeks or longer to get better. HOME CARE INSTRUCTIONS  Pay attention to any changes in your symptoms. Take these actions to help with your pain:   Rest as told by your health care provider.   Avoid activities that cause pain. These include any activities that use your chest muscles or your abdominal and side muscles to lift heavy items.   If directed, apply ice to the painful area:  Put ice in a plastic bag.  Place a towel between your skin and the bag.  Leave the ice on for 20 minutes, 2-3 times per day.  Take over-the-counter and prescription medicines only as told by your health care provider.  Do not use tobacco products, including cigarettes, chewing tobacco, and e-cigarettes. If you need help quitting, ask your health care provider.  Keep all follow-up visits as told by your health care provider. This is important. SEEK MEDICAL CARE IF:  You have a fever.  Your chest pain becomes  worse.  You have new symptoms. SEEK IMMEDIATE MEDICAL CARE IF:  You have nausea or vomiting.  You feel sweaty or light-headed.  You have a cough with phlegm (sputum) or you cough up blood.  You develop shortness of breath.   This information is not intended to replace advice given to you by your health care provider. Make sure you discuss any questions you have with your health care provider.   Document Released: 10/24/2005 Document Revised: 07/15/2015 Document Reviewed: 01/19/2015 Elsevier Interactive Patient Education [employer].     I personally performed the services described in this documentation, which was scribed in my presence. The recorded information has been reviewed and considered, and addended by me as needed.    By signing my name below, I, Judithe Modest, attest that this documentation has been prepared under the direction and in the presence of Merri Ray, MD. Electronically Signed: Judithe Modest, ER Scribe. 09/09/2015. 6:34 PM.

## 2015-09-10 ENCOUNTER — Ambulatory Visit: Payer: 59 | Admitting: Cardiology

## 2015-09-14 ENCOUNTER — Ambulatory Visit (INDEPENDENT_AMBULATORY_CARE_PROVIDER_SITE_OTHER): Payer: 59 | Admitting: Family Medicine

## 2015-09-14 VITALS — BP 138/90 | HR 85 | Temp 98.3°F | Resp 20 | Ht 62.5 in | Wt 175.4 lb

## 2015-09-14 DIAGNOSIS — R002 Palpitations: Secondary | ICD-10-CM | POA: Diagnosis not present

## 2015-09-14 DIAGNOSIS — R55 Syncope and collapse: Secondary | ICD-10-CM | POA: Diagnosis not present

## 2015-09-14 DIAGNOSIS — F4310 Post-traumatic stress disorder, unspecified: Secondary | ICD-10-CM | POA: Diagnosis not present

## 2015-09-14 DIAGNOSIS — F410 Panic disorder [episodic paroxysmal anxiety] without agoraphobia: Secondary | ICD-10-CM

## 2015-09-14 DIAGNOSIS — F418 Other specified anxiety disorders: Secondary | ICD-10-CM | POA: Diagnosis not present

## 2015-09-14 DIAGNOSIS — F064 Anxiety disorder due to known physiological condition: Secondary | ICD-10-CM

## 2015-09-14 DIAGNOSIS — R4589 Other symptoms and signs involving emotional state: Secondary | ICD-10-CM

## 2015-09-14 DIAGNOSIS — I471 Supraventricular tachycardia: Secondary | ICD-10-CM

## 2015-09-14 DIAGNOSIS — I4719 Other supraventricular tachycardia: Secondary | ICD-10-CM

## 2015-09-14 NOTE — Progress Notes (Addendum)
Subjective:  This chart was scribed for [name], MD by Thea Alken, ED Scribe. This patient was seen in room 13 and the patient's care was started at 7:24 PM.   Patient ID: Brooke Turner, female    DOB: 20-Mar-1976, 39 y.o.   MRN: [id number]  HPI   Chief Complaint  Patient presents with  . Follow-up    here to see Dr. Brigitte Pulse to review FMLA paper work   HPI Comments: Brooke Turner is a 39 y.o. female who presents to the [facility] for a follow up.   Saw a 3rd neurologist who after detailed sleep eval advised that she may have nocturnal panic disorder. Encouraged to consider medication for panic disorder vs a psychiatry referral. Was advised if any desire for 2nd opinion regarding sleep symptoms f/u with [facility]    Weight loss Pt is doing better. She has lost weight, about 40lbs within the past year. She has been conscience of what she eats including salads and is working out. Workout consist of elliptical, free weights, ab machine and leg machines.   Palpitation Pt still has occasional palpitation of her heart racing and pound, but not as often. She was prescribed Diltiazem but has not needed medication because palpitations have subsided. States a couple weeks ago she was at party but began to have palpitation dizziness, nausea and immediately left. States the environment was filled with marijuana smoke and that she had 1 beer, possibly contributing to symptoms. It took about 15 minutes to calm down while talking to a friend.   She was seen by a therapist at the treatment center in [place] but is looking for another therapist, possibly [place].  She is still going to integrated therapy and has found this helpful. She has been learning how to relax and breathing techniques.   Trouble sleeping She still wakes up with palpitation but not as often.  She wakes up couple times a week. Pt tries to relax at night prior to bed or read to  help with sleep but her mind tends to race. She goes to sleep after 12am and does not get a full 8 hours of sleep. Pt rarely takes ativan, last dose was a half tablet about 1.5 months ago and states medication made her dizzy. Prior to taking Atvian 1.5 months ago she hadn't  taken it for 6 months. Has not tried other sleep aid. Pt is considering yoga.   Allergies  Has had nasal drainage. Wakes up at night with a large amount of drainage. She is not taking antihistamines.   Blood pressure She is concerned about her BP in office today which is 138/90. She does not check BP regularly at home. Last obtained home reading was 3 weeks ago and was in the 120's.  Diabetes  Lab Results  Component Value Date   HGBA1C 6.0* 03/08/2015   She does not check sugar regularly.   Medications currently taking Zantac Birth control  Ibuprofen 248m  for occasional chest wall pain. Has not helped much. She has Diltiazem at but only will take if needed.  Vitamin D x once a week  Needs FMLA paperwork filled out.   Past Medical History  Diagnosis Date  . Hypertension   . GERD (gastroesophageal reflux disease)   . Tachycardia   . Diabetes mellitus [place]   . Fibroid uterus   . Back pain   . Chest pain    Allergies  Allergen Reactions  . Penicillins Hives  . Tamiflu [Oseltamivir Phosphate] Nausea And Vomiting  . Tinidazole     Caused severe abd pain/type of colitis    Prior to Admission medications   Medication Sig Start Date End Date Taking? Authorizing Provider  blood glucose meter kit and supplies Test blood sugar twice daily. Dx code: R73.09 07/22/15  Yes [name], MD  diltiazem (DILTIAZEM CD) 120 MG 24 hr capsule Take 1 capsule (120 mg total) by mouth daily. 06/29/15  Yes Minus Breeding, MD  ergocalciferol (VITAMIN D2) 50000 UNITS capsule Take 1 capsule (50,000 Units total) by mouth once a week. 03/12/15  Yes [name], MD  Ferrous Sulfate (IRON) 325 (65 FE) MG TABS Take 1  tablet by mouth daily.   Yes Historical Provider, MD  fluticasone (FLONASE) 50 MCG/ACT nasal spray Place 1 spray into both nostrils daily as needed for allergies or rhinitis. 10/15/14  Yes Chelle Jeffery, PA-C  glucose blood test strip Test blood sugar twice daily. Dx code: R73.09 07/22/15  Yes [name], MD  ibuprofen (ADVIL,MOTRIN) 200 MG tablet Take 200 mg by mouth daily as needed for moderate pain.   Yes Historical Provider, MD  LORazepam (ATIVAN) 0.5 MG tablet Take 1 tablet (0.5 mg total) by mouth 2 (two) times daily as needed for anxiety. 12/15/14  Yes [name], MD  Norethindrone-Ethinyl Estradiol-Fe Biphas (LO LOESTRIN FE) 1 MG-10 MCG / 10 MCG tablet Take 1 tablet by mouth daily.   Yes Historical Provider, MD  [facility] DELICA LANCETS 35W MISC Test blood sugar twice daily. Dx code: R73.09 07/22/15  Yes [name], MD  Propylene Glycol (SYSTANE BALANCE) 0.6 % SOLN Apply to eye. As needed for dry eyes   Yes Historical Provider, MD  ranitidine (ZANTAC) 150 MG tablet TAKE ONE TABLET BY MOUTH TWICE DAILY 08/04/15  Yes Gay Filler Copland, MD   Review of Systems  Constitutional: Positive for activity change and appetite change. Negative for fever, chills, diaphoresis, fatigue and unexpected weight change.  HENT: Positive for congestion.   Respiratory: Positive for chest tightness and shortness of breath. Negative for cough and wheezing.   Cardiovascular: Positive for chest pain and palpitations. Negative for leg swelling.  Gastrointestinal: Positive for nausea. Negative for vomiting, abdominal pain, diarrhea, constipation, blood in stool, abdominal distention and anal bleeding.  Allergic/Immunologic: Positive for environmental allergies. Negative for immunocompromised state.  Neurological: Positive for dizziness, light-headedness and headaches.  Psychiatric/Behavioral: Positive for behavioral problems, sleep disturbance and agitation. Negative for suicidal ideas, hallucinations, self-injury, dysphoric  mood and decreased concentration. The patient is nervous/anxious. The patient is not hyperactive.     Objective:   Physical Exam  Constitutional: She is oriented to person, place, and time. She appears well-developed and well-nourished. No distress.  HENT:  Head: Normocephalic and atraumatic.  Eyes: Conjunctivae and EOM are normal.  Neck: Neck supple. No thyromegaly present.  Cardiovascular: Normal rate, regular rhythm, S1 normal, S2 normal and normal heart sounds.  Exam reveals no gallop and no friction rub.   No murmur heard. BP recheck- 126/85  Pulmonary/Chest: Effort normal and breath sounds normal. No respiratory distress. She has no wheezes. She has no rales.  Musculoskeletal: Normal range of motion.  Lymphadenopathy:    She has no cervical adenopathy.  Neurological: She is alert and oriented to person, place, and time.  Skin: Skin is warm and dry.  Psychiatric: She has a normal mood and affect. Her behavior is normal.  Nursing note  and vitals reviewed.  Filed Vitals:   09/14/15 1819  BP: 138/90  Pulse: 85  Temp: 98.3 F (36.8 C)  TempSrc: Oral  Resp: 20  Height: 5' 2.5" (1.588 m)  Weight: 175 lb 6 oz (79.55 kg)  SpO2: 99%    Assessment & Plan:  Pre-syncope  Palpitations  Post-traumatic stress reaction  Anxiety disorder due to general medical condition with panic attack  Anxiety about health  Atrial tachycardia, paroxysmal (HCC)  Pt is slowly making progress. Cont with therapist. FMLA forms completed and copy scanned in.   Over 40 min spent in face-to-face evaluation of and consultation with patient and coordination of care.  Over 50% of this time was spent counseling this patient.  I personally performed the services described in this documentation, which was scribed in my presence. The recorded information has been reviewed and considered, and addended by me as needed.  [name], MD MPH

## 2015-09-15 NOTE — Telephone Encounter (Signed)
Patient notified via VM that her paperwork has been completed and faxed.

## 2015-09-22 ENCOUNTER — Ambulatory Visit (INDEPENDENT_AMBULATORY_CARE_PROVIDER_SITE_OTHER): Payer: 59 | Admitting: Cardiology

## 2015-09-22 ENCOUNTER — Encounter: Payer: Self-pay | Admitting: Cardiology

## 2015-09-22 VITALS — BP 162/84 | HR 82 | Ht 64.0 in | Wt 178.6 lb

## 2015-09-22 DIAGNOSIS — I10 Essential (primary) hypertension: Secondary | ICD-10-CM | POA: Diagnosis not present

## 2015-09-22 NOTE — Progress Notes (Signed)
HPI The patient presents for evaluation of dizziness and presyncope as well as chest pain. She has had an extensive workup for palpitations. After the last visit I did give her a prescription for Cardizem but she did not start taking this because she's no longer having symptoms other than rare palpitations. She denies any chest pressure or neck or arm discomfort. She's been exercising.  Allergies  Allergen Reactions  . Penicillins Hives  . Tamiflu [Oseltamivir Phosphate] Nausea And Vomiting  . Tinidazole     Caused severe abd pain/type of colitis     Current Outpatient Prescriptions  Medication Sig Dispense Refill  . blood glucose meter kit and supplies Test blood sugar twice daily. Dx code: R73.09 1 each 0  . ergocalciferol (VITAMIN D2) 50000 UNITS capsule Take 1 capsule (50,000 Units total) by mouth once a week. 12 capsule 1  . Ferrous Sulfate (IRON) 325 (65 FE) MG TABS Take 1 tablet by mouth daily.    . fluticasone (FLONASE) 50 MCG/ACT nasal spray Place 1 spray into both nostrils daily as needed for allergies or rhinitis. 16 g 12  . glucose blood test strip Test blood sugar twice daily. Dx code: R73.09 200 each 3  . ibuprofen (ADVIL,MOTRIN) 200 MG tablet Take 200 mg by mouth daily as needed for moderate pain.    Marland Kitchen LORazepam (ATIVAN) 0.5 MG tablet Take 1 tablet (0.5 mg total) by mouth 2 (two) times daily as needed for anxiety. 30 tablet 1  . Norethindrone-Ethinyl Estradiol-Fe Biphas (LO LOESTRIN FE) 1 MG-10 MCG / 10 MCG tablet Take 1 tablet by mouth daily.    [name] DELICA LANCETS 09B MISC Test blood sugar twice daily. Dx code: R73.09 200 each 3  . Propylene Glycol (SYSTANE BALANCE) 0.6 % SOLN Apply to eye. As needed for dry eyes    . ranitidine (ZANTAC) 150 MG tablet TAKE ONE TABLET BY MOUTH TWICE DAILY 60 tablet 3   No current facility-administered medications for this visit.    Past Medical History  Diagnosis Date  . Hypertension   . GERD (gastroesophageal reflux disease)    . Tachycardia   . Diabetes mellitus without complication ([place])   . Fibroid uterus   . Back pain   . Chest pain     Past Surgical History  Procedure Laterality Date  . Wisdom tooth extraction       ROS:  As stated in the HPI and negative for all other systems.  PHYSICAL EXAM BP 162/84 mmHg  Pulse 82  Ht 5' 4" (1.626 m)  Wt 178 lb 9 oz (80.995 kg)  BMI 30.63 kg/m2  LMP 08/19/2015  GENERAL:  Well appearing NECK:  No jugular venous distention, waveform within normal limits, carotid upstroke brisk and symmetric, no bruits, no thyromegaly LUNGS:  Clear to auscultation bilaterally BACK:  No CVA tenderness CHEST:  Unremarkable HEART:  PMI not displaced or sustained,S1 and S2 within normal limits, no S3, no S4, no clicks, no rubs, no murmurs ABD:  Flat, positive bowel sounds normal in frequency in pitch, no bruits, no rebound, no guarding, no midline pulsatile mass, no hepatomegaly, no splenomegaly EXT:  2 plus pulses throughout, no edema, no cyanosis no clubbing   ASSESSMENT AND PLAN  TACHYCARDIA/PALPITATIONS:  She has no new symptoms. No change in therapy is indicated. She'll start taking Cardizem if she has increased palpitations.  HTN:  She is going to keep a two-week blood pressure diary and let me review these results of which point we'll  decide on further therapy.

## 2015-09-22 NOTE — Patient Instructions (Signed)
Your physician wants you to follow-up in: 6 Months You will receive a reminder letter in the mail two months in advance. If you don't receive a letter, please call our office to schedule the follow-up appointment.  

## 2015-10-13 ENCOUNTER — Telehealth: Payer: Self-pay | Admitting: Pharmacist Clinician (PhC)/ Clinical Pharmacy Specialist

## 2015-10-13 NOTE — Telephone Encounter (Signed)
Pt of Dr, Percival Spanish, brought her home BP cuff to the office to verify its accuracy.  Has a newer [employer] model.  Her cuff read at 164/93 with HR of 102, whereas manually I got 168/88 and a HR of 100.  Her home BP recording should be considered accurate.

## 2015-10-19 ENCOUNTER — Telehealth: Payer: Self-pay | Admitting: Cardiology

## 2015-10-19 ENCOUNTER — Emergency Department (HOSPITAL_COMMUNITY): Payer: 59

## 2015-10-19 ENCOUNTER — Emergency Department (HOSPITAL_COMMUNITY)
Admission: EM | Admit: 2015-10-19 | Discharge: 2015-10-19 | Disposition: A | Discharge status: Home / Self Care | Payer: 59 | Attending: Emergency Medicine | Admitting: Emergency Medicine

## 2015-10-19 ENCOUNTER — Encounter (HOSPITAL_COMMUNITY): Payer: Self-pay

## 2015-10-19 DIAGNOSIS — Z793 Long term (current) use of hormonal contraceptives: Secondary | ICD-10-CM | POA: Diagnosis not present

## 2015-10-19 DIAGNOSIS — R002 Palpitations: Secondary | ICD-10-CM | POA: Diagnosis present

## 2015-10-19 DIAGNOSIS — Z3202 Encounter for pregnancy test, result negative: Secondary | ICD-10-CM | POA: Insufficient documentation

## 2015-10-19 DIAGNOSIS — Z87891 Personal history of nicotine dependence: Secondary | ICD-10-CM | POA: Insufficient documentation

## 2015-10-19 DIAGNOSIS — R000 Tachycardia, unspecified: Secondary | ICD-10-CM | POA: Diagnosis not present

## 2015-10-19 DIAGNOSIS — K219 Gastro-esophageal reflux disease without esophagitis: Secondary | ICD-10-CM | POA: Diagnosis not present

## 2015-10-19 DIAGNOSIS — Z86018 Personal history of other benign neoplasm: Secondary | ICD-10-CM | POA: Diagnosis not present

## 2015-10-19 DIAGNOSIS — Z79899 Other long term (current) drug therapy: Secondary | ICD-10-CM | POA: Diagnosis not present

## 2015-10-19 DIAGNOSIS — E876 Hypokalemia: Secondary | ICD-10-CM

## 2015-10-19 DIAGNOSIS — F419 Anxiety disorder, unspecified: Secondary | ICD-10-CM | POA: Diagnosis not present

## 2015-10-19 DIAGNOSIS — Z880 Allergy status to penicillin: Secondary | ICD-10-CM | POA: Insufficient documentation

## 2015-10-19 DIAGNOSIS — E119 Type 2 diabetes mellitus without complications: Secondary | ICD-10-CM | POA: Diagnosis not present

## 2015-10-19 DIAGNOSIS — I10 Essential (primary) hypertension: Secondary | ICD-10-CM | POA: Diagnosis not present

## 2015-10-19 LAB — CBC WITH DIFFERENTIAL/PLATELET
Basophils Absolute: 0 10*3/uL (ref 0.0–0.1)
Basophils Relative: 0 %
Eosinophils Absolute: 0.2 10*3/uL (ref 0.0–0.7)
Eosinophils Relative: 1 %
HCT: 41.9 % (ref 36.0–46.0)
Hemoglobin: 14.4 g/dL (ref 12.0–15.0)
Lymphocytes Relative: 49 %
Lymphs Abs: 8.2 10*3/uL — ABNORMAL HIGH (ref 0.7–4.0)
MCH: 30.7 pg (ref 26.0–34.0)
MCHC: 34.4 g/dL (ref 30.0–36.0)
MCV: 89.3 fL (ref 78.0–100.0)
Monocytes Absolute: 1.3 10*3/uL — ABNORMAL HIGH (ref 0.1–1.0)
Monocytes Relative: 8 %
Neutro Abs: 7.1 10*3/uL (ref 1.7–7.7)
Neutrophils Relative %: 42 %
Platelets: 311 10*3/uL (ref 150–400)
RBC: 4.69 MIL/uL (ref 3.87–5.11)
RDW: 12.6 % (ref 11.5–15.5)
WBC: 16.8 10*3/uL — ABNORMAL HIGH (ref 4.0–10.5)

## 2015-10-19 LAB — I-STAT TROPONIN, ED: Troponin i, poc: 0 ng/mL (ref 0.00–0.08)

## 2015-10-19 LAB — RAPID URINE DRUG SCREEN, HOSP PERFORMED
Amphetamines: NOT DETECTED
Barbiturates: NOT DETECTED
Benzodiazepines: NOT DETECTED
Cocaine: NOT DETECTED
Opiates: NOT DETECTED
Tetrahydrocannabinol: NOT DETECTED

## 2015-10-19 LAB — I-STAT CHEM 8, ED
BUN: 10 mg/dL (ref 6–20)
Calcium, Ion: 1.09 mmol/L — ABNORMAL LOW (ref 1.12–1.23)
Chloride: 101 mmol/L (ref 101–111)
Creatinine, Ser: 0.9 mg/dL (ref 0.44–1.00)
Glucose, Bld: 158 mg/dL — ABNORMAL HIGH (ref 65–99)
HCT: 46 % (ref 36.0–46.0)
Hemoglobin: 15.6 g/dL — ABNORMAL HIGH (ref 12.0–15.0)
Potassium: 2.7 mmol/L — CL (ref 3.5–5.1)
Sodium: 141 mmol/L (ref 135–145)
TCO2: 24 mmol/L (ref 0–100)

## 2015-10-19 LAB — I-STAT BETA HCG BLOOD, ED (MC, WL, AP ONLY): I-stat hCG, quantitative: <5 m[IU]/mL (ref <5)

## 2015-10-19 LAB — D-DIMER, QUANTITATIVE: D-Dimer, Quant: <0.27 ug/mL-FEU (ref 0.00–0.50)

## 2015-10-19 LAB — PATHOLOGIST SMEAR REVIEW

## 2015-10-19 LAB — CBG MONITORING, ED: Glucose-Capillary: 161 mg/dL — ABNORMAL HIGH (ref 65–99)

## 2015-10-19 MED ORDER — SODIUM CHLORIDE 0.9 % IV BOLUS (SEPSIS)
1000.0000 mL | Freq: Once | INTRAVENOUS | Status: AC
  Administered 2015-10-19: 1000 mL via INTRAVENOUS

## 2015-10-19 MED ORDER — POTASSIUM CHLORIDE CRYS ER 20 MEQ PO TBCR: 20.0000 meq | EXTENDED_RELEASE_TABLET | Freq: Two times a day (BID) | ORAL | Status: DC

## 2015-10-19 MED ORDER — POTASSIUM CHLORIDE CRYS ER 20 MEQ PO TBCR
80.0000 meq | EXTENDED_RELEASE_TABLET | Freq: Once | ORAL | Status: AC
  Administered 2015-10-19: 80 meq via ORAL
  Filled 2015-10-19: qty 4

## 2015-10-19 NOTE — Telephone Encounter (Signed)
Pt discussed some concerns/anxiety r/t occurrence last night. Mainly wanted to discuss what to do if her BP spikes again. Advised BP log BID this week, present to appt on Friday as scheduled. Confirmed time/loc/provider for visit. Pt aware to call if further concerns/new symptoms.

## 2015-10-19 NOTE — Telephone Encounter (Signed)
Brooke Turner is calling because she went to the E/ R on this morning , because her heart was racing. Bp was high . Please call . She has an appt on Friday w/ Tarri Fuller .  Thanks

## 2015-10-19 NOTE — ED Notes (Signed)
Patient transported to X-ray 

## 2015-10-19 NOTE — ED Provider Notes (Signed)
CSN: [id number]     Arrival date & time 10/19/15  1683 History  By signing my name below, I, Helane Gunther, attest that this documentation has been prepared under the direction and in the presence of Yeraldine Forney, MD. Electronically Signed: Helane Gunther, ED Scribe. 10/19/2015. 3:50 AM.    Chief Complaint  Patient presents with  . Palpitations   Patient is a 39 y.o. female presenting with palpitations. The history is provided by the patient. No language interpreter was used.  Palpitations Palpitations quality:  Regular Onset quality:  Sudden Timing:  Constant Progression:  Unchanged Chronicity:  Recurrent Context: not caffeine and not illicit drugs   Relieved by:  Nothing Worsened by:  Nothing Ineffective treatments:  None tried Associated symptoms: no chest pain, no lower extremity edema and no syncope   Risk factors: no diabetes mellitus, no hx of atrial fibrillation, no hx of DVT, no hx of PE, no hx of thyroid disease, no hypercoagulable state, no hyperthyroidism and no OTC sinus medications    HPI Comments: Brooke Turner is a 39 y.o. female who presents to the Emergency Department complaining of palpitations onset 1 hour ago while lying down. She reports associated SOB onset with the palpitations, and HA, dizziness, and lightheadedness as of 3 days ago. She notes she was seen by her physical therapist 3 days ago for her mid- and upper back pain, where she also c/o the associated symptoms. She reports a PMHx of panic attacks, but notes she was never on any medication for palpitations. She notes she is on ranitidine and birth control. She reports that her other medications are to be taken on an as-needed basis. She states she was borderline diabetic, but was able to resolve it through proper diet and exercise. She notes her LNMP was last week, and notes she skipped a month before that. She deneis any recent long drives or travel. Pt denies leg swelling and hemoptysis.   Past  Medical History  Diagnosis Date  . Hypertension   . GERD (gastroesophageal reflux disease)   . Tachycardia   . Diabetes mellitus without complication ([place])   . Fibroid uterus   . Back pain   . Chest pain    Past Surgical History  Procedure Laterality Date  . Wisdom tooth extraction     Family History  Problem Relation Age of Onset  . Seizures Mother   . Cancer Father     Liver  . Diabetes Father 73  . Hypertension Father   . Heart disease Father 67    CEA, LE [place]  . Alzheimer's disease Maternal Grandmother   . Heart disease Maternal Grandfather    Social History  Substance Use Topics  . Smoking status: Former Smoker -- 0.25 packs/day for 10 years    Types: Cigarettes    Quit date: 08/19/2014  . Smokeless tobacco: Never Used  . Alcohol Use: No   OB History    Gravida Para Term Preterm AB TAB SAB Ectopic Multiple Living   2 0 0 0 2 1 1 0 0 0      Review of Systems  Cardiovascular: Positive for palpitations. Negative for chest pain and syncope.  Neurological: Positive for light-headedness and headaches.  All other systems reviewed and are negative.   Allergies  Penicillins; Tamiflu; and Tinidazole  Home Medications   Prior to Admission medications   Medication Sig Start Date End Date Taking? Authorizing Provider  blood glucose meter kit and supplies Test blood sugar twice daily. Dx  code: R73.09 07/22/15   [name], MD  ergocalciferol (VITAMIN D2) 50000 UNITS capsule Take 1 capsule (50,000 Units total) by mouth once a week. 03/12/15   [name], MD  Ferrous Sulfate (IRON) 325 (65 FE) MG TABS Take 1 tablet by mouth daily.    Historical Provider, MD  fluticasone (FLONASE) 50 MCG/ACT nasal spray Place 1 spray into both nostrils daily as needed for allergies or rhinitis. 10/15/14   Chelle Jeffery, PA-C  glucose blood test strip Test blood sugar twice daily. Dx code: R73.09 07/22/15   [name], MD  ibuprofen (ADVIL,MOTRIN) 200 MG tablet Take 200 mg by mouth daily  as needed for moderate pain.    Historical Provider, MD  LORazepam (ATIVAN) 0.5 MG tablet Take 1 tablet (0.5 mg total) by mouth 2 (two) times daily as needed for anxiety. 12/15/14   [name], MD  Norethindrone-Ethinyl Estradiol-Fe Biphas (LO LOESTRIN FE) 1 MG-10 MCG / 10 MCG tablet Take 1 tablet by mouth daily.    Historical Provider, MD  [facility] DELICA LANCETS 75F MISC Test blood sugar twice daily. Dx code: R73.09 07/22/15   [name], MD  Propylene Glycol (SYSTANE BALANCE) 0.6 % SOLN Apply to eye. As needed for dry eyes    Historical Provider, MD  ranitidine (ZANTAC) 150 MG tablet TAKE ONE TABLET BY MOUTH TWICE DAILY 08/04/15   Gay Filler Copland, MD   BP 155/93 mmHg  Pulse 101  Temp(Src) 98.2 F (36.8 C) (Oral)  Resp 19  SpO2 100% Physical Exam  Constitutional: She is oriented to person, place, and time. She appears well-developed and well-nourished.  Anxious, HR slowed down as pt recounted and answered questions about her HPI  HENT:  Head: Normocephalic and atraumatic.  Mouth/Throat: Oropharynx is clear and moist.  Eyes: Conjunctivae are normal. Pupils are equal, round, and reactive to light. Right eye exhibits no discharge. Left eye exhibits no discharge.  Neck: Normal range of motion. Neck supple.  Cardiovascular: Regular rhythm and normal heart sounds.  Tachycardia present.  Exam reveals no gallop and no friction rub.   No murmur heard. tachycardic  Pulmonary/Chest: Effort normal and breath sounds normal. No respiratory distress. She has no wheezes. She has no rales.  Abdominal: Soft. Bowel sounds are normal. She exhibits no distension. There is no tenderness. There is no rebound and no guarding.  Musculoskeletal: She exhibits no edema or tenderness.  Neurological: She is alert and oriented to person, place, and time. She has normal reflexes. Coordination normal.  Skin: Skin is warm and dry. No rash noted. She is not diaphoretic. No erythema.  Psychiatric: Her mood appears anxious.  Her speech is rapid and/or pressured. She is not agitated.  Nursing note and vitals reviewed.   ED Course  Procedures  DIAGNOSTIC STUDIES: Oxygen Saturation is 100% on RA, normal by my interpretation.    COORDINATION OF CARE: 3:18 AM - Discussed probable sinus tachycardia and probable R paraspinal muscle spasm. Discussed plans to order diagnostic studies and imaging. Pt advised of plan for treatment and pt agrees.  Labs Review Labs Reviewed  CBC WITH DIFFERENTIAL/PLATELET  D-DIMER, QUANTITATIVE (NOT AT [facility])  URINE RAPID DRUG SCREEN, HOSP PERFORMED  CBG MONITORING, ED  I-STAT CHEM 8, ED  I-STAT TROPOININ, ED  I-STAT BETA HCG BLOOD, ED ([facility], [facility], [facility] ONLY)    Imaging Review No results found. I have personally reviewed and evaluated these images and lab results as part of my medical decision-making.   EKG  Interpretation None      MDM   Final diagnoses:  None    Results for orders placed or performed during the hospital encounter of 10/19/15  CBC with Differential/Platelet  Result Value Ref Range   WBC 16.8 (H) 4.0 - 10.5 K/uL   RBC 4.69 3.87 - 5.11 MIL/uL   Hemoglobin 14.4 12.0 - 15.0 g/dL   HCT 41.9 36.0 - 46.0 %   MCV 89.3 78.0 - 100.0 fL   MCH 30.7 26.0 - 34.0 pg   MCHC 34.4 30.0 - 36.0 g/dL   RDW 12.6 11.5 - 15.5 %   Platelets 311 150 - 400 K/uL   Neutrophils Relative % 42 %   Lymphocytes Relative 49 %   Monocytes Relative 8 %   Eosinophils Relative 1 %   Basophils Relative 0 %   Neutro Abs 7.1 1.7 - 7.7 K/uL   Lymphs Abs 8.2 (H) 0.7 - 4.0 K/uL   Monocytes Absolute 1.3 (H) 0.1 - 1.0 K/uL   Eosinophils Absolute 0.2 0.0 - 0.7 K/uL   Basophils Absolute 0.0 0.0 - 0.1 K/uL   WBC Morphology ABSOLUTE LYMPHOCYTOSIS   D-dimer, quantitative (not at [facility])  Result Value Ref Range   D-Dimer, Quant <0.27 0.00 - 0.50 ug/mL-FEU  Urine rapid drug screen (hosp performed)  Result Value Ref Range   Opiates NONE DETECTED NONE DETECTED   Cocaine NONE DETECTED NONE DETECTED    Benzodiazepines NONE DETECTED NONE DETECTED   Amphetamines NONE DETECTED NONE DETECTED   Tetrahydrocannabinol NONE DETECTED NONE DETECTED   Barbiturates NONE DETECTED NONE DETECTED  I-Stat Chem 8, ED  Result Value Ref Range   Sodium 141 135 - 145 mmol/L   Potassium 2.7 (LL) 3.5 - 5.1 mmol/L   Chloride 101 101 - 111 mmol/L   BUN 10 6 - 20 mg/dL   Creatinine, Ser 0.90 0.44 - 1.00 mg/dL   Glucose, Bld 158 (H) 65 - 99 mg/dL   Calcium, Ion 1.09 (L) 1.12 - 1.23 mmol/L   TCO2 24 0 - 100 mmol/L   Hemoglobin 15.6 (H) 12.0 - 15.0 g/dL   HCT 46.0 36.0 - 46.0 %  I-stat troponin, ED  Result Value Ref Range   Troponin i, poc 0.00 0.00 - 0.08 ng/mL   Comment 3          I-Stat Beta hCG blood, ED ([facility], [facility], [facility] only)  Result Value Ref Range   I-stat hCG, quantitative <5.0 <5 mIU/mL   Comment 3           Dg Chest 2 View  10/19/2015  CLINICAL DATA:  Tachycardia, mid to LEFT chest pain, shortness of breath, dizziness and shakiness today. History of diabetes and hypertension. EXAM: CHEST  2 VIEW COMPARISON:  Chest radiograph September 09, 2015 FINDINGS: Cardiomediastinal silhouette is normal. The lungs are clear without pleural effusions or focal consolidations. Trachea projects midline and there is no pneumothorax. Soft tissue planes and included osseous structures are non-suspicious. Very mild degenerative change of the thoracic spine. IMPRESSION: Normal chest. Electronically Signed   By: Elon Alas M.D.   On: 10/19/2015 04:11    Heart rate came down to 100 while talking to the patient without any intervention.  Negative ddimer and normal troponin in the setting of 4 days of ongoing symptoms excludes ACS.  I suspect this is mostly anxiety.  Patient is supposed to be taking Cardizem for BP and rate control and ativan for anxiety but is not taking either.  Have treated the  potassium and started supplementation.  Follow up with your PMD for recheck in 2 days  I personally performed the services  described in this documentation, which was scribed in my presence. The recorded information has been reviewed and is accurate.    Veatrice Kells, MD 10/19/15 [phone]

## 2015-10-19 NOTE — Telephone Encounter (Signed)
Left message to call.

## 2015-10-19 NOTE — ED Notes (Signed)
Pt reports feeling lightheaded and dizzy since seeing her Physical Therapist on Thursday. Reports that her heart started racing about an hour ago when she laid down. Pt is tachy in triage. Appears anxious. Pt sees a cardiologist. Beginning to complain of chest pain in triage.

## 2015-10-21 ENCOUNTER — Emergency Department (HOSPITAL_COMMUNITY)
Admission: EM | Admit: 2015-10-21 | Discharge: 2015-10-21 | Disposition: A | Discharge status: Home / Self Care | Payer: 59 | Attending: Emergency Medicine | Admitting: Emergency Medicine

## 2015-10-21 ENCOUNTER — Other Ambulatory Visit: Payer: Self-pay

## 2015-10-21 ENCOUNTER — Encounter (HOSPITAL_COMMUNITY): Payer: Self-pay | Admitting: Emergency Medicine

## 2015-10-21 DIAGNOSIS — Z7951 Long term (current) use of inhaled steroids: Secondary | ICD-10-CM | POA: Diagnosis not present

## 2015-10-21 DIAGNOSIS — Z79899 Other long term (current) drug therapy: Secondary | ICD-10-CM | POA: Insufficient documentation

## 2015-10-21 DIAGNOSIS — Z87891 Personal history of nicotine dependence: Secondary | ICD-10-CM | POA: Diagnosis not present

## 2015-10-21 DIAGNOSIS — Z880 Allergy status to penicillin: Secondary | ICD-10-CM | POA: Insufficient documentation

## 2015-10-21 DIAGNOSIS — Z86018 Personal history of other benign neoplasm: Secondary | ICD-10-CM | POA: Insufficient documentation

## 2015-10-21 DIAGNOSIS — R42 Dizziness and giddiness: Secondary | ICD-10-CM | POA: Diagnosis not present

## 2015-10-21 DIAGNOSIS — R110 Nausea: Secondary | ICD-10-CM | POA: Diagnosis not present

## 2015-10-21 DIAGNOSIS — K219 Gastro-esophageal reflux disease without esophagitis: Secondary | ICD-10-CM | POA: Diagnosis not present

## 2015-10-21 DIAGNOSIS — I10 Essential (primary) hypertension: Secondary | ICD-10-CM | POA: Diagnosis not present

## 2015-10-21 DIAGNOSIS — R000 Tachycardia, unspecified: Secondary | ICD-10-CM | POA: Diagnosis present

## 2015-10-21 DIAGNOSIS — E119 Type 2 diabetes mellitus without complications: Secondary | ICD-10-CM | POA: Diagnosis not present

## 2015-10-21 DIAGNOSIS — R002 Palpitations: Secondary | ICD-10-CM | POA: Diagnosis not present

## 2015-10-21 DIAGNOSIS — F419 Anxiety disorder, unspecified: Secondary | ICD-10-CM | POA: Diagnosis not present

## 2015-10-21 LAB — I-STAT TROPONIN, ED: Troponin i, poc: 0.03 ng/mL (ref 0.00–0.08)

## 2015-10-21 NOTE — ED Notes (Signed)
C/o fast heart rate with chest discomfort. Pt recently seen here Monday for the same. States she took her ativan w/o relief. HR 108 at triage. NAD

## 2015-10-21 NOTE — ED Provider Notes (Signed)
CSN: [id number]     Arrival date & time 10/21/15  0125 History   First MD Initiated Contact with Patient 10/21/15 0309     Chief Complaint  Patient presents with  . Tachycardia     (Consider location/radiation/quality/duration/timing/severity/associated sxs/prior Treatment) HPI Comments: Patient is a 39 year old female who presents to the emergency department for palpitations. She has been seen by cardiology for similar symptoms with an extensive workup. She has follow-up with her cardiologist tomorrow. She reports that her palpitations were characterized by a rapid heart rate similar to prior episodes. It began while resting this evening. She reports some mild nausea and dizziness with symptoms which have since resolved. She also states "it felt like somebody punched me on the right side of my chest". Patient tried bearing down as well as blowing through a straw for symptoms. She also applied ice to the back of her neck. When these provided no relief, patient took Ativan for symptoms. Her palpitations have since resolved. She denies any symptoms currently.  The history is provided by the patient. No language interpreter was used.    Past Medical History  Diagnosis Date  . Hypertension   . GERD (gastroesophageal reflux disease)   . Tachycardia   . Diabetes mellitus without complication ([place])   . Fibroid uterus   . Back pain   . Chest pain    Past Surgical History  Procedure Laterality Date  . Wisdom tooth extraction     Family History  Problem Relation Age of Onset  . Seizures Mother   . Cancer Father     Liver  . Diabetes Father 110  . Hypertension Father   . Heart disease Father 11    CEA, LE Stenting  . Alzheimer's disease Maternal Grandmother   . Heart disease Maternal Grandfather    Social History  Substance Use Topics  . Smoking status: Former Smoker -- 0.25 packs/day for 10 years    Types: Cigarettes    Quit date: 08/19/2014  . Smokeless tobacco: Never Used  .  Alcohol Use: No   OB History    Gravida Para Term Preterm AB TAB SAB Ectopic Multiple Living   _0      Review of Systems  Constitutional: Negative for fever.  Respiratory: Negative for shortness of breath.   Cardiovascular: Positive for palpitations. Negative for chest pain.  Gastrointestinal: Positive for nausea. Negative for vomiting.  Neurological: Positive for dizziness. Negative for syncope.  All other systems reviewed and are negative.   Allergies  Penicillins; Tamiflu; and Tinidazole  Home Medications   Prior to Admission medications   Medication Sig Start Date End Date Taking? Authorizing Provider  ergocalciferol (VITAMIN D2) 50000 UNITS capsule Take 1 capsule (50,000 Units total) by mouth once a week. 03/12/15  Yes [name], MD  Ferrous Sulfate (IRON) 325 (65 FE) MG TABS Take 1 tablet by mouth daily.   Yes Historical Provider, MD  fluticasone (FLONASE) 50 MCG/ACT nasal spray Place 1 spray into both nostrils daily as needed for allergies or rhinitis. 10/15/14  Yes Chelle Jeffery, PA-C  ibuprofen (ADVIL,MOTRIN) 200 MG tablet Take 200 mg by mouth daily as needed for moderate pain.   Yes Historical Provider, MD  LORazepam (ATIVAN) 0.5 MG tablet Take 1 tablet (0.5 mg total) by mouth 2 (two) times daily as needed for anxiety. 12/15/14  Yes [name], MD  Norethindrone-Ethinyl Estradiol-Fe Biphas (LO LOESTRIN FE) 1 MG-10 MCG / 10  MCG tablet Take 1 tablet by mouth daily.   Yes Historical Provider, MD  potassium chloride SA (K-DUR,KLOR-CON) 20 MEQ tablet Take 1 tablet (20 mEq total) by mouth 2 (two) times daily. 10/19/15  Yes April Palumbo, MD  Propylene Glycol (SYSTANE BALANCE) 0.6 % SOLN Apply 1 drop to eye 3 (three) times daily as needed (for dry eyes).    Yes Historical Provider, MD  ranitidine (ZANTAC) 150 MG tablet TAKE ONE TABLET BY MOUTH TWICE DAILY 08/04/15  Yes Jessica C Copland, MD  blood glucose meter kit and supplies Test blood sugar twice daily. Dx code:  R73.09 07/22/15   [name], MD  glucose blood test strip Test blood sugar twice daily. Dx code: R73.09 07/22/15   [name], MD  [facility] DELICA LANCETS 62I MISC Test blood sugar twice daily. Dx code: R73.09 07/22/15   [name], MD   BP 151/92 mmHg  Pulse 73  Temp(Src) 98.3 F (36.8 C) (Oral)  Resp 14  Ht _0  (1.651 m)  Wt 80.74 kg  BMI 29.62 kg/m2  SpO2 100%  LMP 10/11/2015   Physical Exam  Constitutional: She is oriented to person, place, and time. She appears well-developed and well-nourished. No distress.  Nontoxic/nonseptic appearing  HENT:  Head: Normocephalic and atraumatic.  Eyes: Conjunctivae and EOM are normal. No scleral icterus.  Neck: Normal range of motion.  Cardiovascular: Normal rate, regular rhythm and intact distal pulses.   HR 68-83bpm  Pulmonary/Chest: Effort normal and breath sounds normal. No respiratory distress. She has no wheezes. She has no rales.  Respirations even and unlabored. Lungs clear.  Musculoskeletal: Normal range of motion.  Neurological: She is alert and oriented to person, place, and time. She exhibits normal muscle tone. Coordination normal.  Skin: Skin is warm and dry. No rash noted. She is not diaphoretic. No erythema. No pallor.  Psychiatric: Her behavior is normal. Her mood appears anxious (mild).  Nursing note and vitals reviewed.   ED Course  Procedures (including critical care time) [place], ED    Imaging Review No results found. I have personally reviewed and evaluated these images and lab results as part of my medical decision-making.   EKG Interpretation   Date/Time:  Wednesday October 21 2015 01:34:05 EST Ventricular Rate:  110 PR Interval:  186 QRS Duration: 98 QT Interval:  330 QTC Calculation: 446 R Axis:   45 Text Interpretation:  Sinus tachycardia Confirmed by [facility]  MD,  Emmaline Kluver ([id number]) on 10/21/2015 3:08:41 AM      MDM   Final diagnoses:  Palpitations     39 year old female presents to the emergency department for palpitations. She has been seen in the emergency department for similar symptoms, most recently 2 days ago. She has follow-up with her cardiologist tomorrow. EKG shows sinus tachycardia. Since this was completed, heart rate has improved to normal sinus rhythm and maintained throughout ED course as well as my presentation with the patient.   Patient has had extensive workups in the past for her symptoms including an echocardiogram. She has been instructed to take Cardizem, but has never started this. Given resolution of symptoms after taking Ativan, I do believe there is an anxiety component. This may, however, only be perpetuating symptoms rather than causing onset of her palpitations. I do not believe there to be an emergent cause of patient's palpitations today; therefore, I believe she is stable for discharge and outpatient follow-up with her cardiologist tomorrow. Return precautions  given at discharge. Patient discharged in good condition with no unaddressed concerns.   Filed Vitals:   10/21/15 0252 10/21/15 0254 10/21/15 0407  BP: 171/90 134/73 151/92  Pulse: 108  73  Temp: 98.3 F (36.8 C)    TempSrc: Oral    Resp: _0 Height: _1  (1.651 m)    Weight: 80.74 kg    SpO2: 100% 100% 100%     Antonietta Breach, PA-C 10/21/15 0518  April Palumbo, MD 10/21/15 [phone]

## 2015-10-21 NOTE — Discharge Instructions (Signed)

## 2015-10-23 ENCOUNTER — Encounter: Payer: Self-pay | Admitting: Physician Assistant

## 2015-10-23 ENCOUNTER — Ambulatory Visit (INDEPENDENT_AMBULATORY_CARE_PROVIDER_SITE_OTHER): Payer: 59 | Admitting: Physician Assistant

## 2015-10-23 VITALS — BP 170/90 | HR 96 | Ht 64.0 in | Wt 177.1 lb

## 2015-10-23 DIAGNOSIS — F418 Other specified anxiety disorders: Secondary | ICD-10-CM | POA: Diagnosis not present

## 2015-10-23 DIAGNOSIS — R000 Tachycardia, unspecified: Secondary | ICD-10-CM

## 2015-10-23 DIAGNOSIS — R4589 Other symptoms and signs involving emotional state: Secondary | ICD-10-CM

## 2015-10-23 DIAGNOSIS — I10 Essential (primary) hypertension: Secondary | ICD-10-CM | POA: Diagnosis not present

## 2015-10-23 NOTE — Patient Instructions (Signed)
Your physician wants you to follow-up in: 6 months or sooner if needed with Dr Percival Spanish. You will receive a reminder letter in the mail two months in advance. If you don't receive a letter, please call our office to schedule the follow-up appointment.  If you need a refill on your cardiac medications before your next appointment, please call your pharmacy.

## 2015-10-23 NOTE — Progress Notes (Signed)
Patient ID: Brooke Turner, female   DOB: [birth year], 39 y.o.   MRN: [id number]    Date:  10/23/2015   ID:  Brooke Turner, DOB [birth year], MRN [id number]  PCP:  [name], MD  Primary Cardiologist:  Hochrein  Chief Complaint  Patient presents with  . Hospitalization Follow-up    occassional chest pain, occassional shortness of breath, no edema, has pain and cramping in legs,had badly- lightheaded and dizziness     History of Present Illness: Brooke Turner is a 39 y.o. female  with a history of obesity with a BMI over 40 (BMI is now 30), hypertension, GERD, tachycardia, diabetes mellitus. Patient was seen twice in the ER in the last week due to elevated heart rate. First time it was 159 and EKG was sinus tachycardia. While Dr. Randal Buba talk to her heart rate came down to 100. Given negative d-dimer and normal troponin.  Patient tried taking Xanax and that helped as well.  Primary care provider recently started her on Zoloft but she has yet to take it. She is from [place] and at some point I think in the last year cardiologist up in [place] and put her on 25 mg of carvedilol which she said wiped her out. This was later weaned off.  She has yet to try the Cardizem that was prescribed when necessary by Dr.Hochrein.  She does report that she gets a little short of breath when her heart rate is elevated.  When it does get elevated and she did makes her even more anxious.  The patient currently denies nausea, vomiting, fever, chest pain, orthopnea, dizziness, PND, cough, congestion, abdominal pain, hematochezia, melena, lower extremity edema, claudication.  Wt Readings from Last 3 Encounters:  10/23/15 177 lb 2 oz (80.343 kg)  10/21/15 178 lb (80.74 kg)  09/22/15 178 lb 9 oz (80.995 kg)     Past Medical History  Diagnosis Date  . Hypertension   . GERD (gastroesophageal reflux disease)   . Tachycardia   . Diabetes mellitus without complication ([place])   . Fibroid  uterus   . Back pain   . Chest pain     Current Outpatient Prescriptions  Medication Sig Dispense Refill  . blood glucose meter kit and supplies Test blood sugar twice daily. Dx code: R73.09 1 each 0  . diltiazem (CARDIZEM) 30 MG tablet Take 30 mg by mouth as needed.    . ergocalciferol (VITAMIN D2) 50000 UNITS capsule Take 1 capsule (50,000 Units total) by mouth once a week. 12 capsule 1  . fluticasone (FLONASE) 50 MCG/ACT nasal spray Place 1 spray into both nostrils daily as needed for allergies or rhinitis. 16 g 12  . glucose blood test strip Test blood sugar twice daily. Dx code: R73.09 200 each 3  . ibuprofen (ADVIL,MOTRIN) 200 MG tablet Take 200 mg by mouth daily as needed for moderate pain.    Marland Kitchen LORazepam (ATIVAN) 0.5 MG tablet Take 1 tablet (0.5 mg total) by mouth 2 (two) times daily as needed for anxiety. 30 tablet 1  . Norethindrone-Ethinyl Estradiol-Fe Biphas (LO LOESTRIN FE) 1 MG-10 MCG / 10 MCG tablet Take 1 tablet by mouth daily.    [name] DELICA LANCETS 63O MISC Test blood sugar twice daily. Dx code: R73.09 200 each 3  . potassium chloride SA (K-DUR,KLOR-CON) 20 MEQ tablet Take 1 tablet (20 mEq total) by mouth 2 (two) times daily. 10 tablet 0  . Propylene Glycol (SYSTANE BALANCE) 0.6 % SOLN Apply 1  drop to eye 3 (three) times daily as needed (for dry eyes).     . ranitidine (ZANTAC) 150 MG tablet TAKE ONE TABLET BY MOUTH TWICE DAILY 60 tablet 3   No current facility-administered medications for this visit.    Allergies:    Allergies  Allergen Reactions  . Penicillins Hives    Has patient had a PCN reaction causing immediate rash, facial/tongue/throat swelling, SOB or lightheadedness with hypotension:Yes Has patient had a PCN reaction causing severe rash involving mucus membranes or skin necrosis:unsure Has patient had a PCN reaction that required hospitalization:No Has patient had a PCN reaction occurring within the last 10 years:No If all of the above answers are  "NO", then may proceed with Cephalosporin use.     . Tamiflu [Oseltamivir Phosphate] Nausea And Vomiting  . Tinidazole     Caused severe abd pain/type of colitis     Social History:  The patient  reports that she quit smoking about 14 months ago. Her smoking use included Cigarettes. She has a 2.5 pack-year smoking history. She has never used smokeless tobacco. She reports that she does not drink alcohol or use illicit drugs.   Family history:   Family History  Problem Relation Age of Onset  . Seizures Mother   . Cancer Father     Liver  . Diabetes Father 76  . Hypertension Father   . Heart disease Father 43    CEA, LE Stenting  . Alzheimer's disease Maternal Grandmother   . Heart disease Maternal Grandfather     ROS:  Please see the history of present illness.  All other systems reviewed and negative.   PHYSICAL EXAM: VS:  BP 170/90 mmHg  Pulse 96  Ht 5' 4"  (1.626 m)  Wt 177 lb 2 oz (80.343 kg)  BMI 30.39 kg/m2  LMP 10/11/2015 Obese, well developed, in no acute distress HEENT: Pupils are equal round react to light accommodation extraocular movements are intact.  Neck: no JVDNo cervical lymphadenopathy. Cardiac: Regular rate and rhythm without murmurs rubs or gallops. Lungs:  clear to auscultation bilaterally, no wheezing, rhonchi or rales Abd: soft, nontender, positive bowel sounds all quadrants, no hepatosplenomegaly Ext: no lower extremity edema.  2+ radial and dorsalis pedis pulses. Skin: warm and dry Neuro:  Grossly normal   ASSESSMENT AND PLAN:  Problem List Items Addressed This Visit    Tachycardia   Essential hypertension - Primary   Relevant Medications   diltiazem (CARDIZEM) 30 MG tablet   Anxiety about health      We talked a long time about various medications in particular the Cardizem that she has not tried but was previously prescribed.  Her symptoms abated when she was in the ER just by talking to Dr. Randal Buba.  No definite seems to be an anxiety  component. I recommended she try the Cardizem as opposed to Valium.  She'll follow-up in 6 months.

## 2015-11-12 ENCOUNTER — Encounter (HOSPITAL_COMMUNITY): Payer: Self-pay | Admitting: Emergency Medicine

## 2015-11-12 DIAGNOSIS — Z86018 Personal history of other benign neoplasm: Secondary | ICD-10-CM | POA: Insufficient documentation

## 2015-11-12 DIAGNOSIS — Z87891 Personal history of nicotine dependence: Secondary | ICD-10-CM | POA: Diagnosis not present

## 2015-11-12 DIAGNOSIS — R0602 Shortness of breath: Secondary | ICD-10-CM | POA: Insufficient documentation

## 2015-11-12 DIAGNOSIS — R42 Dizziness and giddiness: Secondary | ICD-10-CM | POA: Diagnosis not present

## 2015-11-12 DIAGNOSIS — H538 Other visual disturbances: Secondary | ICD-10-CM | POA: Insufficient documentation

## 2015-11-12 DIAGNOSIS — K219 Gastro-esophageal reflux disease without esophagitis: Secondary | ICD-10-CM | POA: Insufficient documentation

## 2015-11-12 DIAGNOSIS — I10 Essential (primary) hypertension: Secondary | ICD-10-CM | POA: Diagnosis not present

## 2015-11-12 DIAGNOSIS — Z79899 Other long term (current) drug therapy: Secondary | ICD-10-CM | POA: Diagnosis not present

## 2015-11-12 DIAGNOSIS — R55 Syncope and collapse: Secondary | ICD-10-CM | POA: Insufficient documentation

## 2015-11-12 DIAGNOSIS — R200 Anesthesia of skin: Secondary | ICD-10-CM | POA: Diagnosis not present

## 2015-11-12 DIAGNOSIS — Z880 Allergy status to penicillin: Secondary | ICD-10-CM | POA: Insufficient documentation

## 2015-11-12 DIAGNOSIS — E119 Type 2 diabetes mellitus without complications: Secondary | ICD-10-CM | POA: Insufficient documentation

## 2015-11-12 DIAGNOSIS — R531 Weakness: Secondary | ICD-10-CM | POA: Diagnosis present

## 2015-11-12 LAB — CBC WITH DIFFERENTIAL/PLATELET
Basophils Absolute: 0 10*3/uL (ref 0.0–0.1)
Basophils Relative: 0 %
Eosinophils Absolute: 0.1 10*3/uL (ref 0.0–0.7)
Eosinophils Relative: 1 %
HCT: 41.6 % (ref 36.0–46.0)
Hemoglobin: 14.4 g/dL (ref 12.0–15.0)
Lymphocytes Relative: 46 %
Lymphs Abs: 5.4 10*3/uL — ABNORMAL HIGH (ref 0.7–4.0)
MCH: 30.4 pg (ref 26.0–34.0)
MCHC: 34.6 g/dL (ref 30.0–36.0)
MCV: 87.9 fL (ref 78.0–100.0)
Monocytes Absolute: 0.7 10*3/uL (ref 0.1–1.0)
Monocytes Relative: 6 %
Neutro Abs: 5.4 10*3/uL (ref 1.7–7.7)
Neutrophils Relative %: 47 %
Platelets: 274 10*3/uL (ref 150–400)
RBC: 4.73 MIL/uL (ref 3.87–5.11)
RDW: 12.5 % (ref 11.5–15.5)
WBC: 11.6 10*3/uL — ABNORMAL HIGH (ref 4.0–10.5)

## 2015-11-12 NOTE — ED Notes (Signed)
Pt screened at [facility]- presents c/o dizziness since this am, left arm weakness and intermittent numbness since "this afternoon"-unable to give exact symptom onset for arm, states it has gotten progressively worse throughout evening, dropping things at times, also reports muscles spasms to left facial area. Triage RN notified, pt being taken back to room for EKG and screened for stroke symptoms.

## 2015-11-12 NOTE — ED Notes (Signed)
Pt. reports dizziness , left arm weakness /tingling/" sore" onset yesterday , alert and oriented , no facial asymmetry , speech clear , equal grips and no arm drift. Evaluated by EDP at triage .

## 2015-11-13 ENCOUNTER — Emergency Department (HOSPITAL_COMMUNITY): Payer: 59

## 2015-11-13 ENCOUNTER — Emergency Department (HOSPITAL_COMMUNITY)
Admission: EM | Admit: 2015-11-13 | Discharge: 2015-11-13 | Disposition: A | Discharge status: Home / Self Care | Payer: 59 | Attending: Emergency Medicine | Admitting: Emergency Medicine

## 2015-11-13 DIAGNOSIS — R200 Anesthesia of skin: Secondary | ICD-10-CM

## 2015-11-13 DIAGNOSIS — R42 Dizziness and giddiness: Secondary | ICD-10-CM

## 2015-11-13 LAB — COMPREHENSIVE METABOLIC PANEL
ALT: 29 U/L (ref 14–54)
AST: 31 U/L (ref 15–41)
Albumin: 4.3 g/dL (ref 3.5–5.0)
Alkaline Phosphatase: 53 U/L (ref 38–126)
Anion gap: 12 (ref 5–15)
BUN: 9 mg/dL (ref 6–20)
CO2: 23 mmol/L (ref 22–32)
Calcium: 9 mg/dL (ref 8.9–10.3)
Chloride: 105 mmol/L (ref 101–111)
Creatinine, Ser: 1.06 mg/dL — ABNORMAL HIGH (ref 0.44–1.00)
GFR calc Af Amer: >60 mL/min (ref >60)
GFR calc non Af Amer: >60 mL/min (ref >60)
Glucose, Bld: 105 mg/dL — ABNORMAL HIGH (ref 65–99)
Potassium: 3.3 mmol/L — ABNORMAL LOW (ref 3.5–5.1)
Sodium: 140 mmol/L (ref 135–145)
Total Bilirubin: 0.4 mg/dL (ref 0.3–1.2)
Total Protein: 7.5 g/dL (ref 6.5–8.1)

## 2015-11-13 LAB — URINE MICROSCOPIC-ADD ON

## 2015-11-13 LAB — URINALYSIS, ROUTINE W REFLEX MICROSCOPIC
Bilirubin Urine: NEGATIVE
Glucose, UA: NEGATIVE mg/dL
Ketones, ur: 15 mg/dL — AB
Nitrite: NEGATIVE
Protein, ur: NEGATIVE mg/dL
Specific Gravity, Urine: 1.01 (ref 1.005–1.030)
pH: 6 (ref 5.0–8.0)

## 2015-11-13 LAB — TROPONIN I: Troponin I: <0.03 ng/mL (ref <0.031)

## 2015-11-13 LAB — TSH: TSH: 2.136 u[IU]/mL (ref 0.350–4.500)

## 2015-11-13 LAB — D-DIMER, QUANTITATIVE: D-Dimer, Quant: 0.27 ug/mL-FEU (ref 0.00–0.50)

## 2015-11-13 NOTE — ED Notes (Signed)
Signature pad broken, pt verbalized discharge instructions and follow up instructions.

## 2015-11-13 NOTE — Discharge Instructions (Signed)
All the results in the ER are normal, labs and imaging. We are not sure what is causing your symptoms. The workup in the ER is not complete, and is limited to screening for life threatening and emergent conditions only, so please see a primary care doctor for further evaluation.  Please see your doctor in 1 week. Hydrate well.  Please return to the ER if your numbness, tingling, weakness is getting worse or you have new confusion, seizures, headaches. Please return to the ER if you have worsening chest pain, shortness of breath, pain radiating to your jaw, shoulder, or back, sweats or fainting.    Dizziness Dizziness is a common problem. It is a feeling of unsteadiness or light-headedness. You may feel like you are about to faint. Dizziness can lead to injury if you stumble or fall. Anyone can become dizzy, but dizziness is more common in older adults. This condition can be caused by a number of things, including medicines, dehydration, or illness. HOME CARE INSTRUCTIONS Taking these steps may help with your condition: Eating and Drinking  Drink enough fluid to keep your urine clear or pale yellow. This helps to keep you from becoming dehydrated. Try to drink more clear fluids, such as water.  Do not drink alcohol.  Limit your caffeine intake if directed by your health care provider.  Limit your salt intake if directed by your health care provider. Activity  Avoid making quick movements.  Rise slowly from chairs and steady yourself until you feel okay.  In the morning, first sit up on the side of the bed. When you feel okay, stand slowly while you hold onto something until you know that your balance is fine.  Move your legs often if you need to stand in one place for a long time. Tighten and relax your muscles in your legs while you are standing.  Do not drive or operate heavy machinery if you feel dizzy.  Avoid bending down if you feel dizzy. Place items in your home so that  they are easy for you to reach without leaning over. Lifestyle  Do not use any tobacco products, including cigarettes, chewing tobacco, or electronic cigarettes. If you need help quitting, ask your health care provider.  Try to reduce your stress level, such as with yoga or meditation. Talk with your health care provider if you need help. General Instructions  Watch your dizziness for any changes.  Take medicines only as directed by your health care provider. Talk with your health care provider if you think that your dizziness is caused by a medicine that you are taking.  Tell a friend or a family member that you are feeling dizzy. If he or she notices any changes in your behavior, have this person call your health care provider.  Keep all follow-up visits as directed by your health care provider. This is important. SEEK MEDICAL CARE IF:  Your dizziness does not go away.  Your dizziness or light-headedness gets worse.  You feel nauseous.  You have reduced hearing.  You have new symptoms.  You are unsteady on your feet or you feel like the room is spinning. SEEK IMMEDIATE MEDICAL CARE IF:  You vomit or have diarrhea and are unable to eat or drink anything.  You have problems talking, walking, swallowing, or using your arms, hands, or legs.  You feel generally weak.  You are not thinking clearly or you have trouble forming sentences. It may take a friend or family member to notice  this.  You have chest pain, abdominal pain, shortness of breath, or sweating.  Your vision changes.  You notice any bleeding.  You have a headache.  You have neck pain or a stiff neck.  You have a fever.   This information is not intended to replace advice given to you by your health care provider. Make sure you discuss any questions you have with your health care provider.   Document Released: 04/19/2001 Document Revised: 03/10/2015 Document Reviewed: 10/20/2014 Elsevier Interactive  Patient Education 2016 [employer] Paresthesia Paresthesia is an abnormal burning or prickling sensation. This sensation is generally felt in the hands, arms, legs, or feet. However, it may occur in any part of the body. Usually, it is not painful. The feeling may be described as:  Tingling or numbness.  Pins and needles.  Skin crawling.  Buzzing.  Limbs falling asleep.  Itching. Most people experience temporary (transient) paresthesia at some time in their lives. Paresthesia may occur when you breathe too quickly (hyperventilation). It can also occur without any apparent cause. Commonly, paresthesia occurs when pressure is placed on a nerve. The sensation quickly goes away after the pressure is removed. For some people, however, paresthesia is a long-lasting (chronic) condition that is caused by an underlying disorder. If you continue to have paresthesia, you may need further medical evaluation. HOME CARE INSTRUCTIONS Watch your condition for any changes. Taking the following actions may help to lessen any discomfort that you are feeling:  Avoid drinking alcohol.  Try acupuncture or massage to help relieve your symptoms.  Keep all follow-up visits as directed by your health care provider. This is important. SEEK MEDICAL CARE IF:  You continue to have episodes of paresthesia.  Your burning or prickling feeling gets worse when you walk.  You have pain, cramps, or dizziness.  You develop a rash. SEEK IMMEDIATE MEDICAL CARE IF:  You feel weak.  You have trouble walking or moving.  You have problems with speech, understanding, or vision.  You feel confused.  You cannot control your bladder or bowel movements.  You have numbness after an injury.  You faint.   This information is not intended to replace advice given to you by your health care provider. Make sure you discuss any questions you have with your health care provider.   Document Released: 10/14/2002 Document  Revised: 03/10/2015 Document Reviewed: 10/20/2014 Elsevier Interactive Patient Education [employer].

## 2015-11-13 NOTE — ED Provider Notes (Addendum)
CSN: [id number]     Arrival date & time 11/12/15  2310 History  By signing my name below, I, Irene Pap, attest that this documentation has been prepared under the direction and in the presence of Varney Biles, MD. Electronically Signed: Irene Pap, ED Scribe. 11/13/2015. 2:33 AM.  Chief Complaint  Patient presents with  . Dizziness  . Extremity Weakness   The history is provided by the patient. No language interpreter was used.  HPI Comments: Brooke Turner is a 40 y.o. Female with a hx of palpitations, HTN, tachycardia, DM, chest pain and back pain who presents to the Emergency Department complaining of constant, waxing and waning dizziness and generalized "head pressure" onset one day ago. She states that the dizziness worsens with rapid movements of the head and when she is very tired. She reports associated gradually worsening, intermittent left arm weakness/ "tingling and heaviness"/soreness and constant, gradually worsening lightheadedness. She states that the lightheadedness would make her feel like she was off balance and would give her blurred vision. She notes many episodes of near syncope lasting a few seconds, ~1-2x an hour. Pt reports that her face feels "slow" after smiling and has had SOB at rest today and yesterday. Pt is no longer on medication for HTN, as directed by her PCP. She reports that she had just driven back to the area from [place] last night. She denies syncope, leg pain or numbness. Pt is on BC pills. Pt is a former smoker and reports family hx of heart disease in their 71s. Mother has epilepsy. Pt denies drug use or hx of blood clots.    Past Medical History  Diagnosis Date  . Hypertension   . GERD (gastroesophageal reflux disease)   . Tachycardia   . Diabetes mellitus without complication ([place])   . Fibroid uterus   . Back pain   . Chest pain    Past Surgical History  Procedure Laterality Date  . Wisdom tooth extraction     Family History   Problem Relation Age of Onset  . Seizures Mother   . Cancer Father     Liver  . Diabetes Father 73  . Hypertension Father   . Heart disease Father 63    CEA, LE [place]  . Alzheimer's disease Maternal Grandmother   . Heart disease Maternal Grandfather    Social History  Substance Use Topics  . Smoking status: Former Smoker -- 0.25 packs/day for 10 years    Types: Cigarettes    Quit date: 08/19/2014  . Smokeless tobacco: Never Used  . Alcohol Use: No   OB History    Gravida Para Term Preterm AB TAB SAB Ectopic Multiple Living   _0     Review of Systems 10 Systems reviewed and all are negative for acute change except as noted in the HPI.  Allergies  Penicillins; Tamiflu; and Tinidazole  Home Medications   Prior to Admission medications   Medication Sig Start Date End Date Taking? Authorizing Provider  blood glucose meter kit and supplies Test blood sugar twice daily. Dx code: R73.09 07/22/15  Yes [name], MD  diltiazem (CARDIZEM) 30 MG tablet Take 30 mg by mouth daily as needed (blood pressure).    Yes Historical Provider, MD  ergocalciferol (VITAMIN D2) 50000 UNITS capsule Take 1 capsule (50,000 Units total) by mouth once a week. 03/12/15  Yes [name], MD  fluticasone (FLONASE) 50 MCG/ACT nasal spray  Place 1 spray into both nostrils daily as needed for allergies or rhinitis. 10/15/14  Yes Chelle Jeffery, PA-C  glucose blood test strip Test blood sugar twice daily. Dx code: R73.09 07/22/15  Yes [name], MD  ibuprofen (ADVIL,MOTRIN) 200 MG tablet Take 200 mg by mouth daily as needed for moderate pain.   Yes Historical Provider, MD  LORazepam (ATIVAN) 0.5 MG tablet Take 1 tablet (0.5 mg total) by mouth 2 (two) times daily as needed for anxiety. 12/15/14  Yes [name], MD  Norethindrone-Ethinyl Estradiol-Fe Biphas (LO LOESTRIN FE) 1 MG-10 MCG / 10 MCG tablet Take 1 tablet by mouth daily.   Yes Historical Provider, MD  [facility] DELICA LANCETS 69G MISC Test  blood sugar twice daily. Dx code: R73.09 07/22/15  Yes [name], MD  Propylene Glycol (SYSTANE BALANCE) 0.6 % SOLN Apply 1 drop to eye 3 (three) times daily as needed (for dry eyes).    Yes Historical Provider, MD  ranitidine (ZANTAC) 150 MG tablet TAKE ONE TABLET BY MOUTH TWICE DAILY 08/04/15  Yes Gay Filler Copland, MD   BP 194/91 mmHg  Pulse 112  Temp(Src) 98.9 F (37.2 C) (Oral)  Resp 16  SpO2 100%  LMP 10/11/2015 Physical Exam  Constitutional: She appears well-developed and well-nourished. No distress.  HENT:  Head: Normocephalic and atraumatic.  Mouth/Throat: Oropharynx is clear and moist. No oropharyngeal exudate.  Eyes: Conjunctivae and EOM are normal. Pupils are equal, round, and reactive to light. Right eye exhibits no discharge. Left eye exhibits no discharge. No scleral icterus.  Neck: Normal range of motion. Neck supple. No JVD present. Carotid bruit is not present. No thyromegaly present.  Cardiovascular: Normal rate, regular rhythm, normal heart sounds and intact distal pulses.  Exam reveals no gallop and no friction rub.   No murmur heard. Pulses:      Radial pulses are 2+ on the right side, and 2+ on the left side.  Pulmonary/Chest: Effort normal and breath sounds normal. No respiratory distress. She has no wheezes. She has no rales.  Lungs clear to auscultation  Abdominal: Soft. Bowel sounds are normal. She exhibits no distension and no mass. There is no tenderness.  Musculoskeletal: Normal range of motion. She exhibits no edema or tenderness.  Lymphadenopathy:    She has no cervical adenopathy.  Neurological: She is alert. No cranial nerve deficit. Coordination normal.  Slight numbness to the left side of the face; able to discriminate between sharp and dull to bilateral upper extremities with subjective numbness to left upper extremity; No sensory deficits in the lower extremities bilaterally; Upper and lower extremity strength 4/5 bilaterally; cerebellar exam shows no  dysmetria; cranial nerves 2-12 intact  Skin: Skin is warm and dry. No rash noted. No erythema.  Psychiatric: She has a normal mood and affect. Her behavior is normal.  Nursing note and vitals reviewed.   ED Course  Procedures (including critical care time) DIAGNOSTIC STUDIES: Oxygen Saturation is 100% on RA, normal by my interpretation.    COORDINATION OF CARE: 1:19 AM-Discussed treatment plan which includes labs and CT scan with pt at bedside and pt agreed to plan.    Labs Review Labs Reviewed  CBC WITH DIFFERENTIAL/PLATELET - Abnormal; Notable for the following:    WBC 11.6 (*)    Lymphs Abs 5.4 (*)    All other components within normal limits  COMPREHENSIVE METABOLIC PANEL - Abnormal; Notable for the following:    Potassium 3.3 (*)    Glucose, Bld  105 (*)    Creatinine, Ser 1.06 (*)    All other components within normal limits  TSH  TROPONIN I  D-DIMER, QUANTITATIVE (NOT AT [facility])  URINALYSIS, ROUTINE W REFLEX MICROSCOPIC (NOT AT [facility])    Imaging Review Ct Head Wo Contrast  11/13/2015  CLINICAL DATA:  Dizziness since this morning. Left arm weakness. Progressive symptoms. EXAM: CT HEAD WITHOUT CONTRAST TECHNIQUE: Contiguous axial images were obtained from the base of the skull through the vertex without intravenous contrast. COMPARISON:  05/20/2015 FINDINGS: No intracranial hemorrhage, mass effect, or midline shift. No hydrocephalus. The basilar cisterns are patent. No evidence of territorial infarct. No intracranial fluid collection. Calvarium is intact. Included paranasal sinuses and mastoid air cells are well aerated. IMPRESSION: No intracranial abnormality on noncontrast head CT. Electronically Signed   By: Jeb Levering M.D.   On: 11/13/2015 01:26   Mr Brain Wo Contrast  11/13/2015  CLINICAL DATA:  Initial evaluation for left-sided numbness. EXAM: MRI HEAD WITHOUT CONTRAST TECHNIQUE: Multiplanar, multiecho pulse sequences of the brain and surrounding structures were  obtained without intravenous contrast. COMPARISON:  Prior CT from earlier the same day. FINDINGS: The CSF containing spaces are within normal limits for patient age. Minimal subcentimeter T2/FLAIR hyperintensity present within the periventricular white matter, felt to be within normal limits for patient age. No mass lesion, midline shift, or extra-axial fluid collection. Ventricles are normal in size without evidence of hydrocephalus. No diffusion-weighted signal abnormality is identified to suggest acute intracranial infarct. Gray-white matter differentiation is maintained. Normal flow voids are seen within the intracranial vasculature. No intracranial hemorrhage identified. The cervicomedullary junction is normal. Pituitary gland is within normal limits. Pituitary stalk is midline. The globes and optic nerves demonstrate a normal appearance with normal signal intensity. The The bone marrow signal intensity is normal. Calvarium is intact. Visualized upper cervical spine is within normal limits. Scalp soft tissues are unremarkable. Paranasal sinuses are clear.  No mastoid effusion. IMPRESSION: Normal MRI of the brain with no acute intracranial process identified. Electronically Signed   By: Jeannine Boga M.D.   On: 11/13/2015 04:23   I have personally reviewed and evaluated these images and lab results as part of my medical decision-making.   EKG Interpretation   Date/Time:  Thursday November 12 2015 23:16:35 EST Ventricular Rate:  112 PR Interval:  142 QRS Duration: 80 QT Interval:  320 QTC Calculation: 436 R Axis:   60 Text Interpretation:  Sinus tachycardia Otherwise normal ECG No  significant change since last tracing normal axis, normal intervals  Confirmed by Kathrynn Humble, MD, [facility] [phone]) on 11/13/2015 12:22:59 AM      MDM   Final diagnoses:  Left sided numbness  Dizziness    I personally performed the services described in this documentation, which was scribed in my presence. The  recorded information has been reviewed and is accurate.  PT comes in with L sided numbness, dizziness. She is healthy female. She does have PE risk factors - estrogen and recent travels (but did stop every 4-5 hours) and she was tachycardic.  Ddx: PE, Stroke, Aneurysm.  WELLS score is 1.5  - dimer resulted.  If the Dimer is neg - we will not pursue PE, and MRI brain will be ordered to r/o stroke. If MRi is neg - pt will be discharged.  If dimer is +, we will get MRI brain first, and consider adding CT angio head and neck to ensure there is no AN. NO clinical concerns for [facility] at this  time.  Varney Biles, MD 11/13/15 0254  4:49 AM Will d.c. MRI is neg. Unsure etiology. If her symptoms get worse, she has been advised to come back to the ER.  PCP f/u advised.  Varney Biles, MD 11/13/15 [phone]

## 2015-11-13 NOTE — ED Notes (Signed)
Pt transported to radiology.

## 2015-11-22 ENCOUNTER — Ambulatory Visit (INDEPENDENT_AMBULATORY_CARE_PROVIDER_SITE_OTHER): Payer: 59 | Admitting: Family Medicine

## 2015-11-22 VITALS — BP 138/80 | HR 97 | Temp 98.5°F | Resp 20 | Ht 62.6 in | Wt 167.8 lb

## 2015-11-22 DIAGNOSIS — J0100 Acute maxillary sinusitis, unspecified: Secondary | ICD-10-CM

## 2015-11-22 DIAGNOSIS — J069 Acute upper respiratory infection, unspecified: Secondary | ICD-10-CM

## 2015-11-22 MED ORDER — AZITHROMYCIN 250 MG PO TABS: ORAL_TABLET | ORAL | Status: DC

## 2015-11-22 MED ORDER — IPRATROPIUM BROMIDE 0.03 % NA SOLN: 2.0000 | Freq: Four times a day (QID) | NASAL | Status: DC

## 2015-11-22 MED ORDER — BENZONATATE 100 MG PO CAPS: 100.0000 mg | ORAL_CAPSULE | Freq: Three times a day (TID) | ORAL | Status: DC | PRN

## 2015-11-22 NOTE — Patient Instructions (Signed)
I recommend frequent warm salt water gargles, hot tea with honey and lemon, rest, and handwashing.  Hot showers or breathing in steam may help loosen the congestion.  Try a netti pot or sinus rinse is also likely to help you feel better and keep this from progressing. Sinusitis, Adult Sinusitis is redness, soreness, and inflammation of the paranasal sinuses. Paranasal sinuses are air pockets within the bones of your face. They are located beneath your eyes, in the middle of your forehead, and above your eyes. In healthy paranasal sinuses, mucus is able to drain out, and air is able to circulate through them by way of your nose. However, when your paranasal sinuses are inflamed, mucus and air can become trapped. This can allow bacteria and other germs to grow and cause infection. Sinusitis can develop quickly and last only a short time (acute) or continue over a long period (chronic). Sinusitis that lasts for more than 12 weeks is considered chronic. CAUSES Causes of sinusitis include:  Allergies.  Structural abnormalities, such as displacement of the cartilage that separates your nostrils (deviated septum), which can decrease the air flow through your nose and sinuses and affect sinus drainage.  Functional abnormalities, such as when the small hairs (cilia) that line your sinuses and help remove mucus do not work properly or are not present. SIGNS AND SYMPTOMS Symptoms of acute and chronic sinusitis are the same. The primary symptoms are pain and pressure around the affected sinuses. Other symptoms include:  Upper toothache.  Earache.  Headache.  Bad breath.  Decreased sense of smell and taste.  A cough, which worsens when you are lying flat.  Fatigue.  Fever.  Thick drainage from your nose, which often is green and may contain pus (purulent).  Swelling and warmth over the affected sinuses. DIAGNOSIS Your health care provider will perform a physical exam. During your exam, your  health care provider may perform any of the following to help determine if you have acute sinusitis or chronic sinusitis:  Look in your nose for signs of abnormal growths in your nostrils (nasal polyps).  Tap over the affected sinus to check for signs of infection.  View the inside of your sinuses using an imaging device that has a light attached (endoscope). If your health care provider suspects that you have chronic sinusitis, one or more of the following tests may be recommended:  Allergy tests.  Nasal culture. A sample of mucus is taken from your nose, sent to a lab, and screened for bacteria.  Nasal cytology. A sample of mucus is taken from your nose and examined by your health care provider to determine if your sinusitis is related to an allergy. TREATMENT Most cases of acute sinusitis are related to a viral infection and will resolve on their own within 10 days. Sometimes, medicines are prescribed to help relieve symptoms of both acute and chronic sinusitis. These may include pain medicines, decongestants, nasal steroid sprays, or saline sprays. However, for sinusitis related to a bacterial infection, your health care provider will prescribe antibiotic medicines. These are medicines that will help kill the bacteria causing the infection. Rarely, sinusitis is caused by a fungal infection. In these cases, your health care provider will prescribe antifungal medicine. For some cases of chronic sinusitis, surgery is needed. Generally, these are cases in which sinusitis recurs more than 3 times per year, despite other treatments. HOME CARE INSTRUCTIONS  Drink plenty of water. Water helps thin the mucus so your sinuses can drain more easily.  Use a humidifier.  Inhale steam 3-4 times a day (for example, sit in the bathroom with the shower running).  Apply a warm, moist washcloth to your face 3-4 times a day, or as directed by your health care provider.  Use saline nasal sprays to help  moisten and clean your sinuses.  Take medicines only as directed by your health care provider.  If you were prescribed either an antibiotic or antifungal medicine, finish it all even if you start to feel better. SEEK IMMEDIATE MEDICAL CARE IF:  You have increasing pain or severe headaches.  You have nausea, vomiting, or drowsiness.  You have swelling around your face.  You have vision problems.  You have a stiff neck.  You have difficulty breathing.   This information is not intended to replace advice given to you by your health care provider. Make sure you discuss any questions you have with your health care provider.   Document Released: 10/24/2005 Document Revised: 11/14/2014 Document Reviewed: 11/08/2011 Elsevier Interactive Patient Education [employer].

## 2015-11-22 NOTE — Progress Notes (Signed)
Subjective:  By signing my name below, I, Raven Small, attest that this documentation has been prepared under the direction and in the presence of [name], MD.  Electronically Signed: Thea Alken, ED Scribe. 11/22/2015. 4:14 PM.   Patient ID: Brooke Turner, female    DOB: 02-11-1976, 40 y.o.   MRN: [id number]  HPI Chief Complaint  Patient presents with  . Cough    pt. needs referral to orthopedic specialist.  . Nasal Congestion    dark/ yellow phlegm,  . Sinus Problem  . Shortness of Breath   HPI Comments: Brooke Turner is a 40 y.o. female who presents to the [facility] complaining of productive cough that began 1 weeks ago. She reports associated SOB, chest congestion, nasal congestion with green drainage, sore throat, improved sinus pressure and chills. She's also had trouble sleeping due cough. She has been using severe sinus medication once and generic robitussin, which made her dizzy, without relief to symptoms. She also tried flonase initially for symptoms.   Pt has been checking BP at home which she states has been fluctuating. She was seen by cardiologist 12/16 and was prescribed diltiazem. She has not taken this medication yet. She was seen in the ED 10 days ago for left arm numbness and weakness along with palpitation and jaw tightness. She had a MRI of brain which was normal and Ddimer.    Pt has lost 10lbs in the past month   Wt Readings from Last 3 Encounters:  11/22/15 167 lb 12.8 oz (76.114 kg)  10/23/15 177 lb 2 oz (80.343 kg)  10/21/15 178 lb (80.74 kg)    Past Medical History  Diagnosis Date  . Hypertension   . GERD (gastroesophageal reflux disease)   . Tachycardia   . Diabetes mellitus without complication ([place])   . Fibroid uterus   . Back pain   . Chest pain    Past Surgical History  Procedure Laterality Date  . Wisdom tooth extraction     Prior to Admission medications   Medication Sig Start Date End Date  Taking? Authorizing Provider  blood glucose meter kit and supplies Test blood sugar twice daily. Dx code: R73.09 07/22/15  Yes [name], MD  diltiazem (CARDIZEM) 30 MG tablet Take 30 mg by mouth daily as needed (blood pressure).    Yes Historical Provider, MD  ergocalciferol (VITAMIN D2) 50000 UNITS capsule Take 1 capsule (50,000 Units total) by mouth once a week. 03/12/15  Yes [name], MD  fluticasone (FLONASE) 50 MCG/ACT nasal spray Place 1 spray into both nostrils daily as needed for allergies or rhinitis. 10/15/14  Yes Chelle Jeffery, PA-C  glucose blood test strip Test blood sugar twice daily. Dx code: R73.09 07/22/15  Yes [name], MD  ibuprofen (ADVIL,MOTRIN) 200 MG tablet Take 200 mg by mouth daily as needed for moderate pain.   Yes Historical Provider, MD  LORazepam (ATIVAN) 0.5 MG tablet Take 1 tablet (0.5 mg total) by mouth 2 (two) times daily as needed for anxiety. 12/15/14  Yes [name], MD  Norethindrone-Ethinyl Estradiol-Fe Biphas (LO LOESTRIN FE) 1 MG-10 MCG / 10 MCG tablet Take 1 tablet by mouth daily.   Yes Historical Provider, MD  [facility] DELICA LANCETS 78G MISC Test blood sugar twice daily. Dx code: R73.09 07/22/15  Yes [name], MD  Propylene Glycol (SYSTANE BALANCE) 0.6 % SOLN Apply 1 drop to eye 3 (three) times daily as needed (for dry  eyes).    Yes Historical Provider, MD  ranitidine (ZANTAC) 150 MG tablet TAKE ONE TABLET BY MOUTH TWICE DAILY 08/04/15  Yes Darreld Mclean, MD   Review of Systems  Constitutional: Positive for chills. Negative for fever.  HENT: Positive for congestion, sinus pressure and sore throat.   Respiratory: Positive for cough and shortness of breath.   Gastrointestinal: Negative for nausea and vomiting.       Objective:   Physical Exam  HENT:  Mouth/Throat: Posterior oropharyngeal erythema ( right worse than left) present.  Neck: No thyromegaly present.  Cardiovascular: Normal rate, regular rhythm and normal heart sounds.   No murmur  heard. Pulmonary/Chest: Effort normal and breath sounds normal. She has no wheezes. She has no rales.  Lymphadenopathy:    She has no cervical adenopathy.   Filed Vitals:   11/22/15 1602 11/22/15 1606  BP: 158/89 138/80  Pulse: 97   Temp: 98.5 F (36.9 C)   TempSrc: Oral   Resp: 20   Height: 5' 2.6" (1.59 m)   Weight: 167 lb 12.8 oz (76.114 kg)   SpO2: 98%      Assessment & Plan:   1. Acute upper respiratory infection   2. Acute maxillary sinusitis, recurrence not specified    Take Mucinex and Delsym for cough and atrovent nasal spray. Also advised to use Neti pot to flush sinuses.   Gave snap rx for zpack in case sxs cont to worsen as had been ill x 1 wk but encouraged pt to try these alternative methods to relieve congestion first for sxs improvement which pt agrees to do.  VERY cautious about otc cough/cold meds - no decongestants and pt because VERY anxious and will go to the ER if anything raises her HR.  Encouraged pt to try prn diltiazem as recommended by the cardiology PA but she is still considering- wanting to talk about it with her cardiologist first.  Meds ordered this encounter  Medications  . ipratropium (ATROVENT) 0.03 % nasal spray    Sig: Place 2 sprays into the nose 4 (four) times daily.    Dispense:  30 mL    Refill:  1  . benzonatate (TESSALON) 100 MG capsule    Sig: Take 1-2 capsules (100-200 mg total) by mouth 3 (three) times daily as needed for cough.    Dispense:  40 capsule    Refill:  0  . azithromycin (ZITHROMAX) 250 MG tablet    Sig: Take 2 tabs PO x 1 dose, then 1 tab PO QD x 4 days    Dispense:  6 tablet    Refill:  0    I personally performed the services described in this documentation, which was scribed in my presence. The recorded information has been reviewed and considered, and addended by me as needed.  [name], MD MPH

## 2015-12-18 ENCOUNTER — Other Ambulatory Visit: Payer: Self-pay | Admitting: Family Medicine

## 2015-12-24 ENCOUNTER — Encounter: Payer: Self-pay | Admitting: Internal Medicine

## 2015-12-24 ENCOUNTER — Institutional Professional Consult (permissible substitution): Payer: 59 | Admitting: Internal Medicine

## 2015-12-24 ENCOUNTER — Ambulatory Visit (INDEPENDENT_AMBULATORY_CARE_PROVIDER_SITE_OTHER): Payer: 59 | Admitting: Internal Medicine

## 2015-12-24 VITALS — BP 130/82 | HR 68 | Ht 64.0 in | Wt 169.0 lb

## 2015-12-24 DIAGNOSIS — G4733 Obstructive sleep apnea (adult) (pediatric): Secondary | ICD-10-CM | POA: Diagnosis not present

## 2015-12-24 DIAGNOSIS — R5383 Other fatigue: Secondary | ICD-10-CM

## 2015-12-24 NOTE — Patient Instructions (Signed)
Order- schedule split protocol NPSG    Dx OSA 

## 2015-12-24 NOTE — Progress Notes (Signed)
12/24/2015-40 year old female former smoker-Self referral; had sleep study at [facility]. Pt states she did not understand the final results of sleep study so it was recommended to come here and discuss as second opinon per Dr Rexene Alberts.  She has been followed at this office by Dr. Melvyn Novas for pulmonary evaluation of dyspnea. NPSG 11/18/14 [facility]- AHI 0.2 per hour with some snoring, mild periodic limb movement. Sr. has obstructive sleep apnea. Patient sleeps alone without reporter. Aware she snores loudly. She describes waking at night feeling short of breath with confusion and heart racing. Sometimes wakes up choking. Bedtime varies from 11 PM to 2 AM, up several times during the night before finally up between 7 and 8 AM. Reports over 70 pound weight loss in the past 2 years. No ENT surgery. Denies pregnancy. "Afraid to go to sleep" the cause of these events. Lorazepam 0.5 mg, one half tablet, helps some. Previous cardiology evaluation for palpitation-less frequent now. Medical problems include high blood pressure, DM 2  Prior to Admission medications   Medication Sig Start Date End Date Taking? Authorizing Provider  benzonatate (TESSALON) 100 MG capsule Take 1-2 capsules (100-200 mg total) by mouth 3 (three) times daily as needed for cough. 11/22/15  Yes [name], MD  blood glucose meter kit and supplies Test blood sugar twice daily. Dx code: R73.09 07/22/15  Yes [name], MD  ergocalciferol (VITAMIN D2) 50000 UNITS capsule Take 1 capsule (50,000 Units total) by mouth once a week. 03/12/15  Yes [name], MD  fluticasone (FLONASE) 50 MCG/ACT nasal spray Place 1 spray into both nostrils daily as needed for allergies or rhinitis. 10/15/14  Yes Chelle Jeffery, PA-C  glucose blood test strip Test blood sugar twice daily. Dx code: R73.09 07/22/15  Yes [name], MD  ibuprofen (ADVIL,MOTRIN) 200 MG tablet Take 200 mg by mouth daily as needed for moderate pain.   Yes Historical Provider, MD   LORazepam (ATIVAN) 0.5 MG tablet Take 1 tablet (0.5 mg total) by mouth 2 (two) times daily as needed for anxiety. 12/15/14  Yes [name], MD  Norethindrone-Ethinyl Estradiol-Fe Biphas (LO LOESTRIN FE) 1 MG-10 MCG / 10 MCG tablet Take 1 tablet by mouth daily.   Yes Historical Provider, MD  [facility] DELICA LANCETS 40J MISC Test blood sugar twice daily. Dx code: R73.09 07/22/15  Yes [name], MD  Propylene Glycol (SYSTANE BALANCE) 0.6 % SOLN Apply 1 drop to eye 3 (three) times daily as needed (for dry eyes).    Yes Historical Provider, MD  ranitidine (ZANTAC) 150 MG tablet TAKE ONE TABLET BY MOUTH TWICE DAILY 12/21/15  Yes Harrison Mons, PA-C   Past Medical History  Diagnosis Date  . Hypertension   . GERD (gastroesophageal reflux disease)   . Tachycardia   . Diabetes mellitus without complication ([place])   . Fibroid uterus   . Back pain   . Chest pain    Past Surgical History  Procedure Laterality Date  . Wisdom tooth extraction     Family History  Problem Relation Age of Onset  . Seizures Mother   . Cancer Father     Liver  . Diabetes Father 72  . Hypertension Father   . Heart disease Father 56    CEA, LE Stenting  . Alzheimer's disease Maternal Grandmother   . Heart disease Maternal Grandfather    Social History   Social History  . Marital Status: Single    Spouse Name: N/A  .  Number of Children: 0  . Years of Education: 16   Occupational History  . Customer Service [employer]   Social History Main Topics  . Smoking status: Former Smoker -- 0.25 packs/day for 10 years    Types: Cigarettes    Quit date: 08/19/2014  . Smokeless tobacco: Never Used  . Alcohol Use: No  . Drug Use: No  . Sexual Activity:    Partners: Male    Birth Control/ Protection: Other-see comments     Comment: NuvaRing   Other Topics Concern  . Not on file   Social History Narrative   Patient lives at home alone .   Patient works full time at [employer]   Education college.    Right handed.   Caffeine none   ROS-see HPI   Negative unless "+" Constitutional:    + weight loss, night sweats, fevers, chills, fatigue, lassitude. HEENT:    headaches, difficulty swallowing, tooth/dental problems, sore throat,       sneezing, itching, ear ache, nasal congestion, post nasal drip, snoring CV:    chest pain, orthopnea, PND, swelling in lower extremities, anasarca,                                                          dizziness, palpitations Resp:   shortness of breath with exertion or at rest.                productive cough,   non-productive cough, coughing up of blood.              change in color of mucus.  wheezing.   Skin:    rash or lesions. GI:  No-   heartburn, indigestion, abdominal pain, nausea, vomiting, diarrhea,                 change in bowel habits, loss of appetite GU: dysuria, change in color of urine, no urgency or frequency.   flank pain. MS:   joint pain, stiffness, decreased range of motion, back pain. Neuro-     nothing unusual Psych:  change in mood or affect.  depression or anxiety.   memory loss.  OBJ- Physical Exam General- Alert, Oriented, Affect-appropriate, Distress- none acute, + talkative Skin- rash-none, lesions- none, excoriation- none Lymphadenopathy- none Head- atraumatic            Eyes- Gross vision intact, PERRLA, conjunctivae and secretions clear            Ears- Hearing, canals-normal            Nose- Clear, no-Septal dev, mucus, polyps, erosion, perforation             Throat- Mallampati II-III , mucosa clear , drainage- none, tonsils- atrophic Neck- flexible , trachea midline, no stridor , thyroid nl, carotid no bruit Chest - symmetrical excursion , unlabored           Heart/CV- RRR , no murmur , no gallop  , no rub, nl s1 s2                           - JVD- none , edema- none, stasis changes- none, varices- none           Lung- clear to P&A, wheeze- none, cough- none ,  dullness-none, rub- none           Chest wall-   Abd-  Br/ Gen/ Rectal- Not done, not indicated Extrem- cyanosis- none, clubbing, none, atrophy- none, strength- nl Neuro- grossly intact to observation

## 2015-12-25 ENCOUNTER — Institutional Professional Consult (permissible substitution): Payer: 59 | Admitting: Internal Medicine

## 2015-12-27 NOTE — Assessment & Plan Note (Signed)
She describes episodes of waking from sleep feeling short of breath with heart racing. Some of this may be anxiety or even reflux events. Despite weight loss, treatment decisions would be heavily affected by diagnosis of OSA. We need to double check that. Plan-schedule sleep study

## 2016-01-07 ENCOUNTER — Telehealth: Payer: Self-pay

## 2016-01-07 NOTE — Telephone Encounter (Signed)
Called the [facility] on file, they do not have the lorazepam RX on file at all.

## 2016-01-07 NOTE — Telephone Encounter (Signed)
Left message for pt to call back  °

## 2016-01-07 NOTE — Telephone Encounter (Signed)
Patent is calling because her Lorazepam prescription has expired. She states that this is urgent and needs it refilled today because she's going out of town tomorrow. Patient states that she's aware that it takes atleast 24 hours but needs it today. I informed patient that the wait time is the same for all medication request.  [phone]

## 2016-01-07 NOTE — Telephone Encounter (Signed)
It looks like a rx for lorazepam was written on 2/8 for #30 tabs with 1 refill. On the medication tab it notes that the med was printed out.  Did pt not pick this up? Is it still on file here? If not, can check drug database to see if was ever filled. Or perhaps she may have a new rx on file at [facility] from 2/8 that she is unaware of?  Pt does not use lorazepam that often so I['m sure she hasn't use 60 tab in 2 week. . .   If it cannot be found away, please call in same 2/8 rx for pt.

## 2016-01-08 NOTE — Telephone Encounter (Signed)
Called in Rx to [facility]. Pt advised.

## 2016-01-19 ENCOUNTER — Ambulatory Visit (HOSPITAL_BASED_OUTPATIENT_CLINIC_OR_DEPARTMENT_OTHER): Payer: 59 | Attending: Internal Medicine

## 2016-01-19 DIAGNOSIS — R0683 Snoring: Secondary | ICD-10-CM | POA: Diagnosis not present

## 2016-01-19 DIAGNOSIS — Z79899 Other long term (current) drug therapy: Secondary | ICD-10-CM | POA: Diagnosis not present

## 2016-01-19 DIAGNOSIS — G4733 Obstructive sleep apnea (adult) (pediatric): Secondary | ICD-10-CM | POA: Diagnosis present

## 2016-01-21 ENCOUNTER — Telehealth: Payer: Self-pay

## 2016-01-21 NOTE — Telephone Encounter (Signed)
Patient has elevated blood pressure and is looking for advising. She wants to know that the top and bottom and numbers should be. The top ranges from 140-150 and the bottom between 100 and 109. She has been taking lorazepam.  [phone] Please call!

## 2016-01-22 NOTE — Telephone Encounter (Signed)
Normal bp should be 120/80.  If bp is running higher, first thing she might want to do is bring her cuff in and make sure it is accurate.  She has been put on a large variety of bp meds - many of which she likely still has at home so she could restart the carvedilol or try the prn diltiazem (both from cards)

## 2016-01-22 NOTE — Telephone Encounter (Signed)
Dr Shaw please review.  

## 2016-01-23 ENCOUNTER — Other Ambulatory Visit: Payer: Self-pay | Admitting: Physician Assistant

## 2016-01-23 NOTE — Telephone Encounter (Signed)
Spoke with patient she states she would like to try diltiazem but does not have prescription can you call in?  She does not want to restart carvedilol.  She is going to keep track of her BP and bring in record.  She states she has check her BP machine it is accurate.

## 2016-01-24 ENCOUNTER — Ambulatory Visit (INDEPENDENT_AMBULATORY_CARE_PROVIDER_SITE_OTHER): Payer: 59 | Admitting: Physician Assistant

## 2016-01-24 VITALS — BP 140/82 | HR 85 | Temp 97.5°F | Resp 12 | Ht 66.0 in | Wt 166.0 lb

## 2016-01-24 DIAGNOSIS — F418 Other specified anxiety disorders: Secondary | ICD-10-CM | POA: Diagnosis not present

## 2016-01-24 DIAGNOSIS — H8111 Benign paroxysmal vertigo, right ear: Secondary | ICD-10-CM | POA: Diagnosis not present

## 2016-01-24 DIAGNOSIS — J3489 Other specified disorders of nose and nasal sinuses: Secondary | ICD-10-CM

## 2016-01-24 DIAGNOSIS — R4589 Other symptoms and signs involving emotional state: Secondary | ICD-10-CM

## 2016-01-24 DIAGNOSIS — G4733 Obstructive sleep apnea (adult) (pediatric): Secondary | ICD-10-CM | POA: Diagnosis not present

## 2016-01-24 MED ORDER — AZITHROMYCIN 250 MG PO TABS: ORAL_TABLET | ORAL | Status: DC

## 2016-01-24 NOTE — Progress Notes (Signed)
[facility] [address], [place] [address] [phone]  Date:  01/24/2016   Name:  Brooke Turner   DOB:  21-Nov-1975   MRN:  [id number]  PCP:  [name], MD    Chief Complaint: Dizziness and Chest Pain   History of Present Illness:  This is a 40 y.o. female with PMH anxiety, chronic rhinitis and maxillary sinusitis, prediabetes, HTN paroxysmal tachycardia who is presenting with dizziness x 2 days.  States room is moving whenever she moves head or bends forward. States yesterday this got a little better. States this morning she took a shower and that made much worse. This triggered a panic attack and she took an ativan. Bought meclizine bc pharmacist suggested this but hasn't taken yet. States she is having pressure in her face and head. Not having nasal congestion but is noticing some postnasal drip. With her dizziness she will experience about 3-4 minutes of left chest pain described as throbbing. States afterwards will have a few minutes of burning in her left chest. Does get sob and with these episodes, feels like can't catch breath. Has a hard time focusing with these episodes, a little blurred vision. This morning started getting a frontal headache. Never had anything like this before. This has never happened with sinus infections before. Was treated for sinusitis in 11/2015 with zpak and symptoms improved. Takes flonase for allergies. Takes prn.. Not taking currently. TSH 2 months ago normal. Had thyroid u/s 10 months ago and normal  Weight 223 08/2014. 166 lb now. States this has been semi-intentional. Did make changes in exercise and diet but feels she has lost more weight than she means to. Had brain MRI 1 month ago and normal. Had CT abdomen pelvis 10 months ago and normal. Has had normal chest xrays since started loosing weight. Managed by cardiology for tachycardia and neurology for sleep issues. Has had several EKGs. Last 11/12/15 with sinus tach,  o/w normal.  Review of Systems:  Review of Systems See HPI  Patient Active Problem List   Diagnosis Date Noted  . Left sided numbness 06/11/2015  . Chronic tension-type headache, not intractable 06/10/2015  . Dyspnea 03/18/2015  . Post-traumatic stress reaction 02/25/2015  . Anxiety disorder due to general medical condition with panic attack 02/25/2015  . Subserous leiomyoma of uterus 02/25/2015  . Anxiety about health 02/25/2015  . Deviated nasal septum 12/30/2014  . Chronic rhinitis 12/30/2014  . Chronic maxillary sinusitis 12/30/2014  . Snorings 09/30/2014  . Fatigue 09/26/2014  . Dizziness and giddiness 09/11/2014  . Tachycardia 09/11/2014  . Precordial chest pain 09/11/2014  . Prediabetes 08/26/2014  . Acanthosis nigricans 03/19/2014  . Vitamin D deficiency 07/15/2013  . Large breasts 09/12/2012  . Essential hypertension 02/17/2012  . Contraception management 02/17/2012  . Obesity (BMI 30-39.9) previous BMI was over 40. Patient has lost 70 pounds in the last year 02/17/2012  . Anxiety disorder 02/17/2012  . Chronic back pain 02/17/2012    Prior to Admission medications   Medication Sig Start Date End Date Taking? Authorizing Provider  blood glucose meter kit and supplies Test blood sugar twice daily. Dx code: R73.09 07/22/15  Yes [name], MD  ergocalciferol (VITAMIN D2) 50000 UNITS capsule Take 1 capsule (50,000 Units total) by mouth once a week. 03/12/15  Yes [name], MD  fluticasone (FLONASE) 50 MCG/ACT nasal spray Place 1 spray into both nostrils daily as needed for allergies or rhinitis. 10/15/14  Yes Harrison Mons,  PA-C  glucose blood test strip Test blood sugar twice daily. Dx code: R73.09 07/22/15  Yes [name], MD  LORazepam (ATIVAN) 0.5 MG tablet Take 1 tablet (0.5 mg total) by mouth 2 (two) times daily as needed for anxiety. 12/15/14  Yes [name], MD  Norethindrone-Ethinyl Estradiol-Fe Biphas (LO LOESTRIN FE) 1 MG-10 MCG / 10 MCG tablet Take 1 tablet by  mouth daily.   Yes Historical Provider, MD  [facility] DELICA LANCETS 78H MISC Test blood sugar twice daily. Dx code: R73.09 07/22/15  Yes [name], MD  Propylene Glycol (SYSTANE BALANCE) 0.6 % SOLN Apply 1 drop to eye 3 (three) times daily as needed (for dry eyes).    Yes Historical Provider, MD  ranitidine (ZANTAC) 150 MG tablet TAKE ONE TABLET BY MOUTH TWICE DAILY 01/24/16  Yes [name], MD    Allergies  Allergen Reactions  . Penicillins Hives    Has patient had a PCN reaction causing immediate rash, facial/tongue/throat swelling, SOB or lightheadedness with hypotension:Yes Has patient had a PCN reaction causing severe rash involving mucus membranes or skin necrosis:unsure Has patient had a PCN reaction that required hospitalization:No Has patient had a PCN reaction occurring within the last 10 years:No If all of the above answers are "NO", then may proceed with Cephalosporin use.     . Tamiflu [Oseltamivir Phosphate] Nausea And Vomiting  . Tinidazole     Caused severe abd pain/type of colitis     Past Surgical History  Procedure Laterality Date  . Wisdom tooth extraction      Social History  Substance Use Topics  . Smoking status: Former Smoker -- 0.25 packs/day for 10 years    Types: Cigarettes    Quit date: 08/19/2014  . Smokeless tobacco: Never Used  . Alcohol Use: No    Family History  Problem Relation Age of Onset  . Seizures Mother   . Cancer Father     Liver  . Diabetes Father 38  . Hypertension Father   . Heart disease Father 91    CEA, LE Stenting  . Alzheimer's disease Maternal Grandmother   . Heart disease Maternal Grandfather     Medication list has been reviewed and updated.  Physical Examination:  Physical Exam  Constitutional: She is oriented to person, place, and time. She appears well-developed and well-nourished. No distress.  HENT:  Head: Normocephalic and atraumatic.  Right Ear: Hearing, tympanic membrane, external ear and ear canal  normal.  Left Ear: Hearing, tympanic membrane, external ear and ear canal normal.  Nose: Nose normal. Right sinus exhibits no frontal sinus tenderness. Left sinus exhibits no frontal sinus tenderness.  Mouth/Throat: Uvula is midline, oropharynx is clear and moist and mucous membranes are normal.  Mild bilateral maxillary tenderness  Eyes: Conjunctivae, EOM and lids are normal. Pupils are equal, round, and reactive to light. Right eye exhibits no discharge. Left eye exhibits no discharge. No scleral icterus. Left eye exhibits no nystagmus.  Neck: Carotid bruit is not present. No thyromegaly present.  Cardiovascular: Normal rate, regular rhythm, normal heart sounds and normal pulses.   No murmur heard. Pulmonary/Chest: Effort normal and breath sounds normal. No respiratory distress. She has no wheezes. She has no rhonchi. She has no rales.  Musculoskeletal: Normal range of motion.  Neurological: She is alert and oriented to person, place, and time. She has normal strength and normal reflexes. No cranial nerve deficit or sensory deficit. Gait normal.  Reproduced vertigo with dix-hallpike to the  right. Not reproduced with dix-hallpike to the left. Performed epley manuevers twice with marked improvement in symptoms.  Skin: Skin is warm, dry and intact. No lesion and no rash noted.  Psychiatric: She has a normal mood and affect. Her speech is normal and behavior is normal. Thought content normal.   BP 140/82 mmHg  Pulse 85  Temp(Src) 97.5 F (36.4 C) (Oral)  Resp 12  Ht 5' 6"  (1.676 m)  Wt 166 lb (75.297 kg)  BMI 26.81 kg/m2  SpO2 98%  LMP 01/04/2016  Assessment and Plan:  1. BPPV (benign paroxysmal positional vertigo), right 2. Anxiety about health 3. Sinus pressure Vertigo consistent with BPPV. Marked improvement with 2 rounds of epley maneuvers. She will start back on flonase and take meclizine prn dizziness. If in 2 days still with head pressure and dizziness, start zpak for possible  sinusitis. Return if symptoms worsen. F/u with Dr. Brigitte Pulse in 1 week. Pt does have a hx of anxiety and tachycardia... Possible that these are contributing to current symptoms. No lab work or EKG today as labs in recent past, several advanced imaging studies and several EKGs have all been normal. If not getting better, will do further work up at that time. - azithromycin (ZITHROMAX) 250 MG tablet; Take 2 tabs PO x 1 dose, then 1 tab PO QD x 4 days  Dispense: 6 tablet; Refill: 0   Benjaman Pott. Drenda Freeze, MHS [facility]  01/24/2016

## 2016-01-24 NOTE — Patient Instructions (Addendum)
Start flonase. Take meclizine as needed for dizziness. You can start zpak in 2 days if symptoms are not improving. Return in 1 week to follow up with Dr. Brigitte Pulse or sooner if needed.    IF you received an x-ray today, you will receive an invoice from [facility]. Please contact [facility] at [phone] with questions or concerns regarding your invoice.   IF you received labwork today, you will receive an invoice from [employer]. Please contact [facility] at [phone] with questions or concerns regarding your invoice.   Our billing staff will not be able to assist you with questions regarding bills from these companies.  You will be contacted with the lab results as soon as they are available. The fastest way to get your results is to activate your My Chart account. Instructions are located on the last page of this paperwork. If you have not heard from [place] regarding the results in 2 weeks, please contact this office.

## 2016-01-24 NOTE — Progress Notes (Signed)
   Patient Name: Brooke Turner, Brooke Turner Date: 01/19/2016 Gender: Female D.O.B: [birth year] Age (years): 66 Referring Provider: Baird Lyons MD, ABSM Height (inches): 64 Interpreting Physician: Baird Lyons MD, ABSM Weight (lbs): 166 RPSGT: Madelon Lips BMI: 28 MRN: [id number] Neck Size: 14.50 CLINICAL INFORMATION Sleep Study Type: NPSG Indication for sleep study: OSA Epworth Sleepiness Score: 5  SLEEP STUDY TECHNIQUE As per the AASM Manual for the Scoring of Sleep and Associated Events v2.3 (April 2016) with a hypopnea requiring 4% desaturations. The channels recorded and monitored were frontal, central and occipital EEG, electrooculogram (EOG), submentalis EMG (chin), nasal and oral airflow, thoracic and abdominal wall motion, anterior tibialis EMG, snore microphone, electrocardiogram, and pulse oximetry.  MEDICATIONS Patient's medications include: charted for review Medications self-administered by patient during sleep study : lorazepam  SLEEP ARCHITECTURE The study was initiated at 11:18:40 PM and ended at 5:36:53 AM. Sleep onset time was 13.2 minutes and the sleep efficiency was 80.8%. The total sleep time was 305.5 minutes. Stage REM latency was 101.0 minutes. The patient spent 6.38% of the night in stage N1 sleep, 68.91% in stage N2 sleep, 0.00% in stage N3 and 24.71% in REM. Alpha intrusion was absent. Supine sleep was 0.65%. Wake after sleep onset 59 minutes  RESPIRATORY PARAMETERS The overall apnea/hypopnea index (AHI) was 0.2 per hour. There were 0 total apneas, including 0 obstructive, 0 central and 0 mixed apneas. There were 1 hypopneas and 9 RERAs. The AHI during Stage REM sleep was 0.0 per hour. AHI while supine was 30.0 per hour. The mean oxygen saturation was 98.01%. The minimum SpO2 during sleep was 93.00%. Moderate snoring was noted during this study.  CARDIAC DATA The 2 lead EKG demonstrated sinus rhythm. The mean heart rate was 68.34 beats per  minute. Other EKG findings include: None.  LEG MOVEMENT DATA The total PLMS were 0 with a resulting PLMS index of 0.00. Associated arousal with leg movement index was 0.0 .  IMPRESSIONS - No significant obstructive sleep apnea occurred during this study (AHI = 0.2/h). - No significant central sleep apnea occurred during this study (CAI = 0.0/h). - The patient had minimal or no oxygen desaturation during the study (Min O2 = 93.00%) - The patient snored with Moderate snoring volume. - No cardiac abnormalities were noted during this study. - Clinically significant periodic limb movements did not occur during sleep. No significant associated arousals.  DIAGNOSIS - Primary Snoring (786.09 [R06.83 ICD-10]) - Normal study  RECOMMENDATIONS - Positional therapy avoiding supine position during sleep. - Avoid alcohol, sedatives and other CNS depressants that may worsen sleep apnea and disrupt normal sleep architecture. - Sleep hygiene should be reviewed to assess factors that may improve sleep quality. - Weight management and regular exercise should be initiated or continued if appropriate.  Deneise Lever Diplomate, American Board of Sleep Medicine  ELECTRONICALLY SIGNED ON:  01/24/2016, 2:03 PM [place] PH: [phone]   FX: [phone] [place]

## 2016-01-25 ENCOUNTER — Telehealth: Payer: Self-pay | Admitting: Cardiology

## 2016-01-25 ENCOUNTER — Emergency Department (HOSPITAL_COMMUNITY): Payer: 59

## 2016-01-25 ENCOUNTER — Emergency Department (HOSPITAL_COMMUNITY)
Admission: EM | Admit: 2016-01-25 | Discharge: 2016-01-25 | Disposition: A | Discharge status: Home / Self Care | Payer: 59 | Attending: Emergency Medicine | Admitting: Emergency Medicine

## 2016-01-25 ENCOUNTER — Encounter (HOSPITAL_COMMUNITY): Payer: Self-pay | Admitting: Emergency Medicine

## 2016-01-25 DIAGNOSIS — I10 Essential (primary) hypertension: Secondary | ICD-10-CM | POA: Insufficient documentation

## 2016-01-25 DIAGNOSIS — R0789 Other chest pain: Secondary | ICD-10-CM | POA: Diagnosis not present

## 2016-01-25 DIAGNOSIS — K219 Gastro-esophageal reflux disease without esophagitis: Secondary | ICD-10-CM | POA: Diagnosis not present

## 2016-01-25 DIAGNOSIS — Z86018 Personal history of other benign neoplasm: Secondary | ICD-10-CM | POA: Diagnosis not present

## 2016-01-25 DIAGNOSIS — Z880 Allergy status to penicillin: Secondary | ICD-10-CM | POA: Diagnosis not present

## 2016-01-25 DIAGNOSIS — Z3202 Encounter for pregnancy test, result negative: Secondary | ICD-10-CM | POA: Insufficient documentation

## 2016-01-25 DIAGNOSIS — Z793 Long term (current) use of hormonal contraceptives: Secondary | ICD-10-CM | POA: Insufficient documentation

## 2016-01-25 DIAGNOSIS — Z87891 Personal history of nicotine dependence: Secondary | ICD-10-CM | POA: Diagnosis not present

## 2016-01-25 DIAGNOSIS — Z79899 Other long term (current) drug therapy: Secondary | ICD-10-CM | POA: Diagnosis not present

## 2016-01-25 DIAGNOSIS — E119 Type 2 diabetes mellitus without complications: Secondary | ICD-10-CM | POA: Diagnosis not present

## 2016-01-25 DIAGNOSIS — R079 Chest pain, unspecified: Secondary | ICD-10-CM | POA: Diagnosis present

## 2016-01-25 LAB — BASIC METABOLIC PANEL
Anion gap: 10 (ref 5–15)
BUN: 10 mg/dL (ref 6–20)
CO2: 23 mmol/L (ref 22–32)
Calcium: 8.8 mg/dL — ABNORMAL LOW (ref 8.9–10.3)
Chloride: 106 mmol/L (ref 101–111)
Creatinine, Ser: 0.96 mg/dL (ref 0.44–1.00)
GFR calc Af Amer: >60 mL/min (ref >60)
GFR calc non Af Amer: >60 mL/min (ref >60)
Glucose, Bld: 104 mg/dL — ABNORMAL HIGH (ref 65–99)
Potassium: 3.7 mmol/L (ref 3.5–5.1)
Sodium: 139 mmol/L (ref 135–145)

## 2016-01-25 LAB — CBC
HCT: 39.2 % (ref 36.0–46.0)
Hemoglobin: 13.3 g/dL (ref 12.0–15.0)
MCH: 30.4 pg (ref 26.0–34.0)
MCHC: 33.9 g/dL (ref 30.0–36.0)
MCV: 89.5 fL (ref 78.0–100.0)
Platelets: 229 10*3/uL (ref 150–400)
RBC: 4.38 MIL/uL (ref 3.87–5.11)
RDW: 12.9 % (ref 11.5–15.5)
WBC: 6.4 10*3/uL (ref 4.0–10.5)

## 2016-01-25 LAB — I-STAT TROPONIN, ED
Troponin i, poc: 0.01 ng/mL (ref 0.00–0.08)
Troponin i, poc: 0 ng/mL (ref 0.00–0.08)

## 2016-01-25 LAB — D-DIMER, QUANTITATIVE: D-Dimer, Quant: <0.27 ug/mL-FEU (ref 0.00–0.50)

## 2016-01-25 LAB — I-STAT BETA HCG BLOOD, ED (MC, WL, AP ONLY): I-stat hCG, quantitative: <5 m[IU]/mL (ref <5)

## 2016-01-25 MED ORDER — ASPIRIN 81 MG PO CHEW
324.0000 mg | CHEWABLE_TABLET | Freq: Once | ORAL | Status: AC
  Administered 2016-01-25: 324 mg via ORAL
  Filled 2016-01-25: qty 4

## 2016-01-25 MED ORDER — HYDROCODONE-ACETAMINOPHEN 5-325 MG PO TABS: ORAL_TABLET | ORAL | Status: DC

## 2016-01-25 NOTE — ED Provider Notes (Signed)
CSN: [id number]     Arrival date & time 01/25/16  0457 History   First MD Initiated Contact with Patient 01/25/16 [phone]     Chief Complaint  Patient presents with  . Chest Pain     (Consider location/radiation/quality/duration/timing/severity/associated sxs/prior Treatment) HPI   Blood pressure 160/93, pulse 111, temperature 98.8 F (37.1 C), temperature source Oral, resp. rate 20, height 5' 4"  (1.626 m), weight 75.297 kg, last menstrual period 12/28/2015, SpO2 100 %.  Brooke Turner is a 40 y.o. female complaining of 8 out of 10 aching left-sided chest pain radiating to the right side of the chest and right jaw onset 3 days ago becoming more frequent and more severe, woke her from sleep this a.m. She took Tylenol at home with little relief. She was evaluated at urgent care for BPPV which she states has resolved, she has residual nausea with no vomiting. States that the pain may be exacerbated by position or exertion. She states that the pain is associated with a chest itching. She denies family history of early cardiac death, former smoker with approximately 5-pack-year history. The patient had former diagnosis of diabetes and hypertension but is not taking medication for these. She seen a cardiologist in the past for palpitations, Holter monitor without any significant arrhythmia. Patient does take hormonal birth control pills. She drove to [place] weeks ago. She denies calf pain or leg swelling, hemoptysis, syncope, shortness of breath. Patient states she took a half milligram lorazepam before coming into the ED because she has a history of panic and thinks that being in the emergency department would make her nervous.  Past Medical History  Diagnosis Date  . Hypertension   . GERD (gastroesophageal reflux disease)   . Tachycardia   . Diabetes mellitus without complication ([place])   . Fibroid uterus   . Back pain   . Chest pain    Past Surgical History  Procedure Laterality Date   . Wisdom tooth extraction     Family History  Problem Relation Age of Onset  . Seizures Mother   . Cancer Father     Liver  . Diabetes Father 78  . Hypertension Father   . Heart disease Father 59    CEA, LE [place]  . Alzheimer's disease Maternal Grandmother   . Heart disease Maternal Grandfather    Social History  Substance Use Topics  . Smoking status: Former Smoker -- 0.25 packs/day for 10 years    Types: Cigarettes    Quit date: 08/19/2014  . Smokeless tobacco: Never Used  . Alcohol Use: No   OB History    Gravida Para Term Preterm AB TAB SAB Ectopic Multiple Living   2 0 0 0 2 1 1 0 0 0      Review of Systems  10 systems reviewed and found to be negative, except as noted in the HPI.   Allergies  Penicillins; Tamiflu; and Tinidazole  Home Medications   Prior to Admission medications   Medication Sig Start Date End Date Taking? Authorizing Provider  azithromycin (ZITHROMAX) 250 MG tablet Take 2 tabs PO x 1 dose, then 1 tab PO QD x 4 days 01/24/16 01/29/16  Ezekiel Slocumb, PA-C  blood glucose meter kit and supplies Test blood sugar twice daily. Dx code: R73.09 07/22/15   [name], MD  ergocalciferol (VITAMIN D2) 50000 UNITS capsule Take 1 capsule (50,000 Units total) by mouth once a week. 03/12/15   [name], MD  fluticasone Asencion Islam)  50 MCG/ACT nasal spray Place 1 spray into both nostrils daily as needed for allergies or rhinitis. 10/15/14   Chelle Jeffery, PA-C  glucose blood test strip Test blood sugar twice daily. Dx code: R73.09 07/22/15   [name], MD  LORazepam (ATIVAN) 0.5 MG tablet Take 1 tablet (0.5 mg total) by mouth 2 (two) times daily as needed for anxiety. 12/15/14   [name], MD  Norethindrone-Ethinyl Estradiol-Fe Biphas (LO LOESTRIN FE) 1 MG-10 MCG / 10 MCG tablet Take 1 tablet by mouth daily.    Historical Provider, MD  [facility] DELICA LANCETS 07W MISC Test blood sugar twice daily. Dx code: R73.09 07/22/15   [name], MD  Propylene Glycol (SYSTANE  BALANCE) 0.6 % SOLN Apply 1 drop to eye 3 (three) times daily as needed (for dry eyes).     Historical Provider, MD  ranitidine (ZANTAC) 150 MG tablet TAKE ONE TABLET BY MOUTH TWICE DAILY 01/24/16   [name], MD   BP 160/93 mmHg  Pulse 111  Temp(Src) 98.8 F (37.1 C) (Oral)  Resp 20  Ht 5' 4"  (1.626 m)  Wt 75.297 kg  BMI 28.48 kg/m2  SpO2 100%  LMP 12/28/2015 Physical Exam  Constitutional: She is oriented to person, place, and time. She appears well-developed and well-nourished. No distress.  HENT:  Head: Normocephalic.  Mouth/Throat: Oropharynx is clear and moist.  Eyes: Conjunctivae are normal.  Neck: Normal range of motion. No JVD present. No tracheal deviation present.  Cardiovascular: Normal rate, regular rhythm and intact distal pulses.   Radial pulse equal bilaterally  Pulmonary/Chest: Effort normal and breath sounds normal. No stridor. No respiratory distress. She has no wheezes. She has no rales. She exhibits no tenderness.  Abdominal: Soft. She exhibits no distension and no mass. There is no tenderness. There is no rebound and no guarding.  Musculoskeletal: Normal range of motion. She exhibits no edema or tenderness.  No calf asymmetry, superficial collaterals, palpable cords, edema, Homans sign negative bilaterally.    Neurological: She is alert and oriented to person, place, and time.  Skin: Skin is warm. She is not diaphoretic.  Psychiatric: She has a normal mood and affect.  Nursing note and vitals reviewed.   ED Course  Procedures (including critical care time) Labs Review Labs Reviewed  BASIC METABOLIC PANEL  CBC  I-STAT [place], ED  I-STAT BETA HCG BLOOD, ED ([facility], [facility], [facility] ONLY)    Imaging Review Dg Chest 2 View  01/25/2016  CLINICAL DATA:  Chest tightness and pressure. Dyspnea. Three days duration. EXAM: CHEST  2 VIEW COMPARISON:  10/19/2015 FINDINGS: The heart size and mediastinal contours are within normal limits. Both lungs are clear. The visualized  skeletal structures are unremarkable. IMPRESSION: No active cardiopulmonary disease. Electronically Signed   By: Andreas Newport M.D.   On: 01/25/2016 05:56   I have personally reviewed and evaluated these images and lab results as part of my medical decision-making.   EKG Interpretation   Date/Time:  Monday January 25 2016 05:09:24 EDT Ventricular Rate:  126 PR Interval:  154 QRS Duration: 83 QT Interval:  331 QTC Calculation: 479 R Axis:   52 Text Interpretation:  Sinus tachycardia Consider right atrial enlargement  Minimal ST depression, lateral leads Confirmed by DELO  MD, DOUGLAS  ([id number]) on 01/25/2016 5:40:43 AM      MDM   Final diagnoses:  Atypical chest pain   Filed Vitals:   01/25/16 0513 01/25/16 0918  BP: 160/93 128/82  Pulse:  111 71  Temp: 98.8 F (37.1 C) 99 F (37.2 C)  TempSrc: Oral Oral  Resp: 20 16  Height: 5' 4"  (1.626 m)   Weight: 75.297 kg   SpO2: 100% 99%    Medications  aspirin chewable tablet 324 mg (324 mg Oral Given 01/25/16 8676)    Brooke Turner is 40 y.o. female presenting with Atypical sounding chest pain. Patient is low risk by heart score. Nonspecific changes on EKG and troponin is negative. Considering patient's associated shortness of breath and recent trip, will check a d-dimer. She declines stronger pain medication, will give her full dose aspirin.  D-dimer, chest x-ray blood work negative, will delta troponin her.  Delta troponin negative. Advised her to follow with Dr. Percival Spanish.   Evaluation does not show pathology that would require ongoing emergent intervention or inpatient treatment. Pt is hemodynamically stable and mentating appropriately. Discussed findings and plan with patient/guardian, who agrees with care plan. All questions answered. Return precautions discussed and outpatient follow up given.       Monico Blitz, PA-C 01/25/16 [place], MD 01/25/16 [phone]

## 2016-01-25 NOTE — Telephone Encounter (Signed)
Brooke Turner is calling because she has a question that is important . Did not want to disclose the message .   Thanks

## 2016-01-25 NOTE — Telephone Encounter (Signed)
Patient has been experiencing chest pains since Friday - also vertigo Went to urgent care (they addressed vertigo) - went to ED last nite - patient was taking ibuprofen and tylenol for chest pain, unsure which to take - advised to take whichever provides her most relief and that the MD will advise her further at her appt 3/21 Chest pain - tightness BP higher at night 130s/100s Chest pain had woken her up Patient has neck and jaw pain   Patient has MD appt on 01/26/16 - advised her to keep this appt for eval of her acute concerns.

## 2016-01-25 NOTE — Discharge Instructions (Signed)
Take vicodin for breakthrough pain, do not drink alcohol, drive, care for children or do other critical tasks while taking vicodin.  Please follow with your primary care doctor in the next 2 days for a check-up. They must obtain records for further management.   Do not hesitate to return to the Emergency Department for any new, worsening or concerning symptoms.    Nonspecific Chest Pain  Chest pain can be caused by many different conditions. There is always a chance that your pain could be related to something serious, such as a heart attack or a blood clot in your lungs. Chest pain can also be caused by conditions that are not life-threatening. If you have chest pain, it is very important to follow up with your health care provider. CAUSES  Chest pain can be caused by:  Heartburn.  Pneumonia or bronchitis.  Anxiety or stress.  Inflammation around your heart (pericarditis) or lung (pleuritis or pleurisy).  A blood clot in your lung.  A collapsed lung (pneumothorax). It can develop suddenly on its own (spontaneous pneumothorax) or from trauma to the chest.  Shingles infection (varicella-zoster virus).  Heart attack.  Damage to the bones, muscles, and cartilage that make up your chest wall. This can include:  Bruised bones due to injury.  Strained muscles or cartilage due to frequent or repeated coughing or overwork.  Fracture to one or more ribs.  Sore cartilage due to inflammation (costochondritis). RISK FACTORS  Risk factors for chest pain may include:  Activities that increase your risk for trauma or injury to your chest.  Respiratory infections or conditions that cause frequent coughing.  Medical conditions or overeating that can cause heartburn.  Heart disease or family history of heart disease.  Conditions or health behaviors that increase your risk of developing a blood clot.  Having had chicken pox (varicella zoster). SIGNS AND SYMPTOMS Chest pain can feel  like:  Burning or tingling on the surface of your chest or deep in your chest.  Crushing, pressure, aching, or squeezing pain.  Dull or sharp pain that is worse when you move, cough, or take a deep breath.  Pain that is also felt in your back, neck, shoulder, or arm, or pain that spreads to any of these areas. Your chest pain may come and go, or it may stay constant. DIAGNOSIS Lab tests or other studies may be needed to find the cause of your pain. Your health care provider may have you take a test called an ambulatory ECG (electrocardiogram). An ECG records your heartbeat patterns at the time the test is performed. You may also have other tests, such as:  Transthoracic echocardiogram (TTE). During echocardiography, sound waves are used to create a picture of all of the heart structures and to look at how blood flows through your heart.  Transesophageal echocardiogram (TEE).This is a more advanced imaging test that obtains images from inside your body. It allows your health care provider to see your heart in finer detail.  Cardiac monitoring. This allows your health care provider to monitor your heart rate and rhythm in real time.  Holter monitor. This is a portable device that records your heartbeat and can help to diagnose abnormal heartbeats. It allows your health care provider to track your heart activity for several days, if needed.  Stress tests. These can be done through exercise or by taking medicine that makes your heart beat more quickly.  Blood tests.  Imaging tests. TREATMENT  Your treatment depends on what is  causing your chest pain. Treatment may include:  Medicines. These may include:  Acid blockers for heartburn.  Anti-inflammatory medicine.  Pain medicine for inflammatory conditions.  Antibiotic medicine, if an infection is present.  Medicines to dissolve blood clots.  Medicines to treat coronary artery disease.  Supportive care for conditions that do not  require medicines. This may include:  Resting.  Applying heat or cold packs to injured areas.  Limiting activities until pain decreases. HOME CARE INSTRUCTIONS  If you were prescribed an antibiotic medicine, finish it all even if you start to feel better.  Avoid any activities that bring on chest pain.  Do not use any tobacco products, including cigarettes, chewing tobacco, or electronic cigarettes. If you need help quitting, ask your health care provider.  Do not drink alcohol.  Take medicines only as directed by your health care provider.  Keep all follow-up visits as directed by your health care provider. This is important. This includes any further testing if your chest pain does not go away.  If heartburn is the cause for your chest pain, you may be told to keep your head raised (elevated) while sleeping. This reduces the chance that acid will go from your stomach into your esophagus.  Make lifestyle changes as directed by your health care provider. These may include:  Getting regular exercise. Ask your health care provider to suggest some activities that are safe for you.  Eating a heart-healthy diet. A registered dietitian can help you to learn healthy eating options.  Maintaining a healthy weight.  Managing diabetes, if necessary.  Reducing stress. SEEK MEDICAL CARE IF:  Your chest pain does not go away after treatment.  You have a rash with blisters on your chest.  You have a fever. SEEK IMMEDIATE MEDICAL CARE IF:   Your chest pain is worse.  You have an increasing cough, or you cough up blood.  You have severe abdominal pain.  You have severe weakness.  You faint.  You have chills.  You have sudden, unexplained chest discomfort.  You have sudden, unexplained discomfort in your arms, back, neck, or jaw.  You have shortness of breath at any time.  You suddenly start to sweat, or your skin gets clammy.  You feel nauseous or you vomit.  You  suddenly feel light-headed or dizzy.  Your heart begins to beat quickly, or it feels like it is skipping beats. These symptoms may represent a serious problem that is an emergency. Do not wait to see if the symptoms will go away. Get medical help right away. Call your local emergency services (911 in the [place] Do not drive yourself to the hospital.   This information is not intended to replace advice given to you by your health care provider. Make sure you discuss any questions you have with your health care provider.   Document Released: 08/03/2005 Document Revised: 11/14/2014 Document Reviewed: 05/30/2014 Elsevier Interactive Patient Education [employer].

## 2016-01-25 NOTE — ED Notes (Signed)
Patient states she has been having chest pain intermitently since Friday. Patient states the pain woke her up from her sleep this am. Patient seen at urgent care yesterday for chest pain and vertigo complaints, was dx with anxiety per notes in [facility]. Patient states she took tylenol for her pain on Friday without relief, no medications since then for this pain. Patient took 0.5mg  lorazepam at @0445 .

## 2016-01-26 ENCOUNTER — Ambulatory Visit (INDEPENDENT_AMBULATORY_CARE_PROVIDER_SITE_OTHER): Payer: 59 | Admitting: Cardiology

## 2016-01-26 ENCOUNTER — Encounter: Payer: Self-pay | Admitting: Cardiology

## 2016-01-26 VITALS — BP 168/80 | HR 68 | Ht 64.0 in | Wt 166.3 lb

## 2016-01-26 DIAGNOSIS — R799 Abnormal finding of blood chemistry, unspecified: Secondary | ICD-10-CM | POA: Diagnosis not present

## 2016-01-26 DIAGNOSIS — I10 Essential (primary) hypertension: Secondary | ICD-10-CM

## 2016-01-26 DIAGNOSIS — N289 Disorder of kidney and ureter, unspecified: Secondary | ICD-10-CM | POA: Diagnosis not present

## 2016-01-26 DIAGNOSIS — R7989 Other specified abnormal findings of blood chemistry: Secondary | ICD-10-CM

## 2016-01-26 DIAGNOSIS — R825 Elevated urine levels of drugs, medicaments and biological substances: Secondary | ICD-10-CM

## 2016-01-26 NOTE — Patient Instructions (Signed)
Your physician wants you to follow-up in: 3 Months You will receive a reminder letter in the mail two months in advance. If you don't receive a letter, please call our office to schedule the follow-up appointment.  Your physician recommends that you return for lab work   Your physician recommends that you wear a 24 hr Blood Pressure Cuff  Your physician has requested that you have a renal artery duplex. During this test, an ultrasound is used to evaluate blood flow to the kidneys. Allow one hour for this exam. Do not eat after midnight the day before and avoid carbonated beverages. Take your medications as you usually do.

## 2016-01-26 NOTE — Telephone Encounter (Signed)
Pt has been seen in our clinic by Brooke Turner, in the ER, and has an appt today with cards for her acute chest pain/HTN so will defer rxing to cardiology appt today.

## 2016-01-26 NOTE — Progress Notes (Signed)
HPI The patient presents for evaluation of dizziness and presyncope as well as chest pain. She has had an extensive workup for palpitations. Since I last saw her she has seen our PAs. She has been in the emergency room twice. I reviewed these records. She's had palpitations. She continues to have episodes of hypertension. She feels episodes where she becomes presyncopal when she is in a hot shower. She feels her heart pounding occasionally at night and doesn't think he can be related to anxiety because she's perfectly calm at those times. She's not had any syncope. She's not had any new PND or orthopnea. Other symptoms are as previously described.  Allergies  Allergen Reactions  . Penicillins Hives    Has patient had a PCN reaction causing immediate rash, facial/tongue/throat swelling, SOB or lightheadedness with hypotension:Yes Has patient had a PCN reaction causing severe rash involving mucus membranes or skin necrosis:unsure Has patient had a PCN reaction that required hospitalization: Yes Has patient had a PCN reaction occurring within the last 10 years: No If all of the above answers are "NO", then may proceed with Cephalosporin use.     . Tamiflu [Oseltamivir Phosphate] Nausea And Vomiting    Excessive vomiting  . Tinidazole     Caused severe abd pain/type of colitis     Current Outpatient Prescriptions  Medication Sig Dispense Refill  . blood glucose meter kit and supplies Test blood sugar twice daily. Dx code: R73.09 1 each 0  . ergocalciferol (VITAMIN D2) 50000 UNITS capsule Take 1 capsule (50,000 Units total) by mouth once a week. 12 capsule 1  . fluticasone (FLONASE) 50 MCG/ACT nasal spray Place into both nostrils as directed.    . fluticasone (FLONASE) 50 MCG/ACT nasal spray Place 1 spray into both nostrils as directed.    Marland Kitchen glucose blood test strip Test blood sugar twice daily. Dx code: R73.09 200 each 3  . HYDROcodone-acetaminophen (NORCO/VICODIN) 5-325 MG tablet Take  1-2 tablets by mouth every 6 hours as needed for pain and/or cough. 7 tablet 0  . LORazepam (ATIVAN) 0.5 MG tablet Take 0.5 mg by mouth as directed.    . Norethindrone-Ethinyl Estradiol-Fe Biphas (LO LOESTRIN FE) 1 MG-10 MCG / 10 MCG tablet Take 1 tablet by mouth daily.    [name] DELICA LANCETS 35W MISC Test blood sugar twice daily. Dx code: R73.09 200 each 3  . Propylene Glycol (SYSTANE BALANCE) 0.6 % SOLN Apply 1 drop to eye daily as needed (for dry eyes).     . ranitidine (ZANTAC) 150 MG tablet Take 150 mg by mouth daily.     No current facility-administered medications for this visit.    Past Medical History  Diagnosis Date  . Hypertension   . GERD (gastroesophageal reflux disease)   . Tachycardia   . Diabetes mellitus without complication ([place])   . Fibroid uterus   . Back pain   . Chest pain     Past Surgical History  Procedure Laterality Date  . Wisdom tooth extraction       ROS:  As stated in the HPI and negative for all other systems.  PHYSICAL EXAM BP 168/80 mmHg  Pulse 68  Ht _0  (1.626 m)  Wt 166 lb 4.8 oz (75.433 kg)  BMI 28.53 kg/m2  LMP 12/28/2015  GENERAL:  Well appearing NECK:  No jugular venous distention, waveform within normal limits, carotid upstroke brisk and symmetric, no bruits, no thyromegaly LUNGS:  Clear to auscultation bilaterally BACK:  No CVA  tenderness CHEST:  Unremarkable HEART:  PMI not displaced or sustained,S1 and S2 within normal limits, no S3, no S4, no clicks, no rubs, no murmurs ABD:  Flat, positive bowel sounds normal in frequency in pitch, no bruits, no rebound, no guarding, no midline pulsatile mass, no hepatomegaly, no splenomegaly EXT:  2 plus pulses throughout, no edema, no cyanosis no clubbing   ASSESSMENT AND PLAN  TACHYCARDIA/PALPITATIONS:  She's had a monitor without any significant dysrhythmias. No change in therapy is indicated. She is to be taking her Cardizem. We had a long discussion about anxiety/depression.  She actually has seen a psychiatrist who prescribed Zoloft but she didn't want to take this as she didn't think it was necessary. I told her that many of her symptoms can be a manifestation of anxiety clearly needs to follow-up  HTN:  I am going to check a 24-hour urine for VMA. I will check a 24-hour blood pressure monitor. She'll have renal arterial Dopplers to evaluate for possible renal artery stenosis.

## 2016-01-28 ENCOUNTER — Inpatient Hospital Stay (HOSPITAL_COMMUNITY): Admission: RE | Admit: 2016-01-28 | Payer: 59 | Source: Ambulatory Visit

## 2016-01-29 ENCOUNTER — Other Ambulatory Visit: Payer: Self-pay | Admitting: *Deleted

## 2016-01-29 ENCOUNTER — Telehealth: Payer: Self-pay | Admitting: Cardiology

## 2016-01-29 DIAGNOSIS — I10 Essential (primary) hypertension: Secondary | ICD-10-CM

## 2016-01-29 NOTE — Telephone Encounter (Signed)
Called patient and left a message for patient to call back relating to her 24-hour blood pressure cuff appointment.

## 2016-02-02 ENCOUNTER — Encounter: Payer: Self-pay | Admitting: *Deleted

## 2016-02-02 NOTE — Progress Notes (Signed)
Patient ID: Brooke Turner, female   DOB: 1976-08-31, 40 y.o.   MRN: [id number] Patient did not show up for 02/02/16, 2:30 PM appointment to have a 24 hour ambulatory blood pressure monitor applied.

## 2016-02-03 ENCOUNTER — Ambulatory Visit (HOSPITAL_COMMUNITY)
Admission: RE | Admit: 2016-02-03 | Discharge: 2016-02-03 | Disposition: A | Discharge status: Home / Self Care | Payer: 59 | Source: Ambulatory Visit | Attending: Cardiovascular Disease | Admitting: Cardiovascular Disease

## 2016-02-03 DIAGNOSIS — I774 Celiac artery compression syndrome: Secondary | ICD-10-CM | POA: Diagnosis not present

## 2016-02-03 DIAGNOSIS — N289 Disorder of kidney and ureter, unspecified: Secondary | ICD-10-CM | POA: Insufficient documentation

## 2016-02-03 DIAGNOSIS — I10 Essential (primary) hypertension: Secondary | ICD-10-CM | POA: Insufficient documentation

## 2016-02-03 DIAGNOSIS — K219 Gastro-esophageal reflux disease without esophagitis: Secondary | ICD-10-CM | POA: Insufficient documentation

## 2016-02-03 DIAGNOSIS — E119 Type 2 diabetes mellitus without complications: Secondary | ICD-10-CM | POA: Diagnosis not present

## 2016-02-04 ENCOUNTER — Telehealth: Payer: Self-pay | Admitting: Cardiology

## 2016-02-04 NOTE — Telephone Encounter (Signed)
Called the patient and left a voicemail message to call back and schedule her 24-hour blood pressure cuff appointment.

## 2016-02-05 ENCOUNTER — Telehealth: Payer: Self-pay | Admitting: Cardiology

## 2016-02-05 NOTE — Telephone Encounter (Signed)
Called and left another message for the patient to call and schedule her 24 hour ambulatory blood pressure monitor appt.

## 2016-02-07 ENCOUNTER — Ambulatory Visit (INDEPENDENT_AMBULATORY_CARE_PROVIDER_SITE_OTHER): Payer: 59 | Admitting: Family Medicine

## 2016-02-07 VITALS — BP 132/96 | HR 95 | Temp 98.3°F | Resp 16 | Ht 64.0 in | Wt 162.6 lb

## 2016-02-07 DIAGNOSIS — R7303 Prediabetes: Secondary | ICD-10-CM

## 2016-02-07 DIAGNOSIS — E559 Vitamin D deficiency, unspecified: Secondary | ICD-10-CM

## 2016-02-07 DIAGNOSIS — D500 Iron deficiency anemia secondary to blood loss (chronic): Secondary | ICD-10-CM | POA: Diagnosis not present

## 2016-02-07 DIAGNOSIS — E876 Hypokalemia: Secondary | ICD-10-CM | POA: Diagnosis not present

## 2016-02-07 DIAGNOSIS — R001 Bradycardia, unspecified: Secondary | ICD-10-CM

## 2016-02-07 DIAGNOSIS — N2581 Secondary hyperparathyroidism of renal origin: Secondary | ICD-10-CM | POA: Diagnosis not present

## 2016-02-07 LAB — COMPREHENSIVE METABOLIC PANEL
ALT: 13 U/L (ref 6–29)
AST: 16 U/L (ref 10–30)
Albumin: 4.5 g/dL (ref 3.6–5.1)
Alkaline Phosphatase: 52 U/L (ref 33–115)
BUN: 8 mg/dL (ref 7–25)
CO2: 24 mmol/L (ref 20–31)
Calcium: 9.2 mg/dL (ref 8.6–10.2)
Chloride: 106 mmol/L (ref 98–110)
Creat: 0.97 mg/dL (ref 0.50–1.10)
Glucose, Bld: 89 mg/dL (ref 65–99)
Potassium: 3.9 mmol/L (ref 3.5–5.3)
Sodium: 140 mmol/L (ref 135–146)
Total Bilirubin: 0.9 mg/dL (ref 0.2–1.2)
Total Protein: 7.3 g/dL (ref 6.1–8.1)

## 2016-02-07 LAB — CALCIUM, IONIZED: Calcium, Ion: 1.23 mmol/L (ref 1.12–1.32)

## 2016-02-07 NOTE — Patient Instructions (Addendum)
     IF you received an x-ray today, you will receive an invoice from [facility]. Please contact [facility] at [phone] with questions or concerns regarding your invoice.   IF you received labwork today, you will receive an invoice from [employer]. Please contact [facility] at [phone] with questions or concerns regarding your invoice.   Our billing staff will not be able to assist you with questions regarding bills from these companies.  You will be contacted with the lab results as soon as they are available. The fastest way to get your results is to activate your My Chart account. Instructions are located on the last page of this paperwork. If you have not heard from [place] regarding the results in 2 weeks, please contact this office.     Check out [facility]  Dr. Denton Lank

## 2016-02-07 NOTE — Progress Notes (Addendum)
Subjective:    Patient ID: Brooke Turner, female    DOB: [birth year], 40 y.o.   MRN: [id number] By signing my name below, I, Brooke Turner, attest that this documentation has been prepared under the direction and in the presence of Brooke Cheadle, MD. Electronically Signed: Judithe Turner, ER Scribe. 02/07/2016. 4:22 PM.  Chief Complaint  Patient presents with  . low heart rate    has been checking her pulse at home, and   . Dizziness    HPI HPI Comments: Brooke Turner is a 40 y.o. female who presents to [facility] complaining of low heart rate and dizziness that started two days ago and continued today. Her heart has been normal most of the time. She notices her low heart rate at the end of the day when she sits down to relax after a long day. She went to see her cardiologist one week ago, and she received an [place] on her kidneys. She is not drinking much caffeine and she is eating a low sodium diet. She had some vertigo two weeks ago and was seen at [facility], but after dicks [place]pike she felt better.   Stopped working out Was off on the lorazepam for a year and then at the end of Feb she noted an increase.  She did go home for a funeral - distant counsin, mom's dog dye Drive, shower  Vertigo  Heart rate dropping, sluggish Biofeedback with integrative therapies but difficult to get an appointment   Past Medical History  Diagnosis Date  . Hypertension   . GERD (gastroesophageal reflux disease)   . Tachycardia   . Diabetes mellitus without complication ([place])   . Fibroid uterus   . Back pain   . Chest pain    Allergies  Allergen Reactions  . Penicillins Hives    Has patient had a PCN reaction causing immediate rash, facial/tongue/throat swelling, SOB or lightheadedness with hypotension:Yes Has patient had a PCN reaction causing severe rash involving mucus membranes or skin necrosis:unsure Has patient had a PCN reaction that required hospitalization: Yes Has patient had  a PCN reaction occurring within the last 10 years: No If all of the above answers are "NO", then may proceed with Cephalosporin use.     . Tamiflu [Oseltamivir Phosphate] Nausea And Vomiting    Excessive vomiting  . Tinidazole     Caused severe abd pain/type of colitis    Current Outpatient Prescriptions on File Prior to Visit  Medication Sig Dispense Refill  . blood glucose meter kit and supplies Test blood sugar twice daily. Dx code: R73.09 1 each 0  . ergocalciferol (VITAMIN D2) 50000 UNITS capsule Take 1 capsule (50,000 Units total) by mouth once a week. 12 capsule 1  . fluticasone (FLONASE) 50 MCG/ACT nasal spray Place into both nostrils as directed.    . fluticasone (FLONASE) 50 MCG/ACT nasal spray Place 1 spray into both nostrils as directed.    Marland Kitchen glucose blood test strip Test blood sugar twice daily. Dx code: R73.09 200 each 3  . HYDROcodone-acetaminophen (NORCO/VICODIN) 5-325 MG tablet Take 1-2 tablets by mouth every 6 hours as needed for pain and/or cough. 7 tablet 0  . LORazepam (ATIVAN) 0.5 MG tablet Take 0.5 mg by mouth as directed.    . Norethindrone-Ethinyl Estradiol-Fe Biphas (LO LOESTRIN FE) 1 MG-10 MCG / 10 MCG tablet Take 1 tablet by mouth daily.    Brooke Turner DELICA LANCETS 24O MISC Test blood sugar twice daily. Dx code: R73.09  200 each 3  . Propylene Glycol (SYSTANE BALANCE) 0.6 % SOLN Apply 1 drop to eye daily as needed (for dry eyes).     . ranitidine (ZANTAC) 150 MG tablet Take 150 mg by mouth daily.     No current facility-administered medications on file prior to visit.   Past Surgical History  Procedure Laterality Date  . Wisdom tooth extraction     Family History  Problem Relation Age of Onset  . Seizures Mother   . Cancer Father     Liver  . Diabetes Father 1  . Hypertension Father   . Heart disease Father 52    CEA, LE Stenting  . Alzheimer's disease Maternal Grandmother   . Heart disease Maternal Grandfather    Social History   Social  History  . Marital Status: Single    Spouse Name: N/A  . Number of Children: 0  . Years of Education: 16   Occupational History  . Customer Service [employer]   Social History Main Topics  . Smoking status: Former Smoker -- 0.25 packs/day for 10 years    Types: Cigarettes    Quit date: 08/19/2014  . Smokeless tobacco: Never Used  . Alcohol Use: No  . Drug Use: No  . Sexual Activity:    Partners: Male    Birth Control/ Protection: Pill   Other Topics Concern  . None   Social History Narrative   Patient lives at home alone .   Patient works full time at [employer]   Education college.   Right handed.   Caffeine none   Depression screen [facility] 2/9 02/07/2016 01/24/2016 11/22/2015 09/14/2015 05/25/2015  Decreased Interest 0 0 0 0 0  Down, Depressed, Hopeless 0 0 0 0 0  PHQ - 2 Score 0 0 0 0 0      Review of Systems  Constitutional: Positive for diaphoresis, activity change and fatigue. Negative for fever and chills.  Respiratory: Positive for chest tightness.   Cardiovascular: Positive for chest pain and palpitations. Negative for leg swelling.  Gastrointestinal: Negative for nausea, vomiting, abdominal pain, diarrhea and constipation.  Genitourinary: Positive for genital sores.  Musculoskeletal: Negative for myalgias, joint swelling, arthralgias and gait problem.  Skin: Negative for rash.  Allergic/Immunologic: Negative for immunocompromised state.  Neurological: Positive for dizziness and light-headedness. Negative for facial asymmetry and headaches.  Hematological: Negative for adenopathy.  Psychiatric/Behavioral: Positive for behavioral problems. Negative for hallucinations, sleep disturbance, self-injury and dysphoric mood. The patient is nervous/anxious. The patient is not hyperactive.       Objective:  BP 132/96 mmHg  Pulse 95  Temp(Src) 98.3 F (36.8 C) (Oral)  Resp 16  Ht _0  (1.626 m)  Wt 162 lb 9.6 oz (73.755 kg)  BMI 27.90 kg/m2  SpO2 99%  LMP  01/30/2016  Physical Exam  Constitutional: She is oriented to person, place, and time. She appears well-developed and well-nourished. No distress.  HENT:  Head: Normocephalic and atraumatic.  Eyes: Pupils are equal, round, and reactive to light.  Neck: Neck supple.  Cardiovascular: Normal rate.   Pulmonary/Chest: Effort normal. No respiratory distress.  Musculoskeletal: Normal range of motion.  Neurological: She is alert and oriented to person, place, and time. Coordination normal.  Skin: Skin is warm and dry. She is not diaphoretic.  Psychiatric: Her speech is normal and behavior is normal. Her mood appears anxious. Thought content is paranoid. Cognition and memory are normal. She expresses inappropriate judgment. She expresses no suicidal plans.  Nursing note  and vitals reviewed.     Assessment & Plan:   1. Bradycardia   2. Hypokalemia   3. Hypocalcemia - recheck after vitamin D replacement as labs currently c/w secondary hyperparathyroidism  4. Vitamin D deficiency   5. Prediabetes   6. Iron deficiency anemia due to chronic blood loss   7. Secondary hyperparathyroidism ([place])   Had neg spep and ife prev. Is going to collect a 24 hr urine for vma and metanephrines ordered by cards.  Pt going to contact cards again to pursue getting another 24 hr amb bp monitor.    She is EXTREMELY concerned/paranoid that her heart is going to slow to the point of stopping while she is sleeping.  She has been prescribed sev different BB over the past yr - most recently carvedilol which she had to wean off sev mos ago due to fatigue and nml BP.  Pt reassured MANY times that this is really not a concern she should have and that her reported pulse and BP and the changes of these are all normal   Pt has continued with weightloss - she has been working out reg and eating healthy but not trying to loose - likes it but not sure why weight is continuing to fall. Pt never tried zoloft that a psych provider at  mood treatment center at [facility] prescribed to her. She realizes she has been taking more ativan than prior for that month or so - still not more than several times/wk when her health sxs overwhelm her, does not drive while on it, but she would like to decrease her ativan use.  Had second sleep medicine c/s who confirmed that sleep and respiratory status are normal. Recommend pt try biofeedback again - experience at [facility] was not to helpful but encouraged pt to re-establish with a therapist and look into the Neurofeedback Asts with Dr. Denton Lank.   Orders Placed This Encounter  Procedures  . Calcium, ionized  . PTH, Intact and Calcium  . VITAMIN D 25 Hydroxy (Vit-D Deficiency, Fractures)  . Comprehensive metabolic panel  . High sensitivity CRP  . Ferritin  . Hemoglobin A1c  . C-reactive protein    Meds ordered this encounter  Medications  . ergocalciferol (VITAMIN D2) 50000 units capsule    Sig: Take 1 capsule (50,000 Units total) by mouth once a week.    Dispense:  12 capsule    Refill:  1   Over 40 min spent in face-to-face evaluation of and consultation with patient and coordination of care.  Over 50% of this time was spent counseling this patient.  I personally performed the services described in this documentation, which was scribed in my presence. The recorded information has been reviewed and considered, and addended by me as needed.  Brooke Cheadle, MD MPH

## 2016-02-08 ENCOUNTER — Telehealth: Payer: Self-pay | Admitting: Cardiology

## 2016-02-08 LAB — HEMOGLOBIN A1C
Hgb A1c MFr Bld: 5.8 % — ABNORMAL HIGH (ref <5.7)
Mean Plasma Glucose: 120 mg/dL

## 2016-02-08 LAB — PTH, INTACT AND CALCIUM
Calcium: 9.2 mg/dL (ref 8.4–10.5)
PTH: 81 pg/mL — ABNORMAL HIGH (ref 14–64)

## 2016-02-08 LAB — VITAMIN D 25 HYDROXY (VIT D DEFICIENCY, FRACTURES): Vit D, 25-Hydroxy: 24 ng/mL — ABNORMAL LOW (ref 30–100)

## 2016-02-08 LAB — HIGH SENSITIVITY CRP: CRP, High Sensitivity: 4.2 mg/L — ABNORMAL HIGH

## 2016-02-08 LAB — C-REACTIVE PROTEIN: CRP: <0.5 mg/dL (ref <0.60)

## 2016-02-08 LAB — FERRITIN: Ferritin: 17 ng/mL (ref 10–232)

## 2016-02-08 NOTE — Telephone Encounter (Signed)
New message    Pt heart rate has been low in the 50's   She stats she is returning call

## 2016-02-08 NOTE — Telephone Encounter (Signed)
Confirmed appt for holter monitor and gave advice on symptoms. Pt voiced understanding.

## 2016-02-10 ENCOUNTER — Encounter: Payer: Self-pay | Admitting: *Deleted

## 2016-02-10 ENCOUNTER — Ambulatory Visit: Payer: 59

## 2016-02-10 DIAGNOSIS — I10 Essential (primary) hypertension: Secondary | ICD-10-CM

## 2016-02-10 MED ORDER — ERGOCALCIFEROL 1.25 MG (50000 UT) PO CAPS: 50000.0000 [IU] | ORAL_CAPSULE | ORAL | Status: DC

## 2016-02-10 NOTE — Progress Notes (Signed)
Patient ID: Brooke Turner, female   DOB: [birth year], 40 y.o.   MRN: [id number] 24 hour ambulatory blood pressure monitor applied to patient.

## 2016-02-11 ENCOUNTER — Telehealth: Payer: Self-pay | Admitting: *Deleted

## 2016-02-11 ENCOUNTER — Emergency Department (HOSPITAL_COMMUNITY)
Admission: EM | Admit: 2016-02-11 | Discharge: 2016-02-12 | Disposition: A | Discharge status: Home / Self Care | Payer: 59 | Attending: Emergency Medicine | Admitting: Emergency Medicine

## 2016-02-11 ENCOUNTER — Telehealth: Payer: Self-pay | Admitting: Cardiology

## 2016-02-11 ENCOUNTER — Encounter (HOSPITAL_COMMUNITY): Payer: Self-pay | Admitting: *Deleted

## 2016-02-11 DIAGNOSIS — Z79899 Other long term (current) drug therapy: Secondary | ICD-10-CM | POA: Diagnosis not present

## 2016-02-11 DIAGNOSIS — F419 Anxiety disorder, unspecified: Secondary | ICD-10-CM | POA: Diagnosis not present

## 2016-02-11 DIAGNOSIS — Z7951 Long term (current) use of inhaled steroids: Secondary | ICD-10-CM | POA: Diagnosis not present

## 2016-02-11 DIAGNOSIS — Z87891 Personal history of nicotine dependence: Secondary | ICD-10-CM | POA: Insufficient documentation

## 2016-02-11 DIAGNOSIS — R6883 Chills (without fever): Secondary | ICD-10-CM | POA: Diagnosis not present

## 2016-02-11 DIAGNOSIS — R42 Dizziness and giddiness: Secondary | ICD-10-CM

## 2016-02-11 DIAGNOSIS — R110 Nausea: Secondary | ICD-10-CM | POA: Diagnosis not present

## 2016-02-11 DIAGNOSIS — I10 Essential (primary) hypertension: Secondary | ICD-10-CM | POA: Insufficient documentation

## 2016-02-11 DIAGNOSIS — E119 Type 2 diabetes mellitus without complications: Secondary | ICD-10-CM | POA: Diagnosis not present

## 2016-02-11 DIAGNOSIS — K219 Gastro-esophageal reflux disease without esophagitis: Secondary | ICD-10-CM | POA: Diagnosis not present

## 2016-02-11 DIAGNOSIS — Z79818 Long term (current) use of other agents affecting estrogen receptors and estrogen levels: Secondary | ICD-10-CM | POA: Diagnosis not present

## 2016-02-11 DIAGNOSIS — Z3202 Encounter for pregnancy test, result negative: Secondary | ICD-10-CM | POA: Diagnosis not present

## 2016-02-11 DIAGNOSIS — Z880 Allergy status to penicillin: Secondary | ICD-10-CM | POA: Diagnosis not present

## 2016-02-11 DIAGNOSIS — Z86018 Personal history of other benign neoplasm: Secondary | ICD-10-CM | POA: Insufficient documentation

## 2016-02-11 DIAGNOSIS — N281 Cyst of kidney, acquired: Secondary | ICD-10-CM

## 2016-02-11 LAB — COMPREHENSIVE METABOLIC PANEL
ALT: 14 U/L (ref 14–54)
AST: 19 U/L (ref 15–41)
Albumin: 3.8 g/dL (ref 3.5–5.0)
Alkaline Phosphatase: 48 U/L (ref 38–126)
Anion gap: 12 (ref 5–15)
BUN: 11 mg/dL (ref 6–20)
CO2: 22 mmol/L (ref 22–32)
Calcium: 8.8 mg/dL — ABNORMAL LOW (ref 8.9–10.3)
Chloride: 105 mmol/L (ref 101–111)
Creatinine, Ser: 1.13 mg/dL — ABNORMAL HIGH (ref 0.44–1.00)
GFR calc Af Amer: >60 mL/min (ref >60)
GFR calc non Af Amer: 60 mL/min — ABNORMAL LOW (ref >60)
Glucose, Bld: 117 mg/dL — ABNORMAL HIGH (ref 65–99)
Potassium: 3.5 mmol/L (ref 3.5–5.1)
Sodium: 139 mmol/L (ref 135–145)
Total Bilirubin: 0.7 mg/dL (ref 0.3–1.2)
Total Protein: 7.1 g/dL (ref 6.5–8.1)

## 2016-02-11 LAB — CBC WITH DIFFERENTIAL/PLATELET
Basophils Absolute: 0 10*3/uL (ref 0.0–0.1)
Basophils Relative: 0 %
Eosinophils Absolute: 0 10*3/uL (ref 0.0–0.7)
Eosinophils Relative: 1 %
HCT: 40.8 % (ref 36.0–46.0)
Hemoglobin: 13.7 g/dL (ref 12.0–15.0)
Lymphocytes Relative: 48 %
Lymphs Abs: 4.2 10*3/uL — ABNORMAL HIGH (ref 0.7–4.0)
MCH: 29.7 pg (ref 26.0–34.0)
MCHC: 33.6 g/dL (ref 30.0–36.0)
MCV: 88.3 fL (ref 78.0–100.0)
Monocytes Absolute: 0.5 10*3/uL (ref 0.1–1.0)
Monocytes Relative: 5 %
Neutro Abs: 4 10*3/uL (ref 1.7–7.7)
Neutrophils Relative %: 46 %
Platelets: 238 10*3/uL (ref 150–400)
RBC: 4.62 MIL/uL (ref 3.87–5.11)
RDW: 12.8 % (ref 11.5–15.5)
WBC: 8.7 10*3/uL (ref 4.0–10.5)

## 2016-02-11 NOTE — ED Notes (Signed)
The pt is c/o dizziness that started a little  While ago feeling faint not getting her breath very well   .  She has a history of panic attacks.  She saw her  Doctor  2 days ago and had some blood work drawn.  She reports that when she looks into the mirror her pupils look too  Big.  She feels like she is going to jump out of her skin.  She took an ativan just pta she has also had nausea  lmp 2 days ago

## 2016-02-11 NOTE — Telephone Encounter (Signed)
Not recommended to wear blood pressure monitor over clothing , however, it would probably pick up ok if over a thin blouse or shirt.

## 2016-02-11 NOTE — Telephone Encounter (Signed)
Pt got BP monitor yesterday-wants to know if ok to put on over clothing-has to go to work-pls leave msg [phone]

## 2016-02-12 LAB — URINALYSIS, ROUTINE W REFLEX MICROSCOPIC
Bilirubin Urine: NEGATIVE
Glucose, UA: NEGATIVE mg/dL
Ketones, ur: NEGATIVE mg/dL
Nitrite: NEGATIVE
Protein, ur: NEGATIVE mg/dL
Specific Gravity, Urine: 1.012 (ref 1.005–1.030)
pH: 6 (ref 5.0–8.0)

## 2016-02-12 LAB — URINE MICROSCOPIC-ADD ON: Bacteria, UA: NONE SEEN

## 2016-02-12 LAB — PREGNANCY, URINE: Preg Test, Ur: NEGATIVE

## 2016-02-12 NOTE — Discharge Instructions (Signed)
FOLLOW UP AS PLANNED WITH CARDIOLOGY AND WITH YOUR PRIMARY CARE DOCTOR AS NEEDED FOR FURTHER OUTPATIENT EVALUATION OF SYMPTOMS. RETURN TO THE EMERGENCY DEPARTMENT WITH ANY NEW SYMPTOMS OR CONCERN.

## 2016-02-12 NOTE — ED Provider Notes (Signed)
CSN: [id number]     Arrival date & time 02/11/16  2137 History   First MD Initiated Contact with Patient 02/11/16 2318     Chief Complaint  Patient presents with  . Anxiety     (Consider location/radiation/quality/duration/timing/severity/associated sxs/prior Treatment) HPI Comments: Feeling lightheaded yesterday, with worsening symptoms today including sudden onset chills, nausea without vomiting. She felt like she was going to pass out but had no full syncope. No change in appetite or eating habits today. She feels better now. Took an Ativan prior to improvement in symptoms. Has had symptoms of low heart rate in the last several days and concerns for elevated blood pressure. She monitored her blood pressure with a cuff x 24 hours was provided by a cardiologist, which she did yesterday and today. She is currently asymptomatic.  Patient is a 40 y.o. female presenting with anxiety. The history is provided by the patient. No language interpreter was used.  Anxiety Associated symptoms include chills, nausea and weakness. Pertinent negatives include no abdominal pain, chest pain, coughing, fever or vomiting.    Past Medical History  Diagnosis Date  . Hypertension   . GERD (gastroesophageal reflux disease)   . Tachycardia   . Diabetes mellitus without complication ([place])   . Fibroid uterus   . Back pain   . Chest pain    Past Surgical History  Procedure Laterality Date  . Wisdom tooth extraction     Family History  Problem Relation Age of Onset  . Seizures Mother   . Cancer Father     Liver  . Diabetes Father 35  . Hypertension Father   . Heart disease Father 84    CEA, LE Stenting  . Alzheimer's disease Maternal Grandmother   . Heart disease Maternal Grandfather    Social History  Substance Use Topics  . Smoking status: Former Smoker -- 0.25 packs/day for 10 years    Types: Cigarettes    Quit date: 08/19/2014  . Smokeless tobacco: Never Used  . Alcohol Use: No   OB  History    Gravida Para Term Preterm AB TAB SAB Ectopic Multiple Living   2 0 0 0 2 1 1 0 0 0      Review of Systems  Constitutional: Positive for chills. Negative for fever, activity change and appetite change.  HENT: Negative.   Respiratory: Negative.  Negative for cough, shortness of breath and wheezing.        She felt like her respiratory rate was low earlier today.  Cardiovascular: Negative.  Negative for chest pain.  Gastrointestinal: Positive for nausea. Negative for vomiting and abdominal pain.  Genitourinary: Negative.   Musculoskeletal: Negative.   Skin: Negative.   Neurological: Positive for weakness and light-headedness.  Psychiatric/Behavioral: The patient is nervous/anxious.       Allergies  Penicillins; Tamiflu; and Tinidazole  Home Medications   Prior to Admission medications   Medication Sig Start Date End Date Taking? Authorizing Provider  blood glucose meter kit and supplies Test blood sugar twice daily. Dx code: R73.09 07/22/15   [name], MD  ergocalciferol (VITAMIN D2) 50000 units capsule Take 1 capsule (50,000 Units total) by mouth once a week. 02/10/16   [name], MD  fluticasone (FLONASE) 50 MCG/ACT nasal spray Place into both nostrils as directed.    Historical Provider, MD  fluticasone (FLONASE) 50 MCG/ACT nasal spray Place 1 spray into both nostrils as directed.    Historical Provider, MD  glucose blood test strip Test blood  sugar twice daily. Dx code: R73.09 07/22/15   [name], MD  HYDROcodone-acetaminophen (NORCO/VICODIN) 5-325 MG tablet Take 1-2 tablets by mouth every 6 hours as needed for pain and/or cough. 01/25/16   Nicole Pisciotta, PA-C  LORazepam (ATIVAN) 0.5 MG tablet Take 0.5 mg by mouth as directed.    Historical Provider, MD  Norethindrone-Ethinyl Estradiol-Fe Biphas (LO LOESTRIN FE) 1 MG-10 MCG / 10 MCG tablet Take 1 tablet by mouth daily.    Historical Provider, MD  [facility] DELICA LANCETS 16X MISC Test blood sugar twice daily. Dx code:  R73.09 07/22/15   [name], MD  Propylene Glycol (SYSTANE BALANCE) 0.6 % SOLN Apply 1 drop to eye daily as needed (for dry eyes).     Historical Provider, MD  ranitidine (ZANTAC) 150 MG tablet Take 150 mg by mouth daily.    Historical Provider, MD   BP 205/90 mmHg  Pulse 102  Temp(Src) 97.7 F (36.5 C) (Oral)  Resp 16  Ht 5' 3"  (1.6 m)  Wt 75.382 kg  BMI 29.45 kg/m2  SpO2 100%  LMP 01/30/2016 Physical Exam  Constitutional: She is oriented to person, place, and time. She appears well-developed and well-nourished.  HENT:  Head: Normocephalic.  Neck: Normal range of motion. Neck supple.  Cardiovascular: Normal rate and regular rhythm.   No murmur heard. Pulmonary/Chest: Effort normal and breath sounds normal.  Abdominal: Soft. Bowel sounds are normal. There is no tenderness. There is no rebound and no guarding.  Musculoskeletal: Normal range of motion. She exhibits no edema.  Neurological: She is alert and oriented to person, place, and time. Coordination normal.  CN's 3-12 intact. Ambulatory without ataxia. No deficits of coordination. Speech clear and focused.  Skin: Skin is warm and dry. No rash noted.  Psychiatric: She has a normal mood and affect.    ED Course  Procedures (including critical care time) Labs Review Labs Reviewed  CBC WITH DIFFERENTIAL/PLATELET - Abnormal; Notable for the following:    Lymphs Abs 4.2 (*)    All other components within normal limits  COMPREHENSIVE METABOLIC PANEL - Abnormal; Notable for the following:    Glucose, Bld 117 (*)    Creatinine, Ser 1.13 (*)    Calcium 8.8 (*)    GFR calc non Af Amer 60 (*)    All other components within normal limits  URINALYSIS, ROUTINE W REFLEX MICROSCOPIC (NOT AT [facility])  PREGNANCY, URINE   Results for orders placed or performed during the hospital encounter of 02/11/16  CBC with Differential  Result Value Ref Range   WBC 8.7 4.0 - 10.5 K/uL   RBC 4.62 3.87 - 5.11 MIL/uL   Hemoglobin 13.7 12.0 - 15.0  g/dL   HCT 40.8 36.0 - 46.0 %   MCV 88.3 78.0 - 100.0 fL   MCH 29.7 26.0 - 34.0 pg   MCHC 33.6 30.0 - 36.0 g/dL   RDW 12.8 11.5 - 15.5 %   Platelets 238 150 - 400 K/uL   Neutrophils Relative % 46 %   Neutro Abs 4.0 1.7 - 7.7 K/uL   Lymphocytes Relative 48 %   Lymphs Abs 4.2 (H) 0.7 - 4.0 K/uL   Monocytes Relative 5 %   Monocytes Absolute 0.5 0.1 - 1.0 K/uL   Eosinophils Relative 1 %   Eosinophils Absolute 0.0 0.0 - 0.7 K/uL   Basophils Relative 0 %   Basophils Absolute 0.0 0.0 - 0.1 K/uL  Comprehensive metabolic panel  Result Value Ref Range   Sodium  139 135 - 145 mmol/L   Potassium 3.5 3.5 - 5.1 mmol/L   Chloride 105 101 - 111 mmol/L   CO2 22 22 - 32 mmol/L   Glucose, Bld 117 (H) 65 - 99 mg/dL   BUN 11 6 - 20 mg/dL   Creatinine, Ser 1.13 (H) 0.44 - 1.00 mg/dL   Calcium 8.8 (L) 8.9 - 10.3 mg/dL   Total Protein 7.1 6.5 - 8.1 g/dL   Albumin 3.8 3.5 - 5.0 g/dL   AST 19 15 - 41 U/L   ALT 14 14 - 54 U/L   Alkaline Phosphatase 48 38 - 126 U/L   Total Bilirubin 0.7 0.3 - 1.2 mg/dL   GFR calc non Af Amer 60 (L) >60 mL/min   GFR calc Af Amer >60 >60 mL/min   Anion gap 12 5 - 15  Urinalysis, Routine w reflex microscopic  Result Value Ref Range   Color, Urine YELLOW YELLOW   APPearance CLOUDY (A) CLEAR   Specific Gravity, Urine 1.012 1.005 - 1.030   pH 6.0 5.0 - 8.0   Glucose, UA NEGATIVE NEGATIVE mg/dL   Hgb urine dipstick MODERATE (A) NEGATIVE   Bilirubin Urine NEGATIVE NEGATIVE   Ketones, ur NEGATIVE NEGATIVE mg/dL   Protein, ur NEGATIVE NEGATIVE mg/dL   Nitrite NEGATIVE NEGATIVE   Leukocytes, UA SMALL (A) NEGATIVE  Pregnancy, urine  Result Value Ref Range   Preg Test, Ur NEGATIVE NEGATIVE  Urine microscopic-add on  Result Value Ref Range   Squamous Epithelial / LPF 0-5 (A) NONE SEEN   WBC, UA 0-5 0 - 5 WBC/hpf   RBC / HPF 0-5 0 - 5 RBC/hpf   Bacteria, UA NONE SEEN NONE SEEN    Imaging Review No results found. I have personally reviewed and evaluated these images  and lab results as part of my medical decision-making.   EKG Interpretation   Date/Time:  Thursday February 11 2016 21:56:18 EDT Ventricular Rate:  95 PR Interval:  164 QRS Duration: 80 QT Interval:  342 QTC Calculation: 429 R Axis:   61 Text Interpretation:  Normal sinus rhythm with sinus arrhythmia Normal ECG  Confirmed by BEATON  MD, ROBERT ([id number]) on 02/11/2016 11:23:56 PM      MDM   Final diagnoses:  None    1. Lightheadedness 2. Anxiety  The patient is asymptomatic. Labs and EKG here are unremarkable for abnormality. VSS, slightly hypertensive. She is felt appropriate for discharge home and is encouraged to return with any symptoms of concern that may develop or recur. She will follow up with cardiology as planned for results of blood pressure monitoring.    Charlann Lange, PA-C 02/12/16 0112  Leonard Schwartz, MD 02/12/16 [phone]

## 2016-02-16 NOTE — Telephone Encounter (Signed)
Spoke with patient regarding renal artery duplex and need for renal u/s  Test ordered  Staff message sent to scheduling pool to arrange.

## 2016-02-16 NOTE — Telephone Encounter (Signed)
New message    Pt is calling she states that her mother received a vm from our office  And she is returning call to rn, to schedule CT of her kidneys   Please leave detailed message on the vm

## 2016-02-18 ENCOUNTER — Telehealth: Payer: Self-pay | Admitting: Cardiology

## 2016-02-18 NOTE — Telephone Encounter (Signed)
Left message yesterday and today regarding date and time and location for renal ultrasound.

## 2016-02-22 ENCOUNTER — Ambulatory Visit
Admission: RE | Admit: 2016-02-22 | Discharge: 2016-02-22 | Disposition: A | Discharge status: Home / Self Care | Payer: 59 | Source: Ambulatory Visit | Attending: Cardiology | Admitting: Cardiology

## 2016-02-22 ENCOUNTER — Other Ambulatory Visit: Payer: 59

## 2016-02-22 DIAGNOSIS — N281 Cyst of kidney, acquired: Secondary | ICD-10-CM

## 2016-02-29 ENCOUNTER — Telehealth: Payer: Self-pay

## 2016-02-29 NOTE — Telephone Encounter (Signed)
Pt states she is at lab now and stated Dr Brigitte Pulse was going to place an order for her to get Urine tested, it should have been done on 4/2 and she need someone to get In touch with Dr Brigitte Pulse asap since she is at lab now. Please call pt at [phone] and the number to the lab is [phone]

## 2016-03-02 ENCOUNTER — Other Ambulatory Visit: Payer: Self-pay | Admitting: Family Medicine

## 2016-03-03 ENCOUNTER — Telehealth: Payer: Self-pay | Admitting: Cardiology

## 2016-03-03 NOTE — Telephone Encounter (Signed)
New message  Pt simply request a call back to discuss BP  Pt c/o BP issue:  1. What are your last 5 BP readings?   129/61 today at 1pm 111/64 today at 12:50p   2. Are you having any other symptoms (ex. Dizziness, headache, blurred vision, passed out)? Dizziness and Lightheadedness   3. What is your medication issue? Not sure

## 2016-03-03 NOTE — Telephone Encounter (Signed)
Follow Up ° °Pt returned call//  °

## 2016-03-03 NOTE — Telephone Encounter (Signed)
Spoke with patient again after phones were disconnected: Patient states she checked her BP before she left work and it was 128/70 -- states her diastolic BP is normally in the 80s-90s - her previous readings documented below were in the 60s -- states the diastolic blood pressure being low is what is concerning to her and wants to know if this can cause her to feel lightheaded. -- states burning & a little pain in her chest - patient states random throughout the day - on and off - new as of yesterday -- states "kind of" shortness of breath - feels like she has to breathe in deep a couple of times, like she is running out of air -- states she likely would have not paid attention to these symptoms if her BP was not low  -- states she felt clammy like she could pass out and drank water and ate something with salt to bring her BP up -- states she has heightened anxiety related to her blood pressure and is ready to go to the ED if need be.   -- asked if patient had anti-anxiety medication to take, but she states this lowers her BP and she took it one day and was a "walking zombie"  Patient states she has not received her 24 blood pressure monitor results  Attempted to reassure patient that her BP is likely not low enough to cause her to pass out but informed her that I personally do not have all the answers to her concerns and would have to defer to the MD Informed her that her concerns would be routed to Dr. Percival Spanish to review

## 2016-03-03 NOTE — Telephone Encounter (Signed)
LMTCB

## 2016-03-03 NOTE — Telephone Encounter (Signed)
Patient has a follow up with MD for renal doppler & 24 hour urine results Patient states her BP has been running low and her HR gets faster - feels lightheaded/dizzy. Feels like she may pass out but has not Was reviewing BP readings and having reception issues - patient's phone disconnected.  Attempted to return call to patient - LM

## 2016-03-04 NOTE — Telephone Encounter (Signed)
The BP she is reporting is normal.  I do not see any change in meds that I would suggest.  She needs to have treatment of her anxiety as she and I discussed at length.

## 2016-03-04 NOTE — Telephone Encounter (Signed)
Leave message for pt to call back 

## 2016-03-07 NOTE — Telephone Encounter (Signed)
Leave message for pt to call back 

## 2016-03-08 NOTE — Telephone Encounter (Signed)
Follow Up ° °Pt called back  °

## 2016-03-10 LAB — METANEPHRINES, URINE, 24 HOUR
Metaneph Total, Ur: 305 mcg/24 h (ref 182–739)
Metanephrines, Ur: 77 mcg/24 h (ref 58–203)
Normetanephrine, 24H Ur: 228 mcg/24 h (ref 88–649)

## 2016-03-10 NOTE — Telephone Encounter (Signed)
Pt would like a call back please. °

## 2016-03-10 NOTE — Telephone Encounter (Signed)
Spoke with pt, advised pt if she need to see Dr Percival Spanish soon than her schedule appt i will put her on cancellation list, seeing that Dr Percival Spanish is booked until June, pt voice understanding

## 2016-03-17 ENCOUNTER — Other Ambulatory Visit: Payer: Self-pay | Admitting: Family Medicine

## 2016-03-20 ENCOUNTER — Encounter (HOSPITAL_COMMUNITY): Payer: Self-pay | Admitting: Emergency Medicine

## 2016-03-20 ENCOUNTER — Emergency Department (HOSPITAL_COMMUNITY): Payer: 59

## 2016-03-20 ENCOUNTER — Emergency Department (HOSPITAL_COMMUNITY)
Admission: EM | Admit: 2016-03-20 | Discharge: 2016-03-21 | Disposition: A | Discharge status: Home / Self Care | Payer: 59 | Attending: Emergency Medicine | Admitting: Emergency Medicine

## 2016-03-20 DIAGNOSIS — Z79899 Other long term (current) drug therapy: Secondary | ICD-10-CM | POA: Diagnosis not present

## 2016-03-20 DIAGNOSIS — I10 Essential (primary) hypertension: Secondary | ICD-10-CM | POA: Diagnosis not present

## 2016-03-20 DIAGNOSIS — Z87891 Personal history of nicotine dependence: Secondary | ICD-10-CM | POA: Diagnosis not present

## 2016-03-20 DIAGNOSIS — Z3202 Encounter for pregnancy test, result negative: Secondary | ICD-10-CM | POA: Insufficient documentation

## 2016-03-20 DIAGNOSIS — Z880 Allergy status to penicillin: Secondary | ICD-10-CM | POA: Insufficient documentation

## 2016-03-20 DIAGNOSIS — R079 Chest pain, unspecified: Secondary | ICD-10-CM | POA: Insufficient documentation

## 2016-03-20 DIAGNOSIS — E119 Type 2 diabetes mellitus without complications: Secondary | ICD-10-CM | POA: Diagnosis not present

## 2016-03-20 DIAGNOSIS — Z86018 Personal history of other benign neoplasm: Secondary | ICD-10-CM | POA: Insufficient documentation

## 2016-03-20 DIAGNOSIS — F419 Anxiety disorder, unspecified: Secondary | ICD-10-CM | POA: Insufficient documentation

## 2016-03-20 DIAGNOSIS — R000 Tachycardia, unspecified: Secondary | ICD-10-CM | POA: Insufficient documentation

## 2016-03-20 DIAGNOSIS — Z793 Long term (current) use of hormonal contraceptives: Secondary | ICD-10-CM | POA: Insufficient documentation

## 2016-03-20 DIAGNOSIS — R140 Abdominal distension (gaseous): Secondary | ICD-10-CM | POA: Insufficient documentation

## 2016-03-20 DIAGNOSIS — K219 Gastro-esophageal reflux disease without esophagitis: Secondary | ICD-10-CM | POA: Insufficient documentation

## 2016-03-20 LAB — COMPREHENSIVE METABOLIC PANEL
ALT: 12 U/L — ABNORMAL LOW (ref 14–54)
AST: 20 U/L (ref 15–41)
Albumin: 4.3 g/dL (ref 3.5–5.0)
Alkaline Phosphatase: 49 U/L (ref 38–126)
Anion gap: 8 (ref 5–15)
BUN: 7 mg/dL (ref 6–20)
CO2: 21 mmol/L — ABNORMAL LOW (ref 22–32)
Calcium: 8.6 mg/dL — ABNORMAL LOW (ref 8.9–10.3)
Chloride: 107 mmol/L (ref 101–111)
Creatinine, Ser: 1.04 mg/dL — ABNORMAL HIGH (ref 0.44–1.00)
GFR calc Af Amer: >60 mL/min (ref >60)
GFR calc non Af Amer: >60 mL/min (ref >60)
Glucose, Bld: 158 mg/dL — ABNORMAL HIGH (ref 65–99)
Potassium: 3.3 mmol/L — ABNORMAL LOW (ref 3.5–5.1)
Sodium: 136 mmol/L (ref 135–145)
Total Bilirubin: 0.7 mg/dL (ref 0.3–1.2)
Total Protein: 7.3 g/dL (ref 6.5–8.1)

## 2016-03-20 LAB — I-STAT TROPONIN, ED: Troponin i, poc: 0 ng/mL (ref 0.00–0.08)

## 2016-03-20 LAB — CBC
HCT: 36.8 % (ref 36.0–46.0)
Hemoglobin: 13 g/dL (ref 12.0–15.0)
MCH: 30.1 pg (ref 26.0–34.0)
MCHC: 35.3 g/dL (ref 30.0–36.0)
MCV: 85.2 fL (ref 78.0–100.0)
Platelets: 238 10*3/uL (ref 150–400)
RBC: 4.32 MIL/uL (ref 3.87–5.11)
RDW: 12.6 % (ref 11.5–15.5)
WBC: 9.4 10*3/uL (ref 4.0–10.5)

## 2016-03-20 LAB — I-STAT BETA HCG BLOOD, ED (MC, WL, AP ONLY): I-stat hCG, quantitative: <5 m[IU]/mL (ref <5)

## 2016-03-20 LAB — LIPASE, BLOOD: Lipase: 25 U/L (ref 11–51)

## 2016-03-20 LAB — D-DIMER, QUANTITATIVE (NOT AT ARMC): D-Dimer, Quant: <0.27 ug/mL-FEU (ref 0.00–0.50)

## 2016-03-20 MED ORDER — SODIUM CHLORIDE 0.9 % IV BOLUS (SEPSIS)
1000.0000 mL | Freq: Once | INTRAVENOUS | Status: AC
  Administered 2016-03-20: 1000 mL via INTRAVENOUS

## 2016-03-20 NOTE — ED Notes (Signed)
Pt from home with c/o central chest pain x 2 days that she describes as pressure. Pt is tachycardic. Pt took 0.5 ativan prior to arival.

## 2016-03-20 NOTE — ED Provider Notes (Signed)
CSN: [id number]     Arrival date & time 03/20/16  2044 History   First MD Initiated Contact with Patient 03/20/16 2059     Chief Complaint  Patient presents with  . Chest Pain     The history is provided by the patient. No language interpreter was used.   Kristl Morioka Felice-Jennings is a 40 y.o. female who presents to the Emergency Department complaining of chest pain.  She reports 2 days of left upper chest tightness and soreness. Today she developed a sharp left upper chest pain. She does have some associated sensation of difficulty breathing but no exact shortness of breath. She took an Ativan at home to see if this would change her symptoms and it has not. She has associated abdominal bloating and malaise for the last few days. No fever, cough. She does have occasional left calf cramping. She does not smoke. She takes birth control pills. She is followed by cardiology for tachycardia.  Past Medical History  Diagnosis Date  . Hypertension   . GERD (gastroesophageal reflux disease)   . Tachycardia   . Diabetes mellitus without complication ([place])   . Fibroid uterus   . Back pain   . Chest pain   . Anxiety    Past Surgical History  Procedure Laterality Date  . Wisdom tooth extraction     Family History  Problem Relation Age of Onset  . Seizures Mother   . Cancer Father     Liver  . Diabetes Father 2  . Hypertension Father   . Heart disease Father 39    CEA, LE [place]  . Alzheimer's disease Maternal Grandmother   . Heart disease Maternal Grandfather    Social History  Substance Use Topics  . Smoking status: Former Smoker -- 0.25 packs/day for 10 years    Types: Cigarettes    Quit date: 08/19/2014  . Smokeless tobacco: Never Used  . Alcohol Use: No   OB History    Gravida Para Term Preterm AB TAB SAB Ectopic Multiple Living   2 0 0 0 2 1 1 0 0 0      Review of Systems  All other systems reviewed and are negative.     Allergies  Penicillins; Tamiflu; and  Tinidazole  Home Medications   Prior to Admission medications   Medication Sig Start Date End Date Taking? Authorizing Provider  ibuprofen (ADVIL,MOTRIN) 200 MG tablet Take 200 mg by mouth every 6 (six) hours as needed for moderate pain.   Yes Historical Provider, MD  LORazepam (ATIVAN) 0.5 MG tablet Take 0.5 mg by mouth every 6 (six) hours as needed for anxiety.    Yes Historical Provider, MD  Norethindrone-Ethinyl Estradiol-Fe Biphas (LO LOESTRIN FE) 1 MG-10 MCG / 10 MCG tablet Take 1 tablet by mouth daily.   Yes Historical Provider, MD  ranitidine (ZANTAC) 150 MG tablet TAKE ONE TABLET BY MOUTH TWICE DAILY 03/18/16  Yes [name], MD  blood glucose meter kit and supplies Test blood sugar twice daily. Dx code: R73.09 07/22/15   [name], MD  ergocalciferol (VITAMIN D2) 50000 units capsule Take 1 capsule (50,000 Units total) by mouth once a week. 02/10/16   [name], MD  fluticasone (FLONASE) 50 MCG/ACT nasal spray Place 1 spray into both nostrils daily as needed for allergies.     Historical Provider, MD  glucose blood test strip Test blood sugar twice daily. Dx code: R73.09 07/22/15   [name], MD  HYDROcodone-acetaminophen (  NORCO/VICODIN) 5-325 MG tablet Take 1-2 tablets by mouth every 6 hours as needed for pain and/or cough. Patient not taking: Reported on 03/20/2016 01/25/16   Elmyra Ricks Pisciotta, PA-C  ONETOUCH DELICA LANCETS 63J MISC Test blood sugar twice daily. Dx code: R73.09 07/22/15   [name], MD  Propylene Glycol (SYSTANE BALANCE) 0.6 % SOLN Apply 1 drop to eye daily as needed (for dry eyes).     Historical Provider, MD   BP 119/71 mmHg  Pulse 86  Temp(Src) 98.1 F (36.7 C) (Oral)  Resp 18  Ht 5' 3"  (1.6 m)  Wt 160 lb (72.576 kg)  BMI 28.35 kg/m2  SpO2 99%  LMP 02/21/2016 Physical Exam  Constitutional: She is oriented to person, place, and time. She appears well-developed and well-nourished.  HENT:  Head: Normocephalic and atraumatic.  Cardiovascular: Regular rhythm.    No murmur heard. Tachycardic  Pulmonary/Chest: Effort normal and breath sounds normal. No respiratory distress.  Abdominal: Soft. There is no tenderness. There is no rebound and no guarding.  Musculoskeletal: She exhibits no edema or tenderness.  Neurological: She is alert and oriented to person, place, and time.  Skin: Skin is warm and dry.  Psychiatric: She has a normal mood and affect. Her behavior is normal.  Nursing note and vitals reviewed.   ED Course  Procedures (including critical care time) Labs Review Labs Reviewed  COMPREHENSIVE METABOLIC PANEL - Abnormal; Notable for the following:    Potassium 3.3 (*)    CO2 21 (*)    Glucose, Bld 158 (*)    Creatinine, Ser 1.04 (*)    Calcium 8.6 (*)    ALT 12 (*)    All other components within normal limits  CBC  LIPASE, BLOOD  D-DIMER, QUANTITATIVE (NOT AT [facility])  I-STAT TROPOININ, ED  I-STAT BETA HCG BLOOD, ED ([facility], [facility], [facility] ONLY)  I-STAT TROPOININ, ED    Imaging Review Dg Chest 2 View  03/20/2016  CLINICAL DATA:  Chest pressure for 3 days. Left-sided chest pressure starting last night. Tachycardia. Former smoker. EXAM: CHEST  2 VIEW COMPARISON:  01/25/2016 FINDINGS: The heart size and mediastinal contours are within normal limits. Both lungs are clear. The visualized skeletal structures are unremarkable. IMPRESSION: No active cardiopulmonary disease. Electronically Signed   By: Lucienne Capers M.D.   On: 03/20/2016 22:36   I have personally reviewed and evaluated these images and lab results as part of my medical decision-making.   EKG Interpretation   Date/Time:  Sunday Mar 20 2016 20:49:49 EDT Ventricular Rate:  150 PR Interval:  126 QRS Duration: 102 QT Interval:  277 QTC Calculation: 437 R Axis:   61 Text Interpretation:  Sinus tachycardia Low voltage, precordial leads  Confirmed by Hazle Coca [phone]) on 03/20/2016 9:04:17 PM      MDM   Final diagnoses:  Chest pain, unspecified chest pain type  Sinus  tachycardia [facility])    Patient here for evaluation of left-sided chest pain. She's had 2 days of mild pain and now sharp pain today. She was noted to be significantly tachycardic on ED triage. Her heart rate improved prior to any intervention down to sinus rhythm in the 80s. Presentation is not consistent with ACS, PE, dissection. Discussed close cardiology follow-up for further evaluation. Return precautions were discussed.    Quintella Reichert, MD 03/21/16 [phone]

## 2016-03-21 ENCOUNTER — Telehealth: Payer: Self-pay | Admitting: Cardiology

## 2016-03-21 LAB — I-STAT TROPONIN, ED: Troponin i, poc: 0 ng/mL (ref 0.00–0.08)

## 2016-03-21 NOTE — Telephone Encounter (Signed)
No answer. Left message to call back.   

## 2016-03-21 NOTE — Discharge Instructions (Signed)
Nonspecific Chest Pain  Chest pain can be caused by many different conditions. There is always a chance that your pain could be related to something serious, such as a heart attack or a blood clot in your lungs. Chest pain can also be caused by conditions that are not life-threatening. If you have chest pain, it is very important to follow up with your health care provider. CAUSES  Chest pain can be caused by:  Heartburn.  Pneumonia or bronchitis.  Anxiety or stress.  Inflammation around your heart (pericarditis) or lung (pleuritis or pleurisy).  A blood clot in your lung.  A collapsed lung (pneumothorax). It can develop suddenly on its own (spontaneous pneumothorax) or from trauma to the chest.  Shingles infection (varicella-zoster virus).  Heart attack.  Damage to the bones, muscles, and cartilage that make up your chest wall. This can include:  Bruised bones due to injury.  Strained muscles or cartilage due to frequent or repeated coughing or overwork.  Fracture to one or more ribs.  Sore cartilage due to inflammation (costochondritis). RISK FACTORS  Risk factors for chest pain may include:  Activities that increase your risk for trauma or injury to your chest.  Respiratory infections or conditions that cause frequent coughing.  Medical conditions or overeating that can cause heartburn.  Heart disease or family history of heart disease.  Conditions or health behaviors that increase your risk of developing a blood clot.  Having had chicken pox (varicella zoster). SIGNS AND SYMPTOMS Chest pain can feel like:  Burning or tingling on the surface of your chest or deep in your chest.  Crushing, pressure, aching, or squeezing pain.  Dull or sharp pain that is worse when you move, cough, or take a deep breath.  Pain that is also felt in your back, neck, shoulder, or arm, or pain that spreads to any of these areas. Your chest pain may come and go, or it may stay  constant. DIAGNOSIS Lab tests or other studies may be needed to find the cause of your pain. Your health care provider may have you take a test called an ambulatory ECG (electrocardiogram). An ECG records your heartbeat patterns at the time the test is performed. You may also have other tests, such as:  Transthoracic echocardiogram (TTE). During echocardiography, sound waves are used to create a picture of all of the heart structures and to look at how blood flows through your heart.  Transesophageal echocardiogram (TEE).This is a more advanced imaging test that obtains images from inside your body. It allows your health care provider to see your heart in finer detail.  Cardiac monitoring. This allows your health care provider to monitor your heart rate and rhythm in real time.  Holter monitor. This is a portable device that records your heartbeat and can help to diagnose abnormal heartbeats. It allows your health care provider to track your heart activity for several days, if needed.  Stress tests. These can be done through exercise or by taking medicine that makes your heart beat more quickly.  Blood tests.  Imaging tests. TREATMENT  Your treatment depends on what is causing your chest pain. Treatment may include:  Medicines. These may include:  Acid blockers for heartburn.  Anti-inflammatory medicine.  Pain medicine for inflammatory conditions.  Antibiotic medicine, if an infection is present.  Medicines to dissolve blood clots.  Medicines to treat coronary artery disease.  Supportive care for conditions that do not require medicines. This may include:  Resting.  Applying heat  or cold packs to injured areas.  Limiting activities until pain decreases. HOME CARE INSTRUCTIONS  If you were prescribed an antibiotic medicine, finish it all even if you start to feel better.  Avoid any activities that bring on chest pain.  Do not use any tobacco products, including  cigarettes, chewing tobacco, or electronic cigarettes. If you need help quitting, ask your health care provider.  Do not drink alcohol.  Take medicines only as directed by your health care provider.  Keep all follow-up visits as directed by your health care provider. This is important. This includes any further testing if your chest pain does not go away.  If heartburn is the cause for your chest pain, you may be told to keep your head raised (elevated) while sleeping. This reduces the chance that acid will go from your stomach into your esophagus.  Make lifestyle changes as directed by your health care provider. These may include:  Getting regular exercise. Ask your health care provider to suggest some activities that are safe for you.  Eating a heart-healthy diet. A registered dietitian can help you to learn healthy eating options.  Maintaining a healthy weight.  Managing diabetes, if necessary.  Reducing stress. SEEK MEDICAL CARE IF:  Your chest pain does not go away after treatment.  You have a rash with blisters on your chest.  You have a fever. SEEK IMMEDIATE MEDICAL CARE IF:   Your chest pain is worse.  You have an increasing cough, or you cough up blood.  You have severe abdominal pain.  You have severe weakness.  You faint.  You have chills.  You have sudden, unexplained chest discomfort.  You have sudden, unexplained discomfort in your arms, back, neck, or jaw.  You have shortness of breath at any time.  You suddenly start to sweat, or your skin gets clammy.  You feel nauseous or you vomit.  You suddenly feel light-headed or dizzy.  Your heart begins to beat quickly, or it feels like it is skipping beats. These symptoms may represent a serious problem that is an emergency. Do not wait to see if the symptoms will go away. Get medical help right away. Call your local emergency services (911 in the [place] Do not drive yourself to the hospital.   This  information is not intended to replace advice given to you by your health care provider. Make sure you discuss any questions you have with your health care provider.   Document Released: 08/03/2005 Document Revised: 11/14/2014 Document Reviewed: 05/30/2014 Elsevier Interactive Patient Education 2016 [employer]  Nonspecific Tachycardia Tachycardia is a faster than normal heartbeat (more than 100 beats per minute). In adults, the heart normally beats between 60 and 100 times a minute. A fast heartbeat may be a normal response to exercise or stress. It does not necessarily mean that something is wrong. However, sometimes when your heart beats too fast it may not be able to pump enough blood to the rest of your body. This can result in chest pain, shortness of breath, dizziness, and even fainting. Nonspecific tachycardia means that the specific cause or pattern of your tachycardia is unknown. CAUSES  Tachycardia may be harmless or it may be due to a more serious underlying cause. Possible causes of tachycardia include:  Exercise or exertion.  Fever.  Pain or injury.  Infection.  Loss of body fluids (dehydration).  Overactive thyroid.  Lack of red blood cells (anemia).  Anxiety and stress.  Alcohol.  Caffeine.  Tobacco  products.  Diet pills.  Illegal drugs.  Heart disease. SYMPTOMS  Rapid or irregular heartbeat (palpitations).  Suddenly feeling your heart beating (cardiac awareness).  Dizziness.  Tiredness (fatigue).  Shortness of breath.  Chest pain.  Nausea.  Fainting. DIAGNOSIS  Your caregiver will perform a physical exam and take your medical history. In some cases, a heart specialist (cardiologist) may be consulted. Your caregiver may also order:  Blood tests.  Electrocardiography. This test records the electrical activity of your heart.  A heart monitoring test. TREATMENT  Treatment will depend on the likely cause of your tachycardia. The goal is to  treat the underlying cause of your tachycardia. Treatment methods may include:  Replacement of fluids or blood through an intravenous (IV) tube for moderate to severe dehydration or anemia.  New medicines or changes in your current medicines.  Diet and lifestyle changes.  Treatment for certain infections.  Stress relief or relaxation methods. HOME CARE INSTRUCTIONS   Rest.  Drink enough fluids to keep your urine clear or pale yellow.  Do not smoke.  Avoid:  Caffeine.  Tobacco.  Alcohol.  Chocolate.  Stimulants such as over-the-counter diet pills or pills that help you stay awake.  Situations that cause anxiety or stress.  Illegal drugs such as marijuana, phencyclidine (PCP), and cocaine.  Only take medicine as directed by your caregiver.  Keep all follow-up appointments as directed by your caregiver. SEEK IMMEDIATE MEDICAL CARE IF:   You have pain in your chest, upper arms, jaw, or neck.  You become weak, dizzy, or feel faint.  You have palpitations that will not go away.  You vomit, have diarrhea, or pass blood in your stool.  Your skin is cool, pale, and wet.  You have a fever that will not go away with rest, fluids, and medicine. MAKE SURE YOU:   Understand these instructions.  Will watch your condition.  Will get help right away if you are not doing well or get worse.   This information is not intended to replace advice given to you by your health care provider. Make sure you discuss any questions you have with your health care provider.   Document Released: 12/01/2004 Document Revised: 01/16/2012 Document Reviewed: 05/08/2015 Elsevier Interactive Patient Education [employer].

## 2016-03-21 NOTE — ED Notes (Signed)
Blood specimen drawn from [facility].  10 mL drawn & discarded prior to draw for Troponin.

## 2016-03-21 NOTE — Telephone Encounter (Signed)
New Message  Pt requested to speak w/ RN concerning her upcoming appt; Please call back and discuss.

## 2016-03-21 NOTE — Telephone Encounter (Signed)
Pt calling back. She went to [facility] ED on 03/20/16 for chest pain. Troponin was neg in ED.  They asked to have her follow up with cardiologist. She was calling to get an earlier appt. She says BP today 143/85, HR 95. Appt rescheduled with Ignacia Bayley for 03/31/16. Pt aware of appt.  Will route to Dr Percival Spanish.

## 2016-03-31 ENCOUNTER — Encounter: Payer: Self-pay | Admitting: Nurse Practitioner

## 2016-03-31 ENCOUNTER — Ambulatory Visit (INDEPENDENT_AMBULATORY_CARE_PROVIDER_SITE_OTHER): Payer: 59 | Admitting: Nurse Practitioner

## 2016-03-31 VITALS — BP 138/72 | HR 93 | Ht 64.0 in | Wt 157.2 lb

## 2016-03-31 DIAGNOSIS — R002 Palpitations: Secondary | ICD-10-CM | POA: Diagnosis not present

## 2016-03-31 DIAGNOSIS — I10 Essential (primary) hypertension: Secondary | ICD-10-CM

## 2016-03-31 DIAGNOSIS — F419 Anxiety disorder, unspecified: Secondary | ICD-10-CM

## 2016-03-31 DIAGNOSIS — R0789 Other chest pain: Secondary | ICD-10-CM

## 2016-03-31 MED ORDER — DILTIAZEM HCL ER COATED BEADS 120 MG PO CP24: 120.0000 mg | ORAL_CAPSULE | Freq: Every day | ORAL | Status: DC

## 2016-03-31 NOTE — Patient Instructions (Signed)
Brooke Bayley, NP, has recommended making the following medication changes: 1. START Diltiazem 120 mg - take 1 tablet by mouth once daily  Please keep your previous scheduled appointment with Dr Percival Spanish.  If you need a refill on your cardiac medications before your next appointment, please call your pharmacy.

## 2016-03-31 NOTE — Progress Notes (Signed)
Office Visit    Patient Name: Brooke Turner Date of Encounter: 03/31/2016  Primary Care Provider:  [name], MD Primary Cardiologist:  J. Hochrein, MD   Chief Complaint    40 y/o ? with a h/o chest pain, palpitations, HTN, anxiety, and prediabetes, who presents for f/u related to HTN.   Past Medical History    Past Medical History  Diagnosis Date  . Essential hypertension     a. 01/2016 Renal duplex: no RAS- ? renal cyst (renal u/s 4/17 - no cyst, nl study); b. 02/2016 24 hr BP Monitor: Mean SBP ~ 138 with Max of 198. Highest pressures noted in evening hours following placement of cuff.  Marland Kitchen GERD (gastroesophageal reflux disease)   . Palpitations     a. 09/2014 nl event monitor.  . Diabetes mellitus without complication ([place])   . Fibroid uterus   . Back pain   . Chest pain     a. 09/2014 Echo: EF 60-65%, no rwma, nl valves;  b. 12/2014 Neg ETT.  Marland Kitchen Anxiety    Past Surgical History  Procedure Laterality Date  . Wisdom tooth extraction      Allergies  Allergies  Allergen Reactions  . Penicillins Hives    Has patient had a PCN reaction causing immediate rash, facial/tongue/throat swelling, SOB or lightheadedness with hypotension:Yes Has patient had a PCN reaction causing severe rash involving mucus membranes or skin necrosis:unsure Has patient had a PCN reaction that required hospitalization: Yes Has patient had a PCN reaction occurring within the last 10 years: No If all of the above answers are "NO", then may proceed with Cephalosporin use.     . Atenolol     Fatigue   . Coreg [Carvedilol]     Fatigue   . Tamiflu [Oseltamivir Phosphate] Nausea And Vomiting    Excessive vomiting  . Tinidazole     Caused severe abd pain/type of colitis     History of Present Illness    40 y/o ? with the above complex past medical history.She has a somewhat long history of atypical chest discomfort with normal treadmill testing in February of 2016. She also has a  history of anxiety with associated palpitations and marked and blood pressure fluctuations. She has been on beta blocker therapy in the past, which she says was effective in lowering her blood pressure and limiting her palpitations, however she did not ultimately tolerate secondary to fatigue. She had previously tolerated lisinopril, but did not think that it worked. She has also previously been prescribed diltiazem to be taken when necessary for palpitations, but says she has never actually taken this.  Following her last visit, she was set up for 24-hour ambulatory blood pressure monitoring, which showed marked hypertension with systolics into the 782U within a few hours of cuff being placed-in the early evening hours. Blood pressure settled out over the course of the night and then trended in the low 100s to 150s throughout the following morning and afternoon. Mean heart rates were between 68 and 74. Patient also underwent renal arterial duplex which was normal but there is question of a renal cyst. A follow-up dedicated renal ultrasound is performed and was negative for cyst. Since her last visit, she has been seen twice in the emergency department. The first was on April 7 for lightheadedness and anxiety with a negative workup. The second was on May 14 for left-sided chest pain that was sharp in nature. Workup was again negative and she was discharged  from the ED. She says that she has continued to have intermittent palpitations and tachycardia, most notable first thing in the morning. This is most noticeable while showering and as a result, the active showering has created a lot of anxiety and it often takes her an extended amount of time before she "convinces" herself to get into the shower and get it over with. In the setting of elevated heart rates while showering, she often check her blood pressure and noted that it is high. Further, she feels lightheaded and sometimes a sharp chest pain. All the  symptoms are similar to what has occurred in the past.  In the office today, she was initially told that her blood pressure was 138/72. After the medical assistant left the room, she got out her own cuff and checked it and found it to be 172/112. Upon my entry, she was quite anxious and we repeated her blood pressure with her cuff and then I checked a manual pressure. Her pressure was in fact in the 170s. This made her anxious, and she took an additional half of an Ativan while here in the office.  Home Medications    Prior to Admission medications   Medication Sig Start Date End Date Taking? Authorizing Provider  blood glucose meter kit and supplies Test blood sugar twice daily. Dx code: R73.09 07/22/15  Yes [name], MD  ergocalciferol (VITAMIN D2) 50000 units capsule Take 1 capsule (50,000 Units total) by mouth once a week. 02/10/16  Yes [name], MD  fluticasone (FLONASE) 50 MCG/ACT nasal spray Place 1 spray into both nostrils daily as needed for allergies.    Yes Historical Provider, MD  glucose blood test strip Test blood sugar twice daily. Dx code: R73.09 07/22/15  Yes [name], MD  ibuprofen (ADVIL,MOTRIN) 200 MG tablet Take 200 mg by mouth every 6 (six) hours as needed for moderate pain.   Yes Historical Provider, MD  LORazepam (ATIVAN) 0.5 MG tablet Take 0.5 mg by mouth every 6 (six) hours as needed for anxiety.    Yes Historical Provider, MD  Norethindrone-Ethinyl Estradiol-Fe Biphas (LO LOESTRIN FE) 1 MG-10 MCG / 10 MCG tablet Take 1 tablet by mouth daily.   Yes Historical Provider, MD  [facility] DELICA LANCETS 64P MISC Test blood sugar twice daily. Dx code: R73.09 07/22/15  Yes [name], MD  Propylene Glycol (SYSTANE BALANCE) 0.6 % SOLN Apply 1 drop to eye daily as needed (for dry eyes).    Yes Historical Provider, MD  ranitidine (ZANTAC) 150 MG tablet TAKE ONE TABLET BY MOUTH TWICE DAILY 03/18/16  Yes [name], MD  diltiazem (CARDIZEM CD) 120 MG 24 hr capsule Take 1 capsule (120 mg  total) by mouth daily. 03/31/16   Rogelia Mire, NP    Review of Systems    As above, she needs to have anxiety associated with tachycardia, palpitations, atypical chest pain, intermittent lightheadedness, and elevated blood pressures.  She denies PND, orthopnea, dizziness, syncope, edema, or early satiety. She has not been experiencing any significant dyspnea on exertion.  All other systems reviewed and are otherwise negative except as noted above.  Physical Exam    VS:  BP 138/72 mmHg  Pulse 93  Ht 5' 4"  (1.626 m)  Wt 157 lb 3.2 oz (71.305 kg)  BMI 26.97 kg/m2  LMP 02/21/2016 , BMI Body mass index is 26.97 kg/(m^2). GEN: Well nourished, well developed, in no acute distress. HEENT: normal. Neck: Supple, no JVD,  carotid bruits, or masses. Cardiac: RRR, no murmurs, rubs, or gallops. No clubbing, cyanosis, edema.  Radials/DP/PT 2+ and equal bilaterally.  Respiratory:  Respirations regular and unlabored, clear to auscultation bilaterally. GI: Soft, nontender, nondistended, BS + x 4. MS: no deformity or atrophy. Skin: warm and dry, no rash. Neuro:  Strength and sensation are intact. Psych: Normal affect.  Accessory Clinical Findings    ECG - Regular sinus rhythm, 93, no acute ST or T changes.  24-hour ambulatory blood pressure monitoring reviewed -highlights outlined above.  Assessment & Plan    1. Essential hypertension: Patient recently wore a 24-hour ambulatory blood pressure cuff revealing marketed hypertension within the first 6 hours of having it placed with pressures into the 190s. This was the early evening hours. Overnight and in the subsequent morning, blood pressure settled out. Overall, she had a mean blood pressure of about 138 with heart rates in the low 70s. We discussed options for management of her blood pressure. She noted that she feels as though Ativan does help her heart rate and blood pressure and helps to settle her mood and is going to seek a psychiatric  care to see if improvement medical therapy from that and can help with her other symptoms. In the setting of high blood pressures and also a history of palpitations and sinus tachycardia, she has agreed to accept a prescription for diltiazem CD 120 mg daily.  She does have some reservations about initiating medication for her blood pressure and we discussed the indications and side effects of diltiazem at length. She will check her blood pressure once in the morning and once in the evening on a daily basis and has follow-up with Dr. Percival Spanish in 2 weeks at which time his data can be evaluated and diltiazem therapy can be adjusted if necessary.  2. Sinus tachycardia/palpitations: Patient notes tachycardia during periods of extreme anxiety, which often occur first thing in the morning while showering or thinking about showering. As above, she does plan to follow up with psychiatry and has noted that taking lorazepam helps her symptoms. I am hopeful that diltiazem will help to settle out her heart rate issue some though clearly the underlying cause, which appears to be anxiety, will need to be addressed.   3. Anxiety: Currently on when necessary lorazepam. This is been managed by her primary care provider and she is looking into following up with psychiatry for advanced therapy.  4. Atypical chest pain: She continues to have intermittent sharp shooting chest pain in the setting of palpitations and anxiety. She's had normal exercise treadmill testing in the past. We did discuss the role of adding an imaging modality to stress testing as she remains anxious about her chest pain and questions whether or not the initial study was accurate. We discussed stress echocardiography as potential role in a young female whom we would prefer to avoid exposing to radiation, however she has a fair amount of anxiety about getting on the treadmill again. She would like to defer any additional testing for chest pain at this time. I  offered reassurance given prior history of normal treadmill, normal echo, and negative cardiac markers when she has been in the ER with chest pain in the past.  5. Disposition: 60 minutes was spent in meeting with an managing this patient.  She will follow-up with Dr. Percival Spanish as scheduled in 2 weeks.  Murray Hodgkins, NP 03/31/2016, 9:27 AM

## 2016-04-01 ENCOUNTER — Ambulatory Visit: Payer: 59 | Admitting: Cardiology

## 2016-04-06 ENCOUNTER — Telehealth: Payer: Self-pay | Admitting: Cardiology

## 2016-04-06 NOTE — Telephone Encounter (Signed)
Pt notes her tachycardia episodes seem to be more frequent in hot weather. She has a longstanding history of palpitations and tachycardia. We discussed BP changes/vasodilation due to hot weather, showers, etc, which may be contributing factor in her tachycardia episodes. Advised caution w/ new BP med - she has not started yet - avoid hot showers, excessive outdoor heat, etc. Pt encouraged to discuss this w/ Dr. Percival Spanish at her appt next week. She will call if sooner, urgent concerns.

## 2016-04-06 NOTE — Telephone Encounter (Signed)
Brooke Turner is calling because she has a question about her Tachycardia. She is seeming to have it when she is exposed to heat and trying to figure out why that could be happening?  Thanks

## 2016-04-08 ENCOUNTER — Ambulatory Visit (INDEPENDENT_AMBULATORY_CARE_PROVIDER_SITE_OTHER): Payer: 59 | Admitting: Family Medicine

## 2016-04-08 ENCOUNTER — Other Ambulatory Visit: Payer: Self-pay | Admitting: Family Medicine

## 2016-04-08 VITALS — BP 184/110 | HR 130 | Temp 98.5°F | Resp 18 | Ht 64.0 in | Wt 156.0 lb

## 2016-04-08 DIAGNOSIS — R030 Elevated blood-pressure reading, without diagnosis of hypertension: Secondary | ICD-10-CM | POA: Diagnosis not present

## 2016-04-08 DIAGNOSIS — R000 Tachycardia, unspecified: Secondary | ICD-10-CM

## 2016-04-08 DIAGNOSIS — E559 Vitamin D deficiency, unspecified: Secondary | ICD-10-CM | POA: Diagnosis not present

## 2016-04-08 DIAGNOSIS — IMO0001 Reserved for inherently not codable concepts without codable children: Secondary | ICD-10-CM

## 2016-04-08 DIAGNOSIS — R319 Hematuria, unspecified: Secondary | ICD-10-CM

## 2016-04-08 DIAGNOSIS — N920 Excessive and frequent menstruation with regular cycle: Secondary | ICD-10-CM | POA: Diagnosis not present

## 2016-04-08 DIAGNOSIS — N2581 Secondary hyperparathyroidism of renal origin: Secondary | ICD-10-CM

## 2016-04-08 LAB — POCT URINALYSIS DIP (MANUAL ENTRY)
Bilirubin, UA: NEGATIVE
Glucose, UA: NEGATIVE
Nitrite, UA: NEGATIVE
Protein Ur, POC: NEGATIVE
Spec Grav, UA: 1.01
Urobilinogen, UA: 0.2
pH, UA: 6

## 2016-04-08 LAB — POC MICROSCOPIC URINALYSIS (UMFC): Mucus: ABSENT

## 2016-04-08 MED ORDER — ALPRAZOLAM 0.25 MG PO TBDP: 0.2500 mg | ORAL_TABLET | Freq: Two times a day (BID) | ORAL | Status: DC | PRN

## 2016-04-08 MED ORDER — LORAZEPAM 0.5 MG PO TABS: 0.5000 mg | ORAL_TABLET | Freq: Four times a day (QID) | ORAL | Status: DC | PRN

## 2016-04-08 NOTE — Patient Instructions (Addendum)
IF you received an x-ray today, you will receive an invoice from [facility]. Please contact [facility] at [phone] with questions or concerns regarding your invoice.   IF you received labwork today, you will receive an invoice from [employer]. Please contact [facility] at [phone] with questions or concerns regarding your invoice.   Our billing staff will not be able to assist you with questions regarding bills from these companies.  You will be contacted with the lab results as soon as they are available. The fastest way to get your results is to activate your My Chart account. Instructions are located on the last page of this paperwork. If you have not heard from [place] regarding the results in 2 weeks, please contact this office.    We recommend that you schedule a mammogram for breast cancer screening. Typically, you do not need a referral to do this. Please contact a local imaging center to schedule your mammogram.  [facility] - [phone]  *ask for the Radiology Department [facility] ([place]) - [phone] or [phone]  [facility] - [phone] [place] [phone] [facility] - [phone]  *ask for the [facility] - [phone]  *ask for the Radiology Department [facility] - [phone]  *ask for the [place] - [phone]  Managing Your High Blood Pressure Blood pressure is a measurement of how forceful your blood is pressing against the walls of the arteries. Arteries are muscular tubes within the circulatory system. Blood pressure does not stay the same. Blood pressure rises when you are active, excited, or nervous; and it lowers during sleep and relaxation. If the numbers measuring your blood pressure stay above normal most of the time, you are at risk for  health problems. High blood pressure (hypertension) is a long-term (chronic) condition in which blood pressure is elevated. A blood pressure reading is recorded as two numbers, such as 120 over 80 (or 120/80). The first, higher number is called the systolic pressure. It is a measure of the pressure in your arteries as the heart beats. The second, lower number is called the diastolic pressure. It is a measure of the pressure in your arteries as the heart relaxes between beats.  Keeping your blood pressure in a normal range is important to your overall health and prevention of health problems, such as heart disease and stroke. When your blood pressure is uncontrolled, your heart has to work harder than normal. High blood pressure is a very common condition in adults because blood pressure tends to rise with age. Men and women are equally likely to have hypertension but at different times in life. Before age 63, men are more likely to have hypertension. After 41 years of age, women are more likely to have it. Hypertension is especially common in African Americans. This condition often has no signs or symptoms. The cause of the condition is usually not known. Your caregiver can help you come up with a plan to keep your blood pressure in a normal, healthy range. BLOOD PRESSURE STAGES Blood pressure is classified into four stages: normal, prehypertension, stage 1, and stage 2. Your blood pressure reading will be used to determine what type of treatment, if any, is necessary. Appropriate treatment options are tied to these four stages:  Normal  Systolic pressure (mm Hg): below 120.  Diastolic pressure (mm Hg): below 80.  Prehypertension  Systolic pressure (mm Hg): 120 to 139.  Diastolic pressure (mm Hg): 80 to 89. Stage1  Systolic pressure (mm Hg): 140 to 159.  Diastolic pressure (mm Hg): 90 to 99. Stage2  Systolic pressure (mm Hg): 160 or above.  Diastolic pressure (mm Hg): 100 or above. RISKS  RELATED TO HIGH BLOOD PRESSURE Managing your blood pressure is an important responsibility. Uncontrolled high blood pressure can lead to:  A heart attack.  A stroke.  A weakened blood vessel (aneurysm).  Heart failure.  Kidney damage.  Eye damage.  Metabolic syndrome.  Memory and concentration problems. HOW TO MANAGE YOUR BLOOD PRESSURE Blood pressure can be managed effectively with lifestyle changes and medicines (if needed). Your caregiver will help you come up with a plan to bring your blood pressure within a normal range. Your plan should include the following: Education  Read all information provided by your caregivers about how to control blood pressure.  Educate yourself on the latest guidelines and treatment recommendations. New research is always being done to further define the risks and treatments for high blood pressure. Lifestylechanges  Control your weight.  Avoid smoking.  Stay physically active.  Reduce the amount of salt in your diet.  Reduce stress.  Control any chronic conditions, such as high cholesterol or diabetes.  Reduce your alcohol intake. Medicines  Several medicines (antihypertensive medicines) are available, if needed, to bring blood pressure within a normal range. Communication  Review all the medicines you take with your caregiver because there may be side effects or interactions.  Talk with your caregiver about your diet, exercise habits, and other lifestyle factors that may be contributing to high blood pressure.  See your caregiver regularly. Your caregiver can help you create and adjust your plan for managing high blood pressure. RECOMMENDATIONS FOR TREATMENT AND FOLLOW-UP  The following recommendations are based on current guidelines for managing high blood pressure in nonpregnant adults. Use these recommendations to identify the proper follow-up period or treatment option based on your blood pressure reading. You can discuss  these options with your caregiver.  Systolic pressure of 123456 to [id number] or diastolic pressure of 80 to 89: Follow up with your caregiver as directed.  Systolic pressure of [id number] to 0000000 or diastolic pressure of 90 to 100: Follow up with your caregiver within 2 months.  Systolic pressure above 0000000 or diastolic pressure above [id number]: Follow up with your caregiver within 1 month.  Systolic pressure above [id number] or diastolic pressure above [id number]: Consider antihypertensive therapy; follow up with your caregiver within 1 week.  Systolic pressure above [id number] or diastolic pressure above 123456: Begin antihypertensive therapy; follow up with your caregiver within 1 week.   This information is not intended to replace advice given to you by your health care provider. Make sure you discuss any questions you have with your health care provider.   Document Released: 07/18/2012 Document Reviewed: 07/18/2012 Elsevier Interactive Patient Education [employer].

## 2016-04-08 NOTE — Progress Notes (Addendum)
By signing my name below I, Tereasa Coop, attest that this documentation has been prepared under the direction and in the presence of [name], MD. Electonically Signed. Tereasa Coop, Scribe 04/08/2016 at 7:25 PM  Subjective:    Patient ID: Brooke Turner, female    DOB: 1976/01/24, 40 y.o.   MRN: [id number]  Chief Complaint  Patient presents with  . Tachycardia  . Hypertension    HPI Brooke Turner is a 40 y.o. female who presents to the [facility] complaining of tachycardia and HTN.  Pt's tachycardia has been well evaluated by numerous specialists in [place] and [place]. Pt believes that her tachycardic episodes started when her alcoholic drink was spiked with cocaine and she experienced CP and was evaluated with a HR in the 200s by EMS.   Pt states that she was going to the gym regularly until about a month ago when she started using lorazepam more regularly than ever before. Pt states she felt sudden onset of rising BP yesterday while she was at work. She had sudden onset of head pressure, HA, tachycardia, heart palpitations and jaw tightness. Pt measured her pulse at 120 during episode.   Pt had a similar episode of HA, heart palpitations, and tachycardia this after noon and took her lorazepam 3 hrs ago at 1530 hrs and it did not resolve her symptoms. Pt also measured her BP at 170/90 during episode.    Pt reports waking up most mornings with tachycardia.   Pt feels mildly light headed now. Pt's tachycardia has resolved since being triaged and sitting alone in the room before start of exam.   Pt reports that she has been staying socially active and has been eating and drinking fluids regularly.   Pt is seeing her gynecologist in 5 days because her last menstrual period was much heavier than usual and lasted twice as long as it usually does. Period ended a week ago.   Pt had an appointment with cardiologist on 03/31/16 who prescribed pt small dose of  diltiazem.   Pt is taking her prescribed once weekly high dose vitamin D supplement. Pt has not yet started her iron supplement.    Patient Active Problem List   Diagnosis Date Noted  . Palpitations   . Chest pain   . Left sided numbness 06/11/2015  . Chronic tension-type headache, not intractable 06/10/2015  . Dyspnea 03/18/2015  . Post-traumatic stress reaction 02/25/2015  . Anxiety disorder due to general medical condition with panic attack 02/25/2015  . Subserous leiomyoma of uterus 02/25/2015  . Anxiety about health 02/25/2015  . Deviated nasal septum 12/30/2014  . Chronic rhinitis 12/30/2014  . Chronic maxillary sinusitis 12/30/2014  . Snorings 09/30/2014  . Fatigue 09/26/2014  . Dizziness and giddiness 09/11/2014  . Tachycardia 09/11/2014  . Precordial chest pain 09/11/2014  . Prediabetes 08/26/2014  . Acanthosis nigricans 03/19/2014  . Vitamin D deficiency 07/15/2013  . Large breasts 09/12/2012  . Essential hypertension 02/17/2012  . Contraception management 02/17/2012  . Obesity (BMI 30-39.9) previous BMI was over 40. Patient has lost 70 pounds in the last year 02/17/2012  . Anxiety disorder 02/17/2012  . Chronic back pain 02/17/2012    Current Outpatient Prescriptions on File Prior to Visit  Medication Sig Dispense Refill  . blood glucose meter kit and supplies Test blood sugar twice daily. Dx code: R73.09 1 each 0  . ergocalciferol (VITAMIN D2) 50000 units capsule Take 1 capsule (50,000 Units total) by mouth  once a week. 12 capsule 1  . fluticasone (FLONASE) 50 MCG/ACT nasal spray Place 1 spray into both nostrils daily as needed for allergies.     Marland Kitchen glucose blood test strip Test blood sugar twice daily. Dx code: R73.09 200 each 3  . ibuprofen (ADVIL,MOTRIN) 200 MG tablet Take 200 mg by mouth every 6 (six) hours as needed for moderate pain.    Marland Kitchen LORazepam (ATIVAN) 0.5 MG tablet Take 0.5 mg by mouth every 6 (six) hours as needed for anxiety.     .  Norethindrone-Ethinyl Estradiol-Fe Biphas (LO LOESTRIN FE) 1 MG-10 MCG / 10 MCG tablet Take 1 tablet by mouth daily.    [name] DELICA LANCETS 78L MISC Test blood sugar twice daily. Dx code: R73.09 200 each 3  . Propylene Glycol (SYSTANE BALANCE) 0.6 % SOLN Apply 1 drop to eye daily as needed (for dry eyes).     . ranitidine (ZANTAC) 150 MG tablet TAKE ONE TABLET BY MOUTH TWICE DAILY 30 tablet 2  . diltiazem (CARDIZEM CD) 120 MG 24 hr capsule Take 1 capsule (120 mg total) by mouth daily. (Patient not taking: Reported on 04/08/2016) 30 capsule 11   No current facility-administered medications on file prior to visit.    Allergies  Allergen Reactions  . Penicillins Hives    Has patient had a PCN reaction causing immediate rash, facial/tongue/throat swelling, SOB or lightheadedness with hypotension:Yes Has patient had a PCN reaction causing severe rash involving mucus membranes or skin necrosis:unsure Has patient had a PCN reaction that required hospitalization: Yes Has patient had a PCN reaction occurring within the last 10 years: No If all of the above answers are "NO", then may proceed with Cephalosporin use.     . Atenolol     Fatigue   . Coreg [Carvedilol]     Fatigue   . Tamiflu [Oseltamivir Phosphate] Nausea And Vomiting    Excessive vomiting  . Tinidazole     Caused severe abd pain/type of colitis     Depression screen [facility] 2/9 04/08/2016 02/07/2016 01/24/2016 11/22/2015 09/14/2015  Decreased Interest 0 0 0 0 0  Down, Depressed, Hopeless 0 0 0 0 0  PHQ - 2 Score 0 0 0 0 0        Review of Systems  Constitutional: Negative for fever.  HENT: Negative for congestion.   Eyes: Negative for visual disturbance.  Respiratory: Negative for cough.   Cardiovascular: Positive for palpitations (and tachycardia).  Genitourinary: Positive for menstrual problem. Negative for dysuria.  Musculoskeletal: Negative for back pain.  Skin: Negative for wound.  Neurological: Positive for  light-headedness and headaches.  Psychiatric/Behavioral: The patient is nervous/anxious.        Objective:  BP 184/110 mmHg  Pulse 130  Temp(Src) 98.5 F (36.9 C) (Oral)  Resp 18  Ht _0  (1.626 m)  Wt 156 lb (70.761 kg)  BMI 26.76 kg/m2  SpO2 100%  LMP 03/22/2016  Physical Exam  Constitutional: She is oriented to person, place, and time. She appears well-developed and well-nourished. No distress.  HENT:  Head: Normocephalic and atraumatic.  Eyes: Conjunctivae are normal. Pupils are equal, round, and reactive to light.  Neck: Neck supple. No thyromegaly present.  Cardiovascular: Regular rhythm, S1 normal, S2 normal and normal heart sounds.  Tachycardia present.  Exam reveals no gallop.   No murmur heard. Pulmonary/Chest: Effort normal. She has no decreased breath sounds. She has no wheezes. She has no rhonchi. She has no rales.  Musculoskeletal: Normal range  of motion.  Lymphadenopathy:    She has no cervical adenopathy.  Neurological: She is alert and oriented to person, place, and time.  Skin: Skin is warm and dry.  Psychiatric: Her behavior is normal. Her mood appears anxious.  Nursing note and vitals reviewed.  Second BP measured during exam at 160/100 on rt arm and 170/110 on her left arm.   Left arm 135/107 with pulse of 85 EKG: Normal sinus rhythm, no ischemic change  Orthostatics: Lying 168/99 Sitting 161/97 Standing 151/89     Assessment & Plan:   1. Elevated blood pressure   2. Tachycardia   3. Vitamin D deficiency   4. Menorrhagia with regular cycle   5. Secondary hyperparathyroidism ([place])   6. Hematuria     Orders Placed This Encounter  Procedures  . Urine culture  . CBC  . Thyroid Panel With TSH  . Comprehensive metabolic panel  . Ferritin  . Vitamin D, 25-hydroxy  . PTH, Intact and Calcium  . Orthostatic vital signs  . POCT urinalysis dipstick  . POCT Microscopic Urinalysis ([facility])  . EKG 12-Lead    Meds ordered this encounter    Medications  . ALPRAZolam (NIRAVAM) 0.25 MG dissolvable tablet    Sig: Take 1 tablet (0.25 mg total) by mouth 2 (two) times daily as needed for anxiety.    Dispense:  30 tablet    Refill:  1  . LORazepam (ATIVAN) 0.5 MG tablet    Sig: Take 1 tablet (0.5 mg total) by mouth every 6 (six) hours as needed for anxiety.    Dispense:  30 tablet    Refill:  1    I personally performed the services described in this documentation, which was scribed in my presence. The recorded information has been reviewed and considered, and addended by me as needed.   [name], M.D.  [facility] [address] [place], [place] [address] [phone] phone [phone] fax  04/08/2016 8:32 PM

## 2016-04-09 LAB — CBC
HCT: 43.7 % (ref 35.0–45.0)
Hemoglobin: 14.7 g/dL (ref 11.7–15.5)
MCH: 30.4 pg (ref 27.0–33.0)
MCHC: 33.6 g/dL (ref 32.0–36.0)
MCV: 90.5 fL (ref 80.0–100.0)
MPV: 9.2 fL (ref 7.5–12.5)
Platelets: 298 10*3/uL (ref 140–400)
RBC: 4.83 MIL/uL (ref 3.80–5.10)
RDW: 13.3 % (ref 11.0–15.0)
WBC: 7.8 10*3/uL (ref 3.8–10.8)

## 2016-04-09 LAB — COMPREHENSIVE METABOLIC PANEL
ALT: 14 U/L (ref 6–29)
AST: 17 U/L (ref 10–30)
Albumin: 4.6 g/dL (ref 3.6–5.1)
Alkaline Phosphatase: 56 U/L (ref 33–115)
BUN: 9 mg/dL (ref 7–25)
CO2: 19 mmol/L — ABNORMAL LOW (ref 20–31)
Calcium: 9.4 mg/dL (ref 8.6–10.2)
Chloride: 105 mmol/L (ref 98–110)
Creat: 1.07 mg/dL (ref 0.50–1.10)
Glucose, Bld: 90 mg/dL (ref 65–99)
Potassium: 4.1 mmol/L (ref 3.5–5.3)
Sodium: 140 mmol/L (ref 135–146)
Total Bilirubin: 0.8 mg/dL (ref 0.2–1.2)
Total Protein: 7.6 g/dL (ref 6.1–8.1)

## 2016-04-09 LAB — THYROID PANEL WITH TSH
Free Thyroxine Index: 2.8 (ref 1.4–3.8)
T3 Uptake: 35 % (ref 22–35)
T4, Total: 8 ug/dL (ref 4.5–12.0)
TSH: 1.19 mIU/L

## 2016-04-09 LAB — FERRITIN: Ferritin: 17 ng/mL (ref 10–232)

## 2016-04-10 LAB — URINE CULTURE: Colony Count: 30000

## 2016-04-10 NOTE — Progress Notes (Signed)
HPI The patient presents for evaluation of dizziness and presyncope as well as chest pain. She has had an extensive workup for palpitations. Since I last saw her she has seen our NPs. She has been in the emergency room again and I did review these records during this office visit.  She continues to complain of pain and palpitations. She did wear a 24 hour BP monitor which showed significant HTN only when awake.  We have documented a significant response to anxiety.  At the past meeting with Mr. Sharolyn Douglas she agreed to see a psychiatrist and she agreed to take Cardizem CD.  She was again having some chest pain but did not want further testing with a stress echo.   Unfortunately, she only took one Diltiazem.  She developed swelling and increased heart rate and other perceived symptoms.  She unfortunately continues to have many symptoms as previously stated.  She has palpitations and shooting chest pains and light headedness and labile BPs.   Allergies  Allergen Reactions  . Penicillins Hives    Has patient had a PCN reaction causing immediate rash, facial/tongue/throat swelling, SOB or lightheadedness with hypotension:Yes Has patient had a PCN reaction causing severe rash involving mucus membranes or skin necrosis:unsure Has patient had a PCN reaction that required hospitalization: Yes Has patient had a PCN reaction occurring within the last 10 years: No If all of the above answers are "NO", then may proceed with Cephalosporin use.     . Atenolol     Fatigue   . Coreg [Carvedilol]     Fatigue   . Tamiflu [Oseltamivir Phosphate] Nausea And Vomiting    Excessive vomiting  . Tinidazole     Caused severe abd pain/type of colitis     Current Outpatient Prescriptions  Medication Sig Dispense Refill  . ALPRAZolam (NIRAVAM) 0.25 MG dissolvable tablet Take 1 tablet (0.25 mg total) by mouth 2 (two) times daily as needed for anxiety. 30 tablet 1  . blood glucose meter kit and supplies Test blood  sugar twice daily. Dx code: R73.09 1 each 0  . diltiazem (CARDIZEM CD) 120 MG 24 hr capsule Take 1 capsule (120 mg total) by mouth daily. (Patient not taking: Reported on 04/08/2016) 30 capsule 11  . ergocalciferol (VITAMIN D2) 50000 units capsule Take 1 capsule (50,000 Units total) by mouth once a week. 12 capsule 1  . fluticasone (FLONASE) 50 MCG/ACT nasal spray Place 1 spray into both nostrils daily as needed for allergies.     Marland Kitchen glucose blood test strip Test blood sugar twice daily. Dx code: R73.09 200 each 3  . ibuprofen (ADVIL,MOTRIN) 200 MG tablet Take 200 mg by mouth every 6 (six) hours as needed for moderate pain.    Marland Kitchen LORazepam (ATIVAN) 0.5 MG tablet Take 1 tablet (0.5 mg total) by mouth every 6 (six) hours as needed for anxiety. 30 tablet 1  . Norethindrone-Ethinyl Estradiol-Fe Biphas (LO LOESTRIN FE) 1 MG-10 MCG / 10 MCG tablet Take 1 tablet by mouth daily.    [name] DELICA LANCETS 60A MISC Test blood sugar twice daily. Dx code: R73.09 200 each 3  . Propylene Glycol (SYSTANE BALANCE) 0.6 % SOLN Apply 1 drop to eye daily as needed (for dry eyes).     . ranitidine (ZANTAC) 150 MG tablet TAKE ONE TABLET BY MOUTH TWICE DAILY 30 tablet 2   No current facility-administered medications for this visit.    Past Medical History  Diagnosis Date  . Essential hypertension  a. 01/2016 Renal duplex: no RAS- ? renal cyst (renal u/s 4/17 - no cyst, nl study); b. 02/2016 24 hr BP Monitor: Mean SBP ~ 138 with Max of 198. Highest pressures noted in evening hours following placement of cuff.  Marland Kitchen GERD (gastroesophageal reflux disease)   . Palpitations     a. 09/2014 nl event monitor.  . Diabetes mellitus without complication ([place])   . Fibroid uterus   . Back pain   . Chest pain     a. 09/2014 Echo: EF 60-65%, no rwma, nl valves;  b. 12/2014 Neg ETT.  Marland Kitchen Anxiety     Past Surgical History  Procedure Laterality Date  . Wisdom tooth extraction       ROS:  As stated in the HPI and negative for  all other systems.  PHYSICAL EXAM LMP 03/22/2016  GENERAL:  Well appearing NECK:  No jugular venous distention, waveform within normal limits, carotid upstroke brisk and symmetric, no bruits, no thyromegaly LUNGS:  Clear to auscultation bilaterally BACK:  No CVA tenderness CHEST:  Unremarkable HEART:  PMI not displaced or sustained,S1 and S2 within normal limits, no S3, no S4, no clicks, no rubs, no murmurs ABD:  Flat, positive bowel sounds normal in frequency in pitch, no bruits, no rebound, no guarding, no midline pulsatile mass, no hepatomegaly, no splenomegaly EXT:  2 plus pulses throughout, no edema, no cyanosis no clubbing  EKG:  Sinus rhythm, rate 86, axis within normal limits, intervals within normal limits, no acute ST-T wave changes.  04/11/2016  ASSESSMENT AND PLAN  TACHYCARDIA/PALPITATIONS:  I had another long conversation with her.  I cannot manage her BP or HR further until she is actively seeing a psychiatrist/therapist and having management of her previously diagnosed GAD/PTSD.  Until that time she is encouraged to start her Cardizem 120 mg daily.    HTN:  As above.   (Greater than 25 minutes reviewing all data with greater than 50% face to face with the patient).

## 2016-04-11 ENCOUNTER — Telehealth: Payer: Self-pay | Admitting: Cardiology

## 2016-04-11 ENCOUNTER — Telehealth: Payer: Self-pay | Admitting: *Deleted

## 2016-04-11 ENCOUNTER — Ambulatory Visit (INDEPENDENT_AMBULATORY_CARE_PROVIDER_SITE_OTHER): Payer: 59 | Admitting: Cardiology

## 2016-04-11 ENCOUNTER — Encounter (HOSPITAL_COMMUNITY): Payer: Self-pay | Admitting: *Deleted

## 2016-04-11 ENCOUNTER — Encounter: Payer: Self-pay | Admitting: Cardiology

## 2016-04-11 VITALS — BP 160/80 | HR 86 | Ht 64.0 in | Wt 155.5 lb

## 2016-04-11 DIAGNOSIS — R0789 Other chest pain: Secondary | ICD-10-CM | POA: Diagnosis present

## 2016-04-11 DIAGNOSIS — Z86018 Personal history of other benign neoplasm: Secondary | ICD-10-CM | POA: Diagnosis not present

## 2016-04-11 DIAGNOSIS — Z880 Allergy status to penicillin: Secondary | ICD-10-CM | POA: Insufficient documentation

## 2016-04-11 DIAGNOSIS — Z793 Long term (current) use of hormonal contraceptives: Secondary | ICD-10-CM | POA: Insufficient documentation

## 2016-04-11 DIAGNOSIS — Z3202 Encounter for pregnancy test, result negative: Secondary | ICD-10-CM | POA: Insufficient documentation

## 2016-04-11 DIAGNOSIS — F419 Anxiety disorder, unspecified: Secondary | ICD-10-CM | POA: Diagnosis not present

## 2016-04-11 DIAGNOSIS — E119 Type 2 diabetes mellitus without complications: Secondary | ICD-10-CM | POA: Diagnosis not present

## 2016-04-11 DIAGNOSIS — I10 Essential (primary) hypertension: Secondary | ICD-10-CM | POA: Diagnosis not present

## 2016-04-11 DIAGNOSIS — R072 Precordial pain: Secondary | ICD-10-CM | POA: Diagnosis not present

## 2016-04-11 DIAGNOSIS — Z79899 Other long term (current) drug therapy: Secondary | ICD-10-CM | POA: Diagnosis not present

## 2016-04-11 DIAGNOSIS — Z87891 Personal history of nicotine dependence: Secondary | ICD-10-CM | POA: Insufficient documentation

## 2016-04-11 DIAGNOSIS — R000 Tachycardia, unspecified: Secondary | ICD-10-CM | POA: Diagnosis not present

## 2016-04-11 DIAGNOSIS — K219 Gastro-esophageal reflux disease without esophagitis: Secondary | ICD-10-CM | POA: Insufficient documentation

## 2016-04-11 LAB — PTH, INTACT AND CALCIUM
Calcium: 9 mg/dL (ref 8.4–10.5)
PTH: 70 pg/mL — ABNORMAL HIGH (ref 14–64)

## 2016-04-11 LAB — CBC WITH DIFFERENTIAL/PLATELET
Basophils Absolute: 0 10*3/uL (ref 0.0–0.1)
Basophils Relative: 0 %
Eosinophils Absolute: 0 10*3/uL (ref 0.0–0.7)
Eosinophils Relative: 0 %
HCT: 40.6 % (ref 36.0–46.0)
Hemoglobin: 14 g/dL (ref 12.0–15.0)
Lymphocytes Relative: 51 %
Lymphs Abs: 4.7 10*3/uL — ABNORMAL HIGH (ref 0.7–4.0)
MCH: 29.8 pg (ref 26.0–34.0)
MCHC: 34.5 g/dL (ref 30.0–36.0)
MCV: 86.4 fL (ref 78.0–100.0)
Monocytes Absolute: 0.7 10*3/uL (ref 0.1–1.0)
Monocytes Relative: 8 %
Neutro Abs: 3.7 10*3/uL (ref 1.7–7.7)
Neutrophils Relative %: 41 %
Platelets: 252 10*3/uL (ref 150–400)
RBC: 4.7 MIL/uL (ref 3.87–5.11)
RDW: 12.2 % (ref 11.5–15.5)
WBC: 9.2 10*3/uL (ref 4.0–10.5)

## 2016-04-11 LAB — COMPREHENSIVE METABOLIC PANEL
ALT: 16 U/L (ref 14–54)
AST: 20 U/L (ref 15–41)
Albumin: 4.4 g/dL (ref 3.5–5.0)
Alkaline Phosphatase: 52 U/L (ref 38–126)
Anion gap: 10 (ref 5–15)
BUN: 7 mg/dL (ref 6–20)
CO2: 24 mmol/L (ref 22–32)
Calcium: 9.3 mg/dL (ref 8.9–10.3)
Chloride: 102 mmol/L (ref 101–111)
Creatinine, Ser: 0.99 mg/dL (ref 0.44–1.00)
GFR calc Af Amer: >60 mL/min (ref >60)
GFR calc non Af Amer: >60 mL/min (ref >60)
Glucose, Bld: 121 mg/dL — ABNORMAL HIGH (ref 65–99)
Potassium: 3 mmol/L — ABNORMAL LOW (ref 3.5–5.1)
Sodium: 136 mmol/L (ref 135–145)
Total Bilirubin: 1.6 mg/dL — ABNORMAL HIGH (ref 0.3–1.2)
Total Protein: 7.6 g/dL (ref 6.5–8.1)

## 2016-04-11 LAB — URINALYSIS, ROUTINE W REFLEX MICROSCOPIC
Bilirubin Urine: NEGATIVE
Glucose, UA: NEGATIVE mg/dL
Ketones, ur: NEGATIVE mg/dL
Leukocytes, UA: NEGATIVE
Nitrite: NEGATIVE
Protein, ur: NEGATIVE mg/dL
Specific Gravity, Urine: 1.004 — ABNORMAL LOW (ref 1.005–1.030)
pH: 6.5 (ref 5.0–8.0)

## 2016-04-11 LAB — VITAMIN D 25 HYDROXY (VIT D DEFICIENCY, FRACTURES): Vit D, 25-Hydroxy: 30 ng/mL (ref 30–100)

## 2016-04-11 LAB — I-STAT TROPONIN, ED: Troponin i, poc: 0 ng/mL (ref 0.00–0.08)

## 2016-04-11 LAB — URINE MICROSCOPIC-ADD ON

## 2016-04-11 LAB — POC URINE PREG, ED: Preg Test, Ur: NEGATIVE

## 2016-04-11 NOTE — ED Notes (Signed)
Patient presents with c/o felling funny elevated BP (started Cardizem Sunday morning) saw cardiologist this AM and was told this feeling is from anxiety not the meds.  {atient stated she has been taking meds and nothing made her feel like this

## 2016-04-11 NOTE — Patient Instructions (Signed)
Medication Instructions:  Continue current medications  Labwork: NONE  Testing/Procedures: NONE  Follow-Up: Your physician wants you to follow-up in: 6 Months. You will receive a reminder letter in the mail two months in advance. If you don't receive a letter, please call our office to schedule the follow-up appointment.   Any Other Special Instructions Will Be Listed Below (If Applicable).   If you need a refill on your cardiac medications before your next appointment, please call your pharmacy.   

## 2016-04-11 NOTE — Telephone Encounter (Signed)
Pt called and was concerned about bp meds.  She states that her bp med makes her feel like she was going to pass out and she wanted to know your advice on what to do.  She stated that she called the cardiologist and they told her that she should keep taking the diltiazem because she only been taking it for a day.    She also wanted to start back on buspar again at a higher dose.  I told her that she just need to come in for an office visit and that she needs to contact her cardiologist in the morning about bp meds.  Dr. Carmie End

## 2016-04-11 NOTE — Telephone Encounter (Signed)
Left msg for patient to call. 

## 2016-04-11 NOTE — Telephone Encounter (Signed)
New message  Pt c/o medication issue: 1. Name of Medication: diltiazem (CARDIZEM CD) 120 MG 24 hr capsule  4. What is your medication issue? Pt called request a call back to discuss if there is another dose that she can take twice a day. Because she believes that its too much for her. It makes her too tired and she has a " wierd feeling" Please call

## 2016-04-11 NOTE — Telephone Encounter (Signed)
Left message to call back  

## 2016-04-11 NOTE — Telephone Encounter (Signed)
Follow up   Pt verbalized that she is returning call to return call to rn

## 2016-04-12 ENCOUNTER — Emergency Department (HOSPITAL_COMMUNITY)
Admission: EM | Admit: 2016-04-12 | Discharge: 2016-04-12 | Disposition: A | Discharge status: Home / Self Care | Payer: 59 | Attending: Emergency Medicine | Admitting: Emergency Medicine

## 2016-04-12 ENCOUNTER — Ambulatory Visit (INDEPENDENT_AMBULATORY_CARE_PROVIDER_SITE_OTHER): Payer: 59 | Admitting: Family Medicine

## 2016-04-12 VITALS — BP 134/78 | HR 94 | Temp 98.9°F | Resp 16 | Ht 63.0 in | Wt 153.0 lb

## 2016-04-12 DIAGNOSIS — N390 Urinary tract infection, site not specified: Secondary | ICD-10-CM | POA: Diagnosis not present

## 2016-04-12 DIAGNOSIS — R030 Elevated blood-pressure reading, without diagnosis of hypertension: Secondary | ICD-10-CM | POA: Diagnosis not present

## 2016-04-12 DIAGNOSIS — R072 Precordial pain: Secondary | ICD-10-CM

## 2016-04-12 DIAGNOSIS — R0989 Other specified symptoms and signs involving the circulatory and respiratory systems: Secondary | ICD-10-CM

## 2016-04-12 DIAGNOSIS — F418 Other specified anxiety disorders: Secondary | ICD-10-CM | POA: Diagnosis not present

## 2016-04-12 DIAGNOSIS — F410 Panic disorder [episodic paroxysmal anxiety] without agoraphobia: Secondary | ICD-10-CM

## 2016-04-12 DIAGNOSIS — R002 Palpitations: Secondary | ICD-10-CM | POA: Diagnosis not present

## 2016-04-12 DIAGNOSIS — I10 Essential (primary) hypertension: Secondary | ICD-10-CM

## 2016-04-12 DIAGNOSIS — F064 Anxiety disorder due to known physiological condition: Secondary | ICD-10-CM

## 2016-04-12 MED ORDER — POTASSIUM CHLORIDE CRYS ER 20 MEQ PO TBCR
60.0000 meq | EXTENDED_RELEASE_TABLET | Freq: Once | ORAL | Status: AC
  Administered 2016-04-12: 60 meq via ORAL
  Filled 2016-04-12: qty 3

## 2016-04-12 MED ORDER — AMLODIPINE BESYLATE 10 MG PO TABS: 10.0000 mg | ORAL_TABLET | Freq: Every day | ORAL | Status: DC

## 2016-04-12 MED ORDER — CARVEDILOL 3.125 MG PO TABS
3.1250 mg | ORAL_TABLET | Freq: Two times a day (BID) | ORAL | Status: DC
Start: 2016-04-12 — End: 2016-05-05

## 2016-04-12 MED ORDER — AMLODIPINE BESYLATE 5 MG PO TABS
10.0000 mg | ORAL_TABLET | Freq: Once | ORAL | Status: DC
  Filled 2016-04-12: qty 2

## 2016-04-12 MED ORDER — BUSPIRONE HCL 7.5 MG PO TABS: 7.5000 mg | ORAL_TABLET | Freq: Two times a day (BID) | ORAL | Status: DC

## 2016-04-12 NOTE — Telephone Encounter (Signed)
She and I went over this.  She can try the Norvasc.  Follow up as previously planned.  Please refer to my office note.

## 2016-04-12 NOTE — Patient Instructions (Signed)
     IF you received an x-ray today, you will receive an invoice from [facility]. Please contact [facility] at [phone] with questions or concerns regarding your invoice.   IF you received labwork today, you will receive an invoice from [facility]. Please contact [facility] at [phone] with questions or concerns regarding your invoice.   Our billing staff will not be able to assist you with questions regarding bills from these companies.  You will be contacted with the lab results as soon as they are available. The fastest way to get your results is to activate your My Chart account. Instructions are located on the last page of this paperwork. If you have not heard from us regarding the results in 2 weeks, please contact this office.      

## 2016-04-12 NOTE — Progress Notes (Signed)
Subjective:  By signing my name below, I, [name], attest that this documentation has been prepared under the direction and in the presence of [name], MD. Electronically Signed: [name], [place]. 04/12/2016 , 8:02 PM .  Patient was seen in Room 4 .   Patient ID: Brooke Turner, female    DOB: 1976-02-27, 40 y.o.   MRN: [id number] Chief Complaint  Patient presents with  . Hospitalization Follow-up    ER last night, wants to discuss BP medication   HPI Brooke Turner is a 40 y.o. female who presents to [facility] for hospitalization follow up as she was in the ER last night.  Patient states that she's seen cardiology recently and was prescribed Cardizem to try for a few days. She was informed if she doesn't like it, then stop taking the medication. She was previously taking Coreg, but it caused her to feel sluggish and fatigue. She went to cardiologist yesterday (Dr. Percival Spanish) and was instructed to continue on her medication. She also mentions heart was pounding, and checked her heart rate measuring in the 70s. She tried taking a lorazepam to slow her heart rate down.   She reports feeling faint and lightheaded, "like she was going to pass out" while in her car. She checked her BP and it was running around 140/80. She notes eating some crackers and then went to her friend's place to hang out. However, patient felt her BP symptom was worsening, feeling warm with chest tightening. She checked her BP at that time, measuring at 175/107 with persistent dizziness. She went to the ER at that time.   Patient notes being tachycardic in the morning, with heart rate at 80s~90s. She states this is high for her, as it'll continue to go up after she has activity. She wants to know if her symptoms are caused by her anxiety or her HTN. She mentions driving to the ER occasionally and would sit in her car of the parking lot. She would take a dose of lorazepam and would eventually calm down.  She notes feeling "weird" with some anxiety while on buspar in the past. But, one of her coworkers is on buspar without bad side effects and is doing well on it.   She's stopped working out in over a month. Her cousin passed away (46 year old) in 2023/02/11 due to liver failure. She was stressed too because she had to drive up to [place] by herself after argument with her partner at that time.   She reports seeing her gynecologist for yeast infection and bladder infection. She took medications for yeast infection which cleared up. She denies treating for the bladder infection.   Past Medical History  Diagnosis Date  . Essential hypertension     a. 02/11/2016 Renal duplex: no RAS- ? renal cyst (renal u/s 4/17 - no cyst, nl study); b. 02/2016 24 hr BP Monitor: Mean SBP ~ 138 with Max of 198. Highest pressures noted in evening hours following placement of cuff.  Marland Kitchen GERD (gastroesophageal reflux disease)   . Palpitations     a. 09/2014 nl event monitor.  . Diabetes mellitus without complication ([place])   . Fibroid uterus   . Back pain   . Chest pain     a. 09/2014 Echo: EF 60-65%, no rwma, nl valves;  b. 12/2014 Neg ETT.  Marland Kitchen Anxiety    Prior to Admission medications   Medication Sig Start Date End Date Taking? Authorizing Provider  acetaminophen (TYLENOL) 500  MG tablet Take 500 mg by mouth every 6 (six) hours as needed for moderate pain.   Yes Historical Provider, MD  ALPRAZolam (NIRAVAM) 0.25 MG dissolvable tablet Take 1 tablet (0.25 mg total) by mouth 2 (two) times daily as needed for anxiety. 04/08/16  Yes [name], MD  amLODipine (NORVASC) 10 MG tablet Take 1 tablet (10 mg total) by mouth daily. 04/12/16  Yes [name], MD  blood glucose meter kit and supplies Test blood sugar twice daily. Dx code: R73.09 07/22/15  Yes [name], MD  busPIRone (BUSPAR) 7.5 MG tablet Take 1 tablet (7.5 mg total) by mouth 2 (two) times daily. Then increase to tid in 1 week 04/12/16  Yes [name], MD  ergocalciferol  (VITAMIN D2) 50000 units capsule Take 1 capsule (50,000 Units total) by mouth once a week. 02/10/16  Yes [name], MD  fluticasone (FLONASE) 50 MCG/ACT nasal spray Place 1 spray into both nostrils daily as needed for allergies.    Yes Historical Provider, MD  glucose blood test strip Test blood sugar twice daily. Dx code: R73.09 07/22/15  Yes [name], MD  LORazepam (ATIVAN) 0.5 MG tablet Take 1 tablet (0.5 mg total) by mouth every 6 (six) hours as needed for anxiety. 04/08/16  Yes [name], MD  Norethindrone-Ethinyl Estradiol-Fe Biphas (LO LOESTRIN FE) 1 MG-10 MCG / 10 MCG tablet Take 1 tablet by mouth daily.   Yes Historical Provider, MD  [facility] DELICA LANCETS 79T MISC Test blood sugar twice daily. Dx code: R73.09 07/22/15  Yes [name], MD  Propylene Glycol (SYSTANE BALANCE) 0.6 % SOLN Apply 1 drop to eye daily as needed (for dry eyes).    Yes Historical Provider, MD  ranitidine (ZANTAC) 150 MG tablet TAKE ONE TABLET BY MOUTH TWICE DAILY 03/18/16  Yes [name], MD   Allergies  Allergen Reactions  . Penicillins Hives    Has patient had a PCN reaction causing immediate rash, facial/tongue/throat swelling, SOB or lightheadedness with hypotension:Yes Has patient had a PCN reaction causing severe rash involving mucus membranes or skin necrosis:unsure Has patient had a PCN reaction that required hospitalization: Yes Has patient had a PCN reaction occurring within the last 10 years: No If all of the above answers are "NO", then may proceed with Cephalosporin use.     . Atenolol     Fatigue   . Coreg [Carvedilol]     Fatigue   . Diltiazem     Dizzy, felt like she was going to pass out Blood pressure went up per patient   . Tamiflu [Oseltamivir Phosphate] Nausea And Vomiting    Excessive vomiting  . Tinidazole     Caused severe abd pain/type of colitis     Review of Systems  Constitutional: Negative for fever, chills and fatigue.  Respiratory: Negative for chest tightness.     Cardiovascular: Negative for chest pain and palpitations.  Gastrointestinal: Negative for nausea and vomiting.  Neurological: Negative for dizziness, light-headedness and headaches.  Psychiatric/Behavioral: Negative for suicidal ideas. The patient is nervous/anxious.        Objective:   Physical Exam  Constitutional: She is oriented to person, place, and time. She appears well-developed and well-nourished. No distress.  HENT:  Head: Normocephalic and atraumatic.  Eyes: EOM are normal. Pupils are equal, round, and reactive to light.  Neck: Neck supple.  Cardiovascular: Normal rate.   Pulmonary/Chest: Effort normal. No respiratory distress.  Musculoskeletal: Normal range of motion.  Neurological: She is alert and oriented to person, place, and time.  Skin: Skin is warm and dry.  Psychiatric: She has a normal mood and affect. Her behavior is normal.  Nursing note and vitals reviewed.   BP 134/78 mmHg  Pulse 94  Temp(Src) 98.9 F (37.2 C)  Resp 16  Ht 5' 3" (1.6 m)  Wt 153 lb (69.4 kg)  BMI 27.11 kg/m2  SpO2 100%  LMP 03/22/2016    Assessment & Plan:   1. Labile blood pressure   2. UTI (lower urinary tract infection)   3. Anxiety disorder due to general medical condition with panic attack   4. Anxiety about health   5. Precordial chest pain   6. Palpitations    I spend sig time reinforcing to pt that she is going to need a therapist/psychologist to help [place] deal with her anxiety component if she ever wants these symptoms to be manageable.  Pt has a very strong mind-body connection that currently is really exacerbating her problems so I have hope that if she could get really involved in neurofeedback or other techniques, that she might be able to learn skills where her mind could help lower blood pressure and resolve palpitations rather than always making them worse.  Pt reluctantly agreeable. Pt is welcome to try buspar. However, due to the severity of her symptoms as well as  the side effect list, I am doubtful that she is going to be able to tolerate any dose sig enough to help. She reports she did have some side effects even when she was just taking 66m qd of buspar. I do not think pt is every going to be able to tolerate cardizem - regardless of its effect, she has now attributed sig somatic symptoms to it and I think that her mind will cont to produce these whenever the cardizem is taken regardless of its true effect. Pt never tried propranolol other than a 237mIR tab once or twice.  She reports that an ER doctor told her that if she stayed on a beta-blocker for to long it could result in irreversible symptomatic bradycardia later in life when off of it - this is clearly not true and pt misunderstood but she is still very reluctant to let go of this perception. Of the numerous medications which she has tried for a very short period, she has definitely done the best on carvedilol - it did cause some fatigue which is why she stopped it but it also did stop her palpitations and did a decent job of controlling her BP and she did have a several month period of manageable symptoms after the carvedilol so I STRONGLY encouraged pt to restart this.  Pt did happen to have a UClx + for GBS sev d ago - she is asymptomatic so will push fluids and RTC for lab only visit to recheck ua in sev d.  Orders Placed This Encounter  Procedures  . Urine culture    Standing Status: Future     Number of Occurrences:      Standing Expiration Date: 04/13/2017  . POCT urinalysis dipstick    Standing Status: Future     Number of Occurrences:      Standing Expiration Date: 04/13/2017  . POCT Microscopic Urinalysis ([facility])    Standing Status: Future     Number of Occurrences:      Standing Expiration Date: 04/13/2017    Meds ordered this encounter  Medications  . carvedilol (COREG)  3.125 MG tablet    Sig: Take 1 tablet (3.125 mg total) by mouth 2 (two) times daily with a meal.    Dispense:  60  tablet    Refill:  0   Over 40 min spent in face-to-face evaluation of and consultation with patient and coordination of care.  Over 50% of this time was spent counseling this patient.  I personally performed the services described in this documentation, which was scribed in my presence. The recorded information has been reviewed and considered, and addended by me as needed.   [name], M.D.  [facility] [address] [place], [place] [address] [phone] phone [phone] fax  04/13/2016 1:13 PM

## 2016-04-12 NOTE — Telephone Encounter (Signed)
I sent buspar to the pharmacy. Pt needs to know that its side effects when first starting can include drowsiness, nervousness, fatigue, numbness, weakness, blurred vision but that if she is having any of these symptoms, if she sticks it out they should go away after 3-4 days.   Pt was seen in the ed and they stopped her ditiazem and put her on amlodipine. Instructed to recheck with me in 3d as she did have some bacteria in her urine. Also, her cardiologist really reinforced that she HAS to start seeing a psychiatrist if she wants to feel better.  Has she made an appt with someone yet?

## 2016-04-12 NOTE — Telephone Encounter (Signed)
Advised patient, verbalized understanding  

## 2016-04-12 NOTE — Discharge Instructions (Signed)
How to Take Your Blood Pressure Brooke Turner, read below on how to properly take your blood pressure at home.  Start taking amlodipine daily and do not combine with any other blood pressure medication unless instructed by a physician. See your primary care doctor in 3 days for repeat evaluation. If any symptoms worsen, come back to the ED immediately. Thank you. HOW DO I GET A BLOOD PRESSURE MACHINE?  You can buy an electronic home blood pressure machine at your local pharmacy. Insurance will sometimes cover the cost if you have a prescription.  Ask your doctor what type of machine is best for you. There are different machines for your arm and your wrist.  If you decide to buy a machine to check your blood pressure on your arm, first check the size of your arm so you can buy the right size cuff. To check the size of your arm:   Use a measuring tape that shows both inches and centimeters.   Wrap the measuring tape around the upper-middle part of your arm. You may need someone to help you measure.   Write down your arm measurement in both inches and centimeters.   To measure your blood pressure correctly, it is important to have the right size cuff.   If your arm is up to 13 inches (up to 34 centimeters), get an adult cuff size.  If your arm is 13 to 17 inches (35 to 44 centimeters), get a large adult cuff size.    If your arm is 17 to 20 inches (45 to 52 centimeters), get an adult thigh cuff.  WHAT DO THE NUMBERS MEAN?   There are two numbers that make up your blood pressure. For example: 120/80.  The first number (120 in our example) is called the "systolic pressure." It is a measure of the pressure in your blood vessels when your heart is pumping blood.  The second number (80 in our example) is called the "diastolic pressure." It is a measure of the pressure in your blood vessels when your heart is resting between beats.  Your doctor will tell you what your blood  pressure should be. WHAT SHOULD I DO BEFORE I CHECK MY BLOOD PRESSURE?   Try to rest or relax for at least 30 minutes before you check your blood pressure.  Do not smoke.  Do not have any drinks with caffeine, such as:  Soda.  Coffee.  Tea.  Check your blood pressure in a quiet room.  Sit down and stretch out your arm on a table. Keep your arm at about the level of your heart. Let your arm relax.  Make sure that your legs are not crossed. HOW DO I CHECK MY BLOOD PRESSURE?  Follow the directions that came with your machine.  Make sure you remove any tight-fitting clothing from your arm or wrist. Wrap the cuff around your upper arm or wrist. You should be able to fit a finger between the cuff and your arm. If you cannot fit a finger between the cuff and your arm, it is too tight and should be removed and rewrapped.  Some units require you to manually pump up the arm cuff.  Automatic units inflate the cuff when you press a button.  Cuff deflation is automatic in both models.  After the cuff is inflated, the unit measures your blood pressure and pulse. The readings are shown on a monitor. Hold still and breathe normally while the cuff is inflated.  Getting  a reading takes less than a minute.  Some models store readings in a memory. Some provide a printout of readings. If your machine does not store your readings, keep a written record.  Take readings with you to your next visit with your doctor.   This information is not intended to replace advice given to you by your health care provider. Make sure you discuss any questions you have with your health care provider.   Document Released: 10/06/2008 Document Revised: 11/14/2014 Document Reviewed: 12/19/2013 Elsevier Interactive Patient Education [employer].

## 2016-04-12 NOTE — ED Provider Notes (Signed)
CSN: [id number]     Arrival date & time 04/11/16  2227 History  By signing my name below, I, Brooke Turner, attest that this documentation has been prepared under the direction and in the presence of [name], MD. Electronically Signed: Judithann Sauger, ED Scribe. 04/12/2016. 1:41 AM.    Chief Complaint  Patient presents with  . Hypertension  . Tachycardia   The history is provided by the patient. No language interpreter was used.   HPI Comments: Brooke Turner is a 40 y.o. female with a hx of HTN, palpitations, and DM who presents to the Emergency Department complaining of multiple intermittent episodes of feeling "weird" with flushing of the face, face tightness, palpitations, and mild SOB onset 2 days after starting Cardizem for her HTN. She reports associated burning chest tightness, and mild bilateral leg swelling (worse on right). No alleviating factors noted. Pt has not tried any medication for these episodes. She denies any recent surgeries or any long car/plane trips. Pt is currently on birth control pills. She states that she has tried other HTN medications in the past and has not had these episodes. No fever, chills, or n/v.   PCP: Dr. Brigitte Pulse   Past Medical History  Diagnosis Date  . Essential hypertension     a. 01/2016 Renal duplex: no RAS- ? renal cyst (renal u/s 4/17 - no cyst, nl study); b. 02/2016 24 hr BP Monitor: Mean SBP ~ 138 with Max of 198. Highest pressures noted in evening hours following placement of cuff.  Marland Kitchen GERD (gastroesophageal reflux disease)   . Palpitations     a. 09/2014 nl event monitor.  . Diabetes mellitus without complication ([place])   . Fibroid uterus   . Back pain   . Chest pain     a. 09/2014 Echo: EF 60-65%, no rwma, nl valves;  b. 12/2014 Neg ETT.  Marland Kitchen Anxiety    Past Surgical History  Procedure Laterality Date  . Wisdom tooth extraction     Family History  Problem Relation Age of Onset  . Seizures Mother   . Cancer Father      Liver  . Diabetes Father 64  . Hypertension Father   . Heart disease Father 55    CEA, LE [place]  . Alzheimer's disease Maternal Grandmother   . Heart disease Maternal Grandfather    Social History  Substance Use Topics  . Smoking status: Former Smoker -- 0.25 packs/day for 10 years    Types: Cigarettes    Quit date: 08/19/2014  . Smokeless tobacco: Never Used  . Alcohol Use: No   OB History    Gravida Para Term Preterm AB TAB SAB Ectopic Multiple Living   2 0 0 0 2 1 1 0 0 0      Review of Systems  Constitutional: Negative for fever and chills.  Respiratory: Positive for chest tightness and shortness of breath.   Cardiovascular: Positive for palpitations.  Gastrointestinal: Negative for nausea and vomiting.  All other systems reviewed and are negative.     Allergies  Penicillins; Atenolol; Coreg; Tamiflu; and Tinidazole  Home Medications   Prior to Admission medications   Medication Sig Start Date End Date Taking? Authorizing Provider  acetaminophen (TYLENOL) 500 MG tablet Take 500 mg by mouth every 6 (six) hours as needed for moderate pain.    Historical Provider, MD  ALPRAZolam (NIRAVAM) 0.25 MG dissolvable tablet Take 1 tablet (0.25 mg total) by mouth 2 (two) times daily as needed for anxiety. 04/08/16  [name], MD  blood glucose meter kit and supplies Test blood sugar twice daily. Dx code: R73.09 07/22/15   [name], MD  diltiazem (CARDIZEM CD) 120 MG 24 hr capsule Take 1 capsule (120 mg total) by mouth daily. 03/31/16   Rogelia Mire, NP  ergocalciferol (VITAMIN D2) 50000 units capsule Take 1 capsule (50,000 Units total) by mouth once a week. 02/10/16   [name], MD  fluticasone (FLONASE) 50 MCG/ACT nasal spray Place 1 spray into both nostrils daily as needed for allergies.     Historical Provider, MD  glucose blood test strip Test blood sugar twice daily. Dx code: R73.09 07/22/15   [name], MD  LORazepam (ATIVAN) 0.5 MG tablet Take 1 tablet (0.5 mg  total) by mouth every 6 (six) hours as needed for anxiety. 04/08/16   [name], MD  Norethindrone-Ethinyl Estradiol-Fe Biphas (LO LOESTRIN FE) 1 MG-10 MCG / 10 MCG tablet Take 1 tablet by mouth daily.    Historical Provider, MD  [facility] DELICA LANCETS 16S MISC Test blood sugar twice daily. Dx code: R73.09 07/22/15   [name], MD  Propylene Glycol (SYSTANE BALANCE) 0.6 % SOLN Apply 1 drop to eye daily as needed (for dry eyes).     Historical Provider, MD  ranitidine (ZANTAC) 150 MG tablet TAKE ONE TABLET BY MOUTH TWICE DAILY 03/18/16   [name], MD   BP 195/98 mmHg  Pulse 131  Temp(Src) 98.9 F (37.2 C) (Oral)  Resp 20  SpO2 99%  LMP 03/22/2016 Physical Exam  Constitutional: She is oriented to person, place, and time. She appears well-developed and well-nourished. No distress.  HENT:  Head: Normocephalic and atraumatic.  Nose: Nose normal.  Mouth/Throat: Oropharynx is clear and moist. No oropharyngeal exudate.  Eyes: Conjunctivae and EOM are normal. Pupils are equal, round, and reactive to light. No scleral icterus.  Neck: Normal range of motion. Neck supple. No JVD present. No tracheal deviation present. No thyromegaly present.  Cardiovascular: Normal rate, regular rhythm and normal heart sounds.  Exam reveals no gallop and no friction rub.   No murmur heard. Pulmonary/Chest: Effort normal and breath sounds normal. No respiratory distress. She has no wheezes. She exhibits no tenderness.  Abdominal: Soft. Bowel sounds are normal. She exhibits no distension and no mass. There is no tenderness. There is no rebound and no guarding.  Musculoskeletal: Normal range of motion. She exhibits no edema or tenderness.  Lymphadenopathy:    She has no cervical adenopathy.  Neurological: She is alert and oriented to person, place, and time. No cranial nerve deficit. She exhibits normal muscle tone.  Skin: Skin is warm and dry. No rash noted. No erythema. No pallor.  Nursing note and vitals  reviewed.   ED Course  Procedures (including critical care time) DIAGNOSTIC STUDIES: Oxygen Saturation is 99% on RA, normal by my interpretation.    COORDINATION OF CARE: 1:40 AM- Pt advised of plan for treatment and pt agrees. Recommended to stop the Cardizem and start the standard first line HTN medication. Pt will be prescribed Norvasc to try it out for several days.    Labs Review Labs Reviewed  CBC WITH DIFFERENTIAL/PLATELET - Abnormal; Notable for the following:    Lymphs Abs 4.7 (*)    All other components within normal limits  COMPREHENSIVE METABOLIC PANEL - Abnormal; Notable for the following:    Potassium 3.0 (*)    Glucose, Bld 121 (*)    Total Bilirubin  1.6 (*)    All other components within normal limits  URINALYSIS, ROUTINE W REFLEX MICROSCOPIC (NOT AT [facility]) - Abnormal; Notable for the following:    Color, Urine STRAW (*)    Specific Gravity, Urine 1.004 (*)    Hgb urine dipstick SMALL (*)    All other components within normal limits  URINE MICROSCOPIC-ADD ON - Abnormal; Notable for the following:    Squamous Epithelial / LPF 0-5 (*)    Bacteria, UA MANY (*)    All other components within normal limits  I-STAT TROPOININ, ED  POC URINE PREG, ED    Imaging Review No results found.   [name], MD has personally reviewed and evaluated these images and lab results as part of his medical decision-making.   EKG Interpretation   Date/Time:  Monday April 11 2016 22:33:02 EDT Ventricular Rate:  121 PR Interval:  170 QRS Duration: 84 QT Interval:  300 QTC Calculation: 426 R Axis:   72 Text Interpretation:  Sinus tachycardia Otherwise normal ECG No  significant change since last tracing Confirmed by Glynn Octave  [phone]) on 04/12/2016 12:52:35 AM      MDM   Final diagnoses:  None   Patient presents to the ED for adverse reaction to diltiazem. She has tried several HTN meds but she has not liked the effects of any of them including lisinopril,  coreg, and dilt.  She was placed on these medications because she had tachycardia as well.  Her her HR has been between 70 and 90.  She was advised to stop her medication and I will place her on amlodipine for a reduced side effect profile and to help her HTN.    K+ replaced in the ED.  UA shows bacteria but patient is asymptomatic.  PCP fu advised within 3 days. She appears well and in NAD. VS remain within her normal limits and she is safe for DC.    I personally performed the services described in this documentation, which was scribed in my presence. The recorded information has been reviewed and is accurate.     [name], MD 04/12/16 [id number]

## 2016-04-12 NOTE — Telephone Encounter (Signed)
Spoke with patient and she did take the diltiazem yesterday and ended up having to go to ED  Patient stated that after taking the Diltiazem yesterday she became very dizzy and felt like she was going to pass out. Blood pressure elevated to 175/107 and she had increased anxiety, felt this all related to the Diltiazem She was told by ED to not take anymore of the Diltiazem and Rx for Amlodipine given to patient  Patient has not started the Amlodipine Blood pressure this am around 10:15 am 117/93 HR 113  She would like for Dr Percival Spanish to review and give recommendations. Will forward for review

## 2016-04-12 NOTE — ED Notes (Signed)
MD at bedside. 

## 2016-04-13 ENCOUNTER — Emergency Department (HOSPITAL_COMMUNITY): Payer: 59

## 2016-04-13 ENCOUNTER — Telehealth: Payer: Self-pay | Admitting: Cardiology

## 2016-04-13 ENCOUNTER — Encounter (HOSPITAL_COMMUNITY): Payer: Self-pay | Admitting: Vascular Surgery

## 2016-04-13 ENCOUNTER — Telehealth: Payer: Self-pay

## 2016-04-13 ENCOUNTER — Emergency Department (HOSPITAL_COMMUNITY)
Admission: EM | Admit: 2016-04-13 | Discharge: 2016-04-13 | Disposition: A | Discharge status: Home / Self Care | Payer: 59 | Attending: Emergency Medicine | Admitting: Emergency Medicine

## 2016-04-13 DIAGNOSIS — F419 Anxiety disorder, unspecified: Secondary | ICD-10-CM | POA: Insufficient documentation

## 2016-04-13 DIAGNOSIS — Z87891 Personal history of nicotine dependence: Secondary | ICD-10-CM | POA: Diagnosis not present

## 2016-04-13 DIAGNOSIS — Z79899 Other long term (current) drug therapy: Secondary | ICD-10-CM | POA: Diagnosis not present

## 2016-04-13 DIAGNOSIS — R002 Palpitations: Secondary | ICD-10-CM | POA: Diagnosis not present

## 2016-04-13 DIAGNOSIS — Z880 Allergy status to penicillin: Secondary | ICD-10-CM | POA: Diagnosis not present

## 2016-04-13 DIAGNOSIS — I10 Essential (primary) hypertension: Secondary | ICD-10-CM | POA: Diagnosis not present

## 2016-04-13 DIAGNOSIS — R0602 Shortness of breath: Secondary | ICD-10-CM | POA: Diagnosis not present

## 2016-04-13 DIAGNOSIS — K219 Gastro-esophageal reflux disease without esophagitis: Secondary | ICD-10-CM | POA: Diagnosis not present

## 2016-04-13 DIAGNOSIS — Z793 Long term (current) use of hormonal contraceptives: Secondary | ICD-10-CM | POA: Insufficient documentation

## 2016-04-13 DIAGNOSIS — Z86018 Personal history of other benign neoplasm: Secondary | ICD-10-CM | POA: Insufficient documentation

## 2016-04-13 DIAGNOSIS — E119 Type 2 diabetes mellitus without complications: Secondary | ICD-10-CM | POA: Diagnosis not present

## 2016-04-13 DIAGNOSIS — R079 Chest pain, unspecified: Secondary | ICD-10-CM | POA: Diagnosis present

## 2016-04-13 LAB — BASIC METABOLIC PANEL
Anion gap: 10 (ref 5–15)
BUN: 5 mg/dL — ABNORMAL LOW (ref 6–20)
CO2: 23 mmol/L (ref 22–32)
Calcium: 9.4 mg/dL (ref 8.9–10.3)
Chloride: 105 mmol/L (ref 101–111)
Creatinine, Ser: 1.03 mg/dL — ABNORMAL HIGH (ref 0.44–1.00)
GFR calc Af Amer: >60 mL/min (ref >60)
GFR calc non Af Amer: >60 mL/min (ref >60)
Glucose, Bld: 107 mg/dL — ABNORMAL HIGH (ref 65–99)
Potassium: 3.8 mmol/L (ref 3.5–5.1)
Sodium: 138 mmol/L (ref 135–145)

## 2016-04-13 LAB — CBC
HCT: 41.5 % (ref 36.0–46.0)
Hemoglobin: 14.3 g/dL (ref 12.0–15.0)
MCH: 29.7 pg (ref 26.0–34.0)
MCHC: 34.5 g/dL (ref 30.0–36.0)
MCV: 86.3 fL (ref 78.0–100.0)
Platelets: 302 10*3/uL (ref 150–400)
RBC: 4.81 MIL/uL (ref 3.87–5.11)
RDW: 12.4 % (ref 11.5–15.5)
WBC: 8.7 10*3/uL (ref 4.0–10.5)

## 2016-04-13 LAB — I-STAT TROPONIN, ED
Troponin i, poc: 0 ng/mL (ref 0.00–0.08)
Troponin i, poc: 0 ng/mL (ref 0.00–0.08)

## 2016-04-13 LAB — D-DIMER, QUANTITATIVE (NOT AT ARMC): D-Dimer, Quant: <0.27 ug/mL-FEU (ref 0.00–0.50)

## 2016-04-13 MED ORDER — NITROGLYCERIN 0.4 MG SL SUBL
0.4000 mg | SUBLINGUAL_TABLET | SUBLINGUAL | Status: DC | PRN
  Administered 2016-04-13: 0.4 mg via SUBLINGUAL
  Filled 2016-04-13: qty 1

## 2016-04-13 MED ORDER — AMLODIPINE BESYLATE 10 MG PO TABS: 10.0000 mg | ORAL_TABLET | Freq: Every day | ORAL | Status: DC

## 2016-04-13 MED ORDER — ASPIRIN 81 MG PO CHEW
324.0000 mg | CHEWABLE_TABLET | Freq: Once | ORAL | Status: AC
  Administered 2016-04-13: 324 mg via ORAL
  Filled 2016-04-13: qty 4

## 2016-04-13 NOTE — Telephone Encounter (Signed)
Tried to call patient, however, number seemed to be out of service.  It just rang busy over and over when I tried calling back.

## 2016-04-13 NOTE — Telephone Encounter (Signed)
Left message for pt to call.

## 2016-04-13 NOTE — Addendum Note (Signed)
Addended by: [name] on: 04/13/2016 10:42 AM   Modules accepted: Miquel Dunn

## 2016-04-13 NOTE — ED Notes (Signed)
Pt reports to the ED for eval of left sided CP and tightness. Pt reports the pain radiates into her left arm as well. Pain onset today. She states she recently started on a new BP medication (carvediolol 3.25 mg). She states she has been on this medication but it has been over a year. Pt reports associated symptoms of lightheadedness and anxiety. She is also tachycardic in the 120s-130s. Pt A&Ox4, resp e/u, and skin warm and dry.

## 2016-04-13 NOTE — Telephone Encounter (Signed)
Pt states she started the beta blocker today that dr Brigitte Pulse prescribed her and she is experiencing on and off again chest pain should she be concerned  Best number [phone]

## 2016-04-13 NOTE — ED Provider Notes (Signed)
Complains of left-sided parasternal chest pain which sometimes radiates to the right anterior chest onset 3 PM today worse with changing positions or with deep inspiration. No other associated symptoms. Patient recently started treated with carvedilol for hypertension. Chronic risk factors include hypertension otherwise negative. On exam no distress lungs clear auscultation heart regular rate and rhythm no murmurs or rubs Chest pain felt to be noncardiac. Her score was 1. Low pretest clinical suspicion for pulmonary embolism. Negative d-dimer  Orlie Dakin, MD 04/13/16 2326

## 2016-04-13 NOTE — ED Provider Notes (Signed)
CSN: [id number]     Arrival date & time 04/13/16  1604 History   First MD Initiated Contact with Patient 04/13/16 1924     Chief Complaint  Patient presents with  . Chest Pain    Patient is a 40 y.o. female presenting with chest pain.  Chest Pain Pain location:  Substernal area Pain quality: radiating   Pain radiates to:  R shoulder Pain radiates to the back: no   Pain severity:  Mild Onset quality:  Gradual Timing:  Constant Progression:  Waxing and waning Chronicity:  Recurrent Context: breathing and at rest   Context: no drug use, not eating, no intercourse, not lifting, no movement, not raising an arm, no stress and no trauma   Relieved by:  Nothing Exacerbated by: started immediately after taking her first dose of carvedilol. Ineffective treatments:  None tried Associated symptoms: anxiety, palpitations and shortness of breath   Associated symptoms: no abdominal pain, no back pain, no claudication, no cough, no dizziness, no dysphagia, no fatigue, no fever, no headache, no heartburn, no lower extremity edema, no numbness, no orthopnea, no PND and not vomiting   Risk factors: female sex   Risk factors: no immobilization, not pregnant, no prior DVT/PE, no smoking and no surgery      Past Medical History  Diagnosis Date  . Essential hypertension     a. 01/2016 Renal duplex: no RAS- ? renal cyst (renal u/s 4/17 - no cyst, nl study); b. 02/2016 24 hr BP Monitor: Mean SBP ~ 138 with Max of 198. Highest pressures noted in evening hours following placement of cuff.  Marland Kitchen GERD (gastroesophageal reflux disease)   . Palpitations     a. 09/2014 nl event monitor.  . Diabetes mellitus without complication ([place])   . Fibroid uterus   . Back pain   . Chest pain     a. 09/2014 Echo: EF 60-65%, no rwma, nl valves;  b. 12/2014 Neg ETT.  Marland Kitchen Anxiety    Past Surgical History  Procedure Laterality Date  . Wisdom tooth extraction     Family History  Problem Relation Age of Onset  . Seizures  Mother   . Cancer Father     Liver  . Diabetes Father 52  . Hypertension Father   . Heart disease Father 48    CEA, LE [place]  . Alzheimer's disease Maternal Grandmother   . Heart disease Maternal Grandfather    Social History  Substance Use Topics  . Smoking status: Former Smoker -- 0.25 packs/day for 10 years    Types: Cigarettes    Quit date: 08/19/2014  . Smokeless tobacco: Never Used  . Alcohol Use: No   OB History    Gravida Para Term Preterm AB TAB SAB Ectopic Multiple Living   2 0 0 0 _0 0 0 0     Review of Systems  Constitutional: Negative for fever and fatigue.  HENT: Negative for trouble swallowing.   Respiratory: Positive for shortness of breath. Negative for cough.   Cardiovascular: Positive for chest pain and palpitations. Negative for orthopnea, claudication and PND.  Gastrointestinal: Negative for heartburn, vomiting and abdominal pain.  Musculoskeletal: Negative for back pain.  Skin: Negative for rash and wound.  Allergic/Immunologic: Negative for immunocompromised state.  Neurological: Negative for dizziness, numbness and headaches.  All other systems reviewed and are negative.     Allergies  Penicillins; Diltiazem; Tamiflu; and Tinidazole  Home Medications   Prior to Admission medications   Medication  Sig Start Date End Date Taking? Authorizing Provider  acetaminophen (TYLENOL) 500 MG tablet Take 500 mg by mouth every 6 (six) hours as needed for moderate pain.   Yes Historical Provider, MD  ALPRAZolam (NIRAVAM) 0.25 MG dissolvable tablet Take 1 tablet (0.25 mg total) by mouth 2 (two) times daily as needed for anxiety. 04/08/16  Yes [name], MD  blood glucose meter kit and supplies Test blood sugar twice daily. Dx code: R73.09 07/22/15  Yes [name], MD  carvedilol (COREG) 3.125 MG tablet Take 1 tablet (3.125 mg total) by mouth 2 (two) times daily with a meal. 04/12/16  Yes [name], MD  ergocalciferol (VITAMIN D2) 50000 units capsule Take 1  capsule (50,000 Units total) by mouth once a week. 02/10/16  Yes [name], MD  fluticasone (FLONASE) 50 MCG/ACT nasal spray Place 1 spray into both nostrils daily as needed for allergies.    Yes Historical Provider, MD  glucose blood test strip Test blood sugar twice daily. Dx code: R73.09 07/22/15  Yes [name], MD  LORazepam (ATIVAN) 0.5 MG tablet Take 1 tablet (0.5 mg total) by mouth every 6 (six) hours as needed for anxiety. 04/08/16  Yes [name], MD  Norethindrone-Ethinyl Estradiol-Fe Biphas (LO LOESTRIN FE) 1 MG-10 MCG / 10 MCG tablet Take 1 tablet by mouth daily.   Yes Historical Provider, MD  [facility] DELICA LANCETS 09W MISC Test blood sugar twice daily. Dx code: R73.09 07/22/15  Yes [name], MD  Propylene Glycol (SYSTANE BALANCE) 0.6 % SOLN Apply 1 drop to eye daily as needed (for dry eyes).    Yes Historical Provider, MD  ranitidine (ZANTAC) 150 MG tablet TAKE ONE TABLET BY MOUTH TWICE DAILY 03/18/16  Yes [name], MD  amLODipine (NORVASC) 10 MG tablet Take 1 tablet (10 mg total) by mouth daily. 04/13/16   Karma Greaser, MD  busPIRone (BUSPAR) 7.5 MG tablet Take 1 tablet (7.5 mg total) by mouth 2 (two) times daily. Then increase to tid in 1 week 04/12/16   [name], MD   BP 150/80 mmHg  Pulse 78  Temp(Src) 98.1 F (36.7 C) (Oral)  Resp 16  SpO2 100%  LMP 03/22/2016 Physical Exam  Constitutional: She appears well-developed and well-nourished. No distress.  HENT:  Head: Normocephalic and atraumatic.  Eyes: Conjunctivae are normal. Right eye exhibits no discharge. Left eye exhibits no discharge.  Neck: Normal range of motion. Neck supple. No JVD present.  Cardiovascular: Normal rate, regular rhythm, normal heart sounds and intact distal pulses.   No murmur heard. Pulmonary/Chest: Effort normal and breath sounds normal. No respiratory distress.  Abdominal: Soft. Bowel sounds are normal. She exhibits no distension and no mass. There is no tenderness. There is no rebound and  no guarding.  Musculoskeletal: She exhibits no edema (no swelling of LEs, no calf tenderness).  Neurological: She is alert.  Skin: Skin is warm. No rash noted.  Psychiatric: She has a normal mood and affect.  Nursing note and vitals reviewed.   ED Course  Procedures (including critical care time) Labs Review Labs Reviewed  BASIC METABOLIC PANEL - Abnormal; Notable for the following:    Glucose, Bld 107 (*)    BUN 5 (*)    Creatinine, Ser 1.03 (*)    All other components within normal limits  CBC  D-DIMER, QUANTITATIVE (NOT AT [facility]  I-STAT TROPOININ, ED  Randolm Idol, ED    Imaging Review Dg Chest  2 View  04/13/2016  CLINICAL DATA:  Left-sided chest pain EXAM: CHEST  2 VIEW COMPARISON:  03/20/16 FINDINGS: The heart size and mediastinal contours are within normal limits. Both lungs are clear. The visualized skeletal structures are unremarkable. IMPRESSION: No active cardiopulmonary disease. Electronically Signed   By: Inez Catalina M.D.   On: 04/13/2016 16:50   I have personally reviewed and evaluated these images and lab results as part of my medical decision-making.   EKG Interpretation   Date/Time:  Wednesday April 13 2016 16:07:09 EDT Ventricular Rate:  129 PR Interval:  146 QRS Duration: 76 QT Interval:  298 QTC Calculation: 436 R Axis:   39 Text Interpretation:  Sinus tachycardia Biatrial enlargement Abnormal ECG  No significant change since last tracing Confirmed by Winfred Leeds  MD, SAM  [phone]) on 04/13/2016 11:45:06 PM      MDM   Final diagnoses:  Chest pain, unspecified chest pain type    Doubt ACS. Patient has an atypical presentation for coronary artery disease in her here score is 1. Patient has no significant family history, only comorbidity is hypertension and diabetes, her history is otherwise low risk and her EKG has no evidence of ischemic changes. Patient has been evaluated  multiple times for chest pain with unremarkable findings. Recent d-dimer  negative. TSH within normal limits recently. She sees a cardiologist for hypertension and has been attempting multiple medications but always develops abnormal feeling of flushing, chest pain. She states that she took the carvedilol today and immediately after started feeling flushed with chest pain. She had a similar episode when she took tiredness and she discontinued after 2 days. She is afraid of stopping the medication as she read on the Internet that she could have a heart attack from this. Denies any fevers cough or infectious symptoms and chest x-ray does not reveal a pneumonia. Her troponin here is negative 2. D-dimer negative and patient is low risk overall, doubt PE. Doubt dissection. Patient adamantly tachycardic but sinus rhythm. Suspect secondary to anxiety. Reviewed side effects of medications with patient and advised patient that she likely needs to do. Prior before discontinuing. The chest that she is experiencing does not appear to be cardiac or pulmonary. She also developed symptoms similar to this with nitroglycerin. Patient offered to take Norvasc instead but patient hesitant to do this. She was encouraged to continue her home regimen and follow up with her primary care provider. Return for hemoptysis, intractable vomiting, worsening severe chest pain, worsening severe shortness of breath or other concerning symptoms. Patient discharged in good condition.    Karma Greaser, MD 04/14/16 0000  Orlie Dakin, MD 04/14/16 [phone]

## 2016-04-13 NOTE — Telephone Encounter (Signed)
[place]  Pt wanted to inform Dr Percival Spanish that she is not taking amlodipine but was prescribed by her PCP- carvedilol (3.125) Please call back and discuss.

## 2016-04-14 NOTE — Telephone Encounter (Signed)
Left msg for patient w advice to follow up w PCP and to take the medication as indicated by Dr. Percival Spanish (no cardizem, no carvedilol, take amlodipine as prescribed by ED physician)

## 2016-04-14 NOTE — Telephone Encounter (Signed)
I have another message on another medication but have not called today yet. Can you advise so I can discuss both at the same time?

## 2016-04-14 NOTE — Telephone Encounter (Signed)
Left msg to call.

## 2016-04-14 NOTE — Telephone Encounter (Signed)
She was not on Coreg per my last note.  She couldn't tolerate Cardizem apparently.  ED doctor started Norvasc and she can try this.  She needs to follow with her PCP.

## 2016-04-14 NOTE — Telephone Encounter (Signed)
I have attempted to reach this patient to inquire on the issues she is having, and left msgs to call the office back.  Per note, she wants a message w/ instructions left on her VM, but I am at a loss on how to instruct her. Note she has placed several recent calls regarding medications.  I cannot confirm whether she is taking her medications as prescribed.  Please advise if recommended to stop the 3.125mg  BID carvedilol and/or replace w/ other med.

## 2016-04-14 NOTE — Telephone Encounter (Signed)
Pt checking on status of this message. She is having chest tightness and pain, not as bad as yesterday. Her cardiologist told her to stop taking this. Please advise [phone]

## 2016-04-14 NOTE — Telephone Encounter (Signed)
Follow Up  Pt request a call back to discuss if the carvedilol can cause any chest pain pressure or Angina. Per pt, Please just leave a message because per pt she doesn't want to call back if she doesn't have to.

## 2016-04-14 NOTE — Telephone Encounter (Signed)
The beta blocker is carvedilol (COREG) 3.125 MG tablet [id number]. She is still having chest pains and would like to stop taking this med. Please advise at [phone]

## 2016-04-14 NOTE — Telephone Encounter (Signed)
New Message  Pt c/o medication issue: 1. Name of Medication: Carvedolol  4. What is your medication issue? Pt called states that after she took this medication it gave her chest tightness and a burning sensation. She states that she went to the ER and they advised to follow up with the cardiologist. She is requesting a call back to discuss if this should happen or if she should switch to an alternate medication.   Comments: pt states that she is ok with side effects but not chest pain or discomfort. Please call

## 2016-04-15 ENCOUNTER — Telehealth: Payer: Self-pay | Admitting: Cardiology

## 2016-04-15 NOTE — Telephone Encounter (Signed)
Returned call to patient, no answer. Left message thanking her for her information from her PCP. Encouraged her to continue to see her PCP for her medications and to call us back if she has any questions.

## 2016-04-15 NOTE — Telephone Encounter (Signed)
F/u  Pt returning RN phone call. Please call back and discuss.   

## 2016-04-15 NOTE — Telephone Encounter (Signed)
F/u Message   Pt returning RN call. PT stated she wanted RN to know primary care doctor keep her on the koreg for a few more day. Then he was to try norvasc. Pt also stated primary care doctor put koreg on her allergy list, but think it's removed now.Marland Kitchen

## 2016-04-15 NOTE — Telephone Encounter (Signed)
Returned call. Discussed recommendations w/ patient. She wanted [place] to know PCP had advised resuming carvedilol since she had done well on it in the past. Recommended to follow PCP advice - and that if she is doing well on carvedilol, to continue. We discussed her long term strategies for health management. Advised that the BP and HR concerns can be adequately followed by PCP at this time per Dr. Rosezella [place] recommendations. Recommended further evaluation for her anxiety and PTSD by appropriate provider (psych referral) and to call us if we may be of assistance or if she has further concerns.  She verbalized understanding and thanks.

## 2016-04-15 NOTE — Telephone Encounter (Signed)
Called pt and spent 30 min discussing plan. At this point, her chest pain is getting better and so she chooses to cont on the carvedilol to see if her symtpoms might cont to resolve. If they do not, she wants to consider amlodipine and or bystolic. Has considered making an appt with psych - again reinforced that she needs to.

## 2016-04-25 ENCOUNTER — Encounter: Payer: Self-pay | Admitting: Internal Medicine

## 2016-04-25 ENCOUNTER — Ambulatory Visit (INDEPENDENT_AMBULATORY_CARE_PROVIDER_SITE_OTHER): Payer: 59 | Admitting: Internal Medicine

## 2016-04-25 VITALS — BP 118/66 | HR 89 | Ht 64.0 in | Wt 154.4 lb

## 2016-04-25 DIAGNOSIS — E669 Obesity, unspecified: Secondary | ICD-10-CM | POA: Diagnosis not present

## 2016-04-25 DIAGNOSIS — R0683 Snoring: Secondary | ICD-10-CM | POA: Diagnosis not present

## 2016-04-25 DIAGNOSIS — F418 Other specified anxiety disorders: Secondary | ICD-10-CM | POA: Diagnosis not present

## 2016-04-25 NOTE — Progress Notes (Signed)
12/24/2015-40 year old female former smoker-Self referral; had sleep study at [facility]. Pt states she did not understand the final results of sleep study so it was recommended to come here and discuss as second opinon per Dr Rexene Alberts.  She has been followed at this office by Dr. Melvyn Novas for pulmonary evaluation of dyspnea. NPSG 11/18/14 [facility]/ [facility]- AHI 0.2 per hour with some snoring, mild periodic limb movement. Sister has obstructive sleep apnea. Patient sleeps alone without reporter. Aware she snores loudly. She describes waking at night feeling short of breath with confusion and heart racing. Sometimes wakes up choking. Bedtime varies from 11 PM to 2 AM, up several times during the night before finally up between 7 and 8 AM. Reports over 70 pound weight loss in the past 2 years. No ENT surgery. Denies pregnancy. "Afraid to go to sleep"because of these events. Lorazepam 0.5 mg, one half tablet, helps some. Previous cardiology evaluation for palpitation-less frequent now. Medical problems include high blood pressure, DM 2  04/25/2016-40 year old female former smoker followed for sleep concerns/insomnia FOLLOWS FOR: review sleep study with patient. NPSG 01/19/16- Normal with snoring, AHI 0.2/ hr, desat to 93%, body weight 166 lbs We reviewed her sleep study. I explained normal range findings. She describes anxiety if she wakes with sense of heart racing or shortness of breath but no such events were recorded during the sleep study. She is being evaluated by cardiology for the heart rhythm. She is sleeping alone with no witnessed events.  ROS-see HPI   Negative unless "+" Constitutional:    + weight loss, night sweats, fevers, chills, fatigue, lassitude. HEENT:    headaches, difficulty swallowing, tooth/dental problems, sore throat,       sneezing, itching, ear ache, nasal congestion, post nasal drip, snoring CV:    chest pain, orthopnea, PND, swelling in lower extremities, anasarca,                                                           dizziness, + palpitations Resp:   shortness of breath with exertion or at rest.                productive cough,   non-productive cough, coughing up of blood.              change in color of mucus.  wheezing.   Skin:    rash or lesions. GI:  No-   heartburn, indigestion, abdominal pain, nausea, vomiting, diarrhea,                 change in bowel habits, loss of appetite GU: dysuria, change in color of urine, no urgency or frequency.   flank pain. MS:   joint pain, stiffness, decreased range of motion, back pain. Neuro-     nothing unusual Psych:  change in mood or affect.  depression, + anxiety.   memory loss.  OBJ- Physical Exam General- Alert, Oriented, Affect-appropriate, Distress- none acute, + talkative Skin- rash-none, lesions- none, excoriation- none Lymphadenopathy- none Head- atraumatic            Eyes- Gross vision intact, PERRLA, conjunctivae and secretions clear            Ears- Hearing, canals-normal            Nose- Clear, no-Septal dev, mucus, polyps,  erosion, perforation             Throat- Mallampati II-III , mucosa clear , drainage- none, tonsils- atrophic Neck- flexible , trachea midline, no stridor , thyroid nl, carotid no bruit Chest - symmetrical excursion , unlabored           Heart/CV- RRR , no murmur , no gallop  , no rub, nl s1 s2                           - JVD- none , edema- none, stasis changes- none, varices- none           Lung- clear to P&A, wheeze- none, cough- none , dullness-none, rub- none           Chest wall-  Abd-  Br/ Gen/ Rectal- Not done, not indicated Extrem- cyanosis- none, clubbing, none, atrophy- none, strength- nl Neuro- grossly intact to observation

## 2016-04-25 NOTE — Patient Instructions (Signed)
Consider a non-prescription otc med for sleep if it works well enough, such as benadryl or melatonin  You could experiment with an otc snoring treatment if you care to- such as a chin strap or mouth piece for snoring.

## 2016-04-26 NOTE — Assessment & Plan Note (Signed)
I explained that data available to me looks normal. We discussed commonly used OTC medications available for snoring

## 2016-04-26 NOTE — Assessment & Plan Note (Signed)
Simple snoring without obstructive sleep apnea and without witnessed events, significant desaturation or arrhythmia/palpitations on her study night. She comes across as a little anxious but it is appropriate that cardiology cleared the rhythm question. We discussed simple conservative measures such as chin straps and OTC mouthpieces for snoring without obstructive sleep apnea.

## 2016-04-26 NOTE — Assessment & Plan Note (Signed)
She continues to gradually lose weight and apparently has reached a weight range taken a round of obstructive sleep apnea risk by her description.

## 2016-05-05 ENCOUNTER — Telehealth: Payer: Self-pay

## 2016-05-05 ENCOUNTER — Other Ambulatory Visit: Payer: Self-pay | Admitting: Family Medicine

## 2016-05-05 NOTE — Telephone Encounter (Signed)
Patient is calling to see if Dr. Brigitte Pulse would prescribe albuterol. Patient states that 3 weeks ago Dr. Brigitte Pulse verbally told her that it will be prescribed. [facility] [address]

## 2016-05-07 ENCOUNTER — Other Ambulatory Visit: Payer: Self-pay | Admitting: Family Medicine

## 2016-05-07 MED ORDER — ALBUTEROL SULFATE 108 (90 BASE) MCG/ACT IN AEPB
2.0000 | INHALATION_SPRAY | RESPIRATORY_TRACT | Status: DC | PRN
Start: 2016-05-07 — End: 2016-10-25

## 2016-05-07 NOTE — Telephone Encounter (Signed)
Sent in - remind pt that this can definitely cause shakiness, jitteriness, and palpitations but it will not be harmful to her and should go away within 30 minutes of using it.

## 2016-05-08 NOTE — Telephone Encounter (Signed)
Tried to call pt, but got recording that the number is invalid. I checked and had dialed the correct number. I do not have any other numbers for pt.

## 2016-05-09 NOTE — Telephone Encounter (Signed)
Pt's mother called back and stated that pt is aware of the SEs of the albuterol. She stated that pt's number is correct in our system and is not sure why call wouldn't go through, but she will check on it and have pt call if needs updating.

## 2016-05-15 ENCOUNTER — Encounter (HOSPITAL_COMMUNITY): Payer: Self-pay | Admitting: *Deleted

## 2016-05-15 ENCOUNTER — Ambulatory Visit (HOSPITAL_COMMUNITY)
Admission: EM | Admit: 2016-05-15 | Discharge: 2016-05-15 | Disposition: A | Discharge status: Home / Self Care | Payer: 59 | Attending: Family Medicine | Admitting: Family Medicine

## 2016-05-15 DIAGNOSIS — R0789 Other chest pain: Secondary | ICD-10-CM

## 2016-05-15 MED ORDER — DICLOFENAC POTASSIUM 50 MG PO TABS: 50.0000 mg | ORAL_TABLET | Freq: Three times a day (TID) | ORAL | Status: DC

## 2016-05-15 NOTE — ED Notes (Signed)
Pt  Reports  l  Upper  Chest   Pain   With   Shortness  Of  Breath     With  Pain  On palpation   Sensation  Of   Swelling  As  Well    Symptoms    For  Over  1  Week     pt has  A  History  Of  Hypertension     -  She is  Sitting  Upright  On the  Exam table  Speaking      In  Complete  sentances           Skin is  Warm  And  Dry     No  Recent  [place]

## 2016-05-15 NOTE — ED Notes (Signed)
Pt  States  She   Was    Slightly late  On taking  Her  bp  meds   -  Pt  Advised  To  followup   On her  bp      Dr Juventino Slovak notified

## 2016-05-15 NOTE — ED Provider Notes (Signed)
CSN: [id number]     Arrival date & time 05/15/16  1311 History   First MD Initiated Contact with Patient 05/15/16 1321     Chief Complaint  Patient presents with  . Chest Pain   (Consider location/radiation/quality/duration/timing/severity/associated sxs/prior Treatment) Patient is a 40 y.o. female presenting with chest pain. The history is provided by the patient.  Chest Pain Pain location:  L lateral chest Pain quality: sharp   Pain radiates to:  Does not radiate Pain radiates to the back: no   Pain severity:  Mild Onset quality:  Gradual Duration:  8 days Progression:  Unchanged Chronicity:  New Context: raising an arm   Relieved by:  Certain positions Associated symptoms: no fever, no palpitations and no shortness of breath   Risk factors comment:  H/o nonsp chest pains.   Past Medical History  Diagnosis Date  . Essential hypertension     a. 01/2016 Renal duplex: no RAS- ? renal cyst (renal u/s 4/17 - no cyst, nl study); b. 02/2016 24 hr BP Monitor: Mean SBP ~ 138 with Max of 198. Highest pressures noted in evening hours following placement of cuff.  Marland Kitchen GERD (gastroesophageal reflux disease)   . Palpitations     a. 09/2014 nl event monitor.  . Diabetes mellitus without complication ([place])   . Fibroid uterus   . Back pain   . Chest pain     a. 09/2014 Echo: EF 60-65%, no rwma, nl valves;  b. 12/2014 Neg ETT.  Marland Kitchen Anxiety    Past Surgical History  Procedure Laterality Date  . Wisdom tooth extraction     Family History  Problem Relation Age of Onset  . Seizures Mother   . Cancer Father     Liver  . Diabetes Father 63  . Hypertension Father   . Heart disease Father 54    CEA, LE Stenting  . Alzheimer's disease Maternal Grandmother   . Heart disease Maternal Grandfather    Social History  Substance Use Topics  . Smoking status: Former Smoker -- 0.25 packs/day for 10 years    Types: Cigarettes    Quit date: 08/19/2014  . Smokeless tobacco: Never Used  . Alcohol Use:  No   OB History    Gravida Para Term Preterm AB TAB SAB Ectopic Multiple Living   2 0 0 0 2 1 1 0 0 0      Review of Systems  Constitutional: Negative for fever.  Respiratory: Negative for chest tightness, shortness of breath and wheezing.   Cardiovascular: Positive for chest pain. Negative for palpitations and leg swelling.  All other systems reviewed and are negative.   Allergies  Penicillins; Diltiazem; Tamiflu; and Tinidazole  Home Medications   Prior to Admission medications   Medication Sig Start Date End Date Taking? Authorizing Provider  acetaminophen (TYLENOL) 500 MG tablet Take 500 mg by mouth every 6 (six) hours as needed for moderate pain.    Historical Provider, MD  Albuterol Sulfate (PROAIR RESPICLICK) 440 (90 Base) MCG/ACT AEPB Inhale 2 puffs into the lungs every 4 (four) hours as needed. 05/07/16   [name], MD  ALPRAZolam (NIRAVAM) 0.25 MG dissolvable tablet Take 1 tablet (0.25 mg total) by mouth 2 (two) times daily as needed for anxiety. 04/08/16   [name], MD  blood glucose meter kit and supplies Test blood sugar twice daily. Dx code: R73.09 07/22/15   [name], MD  carvedilol (COREG) 3.125 MG tablet TAKE ONE TABLET BY MOUTH TWICE  DAILY WITH A MEAL 05/07/16   [name], MD  diclofenac (CATAFLAM) 50 MG tablet Take 1 tablet (50 mg total) by mouth 3 (three) times daily. For chest pain 05/15/16   Billy Fischer, MD  ergocalciferol (VITAMIN D2) 50000 units capsule Take 1 capsule (50,000 Units total) by mouth once a week. 02/10/16   [name], MD  fluticasone (FLONASE) 50 MCG/ACT nasal spray Place 1 spray into both nostrils daily as needed for allergies.     Historical Provider, MD  glucose blood test strip Test blood sugar twice daily. Dx code: R73.09 07/22/15   [name], MD  LORazepam (ATIVAN) 0.5 MG tablet Take 1 tablet (0.5 mg total) by mouth every 6 (six) hours as needed for anxiety. 04/08/16   [name], MD  Norethindrone-Ethinyl Estradiol-Fe Biphas (LO LOESTRIN FE) 1  MG-10 MCG / 10 MCG tablet Take 1 tablet by mouth daily.    Historical Provider, MD  [facility] DELICA LANCETS 77A MISC Test blood sugar twice daily. Dx code: R73.09 07/22/15   [name], MD  Propylene Glycol (SYSTANE BALANCE) 0.6 % SOLN Apply 1 drop to eye daily as needed (for dry eyes).     Historical Provider, MD  ranitidine (ZANTAC) 150 MG tablet TAKE ONE TABLET BY MOUTH TWICE DAILY 05/09/16   [name], MD   Meds Ordered and Administered this Visit  Medications - No data to display  BP 186/97 mmHg  Pulse 97  Temp(Src) 99 F (37.2 C) (Oral)  Resp 16  LMP  (Within Months) No data found.   Physical Exam  Constitutional: She is oriented to person, place, and time. She appears well-developed and well-nourished.  Neck: Normal range of motion. Neck supple.  Cardiovascular: Normal rate, normal heart sounds and intact distal pulses.   Pulmonary/Chest: Effort normal and breath sounds normal. She has no wheezes. She exhibits tenderness.  Acutely tender left upper chest wall to palp, reproducing sx.  Lymphadenopathy:    She has no cervical adenopathy.  Neurological: She is alert and oriented to person, place, and time.  Skin: Skin is warm and dry.  Nursing note and vitals reviewed.   ED Course  Procedures (including critical care time)  Labs Review Labs Reviewed - No data to display  Imaging Review No results found.   Visual Acuity Review  Right Eye Distance:   Left Eye Distance:   Bilateral Distance:    Right Eye Near:   Left Eye Near:    Bilateral Near:         MDM   1. Left-sided chest wall pain        Billy Fischer, MD 05/15/16 1344

## 2016-05-21 ENCOUNTER — Other Ambulatory Visit: Payer: Self-pay | Admitting: Family Medicine

## 2016-05-23 ENCOUNTER — Telehealth: Payer: Self-pay

## 2016-05-23 NOTE — Telephone Encounter (Signed)
Patient request a refill on Lorazepam. [facility] on [address] [phone].

## 2016-05-24 ENCOUNTER — Telehealth: Payer: Self-pay

## 2016-05-24 NOTE — Telephone Encounter (Signed)
Waiting on payment of $84.75 for 217 pages from [facility] law office

## 2016-05-24 NOTE — Telephone Encounter (Signed)
Already sent through refill pool.

## 2016-05-25 NOTE — Telephone Encounter (Signed)
faxed to [place] for pt

## 2016-06-06 NOTE — Telephone Encounter (Signed)
Payment was received and records were faxed on 06/06/16

## 2016-06-07 ENCOUNTER — Ambulatory Visit: Payer: 59

## 2016-06-08 ENCOUNTER — Encounter: Payer: Self-pay | Admitting: Family Medicine

## 2016-06-08 ENCOUNTER — Ambulatory Visit (INDEPENDENT_AMBULATORY_CARE_PROVIDER_SITE_OTHER): Payer: 59 | Admitting: Family Medicine

## 2016-06-08 VITALS — BP 132/80 | HR 89 | Temp 98.4°F | Resp 17 | Ht 63.0 in | Wt 154.0 lb

## 2016-06-08 DIAGNOSIS — R002 Palpitations: Secondary | ICD-10-CM | POA: Diagnosis not present

## 2016-06-08 DIAGNOSIS — R5383 Other fatigue: Secondary | ICD-10-CM | POA: Diagnosis not present

## 2016-06-08 DIAGNOSIS — R300 Dysuria: Secondary | ICD-10-CM | POA: Diagnosis not present

## 2016-06-08 LAB — POC MICROSCOPIC URINALYSIS (UMFC): Mucus: ABSENT

## 2016-06-08 LAB — POCT URINALYSIS DIP (MANUAL ENTRY)
Bilirubin, UA: NEGATIVE
Glucose, UA: NEGATIVE
Ketones, POC UA: NEGATIVE
Leukocytes, UA: NEGATIVE
Nitrite, UA: NEGATIVE
Protein Ur, POC: NEGATIVE
Spec Grav, UA: 1.015
Urobilinogen, UA: 0.2
pH, UA: 7

## 2016-06-08 MED ORDER — PRENATAL/OMEGA-3/FA/IRON 28-0.8-530 MG PO CAPS: 1.0000 | ORAL_CAPSULE | Freq: Every day | ORAL | 4 refills | Status: DC

## 2016-06-08 NOTE — Progress Notes (Signed)
Subjective:    Patient ID: Brooke Turner, female    DOB: [birth year], 40 y.o.   MRN: [id number] Chief Complaint  Patient presents with  . Other    FMLA paper work   . Fatigue    HPI  Has been extremely fatigued and had trouble waking up in the mornings for work.  Has been having some headaches as well.  Sleep is broken up as if she goes to bed before 11 she wakes up by 4-5 a.m. And then she will fall back to sleep and be in the middle of the rem cycle when she wakes up.  Usu she only needs 6 hrs/slp.  Urine seems darker. Really fatigued during the day possible due to the coreg.  Mood . . . alright. Has been carvedilol 3.125 bid and her bp was low, since the beginning of June Had biometric screening and it was 160/100. She rechecked it during the day. She is worried about going up due to the fatigue.  When she takes a lorazepam she will be out - can't keep her eyes open has been takoing around 10 a.m. And 10 pm.  Started taking otc vit gummies. No gym since May but doing PT 2x/wk at [place] but not much cardio and HR elevated in 90s which is uncomfortable with slight increase in palpitations (though not a lot).  Ortho is helping with back and left pectoral as well as mid or upper back  Drinking plenty of water. Snacking throughout day  - cheerios, does have a piece of fruit for breakfast.   Still occ late nights and the a.m. Fatigue. Has not needed or tried the alprazolam. Did not need the diclofenac.  Using the lorazepam more as she won't go to sleep. Stayed up to 3:A30 sev nights as she jsut couldn't relax - she tried ice, mediate, she has noticed that if she takes 1/2 tab she will often need the other 12/ and hr or two later.  If her BP is elevated she will take a loraepman to bring it down but it makes her nervous when the 1/2 doesn't work fast enough. But the whole one will bring her down.  BP has been 101/60s-70s which she has learned to be ok with but HR will be in the  90s.    [facility] Rodena Goldmann, PhD  Past Medical History:  Diagnosis Date  . Anxiety   . Back pain   . Chest pain    a. 09/2014 Echo: EF 60-65%, no rwma, nl valves;  b. 12/2014 Neg ETT.  . Diabetes mellitus without complication ([place])   . Essential hypertension    a. 01/2016 Renal duplex: no RAS- ? renal cyst (renal u/s 4/17 - no cyst, nl study); b. 02/2016 24 hr BP Monitor: Mean SBP ~ 138 with Max of 198. Highest pressures noted in evening hours following placement of cuff.  . Fibroid uterus   . GERD (gastroesophageal reflux disease)   . Palpitations    a. 09/2014 nl event monitor.   Past Surgical History:  Procedure Laterality Date  . WISDOM TOOTH EXTRACTION     Current Outpatient Prescriptions on File Prior to Visit  Medication Sig Dispense Refill  . acetaminophen (TYLENOL) 500 MG tablet Take 500 mg by mouth every 6 (six) hours as needed for moderate pain.    . Albuterol Sulfate (PROAIR RESPICLICK) 768 (90 Base) MCG/ACT AEPB Inhale 2 puffs into the lungs every 4 (four) hours as needed. 1  each 2  . ALPRAZolam (NIRAVAM) 0.25 MG dissolvable tablet Take 1 tablet (0.25 mg total) by mouth 2 (two) times daily as needed for anxiety. 30 tablet 1  . blood glucose meter kit and supplies Test blood sugar twice daily. Dx code: R73.09 1 each 0  . carvedilol (COREG) 3.125 MG tablet TAKE ONE TABLET BY MOUTH TWICE DAILY WITH A MEAL 180 tablet 1  . fluticasone (FLONASE) 50 MCG/ACT nasal spray Place 1 spray into both nostrils daily as needed for allergies.     Marland Kitchen glucose blood test strip Test blood sugar twice daily. Dx code: R73.09 200 each 3  . LORazepam (ATIVAN) 0.5 MG tablet TAKE ONE TABLET BY MOUTH EVERY 6 HOURS AS NEEDED FOR ANXIETY 30 tablet 0  . Norethindrone-Ethinyl Estradiol-Fe Biphas (LO LOESTRIN FE) 1 MG-10 MCG / 10 MCG tablet Take 1 tablet by mouth daily.    [name] DELICA LANCETS 19E MISC Test blood sugar twice daily. Dx code: R73.09 200 each 3  . Propylene Glycol  (SYSTANE BALANCE) 0.6 % SOLN Apply 1 drop to eye daily as needed (for dry eyes).     . ranitidine (ZANTAC) 150 MG tablet TAKE ONE TABLET BY MOUTH TWICE DAILY 60 tablet 1  . [DISCONTINUED] diltiazem (CARDIZEM CD) 120 MG 24 hr capsule Take 1 capsule (120 mg total) by mouth daily. 30 capsule 11   No current facility-administered medications on file prior to visit.    Allergies  Allergen Reactions  . Penicillins Hives    Has patient had a PCN reaction causing immediate rash, facial/tongue/throat swelling, SOB or lightheadedness with hypotension:Yes Has patient had a PCN reaction causing severe rash involving mucus membranes or skin necrosis:unsure Has patient had a PCN reaction that required hospitalization: Yes Has patient had a PCN reaction occurring within the last 10 years: No If all of the above answers are "NO", then may proceed with Cephalosporin use.     . Diltiazem     Dizzy, felt like she was going to pass out Blood pressure went up per patient   . Tamiflu [Oseltamivir Phosphate] Nausea And Vomiting    Excessive vomiting  . Tinidazole     Caused severe abd pain/type of colitis    Family History  Problem Relation Age of Onset  . Seizures Mother   . Cancer Father     Liver  . Diabetes Father 53  . Hypertension Father   . Heart disease Father 6    CEA, LE Stenting  . Alzheimer's disease Maternal Grandmother   . Heart disease Maternal Grandfather    Social History   Social History  . Marital status: Single    Spouse name: N/A  . Number of children: 0  . Years of education: 74   Occupational History  . Customer Service [employer]   Social History Main Topics  . Smoking status: Former Smoker    Packs/day: 0.25    Years: 10.00    Types: Cigarettes    Quit date: 08/19/2014  . Smokeless tobacco: Never Used  . Alcohol use No  . Drug use: No  . Sexual activity: Not Currently    Partners: Male    Birth control/ protection: Pill   Other Topics Concern   . None   Social History Narrative   Patient lives at home alone .   Patient works full time at [employer]   Education college.   Right handed.   Caffeine none     Depression screen [facility] 2/9 06/08/2016 04/12/2016  04/08/2016 02/07/2016 01/24/2016  Decreased Interest 0 0 0 0 0  Down, Depressed, Hopeless 0 1 0 0 0  PHQ - 2 Score 0 1 0 0 0  Some recent data might be hidden     Review of Systems  Constitutional: Positive for fatigue and unexpected weight change (loss). Negative for activity change, appetite change, chills, diaphoresis and fever.  Respiratory: Positive for apnea, chest tightness and shortness of breath. Negative for cough and wheezing.   Cardiovascular: Positive for chest pain and palpitations.  Gastrointestinal: Negative for nausea and vomiting.  Musculoskeletal: Positive for back pain and myalgias. Negative for gait problem.  Neurological: Positive for headaches. Negative for tremors and syncope.  Psychiatric/Behavioral: Positive for agitation, behavioral problems and decreased concentration. Negative for confusion, dysphoric mood and sleep disturbance. The patient is nervous/anxious. The patient is not hyperactive.        Objective:   Physical Exam  Constitutional: She is oriented to person, place, and time. She appears well-developed and well-nourished. No distress.  HENT:  Head: Normocephalic and atraumatic.  Right Ear: External ear normal.  Left Ear: External ear normal.  Eyes: Conjunctivae are normal. No scleral icterus.  Neck: Normal range of motion. Neck supple. No thyromegaly present.  Cardiovascular: Normal rate, regular rhythm, normal heart sounds and intact distal pulses.   Pulmonary/Chest: Effort normal and breath sounds normal. No respiratory distress.  Musculoskeletal: She exhibits no edema.  Lymphadenopathy:    She has no cervical adenopathy.  Neurological: She is alert and oriented to person, place, and time.  Skin: Skin is warm and dry. She is not  diaphoretic. No erythema.  Psychiatric: She has a normal mood and affect. Her behavior is normal.      BP 132/80   Pulse 89   Temp 98.4 F (36.9 C) (Oral)   Resp 17   Ht _0  (1.6 m)   Wt 154 lb (69.9 kg)   LMP  (Within Months)   SpO2 100%   BMI 27.28 kg/m      Assessment & Plan:  Vit D nml 2 mos ago Iron still low - start pnv with iron in it Check pth in another 1-4 mos 1. Dysuria   2. Other fatigue   3. Palpitations - cont on carvedilol  4.      Anxiety - has started seeing a therapist finally! Has increased ativan so may want to consider trial of ssri - failed buspar but may have been secondary to pt's reluctance to increase dose to a therapeutic level - never got past 7.5 bid, don't think she every tried the citalopram 70m rx'ed last yr.  Encouraged pt to try meditating per her therapist rec or try yoga.  Try melatonin to help sleep cycle adjust to more reg time.  FMLA forms completed during OV - update from last yr.    Orders Placed This Encounter  Procedures  . Urine culture  . CBC with Differential/Platelet  . Comprehensive metabolic panel  . Thyroid Panel With TSH  . POCT urinalysis dipstick  . POCT Microscopic Urinalysis ([facility])    Meds ordered this encounter  Medications  . Prenat Vit-Fe Fum-FA-Fish Oil (PRENATAL/OMEGA-3/FA/IRON) 28-0.8-530 MG CAPS    Sig: Take 1 capsule by mouth daily.    Dispense:  90 capsule    Refill:  4    Ok to substitute any vitamin with omega-3, iron, and folic acid in it    Over 40 min spent in face-to-face evaluation of and consultation with patient  and coordination of care.  Over 50% of this time was spent counseling this patient.  [name], M.D.  [facility] [address] [place], [place] [address] [phone] phone [phone] fax  06/21/16 9:32 PM  Results for orders placed or performed in visit on 06/08/16  Urine culture  Result Value Ref Range   Organism ID, Bacteria  Single organism less than 10,000 CFU/mL isolated.    Organism ID, Bacteria These organisms,commonly found on external and    Organism ID, Bacteria internal genitalia,are considered colonizers.    Organism ID, Bacteria No further testing performed.   CBC with Differential/Platelet  Result Value Ref Range   WBC 6.1 3.8 - 10.8 K/uL   RBC 4.57 3.80 - 5.10 MIL/uL   Hemoglobin 13.5 11.7 - 15.5 g/dL   HCT 40.6 35.0 - 45.0 %   MCV 88.8 80.0 - 100.0 fL   MCH 29.5 27.0 - 33.0 pg   MCHC 33.3 32.0 - 36.0 g/dL   RDW 13.3 11.0 - 15.0 %   Platelets 188 140 - 400 K/uL   MPV 8.8 7.5 - 12.5 fL   Neutro Abs 1,281 (L) 1,500 - 7,800 cells/uL   Lymphs Abs 4,026 (H) 850 - 3,900 cells/uL   Monocytes Absolute 610 200 - 950 cells/uL   Eosinophils Absolute 61 15 - 500 cells/uL   Basophils Absolute 122 0 - 200 cells/uL   Neutrophils Relative % 21 %   Lymphocytes Relative 66 %   Monocytes Relative 10 %   Eosinophils Relative 1 %   Basophils Relative 2 %   Smear Review Criteria for review not met   Comprehensive metabolic panel  Result Value Ref Range   Sodium 138 135 - 146 mmol/L   Potassium 4.2 3.5 - 5.3 mmol/L   Chloride 104 98 - 110 mmol/L   CO2 26 20 - 31 mmol/L   Glucose, Bld 85 65 - 99 mg/dL   BUN 8 7 - 25 mg/dL   Creat 1.10 0.50 - 1.10 mg/dL   Total Bilirubin 0.7 0.2 - 1.2 mg/dL   Alkaline Phosphatase 66 33 - 115 U/L   AST 42 (H) 10 - 30 U/L   ALT 31 (H) 6 - 29 U/L   Total Protein 7.1 6.1 - 8.1 g/dL   Albumin 3.9 3.6 - 5.1 g/dL   Calcium 8.8 8.6 - 10.2 mg/dL  Thyroid Panel With TSH  Result Value Ref Range   T4, Total 10.7 4.5 - 12.0 ug/dL   T3 Uptake 30 22 - 35 %   Free Thyroxine Index 3.2 1.4 - 3.8   TSH 0.89 mIU/L  POCT urinalysis dipstick  Result Value Ref Range   Color, UA yellow yellow   Clarity, UA clear clear   Glucose, UA negative negative   Bilirubin, UA negative negative   Ketones, POC UA negative negative   Spec Grav, UA 1.015    Blood, UA trace-intact (A) negative    pH, UA 7.0    Protein Ur, POC negative negative   Urobilinogen, UA 0.2    Nitrite, UA Negative Negative   Leukocytes, UA Negative Negative  POCT Microscopic Urinalysis ([facility])  Result Value Ref Range   WBC,UR,HPF,POC Few (A) None WBC/hpf   RBC,UR,HPF,POC None None RBC/hpf   Bacteria None None, Too numerous to count   Mucus Absent Absent   Epithelial Cells, UR Per Microscopy Few (A) None, Too numerous to count cells/hpf

## 2016-06-08 NOTE — Patient Instructions (Addendum)
  I think you should try meditating by lighting a candle to look at and setting a timer for 5 or 10 minutes and then let your mind go wherever you want.  Give permission to your brain to explore where it wants and see where that leads you.   Try yoga- check [facility] I think is not horribly aggressive - caters to an older cliental so might be a good place to start. They have Gentle Yoga.   Try a melatonin 2-3 mg about 2-3 hours before you want to go to bed - so try taking it at 8 or 9 p.m.   Try the prenatal vitamin I sent to your pharmacy   IF you received an x-ray today, you will receive an invoice from [facility]. Please contact [facility] at [phone] with questions or concerns regarding your invoice.   IF you received labwork today, you will receive an invoice from [employer]. Please contact [facility] at [phone] with questions or concerns regarding your invoice.   Our billing staff will not be able to assist you with questions regarding bills from these companies.  You will be contacted with the lab results as soon as they are available. The fastest way to get your results is to activate your My Chart account. Instructions are located on the last page of this paperwork. If you have not heard from [place] regarding the results in 2 weeks, please contact this office.

## 2016-06-09 LAB — COMPREHENSIVE METABOLIC PANEL
ALT: 31 U/L — ABNORMAL HIGH (ref 6–29)
AST: 42 U/L — ABNORMAL HIGH (ref 10–30)
Albumin: 3.9 g/dL (ref 3.6–5.1)
Alkaline Phosphatase: 66 U/L (ref 33–115)
BUN: 8 mg/dL (ref 7–25)
CO2: 26 mmol/L (ref 20–31)
Calcium: 8.8 mg/dL (ref 8.6–10.2)
Chloride: 104 mmol/L (ref 98–110)
Creat: 1.1 mg/dL (ref 0.50–1.10)
Glucose, Bld: 85 mg/dL (ref 65–99)
Potassium: 4.2 mmol/L (ref 3.5–5.3)
Sodium: 138 mmol/L (ref 135–146)
Total Bilirubin: 0.7 mg/dL (ref 0.2–1.2)
Total Protein: 7.1 g/dL (ref 6.1–8.1)

## 2016-06-09 LAB — CBC WITH DIFFERENTIAL/PLATELET
Basophils Absolute: 122 cells/uL (ref 0–200)
Basophils Relative: 2 %
Eosinophils Absolute: 61 cells/uL (ref 15–500)
Eosinophils Relative: 1 %
HCT: 40.6 % (ref 35.0–45.0)
Hemoglobin: 13.5 g/dL (ref 11.7–15.5)
Lymphocytes Relative: 66 %
Lymphs Abs: 4026 cells/uL — ABNORMAL HIGH (ref 850–3900)
MCH: 29.5 pg (ref 27.0–33.0)
MCHC: 33.3 g/dL (ref 32.0–36.0)
MCV: 88.8 fL (ref 80.0–100.0)
MPV: 8.8 fL (ref 7.5–12.5)
Monocytes Absolute: 610 cells/uL (ref 200–950)
Monocytes Relative: 10 %
Neutro Abs: 1281 cells/uL — ABNORMAL LOW (ref 1500–7800)
Neutrophils Relative %: 21 %
Platelets: 188 10*3/uL (ref 140–400)
RBC: 4.57 MIL/uL (ref 3.80–5.10)
RDW: 13.3 % (ref 11.0–15.0)
WBC: 6.1 10*3/uL (ref 3.8–10.8)

## 2016-06-09 LAB — THYROID PANEL WITH TSH
Free Thyroxine Index: 3.2 (ref 1.4–3.8)
T3 Uptake: 30 % (ref 22–35)
T4, Total: 10.7 ug/dL (ref 4.5–12.0)
TSH: 0.89 mIU/L

## 2016-06-10 LAB — URINE CULTURE

## 2016-06-16 ENCOUNTER — Telehealth: Payer: Self-pay

## 2016-06-16 NOTE — Telephone Encounter (Signed)
I completed the renewal of her FMLA forms at her last OV sev wks prior, had copies scanned into chart.

## 2016-06-16 NOTE — Telephone Encounter (Signed)
Patient needs FMLA forms completed by Dr Brigitte Pulse, for her heart issues that we have been giving her FMLA for the past year. I have completed what I could from the notes and highlighted the ares that need to be completed I will place the forms in your box on 06/16/16 if you could please return them to the FMLA/Disability box at the [address] within 5-7 business days. Thank you!

## 2016-06-17 NOTE — Telephone Encounter (Signed)
These are new forms based off her last OV on 06/08/16 the last FMLA forms I have on file are from 2016 so if you did forms at an OV they were not placed in the FMLA/Disability box to be scanned into Epic and I have not clue where they are. These forms were faxed on 06/08/16 after her visit.

## 2016-06-19 ENCOUNTER — Encounter: Payer: Self-pay | Admitting: Family Medicine

## 2016-06-24 ENCOUNTER — Telehealth: Payer: Self-pay

## 2016-06-24 NOTE — Telephone Encounter (Signed)
Patient stated Dr. Brigitte Pulse need to change the dosage on Prenat-Vit-Fe-Fum-FA-Fish Oil. The dosage need to be up to 1 MG. [facility] [address]

## 2016-06-25 NOTE — Telephone Encounter (Signed)
It is about impossible to find exactly what she wants in the system though I am fine with her having whatever type of vitamin she wants - would be [facility] better if the pharmacist could send [place] a request for whatever she wants and then we could authorize it.  Or she can purchase otc.

## 2016-06-27 NOTE — Telephone Encounter (Signed)
Called pt and advised message from provider on their voicemail.  

## 2016-06-28 DIAGNOSIS — Z0271 Encounter for disability determination: Secondary | ICD-10-CM

## 2016-06-28 NOTE — Telephone Encounter (Signed)
I received fax from [facility] and [place] the vitamins.

## 2016-06-30 NOTE — Telephone Encounter (Signed)
Have we completed these forms? °

## 2016-07-01 NOTE — Telephone Encounter (Signed)
I did complete them at the time of her visit and they have been sent to the scanners.

## 2016-07-01 NOTE — Telephone Encounter (Signed)
If you did FMLA forms at her last OV on 06/16/16 then I have not gotten a copy of these and the company is wanting them faxed to them from [place]. The only completed FMLA forms on file for her is from 09/15/2015.  do you know where the updated forms are?

## 2016-07-01 NOTE — Telephone Encounter (Signed)
OK thank you for letting me know.  

## 2016-07-01 NOTE — Telephone Encounter (Signed)
I called Brooke Turner again and looked at her last MyChart email - the requests we are getting for the [facility] forms are duplicates and can be disreguarded.  She will drop off a copy of the forms I completed for her several wks ago

## 2016-07-01 NOTE — Telephone Encounter (Signed)
They were put in the scan file.  I will have Renay drop off a copy for you.

## 2016-07-07 ENCOUNTER — Other Ambulatory Visit: Payer: Self-pay | Admitting: Family Medicine

## 2016-07-18 ENCOUNTER — Other Ambulatory Visit: Payer: Self-pay | Admitting: Family Medicine

## 2016-07-25 ENCOUNTER — Telehealth: Payer: Self-pay

## 2016-07-25 NOTE — Telephone Encounter (Signed)
PATIENT WOULD LIKE TO ASK DR. SHAW IF SHE SHOULD GET THE FLU SHOT THIS YEAR BECAUSE THIS WILL BE THE FIRST TIME SHE HAS GOTTEN IT WHILE TAKING CARVEDILOL 3.125 MG? SOMETIMES THIS MEDICINE CAN MAKE YOU "FEEL WINDED." SHE JUST DOES NOT WANT TO HAVE BAD SIDE EFFECTS. BEST PHONE [phone] (CELL)  Brooke Turner

## 2016-07-27 ENCOUNTER — Telehealth: Payer: Self-pay | Admitting: *Deleted

## 2016-07-27 NOTE — Telephone Encounter (Signed)
Patient notified per Philis Fendt that it is ok to get flu shot.

## 2016-07-28 NOTE — Telephone Encounter (Signed)
Totally fine to get the flu shot and we DEFINITELY do not want her getting the flu so highly recommend it.

## 2016-07-30 NOTE — Telephone Encounter (Signed)
Called pt to advise

## 2016-08-11 ENCOUNTER — Telehealth: Payer: Self-pay | Admitting: *Deleted

## 2016-08-11 ENCOUNTER — Telehealth: Payer: Self-pay

## 2016-08-11 DIAGNOSIS — G5139 Clonic hemifacial spasm, unspecified: Secondary | ICD-10-CM

## 2016-08-11 NOTE — Telephone Encounter (Signed)
I called and LMVM for pt that Dr. Krista Blue did review chart and MRI that you have done 11-2015.  Since new problem would need eval by MD.  I told her that acute eval she would need to go to ED.

## 2016-08-11 NOTE — Telephone Encounter (Signed)
I spoke with pt.  She relayed that she woke up last pm and had palpitations and L facial tightening.  No droopiness.  She stated her L arm felt weird.  She stated was alittle disoriented.  She drove her self to ED, but did not go in.  She stated she had been there is past and they stated she was having panic attack.  She states that she still has the L face tightening today.  Had MRI 11/2015 and normal.  I told her I would forward message to Dr. Krista Blue since new problems.  She may go to ED for evaluation if sx worsening, (l sided sx).

## 2016-08-11 NOTE — Telephone Encounter (Signed)
Chart reviewed, last visit June 23 2015, normal MRI of the brain January 2017,  Agree with nurse triage plan,

## 2016-08-11 NOTE — Telephone Encounter (Signed)
Brooke Turner is a patient of Dr. Brigitte Pulse. She would like to know if she can have a referral to see Dr. Krista Blue at [facility] to rule out a possible mini stroke she might have had  last night 08/10/16. I let her know she would probably need to come in for a OV-she wanted me to put in a message. Please advise at  [phone]

## 2016-08-16 NOTE — Telephone Encounter (Signed)
Attempted to call pt, left VM for pt to call back  

## 2016-08-17 NOTE — Telephone Encounter (Signed)
Patient notified that referral has been placed  

## 2016-08-17 NOTE — Telephone Encounter (Signed)
Placed referral back to Dr. Krista Blue who she has seen previously but this is a new problem so might need new referral.

## 2016-09-15 ENCOUNTER — Institutional Professional Consult (permissible substitution): Payer: 59 | Admitting: Neurology

## 2016-09-20 ENCOUNTER — Encounter: Payer: Self-pay | Admitting: Neurology

## 2016-09-20 ENCOUNTER — Ambulatory Visit (INDEPENDENT_AMBULATORY_CARE_PROVIDER_SITE_OTHER): Payer: 59 | Admitting: Neurology

## 2016-09-20 ENCOUNTER — Telehealth: Payer: Self-pay | Admitting: Neurology

## 2016-09-20 VITALS — BP 143/79 | HR 78 | Ht 63.0 in | Wt 153.8 lb

## 2016-09-20 DIAGNOSIS — R51 Headache: Secondary | ICD-10-CM

## 2016-09-20 DIAGNOSIS — S0990XS Unspecified injury of head, sequela: Secondary | ICD-10-CM

## 2016-09-20 DIAGNOSIS — S0990XA Unspecified injury of head, initial encounter: Secondary | ICD-10-CM | POA: Insufficient documentation

## 2016-09-20 DIAGNOSIS — R202 Paresthesia of skin: Secondary | ICD-10-CM

## 2016-09-20 DIAGNOSIS — R519 Headache, unspecified: Secondary | ICD-10-CM | POA: Insufficient documentation

## 2016-09-20 NOTE — Telephone Encounter (Signed)
FYI-Per Dr. Krista Blue, pt is OK to see Dr. Leta Baptist for EMG to get pt in before end of year.-slb
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
## 2016-09-22 ENCOUNTER — Other Ambulatory Visit: Payer: Self-pay | Admitting: Family Medicine

## 2016-09-23 ENCOUNTER — Ambulatory Visit
Admission: RE | Admit: 2016-09-23 | Discharge: 2016-09-23 | Disposition: A | Discharge status: Home / Self Care | Payer: 59 | Source: Ambulatory Visit | Attending: Neurology | Admitting: Neurology

## 2016-09-23 ENCOUNTER — Other Ambulatory Visit: Payer: Self-pay

## 2016-09-23 DIAGNOSIS — R51 Headache: Principal | ICD-10-CM

## 2016-09-23 DIAGNOSIS — R519 Headache, unspecified: Secondary | ICD-10-CM

## 2016-09-23 DIAGNOSIS — S0990XS Unspecified injury of head, sequela: Secondary | ICD-10-CM

## 2016-09-23 MED ORDER — RANITIDINE HCL 150 MG PO TABS: 150.0000 mg | ORAL_TABLET | Freq: Two times a day (BID) | ORAL | 11 refills | Status: DC

## 2016-09-23 NOTE — Telephone Encounter (Signed)
Discussed with Dr. Brigitte Pulse and she approved 1 yr of Ranitidine without appointment.  IC pt and advised.

## 2016-09-23 NOTE — Telephone Encounter (Signed)
Pt instructed in Aug to return to clinic prior to any further refills.   Called pt and LMOVM for pt to make appt with Dr. Brigitte Pulse and she could refill med.

## 2016-09-23 NOTE — Telephone Encounter (Signed)
Called pt.  LMOVM -pt was instructed in Aug

## 2016-09-26 ENCOUNTER — Ambulatory Visit: Payer: 59

## 2016-09-27 ENCOUNTER — Ambulatory Visit (INDEPENDENT_AMBULATORY_CARE_PROVIDER_SITE_OTHER): Payer: 59 | Admitting: Neurology

## 2016-09-27 DIAGNOSIS — R55 Syncope and collapse: Secondary | ICD-10-CM

## 2016-09-27 DIAGNOSIS — R51 Headache: Secondary | ICD-10-CM

## 2016-09-27 DIAGNOSIS — R519 Headache, unspecified: Secondary | ICD-10-CM

## 2016-09-27 NOTE — Procedures (Signed)
    History:  Brooke Turner is a 40 year old patient with a history of dizziness, headaches, and heart palpitations. The patient has had episodes of increased heart rate and dizzy sensations. The patient is being evaluated for these events.  This is a routine EEG. No skull defects are noted. Medications include albuterol inhaler, alprazolam, Coreg, Flonase, Ativan, and birth control pills. The patient also takes Zantac.   EEG classification: Normal awake  Description of the recording: The background rhythms of this recording consists of a fairly well modulated medium amplitude alpha rhythm of 9 Hz that is reactive to eye opening and closure. As the record progresses, the patient appears to remain in the waking state throughout the recording. Photic stimulation was performed, resulting in a bilateral and symmetric photic driving response. Hyperventilation was not performed, the patient refused as this procedure gave her a panicky feeling. At no time during the recording does there appear to be evidence of spike or spike wave discharges or evidence of focal slowing. EKG monitor shows no evidence of cardiac rhythm abnormalities with a heart rate of 84.  Impression: This is a normal EEG recording in the waking state. No evidence of ictal or interictal discharges are seen.

## 2016-09-30 ENCOUNTER — Other Ambulatory Visit: Payer: Self-pay | Admitting: Family Medicine

## 2016-09-30 NOTE — Telephone Encounter (Signed)
Last RF 7/19 for #30. Last OV to discuss 8/2.

## 2016-10-16 ENCOUNTER — Other Ambulatory Visit: Payer: Self-pay | Admitting: Family Medicine

## 2016-10-22 ENCOUNTER — Other Ambulatory Visit: Payer: Self-pay | Admitting: Family Medicine

## 2016-10-24 NOTE — Telephone Encounter (Signed)
06/2016 last ov and labs  

## 2016-10-25 ENCOUNTER — Ambulatory Visit (INDEPENDENT_AMBULATORY_CARE_PROVIDER_SITE_OTHER): Payer: 59 | Admitting: Family Medicine

## 2016-10-25 VITALS — BP 148/84 | HR 79 | Temp 98.1°F | Resp 18 | Ht 63.0 in | Wt 151.0 lb

## 2016-10-25 DIAGNOSIS — J320 Chronic maxillary sinusitis: Secondary | ICD-10-CM | POA: Diagnosis not present

## 2016-10-25 DIAGNOSIS — R740 Nonspecific elevation of levels of transaminase and lactic acid dehydrogenase [LDH]: Secondary | ICD-10-CM | POA: Diagnosis not present

## 2016-10-25 DIAGNOSIS — R1010 Upper abdominal pain, unspecified: Secondary | ICD-10-CM | POA: Diagnosis not present

## 2016-10-25 DIAGNOSIS — I10 Essential (primary) hypertension: Secondary | ICD-10-CM | POA: Diagnosis not present

## 2016-10-25 DIAGNOSIS — R7401 Elevation of levels of liver transaminase levels: Secondary | ICD-10-CM

## 2016-10-25 LAB — POCT CBC
Granulocyte percent: 3.5 %G — AB (ref 37–80)
HCT, POC: 39.3 % (ref 37.7–47.9)
Hemoglobin: 13.7 g/dL (ref 12.2–16.2)
Lymph, poc: 3.5 — AB (ref 0.6–3.4)
MCH, POC: 30.7 pg (ref 27–31.2)
MCHC: 34.8 g/dL (ref 31.8–35.4)
MCV: 88.2 fL (ref 80–97)
MID (cbc): 0.3 (ref 0–0.9)
MPV: 6.8 fL (ref 0–99.8)
POC Granulocyte: 1.7 — AB (ref 2–6.9)
POC LYMPH PERCENT: 63.7 %L — AB (ref 10–50)
POC MID %: 5.8 %M (ref 0–12)
Platelet Count, POC: 207 10*3/uL (ref 142–424)
RBC: 4.46 M/uL (ref 4.04–5.48)
RDW, POC: 13.1 %
WBC: 5.5 10*3/uL (ref 4.6–10.2)

## 2016-10-25 LAB — POCT URINALYSIS DIP (MANUAL ENTRY)
Bilirubin, UA: NEGATIVE
Glucose, UA: NEGATIVE
Leukocytes, UA: NEGATIVE
Nitrite, UA: NEGATIVE
Protein Ur, POC: NEGATIVE
Spec Grav, UA: 1.025
Urobilinogen, UA: 1
pH, UA: 6

## 2016-10-25 LAB — POC MICROSCOPIC URINALYSIS (UMFC)

## 2016-10-25 MED ORDER — ALBUTEROL SULFATE HFA 108 (90 BASE) MCG/ACT IN AERS: 2.0000 | INHALATION_SPRAY | RESPIRATORY_TRACT | 1 refills | Status: DC | PRN

## 2016-10-25 MED ORDER — CEFDINIR 300 MG PO CAPS: 600.0000 mg | ORAL_CAPSULE | Freq: Every day | ORAL | 0 refills | Status: DC

## 2016-10-25 MED ORDER — PANTOPRAZOLE SODIUM 40 MG PO TBEC: 40.0000 mg | DELAYED_RELEASE_TABLET | Freq: Every day | ORAL | 1 refills | Status: DC

## 2016-10-25 NOTE — Patient Instructions (Addendum)
Start taking the Flonase - prays into both nares every night before bed for the next 2 weeks. Use the nasal saline spray frequently throughout the day. Start Mucinex high-dose 1200 mg twice a day. If your pain worsens or you develop swelling, fever, chills, or blood in your sputum then starting antibiotic.  Start the pantoprazole 30 minutes before meal every day for the next month.  I highly agree with your therapist that you should try taking the lorazepam before bed every night and see if you can sleep better. I think until you are getting more regular sleep there is little chance of your other symptoms improving.  IF you received an x-ray today, you will receive an invoice from [facility]. Please contact [facility] at [phone] with questions or concerns regarding your invoice.   IF you received labwork today, you will receive an invoice from [facility]. Please contact [facility] at [phone] with questions or concerns regarding your invoice.   Our billing staff will not be able to assist you with questions regarding bills from these companies.  You will be contacted with the lab results as soon as they are available. The fastest way to get your results is to activate your My Chart account. Instructions are located on the last page of this paperwork. If you have not heard from [place] regarding the results in 2 weeks, please contact this office.     Hiatal Hernia A hiatal hernia occurs when part of your stomach slides above the muscle that separates your abdomen from your chest (diaphragm). You can be born with a hiatal hernia (congenital), or it may develop over time. In almost all cases of hiatal hernia, only the top part of the stomach pushes through.  Many people have a hiatal hernia with no symptoms. The larger the hernia, the more likely that you will have symptoms. In some cases, a hiatal hernia allows stomach acid to flow back into the tube that carries food from your  mouth to your stomach (esophagus). This may cause heartburn symptoms. Severe heartburn symptoms may mean you have developed a condition called gastroesophageal reflux disease (GERD).  CAUSES  Hiatal hernias are caused by a weakness in the opening (hiatus) where your esophagus passes through your diaphragm to attach to the upper part of your stomach. You may be born with a weakness in your hiatus, or a weakness can develop. RISK FACTORS Older age is a major risk factor for a hiatal hernia. Anything that increases pressure on your diaphragm can also increase your risk of a hiatal hernia. This includes:  Pregnancy.  Excess weight.  Frequent constipation. SIGNS AND SYMPTOMS  People with a hiatal hernia often have no symptoms. If symptoms develop, they are almost always caused by GERD. They may include:  Heartburn.  Belching.  Indigestion.  Trouble swallowing.  Coughing or wheezing.  Sore throat.  Hoarseness.  Chest pain. DIAGNOSIS  A hiatal hernia is sometimes found during an exam for another problem. Your health care provider may suspect a hiatal hernia if you have symptoms of GERD. Tests may be done to diagnose GERD. These may include:  X-rays of your stomach or chest.  An upper gastrointestinal (GI) series. This is an X-ray exam of your GI tract involving the use of a chalky liquid that you swallow. The liquid shows up clearly on the X-ray.  Endoscopy. This is a procedure to look into your stomach using a thin, flexible tube that has a tiny camera and light on the  end of it. TREATMENT  If you have no symptoms, you may not need treatment. If you have symptoms, treatment may include:  Dietary and lifestyle changes to help reduce GERD symptoms.  Medicines. These may include:  Over-the-counter antacids.  Medicines that make your stomach empty more quickly.  Medicines that block the production of stomach acid (H2 blockers).  Stronger medicines to reduce stomach acid  (proton pump inhibitors).  You may need surgery to repair the hernia if other treatments are not helping. HOME CARE INSTRUCTIONS   Take all medicines as directed by your health care provider.  Quit smoking, if you smoke.  Try to achieve and maintain a healthy body weight.  Eat frequent small meals instead of three large meals a day. This keeps your stomach from getting too full.  Eat slowly.  Do not lie down right after eating.  Do noteat 1-2 hours before bed.   Do not drink beverages with caffeine. These include cola, coffee, cocoa, and tea.  Do not drink alcohol.  Avoid foods that can make symptoms of GERD worse. These may include:  Fatty foods.  Citrus fruits.  Other foods and drinks that contain acid.  Avoid putting pressure on your belly. Anything that puts pressure on your belly increases the amount of acid that may be pushed up into your esophagus.   Avoid bending over, especially after eating.  Raise the head of your bed by putting blocks under the legs. This keeps your head and esophagus higher than your stomach.  Do not wear tight clothing around your chest or stomach.  Try not to strain when having a bowel movement, when urinating, or when lifting heavy objects. SEEK MEDICAL CARE IF:  Your symptoms are not controlled with medicines or lifestyle changes.  You are having trouble swallowing.  You have coughing or wheezing that will not go away. SEEK IMMEDIATE MEDICAL CARE IF:  Your pain is getting worse.  Your pain spreads to your arms, neck, jaw, teeth, or back.  You have shortness of breath.  You sweat for no reason.  You feel sick to your stomach (nauseous) or vomit.  You vomit blood.  You have bright red blood in your stools.  You have black, tarry stools.  This information is not intended to replace advice given to you by your health care provider. Make sure you discuss any questions you have with your health care provider. Document  Released: 01/14/2004 Document Revised: 02/15/2016 Document Reviewed: 10/11/2013 Elsevier Interactive Patient Education  2017 [employer].
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
## 2016-10-26 LAB — CBC WITH DIFFERENTIAL/PLATELET
Basophils Absolute: 0 10*3/uL (ref 0.0–0.2)
Basos: 0 %
EOS (ABSOLUTE): 0 10*3/uL (ref 0.0–0.4)
Eos: 0 %
Hematocrit: 40 % (ref 34.0–46.6)
Hemoglobin: 13.7 g/dL (ref 11.1–15.9)
Immature Grans (Abs): 0 10*3/uL (ref 0.0–0.1)
Immature Granulocytes: 0 %
Lymphocytes Absolute: 3.4 10*3/uL — ABNORMAL HIGH (ref 0.7–3.1)
Lymphs: 63 %
MCH: 30.2 pg (ref 26.6–33.0)
MCHC: 34.3 g/dL (ref 31.5–35.7)
MCV: 88 fL (ref 79–97)
Monocytes Absolute: 0.5 10*3/uL (ref 0.1–0.9)
Monocytes: 9 %
Neutrophils Absolute: 1.6 10*3/uL (ref 1.4–7.0)
Neutrophils: 28 %
Platelets: 231 10*3/uL (ref 150–379)
RBC: 4.54 x10E6/uL (ref 3.77–5.28)
RDW: 14.1 % (ref 12.3–15.4)
WBC: 5.5 10*3/uL (ref 3.4–10.8)

## 2016-10-26 LAB — LIPASE: Lipase: 37 U/L (ref 14–72)

## 2016-10-26 LAB — COMPREHENSIVE METABOLIC PANEL
ALT: 14 IU/L (ref 0–32)
AST: 17 IU/L (ref 0–40)
Albumin/Globulin Ratio: 1.3 (ref 1.2–2.2)
Albumin: 4.1 g/dL (ref 3.5–5.5)
Alkaline Phosphatase: 43 IU/L (ref 39–117)
BUN/Creatinine Ratio: 11 (ref 9–23)
BUN: 12 mg/dL (ref 6–24)
Bilirubin Total: 1.3 mg/dL — ABNORMAL HIGH (ref 0.0–1.2)
CO2: 22 mmol/L (ref 18–29)
Calcium: 8.8 mg/dL (ref 8.7–10.2)
Chloride: 102 mmol/L (ref 96–106)
Creatinine, Ser: 1.07 mg/dL — ABNORMAL HIGH (ref 0.57–1.00)
GFR calc Af Amer: 75 mL/min/{1.73_m2} (ref >59)
GFR calc non Af Amer: 65 mL/min/{1.73_m2} (ref >59)
Globulin, Total: 3.2 g/dL (ref 1.5–4.5)
Glucose: 89 mg/dL (ref 65–99)
Potassium: 4.4 mmol/L (ref 3.5–5.2)
Sodium: 141 mmol/L (ref 134–144)
Total Protein: 7.3 g/dL (ref 6.0–8.5)

## 2016-10-26 LAB — LIPID PANEL
Chol/HDL Ratio: 3.5 ratio units (ref 0.0–4.4)
Cholesterol, Total: 172 mg/dL (ref 100–199)
HDL: 49 mg/dL (ref >39)
LDL Calculated: 106 mg/dL — ABNORMAL HIGH (ref 0–99)
Triglycerides: 87 mg/dL (ref 0–149)
VLDL Cholesterol Cal: 17 mg/dL (ref 5–40)

## 2016-10-26 LAB — LDL CHOLESTEROL, DIRECT: LDL Direct: 101 mg/dL — ABNORMAL HIGH (ref 0–99)

## 2016-10-27 ENCOUNTER — Encounter: Payer: 59 | Admitting: Diagnostic Neuroimaging

## 2016-11-14 DIAGNOSIS — M546 Pain in thoracic spine: Secondary | ICD-10-CM | POA: Diagnosis not present

## 2016-11-14 DIAGNOSIS — M542 Cervicalgia: Secondary | ICD-10-CM | POA: Diagnosis not present

## 2016-11-15 DIAGNOSIS — M546 Pain in thoracic spine: Secondary | ICD-10-CM | POA: Diagnosis not present

## 2016-11-15 DIAGNOSIS — M542 Cervicalgia: Secondary | ICD-10-CM | POA: Diagnosis not present

## 2016-11-18 ENCOUNTER — Ambulatory Visit
Admission: RE | Admit: 2016-11-18 | Discharge: 2016-11-18 | Disposition: A | Discharge status: Home / Self Care | Payer: 59 | Source: Ambulatory Visit | Attending: Family Medicine | Admitting: Family Medicine

## 2016-11-18 DIAGNOSIS — R7401 Elevation of levels of liver transaminase levels: Secondary | ICD-10-CM

## 2016-11-18 DIAGNOSIS — R1010 Upper abdominal pain, unspecified: Secondary | ICD-10-CM

## 2016-11-18 DIAGNOSIS — R740 Nonspecific elevation of levels of transaminase and lactic acid dehydrogenase [LDH]: Secondary | ICD-10-CM

## 2016-11-21 DIAGNOSIS — M546 Pain in thoracic spine: Secondary | ICD-10-CM | POA: Diagnosis not present

## 2016-11-21 DIAGNOSIS — M542 Cervicalgia: Secondary | ICD-10-CM | POA: Diagnosis not present

## 2016-11-24 DIAGNOSIS — I10 Essential (primary) hypertension: Secondary | ICD-10-CM | POA: Diagnosis not present

## 2016-11-24 DIAGNOSIS — R05 Cough: Secondary | ICD-10-CM | POA: Diagnosis not present

## 2016-11-27 DIAGNOSIS — M5136 Other intervertebral disc degeneration, lumbar region: Secondary | ICD-10-CM | POA: Diagnosis not present

## 2016-11-30 DIAGNOSIS — M542 Cervicalgia: Secondary | ICD-10-CM | POA: Diagnosis not present

## 2016-11-30 DIAGNOSIS — M546 Pain in thoracic spine: Secondary | ICD-10-CM | POA: Diagnosis not present

## 2016-12-15 ENCOUNTER — Encounter: Payer: Self-pay | Admitting: Diagnostic Neuroimaging

## 2016-12-28 ENCOUNTER — Inpatient Hospital Stay (HOSPITAL_COMMUNITY)
Admission: AD | Admit: 2016-12-28 | Discharge: 2016-12-29 | Disposition: A | Discharge status: Home / Self Care | Payer: 59 | Source: Ambulatory Visit | Attending: Obstetrics and Gynecology | Admitting: Obstetrics and Gynecology

## 2016-12-28 ENCOUNTER — Encounter (HOSPITAL_COMMUNITY): Payer: Self-pay

## 2016-12-28 DIAGNOSIS — Z87891 Personal history of nicotine dependence: Secondary | ICD-10-CM | POA: Insufficient documentation

## 2016-12-28 DIAGNOSIS — I10 Essential (primary) hypertension: Secondary | ICD-10-CM | POA: Diagnosis not present

## 2016-12-28 DIAGNOSIS — D259 Leiomyoma of uterus, unspecified: Secondary | ICD-10-CM | POA: Diagnosis not present

## 2016-12-28 DIAGNOSIS — Z880 Allergy status to penicillin: Secondary | ICD-10-CM | POA: Diagnosis not present

## 2016-12-28 DIAGNOSIS — N939 Abnormal uterine and vaginal bleeding, unspecified: Secondary | ICD-10-CM | POA: Diagnosis not present

## 2016-12-28 DIAGNOSIS — M5136 Other intervertebral disc degeneration, lumbar region: Secondary | ICD-10-CM | POA: Diagnosis not present

## 2016-12-28 LAB — URINALYSIS, ROUTINE W REFLEX MICROSCOPIC
Bilirubin Urine: NEGATIVE
Glucose, UA: NEGATIVE mg/dL
Ketones, ur: NEGATIVE mg/dL
Leukocytes, UA: NEGATIVE
Nitrite: NEGATIVE
Protein, ur: NEGATIVE mg/dL
Specific Gravity, Urine: 1.014 (ref 1.005–1.030)
pH: 5 (ref 5.0–8.0)

## 2016-12-28 LAB — CBC
HCT: 34.7 % — ABNORMAL LOW (ref 36.0–46.0)
Hemoglobin: 12.4 g/dL (ref 12.0–15.0)
MCH: 31.2 pg (ref 26.0–34.0)
MCHC: 35.7 g/dL (ref 30.0–36.0)
MCV: 87.2 fL (ref 78.0–100.0)
Platelets: 184 10*3/uL (ref 150–400)
RBC: 3.98 MIL/uL (ref 3.87–5.11)
RDW: 12.7 % (ref 11.5–15.5)
WBC: 5.5 10*3/uL (ref 4.0–10.5)

## 2016-12-28 LAB — POCT PREGNANCY, URINE: Preg Test, Ur: NEGATIVE

## 2016-12-28 MED ORDER — PROGESTERONE MICRONIZED 200 MG PO CAPS
200.0000 mg | ORAL_CAPSULE | Freq: Every day | ORAL | Status: DC
Start: 2016-12-29 — End: 2016-12-29

## 2016-12-28 NOTE — MAU Note (Signed)
Patient presents with c/o vaginal bleeding for the past 10 days. Patient states that she is on birth control and has irregular bleeding since October. Patient states that she has already had a menstrual cycle this month and now has started bleeding again. Patient has gone through 2 tampons today.

## 2016-12-29 DIAGNOSIS — N939 Abnormal uterine and vaginal bleeding, unspecified: Secondary | ICD-10-CM | POA: Diagnosis not present

## 2016-12-29 DIAGNOSIS — D259 Leiomyoma of uterus, unspecified: Secondary | ICD-10-CM | POA: Diagnosis not present

## 2016-12-29 MED ORDER — PROGESTERONE MICRONIZED 200 MG PO CAPS: 200.0000 mg | ORAL_CAPSULE | Freq: Every day | ORAL | 0 refills | Status: DC

## 2016-12-29 MED ORDER — PROGESTERONE MICRONIZED 200 MG PO CAPS
200.0000 mg | ORAL_CAPSULE | Freq: Once | ORAL | Status: AC
  Administered 2016-12-29: 200 mg via ORAL
  Filled 2016-12-29: qty 1

## 2016-12-29 NOTE — Discharge Instructions (Signed)
Abnormal Uterine Bleeding Abnormal uterine bleeding can affect women at various stages in life, including teenagers, women in their reproductive years, pregnant women, and women who have reached menopause. Several kinds of uterine bleeding are considered abnormal, including:  Bleeding or spotting between periods.  Bleeding after sexual intercourse.  Bleeding that is heavier or more than normal.  Periods that last longer than usual.  Bleeding after menopause. Many cases of abnormal uterine bleeding are minor and simple to treat, while others are more serious. Any type of abnormal bleeding should be evaluated by your health care provider. Treatment will depend on the cause of the bleeding. Follow these instructions at home: Monitor your condition for any changes. The following actions may help to alleviate any discomfort you are experiencing:  Avoid the use of tampons and douches as directed by your health care provider.  Change your pads frequently. You should get regular pelvic exams and Pap tests. Keep all follow-up appointments for diagnostic tests as directed by your health care provider. Contact a health care provider if:  Your bleeding lasts more than 1 week.  You feel dizzy at times. Get help right away if:  You pass out.  You are changing pads every 15 to 30 minutes.  You have abdominal pain.  You have a fever.  You become sweaty or weak.  You are passing large blood clots from the vagina.  You start to feel nauseous and vomit. This information is not intended to replace advice given to you by your health care provider. Make sure you discuss any questions you have with your health care provider. Document Released: 10/24/2005 Document Revised: 04/06/2016 Document Reviewed: 05/23/2013 Elsevier Interactive Patient Education  2017 [employer]  

## 2016-12-29 NOTE — MAU Provider Note (Signed)
Chief Complaint: Vaginal Bleeding   First Provider Initiated Contact with Patient 12/28/16 2235     SUBJECTIVE HPI: Brooke Turner is a 41 y.o. [id number] female who presents to [facility] reporting vaginal bleeding 10 days, sometimes heavy and with clots. Has previously been diagnosed with fibroids and was put on Loestrin FE to help with bleeding but has had a few heavier episodes of bleeding since October 2017. Call physicians for women about these heavy bleeding episodes. They recommended that she then OCP taper. She didn't do it initially because the bleeding got better, but the next time she had a bleeding she did the taper. Bleeding improved for the first 6 days, but became heavy again when she went down to 1 pill per day. Patient is extremely anxious because she states her sister had fibroids and hemorrhaged and had to have emergency surgery.  Associated signs and symptoms: Negative for abdominal pain or dizziness.  Past Medical History:  Diagnosis Date  . Anxiety   . Back pain   . Chest pain    a. 09/2014 Echo: EF 60-65%, no rwma, nl valves;  b. 12/2014 Neg ETT.  . Diabetes mellitus without complication ([place])   . Essential hypertension    a. 01/2016 Renal duplex: no RAS- ? renal cyst (renal u/s 4/17 - no cyst, nl study); b. 02/2016 24 hr BP Monitor: Mean SBP ~ 138 with Max of 198. Highest pressures noted in evening hours following placement of cuff.  . Fibroid uterus   . GERD (gastroesophageal reflux disease)   . Palpitations    a. 09/2014 nl event monitor.   OB History  Gravida Para Term Preterm AB Living  2 0 0 0 2 0  SAB TAB Ectopic Multiple Live Births  1 1 0 0      # Outcome Date GA Lbr Len/2nd Weight Sex Delivery Anes PTL Lv  2 SAB           1 TAB              Past Surgical History:  Procedure Laterality Date  . WISDOM TOOTH EXTRACTION     Social History   Social History  . Marital status: Single    Spouse name: N/A  . Number of children: 0  .  Years of education: 89   Occupational History  . Customer Service [employer]   Social History Main Topics  . Smoking status: Former Smoker    Packs/day: 0.25    Years: 10.00    Types: Cigarettes    Quit date: 08/19/2014  . Smokeless tobacco: Never Used  . Alcohol use No  . Drug use: No  . Sexual activity: Not Currently    Partners: Male    Birth control/ protection: Pill   Other Topics Concern  . Not on file   Social History Narrative   Patient lives at home alone .   Patient works full time at [employer]   Education college.   Right handed.   Caffeine none   Family History  Problem Relation Age of Onset  . Seizures Mother   . Cancer Father     Liver  . Diabetes Father 23  . Hypertension Father   . Heart disease Father 72    CEA, LE [place]  . Alzheimer's disease Maternal Grandmother   . Heart disease Maternal Grandfather    No current facility-administered medications on file prior to encounter.    Current Outpatient Prescriptions on File Prior to Encounter  Medication Sig Dispense Refill  . carvedilol (COREG) 3.125 MG tablet TAKE ONE TABLET BY MOUTH TWICE DAILY WITH MEALS 180 tablet 0  . LORazepam (ATIVAN) 0.5 MG tablet TAKE ONE TABLET BY MOUTH EVERY 6 HOURS AS NEEDED FOR ANXIETY 30 tablet 0  . Norethindrone-Ethinyl Estradiol-Fe Biphas (LO LOESTRIN FE) 1 MG-10 MCG / 10 MCG tablet Take 1 tablet by mouth daily.    . ranitidine (ZANTAC) 150 MG tablet Take 1 tablet (150 mg total) by mouth 2 (two) times daily. 60 tablet 11  . acetaminophen (TYLENOL) 500 MG tablet Take 500 mg by mouth every 6 (six) hours as needed for moderate pain.    Marland Kitchen albuterol (PROVENTIL HFA;VENTOLIN HFA) 108 (90 Base) MCG/ACT inhaler Inhale 2 puffs into the lungs every 4 (four) hours as needed for wheezing or shortness of breath (cough, shortness of breath or wheezing.). 1 Inhaler 1  . ALPRAZolam (NIRAVAM) 0.25 MG dissolvable tablet Take 1 tablet (0.25 mg total) by mouth 2 (two) times daily  as needed for anxiety. (Patient not taking: Reported on 12/28/2016) 30 tablet 1  . blood glucose meter kit and supplies Test blood sugar twice daily. Dx code: R73.09 1 each 0  . cefdinir (OMNICEF) 300 MG capsule Take 2 capsules (600 mg total) by mouth daily. (Patient not taking: Reported on 12/28/2016) 14 capsule 0  . fluticasone (FLONASE) 50 MCG/ACT nasal spray Place 1 spray into both nostrils daily as needed for allergies.     [name] DELICA LANCETS 74B MISC USE TO TEST BLOOD SUGAR DAILY 100 each 0  . ONETOUCH VERIO test strip TEST BLOOD SUGAR DAILY 25 each 10  . pantoprazole (PROTONIX) 40 MG tablet Take 1 tablet (40 mg total) by mouth daily. (Patient not taking: Reported on 12/28/2016) 30 tablet 1  . Prenat Vit-Fe Fum-FA-Fish Oil (PRENATAL/OMEGA-3/FA/IRON) 28-0.8-530 MG CAPS Take 1 capsule by mouth daily. (Patient not taking: Reported on 12/28/2016) 90 capsule 4  . Propylene Glycol (SYSTANE BALANCE) 0.6 % SOLN Apply 1 drop to eye daily as needed (for dry eyes).     . [DISCONTINUED] diltiazem (CARDIZEM CD) 120 MG 24 hr capsule Take 1 capsule (120 mg total) by mouth daily. 30 capsule 11   Allergies  Allergen Reactions  . Penicillins Hives    Has patient had a PCN reaction causing immediate rash, facial/tongue/throat swelling, SOB or lightheadedness with hypotension:Yes Has patient had a PCN reaction causing severe rash involving mucus membranes or skin necrosis:unsure Has patient had a PCN reaction that required hospitalization: Yes Has patient had a PCN reaction occurring within the last 10 years: No If all of the above answers are "NO", then may proceed with Cephalosporin use.     . Diltiazem     Dizzy, felt like she was going to pass out Blood pressure went up per patient   . Tamiflu [Oseltamivir Phosphate] Nausea And Vomiting    Excessive vomiting  . Tinidazole     Caused severe abd pain/type of colitis     I have reviewed patient's Past Medical Hx, Surgical Hx, Family Hx, Social  Hx, medications and allergies.   Review of Systems  Constitutional: Negative for chills, fatigue and fever.  Gastrointestinal: Negative for abdominal pain.  Genitourinary: Positive for vaginal bleeding. Negative for vaginal discharge.  Neurological: Negative for dizziness, weakness and light-headedness.  Hematological: Does not bruise/bleed easily.    OBJECTIVE Patient Vitals for the past 24 hrs:  BP Temp Temp src Pulse Resp  12/28/16 2200 135/87 - - 79 -  12/28/16 2145 138/79 - - 83 -  12/28/16 2113 171/85 - - 115 -  12/28/16 2108 (!) 168/104 98.3 F (36.8 C) Oral (!) 121 18   Constitutional: Well-developed, well-nourished female in no acute distress.  Psych: Extremely anxious initially Cardiovascular: Tachycardic initially, improved spontaneously Respiratory: normal rate and effort.  GI: Abd soft, non-tender. MS: Extremities nontender, no edema, normal ROM Neurologic: Alert and oriented x 4.  GU: Neg CVAT.  SPECULUM EXAM: NEFG, small and medium amount of bright red blood noted, cervix clean, no polyps  BIMANUAL: cervix closed; uterus slightly enlarged size, no adnexal tenderness or masses. No CMT.  LAB RESULTS Results for orders placed or performed during the hospital encounter of 12/28/16 (from the past 24 hour(s))  Urinalysis, Routine w reflex microscopic     Status: Abnormal   Collection Time: 12/28/16  8:50 PM  Result Value Ref Range   Color, Urine YELLOW YELLOW   APPearance CLEAR CLEAR   Specific Gravity, Urine 1.014 1.005 - 1.030   pH 5.0 5.0 - 8.0   Glucose, UA NEGATIVE NEGATIVE mg/dL   Hgb urine dipstick MODERATE (A) NEGATIVE   Bilirubin Urine NEGATIVE NEGATIVE   Ketones, ur NEGATIVE NEGATIVE mg/dL   Protein, ur NEGATIVE NEGATIVE mg/dL   Nitrite NEGATIVE NEGATIVE   Leukocytes, UA NEGATIVE NEGATIVE   RBC / HPF 6-30 0 - 5 RBC/hpf   WBC, UA 0-5 0 - 5 WBC/hpf   Bacteria, UA RARE (A) NONE SEEN   Squamous Epithelial / LPF 0-5 (A) NONE SEEN   Mucous PRESENT     Hyaline Casts, UA PRESENT   Pregnancy, urine POC     Status: None   Collection Time: 12/28/16  9:14 PM  Result Value Ref Range   Preg Test, Ur NEGATIVE NEGATIVE  CBC     Status: Abnormal   Collection Time: 12/28/16  9:38 PM  Result Value Ref Range   WBC 5.5 4.0 - 10.5 K/uL   RBC 3.98 3.87 - 5.11 MIL/uL   Hemoglobin 12.4 12.0 - 15.0 g/dL   HCT 34.7 (L) 36.0 - 46.0 %   MCV 87.2 78.0 - 100.0 fL   MCH 31.2 26.0 - 34.0 pg   MCHC 35.7 30.0 - 36.0 g/dL   RDW 12.7 11.5 - 15.5 %   Platelets 184 150 - 400 K/uL    IMAGING No results found.  MAU COURSE Orders Placed This Encounter  Procedures  . Urinalysis, Routine w reflex microscopic  . CBC  . Vital signs  . Pregnancy, urine POC  . ED EKG  . EKG 12-Lead  . Discharge patient   Meds ordered this encounter  Medications  . Prenatal MV-Min-FA-Omega-3 (PRENATAL GUMMIES/DHA & FA) 0.4-32.5 MG CHEW    Sig: Chew 2 each by mouth daily.  . progesterone (PROMETRIUM) capsule 200 mg  . progesterone (PROMETRIUM) 200 MG capsule    Sig: Take 1 capsule (200 mg total) by mouth at bedtime.    Dispense:  10 capsule    Refill:  0    Order Specific Question:   Supervising Provider    Answer:   Tania Ade H [[id number]]    MDM - Abnormal uterine bleeding most likely due to fibroids. Not anemic. Bleeding stable. We'll treat with Prometrium per consult with Dr. Gertie Fey and have patient follow-up in the office in the next week to 10 days to discuss management.  ASSESSMENT 1. Abnormal uterine bleeding   2. Uterine leiomyoma, unspecified location   3. Essential hypertension  PLAN Discharge home in stable condition. Bleeding precautions Follow-up Information    [facility] Follow up in 1 week(s).   Why:  for further evaluation and management of bleeding Contact information: [address] [place] [address] [phone]        [facility] Follow up.   Why:   in emergencies Contact information: [address] [id number] [place] [phone]         Allergies as of 12/29/2016      Reactions   Penicillins Hives   Has patient had a PCN reaction causing immediate rash, facial/tongue/throat swelling, SOB or lightheadedness with hypotension:Yes Has patient had a PCN reaction causing severe rash involving mucus membranes or skin necrosis:unsure Has patient had a PCN reaction that required hospitalization: Yes Has patient had a PCN reaction occurring within the last 10 years: No If all of the above answers are "NO", then may proceed with Cephalosporin use.   Diltiazem    Dizzy, felt like she was going to pass out Blood pressure went up per patient    Tamiflu [oseltamivir Phosphate] Nausea And Vomiting   Excessive vomiting   Tinidazole    Caused severe abd pain/type of colitis      Medication List    STOP taking these medications   ALPRAZolam 0.25 MG dissolvable tablet Commonly known as:  NIRAVAM   cefdinir 300 MG capsule Commonly known as:  OMNICEF   pantoprazole 40 MG tablet Commonly known as:  PROTONIX   PRENATAL/OMEGA-3/FA/IRON 28-0.8-530 MG Caps     TAKE these medications   acetaminophen 500 MG tablet Commonly known as:  TYLENOL Take 500 mg by mouth every 6 (six) hours as needed for moderate pain.   albuterol 108 (90 Base) MCG/ACT inhaler Commonly known as:  PROVENTIL HFA;VENTOLIN HFA Inhale 2 puffs into the lungs every 4 (four) hours as needed for wheezing or shortness of breath (cough, shortness of breath or wheezing.).   blood glucose meter kit and supplies Test blood sugar twice daily. Dx code: R73.09   carvedilol 3.125 MG tablet Commonly known as:  COREG TAKE ONE TABLET BY MOUTH TWICE DAILY WITH MEALS   fluticasone 50 MCG/ACT nasal spray Commonly known as:  FLONASE Place 1 spray into both nostrils daily as needed for allergies.   LORazepam 0.5 MG tablet Commonly known as:   ATIVAN TAKE ONE TABLET BY MOUTH EVERY 6 HOURS AS NEEDED FOR ANXIETY   Norethindrone-Ethinyl Estradiol-Fe Biphas 1 MG-10 MCG / 10 MCG tablet Commonly known as:  LO LOESTRIN FE Take 1 tablet by mouth daily.   ONETOUCH DELICA LANCETS 73A Misc USE TO TEST BLOOD SUGAR DAILY   ONETOUCH VERIO test strip Generic drug:  glucose blood TEST BLOOD SUGAR DAILY   PRENATAL GUMMIES/DHA & FA 0.4-32.5 MG Chew Chew 2 each by mouth daily.   progesterone 200 MG capsule Commonly known as:  PROMETRIUM Take 1 capsule (200 mg total) by mouth at bedtime.   ranitidine 150 MG tablet Commonly known as:  ZANTAC Take 1 tablet (150 mg total) by mouth 2 (two) times daily.   SYSTANE BALANCE 0.6 % Soln Generic drug:  Propylene Glycol Apply 1 drop to eye daily as needed (for dry eyes).        Luck, [place] 12/29/2016  12:45 AM

## 2016-12-30 DIAGNOSIS — N924 Excessive bleeding in the premenopausal period: Secondary | ICD-10-CM | POA: Diagnosis not present

## 2017-01-02 ENCOUNTER — Ambulatory Visit: Payer: 59 | Admitting: Nurse Practitioner

## 2017-01-05 ENCOUNTER — Ambulatory Visit: Payer: 59 | Admitting: Family Medicine

## 2017-01-07 ENCOUNTER — Ambulatory Visit (INDEPENDENT_AMBULATORY_CARE_PROVIDER_SITE_OTHER): Payer: 59 | Admitting: Family Medicine

## 2017-01-07 VITALS — BP 150/90 | HR 78 | Temp 98.3°F | Resp 17 | Ht 63.0 in | Wt 155.2 lb

## 2017-01-07 DIAGNOSIS — R0602 Shortness of breath: Secondary | ICD-10-CM | POA: Diagnosis not present

## 2017-01-07 DIAGNOSIS — R002 Palpitations: Secondary | ICD-10-CM

## 2017-01-07 DIAGNOSIS — G44229 Chronic tension-type headache, not intractable: Secondary | ICD-10-CM | POA: Diagnosis not present

## 2017-01-07 DIAGNOSIS — I10 Essential (primary) hypertension: Secondary | ICD-10-CM | POA: Diagnosis not present

## 2017-01-07 DIAGNOSIS — F411 Generalized anxiety disorder: Secondary | ICD-10-CM

## 2017-01-07 DIAGNOSIS — R079 Chest pain, unspecified: Secondary | ICD-10-CM

## 2017-01-07 DIAGNOSIS — F418 Other specified anxiety disorders: Secondary | ICD-10-CM

## 2017-01-07 DIAGNOSIS — R42 Dizziness and giddiness: Secondary | ICD-10-CM

## 2017-01-07 MED ORDER — LORAZEPAM 0.5 MG PO TABS: ORAL_TABLET | ORAL | 2 refills | Status: DC

## 2017-01-07 MED ORDER — HYDRALAZINE HCL 10 MG PO TABS: 10.0000 mg | ORAL_TABLET | Freq: Three times a day (TID) | ORAL | 1 refills | Status: DC

## 2017-01-07 NOTE — Progress Notes (Signed)
By signing my name below, I, Mesha Guinyard, attest that this documentation has been prepared under the direction and in the presence of [name], MD.  Electronically Signed: Verlee Monte, Medical Scribe. 01/07/17. 4:22 PM.  Subjective:    Patient ID: Brooke Turner, female    DOB: 1976-09-27, 41 y.o.   MRN: [id number]  HPI Chief Complaint  Patient presents with  . discuss FMLA paperwork    Needs paperwork updaed  . Headache    x 8 days    HPI Comments: Brooke Turner is a 41 y.o. female who presents to the [facility] complaining of FMLA paperwork and lingering HA.  FMLA: She states she could reuse the last FMLA paperwork. Pt would like to decrease the office visit and increase the capacity by the day.  HA: Pt was having menorrhagia for 2 weeks with clotting before she was seen by her OB/GYN. She was told it was from her fibroids growing. She has 5 and they measured about 2.7cm. Pt was put on a progesterone for 10 days for treatment. She noticed that shortly after starting progesterone, her bp has been elevated. Her associated sxs are lingering HA, "foggy" dizziness, seeing floaters (although it's not acute), and bloating right after taking her progesterone. She located her HA in her temporal and frontal region, and it's intensity is occasionally lightly lingering, and other times it's sharp. Pt took 1 ibuprofen without relief of her HA. Denies nausea, and seeing stars.  FHx: sister had fibroids and a hemorrhage from it that caused syncope.  Chest Pain/chest tightness: Pt is still in therapy but she still has some physical sxs that resolve such as chest tightness and chest pain - she no longer has a racing heart. Pt would message the location of her chest wall for relief and she would also go to the ED parking lot for relief. Pt has been taking 0.5 tabs of ativan sporadically PRN for relief of her sxs. While in therapy, she's tried breathing  techniques, and focusing on her body parts to become more aware. Pt stopped exercising in the gym in May 2017 and stopped practicing yoga in December, but plans on going picking it back up.  SOB: Pt reports trouble breathing while walking up the court house step and she had some SOB, and she isn't sure if it's because she's out of shape.  Patient Active Problem List   Diagnosis Date Noted  . Left facial pain 09/20/2016  . Head trauma 09/20/2016  . Palpitations   . Chest pain   . Left sided numbness 06/11/2015  . Chronic tension-type headache, not intractable 06/10/2015  . Dyspnea 03/18/2015  . Post-traumatic stress reaction 02/25/2015  . Anxiety disorder due to general medical condition with panic attack 02/25/2015  . Subserous leiomyoma of uterus 02/25/2015  . Anxiety about health 02/25/2015  . Deviated nasal septum 12/30/2014  . Chronic rhinitis 12/30/2014  . Chronic maxillary sinusitis 12/30/2014  . Snorings 09/30/2014  . Fatigue 09/26/2014  . Dizziness and giddiness 09/11/2014  . Tachycardia 09/11/2014  . Precordial chest pain 09/11/2014  . Prediabetes 08/26/2014  . Acanthosis nigricans 03/19/2014  . Vitamin D deficiency 07/15/2013  . Large breasts 09/12/2012  . Essential hypertension 02/17/2012  . Contraception management 02/17/2012  . Obesity (BMI 30-39.9) previous BMI was over 40. Patient has lost 70 pounds in the last year 02/17/2012  . Anxiety disorder 02/17/2012  . Chronic back pain 02/17/2012   Past Medical History:  Diagnosis  Date  . Anxiety   . Back pain   . Chest pain    a. 09/2014 Echo: EF 60-65%, no rwma, nl valves;  b. 12/2014 Neg ETT.  . Diabetes mellitus without complication ([place])   . Essential hypertension    a. 01/2016 Renal duplex: no RAS- ? renal cyst (renal u/s 4/17 - no cyst, nl study); b. 02/2016 24 hr BP Monitor: Mean SBP ~ 138 with Max of 198. Highest pressures noted in evening hours following placement of cuff.  . Fibroid uterus   . GERD  (gastroesophageal reflux disease)   . Palpitations    a. 09/2014 nl event monitor.   Past Surgical History:  Procedure Laterality Date  . WISDOM TOOTH EXTRACTION     Allergies  Allergen Reactions  . Penicillins Hives    Has patient had a PCN reaction causing immediate rash, facial/tongue/throat swelling, SOB or lightheadedness with hypotension:Yes Has patient had a PCN reaction causing severe rash involving mucus membranes or skin necrosis:unsure Has patient had a PCN reaction that required hospitalization: Yes Has patient had a PCN reaction occurring within the last 10 years: No If all of the above answers are "NO", then may proceed with Cephalosporin use.     . Diltiazem     Dizzy, felt like she was going to pass out Blood pressure went up per patient   . Tamiflu [Oseltamivir Phosphate] Nausea And Vomiting    Excessive vomiting  . Tinidazole     Caused severe abd pain/type of colitis    Prior to Admission medications   Medication Sig Start Date End Date Taking? Authorizing Provider  acetaminophen (TYLENOL) 500 MG tablet Take 500 mg by mouth every 6 (six) hours as needed for moderate pain.    Historical Provider, MD  albuterol (PROVENTIL HFA;VENTOLIN HFA) 108 (90 Base) MCG/ACT inhaler Inhale 2 puffs into the lungs every 4 (four) hours as needed for wheezing or shortness of breath (cough, shortness of breath or wheezing.). 10/25/16   [name], MD  blood glucose meter kit and supplies Test blood sugar twice daily. Dx code: R73.09 07/22/15   [name], MD  carvedilol (COREG) 3.125 MG tablet TAKE ONE TABLET BY MOUTH TWICE DAILY WITH MEALS 10/24/16   [name], MD  fluticasone [facility]) 50 MCG/ACT nasal spray Place 1 spray into both nostrils daily as needed for allergies.     Historical Provider, MD  LORazepam (ATIVAN) 0.5 MG tablet TAKE ONE TABLET BY MOUTH EVERY 6 HOURS AS NEEDED FOR ANXIETY 10/01/16   [name], MD  Norethindrone-Ethinyl Estradiol-Fe Biphas (LO LOESTRIN FE) 1  MG-10 MCG / 10 MCG tablet Take 1 tablet by mouth daily.    Historical Provider, MD  Jonetta Speak LANCETS 37T MISC USE TO TEST BLOOD SUGAR DAILY 10/17/16   [name], MD  [facility] VERIO test strip TEST BLOOD SUGAR DAILY 07/19/16   [name], MD  Prenatal MV-Min-FA-Omega-3 (PRENATAL GUMMIES/DHA & FA) 0.4-32.5 MG CHEW Chew 2 each by mouth daily.    Historical Provider, MD  progesterone (PROMETRIUM) 200 MG capsule Take 1 capsule (200 mg total) by mouth at bedtime. 12/29/16   Manya Silvas, CNM  Propylene Glycol (SYSTANE BALANCE) 0.6 % SOLN Apply 1 drop to eye daily as needed (for dry eyes).     Historical Provider, MD  ranitidine (ZANTAC) 150 MG tablet Take 1 tablet (150 mg total) by mouth 2 (two) times daily. 09/23/16   [name], MD   Social History  Social History  . Marital status: Single    Spouse name: N/A  . Number of children: 0  . Years of education: 43   Occupational History  . Customer Service [employer]   Social History Main Topics  . Smoking status: Former Smoker    Packs/day: 0.25    Years: 10.00    Types: Cigarettes    Quit date: 08/19/2014  . Smokeless tobacco: Never Used  . Alcohol use No  . Drug use: No  . Sexual activity: Not Currently    Partners: Male    Birth control/ protection: Pill   Other Topics Concern  . Not on file   Social History Narrative   Patient lives at home alone .   Patient works full time at [employer]   Education college.   Right handed.   Caffeine none   Depression screen [facility] 2/9 01/07/2017 10/25/2016 06/08/2016 04/12/2016 04/08/2016  Decreased Interest 0 0 0 0 0  Down, Depressed, Hopeless 0 0 0 1 0  PHQ - 2 Score 0 0 0 1 0  Some recent data might be hidden    Review of Systems  Eyes: Negative for visual disturbance.  Gastrointestinal: Positive for abdominal pain. Negative for nausea.  Genitourinary: Positive for menstrual problem and vaginal bleeding.  Neurological: Positive for dizziness and headaches.   Objective:    Physical Exam  Constitutional: She appears well-developed and well-nourished. No distress.  HENT:  Head: Normocephalic and atraumatic.  Eyes: Conjunctivae are normal.  Neck: Neck supple.  Cardiovascular: Normal rate.   Pulmonary/Chest: Effort normal.  Neurological: She is alert.  Skin: Skin is warm and dry.  Psychiatric: She has a normal mood and affect. Her behavior is normal.  Nursing note and vitals reviewed.  BP (!) 150/90   Pulse 78   Temp 98.3 F (36.8 C) (Oral)   Resp 17   Ht 5' 3"  (1.6 m)   Wt 155 lb 4 oz (70.4 kg)   LMP 12/05/2016 (Approximate)   SpO2 100%   BMI 27.50 kg/m  Assessment & Plan:   1. Essential hypertension   2. Palpitations   3. Dizziness and giddiness   4. Shortness of breath   5. Chest pain, unspecified type   6. Generalized anxiety disorder   7. Anxiety about health   8. Chronic tension-type headache, not intractable    FMLA paper updated.  Meds ordered this encounter  Medications  . ibuprofen (ADVIL,MOTRIN) 200 MG tablet    Sig: Take 200 mg by mouth every 6 (six) hours as needed.  . hydrALAZINE (APRESOLINE) 10 MG tablet    Sig: Take 1-2 tablets (10-20 mg total) by mouth 3 (three) times daily. Prn for blood pressure >150/95    Dispense:  60 tablet    Refill:  1  . LORazepam (ATIVAN) 0.5 MG tablet    Sig: Take 1/2 tab po qam, 1/2 tab po qd prn, and 1 tab po qhs    Dispense:  60 tablet    Refill:  2   Over 40 min spent in face-to-face evaluation of and consultation with patient and coordination of care.  Over 50% of this time was spent counseling this patient.  I personally performed the services described in this documentation, which was scribed in my presence. The recorded information has been reviewed and considered, and addended by me as needed.   [name], M.D.  Primary Care at [facility] [address] [place], [place] [address] [phone] phone [phone]  fax  02/03/17 4:59 AM

## 2017-01-07 NOTE — Patient Instructions (Signed)
     IF you received an x-ray today, you will receive an invoice from [facility]. Please contact [facility] at [phone] with questions or concerns regarding your invoice.   IF you received labwork today, you will receive an invoice from [facility]. Please contact [facility] at [phone] with questions or concerns regarding your invoice.   Our billing staff will not be able to assist you with questions regarding bills from these companies.  You will be contacted with the lab results as soon as they are available. The fastest way to get your results is to activate your My Chart account. Instructions are located on the last page of this paperwork. If you have not heard from us regarding the results in 2 weeks, please contact this office.     

## 2017-01-19 ENCOUNTER — Other Ambulatory Visit: Payer: Self-pay | Admitting: Family Medicine

## 2017-01-25 DIAGNOSIS — M5136 Other intervertebral disc degeneration, lumbar region: Secondary | ICD-10-CM | POA: Diagnosis not present

## 2017-01-26 ENCOUNTER — Encounter: Payer: Self-pay | Admitting: Diagnostic Neuroimaging

## 2017-02-25 DIAGNOSIS — M5136 Other intervertebral disc degeneration, lumbar region: Secondary | ICD-10-CM | POA: Diagnosis not present

## 2017-03-15 ENCOUNTER — Emergency Department (HOSPITAL_COMMUNITY): Payer: 59

## 2017-03-15 ENCOUNTER — Emergency Department (HOSPITAL_COMMUNITY)
Admission: EM | Admit: 2017-03-15 | Discharge: 2017-03-15 | Disposition: A | Discharge status: Home / Self Care | Payer: 59 | Attending: Emergency Medicine | Admitting: Emergency Medicine

## 2017-03-15 ENCOUNTER — Encounter (HOSPITAL_COMMUNITY): Payer: Self-pay

## 2017-03-15 DIAGNOSIS — E119 Type 2 diabetes mellitus without complications: Secondary | ICD-10-CM | POA: Insufficient documentation

## 2017-03-15 DIAGNOSIS — Z5181 Encounter for therapeutic drug level monitoring: Secondary | ICD-10-CM | POA: Diagnosis not present

## 2017-03-15 DIAGNOSIS — I10 Essential (primary) hypertension: Secondary | ICD-10-CM | POA: Diagnosis not present

## 2017-03-15 DIAGNOSIS — Z87891 Personal history of nicotine dependence: Secondary | ICD-10-CM | POA: Diagnosis not present

## 2017-03-15 DIAGNOSIS — M542 Cervicalgia: Secondary | ICD-10-CM | POA: Diagnosis not present

## 2017-03-15 DIAGNOSIS — R51 Headache: Secondary | ICD-10-CM | POA: Insufficient documentation

## 2017-03-15 DIAGNOSIS — H538 Other visual disturbances: Secondary | ICD-10-CM | POA: Diagnosis not present

## 2017-03-15 DIAGNOSIS — R9431 Abnormal electrocardiogram [ECG] [EKG]: Secondary | ICD-10-CM | POA: Diagnosis not present

## 2017-03-15 DIAGNOSIS — R519 Headache, unspecified: Secondary | ICD-10-CM

## 2017-03-15 DIAGNOSIS — Z79899 Other long term (current) drug therapy: Secondary | ICD-10-CM | POA: Diagnosis not present

## 2017-03-15 LAB — URINALYSIS, ROUTINE W REFLEX MICROSCOPIC
Bilirubin Urine: NEGATIVE
Glucose, UA: NEGATIVE mg/dL
Ketones, ur: NEGATIVE mg/dL
Leukocytes, UA: NEGATIVE
Nitrite: NEGATIVE
Protein, ur: NEGATIVE mg/dL
Specific Gravity, Urine: 1.006 (ref 1.005–1.030)
pH: 6 (ref 5.0–8.0)

## 2017-03-15 LAB — I-STAT CHEM 8, ED
BUN: 8 mg/dL (ref 6–20)
Calcium, Ion: 1.13 mmol/L — ABNORMAL LOW (ref 1.15–1.40)
Chloride: 106 mmol/L (ref 101–111)
Creatinine, Ser: 1.1 mg/dL — ABNORMAL HIGH (ref 0.44–1.00)
Glucose, Bld: 105 mg/dL — ABNORMAL HIGH (ref 65–99)
HCT: 39 % (ref 36.0–46.0)
Hemoglobin: 13.3 g/dL (ref 12.0–15.0)
Potassium: 3.6 mmol/L (ref 3.5–5.1)
Sodium: 141 mmol/L (ref 135–145)
TCO2: 25 mmol/L (ref 0–100)

## 2017-03-15 LAB — COMPREHENSIVE METABOLIC PANEL
ALT: 32 U/L (ref 14–54)
AST: 30 U/L (ref 15–41)
Albumin: 4.5 g/dL (ref 3.5–5.0)
Alkaline Phosphatase: 40 U/L (ref 38–126)
Anion gap: 9 (ref 5–15)
BUN: 10 mg/dL (ref 6–20)
CO2: 24 mmol/L (ref 22–32)
Calcium: 8.9 mg/dL (ref 8.9–10.3)
Chloride: 106 mmol/L (ref 101–111)
Creatinine, Ser: 1.07 mg/dL — ABNORMAL HIGH (ref 0.44–1.00)
GFR calc Af Amer: >60 mL/min (ref >60)
GFR calc non Af Amer: >60 mL/min (ref >60)
Glucose, Bld: 106 mg/dL — ABNORMAL HIGH (ref 65–99)
Potassium: 3.7 mmol/L (ref 3.5–5.1)
Sodium: 139 mmol/L (ref 135–145)
Total Bilirubin: 0.9 mg/dL (ref 0.3–1.2)
Total Protein: 7.4 g/dL (ref 6.5–8.1)

## 2017-03-15 LAB — DIFFERENTIAL
Basophils Absolute: 0 10*3/uL (ref 0.0–0.1)
Basophils Relative: 0 %
Eosinophils Absolute: 0 10*3/uL (ref 0.0–0.7)
Eosinophils Relative: 1 %
Lymphocytes Relative: 59 %
Lymphs Abs: 3.9 10*3/uL (ref 0.7–4.0)
Monocytes Absolute: 0.5 10*3/uL (ref 0.1–1.0)
Monocytes Relative: 8 %
Neutro Abs: 2 10*3/uL (ref 1.7–7.7)
Neutrophils Relative %: 32 %

## 2017-03-15 LAB — I-STAT TROPONIN, ED: Troponin i, poc: 0 ng/mL (ref 0.00–0.08)

## 2017-03-15 LAB — RAPID URINE DRUG SCREEN, HOSP PERFORMED
Amphetamines: NOT DETECTED
Barbiturates: NOT DETECTED
Benzodiazepines: NOT DETECTED
Cocaine: NOT DETECTED
Opiates: NOT DETECTED
Tetrahydrocannabinol: NOT DETECTED

## 2017-03-15 LAB — CBC
HCT: 39.2 % (ref 36.0–46.0)
Hemoglobin: 13.7 g/dL (ref 12.0–15.0)
MCH: 31 pg (ref 26.0–34.0)
MCHC: 34.9 g/dL (ref 30.0–36.0)
MCV: 88.7 fL (ref 78.0–100.0)
Platelets: 234 10*3/uL (ref 150–400)
RBC: 4.42 MIL/uL (ref 3.87–5.11)
RDW: 12.8 % (ref 11.5–15.5)
WBC: 6.4 10*3/uL (ref 4.0–10.5)

## 2017-03-15 LAB — PROTIME-INR
INR: 1.03
Prothrombin Time: 13.5 seconds (ref 11.4–15.2)

## 2017-03-15 LAB — POC URINE PREG, ED: Preg Test, Ur: NEGATIVE

## 2017-03-15 LAB — APTT: aPTT: 27 seconds (ref 24–36)

## 2017-03-15 LAB — ETHANOL: Alcohol, Ethyl (B): <5 mg/dL (ref <5)

## 2017-03-15 LAB — CBG MONITORING, ED: Glucose-Capillary: 94 mg/dL (ref 65–99)

## 2017-03-15 MED ORDER — IOPAMIDOL (ISOVUE-370) INJECTION 76%
INTRAVENOUS | Status: AC
  Administered 2017-03-15: 100 mL via INTRAVENOUS
  Filled 2017-03-15: qty 100

## 2017-03-15 MED ORDER — IOPAMIDOL (ISOVUE-370) INJECTION 76%
100.0000 mL | Freq: Once | INTRAVENOUS | Status: AC | PRN
  Administered 2017-03-15: 100 mL via INTRAVENOUS

## 2017-03-15 NOTE — ED Triage Notes (Signed)
Pt c/o sudden onset of bilateral blurred vision and soreness in back of head since 45 minutes ago. Pt felt as if hand spasmed and felt weak prior to sleeping. Went to the gym yesterday and did weights and cardio , went in the steamroom. Denies weakness, numbness.

## 2017-03-15 NOTE — ED Provider Notes (Signed)
[facility] DEPT Provider Note   CSN: [id number] Arrival date & time: 03/15/17  0342     History   Chief Complaint Chief Complaint  Patient presents with  . Blurred Vision  . Headache    HPI Brooke Turner is a 41 y.o. female.  HPI  40 year old female presents with acute blurry vision. She states that she went to the gym yesterday and did almost everything like typical. She did do some sit ups with her head laying down, which is a new exercise for her. However she never felt pain or discomfort during this period around 1 AM tonight she was laying down before bed and had a moderate posterior headache. She does not take anything for it and went to bed. She woke up around 3:30 AM and had significant bilateral blurry vision. She states everything felt smudged, she was also seeing double. Seemed to wax and wane, including on the drive over here. It is much better at this point but still minimally present. However now she can read signs that are in the room which she was not able to do earlier. She does not wear contacts or glasses. No eye trauma or ocular pain. Currently she is now having a mild, 3/10 diffuse headache. No weakness or numbness in her extremity is. She has chronic palpitations but denies any recent change in this or new chest pain. She has been feeling nauseated without vomiting for about one week. She has a history of hypertension and last took her blood pressure medicine at around 1 AM this morning.  Past Medical History:  Diagnosis Date  . Anxiety   . Back pain   . Chest pain    a. 09/2014 Echo: EF 60-65%, no rwma, nl valves;  b. 12/2014 Neg ETT.  . Diabetes mellitus without complication ([place])   . Essential hypertension    a. 01/2016 Renal duplex: no RAS- ? renal cyst (renal u/s 4/17 - no cyst, nl study); b. 02/2016 24 hr BP Monitor: Mean SBP ~ 138 with Max of 198. Highest pressures noted in evening hours following placement of cuff.  . Fibroid uterus   . GERD  (gastroesophageal reflux disease)   . Palpitations    a. 09/2014 nl event monitor.    Patient Active Problem List   Diagnosis Date Noted  . Left facial pain 09/20/2016  . Head trauma 09/20/2016  . Palpitations   . Chest pain   . Left sided numbness 06/11/2015  . Chronic tension-type headache, not intractable 06/10/2015  . Dyspnea 03/18/2015  . Post-traumatic stress reaction 02/25/2015  . Anxiety disorder due to general medical condition with panic attack 02/25/2015  . Subserous leiomyoma of uterus 02/25/2015  . Anxiety about health 02/25/2015  . Deviated nasal septum 12/30/2014  . Chronic rhinitis 12/30/2014  . Chronic maxillary sinusitis 12/30/2014  . Snorings 09/30/2014  . Fatigue 09/26/2014  . Dizziness and giddiness 09/11/2014  . Tachycardia 09/11/2014  . Precordial chest pain 09/11/2014  . Prediabetes 08/26/2014  . Acanthosis nigricans 03/19/2014  . Vitamin D deficiency 07/15/2013  . Large breasts 09/12/2012  . Essential hypertension 02/17/2012  . Contraception management 02/17/2012  . Obesity (BMI 30-39.9) previous BMI was over 40. Patient has lost 70 pounds in the last year 02/17/2012  . Anxiety disorder 02/17/2012  . Chronic back pain 02/17/2012    Past Surgical History:  Procedure Laterality Date  . WISDOM TOOTH EXTRACTION      OB History    Gravida Para Term Preterm AB Living  2 0 0 0 2 0   SAB TAB Ectopic Multiple Live Births   1 1 0 0         Home Medications    Prior to Admission medications   Medication Sig Start Date End Date Taking? Authorizing Provider  albuterol (PROVENTIL HFA;VENTOLIN HFA) 108 (90 Base) MCG/ACT inhaler Inhale 2 puffs into the lungs every 4 (four) hours as needed for wheezing or shortness of breath (cough, shortness of breath or wheezing.). 10/25/16  Yes [name], MD  carvedilol (COREG) 3.125 MG tablet TAKE ONE TABLET BY MOUTH TWICE DAILY WITH MEALS. NEED OFFICE VISIT 01/20/17  Yes Jeffery, Chelle, PA-C  ibuprofen  (ADVIL,MOTRIN) 200 MG tablet Take 200 mg by mouth every 6 (six) hours as needed for moderate pain.    Yes [provider]  LORazepam (ATIVAN) 0.5 MG tablet Take 1/2 tab po qam, 1/2 tab po qd prn, and 1 tab po qhs Patient taking differently: Take 0.5 mg by mouth as needed for anxiety.  01/07/17  Yes [name], MD  Norethindrone-Ethinyl Estradiol-Fe Biphas (LO LOESTRIN FE) 1 MG-10 MCG / 10 MCG tablet Take 1 tablet by mouth daily.   Yes [provider]  ranitidine (ZANTAC) 150 MG tablet Take 1 tablet (150 mg total) by mouth 2 (two) times daily. 09/23/16  Yes [name], MD  blood glucose meter kit and supplies Test blood sugar twice daily. Dx code: R73.09 07/22/15   [name], MD  hydrALAZINE (APRESOLINE) 10 MG tablet Take 1-2 tablets (10-20 mg total) by mouth 3 (three) times daily. Prn for blood pressure >150/95 01/07/17   [name], MD  [facility] DELICA LANCETS 46E MISC USE TO TEST BLOOD SUGAR DAILY 10/17/16   [name], MD  [facility] VERIO test strip TEST BLOOD SUGAR DAILY 07/19/16   [name], MD  progesterone (PROMETRIUM) 200 MG capsule Take 1 capsule (200 mg total) by mouth at bedtime. Patient not taking: Reported on 03/15/2017 12/29/16   Manya Silvas, CNM    Family History Family History  Problem Relation Age of Onset  . Seizures Mother   . Cancer Father     Liver  . Diabetes Father 49  . Hypertension Father   . Heart disease Father 64    CEA, LE Stenting  . Alzheimer's disease Maternal Grandmother   . Heart disease Maternal Grandfather     Social History Social History  Substance Use Topics  . Smoking status: Former Smoker    Packs/day: 0.25    Years: 10.00    Types: Cigarettes    Quit date: 08/19/2014  . Smokeless tobacco: Never Used  . Alcohol use No     Allergies   Penicillins; Diltiazem; Tamiflu [oseltamivir phosphate]; and Tinidazole   Review of Systems Review of Systems  Constitutional: Negative for fever.  Eyes: Positive for visual  disturbance. Negative for pain.  Respiratory: Negative for shortness of breath.   Cardiovascular: Negative for chest pain.  Gastrointestinal: Positive for nausea. Negative for abdominal pain and vomiting.  Musculoskeletal: Positive for neck pain.  Neurological: Positive for light-headedness (when standing) and headaches. Negative for weakness and numbness.  All other systems reviewed and are negative.    Physical Exam Updated Vital Signs BP 133/81 (BP Location: Right Arm)   Pulse 84   Temp 98.1 F (36.7 C) (Oral)   Resp (!) 22   SpO2 100%   Physical Exam  Constitutional: She is oriented to person, place, and time. She  appears well-developed and well-nourished. No distress.  HENT:  Head: Normocephalic and atraumatic.  Right Ear: External ear normal.  Left Ear: External ear normal.  Nose: Nose normal.  Eyes: EOM are normal. Pupils are equal, round, and reactive to light. Right eye exhibits no discharge. Left eye exhibits no discharge.  Neck: Normal range of motion. Neck supple.  Mild bilateral paraspinal neck tenderness/occipital tenderness  Cardiovascular: Normal rate, regular rhythm and normal heart sounds.   Pulmonary/Chest: Effort normal and breath sounds normal.  Abdominal: Soft. There is no tenderness.  Neurological: She is alert and oriented to person, place, and time.  CN 3-12 grossly intact. 5/5 strength in all 4 extremities. Grossly normal sensation. Normal finger to nose.   Skin: Skin is warm and dry. She is not diaphoretic.  Nursing note and vitals reviewed.    ED Treatments / Results  Labs (all labs ordered are listed, but only abnormal results are displayed) Labs Reviewed  COMPREHENSIVE METABOLIC PANEL - Abnormal; Notable for the following:       Result Value   Glucose, Bld 106 (*)    Creatinine, Ser 1.07 (*)    All other components within normal limits  URINALYSIS, ROUTINE W REFLEX MICROSCOPIC - Abnormal; Notable for the following:    Hgb urine dipstick  SMALL (*)    Bacteria, UA RARE (*)    Squamous Epithelial / LPF 0-5 (*)    All other components within normal limits  I-STAT CHEM 8, ED - Abnormal; Notable for the following:    Creatinine, Ser 1.10 (*)    Glucose, Bld 105 (*)    Calcium, Ion 1.13 (*)    All other components within normal limits  ETHANOL  PROTIME-INR  APTT  CBC  DIFFERENTIAL  RAPID URINE DRUG SCREEN, HOSP PERFORMED  I-STAT TROPOININ, ED  POC URINE PREG, ED  CBG MONITORING, ED    EKG  EKG Interpretation None       Radiology Ct Angio Head W Or Wo Contrast  Result Date: 03/15/2017 CLINICAL DATA:  41 y/o F; neck pain and occipital headache after workout. Blurry vision. Concern for dissection. EXAM: CT ANGIOGRAPHY HEAD AND NECK TECHNIQUE: Multidetector CT imaging of the head and neck was performed using the standard protocol during bolus administration of intravenous contrast. Multiplanar CT image reconstructions and MIPs were obtained to evaluate the vascular anatomy. Carotid stenosis measurements (when applicable) are obtained utilizing NASCET criteria, using the distal internal carotid diameter as the denominator. CONTRAST:  100 cc Omnipaque 370 COMPARISON:  11/13/2015 MRI of the head FINDINGS: CT HEAD FINDINGS Brain: No evidence of acute infarction, hemorrhage, hydrocephalus, extra-axial collection or mass lesion/mass effect. Vascular: As below. Skull: Normal. Negative for fracture or focal lesion. Sinuses: Imaged portions are clear. Orbits: No acute finding. Review of the MIP images confirms the above findings CTA NECK FINDINGS Aortic arch: Standard branching. Imaged portion shows no evidence of aneurysm or dissection. No significant stenosis of the major arch vessel origins. Right carotid system: No evidence of dissection, stenosis (50% or greater) or occlusion. Left carotid system: No evidence of dissection, stenosis (50% or greater) or occlusion. Vertebral arteries: Right dominant. No evidence of dissection, stenosis  (50% or greater) or occlusion. Skeleton: Straightening of cervical lordosis. No acute osseous abnormality is evident. Other neck: Negative. Upper chest: Negative. Review of the MIP images confirms the above findings CTA HEAD FINDINGS Anterior circulation: No significant stenosis, proximal occlusion, aneurysm, or vascular malformation. Posterior circulation: No significant stenosis, proximal occlusion, aneurysm, or vascular malformation.  Venous sinuses: As permitted by contrast timing, patent. Anatomic variants: Probable diminutive left posterior communicating artery and anterior communicating artery. No right posterior communicating artery identified, likely hypoplastic or absent. Delayed phase: No abnormal intracranial enhancement. Review of the MIP images confirms the above findings IMPRESSION: 1. No acute intracranial abnormality on noncontrast CT of head. No abnormal enhancement of the brain. 2. Patent carotid and vertebral arteries. No large vessel occlusion, aneurysm, or significant stenosis is identified. 3. Patent circle of Willis. No large vessel occlusion, aneurysm, or significant stenosis is identified. Electronically Signed   By: Kristine Garbe M.D.   On: 03/15/2017 06:35   Ct Angio Neck W And/or Wo Contrast  Result Date: 03/15/2017 CLINICAL DATA:  41 y/o F; neck pain and occipital headache after workout. Blurry vision. Concern for dissection. EXAM: CT ANGIOGRAPHY HEAD AND NECK TECHNIQUE: Multidetector CT imaging of the head and neck was performed using the standard protocol during bolus administration of intravenous contrast. Multiplanar CT image reconstructions and MIPs were obtained to evaluate the vascular anatomy. Carotid stenosis measurements (when applicable) are obtained utilizing NASCET criteria, using the distal internal carotid diameter as the denominator. CONTRAST:  100 cc Omnipaque 370 COMPARISON:  11/13/2015 MRI of the head FINDINGS: CT HEAD FINDINGS Brain: No evidence of acute  infarction, hemorrhage, hydrocephalus, extra-axial collection or mass lesion/mass effect. Vascular: As below. Skull: Normal. Negative for fracture or focal lesion. Sinuses: Imaged portions are clear. Orbits: No acute finding. Review of the MIP images confirms the above findings CTA NECK FINDINGS Aortic arch: Standard branching. Imaged portion shows no evidence of aneurysm or dissection. No significant stenosis of the major arch vessel origins. Right carotid system: No evidence of dissection, stenosis (50% or greater) or occlusion. Left carotid system: No evidence of dissection, stenosis (50% or greater) or occlusion. Vertebral arteries: Right dominant. No evidence of dissection, stenosis (50% or greater) or occlusion. Skeleton: Straightening of cervical lordosis. No acute osseous abnormality is evident. Other neck: Negative. Upper chest: Negative. Review of the MIP images confirms the above findings CTA HEAD FINDINGS Anterior circulation: No significant stenosis, proximal occlusion, aneurysm, or vascular malformation. Posterior circulation: No significant stenosis, proximal occlusion, aneurysm, or vascular malformation. Venous sinuses: As permitted by contrast timing, patent. Anatomic variants: Probable diminutive left posterior communicating artery and anterior communicating artery. No right posterior communicating artery identified, likely hypoplastic or absent. Delayed phase: No abnormal intracranial enhancement. Review of the MIP images confirms the above findings IMPRESSION: 1. No acute intracranial abnormality on noncontrast CT of head. No abnormal enhancement of the brain. 2. Patent carotid and vertebral arteries. No large vessel occlusion, aneurysm, or significant stenosis is identified. 3. Patent circle of Willis. No large vessel occlusion, aneurysm, or significant stenosis is identified. Electronically Signed   By: Kristine Garbe M.D.   On: 03/15/2017 06:35    Procedures Procedures (including  critical care time)  Medications Ordered in ED Medications  iopamidol (ISOVUE-370) 76 % injection 100 mL (100 mLs Intravenous Contrast Given 03/15/17 0558)     Initial Impression / Assessment and Plan / ED Course  I have reviewed the triage vital signs and the nursing notes.  Pertinent labs & imaging results that were available during my care of the patient were reviewed by me and considered in my medical decision making (see chart for details).     No focal findings on her exam. Her vision is essentially back to baseline. Her BP was initially elevated but has come down quite quickly. Given recent exercises and neck  movement, CTA obtained and shows no obvious dissection. No head bleed. The headache is on the mild side so I think SAH is unlikely. D/w Neuro, Dr Leonel Ramsay, who recommends MRI. If negative it's probably an atypical migraine. Care to Dr. Billy Fischer, MRI pending.  Final Clinical Impressions(s) / ED Diagnoses   Final diagnoses:  Occipital headache    New Prescriptions New Prescriptions   No medications on file     Sherwood Gambler, MD 03/15/17 [phone]

## 2017-03-27 DIAGNOSIS — M5136 Other intervertebral disc degeneration, lumbar region: Secondary | ICD-10-CM | POA: Diagnosis not present

## 2017-04-06 ENCOUNTER — Other Ambulatory Visit: Payer: Self-pay | Admitting: Physician Assistant

## 2017-04-10 ENCOUNTER — Telehealth: Payer: Self-pay | Admitting: Family Medicine

## 2017-04-10 MED ORDER — CARVEDILOL 3.125 MG PO TABS: ORAL_TABLET | ORAL | 0 refills | Status: DC

## 2017-04-10 NOTE — Telephone Encounter (Signed)
Last seen 01/07/17

## 2017-04-10 NOTE — Telephone Encounter (Signed)
Pt is not needing the refill on her blood pressure meds just yet but when she does the pharmacy has told her that she needs a office visit and she was wanting to know if Dr. Brigitte Pulse could refill it once more until she is able to come in  Best number [phone]

## 2017-04-10 NOTE — Telephone Encounter (Signed)
Yes, definitely. I am going to be out of the office next week so I just went ahead and sent in the refill to [facility] so it will be there when she needs it as a new prescription, not as a refill on the current prescription number. I'll look forward to seeing her at some point in the next 3 months. How has her blood pressure been running at home?  Please transfer patient to scheduling so she can go ahead and get an appointment.

## 2017-04-11 NOTE — Telephone Encounter (Signed)
L/m with shaws note

## 2017-04-12 ENCOUNTER — Other Ambulatory Visit (HOSPITAL_COMMUNITY): Payer: Self-pay | Admitting: Chiropractic Medicine

## 2017-04-12 ENCOUNTER — Ambulatory Visit (HOSPITAL_COMMUNITY)
Admission: RE | Admit: 2017-04-12 | Discharge: 2017-04-12 | Disposition: A | Discharge status: Home / Self Care | Payer: 59 | Source: Ambulatory Visit | Attending: Chiropractic Medicine | Admitting: Chiropractic Medicine

## 2017-04-12 DIAGNOSIS — M546 Pain in thoracic spine: Secondary | ICD-10-CM | POA: Diagnosis present

## 2017-04-12 DIAGNOSIS — M542 Cervicalgia: Secondary | ICD-10-CM | POA: Insufficient documentation

## 2017-04-12 DIAGNOSIS — M545 Low back pain, unspecified: Secondary | ICD-10-CM

## 2017-04-27 DIAGNOSIS — M5136 Other intervertebral disc degeneration, lumbar region: Secondary | ICD-10-CM | POA: Diagnosis not present

## 2017-05-16 ENCOUNTER — Other Ambulatory Visit: Payer: Self-pay | Admitting: Family Medicine

## 2017-05-18 ENCOUNTER — Telehealth: Payer: Self-pay

## 2017-05-18 NOTE — Telephone Encounter (Signed)
Left message on patient's VM to call back. Attempting to refill rx and wanted to confirm the pharmacy that rx was going. Will fax to [facility] and cancel if necessary.

## 2017-05-23 ENCOUNTER — Telehealth: Payer: Self-pay

## 2017-05-23 NOTE — Telephone Encounter (Signed)
Rx for Lorazepam faxed to [facility] on 7.12.18 confirmation fax received.

## 2017-05-27 DIAGNOSIS — M5136 Other intervertebral disc degeneration, lumbar region: Secondary | ICD-10-CM | POA: Diagnosis not present

## 2017-06-02 ENCOUNTER — Telehealth: Payer: Self-pay | Admitting: Family Medicine

## 2017-06-02 NOTE — Telephone Encounter (Signed)
Pt is needing to talk with dr Brigitte Pulse regarding calling I a medicaioton for teeth and sinus pain that she was given a while back and she is not sure as to the name or it but she is having a flare up needs this call in to the [facility] [address] in [place] number [phone]

## 2017-06-05 NOTE — Telephone Encounter (Signed)
Patient will need to be seen in [place] we cannot send in prescription without her being seen

## 2017-06-27 DIAGNOSIS — M5136 Other intervertebral disc degeneration, lumbar region: Secondary | ICD-10-CM | POA: Diagnosis not present

## 2017-07-03 ENCOUNTER — Ambulatory Visit (INDEPENDENT_AMBULATORY_CARE_PROVIDER_SITE_OTHER): Payer: 59 | Admitting: Orthopaedic Surgery

## 2017-07-05 ENCOUNTER — Encounter: Payer: Self-pay | Admitting: Family Medicine

## 2017-07-05 ENCOUNTER — Ambulatory Visit (INDEPENDENT_AMBULATORY_CARE_PROVIDER_SITE_OTHER): Payer: 59 | Admitting: Family Medicine

## 2017-07-05 VITALS — BP 169/84 | HR 68 | Temp 98.8°F | Resp 17 | Ht 63.5 in | Wt 164.0 lb

## 2017-07-05 DIAGNOSIS — R002 Palpitations: Secondary | ICD-10-CM | POA: Diagnosis not present

## 2017-07-05 DIAGNOSIS — F411 Generalized anxiety disorder: Secondary | ICD-10-CM

## 2017-07-05 DIAGNOSIS — R0989 Other specified symptoms and signs involving the circulatory and respiratory systems: Secondary | ICD-10-CM

## 2017-07-05 NOTE — Progress Notes (Signed)
Subjective:  By signing my name below, I, Brooke Turner, attest that this documentation has been prepared under the direction and in the presence of [name], MD Electronically Signed: Ladene Artist, ED Scribe 07/05/2017 at 6:12 PM.   Patient ID: Brooke Turner, female    DOB: August 19, 1976, 41 y.o.   MRN: [id number]  Chief Complaint  Patient presents with  . FMLA paperwork   HPI  Brooke Turner is a 41 y.o. female who presents to Primary Care at [facility] for [facility] paperwork.   Pt states that her BP has been elevated recently. Reports intermittent heart palpitations a few days/week when her BP is elevated, but states palpitations have improved overall. States she has not been taking Lorazepam often; only took it prior to dentist visit and one or two other times. She reports chronic fatigue but states that she has been falling asleep earlier than usual. Reports exercising at the gym 4-5 days/week which she suspects may be contributing to fatigue. Denies leg swelling.  Pt reports constant right-sided mid back pain that radiates into her chest. She reports that back pain is exacerbated with exercising such as sit-ups/crunches. Pt has seen a chiropractor for electrostimulation and [place] which she states did not provided significant relief. Also reports intermittent left calf pain x 1 year.   Past Medical History:  Diagnosis Date  . Anxiety   . Back pain   . Chest pain    a. 09/2014 Echo: EF 60-65%, no rwma, nl valves;  b. 12/2014 Neg ETT.  . Diabetes mellitus without complication ([place])   . Essential hypertension    a. 01/2016 Renal duplex: no RAS- ? renal cyst (renal u/s 4/17 - no cyst, nl study); b. 02/2016 24 hr BP Monitor: Mean SBP ~ 138 with Max of 198. Highest pressures noted in evening hours following placement of cuff.  . Fibroid uterus   . GERD (gastroesophageal reflux disease)   . Palpitations    a. 09/2014 nl event monitor.   Current Outpatient Prescriptions on File  Prior to Visit  Medication Sig Dispense Refill  . blood glucose meter kit and supplies Test blood sugar twice daily. Dx code: R73.09 1 each 0  . carvedilol (COREG) 3.125 MG tablet TAKE ONE TABLET BY MOUTH TWICE DAILY WITH MEALS. 180 tablet 0  . hydrALAZINE (APRESOLINE) 10 MG tablet Take 1-2 tablets (10-20 mg total) by mouth 3 (three) times daily. Prn for blood pressure >150/95 60 tablet 1  . ibuprofen (ADVIL,MOTRIN) 200 MG tablet Take 200 mg by mouth every 6 (six) hours as needed for moderate pain.     Marland Kitchen LORazepam (ATIVAN) 0.5 MG tablet TAKE ONE TABLET BY MOUTH EVERY 6 HOURS AS NEEDED FOR ANXIETY 60 tablet 2  . Norethindrone-Ethinyl Estradiol-Fe Biphas (LO LOESTRIN FE) 1 MG-10 MCG / 10 MCG tablet Take 1 tablet by mouth daily.    [name] DELICA LANCETS 58N MISC USE TO TEST BLOOD SUGAR DAILY 100 each 0  . ONETOUCH VERIO test strip TEST BLOOD SUGAR DAILY 25 each 10  . progesterone (PROMETRIUM) 200 MG capsule Take 1 capsule (200 mg total) by mouth at bedtime. 10 capsule 0  . ranitidine (ZANTAC) 150 MG tablet Take 1 tablet (150 mg total) by mouth 2 (two) times daily. 60 tablet 11  . albuterol (PROVENTIL HFA;VENTOLIN HFA) 108 (90 Base) MCG/ACT inhaler Inhale 2 puffs into the lungs every 4 (four) hours as needed for wheezing or shortness of breath (cough, shortness of breath or wheezing.). (Patient not  taking: Reported on 07/05/2017) 1 Inhaler 1  . [DISCONTINUED] diltiazem (CARDIZEM CD) 120 MG 24 hr capsule Take 1 capsule (120 mg total) by mouth daily. 30 capsule 11   No current facility-administered medications on file prior to visit.    Allergies  Allergen Reactions  . Penicillins Hives    Has patient had a PCN reaction causing immediate rash, facial/tongue/throat swelling, SOB or lightheadedness with hypotension:Yes Has patient had a PCN reaction causing severe rash involving mucus membranes or skin necrosis:unsure Has patient had a PCN reaction that required hospitalization: Yes Has patient had  a PCN reaction occurring within the last 10 years: No If all of the above answers are "NO", then may proceed with Cephalosporin use.     . Diltiazem     Dizzy, felt like she was going to pass out Blood pressure went up per patient   . Tamiflu [Oseltamivir Phosphate] Nausea And Vomiting    Excessive vomiting  . Tinidazole     Caused severe abd pain/type of colitis    Past Surgical History:  Procedure Laterality Date  . WISDOM TOOTH EXTRACTION     Family History  Problem Relation Age of Onset  . Seizures Mother   . Cancer Father        Liver  . Diabetes Father 36  . Hypertension Father   . Heart disease Father 43       CEA, LE Stenting  . Alzheimer's disease Maternal Grandmother   . Heart disease Maternal Grandfather    Social History   Social History  . Marital status: Single    Spouse name: N/A  . Number of children: 0  . Years of education: 79   Occupational History  . Customer Service [employer]   Social History Main Topics  . Smoking status: Former Smoker    Packs/day: 0.25    Years: 10.00    Types: Cigarettes    Quit date: 08/19/2014  . Smokeless tobacco: Never Used  . Alcohol use No  . Drug use: No  . Sexual activity: Not Currently    Partners: Male    Birth control/ protection: Pill   Other Topics Concern  . None   Social History Narrative   Patient lives at home alone .   Patient works full time at [employer]   Education college.   Right handed.   Caffeine none    Depression screen [facility] 2/9 07/05/2017 01/07/2017 10/25/2016 06/08/2016 04/12/2016  Decreased Interest 0 0 0 0 0  Down, Depressed, Hopeless 0 0 0 0 1  PHQ - 2 Score 0 0 0 0 1  Some recent data might be hidden    Review of Systems  Constitutional: Positive for fatigue.  Cardiovascular: Positive for palpitations. Negative for leg swelling.  Musculoskeletal: Positive for back pain and myalgias.      Objective:   Physical Exam  Constitutional: She is oriented to person, place,  and time. She appears well-developed and well-nourished. No distress.  HENT:  Head: Normocephalic and atraumatic.  Eyes: Conjunctivae and EOM are normal.  Neck: Neck supple. No tracheal deviation present.  Cardiovascular: Normal rate, regular rhythm, S1 normal, S2 normal and normal heart sounds.   Pulmonary/Chest: Effort normal and breath sounds normal. No respiratory distress.  Musculoskeletal: Normal range of motion. She exhibits no edema.  Neurological: She is alert and oriented to person, place, and time.  Skin: Skin is warm and dry.  Psychiatric: She has a normal mood and affect. Her behavior is normal.  Nursing note and vitals reviewed.  BP (!) 169/84   Pulse 68   Temp 98.8 F (37.1 C) (Oral)   Resp 17   Ht 5' 3.5" (1.613 m)   Wt 164 lb (74.4 kg)   LMP 07/05/2017 (Approximate)   SpO2 98%   BMI 28.60 kg/m     Assessment & Plan:   1. Palpitations   2. Generalized anxiety disorder - FMLA forms updated during visit.  3. Labile blood pressure  - increase carvedilol 3.125 bid - pt reluctant due to side effects but willing to try to increase to 6.25 qam and cont 3.125 qhs   Over 25 min spent in face-to-face evaluation of and consultation with patient and coordination of care.  Over 50% of this time was spent counseling this patient regarding anxiety, BP.  I personally performed the services described in this documentation, which was scribed in my presence. The recorded information has been reviewed and considered, and addended by me as needed.   [name], M.D.  Primary Care at [facility] [address] [place], [place] [address] [phone] phone [phone] fax  07/09/17 2:38 AM

## 2017-07-05 NOTE — Patient Instructions (Signed)
     IF you received an x-ray today, you will receive an invoice from [facility]. Please contact [facility] at [phone] with questions or concerns regarding your invoice.   IF you received labwork today, you will receive an invoice from [facility]. Please contact [facility] at [phone] with questions or concerns regarding your invoice.   Our billing staff will not be able to assist you with questions regarding bills from these companies.  You will be contacted with the lab results as soon as they are available. The fastest way to get your results is to activate your My Chart account. Instructions are located on the last page of this paperwork. If you have not heard from us regarding the results in 2 weeks, please contact this office.     

## 2017-07-12 ENCOUNTER — Telehealth: Payer: Self-pay | Admitting: Cardiology

## 2017-07-12 NOTE — Telephone Encounter (Signed)
New message    Pt c/o of Chest Pain: STAT if CP now or developed within 24 hours  1. Are you having CP right now? yes  2. Are you experiencing any other symptoms (ex. SOB, nausea, vomiting, sweating)? Nausea, lightheaded, dizzy  3. How long have you been experiencing CP? Last couple of days  4. Is your CP continuous or coming and going? Coming and going. Pt states it will go away and then she will feel a burning sensation and then the pain comes back.  5. Have you taken Nitroglycerin? No, pt states she doesn't have. ?

## 2017-07-12 NOTE — Telephone Encounter (Signed)
Left a message to call back, per dpr

## 2017-07-12 NOTE — Telephone Encounter (Signed)
Patient called in while at work with complaints of a burning sensation in her chest for the past 2-3 days. She has had nausea and diarrhea. She currently takes rantidine and tums without any relief. She saw her PCP on 8/29 for palpitations, anxiety and blood pressure. Her carvedilol was increased to 3.125 bid.   The patient would like an appointment. The earliest is Monday 9/10 with Rosaria Ferries, PA. She has been advised by the DOD to go to Urgent Care to be assessed but she declined. She did state that if her symptoms got worse before Monday, she would go to urgent care or the ED.

## 2017-07-17 ENCOUNTER — Telehealth: Payer: Self-pay

## 2017-07-17 ENCOUNTER — Ambulatory Visit (INDEPENDENT_AMBULATORY_CARE_PROVIDER_SITE_OTHER): Payer: 59 | Admitting: Physician Assistant

## 2017-07-17 ENCOUNTER — Encounter: Payer: Self-pay | Admitting: Physician Assistant

## 2017-07-17 VITALS — BP 178/102 | HR 84 | Ht 64.0 in | Wt 166.2 lb

## 2017-07-17 DIAGNOSIS — R079 Chest pain, unspecified: Secondary | ICD-10-CM | POA: Diagnosis not present

## 2017-07-17 DIAGNOSIS — I10 Essential (primary) hypertension: Secondary | ICD-10-CM | POA: Diagnosis not present

## 2017-07-17 NOTE — Progress Notes (Signed)
Cardiology Office Note   Date:  07/17/2017   ID:  Brooke Turner, DOB [birth year], MRN [id number]  PCP:  [name], MD  Cardiologist:  Dr. Percival Spanish, 04/10/2016  Darienne Belleau, Suanne Marker, PA-C    History of Present Illness: Brooke Turner is a 41 y.o. female with a history of palpitations on Cardizem (normal event monitor), HTN, negative treadmill in 2016, normal echo in 2015, GERD, GAD/PTSD  04/10/2016 office visit, patient complaining of shooting chest pain, palpitations, labile BPs and lightheaded feeling. Patient was encouraged to see a psychiatrist, Dr. Percival Spanish advised that he could not manage her blood pressure or heart rate further until then.  07/05/2017 PCP visit for anxiety, palpitations, hypertension carvedilol was increased to 3.125 mg twice a day 07/12/2017 phone note patient complaining of burning chest pain for 2-3 days, ranitidine and Tums no relief.  Brooke Turner presents for cardiology follow up and evaluation.  She was concerned about the chest pain she has been having. The pain started the day after she had mowed the yard in the heat. After a day, she took an ibuprofen, but that made it worse. She did not take any more. She also had some soft stools.   She has now had chest heaviness for about a week. It reached a 9/10, at first when it was at its worst. It is currently a 7/10. She has chest wall tenderness and the sx are worse with inspiration.   She has been seeing a therapist for about a year and feels this has helped her. However, she feels very anxious about her chest pain.   When she is anxious, her BP goes up.   Past Medical History:  Diagnosis Date  . Anxiety   . Back pain   . Chest pain    a. 09/2014 Echo: EF 60-65%, no rwma, nl valves;  b. 12/2014 Neg ETT.  . Diabetes mellitus without complication ([place])   . Essential hypertension    a. 01/2016 Renal duplex: no RAS- ? renal cyst (renal u/s 4/17 - no cyst, nl study); b. 02/2016 24  hr BP Monitor: Mean SBP ~ 138 with Max of 198. Highest pressures noted in evening hours following placement of cuff.  . Fibroid uterus   . GERD (gastroesophageal reflux disease)   . Palpitations    a. 09/2014 nl event monitor.    Past Surgical History:  Procedure Laterality Date  . WISDOM TOOTH EXTRACTION      Current Outpatient Prescriptions  Medication Sig Dispense Refill  . blood glucose meter kit and supplies Test blood sugar twice daily. Dx code: R73.09 1 each 0  . carvedilol (COREG) 3.125 MG tablet TAKE ONE TABLET BY MOUTH TWICE DAILY WITH MEALS. 180 tablet 0  . hydrALAZINE (APRESOLINE) 10 MG tablet Take 1-2 tablets (10-20 mg total) by mouth 3 (three) times daily. Prn for blood pressure >150/95 60 tablet 1  . ibuprofen (ADVIL,MOTRIN) 200 MG tablet Take 200 mg by mouth every 6 (six) hours as needed for moderate pain.     Marland Kitchen LORazepam (ATIVAN) 0.5 MG tablet TAKE ONE TABLET BY MOUTH EVERY 6 HOURS AS NEEDED FOR ANXIETY 60 tablet 2  . Norethindrone-Ethinyl Estradiol-Fe Biphas (LO LOESTRIN FE) 1 MG-10 MCG / 10 MCG tablet Take 1 tablet by mouth daily.    Brooke Turner DELICA LANCETS 56D MISC USE TO TEST BLOOD SUGAR DAILY 100 each 0  . ONETOUCH VERIO test strip TEST BLOOD SUGAR DAILY 25 each 10  . ranitidine (ZANTAC)  150 MG tablet Take 1 tablet (150 mg total) by mouth 2 (two) times daily. 60 tablet 11   No current facility-administered medications for this visit.     Allergies:   Penicillins; Diltiazem; Tamiflu [oseltamivir phosphate]; and Tinidazole    Social History:  The patient  reports that she quit smoking about 2 years ago. Her smoking use included Cigarettes. She has a 2.50 pack-year smoking history. She has never used smokeless tobacco. She reports that she does not drink alcohol or use drugs.   Family History:  The patient's family history includes Alzheimer's disease in her maternal grandmother; Cancer in her father; Diabetes (age of onset: 31) in her father; Heart disease in her  maternal grandfather; Heart disease (age of onset: 76) in her father; Hypertension in her father; Seizures in her mother.    ROS:  Please see the history of present illness. All other systems are reviewed and negative.    PHYSICAL EXAM: VS:  BP (!) 178/102   Pulse 84   Ht 5' 4"  (1.626 m)   Wt 166 lb 3.2 oz (75.4 kg)   LMP 07/05/2017 (Approximate)   BMI 28.53 kg/m  , BMI Body mass index is 28.53 kg/m. GEN: Well nourished, well developed, female in no acute distress  HEENT: normal for age  Neck: no JVD, no carotid bruit, no masses Cardiac: RRR; no murmur, no rubs, or gallops Respiratory:  clear to auscultation bilaterally, normal work of breathing GI: soft, nontender, nondistended, + BS MS: no deformity or atrophy; no edema; distal pulses are 2+ in all 4 extremities   Skin: warm and dry, no rash Neuro:  Strength and sensation are intact Psych: euthymic mood, full affect   EKG:  EKG is ordered today. The ekg ordered today demonstrates SR, HR 84, no acute ischemic changes.   Recent Labs: 03/15/2017: ALT 32; BUN 8; Creatinine, Ser 1.10; Hemoglobin 13.3; Platelets 234; Potassium 3.6; Sodium 141    Lipid Panel    Component Value Date/Time   CHOL 172 10/25/2016 1850   TRIG 87 10/25/2016 1850   HDL 49 10/25/2016 1850   CHOLHDL 3.5 10/25/2016 1850   LDLCALC 106 (H) 10/25/2016 1850   LDLDIRECT 101 (H) 10/25/2016 1850     Wt Readings from Last 3 Encounters:  07/17/17 166 lb 3.2 oz (75.4 kg)  07/05/17 164 lb (74.4 kg)  01/07/17 155 lb 4 oz (70.4 kg)     Other studies Reviewed: Additional studies/ records that were reviewed today include: office notes, hospital records and testing.  ASSESSMENT AND PLAN:  1.  Chest pain, low risk for cardiac etiology: Pt is very concerned about the pain. I discussed possible etiologies of MS pain vs GI sx. Suggested she try Mylanta or Tylenol for the pain. Will get GXT Echo to eval heart muscle function and ischemia. If this is negative, no  further cardiac eval.   Current medicines are reviewed at length with the patient today.  The patient does not have concerns regarding medicines.  The following changes have been made:  no change  Labs/ tests ordered today include:   Orders Placed This Encounter  Procedures  . EKG 12-Lead  . ECHOCARDIOGRAM STRESS TEST     Disposition:   FU with Dr. Percival Spanish  Signed, Rosaria Ferries, PA-C  07/17/2017 3:13 PM    [place] Phone: [phone]; Fax: [phone]  This note was written with the assistance of speech recognition software. Please excuse any transcriptional errors.

## 2017-07-17 NOTE — Telephone Encounter (Signed)
Lmvm pt has follow up appt scheduled to f/u w/Rhonda 07-28-17 ok to reschedule this appt until after stress echo scheduled for 10-18, ok to schedule f/u appt to 08-28-2017

## 2017-07-17 NOTE — Patient Instructions (Signed)
Medication Instructions:  NO CHANGES Your physician recommends that you continue on your current medications as directed. Please refer to the Current Medication list given to you today. If you need a refill on your cardiac medications before your next appointment, please call your pharmacy.  Testing/Procedures: Your physician has requested that you have a stress echocardiogram. For further information please visit [email or web address]. Please follow instruction sheet as given.  Follow-Up: Your physician wants you to follow-up in: AS SCHEDULED WITH [facility] 07-28-2017 @ 830AM.   Special Instructions: OK TO TAKE TYLENOL AND MYLANTA     Thank you for choosing [facility] at [facility]!!

## 2017-07-18 ENCOUNTER — Telehealth: Payer: Self-pay | Admitting: Family Medicine

## 2017-07-18 NOTE — Telephone Encounter (Signed)
LM2CB-NEED TO RE-SCHEDULE RHONDA'S 9-21 APPT UNTIL AFTER STRESS ECHO ON 08-24-17. TO REVIEW RESULTS  AND WHAT IS PT'S JOB TITLE AT WORK FOR BACK TO WORK NOTE

## 2017-07-18 NOTE — Telephone Encounter (Signed)
Patient called stating that Brooke Turner has not gotten her FMLA forms that she left with Dr Brigitte Pulse on 07/05/17. I have not seen these forms so I was not aware of them. Have you completed the forms? If so do you still have them and can you please give me a copy of ALL FMLA forms that are given to you in an office visit so that they can get scanned in and documented that we have them in case they get lost or misplaced.  If you have the forms please place them in my box at the 102 checkout desk as soon as possible thank you!

## 2017-07-19 NOTE — Telephone Encounter (Signed)
Lm2cb 

## 2017-07-20 NOTE — Telephone Encounter (Signed)
Lm2cb 

## 2017-07-21 ENCOUNTER — Other Ambulatory Visit: Payer: Self-pay | Admitting: Family Medicine

## 2017-07-24 NOTE — Telephone Encounter (Signed)
Patient states they were not completed in the visit that she left them with you and she does not have a copy of them

## 2017-07-24 NOTE — Telephone Encounter (Signed)
If I did them during the office visit, then I always request that a copy get faxed in to employer then scanned into chart with the original completed form returned to patient - does she has a copy of the completed form she could submit?  I will look through my stack of papers and see if I still have it and have not completed it yet as well but I don't think I do. . . .

## 2017-07-24 NOTE — Telephone Encounter (Signed)
Do you know anything about these forms? I still have not seen anything with this patients name on it.

## 2017-07-26 NOTE — Telephone Encounter (Signed)
No answer-appt 07-28-17 has been cancelled will await appts for treadmill and ECHO results to reschedule f/u appt

## 2017-07-28 ENCOUNTER — Ambulatory Visit: Payer: 59 | Admitting: Physician Assistant

## 2017-07-28 DIAGNOSIS — M5136 Other intervertebral disc degeneration, lumbar region: Secondary | ICD-10-CM | POA: Diagnosis not present

## 2017-07-29 NOTE — Telephone Encounter (Signed)
I have papers and will complete before I leave today Sat 9/22 so we can have faxed on Mon a.m.

## 2017-07-30 ENCOUNTER — Inpatient Hospital Stay (HOSPITAL_COMMUNITY)
Admission: AD | Admit: 2017-07-30 | Discharge: 2017-07-30 | Disposition: A | Discharge status: Home / Self Care | Payer: 59 | Source: Ambulatory Visit | Attending: Obstetrics and Gynecology | Admitting: Obstetrics and Gynecology

## 2017-07-30 ENCOUNTER — Encounter (HOSPITAL_COMMUNITY): Payer: Self-pay

## 2017-07-30 DIAGNOSIS — N939 Abnormal uterine and vaginal bleeding, unspecified: Secondary | ICD-10-CM | POA: Insufficient documentation

## 2017-07-30 DIAGNOSIS — Z87891 Personal history of nicotine dependence: Secondary | ICD-10-CM | POA: Insufficient documentation

## 2017-07-30 DIAGNOSIS — Z3202 Encounter for pregnancy test, result negative: Secondary | ICD-10-CM | POA: Diagnosis not present

## 2017-07-30 DIAGNOSIS — Z880 Allergy status to penicillin: Secondary | ICD-10-CM | POA: Diagnosis not present

## 2017-07-30 LAB — POCT PREGNANCY, URINE: Preg Test, Ur: NEGATIVE

## 2017-07-30 LAB — CBC
HCT: 36.8 % (ref 36.0–46.0)
Hemoglobin: 12.7 g/dL (ref 12.0–15.0)
MCH: 31 pg (ref 26.0–34.0)
MCHC: 34.5 g/dL (ref 30.0–36.0)
MCV: 89.8 fL (ref 78.0–100.0)
Platelets: 219 10*3/uL (ref 150–400)
RBC: 4.1 MIL/uL (ref 3.87–5.11)
RDW: 12.9 % (ref 11.5–15.5)
WBC: 5.5 10*3/uL (ref 4.0–10.5)

## 2017-07-30 LAB — URINALYSIS, ROUTINE W REFLEX MICROSCOPIC
Bilirubin Urine: NEGATIVE
Glucose, UA: NEGATIVE mg/dL
Ketones, ur: NEGATIVE mg/dL
Leukocytes, UA: NEGATIVE
Nitrite: NEGATIVE
Protein, ur: NEGATIVE mg/dL
Specific Gravity, Urine: 1.018 (ref 1.005–1.030)
Squamous Epithelial / LPF: NONE SEEN
WBC, UA: NONE SEEN WBC/hpf (ref 0–5)
pH: 5 (ref 5.0–8.0)

## 2017-07-30 MED ORDER — PROGESTERONE MICRONIZED 200 MG PO CAPS
200.0000 mg | ORAL_CAPSULE | Freq: Once | ORAL | Status: DC
  Filled 2017-07-30: qty 1

## 2017-07-30 MED ORDER — PROGESTERONE MICRONIZED 200 MG PO CAPS: 200.0000 mg | ORAL_CAPSULE | Freq: Every day | ORAL | 0 refills | Status: DC

## 2017-07-30 NOTE — MAU Note (Signed)
Patient has had her period x 10 days, has had this problem before was on Prometrium, called her MD office on Friday did not call her back, lightened up and heavier today, passed large clots today. Has history of fibroids.

## 2017-07-30 NOTE — Discharge Instructions (Signed)
In late 2019, the [facility] will be moving to the [place]. At that time, the MAU will no longer serve non-pregnant patients. We encourage you to establish care with a provider before that time, so that you can be seen with any GYN concerns, like vaginal discharge, urinary tract infection, etc.. in a timely manner. In order to make the office visit more convenient, the [facility] [place] [facility] will be offering evening hours from 4pm-8pm on Mondays starting 07/17/17. There will be same-day appointments, walk-in appointments and scheduled appointments available during this time.    [facility] [place] [facility] [phone]  For urgent needs, [facility] is also available for management of urgent GYN complaints such as vaginal discharge.   [employer] extends eligibility for family planning services to reduce unintended pregnancies and improve the well-being of children and families.   Eligible individuals whose income is at or below 195% of the federal poverty level and who are:  - [place] citizens, documented immigrants or qualified aliens;  - Residents of [place];  - Not incarcerated; and  - Not pregnant.   [employer] Family Planning Contact Information:  Medical Assistance Clinical Section Phone: [phone] Email: [email or web address]    Abnormal Uterine Bleeding Abnormal uterine bleeding can affect women at various stages in life, including teenagers, women in their reproductive years, pregnant women, and women who have reached menopause. Several kinds of uterine bleeding are considered abnormal, including:  Bleeding or spotting between periods.  Bleeding after sexual intercourse.  Bleeding that is heavier or more than normal.  Periods that last longer than usual.  Bleeding after menopause.  Many cases of abnormal uterine bleeding are minor and simple to treat, while others are  more serious. Any type of abnormal bleeding should be evaluated by your health care provider. Treatment will depend on the cause of the bleeding. Follow these instructions at home: Monitor your condition for any changes. The following actions may help to alleviate any discomfort you are experiencing:  Avoid the use of tampons and douches as directed by your health care provider.  Change your pads frequently.  You should get regular pelvic exams and Pap tests. Keep all follow-up appointments for diagnostic tests as directed by your health care provider. Contact a health care provider if:  Your bleeding lasts more than 1 week.  You feel dizzy at times. Get help right away if:  You pass out.  You are changing pads every 15 to 30 minutes.  You have abdominal pain.  You have a fever.  You become sweaty or weak.  You are passing large blood clots from the vagina.  You start to feel nauseous and vomit. This information is not intended to replace advice given to you by your health care provider. Make sure you discuss any questions you have with your health care provider. Document Released: 10/24/2005 Document Revised: 04/06/2016 Document Reviewed: 05/23/2013 Elsevier Interactive Patient Education  2017 [employer].

## 2017-07-30 NOTE — MAU Provider Note (Signed)
History     CSN: [id number]  Arrival date and time: 07/30/17 1349   First Provider Initiated Contact with Patient 07/30/17 1529      Chief Complaint  Patient presents with  . Vaginal Bleeding   Brooke Turner is a 41 y.o. G2P0020 who presents today with vaginal bleeding x 10 days. She has called her GYN office, and has not been able to get in touch with them. She states that she had prometrium for this same problem in the past, and it has helped. She is taking LoLoEstrin for contraception, and her OB doesn't want her on higher dose OCPs due to her hypertension. She has a significant history of anxiety.    Vaginal Bleeding  The patient's primary symptoms include pelvic pain and vaginal bleeding. This is a new problem. The current episode started 1 to 4 weeks ago. The problem occurs constantly. The problem has been unchanged. The pain is moderate. The problem affects both sides. She is not pregnant. The vaginal bleeding is heavier than menses. She has been passing clots (about the size of an apple. ). She has not been passing tissue. Nothing aggravates the symptoms. She has tried nothing for the symptoms. She uses oral contraceptives for contraception.    Past Medical History:  Diagnosis Date  . Anxiety   . Back pain   . Chest pain    a. 09/2014 Echo: EF 60-65%, no rwma, nl valves;  b. 12/2014 Neg ETT.  . Diabetes mellitus without complication ([place])   . Essential hypertension    a. 01/2016 Renal duplex: no RAS- ? renal cyst (renal u/s 4/17 - no cyst, nl study); b. 02/2016 24 hr BP Monitor: Mean SBP ~ 138 with Max of 198. Highest pressures noted in evening hours following placement of cuff.  . Fibroid uterus   . GERD (gastroesophageal reflux disease)   . Palpitations    a. 09/2014 nl event monitor.    Past Surgical History:  Procedure Laterality Date  . WISDOM TOOTH EXTRACTION      Family History  Problem Relation Age of Onset  . Seizures Mother   . Cancer Father     Liver  . Diabetes Father 18  . Hypertension Father   . Heart disease Father 76       CEA, LE [place]  . Alzheimer's disease Maternal Grandmother   . Heart disease Maternal Grandfather     Social History  Substance Use Topics  . Smoking status: Former Smoker    Packs/day: 0.25    Years: 10.00    Types: Cigarettes    Quit date: 08/19/2014  . Smokeless tobacco: Never Used  . Alcohol use No    Allergies:  Allergies  Allergen Reactions  . Penicillins Hives    Has patient had a PCN reaction causing immediate rash, facial/tongue/throat swelling, SOB or lightheadedness with hypotension:Yes Has patient had a PCN reaction causing severe rash involving mucus membranes or skin necrosis:unsure Has patient had a PCN reaction that required hospitalization: Yes Has patient had a PCN reaction occurring within the last 10 years: No If all of the above answers are "NO", then may proceed with Cephalosporin use.     . Diltiazem     Dizzy, felt like she was going to pass out Blood pressure went up per patient   . Tamiflu [Oseltamivir Phosphate] Nausea And Vomiting    Excessive vomiting  . Tinidazole     Caused severe abd pain/type of colitis     Prescriptions  Prior to Admission  Medication Sig Dispense Refill Last Dose  . Biotin 1000 MCG tablet Take 1,000 mcg by mouth daily.   Past Month at Unknown time  . calcium carbonate (TUMS - DOSED IN MG ELEMENTAL CALCIUM) 500 MG chewable tablet Chew 500 mg by mouth daily as needed for indigestion or heartburn.   Past Month at Unknown time  . carvedilol (COREG) 3.125 MG tablet Take 2 tabs po qam and 1 tab po qhs (Patient taking differently: Take 3.125 mg by mouth 2 (two) times daily. ) 270 tablet 0 07/30/2017 at 1300  . LORazepam (ATIVAN) 0.5 MG tablet Take 0.5 mg by mouth every 6 (six) hours as needed for anxiety.   07/30/2017 at Unknown time  . Norethindrone-Ethinyl Estradiol-Fe Biphas (LO LOESTRIN FE) 1 MG-10 MCG / 10 MCG tablet Take 1 tablet by  mouth daily.   07/30/2017 at Unknown time  . Prenatal Vit-Fe Fumarate-FA (PRENATAL MULTIVITAMIN) TABS tablet Take 1 tablet by mouth daily at 12 noon.   07/30/2017 at Unknown time  . ranitidine (ZANTAC) 150 MG tablet Take 1 tablet (150 mg total) by mouth 2 (two) times daily. 60 tablet 11 07/30/2017 at Unknown time  . blood glucose meter kit and supplies Test blood sugar twice daily. Dx code: R73.09 1 each 0 Taking  . ONETOUCH DELICA LANCETS 24M MISC USE TO TEST BLOOD SUGAR DAILY 100 each 0 Taking  . ONETOUCH VERIO test strip TEST BLOOD SUGAR DAILY 25 each 10 Taking    Review of Systems  Genitourinary: Positive for pelvic pain and vaginal bleeding.   Physical Exam   Blood pressure (!) 144/75, pulse 74, temperature 98.3 F (36.8 C), temperature source Oral, resp. rate 16, height 5' 4"  (1.626 m), last menstrual period 07/05/2017, SpO2 100 %.  Physical Exam  Nursing note and vitals reviewed. Constitutional: She is oriented to person, place, and time. She appears well-developed and well-nourished. No distress.  HENT:  Head: Normocephalic.  Cardiovascular: Normal rate.   Respiratory: Effort normal.  GI: Soft. There is no tenderness. There is no rebound.  Neurological: She is alert and oriented to person, place, and time.  Skin: Skin is warm and dry.  Psychiatric: She has a normal mood and affect.   Results for orders placed or performed during the hospital encounter of 07/30/17 (from the past 24 hour(s))  Urinalysis, Routine w reflex microscopic     Status: Abnormal   Collection Time: 07/30/17  1:55 PM  Result Value Ref Range   Color, Urine YELLOW YELLOW   APPearance CLEAR CLEAR   Specific Gravity, Urine 1.018 1.005 - 1.030   pH 5.0 5.0 - 8.0   Glucose, UA NEGATIVE NEGATIVE mg/dL   Hgb urine dipstick LARGE (A) NEGATIVE   Bilirubin Urine NEGATIVE NEGATIVE   Ketones, ur NEGATIVE NEGATIVE mg/dL   Protein, ur NEGATIVE NEGATIVE mg/dL   Nitrite NEGATIVE NEGATIVE   Leukocytes, UA NEGATIVE  NEGATIVE   RBC / HPF TOO NUMEROUS TO COUNT 0 - 5 RBC/hpf   WBC, UA NONE SEEN 0 - 5 WBC/hpf   Bacteria, UA RARE (A) NONE SEEN   Squamous Epithelial / LPF NONE SEEN NONE SEEN   Mucus PRESENT   Pregnancy, urine POC     Status: None   Collection Time: 07/30/17  2:46 PM  Result Value Ref Range   Preg Test, Ur NEGATIVE NEGATIVE  CBC     Status: None   Collection Time: 07/30/17  4:01 PM  Result Value Ref Range   WBC  5.5 4.0 - 10.5 K/uL   RBC 4.10 3.87 - 5.11 MIL/uL   Hemoglobin 12.7 12.0 - 15.0 g/dL   HCT 36.8 36.0 - 46.0 %   MCV 89.8 78.0 - 100.0 fL   MCH 31.0 26.0 - 34.0 pg   MCHC 34.5 30.0 - 36.0 g/dL   RDW 12.9 11.5 - 15.5 %   Platelets 219 150 - 400 K/uL    MAU Course  Procedures  MDM   Assessment and Plan   1. Abnormal uterine bleeding    DC home Comfort measures reviewed  Bleeding precautions RX: prometrium 280m qHS x 10 days  Return to MAU as needed FU with OB as planned  Follow-up Information    Morris, Megan, DO Follow up.   Specialty:  Obstetrics and Gynecology Contact information: [address] [phone]             HMarcille Buffy9/23/2018, 3:34 PM

## 2017-07-30 NOTE — MAU Note (Signed)
Pt complains of vaginal bleeding for 10 days with an increase in bleeding and passing of a large clot this morning. Mild cramping at times noted.

## 2017-07-31 NOTE — Telephone Encounter (Signed)
Returned to [employer].   - I do not have the form she brought in - I have searched sev times through everywhere I think it could have been left/stuck. I wrote in her note that I completed the forms during the visit which is what I remember doing. . .  Normally when I do this (when a pt has come in for a visit just to have the forms completed and they are straightforward - basically just copied off of last years) I give the form to a CMA and asked that a copy be made to be faxed to employer/HR, then put in the scan file, and the original returned to pt before they leave. I have no idea what happened to them but so far nothing has shown up in the scanned Media tab but I know that can take a while.  I printed off last years forms and re-completed them - hopefully this will suffice.

## 2017-07-31 NOTE — Telephone Encounter (Signed)
Paperwork scanned and faxed to [facility] on 07/31/17

## 2017-08-24 ENCOUNTER — Ambulatory Visit (HOSPITAL_BASED_OUTPATIENT_CLINIC_OR_DEPARTMENT_OTHER): Payer: 59

## 2017-08-24 ENCOUNTER — Ambulatory Visit (HOSPITAL_COMMUNITY): Payer: 59 | Attending: Cardiovascular Disease

## 2017-08-24 DIAGNOSIS — I10 Essential (primary) hypertension: Secondary | ICD-10-CM | POA: Diagnosis not present

## 2017-08-24 DIAGNOSIS — E119 Type 2 diabetes mellitus without complications: Secondary | ICD-10-CM | POA: Diagnosis not present

## 2017-08-24 DIAGNOSIS — R079 Chest pain, unspecified: Secondary | ICD-10-CM | POA: Diagnosis not present

## 2017-08-27 DIAGNOSIS — M5136 Other intervertebral disc degeneration, lumbar region: Secondary | ICD-10-CM | POA: Diagnosis not present

## 2017-08-30 DIAGNOSIS — Z01419 Encounter for gynecological examination (general) (routine) without abnormal findings: Secondary | ICD-10-CM | POA: Diagnosis not present

## 2017-09-12 DIAGNOSIS — Z1231 Encounter for screening mammogram for malignant neoplasm of breast: Secondary | ICD-10-CM | POA: Diagnosis not present

## 2017-09-27 DIAGNOSIS — M5136 Other intervertebral disc degeneration, lumbar region: Secondary | ICD-10-CM | POA: Diagnosis not present

## 2017-10-10 ENCOUNTER — Other Ambulatory Visit: Payer: Self-pay

## 2017-10-10 ENCOUNTER — Ambulatory Visit: Payer: 59 | Admitting: Physician Assistant

## 2017-10-10 ENCOUNTER — Ambulatory Visit: Payer: 59 | Admitting: Family Medicine

## 2017-10-10 ENCOUNTER — Encounter: Payer: Self-pay | Admitting: Physician Assistant

## 2017-10-10 VITALS — BP 128/87 | HR 83 | Temp 98.8°F | Resp 18 | Ht 64.06 in | Wt 170.2 lb

## 2017-10-10 DIAGNOSIS — N926 Irregular menstruation, unspecified: Secondary | ICD-10-CM | POA: Diagnosis not present

## 2017-10-10 DIAGNOSIS — R52 Pain, unspecified: Secondary | ICD-10-CM | POA: Diagnosis not present

## 2017-10-10 DIAGNOSIS — J069 Acute upper respiratory infection, unspecified: Secondary | ICD-10-CM

## 2017-10-10 DIAGNOSIS — R82998 Other abnormal findings in urine: Secondary | ICD-10-CM

## 2017-10-10 DIAGNOSIS — J029 Acute pharyngitis, unspecified: Secondary | ICD-10-CM | POA: Diagnosis not present

## 2017-10-10 DIAGNOSIS — R350 Frequency of micturition: Secondary | ICD-10-CM

## 2017-10-10 LAB — POCT URINALYSIS DIP (MANUAL ENTRY)
Bilirubin, UA: NEGATIVE
Glucose, UA: NEGATIVE mg/dL
Ketones, POC UA: NEGATIVE mg/dL
Nitrite, UA: NEGATIVE
Protein Ur, POC: NEGATIVE mg/dL
Spec Grav, UA: 1.01 (ref 1.010–1.025)
Urobilinogen, UA: 0.2 E.U./dL
pH, UA: 6.5 (ref 5.0–8.0)

## 2017-10-10 LAB — POCT INFLUENZA A/B
Influenza A, POC: NEGATIVE
Influenza B, POC: NEGATIVE

## 2017-10-10 LAB — POCT URINE PREGNANCY: Preg Test, Ur: NEGATIVE

## 2017-10-10 LAB — POCT RAPID STREP A (OFFICE): Rapid Strep A Screen: NEGATIVE

## 2017-10-10 MED ORDER — NITROFURANTOIN MONOHYD MACRO 100 MG PO CAPS: 100.0000 mg | ORAL_CAPSULE | Freq: Two times a day (BID) | ORAL | 0 refills | Status: AC

## 2017-10-10 MED ORDER — FLUTICASONE PROPIONATE 50 MCG/ACT NA SUSP: 2.0000 | Freq: Every day | NASAL | 0 refills | Status: DC

## 2017-10-10 MED ORDER — MAGIC MOUTHWASH W/LIDOCAINE: 10.0000 mL | ORAL | 0 refills | Status: DC | PRN

## 2017-10-10 NOTE — Progress Notes (Signed)
MRN: [id number] DOB: 1976/08/16  Subjective:   Brooke Turner is a 41 y.o. female presenting for chief complaint of Generalized Body Aches (X 1 day); Abdominal Pain; Nausea; Fever (X 1 day); and Sore Throat .  Reports 3 day history of Fever (101), sinus congestion, rhinorrhea, sore throat, myalgia, chills, fatigue,and nausea. The sore throat is the worst part. Reminds her of when she had strep. Has tried tylenol with relief of fever. Denies cough, ear pain, difficulty swallowing, wheezing, shortness of breath and chest pain, weight loss, vomiting and diarrhea. Has not had sick contact with anyone. Patient has had flu shot this season.   She is also having some urinary frequency and urinary urgency.  Has associated suprapubic pressure.  Denies hematuria, dysuria, and flank pain. She has been drinking more water since she got sick.  Patient is also requesting a pregnancy test because her last menstrual cycle was 07/18/17.  She has a history of irregular cycles.  She is on birth control.  She would just like to know if she is pregnant.  She denies any breast tenderness or morning sickness.  Danelia has a current medication list which includes the following prescription(s): biotin, blood glucose meter kit and supplies, calcium carbonate, carvedilol, lorazepam, norethindrone-ethinyl estradiol-fe biphas, onetouch delica lancets 84X, onetouch verio, prenatal multivitamin, progesterone, ranitidine, fluticasone, magic mouthwash w/lidocaine, and nitrofurantoin (macrocrystal-monohydrate). Also is allergic to penicillins; diltiazem; tamiflu [oseltamivir phosphate]; and tinidazole.  Bellamy  has a past medical history of Anxiety, Back pain, Chest pain, Diabetes mellitus without complication ([place]), Essential hypertension, Fibroid uterus, GERD (gastroesophageal reflux disease), and Palpitations. Also  has a past surgical history that includes Wisdom tooth extraction.   Objective:   Vitals: BP 128/87 (BP  Location: Left Arm, Patient Position: Sitting, Cuff Size: Large)   Pulse 83   Temp 98.8 F (37.1 C) (Oral)   Resp 18   Ht 5' 4.06" (1.627 m)   Wt 170 lb 3.2 oz (77.2 kg)   LMP 07/18/2017 (Approximate)   SpO2 99%   BMI 29.16 kg/m   Physical Exam  Constitutional: She is oriented to person, place, and time. She appears well-developed and well-nourished. She does not appear ill. No distress.  HENT:  Head: Normocephalic and atraumatic.  Right Ear: Tympanic membrane, external ear and ear canal normal.  Left Ear: Tympanic membrane, external ear and ear canal normal.  Nose: Mucosal edema present. Right sinus exhibits no maxillary sinus tenderness and no frontal sinus tenderness. Left sinus exhibits no maxillary sinus tenderness and no frontal sinus tenderness.  Mouth/Throat: Uvula is midline and mucous membranes are normal. Posterior oropharyngeal erythema (mild) present. Tonsils are 1+ on the right. Tonsils are 1+ on the left. No tonsillar exudate.  Eyes: Conjunctivae are normal.  Neck: Normal range of motion.  Cardiovascular: Normal rate, regular rhythm and normal heart sounds.  Pulmonary/Chest: Effort normal and breath sounds normal. She has no wheezes. She has no rales.  Abdominal: Normal appearance and bowel sounds are normal. There is tenderness (mild) in the suprapubic area. There is no CVA tenderness.  Lymphadenopathy:       Head (right side): No submental, no submandibular, no tonsillar, no preauricular, no posterior auricular and no occipital adenopathy present.       Head (left side): No submental, no submandibular, no tonsillar, no preauricular, no posterior auricular and no occipital adenopathy present.    She has no cervical adenopathy.       Right: No supraclavicular adenopathy present.  Left: No supraclavicular adenopathy present.  Neurological: She is alert and oriented to person, place, and time.  Skin: Skin is warm and dry.  Psychiatric: She has a normal mood and  affect.  Vitals reviewed.   Results for orders placed or performed in visit on 10/10/17 (from the past 24 hour(s))  POCT Influenza A/B     Status: Normal   Collection Time: 10/10/17  5:22 PM  Result Value Ref Range   Influenza A, POC Negative Negative   Influenza B, POC Negative Negative  POCT rapid strep A     Status: Normal   Collection Time: 10/10/17  5:22 PM  Result Value Ref Range   Rapid Strep A Screen Negative Negative  POCT urine pregnancy     Status: Normal   Collection Time: 10/10/17  5:22 PM  Result Value Ref Range   Preg Test, Ur Negative Negative  POCT urinalysis dipstick     Status: Abnormal   Collection Time: 10/10/17  5:59 PM  Result Value Ref Range   Color, UA yellow yellow   Clarity, UA cloudy (A) clear   Glucose, UA negative negative mg/dL   Bilirubin, UA negative negative   Ketones, POC UA negative negative mg/dL   Spec Grav, UA 1.010 1.010 - 1.025   Blood, UA small (A) negative   pH, UA 6.5 5.0 - 8.0   Protein Ur, POC negative negative mg/dL   Urobilinogen, UA 0.2 0.2 or 1.0 E.U./dL   Nitrite, UA Negative Negative   Leukocytes, UA Large (3+) (A) Negative    Assessment and Plan :  1. Generalized body aches - POCT Influenza A/B  2. Sore throat Rapid strep test negative.  Culture pending. - POCT rapid strep A - Culture, Group A Strep - magic mouthwash w/lidocaine SOLN; Take 10 mLs by mouth every 2 (two) hours as needed for mouth pain.  Dispense: 360 mL; Refill: 0  3. Missed period Negative pregnancy test in-house. - POCT urine pregnancy  4. Urinary frequency - POCT urinalysis dipstick - Urine Culture  5. Leukocytes in urine Due to urinary symptoms along with large and small blood on UA, will treat empirically for UTI at this time.  Urine culture pending. - nitrofurantoin, macrocrystal-monohydrate, (MACROBID) 100 MG capsule; Take 1 capsule (100 mg total) by mouth 2 (two) times daily for 5 days.  Dispense: 10 capsule; Refill: 0  6. Acute upper  respiratory infection Patient is well-appearing.  Vitals are stable.  She is afebrile.  Flu test was negative.  Will treat symptomatically for viral URI. Encouraged to return to clinic if symptoms worsen, do not improve, or as needed. - fluticasone (FLONASE) 50 MCG/ACT nasal spray; Place 2 sprays into both nostrils daily.  Dispense: 16 g; Refill: 0  Tenna Delaine, PA-C  [facility] at [place] 10/11/2017 11:17 AM

## 2017-10-10 NOTE — Patient Instructions (Addendum)
Your symptoms are consistent with a flu like illness. I recommend treating this symptomatically and resting. You may use flonase for nasal/sinus congestion along with an over the counter antihistamine. I also recommend using ibuprofen for sore throat and fever.  If the ibuprofen causes chest pain as it did in the past, stop using it and use Tylenol.  I have also given you a prescription for an oral mouthwash that will help with the sore throat.  I recommend eating a bland diet and introducing foods as tolerated.  I sent off a culture for your throat and should have those results back in 3-5 days.  Please return to office if any of your symptoms worsen or you develop new concerning symptoms. Thank you for letting me participate in your health and well being.  Your results indicate you have a UTI. I have given you a prescription for an antibiotic. Please take with food. I have sent off a urine culture and we should have those results in 48 hours. If your symptoms worsen while you are awaiting these results or you develop fever, chills, flank pian, nausea and vomiting, please seek care immediately.      IF you received an x-ray today, you will receive an invoice from [facility]. Please contact [facility] at [phone] with questions or concerns regarding your invoice.   IF you received labwork today, you will receive an invoice from [facility]. Please contact [facility] at [phone] with questions or concerns regarding your invoice.   Our billing staff will not be able to assist you with questions regarding bills from these companies.  You will be contacted with the lab results as soon as they are available. The fastest way to get your results is to activate your My Chart account. Instructions are located on the last page of this paperwork. If you have not heard from [place] regarding the results in 2 weeks, please contact this office.     Influenza like Illness, Adult Influenza ("the  flu") is an infection in the lungs, nose, and throat (respiratory tract). It is caused by a virus. The flu causes many common cold symptoms, as well as a high fever and body aches. It can make you feel very sick. The flu spreads easily from person to person (is contagious). Getting a flu shot (influenza vaccination) every year is the best way to prevent the flu. Follow these instructions at home:  Take over-the-counter and prescription medicines only as told by your doctor.  Use a cool mist humidifier to add moisture (humidity) to the air in your home. This can make it easier to breathe.  Rest as needed.  Drink enough fluid to keep your pee (urine) clear or pale yellow.  Cover your mouth and nose when you cough or sneeze.  Wash your hands with soap and water often, especially after you cough or sneeze. If you cannot use soap and water, use hand sanitizer.  Stay home from work or school as told by your doctor. Unless you are visiting your doctor, try to avoid leaving home until your fever has been gone for 24 hours without the use of medicine.  Keep all follow-up visits as told by your doctor. This is important. How is this prevented?  Getting a yearly (annual) flu shot is the best way to avoid getting the flu. You may get the flu shot in late summer, fall, or winter. Ask your doctor when you should get your flu shot.  Wash your hands often or  use hand sanitizer often.  Avoid contact with people who are sick during cold and flu season.  Eat healthy foods.  Drink plenty of fluids.  Get enough sleep.  Exercise regularly. Contact a doctor if:  You get new symptoms.  You have: ? Chest pain. ? Watery poop (diarrhea). ? A fever.  Your cough gets worse.  You start to have more mucus.  You feel sick to your stomach (nauseous).  You throw up (vomit). Get help right away if:  You start to be short of breath or have trouble breathing.  Your skin or nails turn a bluish  color.  You have very bad pain or stiffness in your neck.  You get a sudden headache.  You get sudden pain in your face or ear.  You cannot stop throwing up. This information is not intended to replace advice given to you by your health care provider. Make sure you discuss any questions you have with your health care provider. Document Released: 08/02/2008 Document Revised: 03/31/2016 Document Reviewed: 08/18/2015 Elsevier Interactive Patient Education  2017 Elsevier Inc.   Urinary Tract Infection, Adult A urinary tract infection (UTI) is an infection of any part of the urinary tract. The urinary tract includes the:  Kidneys.  Ureters.  Bladder.  Urethra.  These organs make, store, and get rid of pee (urine) in the body. Follow these instructions at home:  Take over-the-counter and prescription medicines only as told by your doctor.  If you were prescribed an antibiotic medicine, take it as told by your doctor. Do not stop taking the antibiotic even if you start to feel better.  Avoid the following drinks: ? Alcohol. ? Caffeine. ? Tea. ? Carbonated drinks.  Drink enough fluid to keep your pee clear or pale yellow.  Keep all follow-up visits as told by your doctor. This is important.  Make sure to: ? Empty your bladder often and completely. Do not to hold pee for long periods of time. ? Empty your bladder before and after sex. ? Wipe from front to back after a bowel movement if you are female. Use each tissue one time when you wipe. Contact a doctor if:  You have back pain.  You have a fever.  You feel sick to your stomach (nauseous).  You throw up (vomit).  Your symptoms do not get better after 3 days.  Your symptoms go away and then come back. Get help right away if:  You have very bad back pain.  You have very bad lower belly (abdominal) pain.  You are throwing up and cannot keep down any medicines or water. This information is not intended to  replace advice given to you by your health care provider. Make sure you discuss any questions you have with your health care provider. Document Released: 04/11/2008 Document Revised: 03/31/2016 Document Reviewed: 09/14/2015 Elsevier Interactive Patient Education  [employer].

## 2017-10-12 ENCOUNTER — Other Ambulatory Visit: Payer: Self-pay | Admitting: Family Medicine

## 2017-10-12 LAB — URINE CULTURE

## 2017-10-13 LAB — CULTURE, GROUP A STREP: Strep A Culture: NEGATIVE

## 2017-10-19 NOTE — Progress Notes (Signed)
No answer via phone. Lab letter sent

## 2017-10-27 DIAGNOSIS — M5136 Other intervertebral disc degeneration, lumbar region: Secondary | ICD-10-CM | POA: Diagnosis not present

## 2017-10-31 DIAGNOSIS — K219 Gastro-esophageal reflux disease without esophagitis: Secondary | ICD-10-CM | POA: Diagnosis not present

## 2017-10-31 DIAGNOSIS — I10 Essential (primary) hypertension: Secondary | ICD-10-CM | POA: Diagnosis not present

## 2017-10-31 DIAGNOSIS — R05 Cough: Secondary | ICD-10-CM | POA: Diagnosis not present

## 2017-10-31 DIAGNOSIS — R079 Chest pain, unspecified: Secondary | ICD-10-CM | POA: Diagnosis not present

## 2017-10-31 DIAGNOSIS — J40 Bronchitis, not specified as acute or chronic: Secondary | ICD-10-CM | POA: Diagnosis not present

## 2017-11-27 DIAGNOSIS — M5136 Other intervertebral disc degeneration, lumbar region: Secondary | ICD-10-CM | POA: Diagnosis not present

## 2017-12-28 DIAGNOSIS — M5136 Other intervertebral disc degeneration, lumbar region: Secondary | ICD-10-CM | POA: Diagnosis not present

## 2018-01-11 ENCOUNTER — Other Ambulatory Visit: Payer: Self-pay | Admitting: Family Medicine

## 2018-01-12 NOTE — Telephone Encounter (Signed)
Ativan refill request - controlled substance  LOV 07/05/17 with Dr. Merrie Roof [address] [place].

## 2018-01-18 ENCOUNTER — Encounter: Payer: Self-pay | Admitting: Family Medicine

## 2018-01-18 ENCOUNTER — Ambulatory Visit (INDEPENDENT_AMBULATORY_CARE_PROVIDER_SITE_OTHER): Payer: 59 | Admitting: Family Medicine

## 2018-01-18 VITALS — BP 148/90 | HR 78 | Temp 98.5°F | Ht 63.0 in | Wt 172.4 lb

## 2018-01-18 DIAGNOSIS — R4589 Other symptoms and signs involving emotional state: Secondary | ICD-10-CM

## 2018-01-18 DIAGNOSIS — R002 Palpitations: Secondary | ICD-10-CM | POA: Diagnosis not present

## 2018-01-18 DIAGNOSIS — F411 Generalized anxiety disorder: Secondary | ICD-10-CM

## 2018-01-18 DIAGNOSIS — F4310 Post-traumatic stress disorder, unspecified: Secondary | ICD-10-CM

## 2018-01-18 DIAGNOSIS — F418 Other specified anxiety disorders: Secondary | ICD-10-CM | POA: Diagnosis not present

## 2018-01-18 DIAGNOSIS — I10 Essential (primary) hypertension: Secondary | ICD-10-CM

## 2018-01-18 DIAGNOSIS — R000 Tachycardia, unspecified: Secondary | ICD-10-CM

## 2018-01-18 DIAGNOSIS — R079 Chest pain, unspecified: Secondary | ICD-10-CM

## 2018-01-18 MED ORDER — LORAZEPAM 0.5 MG PO TABS: 0.5000 mg | ORAL_TABLET | Freq: Four times a day (QID) | ORAL | 2 refills | Status: DC | PRN

## 2018-01-18 MED ORDER — CARVEDILOL 3.125 MG PO TABS: 3.1250 mg | ORAL_TABLET | Freq: Two times a day (BID) | ORAL | 1 refills | Status: DC

## 2018-01-18 NOTE — Progress Notes (Addendum)
Subjective:  By signing my name below, I, [name], attest that this documentation has been prepared under the direction and in the presence of [name], MD Electronically Signed: Ladene Artist, ED Scribe 01/18/2018 at 6:03 PM.   Patient ID: Brooke Turner, female    DOB: 01/12/1976, 42 y.o.   MRN: [id number]  Chief Complaint  Patient presents with  . Chronic Conditons    FMLA Paperwork to be completed and BP Check   HPI Brooke Turner is a 42 y.o. female who presents to Primary Care at [facility] to discuss FMLA paperwork.  Anxiety Pt states that both panic and anxiety have improved, however, anxiety is still there. Also reports that she is still having weekly and nightly palpitations. Pt is currently taking 1 coreg tab bid. She is missing a few hours of work in the mornings due to anxiety. States she has to take ativan in the mornings and when she goes to the dentist but never has to leave work early. Pt has been seeing a therapist for a yr which she states helps.  HTN Pt has checked her BP outside of the office multiple times with average readings of 130s/70s, however, she reports feeling lightheaded one day at work with a BP of 119/64 with and a HR of 58. She has been exercising at the gym regularly.  Immunizations Immunization History  Administered Date(s) Administered  . Influenza,inj,Quad PF,6+ Mos 08/12/2015  Tetanus: updated today  Past Medical History:  Diagnosis Date  . Anxiety   . Back pain   . Chest pain    a. 09/2014 Echo: EF 60-65%, no rwma, nl valves;  b. 12/2014 Neg ETT.  . Diabetes mellitus without complication ([place])   . Essential hypertension    a. 01/2016 Renal duplex: no RAS- ? renal cyst (renal u/s 4/17 - no cyst, nl study); b. 02/2016 24 hr BP Monitor: Mean SBP ~ 138 with Max of 198. Highest pressures noted in evening hours following placement of cuff.  . Fibroid uterus   . GERD (gastroesophageal reflux disease)   . Palpitations    a.  09/2014 nl event monitor.   Current Outpatient Medications on File Prior to Visit  Medication Sig Dispense Refill  . blood glucose meter kit and supplies Test blood sugar twice daily. Dx code: R73.09 1 each 0  . calcium carbonate (TUMS - DOSED IN MG ELEMENTAL CALCIUM) 500 MG chewable tablet Chew 500 mg by mouth daily as needed for indigestion or heartburn.    . carvedilol (COREG) 3.125 MG tablet Take 2 tabs po qam and 1 tab po qhs (Patient taking differently: Take 3.125 mg by mouth 2 (two) times daily. ) 270 tablet 0  . fluticasone (FLONASE) 50 MCG/ACT nasal spray Place 2 sprays into both nostrils daily. 16 g 0  . LORazepam (ATIVAN) 0.5 MG tablet Take 0.5 mg by mouth every 6 (six) hours as needed for anxiety.    . Norethindrone-Ethinyl Estradiol-Fe Biphas (LO LOESTRIN FE) 1 MG-10 MCG / 10 MCG tablet Take 1 tablet by mouth daily.    [name] DELICA LANCETS 82X MISC USE TO TEST BLOOD SUGAR DAILY 100 each 0  . ONETOUCH VERIO test strip TEST BLOOD SUGAR DAILY 25 each 10  . Prenatal Vit-Fe Fumarate-FA (PRENATAL MULTIVITAMIN) TABS tablet Take 1 tablet by mouth daily at 12 noon.    . progesterone (PROMETRIUM) 200 MG capsule Take 1 capsule (200 mg total) by mouth daily. 10 capsule 0  . ranitidine (ZANTAC) 150  MG tablet TAKE ONE TABLET BY MOUTH TWICE DAILY 60 tablet 11  . Biotin 1000 MCG tablet Take 1,000 mcg by mouth daily.    . [DISCONTINUED] diltiazem (CARDIZEM CD) 120 MG 24 hr capsule Take 1 capsule (120 mg total) by mouth daily. 30 capsule 11   No current facility-administered medications on file prior to visit.    Past Surgical History:  Procedure Laterality Date  . WISDOM TOOTH EXTRACTION     Allergies  Allergen Reactions  . Penicillins Hives    Has patient had a PCN reaction causing immediate rash, facial/tongue/throat swelling, SOB or lightheadedness with hypotension:Yes Has patient had a PCN reaction causing severe rash involving mucus membranes or skin necrosis:unsure Has patient had  a PCN reaction that required hospitalization: Yes Has patient had a PCN reaction occurring within the last 10 years: No If all of the above answers are "NO", then may proceed with Cephalosporin use.     . Diltiazem     Dizzy, felt like she was going to pass out Blood pressure went up per patient   . Tamiflu [Oseltamivir Phosphate] Nausea And Vomiting    Excessive vomiting  . Tinidazole     Caused severe abd pain/type of colitis    Family History  Problem Relation Age of Onset  . Seizures Mother   . Cancer Father        Liver  . Diabetes Father 85  . Hypertension Father   . Heart disease Father 32       CEA, LE [place]  . Alzheimer's disease Maternal Grandmother   . Heart disease Maternal Grandfather    Social History   Socioeconomic History  . Marital status: Single    Spouse name: N/A  . Number of children: 0  . Years of education: 53  . Highest education level: Not on file  Occupational History  . Occupation: Research scientist (physical sciences): [employer]  Social Needs  . Financial resource strain: Not on file  . Food insecurity:    Worry: Not on file    Inability: Not on file  . Transportation needs:    Medical: Not on file    Non-medical: Not on file  Tobacco Use  . Smoking status: Former Smoker    Packs/day: 0.25    Years: 10.00    Pack years: 2.50    Types: Cigarettes    Last attempt to quit: 08/19/2014    Years since quitting: 3.8  . Smokeless tobacco: Never Used  Substance and Sexual Activity  . Alcohol use: No    Alcohol/week: 0.0 standard drinks  . Drug use: No  . Sexual activity: Not Currently    Partners: Male    Birth control/protection: Pill  Lifestyle  . Physical activity:    Days per week: Not on file    Minutes per session: Not on file  . Stress: Not on file  Relationships  . Social connections:    Talks on phone: Not on file    Gets together: Not on file    Attends religious service: Not on file    Active member of club or  organization: Not on file    Attends meetings of clubs or organizations: Not on file    Relationship status: Not on file  Other Topics Concern  . Not on file  Social History Narrative   Patient lives at home alone .   Patient works full time at [employer]   Education college.   Right handed.  Caffeine none   Depression screen [facility] 2/9 07/02/2018 01/18/2018 10/10/2017 07/05/2017 01/07/2017  Decreased Interest 0 0 0 0 0  Down, Depressed, Hopeless 0 0 0 0 0  PHQ - 2 Score 0 0 0 0 0  Some recent data might be hidden     Review of Systems  Neurological: Light-headedness: resolved.  Psychiatric/Behavioral: The patient is nervous/anxious.       Objective:   Physical Exam  Constitutional: She is oriented to person, place, and time. She appears well-developed and well-nourished. No distress.  HENT:  Head: Normocephalic and atraumatic.  Eyes: Conjunctivae and EOM are normal.  Neck: Neck supple. No tracheal deviation present. No thyromegaly present.  Cardiovascular: Normal rate, regular rhythm and normal heart sounds.  Pulmonary/Chest: Effort normal and breath sounds normal. No respiratory distress.  Musculoskeletal: Normal range of motion.  Neurological: She is alert and oriented to person, place, and time.  Skin: Skin is warm and dry.  Psychiatric: She has a normal mood and affect. Her behavior is normal.  Nursing note and vitals reviewed.  BP (!) 148/90 (BP Location: Left Arm, Patient Position: Sitting, Cuff Size: Normal)   Pulse 78   Temp 98.5 F (36.9 C) (Oral)   Ht 5' 3"  (1.6 m)   Wt 172 lb 6.4 oz (78.2 kg)   LMP 07/10/2017   SpO2 100%   BMI 30.54 kg/m     Assessment & Plan:   1. Palpitations   2. Chest pain, unspecified type   3. Anxiety about health   4. Post-traumatic stress reaction   5. Tachycardia   6. Essential hypertension   7. Generalized anxiety disorder     Orders Placed This Encounter  Procedures  . Tdap vaccine greater than or equal to 7yo IM    Meds  ordered this encounter  Medications  . carvedilol (COREG) 3.125 MG tablet    Sig: Take 1 tablet (3.125 mg total) by mouth 2 (two) times daily with a meal.    Dispense:  180 tablet    Refill:  1  . LORazepam (ATIVAN) 0.5 MG tablet    Sig: Take 1 tablet (0.5 mg total) by mouth every 6 (six) hours as needed for anxiety.    Dispense:  30 tablet    Refill:  2    I personally performed the services described in this documentation, which was scribed in my presence. The recorded information has been reviewed and considered, and addended by me as needed.   [name], M.D.  Primary Care at [facility] [address] [place], [place] [address] [phone] phone [phone] fax  07/02/18 6:25 PM

## 2018-01-18 NOTE — Telephone Encounter (Signed)
Patient requesting refill on Ativan, last OV 07/05/17. Please advise thank you,

## 2018-01-18 NOTE — Patient Instructions (Signed)
     IF you received an x-ray today, you will receive an invoice from [facility]. Please contact [facility] at [phone] with questions or concerns regarding your invoice.   IF you received labwork today, you will receive an invoice from [facility]. Please contact [facility] at [phone] with questions or concerns regarding your invoice.   Our billing staff will not be able to assist you with questions regarding bills from these companies.  You will be contacted with the lab results as soon as they are available. The fastest way to get your results is to activate your My Chart account. Instructions are located on the last page of this paperwork. If you have not heard from us regarding the results in 2 weeks, please contact this office.     

## 2018-01-24 ENCOUNTER — Ambulatory Visit (INDEPENDENT_AMBULATORY_CARE_PROVIDER_SITE_OTHER): Payer: 59

## 2018-01-24 DIAGNOSIS — Z23 Encounter for immunization: Secondary | ICD-10-CM | POA: Diagnosis not present

## 2018-01-24 NOTE — Progress Notes (Signed)
After obtaining consent, and per orders of Dr. Brigitte Pulse, injection of TDAP given left deltoid IM by Delle Reining. Patient tolerated well.

## 2018-01-25 DIAGNOSIS — M5136 Other intervertebral disc degeneration, lumbar region: Secondary | ICD-10-CM | POA: Diagnosis not present

## 2018-02-25 DIAGNOSIS — M5136 Other intervertebral disc degeneration, lumbar region: Secondary | ICD-10-CM | POA: Diagnosis not present

## 2018-03-21 DIAGNOSIS — N924 Excessive bleeding in the premenopausal period: Secondary | ICD-10-CM | POA: Diagnosis not present

## 2018-03-27 DIAGNOSIS — M5136 Other intervertebral disc degeneration, lumbar region: Secondary | ICD-10-CM | POA: Diagnosis not present

## 2018-04-27 DIAGNOSIS — M5136 Other intervertebral disc degeneration, lumbar region: Secondary | ICD-10-CM | POA: Diagnosis not present

## 2018-05-14 IMAGING — DX DG CHEST 2V
2 series · 2 of 2 positions shown · non-contrast
Comparison: 03/20/16

CLINICAL DATA: Left-sided chest pain

EXAM:
CHEST  2 VIEW

[w chest pa]
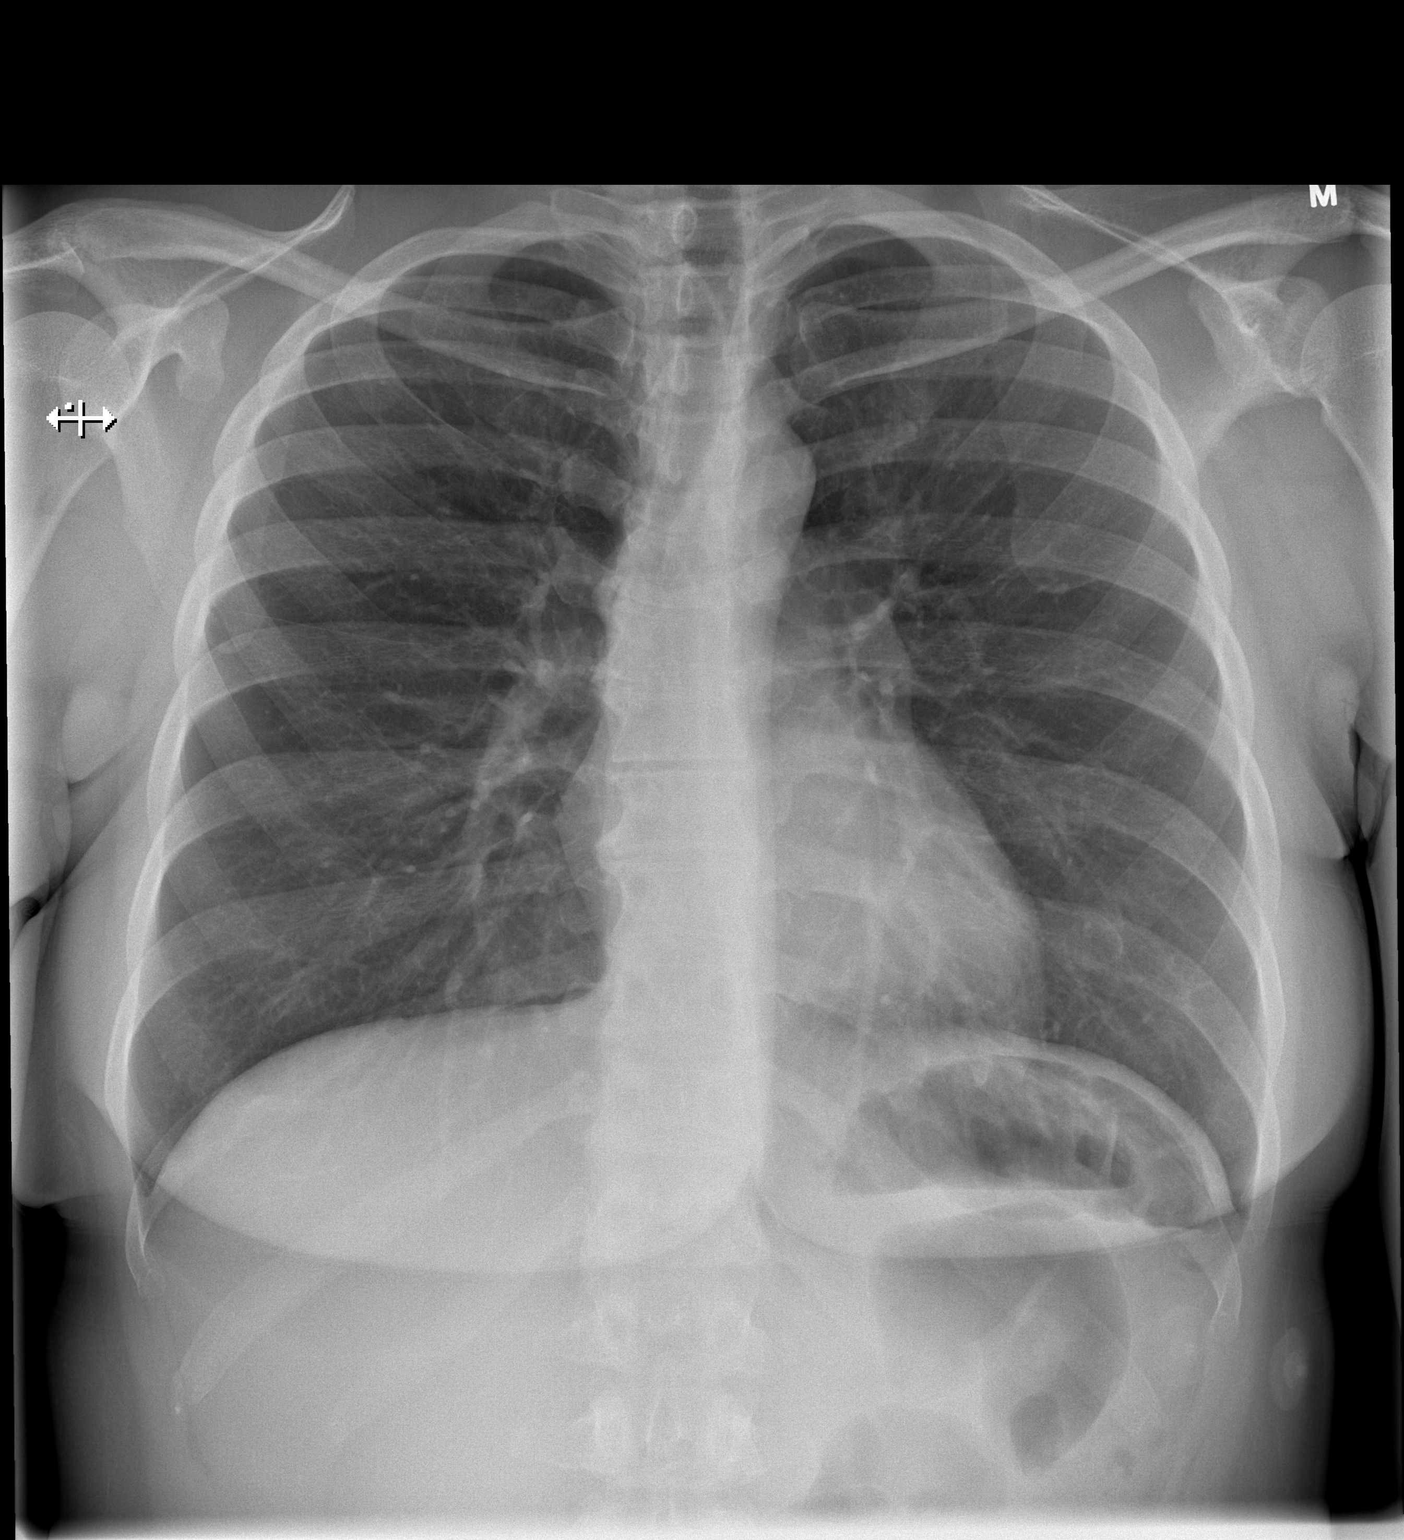

[w chest lat]
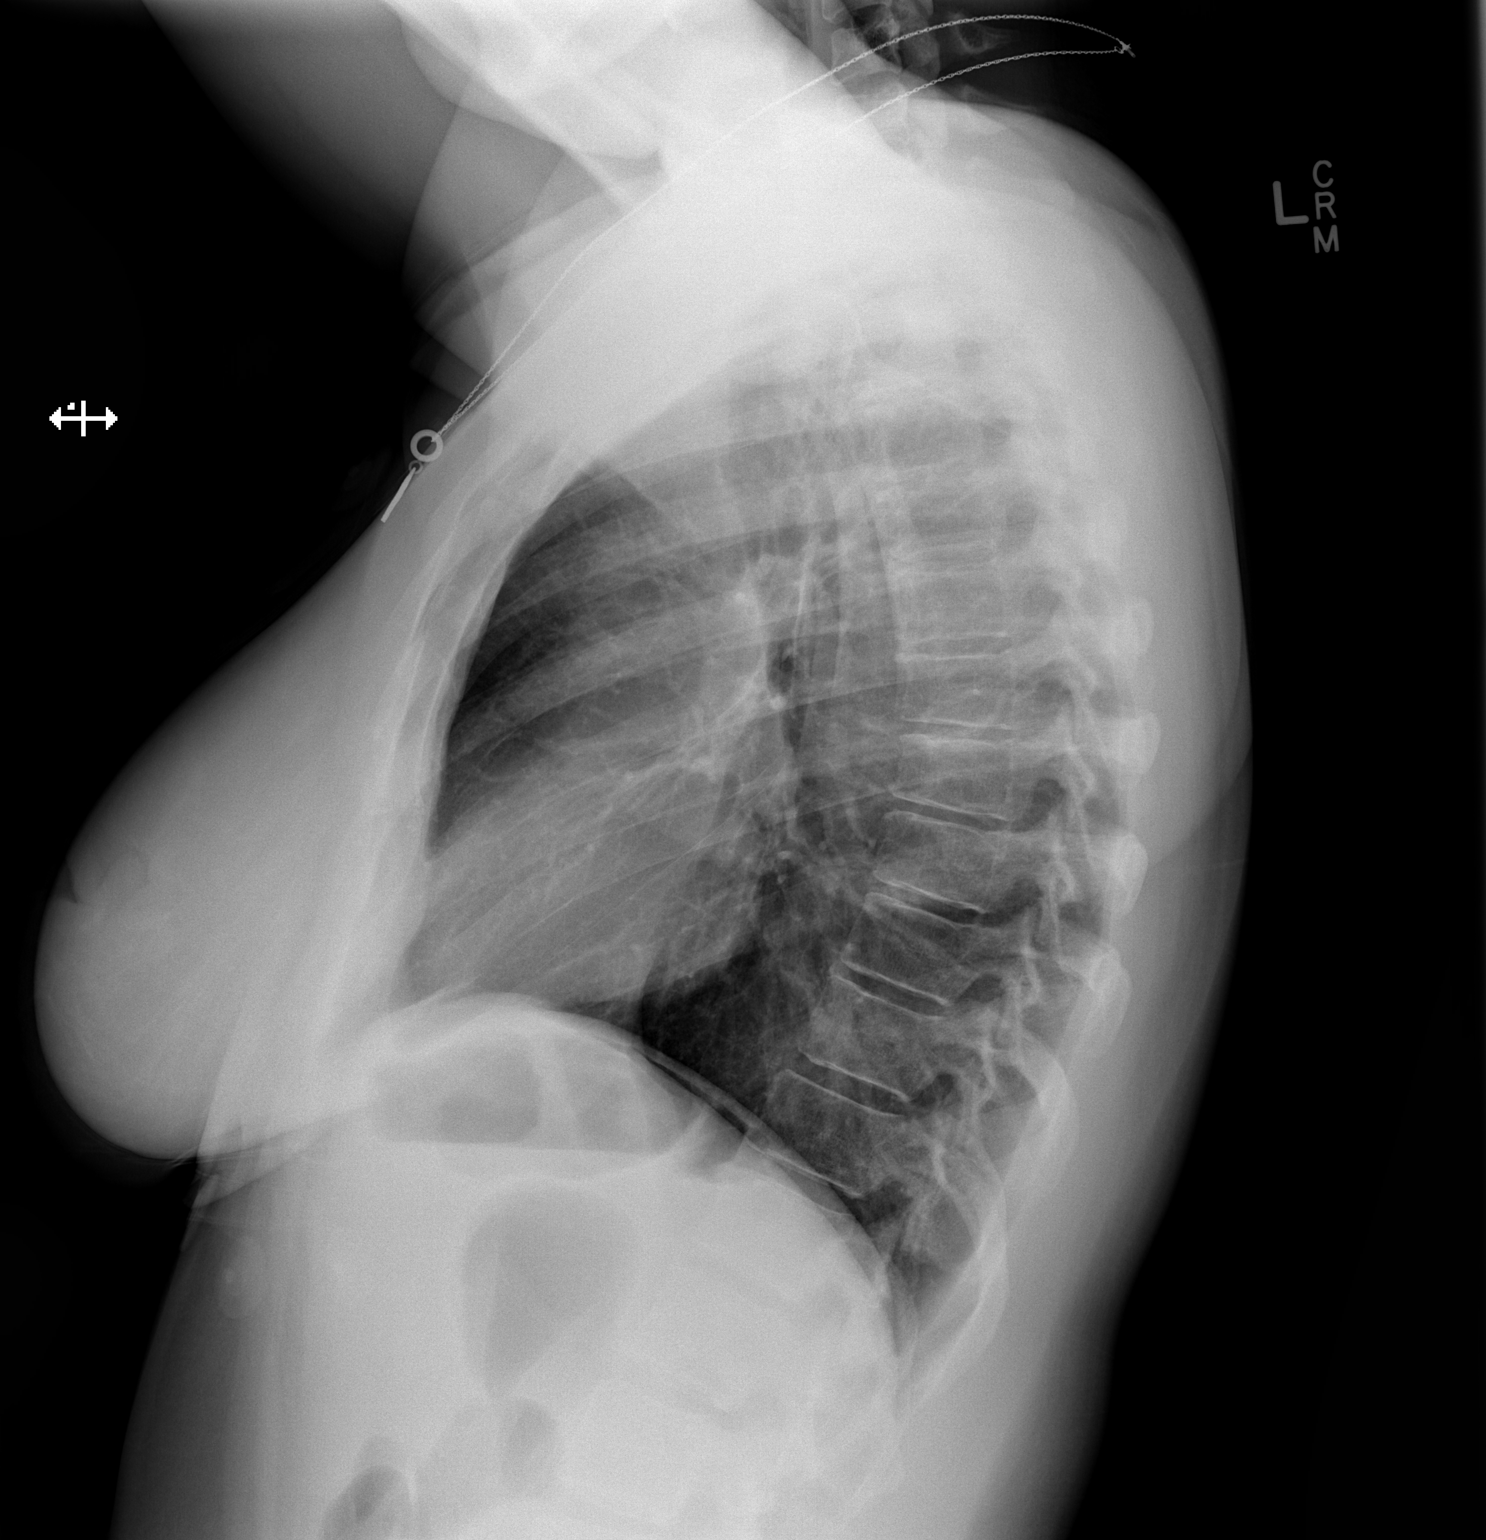

[2 of 2 positions shown; findings below may reference images not displayed]

FINDINGS: The heart size and mediastinal contours are within normal limits.
Both lungs are clear. The visualized skeletal structures are
unremarkable.
IMPRESSION: No active cardiopulmonary disease.

## 2018-05-27 DIAGNOSIS — M5136 Other intervertebral disc degeneration, lumbar region: Secondary | ICD-10-CM | POA: Diagnosis not present

## 2018-06-27 DIAGNOSIS — M5136 Other intervertebral disc degeneration, lumbar region: Secondary | ICD-10-CM | POA: Diagnosis not present

## 2018-07-02 ENCOUNTER — Ambulatory Visit: Payer: 59 | Admitting: Family Medicine

## 2018-07-02 ENCOUNTER — Other Ambulatory Visit: Payer: Self-pay

## 2018-07-02 ENCOUNTER — Encounter: Payer: Self-pay | Admitting: Family Medicine

## 2018-07-02 VITALS — BP 138/82 | HR 70 | Temp 99.0°F | Resp 16 | Ht 63.78 in | Wt 172.0 lb

## 2018-07-02 DIAGNOSIS — R072 Precordial pain: Secondary | ICD-10-CM | POA: Diagnosis not present

## 2018-07-02 DIAGNOSIS — R002 Palpitations: Secondary | ICD-10-CM | POA: Diagnosis not present

## 2018-07-02 DIAGNOSIS — E538 Deficiency of other specified B group vitamins: Secondary | ICD-10-CM

## 2018-07-02 DIAGNOSIS — E559 Vitamin D deficiency, unspecified: Secondary | ICD-10-CM

## 2018-07-02 DIAGNOSIS — Z87898 Personal history of other specified conditions: Secondary | ICD-10-CM

## 2018-07-02 DIAGNOSIS — I10 Essential (primary) hypertension: Secondary | ICD-10-CM

## 2018-07-02 DIAGNOSIS — E611 Iron deficiency: Secondary | ICD-10-CM

## 2018-07-02 MED ORDER — CARVEDILOL 3.125 MG PO TABS: 3.1250 mg | ORAL_TABLET | Freq: Two times a day (BID) | ORAL | 1 refills | Status: DC

## 2018-07-02 MED ORDER — RANITIDINE HCL 150 MG PO TABS: 150.0000 mg | ORAL_TABLET | Freq: Two times a day (BID) | ORAL | 3 refills | Status: DC

## 2018-07-02 NOTE — Patient Instructions (Addendum)
     If you have lab work done today you will be contacted with your lab results within the next 2 weeks.  If you have not heard from [place] then please contact us. The fastest way to get your results is to register for My Chart.   IF you received an x-ray today, you will receive an invoice from [facility]. Please contact [facility] Radiology at [phone] with questions or concerns regarding your invoice.   IF you received labwork today, you will receive an invoice from [place]. Please contact [facility] at [phone] with questions or concerns regarding your invoice.   Our billing staff will not be able to assist you with questions regarding bills from these companies.  You will be contacted with the lab results as soon as they are available. The fastest way to get your results is to activate your My Chart account. Instructions are located on the last page of this paperwork. If you have not heard from [place] regarding the results in 2 weeks, please contact this office.     Palpitations A palpitation is the feeling that your heartbeat is irregular or is faster than normal. It may feel like your heart is fluttering or skipping a beat. Palpitations are usually not a serious problem. They may be caused by many things, including smoking, caffeine, alcohol, stress, and certain medicines. Although most causes of palpitations are not serious, palpitations can be a sign of a serious medical problem. In some cases, you may need further medical evaluation. Follow these instructions at home: Pay attention to any changes in your symptoms. Take these actions to help with your condition:  Avoid the following: ? Caffeinated coffee, tea, soft drinks, diet pills, and energy drinks. ? Chocolate. ? Alcohol.  Do not use any tobacco products, such as cigarettes, chewing tobacco, and e-cigarettes. If you need help quitting, ask your health care provider.  Try to reduce your stress and anxiety. Things  that can help you relax include: ? Yoga. ? Meditation. ? Physical activity, such as swimming, jogging, or walking. ? Biofeedback. This is a method that helps you learn to use your mind to control things in your body, such as your heartbeats.  Get plenty of rest and sleep.  Take over-the-counter and prescription medicines only as told by your health care provider.  Keep all follow-up visits as told by your health care provider. This is important.  Contact a health care provider if:  You continue to have a fast or irregular heartbeat after 24 hours.  Your palpitations occur more often. Get help right away if:  You have chest pain or shortness of breath.  You have a severe headache.  You feel dizzy or you faint. This information is not intended to replace advice given to you by your health care provider. Make sure you discuss any questions you have with your health care provider. Document Released: 10/21/2000 Document Revised: 03/28/2016 Document Reviewed: 07/09/2015 Elsevier Interactive Patient Education  [employer].

## 2018-07-02 NOTE — Progress Notes (Signed)
Subjective:    Patient ID: Brooke Turner, female    DOB: 17-Jul-1976, 42 y.o.   MRN: [id number] Chief Complaint  Patient presents with  . Chronic Conditions    HPI   Has been going to the [employer] a lot more. Does still have palpitations which had subsided but recently more consistent - more with gas - precede a burp. Happening daily but normally for just a few seconds but occ lasts for 20 or 30 secs longer than normal.  Taking same dose of carvedilol.  BP ok - not checking it as much. Work ok.  Still sees therapist every 2 week.  Hasn't been to the ER in a while Occ takes a lorazepam at night but has not needed in a while.  To scared to have cycle due to fibroids - was so bad she was having a miscarriage but it was her regular cycle. Haven't grown that much.   Past Medical History:  Diagnosis Date  . Anxiety   . Back pain   . Chest pain    a. 09/2014 Echo: EF 60-65%, no rwma, nl valves;  b. 12/2014 Neg ETT.  . Diabetes mellitus without complication ([employer])   . Essential hypertension    a. 01/2016 Renal duplex: no RAS- ? renal cyst (renal u/s 4/17 - no cyst, nl study); b. 02/2016 24 hr BP Monitor: Mean SBP ~ 138 with Max of 198. Highest pressures noted in evening hours following placement of cuff.  . Fibroid uterus   . GERD (gastroesophageal reflux disease)   . Palpitations    a. 09/2014 nl event monitor.   Past Surgical History:  Procedure Laterality Date  . WISDOM TOOTH EXTRACTION     Current Outpatient Medications on File Prior to Visit  Medication Sig Dispense Refill  . Biotin 1000 MCG tablet Take 1,000 mcg by mouth daily.    . blood glucose meter kit and supplies Test blood sugar twice daily. Dx code: R73.09 1 each 0  . calcium carbonate (TUMS - DOSED IN MG ELEMENTAL CALCIUM) 500 MG chewable tablet Chew 500 mg by mouth daily as needed for indigestion or heartburn.    . carvedilol (COREG) 3.125 MG tablet Take 1 tablet (3.125 mg total) by mouth 2 (two) times daily  with a meal. 180 tablet 1  . fluticasone (FLONASE) 50 MCG/ACT nasal spray Place 2 sprays into both nostrils daily. 16 g 0  . LORazepam (ATIVAN) 0.5 MG tablet Take 1 tablet (0.5 mg total) by mouth every 6 (six) hours as needed for anxiety. 30 tablet 2  . Norethindrone-Ethinyl Estradiol-Fe Biphas (LO LOESTRIN FE) 1 MG-10 MCG / 10 MCG tablet Take 1 tablet by mouth daily.    [name] DELICA LANCETS 33O MISC USE TO TEST BLOOD SUGAR DAILY 100 each 0  . ONETOUCH VERIO test strip TEST BLOOD SUGAR DAILY 25 each 10  . Prenatal Vit-Fe Fumarate-FA (PRENATAL MULTIVITAMIN) TABS tablet Take 1 tablet by mouth daily at 12 noon.    . progesterone (PROMETRIUM) 200 MG capsule Take 1 capsule (200 mg total) by mouth daily. 10 capsule 0  . ranitidine (ZANTAC) 150 MG tablet TAKE ONE TABLET BY MOUTH TWICE DAILY 60 tablet 11  . [DISCONTINUED] diltiazem (CARDIZEM CD) 120 MG 24 hr capsule Take 1 capsule (120 mg total) by mouth daily. 30 capsule 11   No current facility-administered medications on file prior to visit.    Allergies  Allergen Reactions  . Penicillins Hives    Has patient had  a PCN reaction causing immediate rash, facial/tongue/throat swelling, SOB or lightheadedness with hypotension:Yes Has patient had a PCN reaction causing severe rash involving mucus membranes or skin necrosis:unsure Has patient had a PCN reaction that required hospitalization: Yes Has patient had a PCN reaction occurring within the last 10 years: No If all of the above answers are "NO", then may proceed with Cephalosporin use.     . Diltiazem     Dizzy, felt like she was going to pass out Blood pressure went up per patient   . Tamiflu [Oseltamivir Phosphate] Nausea And Vomiting    Excessive vomiting  . Tinidazole     Caused severe abd pain/type of colitis    Family History  Problem Relation Age of Onset  . Seizures Mother   . Cancer Father        Liver  . Diabetes Father 3  . Hypertension Father   . Heart disease  Father 55       CEA, LE [place]  . Alzheimer's disease Maternal Grandmother   . Heart disease Maternal Grandfather    Social History   Socioeconomic History  . Marital status: Single    Spouse name: N/A  . Number of children: 0  . Years of education: 15  . Highest education level: Not on file  Occupational History  . Occupation: Research scientist (physical sciences): [employer]  Social Needs  . Financial resource strain: Not on file  . Food insecurity:    Worry: Not on file    Inability: Not on file  . Transportation needs:    Medical: Not on file    Non-medical: Not on file  Tobacco Use  . Smoking status: Former Smoker    Packs/day: 0.25    Years: 10.00    Pack years: 2.50    Types: Cigarettes    Last attempt to quit: 08/19/2014    Years since quitting: 3.8  . Smokeless tobacco: Never Used  Substance and Sexual Activity  . Alcohol use: No    Alcohol/week: 0.0 standard drinks  . Drug use: No  . Sexual activity: Not Currently    Partners: Male    Birth control/protection: Pill  Lifestyle  . Physical activity:    Days per week: Not on file    Minutes per session: Not on file  . Stress: Not on file  Relationships  . Social connections:    Talks on phone: Not on file    Gets together: Not on file    Attends religious service: Not on file    Active member of club or organization: Not on file    Attends meetings of clubs or organizations: Not on file    Relationship status: Not on file  Other Topics Concern  . Not on file  Social History Narrative   Patient lives at home alone .   Patient works full time at [employer]   Education college.   Right handed.   Caffeine none   Depression screen [facility] 2/9 07/02/2018 01/18/2018 10/10/2017 07/05/2017 01/07/2017  Decreased Interest 0 0 0 0 0  Down, Depressed, Hopeless 0 0 0 0 0  PHQ - 2 Score 0 0 0 0 0  Some recent data might be hidden   Review of Systems See hpi    Objective:   Physical Exam  Constitutional: She is  oriented to person, place, and time. She appears well-developed and well-nourished. No distress.  HENT:  Head: Normocephalic and atraumatic.  Right Ear: External ear  normal.  Left Ear: External ear normal.  Eyes: Conjunctivae are normal. No scleral icterus.  Neck: Normal range of motion. Neck supple. No thyromegaly present.  Cardiovascular: Normal rate, regular rhythm, normal heart sounds and intact distal pulses.  Pulmonary/Chest: Effort normal and breath sounds normal. No respiratory distress.  Musculoskeletal: She exhibits no edema.  Lymphadenopathy:    She has no cervical adenopathy.  Neurological: She is alert and oriented to person, place, and time.  Skin: Skin is warm and dry. She is not diaphoretic. No erythema.  Psychiatric: She has a normal mood and affect. Her behavior is normal.   BP 138/82   Pulse 70   Temp 99 F (37.2 C) (Oral)   Resp 16   Ht 5' 3.78" (1.62 m)   Wt 172 lb (78 kg)   SpO2 100%   BMI 29.73 kg/m      Assessment & Plan:   1. Essential hypertension   2. Palpitations   3. Precordial chest pain   4. Iron deficiency   5. History of prediabetes   6. Vitamin D deficiency   7. Low vitamin B12 level     Orders Placed This Encounter  Procedures  . Comprehensive metabolic panel  . CBC  . TSH+T4F+T3Free  . Iron, TIBC and Ferritin Panel  . Hemoglobin A1c  . Vitamin B12  . VITAMIN D 25 Hydroxy (Vit-D Deficiency, Fractures)  . Holter monitor - 72 hour    Standing Status:   Future    Standing Expiration Date:   07/02/2028    Order Specific Question:   Where should this test be performed?    Answer:   [facility]    Meds ordered this encounter  Medications  . carvedilol (COREG) 3.125 MG tablet    Sig: Take 1 tablet (3.125 mg total) by mouth 2 (two) times daily with a meal.    Dispense:  180 tablet    Refill:  1  . ranitidine (ZANTAC) 150 MG tablet    Sig: Take 1 tablet (150 mg total) by mouth 2 (two) times daily.    Dispense:  180 tablet     Refill:  3    Please consider 90 day supplies to promote better adherence     [name], M.D.  Primary Care at [facility] [address] [place],  [address] [phone] phone [phone] fax  07/02/18 6:35 PM

## 2018-07-03 LAB — COMPREHENSIVE METABOLIC PANEL
ALT: 18 IU/L (ref 0–32)
AST: 21 IU/L (ref 0–40)
Albumin/Globulin Ratio: 1.7 (ref 1.2–2.2)
Albumin: 4.3 g/dL (ref 3.5–5.5)
Alkaline Phosphatase: 54 IU/L (ref 39–117)
BUN/Creatinine Ratio: 12 (ref 9–23)
BUN: 12 mg/dL (ref 6–24)
Bilirubin Total: 0.9 mg/dL (ref 0.0–1.2)
CO2: 21 mmol/L (ref 20–29)
Calcium: 8.9 mg/dL (ref 8.7–10.2)
Chloride: 102 mmol/L (ref 96–106)
Creatinine, Ser: 1.04 mg/dL — ABNORMAL HIGH (ref 0.57–1.00)
GFR calc Af Amer: 76 mL/min/{1.73_m2} (ref >59)
GFR calc non Af Amer: 66 mL/min/{1.73_m2} (ref >59)
Globulin, Total: 2.6 g/dL (ref 1.5–4.5)
Glucose: 89 mg/dL (ref 65–99)
Potassium: 4.7 mmol/L (ref 3.5–5.2)
Sodium: 139 mmol/L (ref 134–144)
Total Protein: 6.9 g/dL (ref 6.0–8.5)

## 2018-07-03 LAB — TSH+T4F+T3FREE
Free T4: 1.19 ng/dL (ref 0.82–1.77)
T3, Free: 3.1 pg/mL (ref 2.0–4.4)
TSH: 0.983 u[IU]/mL (ref 0.450–4.500)

## 2018-07-03 LAB — CBC
Hematocrit: 42.8 % (ref 34.0–46.6)
Hemoglobin: 14 g/dL (ref 11.1–15.9)
MCH: 30.1 pg (ref 26.6–33.0)
MCHC: 32.7 g/dL (ref 31.5–35.7)
MCV: 92 fL (ref 79–97)
Platelets: 291 10*3/uL (ref 150–450)
RBC: 4.65 x10E6/uL (ref 3.77–5.28)
RDW: 13.1 % (ref 12.3–15.4)
WBC: 6.1 10*3/uL (ref 3.4–10.8)

## 2018-07-03 LAB — IRON,TIBC AND FERRITIN PANEL
Ferritin: 13 ng/mL — ABNORMAL LOW (ref 15–150)
Iron Saturation: 26 % (ref 15–55)
Iron: 121 ug/dL (ref 27–159)
Total Iron Binding Capacity: 457 ug/dL — ABNORMAL HIGH (ref 250–450)
UIBC: 336 ug/dL (ref 131–425)

## 2018-07-03 LAB — VITAMIN D 25 HYDROXY (VIT D DEFICIENCY, FRACTURES): Vit D, 25-Hydroxy: 25.6 ng/mL — ABNORMAL LOW (ref 30.0–100.0)

## 2018-07-03 LAB — HEMOGLOBIN A1C
Est. average glucose Bld gHb Est-mCnc: 120 mg/dL
Hgb A1c MFr Bld: 5.8 % — ABNORMAL HIGH (ref 4.8–5.6)

## 2018-07-03 LAB — VITAMIN B12: Vitamin B-12: 338 pg/mL (ref 232–1245)

## 2018-07-06 DIAGNOSIS — S8992XA Unspecified injury of left lower leg, initial encounter: Secondary | ICD-10-CM | POA: Diagnosis not present

## 2018-07-06 DIAGNOSIS — M79606 Pain in leg, unspecified: Secondary | ICD-10-CM | POA: Diagnosis not present

## 2018-07-06 DIAGNOSIS — S8002XA Contusion of left knee, initial encounter: Secondary | ICD-10-CM | POA: Diagnosis not present

## 2018-07-06 DIAGNOSIS — W228XXA Striking against or struck by other objects, initial encounter: Secondary | ICD-10-CM | POA: Diagnosis not present

## 2018-07-12 DIAGNOSIS — Z0271 Encounter for disability determination: Secondary | ICD-10-CM

## 2018-07-17 ENCOUNTER — Telehealth: Payer: Self-pay | Admitting: Family Medicine

## 2018-07-17 NOTE — Telephone Encounter (Signed)
Called pt to reschedule visit with dr. Brigitte Pulse. Left Detailed vm.   If pt. Calls back please reschedule 07/30/18 visit

## 2018-07-28 DIAGNOSIS — M5136 Other intervertebral disc degeneration, lumbar region: Secondary | ICD-10-CM | POA: Diagnosis not present

## 2018-07-30 ENCOUNTER — Ambulatory Visit: Payer: 59 | Admitting: Family Medicine

## 2018-08-06 ENCOUNTER — Encounter: Payer: Self-pay | Admitting: Family Medicine

## 2018-08-06 ENCOUNTER — Ambulatory Visit: Payer: 59 | Admitting: Family Medicine

## 2018-08-06 VITALS — BP 149/80 | HR 70 | Temp 97.9°F | Resp 16 | Ht 63.0 in | Wt 176.0 lb

## 2018-08-06 DIAGNOSIS — R21 Rash and other nonspecific skin eruption: Secondary | ICD-10-CM | POA: Diagnosis not present

## 2018-08-06 DIAGNOSIS — R110 Nausea: Secondary | ICD-10-CM | POA: Diagnosis not present

## 2018-08-06 DIAGNOSIS — N912 Amenorrhea, unspecified: Secondary | ICD-10-CM

## 2018-08-06 DIAGNOSIS — Z3200 Encounter for pregnancy test, result unknown: Secondary | ICD-10-CM

## 2018-08-06 LAB — POCT SKIN KOH: Skin KOH, POC: NEGATIVE

## 2018-08-06 LAB — POCT URINE PREGNANCY: Preg Test, Ur: NEGATIVE

## 2018-08-06 MED ORDER — TRIAMCINOLONE ACETONIDE 0.1 % EX CREA: 1.0000 "application " | TOPICAL_CREAM | Freq: Two times a day (BID) | CUTANEOUS | 0 refills | Status: DC

## 2018-08-06 NOTE — Progress Notes (Signed)
Patient ID: Brooke Turner, female    DOB: 06-30-1976  Age: 42 y.o. MRN: [id number]  Chief Complaint  Patient presents with  . Rash    on neck, itchy, x 2 wks, starting to go away but still wants to know what it was  . Possible Pregnancy    abd cramps at times    Subjective:   Patient is here with 2 problems.  She has a rash on her neck which is gotten gradually more pronounced.  She is tried various OTC medications including Neosporin and various ointments and creams.  It is gotten gradually more pigmented and it always itches a little bit.  She also is concerned because she has had nausea.  She wants to know if there is any chance that she is pregnant.  She had intercourse last right before Labor Day.  Current allergies, medications, problem list, past/family and social histories reviewed.  Objective:  BP (!) 149/80   Pulse 70   Temp 97.9 F (36.6 C) (Oral)   Resp 16   Ht 5\' 3"  (1.6 m)   Wt 176 lb (79.8 kg)   LMP  (LMP Unknown)   SpO2 100%   BMI 31.18 kg/m   No major acute distress.  Is a very mildly pigmented rash under the right side of her neck from about the Adam's apple to the clavicle.  There is a little bump on the left side and a skin fold which just came up today apparently.  Urine pregnancy test is negative but patient strongly would like a serum pregnancy test done to be certain.  Explained to her that the urine should be positive by this point, especially if she is having any symptoms.  Assessment & Plan:   Assessment: 1. Rash and nonspecific skin eruption   2. Amenorrhea   3. Possible pregnancy   4. Nausea without vomiting       Plan: I examined the skin scraping and do not see any fungus  We will go ahead and get the urine pregnancy test per her request.  See instructions  Orders Placed This Encounter  Procedures  . hCG, serum, qualitative  . POCT urine pregnancy  . POCT Skin KOH    No orders of the defined types were placed in this  encounter.        Patient Instructions   No fungus was seen on the skin scraping.  Prescribing some triamcinolone cream to use twice daily on the rash.  This is a cortisone based cream.  If it does not take care of it, can refer you to a dermatologist.  We will t ry to let you know the results of your blood work in a few days.  If the nausea persists please return.  In the meanwhile, recommend just taking some over-the-counter stomach acid suppressing medication such as Zantac (ranitidine) 150 mg daily.  Activity such as gym workouts do not harm a normal pregnancy, in fact are good for you.  If you ever have bleeding and are pregnant, then you should not exercise until you are seen by your obstetrician.  If you are not planning to be pregnant, always use contraception.  Other than abstinance, condoms are the best method to avoid STDs.    If you have lab work done today you will be contacted with your lab results within the next 2 weeks.  If you have not heard from [place] then please contact us. The fastest way to get your results is  to register for My Chart.   IF you received an x-ray today, you will receive an invoice from [facility] Radiology. Please contact [facility] Radiology at [phone] with questions or concerns regarding your invoice.   IF you received labwork today, you will receive an invoice from [place]. Please contact [facility] at [phone] with questions or concerns regarding your invoice.   Our billing staff will not be able to assist you with questions regarding bills from these companies.  You will be contacted with the lab results as soon as they are available. The fastest way to get your results is to activate your My Chart account. Instructions are located on the last page of this paperwork. If you have not heard from [place] regarding the results in 2 weeks, please contact this office.          No follow-ups on file.   Ruben Reason, MD 08/06/2018

## 2018-08-06 NOTE — Patient Instructions (Addendum)
No fungus was seen on the skin scraping.  Prescribing some triamcinolone cream to use twice daily on the rash.  This is a cortisone based cream.  If it does not take care of it, can refer you to a dermatologist.  We will t ry to let you know the results of your blood work in a few days.  If the nausea persists please return.  In the meanwhile, recommend just taking some over-the-counter stomach acid suppressing medication such as Zantac (ranitidine) 150 mg daily.  Activity such as gym workouts do not harm a normal pregnancy, in fact are good for you.  If you ever have bleeding and are pregnant, then you should not exercise until you are seen by your obstetrician.  If you are not planning to be pregnant, always use contraception.  Other than abstinance, condoms are the best method to avoid STDs.    If you have lab work done today you will be contacted with your lab results within the next 2 weeks.  If you have not heard from [place] then please contact us. The fastest way to get your results is to register for My Chart.   IF you received an x-ray today, you will receive an invoice from [facility] Radiology. Please contact [facility] Radiology at [phone] with questions or concerns regarding your invoice.   IF you received labwork today, you will receive an invoice from [place]. Please contact [facility] at [phone] with questions or concerns regarding your invoice.   Our billing staff will not be able to assist you with questions regarding bills from these companies.  You will be contacted with the lab results as soon as they are available. The fastest way to get your results is to activate your My Chart account. Instructions are located on the last page of this paperwork. If you have not heard from [place] regarding the results in 2 weeks, please contact this office.

## 2018-08-07 LAB — HCG, SERUM, QUALITATIVE: hCG,Beta Subunit,Qual,Serum: NEGATIVE m[IU]/mL (ref <6)

## 2018-08-18 DIAGNOSIS — R110 Nausea: Secondary | ICD-10-CM | POA: Diagnosis not present

## 2018-08-18 DIAGNOSIS — R002 Palpitations: Secondary | ICD-10-CM | POA: Diagnosis not present

## 2018-08-18 DIAGNOSIS — R0789 Other chest pain: Secondary | ICD-10-CM | POA: Diagnosis not present

## 2018-08-18 DIAGNOSIS — R000 Tachycardia, unspecified: Secondary | ICD-10-CM | POA: Diagnosis not present

## 2018-08-27 DIAGNOSIS — M5136 Other intervertebral disc degeneration, lumbar region: Secondary | ICD-10-CM | POA: Diagnosis not present

## 2018-08-31 ENCOUNTER — Encounter: Payer: Self-pay | Admitting: Physician Assistant

## 2018-09-11 ENCOUNTER — Emergency Department (HOSPITAL_COMMUNITY): Payer: 59

## 2018-09-11 ENCOUNTER — Emergency Department (HOSPITAL_COMMUNITY)
Admission: EM | Admit: 2018-09-11 | Discharge: 2018-09-12 | Disposition: A | Discharge status: Home / Self Care | Payer: 59 | Attending: Emergency Medicine | Admitting: Emergency Medicine

## 2018-09-11 ENCOUNTER — Encounter (HOSPITAL_COMMUNITY): Payer: Self-pay | Admitting: Emergency Medicine

## 2018-09-11 ENCOUNTER — Ambulatory Visit: Payer: Self-pay

## 2018-09-11 DIAGNOSIS — R0602 Shortness of breath: Secondary | ICD-10-CM | POA: Diagnosis not present

## 2018-09-11 DIAGNOSIS — Z79899 Other long term (current) drug therapy: Secondary | ICD-10-CM | POA: Insufficient documentation

## 2018-09-11 DIAGNOSIS — M25562 Pain in left knee: Secondary | ICD-10-CM | POA: Diagnosis not present

## 2018-09-11 DIAGNOSIS — I10 Essential (primary) hypertension: Secondary | ICD-10-CM | POA: Diagnosis not present

## 2018-09-11 DIAGNOSIS — Z87891 Personal history of nicotine dependence: Secondary | ICD-10-CM | POA: Diagnosis not present

## 2018-09-11 DIAGNOSIS — R079 Chest pain, unspecified: Secondary | ICD-10-CM | POA: Insufficient documentation

## 2018-09-11 DIAGNOSIS — E119 Type 2 diabetes mellitus without complications: Secondary | ICD-10-CM | POA: Diagnosis not present

## 2018-09-11 DIAGNOSIS — M79662 Pain in left lower leg: Secondary | ICD-10-CM | POA: Diagnosis not present

## 2018-09-11 LAB — CBC
HCT: 42.9 % (ref 36.0–46.0)
Hemoglobin: 14.3 g/dL (ref 12.0–15.0)
MCH: 30.4 pg (ref 26.0–34.0)
MCHC: 33.3 g/dL (ref 30.0–36.0)
MCV: 91.1 fL (ref 80.0–100.0)
Platelets: 260 10*3/uL (ref 150–400)
RBC: 4.71 MIL/uL (ref 3.87–5.11)
RDW: 12 % (ref 11.5–15.5)
WBC: 8.6 10*3/uL (ref 4.0–10.5)
nRBC: 0 % (ref 0.0–0.2)

## 2018-09-11 LAB — BASIC METABOLIC PANEL
Anion gap: 11 (ref 5–15)
BUN: 14 mg/dL (ref 6–20)
CO2: 22 mmol/L (ref 22–32)
Calcium: 9 mg/dL (ref 8.9–10.3)
Chloride: 104 mmol/L (ref 98–111)
Creatinine, Ser: 1.19 mg/dL — ABNORMAL HIGH (ref 0.44–1.00)
GFR calc Af Amer: >60 mL/min (ref >60)
GFR calc non Af Amer: 55 mL/min — ABNORMAL LOW (ref >60)
Glucose, Bld: 100 mg/dL — ABNORMAL HIGH (ref 70–99)
Potassium: 3.7 mmol/L (ref 3.5–5.1)
Sodium: 137 mmol/L (ref 135–145)

## 2018-09-11 LAB — I-STAT TROPONIN, ED: Troponin i, poc: 0 ng/mL (ref 0.00–0.08)

## 2018-09-11 LAB — I-STAT BETA HCG BLOOD, ED (MC, WL, AP ONLY): I-stat hCG, quantitative: <5 m[IU]/mL (ref <5)

## 2018-09-11 MED ORDER — IOPAMIDOL (ISOVUE-370) INJECTION 76%
INTRAVENOUS | Status: AC
  Filled 2018-09-11: qty 100

## 2018-09-11 MED ORDER — IOPAMIDOL (ISOVUE-370) INJECTION 76%
100.0000 mL | Freq: Once | INTRAVENOUS | Status: AC | PRN
  Administered 2018-09-11: 100 mL via INTRAVENOUS

## 2018-09-11 NOTE — ED Triage Notes (Signed)
Pt presents with L knee pain continued after initial injury in sept 2019; pt then has had multiple car trips to [place]; now having pain to posterior calf and thigh of same leg; pt also with some chest tightness and sob

## 2018-09-11 NOTE — Telephone Encounter (Signed)
Pt called with C/O swollen knee cap left knee. Pt states that she injured her knee labor day and was seen in the ED.  She was told to stop exercising for a few weeks.  She has recently been working out and has increased the weight she is using.  She now has swelling around the upper area of the knee cap. She states it is like a lump.  She has pain to her calf and upper thigh. She rates her pain at 6. I was unable to schedule appointment with any provider at [facility] in the next 24 hours.. Pt instructed to go to urgent care for follow up. Care advice read to patient. Patient verbalized understanding of all instructions.   Reason for Disposition . A "snap" or "pop" was heard at the time of injury  Answer Assessment - Initial Assessment Questions 1. MECHANISM: "How did the injury happen?" (e.g., twisting injury, direct blow)      Blow to left knee Ran it into a wall. 2. ONSET: "When did the injury happen?" (Minutes or hours ago)    Over labor day Seen in ED.Xray report per patient No FX. 3. LOCATION: "Where is the injury located?"      Left knee 4. APPEARANCE of INJURY: "What does the injury look like?"     Knot swollen knee 5. SEVERITY: "Can you put weight on that leg?" "Can you walk?"      Can walk burns with a lung exercise movement 6. SIZE: For cuts, bruises, or swelling, ask: "How large is it?" (e.g., inches or centimeters;  entire joint)      Big bump above the knee thigh pain with it and calf pain 7. PAIN: "Is there pain?" If so, ask: "How bad is the pain?"    (e.g., Scale 1-10; or mild, moderate, severe)     6 8. TETANUS: For any breaks in the skin, ask: "When was the last tetanus booster?"     Seen at ED not needed 9. OTHER SYMPTOMS: "Do you have any other symptoms?"  (e.g., "pop" when knee injured, swelling, locking, buckling)      Knee cap is swollen outer and above knee  10. PREGNANCY: "Is there any chance you are pregnant?" "When was your last menstrual period?"       No on birth  control  Protocols used: [place]

## 2018-09-11 NOTE — ED Provider Notes (Signed)
Patient placed in Quick Look pathway, seen and evaluated   Chief Complaint: left leg pain, shortness of breath  HPI: Brooke Turner is a 42 y.o. female who presents to the ED with c/o L knee pain continued after initial injury in sept 2019. Patient reports having multiple car trips to [place]; now having pain to posterior calf and thigh of same leg; pt also with some chest tightness and sob.   ROS: M/S: left knee pain  Physical Exam:  BP 140/72   Pulse 66   Temp 98.8 F (37.1 C) (Oral)   Resp 18   Ht 5\' 4"  (1.626 m)   Wt 75.3 kg   SpO2 100%   BMI 28.49 kg/m    Gen: No distress  Neuro: Awake and Alert   Skin: warm and dry  M/S: left knee tenderness  Initiation of care has begun. The patient has been counseled on the process, plan, and necessity for staying for the completion/evaluation, and the remainder of the medical screening examination    Ashley Murrain, NP 09/24/18 Rossie, Julie, MD 09/27/18 [phone]

## 2018-09-11 NOTE — ED Notes (Signed)
Pt wanting doppler study on L leg for DVT r/o (what the UC provider sent her here for)

## 2018-09-12 ENCOUNTER — Other Ambulatory Visit (HOSPITAL_COMMUNITY): Payer: Self-pay | Admitting: Student

## 2018-09-12 ENCOUNTER — Ambulatory Visit (HOSPITAL_BASED_OUTPATIENT_CLINIC_OR_DEPARTMENT_OTHER)
Admission: RE | Admit: 2018-09-12 | Discharge: 2018-09-12 | Disposition: A | Discharge status: Home / Self Care | Payer: 59 | Source: Ambulatory Visit | Attending: Student | Admitting: Student

## 2018-09-12 DIAGNOSIS — R609 Edema, unspecified: Secondary | ICD-10-CM

## 2018-09-12 LAB — I-STAT TROPONIN, ED: Troponin i, poc: 0 ng/mL (ref 0.00–0.08)

## 2018-09-12 LAB — D-DIMER, QUANTITATIVE (NOT AT ARMC): D-Dimer, Quant: 2.71 ug/mL-FEU — ABNORMAL HIGH (ref 0.00–0.50)

## 2018-09-12 MED ORDER — LIDOCAINE 5 % EX PTCH
1.0000 | MEDICATED_PATCH | CUTANEOUS | Status: DC
  Administered 2018-09-12: 1 via TRANSDERMAL
  Filled 2018-09-12: qty 1

## 2018-09-12 MED ORDER — ENOXAPARIN SODIUM 80 MG/0.8ML ~~LOC~~ SOLN
1.0000 mg/kg | Freq: Once | SUBCUTANEOUS | Status: AC
  Administered 2018-09-12: 75 mg via SUBCUTANEOUS
  Filled 2018-09-12: qty 0.75

## 2018-09-12 NOTE — Discharge Instructions (Addendum)
Your work-up has been reassuring in terms of your heart.  Your d-dimer is mildly elevated you do need ultrasound tomorrow morning.  I would suggest you following up with your primary care doctor for your chest pain or shortness of breath.  Return to the ED with any worsening symptoms.    IMPORTANT PATIENT INSTRUCTIONS:  You have been scheduled for an Outpatient Vascular Study at [facility].    If tomorrow is a Saturday, Sunday or holiday, please go to the [facility] Emergency Department Registration Desk at 8 am tomorrow morning and tell them you are there for a vascular study.  If tomorrow is a weekday (Monday-Friday), please go to [facility] Admitting Department at 8 am and tell them  you are  there for a vascular study.

## 2018-09-12 NOTE — ED Provider Notes (Signed)
[facility] EMERGENCY DEPARTMENT Provider Note   CSN: [id number] Arrival date & time: 09/11/18  1949     History   Chief Complaint Chief Complaint  Patient presents with  . Knee Pain  . Chest Pain  . Shortness of Breath    HPI Brooke Turner is a 42 y.o. female.  HPI 42 year old female past medical history significant for diabetes, hypertension, anxiety presents to the emergency department today after being referred by urgent care for left leg pain and swelling.  Patient states that she injured her left knee in September 2019.  She states that after that she had multiple car trips to [place].  She reports over the past week she is been having worsening calf pain and left thigh pain.  She reports some swelling to her left knee.  Patient also reports some intermittent chest tightness and shortness of breath.  These have been ongoing for several weeks to months.  The chest pain and shortness of breath but not always associated.  States that it is sharp pain in the left side of her chest that does not radiate.  Denies any diaphoresis, nausea or emesis.  Denies any exertional chest pain or shortness of breath.  These will resolve on their own.  The patient went to urgent care who sent patient to the ED for evaluation of possible DVT.  Patient is on oral birth control.  She has a remote history of smoking.  Denies any cardiac history but does report family history.  Patient denies any recent injury.  She has not been taken anything for the pain prior to arrival.  Nothing makes better or worse.  She states that she is active and works out regularly.  States that her last trip to [place] she ran a 5K and then she started having some pain to her left knee.  Pt denies any fever, chill, ha, vision changes, lightheadedness, dizziness, congestion, neck pain,cough, abd pain, n/v/d, urinary symptoms, change in bowel habits, melena, hematochezia, lower extremity  paresthesias.  Past Medical History:  Diagnosis Date  . Anxiety   . Back pain   . Chest pain    a. 09/2014 Echo: EF 60-65%, no rwma, nl valves;  b. 12/2014 Neg ETT.  . Diabetes mellitus without complication ([place])   . Essential hypertension    a. 01/2016 Renal duplex: no RAS- ? renal cyst (renal u/s 4/17 - no cyst, nl study); b. 02/2016 24 hr BP Monitor: Mean SBP ~ 138 with Max of 198. Highest pressures noted in evening hours following placement of cuff.  . Fibroid uterus   . GERD (gastroesophageal reflux disease)   . Palpitations    a. 09/2014 nl event monitor.    Patient Active Problem List   Diagnosis Date Noted  . Left facial pain 09/20/2016  . Head trauma 09/20/2016  . Palpitations   . Chest pain   . Left sided numbness 06/11/2015  . Chronic tension-type headache, not intractable 06/10/2015  . Dyspnea 03/18/2015  . Post-traumatic stress reaction 02/25/2015  . Anxiety disorder due to general medical condition with panic attack 02/25/2015  . Subserous leiomyoma of uterus 02/25/2015  . Anxiety about health 02/25/2015  . Deviated nasal septum 12/30/2014  . Chronic rhinitis 12/30/2014  . Chronic maxillary sinusitis 12/30/2014  . Snorings 09/30/2014  . Fatigue 09/26/2014  . Dizziness and giddiness 09/11/2014  . Tachycardia 09/11/2014  . Precordial chest pain 09/11/2014  . Prediabetes 08/26/2014  . Acanthosis nigricans 03/19/2014  .  Vitamin D deficiency 07/15/2013  . Large breasts 09/12/2012  . Essential hypertension 02/17/2012  . Contraception management 02/17/2012  . Obesity (BMI 30-39.9) previous BMI was over 40. Patient has lost 70 pounds in the last year 02/17/2012  . Anxiety disorder 02/17/2012  . Chronic back pain 02/17/2012    Past Surgical History:  Procedure Laterality Date  . WISDOM TOOTH EXTRACTION       OB History    Gravida  2   Para  0   Term  0   Preterm  0   AB  2   Living  0     SAB  1   TAB  1   Ectopic  0   Multiple  0   Live  Births               Home Medications    Prior to Admission medications   Medication Sig Start Date End Date Taking? Authorizing Provider  carvedilol (COREG) 3.125 MG tablet Take 1 tablet (3.125 mg total) by mouth 2 (two) times daily with a meal. 07/02/18  Yes [name], MD  fluticasone [facility]) 50 MCG/ACT nasal spray Place 2 sprays into both nostrils daily. 10/10/17  Yes Timmothy Euler, [place] D, PA-C  LORazepam (ATIVAN) 0.5 MG tablet Take 1 tablet (0.5 mg total) by mouth every 6 (six) hours as needed for anxiety. 01/18/18  Yes [name], MD  Norethindrone-Ethinyl Estradiol-Fe Biphas (LO LOESTRIN FE) 1 MG-10 MCG / 10 MCG tablet Take 1 tablet by mouth daily.   Yes [provider]  Prenatal Vit-Fe Fumarate-FA (PRENATAL MULTIVITAMIN) TABS tablet Take 1 tablet by mouth daily.    Yes [provider]  ranitidine (ZANTAC) 150 MG tablet Take 1 tablet (150 mg total) by mouth 2 (two) times daily. 07/02/18  Yes [name], MD  blood glucose meter kit and supplies Test blood sugar twice daily. Dx code: R73.09 07/22/15   [name], MD  [facility] DELICA LANCETS 93T MISC USE TO TEST BLOOD SUGAR DAILY 10/17/16   [name], MD  [facility] VERIO test strip TEST BLOOD SUGAR DAILY 07/19/16   [name], MD  triamcinolone cream (KENALOG) 0.1 % Apply 1 application topically 2 (two) times daily. Patient not taking: Reported on 09/12/2018 08/06/18   Posey Boyer, MD  diltiazem (CARDIZEM CD) 120 MG 24 hr capsule Take 1 capsule (120 mg total) by mouth daily. 03/31/16 04/12/16  Theora Gianotti, NP    Family History Family History  Problem Relation Age of Onset  . Seizures Mother   . Cancer Father        Liver  . Diabetes Father 50  . Hypertension Father   . Heart disease Father 66       CEA, LE [place]  . Alzheimer's disease Maternal Grandmother   . Heart disease Maternal Grandfather     Social History Social History   Tobacco Use  . Smoking status: Former Smoker     Packs/day: 0.25    Years: 10.00    Pack years: 2.50    Types: Cigarettes    Last attempt to quit: 08/19/2014    Years since quitting: 4.0  . Smokeless tobacco: Never Used  Substance Use Topics  . Alcohol use: No    Alcohol/week: 0.0 standard drinks  . Drug use: No     Allergies   Penicillins; Diltiazem; Tamiflu [oseltamivir phosphate]; and Tinidazole   Review of Systems Review of Systems  All other systems  reviewed and are negative.    Physical Exam Updated Vital Signs BP 140/72   Pulse 66   Temp 98.8 F (37.1 C) (Oral)   Resp 18   Ht 5' 4"  (1.626 m)   Wt 75.3 kg   SpO2 100%   BMI 28.49 kg/m   Physical Exam  Constitutional: She is oriented to person, place, and time. She appears well-developed and well-nourished.  Non-toxic appearance. No distress.  HENT:  Head: Normocephalic and atraumatic.  Nose: Nose normal.  Mouth/Throat: Oropharynx is clear and moist.  Eyes: Pupils are equal, round, and reactive to light. Conjunctivae are normal. Right eye exhibits no discharge. Left eye exhibits no discharge.  Neck: Normal range of motion. Neck supple. No JVD present. No tracheal deviation present.  Cardiovascular: Normal rate, regular rhythm, normal heart sounds and intact distal pulses.  Pulmonary/Chest: Effort normal and breath sounds normal. No respiratory distress. She exhibits no tenderness.  No hypoxia or tachypnea.  Abdominal: Soft. Bowel sounds are normal. She exhibits no distension. There is no tenderness. There is no rebound and no guarding.  Musculoskeletal:       Left knee: She exhibits decreased range of motion. She exhibits no swelling, no effusion, no ecchymosis, no deformity, no laceration, no erythema, normal alignment, no LCL laxity and normal patellar mobility. Tenderness found. Medial joint line, lateral joint line and patellar tendon tenderness noted.       Right lower leg: Normal. She exhibits no edema.       Left lower leg: Normal. She exhibits no  edema.  No lower extremity edema or calf tenderness.  Lymphadenopathy:    She has no cervical adenopathy.  Neurological: She is alert and oriented to person, place, and time.  Skin: Skin is warm and dry. Capillary refill takes less than 2 seconds. She is not diaphoretic.  Psychiatric: Her behavior is normal. Judgment and thought content normal.  Nursing note and vitals reviewed.    ED Treatments / Results  Labs (all labs ordered are listed, but only abnormal results are displayed) Labs Reviewed  BASIC METABOLIC PANEL - Abnormal; Notable for the following components:      Result Value   Glucose, Bld 100 (*)    Creatinine, Ser 1.19 (*)    GFR calc non Af Amer 55 (*)    All other components within normal limits  D-DIMER, QUANTITATIVE (NOT AT [facility]) - Abnormal; Notable for the following components:   D-Dimer, Quant 2.71 (*)    All other components within normal limits  CBC  D-DIMER, QUANTITATIVE (NOT AT [facility])  I-STAT TROPONIN, ED  I-STAT BETA HCG BLOOD, ED ([facility], [facility], [facility] ONLY)  I-STAT TROPONIN, ED    EKG EKG Interpretation  Date/Time:  Tuesday September 11 2018 20:23:11 EST Ventricular Rate:  86 PR Interval:  156 QRS Duration: 86 QT Interval:  356 QTC Calculation: 426 R Axis:   59 Text Interpretation:  Normal sinus rhythm Normal ECG No significant change since last tracing Confirmed by Addison Lank [phone]) on 09/12/2018 1:00:40 AM   Radiology Ct Angio Chest Pe W And/or Wo Contrast  Result Date: 09/11/2018 CLINICAL DATA:  Left leg pain, chest tightness, and shortness of breath. Multiple car trips to [place]. EXAM: CT ANGIOGRAPHY CHEST WITH CONTRAST TECHNIQUE: Multidetector CT imaging of the chest was performed using the standard protocol during bolus administration of intravenous contrast. Multiplanar CT image reconstructions and MIPs were obtained to evaluate the vascular anatomy. CONTRAST:  12m ISOVUE-370 IOPAMIDOL (ISOVUE-370) INJECTION 76% COMPARISON:  12/10/2014  FINDINGS: Cardiovascular: Good opacification of the central and segmental pulmonary arteries. No focal filling defects. No evidence of significant pulmonary embolus. Normal heart size. No pericardial effusions. Normal caliber thoracic aorta. No aortic dissection. Great vessel origins are patent. Mediastinum/Nodes: Esophagus is decompressed. No significant lymphadenopathy in the chest. Lungs/Pleura: Lungs are clear and expanded. No pleural effusions. No pneumothorax. Airways are patent. Upper Abdomen: No acute abnormalities are demonstrated. Musculoskeletal: No chest wall abnormality. No acute or significant osseous findings. Review of the MIP images confirms the above findings. IMPRESSION: 1. No evidence of significant pulmonary embolus. 2. No evidence of active pulmonary disease. Electronically Signed   By: Lucienne Capers M.D.   On: 09/11/2018 22:42    Procedures Procedures (including critical care time)  Medications Ordered in ED Medications  enoxaparin (LOVENOX) injection 75 mg (has no administration in time range)  lidocaine (LIDODERM) 5 % 1 patch (1 patch Transdermal Patch Applied 09/12/18 0127)  iopamidol (ISOVUE-370) 76 % injection 100 mL (100 mLs Intravenous Contrast Given 09/11/18 2220)     Initial Impression / Assessment and Plan / ED Course  I have reviewed the triage vital signs and the nursing notes.  Pertinent labs & imaging results that were available during my care of the patient were reviewed by me and considered in my medical decision making (see chart for details).    Patient presents the ED for evaluation of left leg pain and swelling with associated chest pain or shortness of breath.  Patient is on oral birth control that has several trips to [place].  She has remote history of smoking without any cardiac history.  Patient was sent by urgent care for DVT study.  However unable to obtain ultrasound at this time given that we do not have it available.  Work-up was initiated in  triage.  Patient had a normal troponin.  Her electrolytes are reassuring with no leukocytosis or normal hemoglobin.  Exam is reassuring patient neurovascularly intact.  Heart regular rate and rhythm.  Lungs clear to auscultation bilaterally.  No obvious swelling to the left lower leg with patient does have pain to palpation over the patella and proximal fossa the left leg.  There is no erythema or warmth concerning for septic arthritis.  She had x-rays performed in urgent care that are reassuring per patient.  Patient had a negative delta troponin in the ED.  EKG shows normal sinus rhythm and appears similar to prior tracings were reviewed by myself.  Heart pathway score is 2.  Low suspicion for ACS at this time as patient has had having active chest pain.  The patient did have CT PE study performed in triage that was normal.  Patient was requesting a d-dimer results as she did not want to receive the Lovenox if normal and will proceed with outpatient ultrasound in the a.m.  D-dimer returned was elevated at 2.71.  Had a long discussion with patient.  Patient agrees to Lovenox injection and will follow-up for outpatient ultrasound in the a.m.  Patient treated with pain medication lidocaine for her left knee pain.  I suspect musculoskeletal pain and possible Baker's cyst.  Patient will need orthopedic follow-up.  Pt is hemodynamically stable, in NAD, & able to ambulate in the ED. Evaluation does not show pathology that would require ongoing emergent intervention or inpatient treatment. I explained the diagnosis to the patient. Pain has been managed & has no complaints prior to dc. Pt is comfortable with above plan and is stable for  discharge at this time. All questions were answered prior to disposition. Strict return precautions for f/u to the ED were discussed. Encouraged follow up with PCP.   Pt was seen by my attending Dr. Leonette Monarch who is agreeable with the above plan.  Final Clinical Impressions(s) / ED  Diagnoses   Final diagnoses:  Shortness of breath  Chest pain, unspecified type  Acute pain of left knee    ED Discharge Orders         Ordered    UE VENOUS DUPLEX     09/12/18 0114           Doristine Devoid, PA-C 09/12/18 1148    Cardama, Grayce Sessions, MD 09/13/18 [phone]

## 2018-09-12 NOTE — ED Notes (Signed)
ED Provider at bedside. 

## 2018-09-12 NOTE — ED Notes (Signed)
Reviewed d/c instructions with pt, who verbalized understanding and had no outstanding questions. Pt departed in NAD.   

## 2018-09-12 NOTE — Progress Notes (Signed)
*  Preliminary Results* Left lower extremity venous duplex completed. Left lower extremity is negative for deep vein thrombosis. There is no evidence of left Baker's cyst.  09/12/2018 8:53 AM  Maudry Mayhew, MHA, RVT, RDCS, RDMS

## 2018-09-27 DIAGNOSIS — M5136 Other intervertebral disc degeneration, lumbar region: Secondary | ICD-10-CM | POA: Diagnosis not present

## 2018-09-28 ENCOUNTER — Telehealth: Payer: Self-pay | Admitting: Family Medicine

## 2018-09-28 NOTE — Telephone Encounter (Signed)
Was told by Corene Cornea to call pt and let her know that we are no longer open til 4 on Saturday's and pt had a 3:40 appt on 11/23 called pt and left a vm to let her know that she could have an earlier appt on 11/23. Told pt to call back and sch.

## 2018-09-29 ENCOUNTER — Ambulatory Visit: Payer: 59 | Admitting: Osteopathic Medicine

## 2018-10-01 ENCOUNTER — Ambulatory Visit: Payer: 59 | Admitting: Physician Assistant

## 2018-10-01 ENCOUNTER — Other Ambulatory Visit: Payer: Self-pay

## 2018-10-01 ENCOUNTER — Encounter: Payer: Self-pay | Admitting: Physician Assistant

## 2018-10-01 VITALS — BP 152/90 | HR 96 | Temp 100.0°F | Resp 20 | Ht 62.99 in | Wt 166.2 lb

## 2018-10-01 DIAGNOSIS — R82998 Other abnormal findings in urine: Secondary | ICD-10-CM

## 2018-10-01 DIAGNOSIS — R002 Palpitations: Secondary | ICD-10-CM

## 2018-10-01 DIAGNOSIS — F418 Other specified anxiety disorders: Secondary | ICD-10-CM | POA: Diagnosis not present

## 2018-10-01 DIAGNOSIS — L853 Xerosis cutis: Secondary | ICD-10-CM

## 2018-10-01 DIAGNOSIS — R030 Elevated blood-pressure reading, without diagnosis of hypertension: Secondary | ICD-10-CM

## 2018-10-01 DIAGNOSIS — R5383 Other fatigue: Secondary | ICD-10-CM | POA: Diagnosis not present

## 2018-10-01 LAB — POCT CBC
Granulocyte percent: 34.4 %G — AB (ref 37–80)
HCT, POC: 41.1 % — AB (ref 29–41)
Hemoglobin: 14.1 g/dL — AB (ref 9.5–13.5)
Lymph, poc: 4.6 — AB (ref 0.6–3.4)
MCH, POC: 31 pg (ref 27–31.2)
MCHC: 34.2 g/dL (ref 31.8–35.4)
MCV: 90.5 fL (ref 76–111)
MID (cbc): 0.3 (ref 0–0.9)
MPV: 6.9 fL (ref 0–99.8)
POC Granulocyte: 2.6 (ref 2–6.9)
POC LYMPH PERCENT: 61.9 %L — AB (ref 10–50)
POC MID %: 3.7 %M (ref 0–12)
Platelet Count, POC: 282 10*3/uL (ref 142–424)
RBC: 4.54 M/uL (ref 4.04–5.48)
RDW, POC: 12.4 %
WBC: 7.5 10*3/uL (ref 4.6–10.2)

## 2018-10-01 LAB — POCT URINALYSIS DIP (MANUAL ENTRY)
Bilirubin, UA: NEGATIVE
Glucose, UA: NEGATIVE mg/dL
Ketones, POC UA: NEGATIVE mg/dL
Nitrite, UA: NEGATIVE
Protein Ur, POC: NEGATIVE mg/dL
Spec Grav, UA: 1.02 (ref 1.010–1.025)
Urobilinogen, UA: 0.2 E.U./dL
pH, UA: 6 (ref 5.0–8.0)

## 2018-10-01 LAB — GLUCOSE, POCT (MANUAL RESULT ENTRY): POC Glucose: 83 mg/dl (ref 70–99)

## 2018-10-01 MED ORDER — ALPRAZOLAM 0.25 MG PO TBDP: 0.2500 mg | ORAL_TABLET | Freq: Every evening | ORAL | 1 refills | Status: DC | PRN

## 2018-10-01 NOTE — Patient Instructions (Addendum)
Your labs and EKG look great.  I think the next best step would be to do the holter monitor. Call the [facility] office about your holter monitor: Phone: [phone]. If no answer from them, send Dr. Brigitte Pulse a message to ask.   Follow-up with your cardiologist.   Follow-up with Dr. Brigitte Pulse in 2 months. Please be sure to discuss possibly starting a medication like citalopram.    If you have lab work done today you will be contacted with your lab results within the next 2 weeks.  If you have not heard from [place] then please contact us. The fastest way to get your results is to register for My Chart.  Thank you for coming in today. I hope you feel we met your needs.  Feel free to call PCP if you have any questions or further requests.  Please consider signing up for MyChart if you do not already have it, as this is a great way to communicate with me.  Best,  Whitney McVey, PA-C  IF you received an x-ray today, you will receive an invoice from [facility]. Please contact [facility] at [phone] with questions or concerns regarding your invoice.   IF you received labwork today, you will receive an invoice from [facility]. Please contact [facility] at [phone] with questions or concerns regarding your invoice.   Our billing staff will not be able to assist you with questions regarding bills from these companies.  You will be contacted with the lab results as soon as they are available. The fastest way to get your results is to activate your My Chart account. Instructions are located on the last page of this paperwork. If you have not heard from [place] regarding the results in 2 weeks, please contact this office.

## 2018-10-01 NOTE — Progress Notes (Signed)
Brooke Turner  MRN: [id number] DOB: 1976-09-19  PCP: [name], MD  Subjective:  Pt is a 42 year old female who presents to clinic for several symptoms.    Skin seems more yellow recently  Skin is really dry and itchy.  Dizzy a few weeks ago.  She is fine now.  Upper back pain x several days last week.  Fatigue x several days. "I feel drained with no energy". She goes to the gym 4-5 days/week and was really tired at the end of her workouts   She presented to the ED 20 days ago for knee pain and palpitations. Cleared from cardiac symptoms. She has a chest CT and EKG. Tests were negative. stress echo in 2018, also negative. Last OV with cards > 1 year ago.   She has appt with Dr. Brigitte Pulse in Jan  Anxiety - Dr. Brigitte Pulse prescribed alprazolam a few months ago, but pt never took them. She is too nervous about taking medications.   Review of Systems  Constitutional: Positive for fatigue. Negative for chills, diaphoresis and fever.  Respiratory: Negative for cough, chest tightness, shortness of breath and wheezing.   Endocrine: Negative for cold intolerance and heat intolerance.  Musculoskeletal: Positive for back pain.  Neurological: Positive for dizziness. Negative for light-headedness and headaches.    Patient Active Problem List   Diagnosis Date Noted  . Left facial pain 09/20/2016  . Head trauma 09/20/2016  . Palpitations   . Chest pain   . Left sided numbness 06/11/2015  . Chronic tension-type headache, not intractable 06/10/2015  . Dyspnea 03/18/2015  . Post-traumatic stress reaction 02/25/2015  . Anxiety disorder due to general medical condition with panic attack 02/25/2015  . Subserous leiomyoma of uterus 02/25/2015  . Anxiety about health 02/25/2015  . Deviated nasal septum 12/30/2014  . Chronic rhinitis 12/30/2014  . Chronic maxillary sinusitis 12/30/2014  . Snorings 09/30/2014  . Fatigue 09/26/2014  . Dizziness and giddiness 09/11/2014  . Tachycardia  09/11/2014  . Precordial chest pain 09/11/2014  . Prediabetes 08/26/2014  . Acanthosis nigricans 03/19/2014  . Vitamin D deficiency 07/15/2013  . Large breasts 09/12/2012  . Essential hypertension 02/17/2012  . Contraception management 02/17/2012  . Obesity (BMI 30-39.9) previous BMI was over 40. Patient has lost 70 pounds in the last year 02/17/2012  . Anxiety disorder 02/17/2012  . Chronic back pain 02/17/2012    Current Outpatient Medications on File Prior to Visit  Medication Sig Dispense Refill  . blood glucose meter kit and supplies Test blood sugar twice daily. Dx code: R73.09 1 each 0  . carvedilol (COREG) 3.125 MG tablet Take 1 tablet (3.125 mg total) by mouth 2 (two) times daily with a meal. 180 tablet 1  . famotidine (PEPCID) 20 MG tablet Take 20 mg by mouth 2 (two) times daily.    . fluticasone (FLONASE) 50 MCG/ACT nasal spray Place 2 sprays into both nostrils daily. 16 g 0  . LORazepam (ATIVAN) 0.5 MG tablet Take 1 tablet (0.5 mg total) by mouth every 6 (six) hours as needed for anxiety. 30 tablet 2  . Norethindrone-Ethinyl Estradiol-Fe Biphas (LO LOESTRIN FE) 1 MG-10 MCG / 10 MCG tablet Take 1 tablet by mouth daily.    [name] DELICA LANCETS 29J MISC USE TO TEST BLOOD SUGAR DAILY 100 each 0  . ONETOUCH VERIO test strip TEST BLOOD SUGAR DAILY 25 each 10  . Prenatal Vit-Fe Fumarate-FA (PRENATAL MULTIVITAMIN) TABS tablet Take 1 tablet by mouth daily.     Marland Kitchen  ranitidine (ZANTAC) 150 MG tablet Take 1 tablet (150 mg total) by mouth 2 (two) times daily. (Patient not taking: Reported on 10/01/2018) 180 tablet 3  . triamcinolone cream (KENALOG) 0.1 % Apply 1 application topically 2 (two) times daily. (Patient not taking: Reported on 09/12/2018) 30 g 0   No current facility-administered medications on file prior to visit.     Allergies  Allergen Reactions  . Penicillins Hives    Has patient had a PCN reaction causing immediate rash, facial/tongue/throat swelling, SOB or  lightheadedness with hypotension:Yes Has patient had a PCN reaction causing severe rash involving mucus membranes or skin necrosis:unsure Has patient had a PCN reaction that required hospitalization: Yes Has patient had a PCN reaction occurring within the last 10 years: No If all of the above answers are "NO", then may proceed with Cephalosporin use.     . Diltiazem     Dizzy, felt like she was going to pass out Blood pressure went up per patient   . Tamiflu [Oseltamivir Phosphate] Nausea And Vomiting    Excessive vomiting  . Tinidazole     Caused severe abd pain/type of colitis      Objective:  BP (!) 175/91   Pulse 96   Temp 100 F (37.8 C) (Oral)   Resp 20   Ht 5' 2.99" (1.6 m)   Wt 166 lb 3.2 oz (75.4 kg)   LMP 03/18/2018   SpO2 100%   BMI 29.45 kg/m   Physical Exam  Constitutional: She is oriented to person, place, and time. She appears well-developed and well-nourished. No distress.  Cardiovascular: Normal rate, regular rhythm and normal heart sounds.  Neurological: She is alert and oriented to person, place, and time.  Skin: Skin is warm and dry.  Psychiatric: Judgment normal. Her mood appears anxious.  Anxious about health  Vitals reviewed.   Results for orders placed or performed in visit on 10/01/18  POCT glucose (manual entry)  Result Value Ref Range   POC Glucose 83 70 - 99 mg/dl  POCT CBC  Result Value Ref Range   WBC 7.5 4.6 - 10.2 K/uL   Lymph, poc 4.6 (A) 0.6 - 3.4   POC LYMPH PERCENT 61.9 (A) 10 - 50 %L   MID (cbc) 0.3 0 - 0.9   POC MID % 3.7 0 - 12 %M   POC Granulocyte 2.6 2 - 6.9   Granulocyte percent 34.4 (A) 37 - 80 %G   RBC 4.54 4.04 - 5.48 M/uL   Hemoglobin 14.1 (A) 9.5 - 13.5 g/dL   HCT, POC 41.1 (A) 29 - 41 %   MCV 90.5 76 - 111 fL   MCH, POC 31.0 27 - 31.2 pg   MCHC 34.2 31.8 - 35.4 g/dL   RDW, POC 12.4 %   Platelet Count, POC 282 142 - 424 K/uL   MPV 6.9 0 - 99.8 fL  POCT urinalysis dipstick  Result Value Ref Range   Color,  UA yellow yellow   Clarity, UA clear clear   Glucose, UA negative negative mg/dL   Bilirubin, UA negative negative   Ketones, POC UA negative negative mg/dL   Spec Grav, UA 1.020 1.010 - 1.025   Blood, UA trace-lysed (A) negative   pH, UA 6.0 5.0 - 8.0   Protein Ur, POC negative negative mg/dL   Urobilinogen, UA 0.2 0.2 or 1.0 E.U./dL   Nitrite, UA Negative Negative   Leukocytes, UA Trace (A) Negative    Assessment and Plan :  1. Anxiety about health - Pt presents with interesting constellation of symptoms. She has had cards work-up in the last year with Echo, CT chest and EKGs all of which were negative. Recheck blood pressure was 152/90. EKG unchanged from tracing 3 weeks ago. Advised pt to call [facility] cards office to ask about holter monitor Dr. Brigitte Pulse ordered a few months ago. She is to f/u with her cardiologist. I feel possible uncontrolled anxiety is contributing to her symptoms. She has f/u appt with Dr. Brigitte Pulse in Jan - advised pt to discuss possibly starting SSRI. TSH and CMP are pending, will contact with results.  - ALPRAZolam (NIRAVAM) 0.25 MG dissolvable tablet; Take 1 tablet (0.25 mg total) by mouth at bedtime as needed for anxiety.  Dispense: 20 tablet; Refill: 1  2. Elevated blood pressure reading 3. Palpitations - Recheck vitals - EKG 12-Lead  4. Other fatigue - POCT glucose (manual entry) - POCT CBC - POCT urinalysis dipstick - CMP14+EGFR  5. Dry skin - TSH  6. Leukocytes in urine - Urine Culture   Mercer Pod, PA-C  [facility] 10/01/2018 5:17 PM  Please note: Portions of this report may have been transcribed using dragon voice recognition software. Every effort was made to ensure accuracy; however, inadvertent computerized transcription errors may be present.

## 2018-10-02 LAB — CMP14+EGFR
ALT: 19 IU/L (ref 0–32)
AST: 26 IU/L (ref 0–40)
Albumin/Globulin Ratio: 1.6 (ref 1.2–2.2)
Albumin: 4.5 g/dL (ref 3.5–5.5)
Alkaline Phosphatase: 51 IU/L (ref 39–117)
BUN/Creatinine Ratio: 10 (ref 9–23)
BUN: 12 mg/dL (ref 6–24)
Bilirubin Total: 1 mg/dL (ref 0.0–1.2)
CO2: 21 mmol/L (ref 20–29)
Calcium: 9.1 mg/dL (ref 8.7–10.2)
Chloride: 103 mmol/L (ref 96–106)
Creatinine, Ser: 1.15 mg/dL — ABNORMAL HIGH (ref 0.57–1.00)
GFR calc Af Amer: 67 mL/min/{1.73_m2} (ref >59)
GFR calc non Af Amer: 58 mL/min/{1.73_m2} — ABNORMAL LOW (ref >59)
Globulin, Total: 2.9 g/dL (ref 1.5–4.5)
Glucose: 92 mg/dL (ref 65–99)
Potassium: 4.1 mmol/L (ref 3.5–5.2)
Sodium: 138 mmol/L (ref 134–144)
Total Protein: 7.4 g/dL (ref 6.0–8.5)

## 2018-10-02 LAB — URINE CULTURE

## 2018-10-02 LAB — TSH: TSH: 0.846 u[IU]/mL (ref 0.450–4.500)

## 2018-10-08 ENCOUNTER — Emergency Department (HOSPITAL_COMMUNITY)
Admission: EM | Admit: 2018-10-08 | Discharge: 2018-10-08 | Disposition: A | Discharge status: Home / Self Care | Payer: 59 | Attending: Emergency Medicine | Admitting: Emergency Medicine

## 2018-10-08 ENCOUNTER — Other Ambulatory Visit: Payer: Self-pay

## 2018-10-08 ENCOUNTER — Emergency Department (HOSPITAL_COMMUNITY): Payer: 59

## 2018-10-08 ENCOUNTER — Encounter (HOSPITAL_COMMUNITY): Payer: Self-pay

## 2018-10-08 DIAGNOSIS — R232 Flushing: Secondary | ICD-10-CM | POA: Insufficient documentation

## 2018-10-08 DIAGNOSIS — Z79899 Other long term (current) drug therapy: Secondary | ICD-10-CM | POA: Insufficient documentation

## 2018-10-08 DIAGNOSIS — R079 Chest pain, unspecified: Secondary | ICD-10-CM | POA: Diagnosis not present

## 2018-10-08 DIAGNOSIS — R110 Nausea: Secondary | ICD-10-CM | POA: Diagnosis not present

## 2018-10-08 DIAGNOSIS — R0602 Shortness of breath: Secondary | ICD-10-CM | POA: Diagnosis not present

## 2018-10-08 DIAGNOSIS — R0789 Other chest pain: Secondary | ICD-10-CM

## 2018-10-08 DIAGNOSIS — R42 Dizziness and giddiness: Secondary | ICD-10-CM | POA: Diagnosis not present

## 2018-10-08 DIAGNOSIS — E119 Type 2 diabetes mellitus without complications: Secondary | ICD-10-CM | POA: Diagnosis not present

## 2018-10-08 DIAGNOSIS — R600 Localized edema: Secondary | ICD-10-CM | POA: Diagnosis not present

## 2018-10-08 DIAGNOSIS — R002 Palpitations: Secondary | ICD-10-CM | POA: Diagnosis not present

## 2018-10-08 DIAGNOSIS — I10 Essential (primary) hypertension: Secondary | ICD-10-CM | POA: Insufficient documentation

## 2018-10-08 DIAGNOSIS — Z87891 Personal history of nicotine dependence: Secondary | ICD-10-CM | POA: Diagnosis not present

## 2018-10-08 DIAGNOSIS — R55 Syncope and collapse: Secondary | ICD-10-CM | POA: Diagnosis not present

## 2018-10-08 LAB — BRAIN NATRIURETIC PEPTIDE: B Natriuretic Peptide: 36 pg/mL (ref 0.0–100.0)

## 2018-10-08 LAB — CBC
HCT: 40.7 % (ref 36.0–46.0)
Hemoglobin: 13.5 g/dL (ref 12.0–15.0)
MCH: 29.9 pg (ref 26.0–34.0)
MCHC: 33.2 g/dL (ref 30.0–36.0)
MCV: 90 fL (ref 80.0–100.0)
Platelets: 249 10*3/uL (ref 150–400)
RBC: 4.52 MIL/uL (ref 3.87–5.11)
RDW: 11.9 % (ref 11.5–15.5)
WBC: 5.7 10*3/uL (ref 4.0–10.5)
nRBC: 0 % (ref 0.0–0.2)

## 2018-10-08 LAB — BASIC METABOLIC PANEL
Anion gap: 5 (ref 5–15)
BUN: 14 mg/dL (ref 6–20)
CO2: 26 mmol/L (ref 22–32)
Calcium: 8.6 mg/dL — ABNORMAL LOW (ref 8.9–10.3)
Chloride: 106 mmol/L (ref 98–111)
Creatinine, Ser: 1 mg/dL (ref 0.44–1.00)
GFR calc Af Amer: >60 mL/min (ref >60)
GFR calc non Af Amer: >60 mL/min (ref >60)
Glucose, Bld: 115 mg/dL — ABNORMAL HIGH (ref 70–99)
Potassium: 3.8 mmol/L (ref 3.5–5.1)
Sodium: 137 mmol/L (ref 135–145)

## 2018-10-08 LAB — I-STAT BETA HCG BLOOD, ED (MC, WL, AP ONLY): I-stat hCG, quantitative: <5 m[IU]/mL (ref <5)

## 2018-10-08 LAB — I-STAT TROPONIN, ED: Troponin i, poc: 0 ng/mL (ref 0.00–0.08)

## 2018-10-08 LAB — D-DIMER, QUANTITATIVE: D-Dimer, Quant: 0.49 ug/mL-FEU (ref 0.00–0.50)

## 2018-10-08 NOTE — ED Triage Notes (Signed)
Pt brought in by EMS due to having chest pain while at work. Per pt, she got a flush feeling and felt like she was going to pass out. Pt c/o burning chest pain and jaw discomfort during episode. Pt denies LOC. Pt received 324mg  of aspirin and refused nitro. Pt a&ox4.

## 2018-10-08 NOTE — Discharge Instructions (Addendum)
You will need to call your cardiologist to set up on appointment for follow up. Given that you are having palpitations you may benefit from wearing a Holter Monitor.  You should also call your primary care doctor to set up an appointment to have your blood pressure rechecked as it was somewhat elevated in the emergency department today.  Please return to the emergency department for any new or worsening symptoms.

## 2018-10-08 NOTE — ED Notes (Signed)
During orthostatic vitals, pt felt "lightheaded" while standing up. Pt also stated that her "face felt flush".

## 2018-10-08 NOTE — ED Notes (Signed)
Lab notified of Add-on for BNP and D-Dimer.

## 2018-10-08 NOTE — ED Provider Notes (Signed)
[facility] EMERGENCY DEPARTMENT Provider Note   CSN: [id number] Arrival date & time: 10/08/18  1445     History   Chief Complaint Chief Complaint  Patient presents with  . Chest Pain    HPI Brooke Turner is a 42 y.o. female.  HPI   Pt is a 42 y/o female with a h/o HTN, who presents to the ED via EMS today c/o chest pain that began when while she was at work at about 10am. Describes pain as burning to the left side of the chest that is intermittent. Pain radiates down her chest and into her jaw. Pain rated at 7/10. No pain with inspiration. She then had a near syncopal episode. States that her hearing decreased, she became "spacey" and lightheaded, SOB, hot and flushed. She also felt palpitations and nausea. Denies that she actually lost consciousness or fell down. States she has felt more SOB over the last several days at rest and with exertion. Denies that her CP today was associated with exertion. Following the episode she took her BP medications and a lorazepam.   States she has had left knee swelling after an injury several weeks ago, but no calf swelling/pain. She denies hemoptysis, recent surgery/trauma, recent long travel, personal hx of cancer, or hx of DVT/PE. Pt is on OCPs.  Pt received received 324 ASA en route. She declined ntg en route.   She denies a h/o DM or HLD. She has h/o tobacco use (x10 years, socially), quit 4 years ago.   Of note, pt was seen in the ED on 09/11/18 and was worked up for a PE/DVT. LE ultrasound and CTA were negative for clot.    Reviewed EMS rhythm strip which showed a heart rate of 120 with sinus tachycardia and ST junctional depression that is nonspecific.  Past Medical History:  Diagnosis Date  . Anxiety   . Back pain   . Chest pain    a. 09/2014 Echo: EF 60-65%, no rwma, nl valves;  b. 12/2014 Neg ETT.  . Diabetes mellitus without complication ([place])   . Essential hypertension    a. 01/2016 Renal duplex: no RAS-  ? renal cyst (renal u/s 4/17 - no cyst, nl study); b. 02/2016 24 hr BP Monitor: Mean SBP ~ 138 with Max of 198. Highest pressures noted in evening hours following placement of cuff.  . Fibroid uterus   . GERD (gastroesophageal reflux disease)   . Palpitations    a. 09/2014 nl event monitor.    Patient Active Problem List   Diagnosis Date Noted  . Left facial pain 09/20/2016  . Head trauma 09/20/2016  . Palpitations   . Chest pain   . Left sided numbness 06/11/2015  . Chronic tension-type headache, not intractable 06/10/2015  . Dyspnea 03/18/2015  . Post-traumatic stress reaction 02/25/2015  . Anxiety disorder due to general medical condition with panic attack 02/25/2015  . Subserous leiomyoma of uterus 02/25/2015  . Anxiety about health 02/25/2015  . Deviated nasal septum 12/30/2014  . Chronic rhinitis 12/30/2014  . Chronic maxillary sinusitis 12/30/2014  . Snorings 09/30/2014  . Fatigue 09/26/2014  . Dizziness and giddiness 09/11/2014  . Tachycardia 09/11/2014  . Precordial chest pain 09/11/2014  . Prediabetes 08/26/2014  . Acanthosis nigricans 03/19/2014  . Vitamin D deficiency 07/15/2013  . Large breasts 09/12/2012  . Essential hypertension 02/17/2012  . Contraception management 02/17/2012  . Obesity (BMI 30-39.9) previous BMI was over 40. Patient has lost 70 pounds in the  last year 02/17/2012  . Anxiety disorder 02/17/2012  . Chronic back pain 02/17/2012    Past Surgical History:  Procedure Laterality Date  . WISDOM TOOTH EXTRACTION       OB History    Gravida  2   Para  0   Term  0   Preterm  0   AB  2   Living  0     SAB  1   TAB  1   Ectopic  0   Multiple  0   Live Births               Home Medications    Prior to Admission medications   Medication Sig Start Date End Date Taking? Authorizing Provider  ALPRAZolam (NIRAVAM) 0.25 MG dissolvable tablet Take 1 tablet (0.25 mg total) by mouth at bedtime as needed for anxiety. 10/01/18  Yes  McVey, Gelene Mink, PA-C  carvedilol (COREG) 3.125 MG tablet Take 1 tablet (3.125 mg total) by mouth 2 (two) times daily with a meal. 07/02/18  Yes [name], MD  famotidine (PEPCID) 20 MG tablet Take 20 mg by mouth 2 (two) times daily.   Yes [provider]  fluticasone (FLONASE) 50 MCG/ACT nasal spray Place 2 sprays into both nostrils daily. Patient taking differently: Place 2 sprays into both nostrils daily as needed for allergies.  10/10/17  Yes Timmothy Euler, [place] D, PA-C  LORazepam (ATIVAN) 0.5 MG tablet Take 1 tablet (0.5 mg total) by mouth every 6 (six) hours as needed for anxiety. Patient taking differently: Take 0.25-0.5 mg by mouth every 6 (six) hours as needed for anxiety.  01/18/18  Yes [name], MD  Multiple Vitamins-Minerals (MULTIVITAMIN GUMMIES ADULT PO) Take 2 tablets by mouth.   Yes [provider]  Norethindrone-Ethinyl Estradiol-Fe Biphas (LO LOESTRIN FE) 1 MG-10 MCG / 10 MCG tablet Take 1 tablet by mouth at bedtime.    Yes [provider]  blood glucose meter kit and supplies Test blood sugar twice daily. Dx code: R73.09 07/22/15   [name], MD  [facility] DELICA LANCETS 56E MISC USE TO TEST BLOOD SUGAR DAILY 10/17/16   [name], MD  [facility] VERIO test strip TEST BLOOD SUGAR DAILY 07/19/16   [name], MD  ranitidine (ZANTAC) 150 MG tablet Take 1 tablet (150 mg total) by mouth 2 (two) times daily. Patient not taking: Reported on 10/01/2018 07/02/18   [name], MD  triamcinolone cream (KENALOG) 0.1 % Apply 1 application topically 2 (two) times daily. Patient not taking: Reported on 09/12/2018 08/06/18   Posey Boyer, MD    Family History Family History  Problem Relation Age of Onset  . Seizures Mother   . Cancer Father        Liver  . Diabetes Father 72  . Hypertension Father   . Heart disease Father 97       CEA, LE [place]  . Alzheimer's disease Maternal Grandmother   . Heart disease Maternal Grandfather     Social  History Social History   Tobacco Use  . Smoking status: Former Smoker    Packs/day: 0.25    Years: 10.00    Pack years: 2.50    Types: Cigarettes    Last attempt to quit: 08/19/2014    Years since quitting: 4.1  . Smokeless tobacco: Never Used  Substance Use Topics  . Alcohol use: No    Alcohol/week: 0.0 standard drinks  . Drug use: No  Allergies   Penicillins; Diltiazem; Tamiflu [oseltamivir phosphate]; and Tinidazole   Review of Systems Review of Systems  Constitutional: Positive for diaphoresis. Negative for chills and fever.  HENT: Negative for congestion, rhinorrhea and sore throat.   Eyes: Negative for visual disturbance.  Respiratory: Positive for shortness of breath. Negative for cough.   Cardiovascular: Positive for chest pain and palpitations. Negative for leg swelling.  Gastrointestinal: Negative for abdominal pain, constipation, diarrhea, nausea and vomiting.  Genitourinary: Negative for dysuria and flank pain.  Musculoskeletal: Negative for back pain.  Neurological: Positive for light-headedness (resolved). Negative for dizziness, syncope, weakness, numbness and headaches.       Near syncope     Physical Exam Updated Vital Signs BP (!) 173/99 (BP Location: Right Arm)   Pulse 88   Temp 98.1 F (36.7 C) (Oral)   Resp 17   Ht _0  (1.575 m)   Wt 75.3 kg   SpO2 100%   BMI 30.36 kg/m   Physical Exam  Constitutional: She appears well-developed and well-nourished. She does not appear ill. No distress.  HENT:  Head: Normocephalic and atraumatic.  Eyes: Conjunctivae are normal.  Neck: Neck supple.  Cardiovascular: Normal rate, regular rhythm and normal pulses.  No murmur heard. Pulses:      Radial pulses are 2+ on the right side, and 2+ on the left side.       Dorsalis pedis pulses are 2+ on the right side, and 2+ on the left side.  Pulmonary/Chest: Effort normal and breath sounds normal. No respiratory distress. She has no decreased breath  sounds. She has no wheezes. She has no rhonchi. She has no rales.  Left chest wall TTP that does not reproduce pain, no rashes  Abdominal: Soft. Bowel sounds are normal. She exhibits no distension. There is no tenderness. There is no guarding.  Musculoskeletal: She exhibits no edema.       Right lower leg: Normal. She exhibits no tenderness and no edema.       Left lower leg: Normal. She exhibits no edema.  Neurological: She is alert.  Skin: Skin is warm and dry. Capillary refill takes less than 2 seconds.  Psychiatric: She has a normal mood and affect.  Nursing note and vitals reviewed.  ED Treatments / Results  Labs (all labs ordered are listed, but only abnormal results are displayed) Labs Reviewed  BASIC METABOLIC PANEL - Abnormal; Notable for the following components:      Result Value   Glucose, Bld 115 (*)    Calcium 8.6 (*)    All other components within normal limits  CBC  BRAIN NATRIURETIC PEPTIDE  D-DIMER, QUANTITATIVE (NOT AT [facility])  I-STAT TROPONIN, ED  I-STAT BETA HCG BLOOD, ED ([facility], [facility], [facility] ONLY)    EKG EKG Interpretation  Date/Time:  Monday October 08 2018 14:50:14 EST Ventricular Rate:  85 PR Interval:    QRS Duration: 96 QT Interval:  355 QTC Calculation: 423 R Axis:   44 Text Interpretation:  Sinus rhythm Consider right atrial enlargement No significant change since last tracing Confirmed by Deno Etienne [phone]) on 10/08/2018 5:29:45 PM   Radiology Dg Chest 2 View  Result Date: 10/08/2018 CLINICAL DATA:  Chest pain.  Near syncope. EXAM: CHEST - 2 VIEW COMPARISON:  09/11/2018 FINDINGS: The heart size and mediastinal contours are within normal limits. Both lungs are clear. The visualized skeletal structures are unremarkable. IMPRESSION: No active cardiopulmonary disease. Electronically Signed   By: Kerby Moors M.D.   On:  10/08/2018 16:44    Procedures Procedures (including critical care time)  Medications Ordered in ED Medications - No data to  display   Initial Impression / Assessment and Plan / ED Course  I have reviewed the triage vital signs and the nursing notes.  Pertinent labs & imaging results that were available during my care of the patient were reviewed by me and considered in my medical decision making (see chart for details).   Final Clinical Impressions(s) / ED Diagnoses   Final diagnoses:  Atypical chest pain  Near syncope  Palpitations   Patient with left-sided chest pain and near syncopal episode that occurred while she was at work today.  Not associated with exertion.  Did not actually have syncopal episode.  Also with reports of shortness of breath for the last several days.  On arrival to the ED she is hypertensive but otherwise vitals are stable.  In route EMS noted a heart rate of 120.  They have 324 aspirin and patient refused nitroglycerin.  Will obtain labs, EKG, CXR.   CBC negative BMP negative Trop negative  BNP negative DDimer negative therefor have low suspicion for PE.   EKG with NSR, HR 85, possible RAE. No ischemic changes.  CXR negative  HEART score of 2 based on history and risk factors. Doubt AAA as no widened mediastinum on CXR. No PTX or PNA.   Reviewed records per epic.  Patient was seen 10/01/2018 by her PCP.  According to this note she was told by her PCP to contact Dr. Brigitte Pulse in regards to having a Holter monitor.  She was advised to follow-up with cardiology.  It was thought that her symptoms could be related to uncontrolled anxiety.  She was given a prescription for alprazolam which she reportedly had not been taking before.  TSH was obtained and was normal.  Patient's work-up has been grossly reassuring, feel that she is appropriate for outpatient follow-up with her PCP about her elevated blood pressure and with her cardiologist in regards to her palpitations.  Have advised her to return to the emergency department for any new or worsening symptoms.  She voices understanding the  plan and reasons to return to ED.  All questions answered.  ED Discharge Orders    None       Rodney Booze, PA-C 10/08/18 [place], [place], DO 10/08/18 Mirian Capuchin

## 2018-10-10 ENCOUNTER — Telehealth: Payer: Self-pay | Admitting: Physician Assistant

## 2018-10-10 DIAGNOSIS — R55 Syncope and collapse: Secondary | ICD-10-CM

## 2018-10-10 DIAGNOSIS — R42 Dizziness and giddiness: Secondary | ICD-10-CM

## 2018-10-10 NOTE — Telephone Encounter (Signed)
Copied from [place] [phone]. Topic: Referral - Request for Referral >> Oct 10, 2018 10:54 AM Margot Ables wrote: Has patient seen PCP for this complaint? Yes 10/01/18 with McVey, PA *If NO, is insurance requiring patient see PCP for this issue before PCP can refer them? Referral for which specialty: neurology Preferred provider/office: [facility] Reason for referral: Pt called [facility] to schedule since she is current pt but was advised a new referral is needed from PCP due to different reason for appointment. Pt notes that she was seen in office 10/01/18. This Monday 12/2 pt had additional symptoms and her work called ambulance and she was sent to the ER for syncope, dizziness, blurred vision and headaches. ER advised her that PCP needed to place referral.

## 2018-10-10 NOTE — Telephone Encounter (Signed)
Please advise 

## 2018-10-13 ENCOUNTER — Encounter (HOSPITAL_COMMUNITY): Payer: Self-pay | Admitting: Emergency Medicine

## 2018-10-13 ENCOUNTER — Emergency Department (HOSPITAL_COMMUNITY): Payer: 59

## 2018-10-13 ENCOUNTER — Emergency Department (HOSPITAL_COMMUNITY)
Admission: EM | Admit: 2018-10-13 | Discharge: 2018-10-13 | Disposition: A | Discharge status: Home / Self Care | Payer: 59 | Attending: Emergency Medicine | Admitting: Emergency Medicine

## 2018-10-13 DIAGNOSIS — E119 Type 2 diabetes mellitus without complications: Secondary | ICD-10-CM | POA: Insufficient documentation

## 2018-10-13 DIAGNOSIS — Z79899 Other long term (current) drug therapy: Secondary | ICD-10-CM | POA: Insufficient documentation

## 2018-10-13 DIAGNOSIS — R002 Palpitations: Secondary | ICD-10-CM | POA: Diagnosis not present

## 2018-10-13 DIAGNOSIS — I10 Essential (primary) hypertension: Secondary | ICD-10-CM | POA: Diagnosis not present

## 2018-10-13 DIAGNOSIS — R0602 Shortness of breath: Secondary | ICD-10-CM | POA: Diagnosis not present

## 2018-10-13 DIAGNOSIS — Z87891 Personal history of nicotine dependence: Secondary | ICD-10-CM | POA: Insufficient documentation

## 2018-10-13 DIAGNOSIS — R42 Dizziness and giddiness: Secondary | ICD-10-CM | POA: Diagnosis present

## 2018-10-13 DIAGNOSIS — R079 Chest pain, unspecified: Secondary | ICD-10-CM | POA: Diagnosis not present

## 2018-10-13 DIAGNOSIS — R55 Syncope and collapse: Secondary | ICD-10-CM | POA: Diagnosis not present

## 2018-10-13 LAB — CBC WITH DIFFERENTIAL/PLATELET
Abs Immature Granulocytes: 0.01 10*3/uL (ref 0.00–0.07)
Basophils Absolute: 0 10*3/uL (ref 0.0–0.1)
Basophils Relative: 0 %
Eosinophils Absolute: 0 10*3/uL (ref 0.0–0.5)
Eosinophils Relative: 0 %
HCT: 44.2 % (ref 36.0–46.0)
Hemoglobin: 14.7 g/dL (ref 12.0–15.0)
Immature Granulocytes: 0 %
Lymphocytes Relative: 40 %
Lymphs Abs: 2.5 10*3/uL (ref 0.7–4.0)
MCH: 30.2 pg (ref 26.0–34.0)
MCHC: 33.3 g/dL (ref 30.0–36.0)
MCV: 90.8 fL (ref 80.0–100.0)
Monocytes Absolute: 0.5 10*3/uL (ref 0.1–1.0)
Monocytes Relative: 7 %
Neutro Abs: 3.3 10*3/uL (ref 1.7–7.7)
Neutrophils Relative %: 53 %
Platelets: 267 10*3/uL (ref 150–400)
RBC: 4.87 MIL/uL (ref 3.87–5.11)
RDW: 11.9 % (ref 11.5–15.5)
WBC: 6.3 10*3/uL (ref 4.0–10.5)
nRBC: 0 % (ref 0.0–0.2)

## 2018-10-13 LAB — BASIC METABOLIC PANEL
Anion gap: 11 (ref 5–15)
BUN: 13 mg/dL (ref 6–20)
CO2: 23 mmol/L (ref 22–32)
Calcium: 9.2 mg/dL (ref 8.9–10.3)
Chloride: 105 mmol/L (ref 98–111)
Creatinine, Ser: 1.11 mg/dL — ABNORMAL HIGH (ref 0.44–1.00)
GFR calc Af Amer: >60 mL/min (ref >60)
GFR calc non Af Amer: >60 mL/min (ref >60)
Glucose, Bld: 113 mg/dL — ABNORMAL HIGH (ref 70–99)
Potassium: 4.2 mmol/L (ref 3.5–5.1)
Sodium: 139 mmol/L (ref 135–145)

## 2018-10-13 LAB — I-STAT TROPONIN, ED: Troponin i, poc: 0 ng/mL (ref 0.00–0.08)

## 2018-10-13 MED ORDER — SODIUM CHLORIDE 0.9 % IV BOLUS
1000.0000 mL | Freq: Once | INTRAVENOUS | Status: AC
  Administered 2018-10-13: 1000 mL via INTRAVENOUS

## 2018-10-13 MED ORDER — ASPIRIN 81 MG PO CHEW
324.0000 mg | CHEWABLE_TABLET | Freq: Once | ORAL | Status: AC
  Administered 2018-10-13: 324 mg via ORAL
  Filled 2018-10-13: qty 4

## 2018-10-13 NOTE — ED Notes (Signed)
When doing ortho vitals, pt stated she was seeing spots when laying down.

## 2018-10-13 NOTE — ED Notes (Signed)
Pt verbalized understanding of discharge paperwork and follow-up care.  °

## 2018-10-13 NOTE — Discharge Instructions (Signed)
Call your primary care doctor and your cardiologist on Monday to make an appointment for next week.  Please pick up the Holter monitor from your primary doctor on Monday.   Please return to the emergency department for any new or worsening symptoms.

## 2018-10-13 NOTE — ED Triage Notes (Signed)
Pt reports while she was sitting at work, she began to get dizzy, light-headed and felt her heart race. States she was here last week for similar symptoms.

## 2018-10-13 NOTE — ED Provider Notes (Signed)
[facility] EMERGENCY DEPARTMENT Provider Note   CSN: [id number] Arrival date & time: 10/13/18  1230     History   Chief Complaint Chief Complaint  Patient presents with  . Dizziness    HPI Brooke Turner is a 42 y.o. female.  HPI   Patient is a 42 year old female who presents to the emergency department today for evaluation of a near syncopal episode that occurred while she was at work prior to arrival.  Patient states she felt lightheaded, she did experience palpitations and chills.  She then became hot and cold.  Reports very slight shortness of breath with this episode.  She denies any chest pain.  States that she took her blood pressure medications this morning.  No headaches, vision changes, vertigo, numbness/weakness, vomiting or nausea.  Of note, patient was seen in the emergency department by myself on 10/08/2018 for similar episode.  At that time her laboratory work was negative including a negative d-dimer.  Her chest x-ray was negative.  At that time patient was advised to follow-up with her cardiologist and her PCP in regards to her palpitations.  She states she is called her cardiology office and they think that she can get an appointment next week.  She has also contacted her PCP who has a Holter monitor waiting at the clinic for her that she plans to pick up next week. Negative TSH in November.  Additionally, patient had a negative CTA 1 month ago.  She has no history of diabetes or hyperlipidemia.  She does have a history of hypertension.  Does have distant history of tobacco abuse however quit 4 years ago.  No lower extremity swelling or pain.  No hemoptysis, recent surgery/trauma, recent extended periods of travel, personal history of cancer or DVT/PE.  Patient on OCPs.  Past Medical History:  Diagnosis Date  . Anxiety   . Back pain   . Chest pain    a. 09/2014 Echo: EF 60-65%, no rwma, nl valves;  b. 12/2014 Neg ETT.  . Diabetes mellitus  without complication ([place])   . Essential hypertension    a. 01/2016 Renal duplex: no RAS- ? renal cyst (renal u/s 4/17 - no cyst, nl study); b. 02/2016 24 hr BP Monitor: Mean SBP ~ 138 with Max of 198. Highest pressures noted in evening hours following placement of cuff.  . Fibroid uterus   . GERD (gastroesophageal reflux disease)   . Palpitations    a. 09/2014 nl event monitor.    Patient Active Problem List   Diagnosis Date Noted  . Left facial pain 09/20/2016  . Head trauma 09/20/2016  . Palpitations   . Chest pain   . Left sided numbness 06/11/2015  . Chronic tension-type headache, not intractable 06/10/2015  . Dyspnea 03/18/2015  . Post-traumatic stress reaction 02/25/2015  . Anxiety disorder due to general medical condition with panic attack 02/25/2015  . Subserous leiomyoma of uterus 02/25/2015  . Anxiety about health 02/25/2015  . Deviated nasal septum 12/30/2014  . Chronic rhinitis 12/30/2014  . Chronic maxillary sinusitis 12/30/2014  . Snorings 09/30/2014  . Fatigue 09/26/2014  . Dizziness and giddiness 09/11/2014  . Tachycardia 09/11/2014  . Precordial chest pain 09/11/2014  . Prediabetes 08/26/2014  . Acanthosis nigricans 03/19/2014  . Vitamin D deficiency 07/15/2013  . Large breasts 09/12/2012  . Essential hypertension 02/17/2012  . Contraception management 02/17/2012  . Obesity (BMI 30-39.9) previous BMI was over 40. Patient has lost 70 pounds in the last year  02/17/2012  . Anxiety disorder 02/17/2012  . Chronic back pain 02/17/2012    Past Surgical History:  Procedure Laterality Date  . WISDOM TOOTH EXTRACTION       OB History    Gravida  2   Para  0   Term  0   Preterm  0   AB  2   Living  0     SAB  1   TAB  1   Ectopic  0   Multiple  0   Live Births               Home Medications    Prior to Admission medications   Medication Sig Start Date End Date Taking? Authorizing Provider  ALPRAZolam (NIRAVAM) 0.25 MG dissolvable  tablet Take 1 tablet (0.25 mg total) by mouth at bedtime as needed for anxiety. 10/01/18   McVey, Gelene Mink, PA-C  blood glucose meter kit and supplies Test blood sugar twice daily. Dx code: R73.09 07/22/15   [name], MD  carvedilol (COREG) 3.125 MG tablet Take 1 tablet (3.125 mg total) by mouth 2 (two) times daily with a meal. 07/02/18   [name], MD  famotidine (PEPCID) 20 MG tablet Take 20 mg by mouth 2 (two) times daily.    [provider]  fluticasone (FLONASE) 50 MCG/ACT nasal spray Place 2 sprays into both nostrils daily. Patient taking differently: Place 2 sprays into both nostrils daily as needed for allergies.  10/10/17   Tenna Delaine D, PA-C  LORazepam (ATIVAN) 0.5 MG tablet Take 1 tablet (0.5 mg total) by mouth every 6 (six) hours as needed for anxiety. Patient taking differently: Take 0.25-0.5 mg by mouth every 6 (six) hours as needed for anxiety.  01/18/18   [name], MD  Multiple Vitamins-Minerals (MULTIVITAMIN GUMMIES ADULT PO) Take 2 tablets by mouth.    [provider]  Norethindrone-Ethinyl Estradiol-Fe Biphas (LO LOESTRIN FE) 1 MG-10 MCG / 10 MCG tablet Take 1 tablet by mouth at bedtime.     [provider]  Jonetta Speak LANCETS 52C MISC USE TO TEST BLOOD SUGAR DAILY 10/17/16   [name], MD  [facility] VERIO test strip TEST BLOOD SUGAR DAILY 07/19/16   [name], MD  ranitidine (ZANTAC) 150 MG tablet Take 1 tablet (150 mg total) by mouth 2 (two) times daily. Patient not taking: Reported on 10/01/2018 07/02/18   [name], MD  triamcinolone cream (KENALOG) 0.1 % Apply 1 application topically 2 (two) times daily. Patient not taking: Reported on 09/12/2018 08/06/18   Posey Boyer, MD    Family History Family History  Problem Relation Age of Onset  . Seizures Mother   . Cancer Father        Liver  . Diabetes Father 39  . Hypertension Father   . Heart disease Father 71       CEA, LE [place]  . Alzheimer's disease  Maternal Grandmother   . Heart disease Maternal Grandfather     Social History Social History   Tobacco Use  . Smoking status: Former Smoker    Packs/day: 0.25    Years: 10.00    Pack years: 2.50    Types: Cigarettes    Last attempt to quit: 08/19/2014    Years since quitting: 4.1  . Smokeless tobacco: Never Used  Substance Use Topics  . Alcohol use: No    Alcohol/week: 0.0 standard drinks  . Drug use: No  Allergies   Penicillins; Diltiazem; Tamiflu [oseltamivir phosphate]; and Tinidazole   Review of Systems Review of Systems  Constitutional: Negative for fever.  HENT: Negative for ear pain and sore throat.   Eyes: Negative for pain and visual disturbance.  Respiratory: Positive for shortness of breath (resolved). Negative for cough.   Cardiovascular: Positive for palpitations. Negative for chest pain and leg swelling.  Gastrointestinal: Negative for abdominal pain, diarrhea, nausea and vomiting.  Genitourinary: Negative for dysuria and hematuria.  Musculoskeletal: Negative for myalgias.  Skin: Negative for rash.  Neurological: Negative for headaches.       Near syncope  All other systems reviewed and are negative.    Physical Exam Updated Vital Signs BP 129/82   Pulse 76   Temp 98.1 F (36.7 C) (Oral)   Resp (!) 21   Ht _0  (1.575 m)   Wt 75.3 kg   SpO2 96%   BMI 30.36 kg/m   Physical Exam  Constitutional: She appears well-developed and well-nourished. No distress.  HENT:  Head: Normocephalic and atraumatic.  Mouth/Throat: Oropharynx is clear and moist.  Eyes: Pupils are equal, round, and reactive to light. Conjunctivae and EOM are normal.  No nystagmus  Neck: Neck supple.  Cardiovascular: Normal rate, regular rhythm, normal heart sounds and intact distal pulses.  No murmur heard. Pulmonary/Chest: Effort normal and breath sounds normal. No stridor. No respiratory distress. She has no wheezes.  Abdominal: Soft. Bowel sounds are normal. She  exhibits no distension. There is no tenderness. There is no guarding.  Musculoskeletal:  No calf TTP, erythema, swelling. Mild TTP to midline thoracic spine and to the right thoracic paraspinous muscles. No rashes  Neurological: She is alert.  Oriented. Able to give coherent history. Moving all extremities with normal coordination. Ambulatory without ataxia.  Skin: Skin is warm and dry.  Psychiatric: She has a normal mood and affect.  Nursing note and vitals reviewed.  ED Treatments / Results  Labs (all labs ordered are listed, but only abnormal results are displayed) Labs Reviewed  BASIC METABOLIC PANEL - Abnormal; Notable for the following components:      Result Value   Glucose, Bld 113 (*)    Creatinine, Ser 1.11 (*)    All other components within normal limits  CBC WITH DIFFERENTIAL/PLATELET  I-STAT TROPONIN, ED    EKG EKG Interpretation  Date/Time:  Saturday October 13 2018 12:43:23 EST Ventricular Rate:  89 PR Interval:    QRS Duration: 92 QT Interval:  349 QTC Calculation: 425 R Axis:   41 Text Interpretation:  Sinus rhythm No significant change since last tracing Confirmed by Lajean Saver [phone]) on 10/13/2018 12:56:13 PM   Radiology Dg Chest 2 View  Result Date: 10/13/2018 CLINICAL DATA:  Chest pain, shortness of breath, and tachycardia beginning this morning. EXAM: CHEST - 2 VIEW COMPARISON:  10/08/2018 FINDINGS: The heart size and mediastinal contours are within normal limits. Both lungs are clear. Mild thoracic spine degenerative changes noted. IMPRESSION: No active cardiopulmonary disease. Electronically Signed   By: Earle Gell M.D.   On: 10/13/2018 13:59    Procedures Procedures (including critical care time)  Medications Ordered in ED Medications  aspirin chewable tablet 324 mg (324 mg Oral Given 10/13/18 1307)  sodium chloride 0.9 % bolus 1,000 mL (1,000 mLs Intravenous New Bag/Given 10/13/18 1324)     Initial Impression / Assessment and Plan / ED  Course  I have reviewed the triage vital signs and the nursing notes.  Pertinent labs &  imaging results that were available during my care of the patient were reviewed by me and considered in my medical decision making (see chart for details).     Final Clinical Impressions(s) / ED Diagnoses   Final diagnoses:  Palpitations  Near syncope   Patient presenting with palpitations.  Recently seen here for same with negative work-up.  Has been seen by her PCP for same with negative work-ups.  Has contacted PCP in regards to symptoms and has Holter monitor waiting for her at clinic.  Has also been in contact with cardiology who she plans to have an appointment with next week.  On arrival to the ED symptoms have resolved.  Vital signs are stable.  Lungs clear bilaterally.  Cardiac exam is benign.  No lower extremity swelling or pain.  Distal pulses are symmetric.  CBC, BMP, troponin are reassuring.  EKG with normal sinus rhythm, unchanged from prior.  Wells score is 0. Negative ddimer 1 week ago and negative CTA 1 month ago. Have very low suspicion for PE to cause transient episode of tachycardia and feel that repeat imaging/dimer are not currently indicated.  ECG with NSR, no significant change since last tracing.   CXR negative for PTX, PNA, pleural effusions, widened mediastinum, or cardiomegaly.   Have advised pt to f/u with her pcp and her cardiologist as planned. Specific return precautions were discussed including that she needs to f/u about her elevated blood pressure and may need her BP medications titrated. She voices an understanding of the plan and reasons to return. All questions answered.  ED Discharge Orders    None       Rodney Booze, [place] 10/13/18 1459    Lajean Saver, MD 10/14/18 [phone]

## 2018-10-15 ENCOUNTER — Emergency Department (HOSPITAL_COMMUNITY): Payer: 59

## 2018-10-15 ENCOUNTER — Encounter (HOSPITAL_COMMUNITY): Payer: Self-pay | Admitting: Emergency Medicine

## 2018-10-15 ENCOUNTER — Ambulatory Visit: Payer: Self-pay

## 2018-10-15 ENCOUNTER — Other Ambulatory Visit: Payer: Self-pay

## 2018-10-15 ENCOUNTER — Emergency Department (HOSPITAL_COMMUNITY)
Admission: EM | Admit: 2018-10-15 | Discharge: 2018-10-16 | Disposition: A | Discharge status: Home / Self Care | Payer: 59 | Attending: Emergency Medicine | Admitting: Emergency Medicine

## 2018-10-15 DIAGNOSIS — Z793 Long term (current) use of hormonal contraceptives: Secondary | ICD-10-CM | POA: Diagnosis not present

## 2018-10-15 DIAGNOSIS — Z87891 Personal history of nicotine dependence: Secondary | ICD-10-CM | POA: Insufficient documentation

## 2018-10-15 DIAGNOSIS — R110 Nausea: Secondary | ICD-10-CM | POA: Diagnosis not present

## 2018-10-15 DIAGNOSIS — R072 Precordial pain: Secondary | ICD-10-CM | POA: Diagnosis present

## 2018-10-15 DIAGNOSIS — E119 Type 2 diabetes mellitus without complications: Secondary | ICD-10-CM | POA: Diagnosis not present

## 2018-10-15 DIAGNOSIS — D259 Leiomyoma of uterus, unspecified: Secondary | ICD-10-CM | POA: Diagnosis not present

## 2018-10-15 DIAGNOSIS — R1084 Generalized abdominal pain: Secondary | ICD-10-CM | POA: Diagnosis not present

## 2018-10-15 DIAGNOSIS — Z79899 Other long term (current) drug therapy: Secondary | ICD-10-CM | POA: Insufficient documentation

## 2018-10-15 DIAGNOSIS — R079 Chest pain, unspecified: Secondary | ICD-10-CM | POA: Diagnosis not present

## 2018-10-15 DIAGNOSIS — I10 Essential (primary) hypertension: Secondary | ICD-10-CM | POA: Diagnosis not present

## 2018-10-15 LAB — URINALYSIS, ROUTINE W REFLEX MICROSCOPIC
Bilirubin Urine: NEGATIVE
Glucose, UA: NEGATIVE mg/dL
Ketones, ur: NEGATIVE mg/dL
Leukocytes, UA: NEGATIVE
Nitrite: NEGATIVE
Protein, ur: NEGATIVE mg/dL
Specific Gravity, Urine: 1.009 (ref 1.005–1.030)
pH: 6 (ref 5.0–8.0)

## 2018-10-15 LAB — COMPREHENSIVE METABOLIC PANEL
ALT: 16 U/L (ref 0–44)
AST: 19 U/L (ref 15–41)
Albumin: 4 g/dL (ref 3.5–5.0)
Alkaline Phosphatase: 45 U/L (ref 38–126)
Anion gap: 9 (ref 5–15)
BUN: 12 mg/dL (ref 6–20)
CO2: 26 mmol/L (ref 22–32)
Calcium: 8.7 mg/dL — ABNORMAL LOW (ref 8.9–10.3)
Chloride: 101 mmol/L (ref 98–111)
Creatinine, Ser: 1.05 mg/dL — ABNORMAL HIGH (ref 0.44–1.00)
GFR calc Af Amer: >60 mL/min (ref >60)
GFR calc non Af Amer: >60 mL/min (ref >60)
Glucose, Bld: 107 mg/dL — ABNORMAL HIGH (ref 70–99)
Potassium: 3.8 mmol/L (ref 3.5–5.1)
Sodium: 136 mmol/L (ref 135–145)
Total Bilirubin: 1.9 mg/dL — ABNORMAL HIGH (ref 0.3–1.2)
Total Protein: 7.2 g/dL (ref 6.5–8.1)

## 2018-10-15 LAB — CBC
HCT: 42.6 % (ref 36.0–46.0)
Hemoglobin: 14.4 g/dL (ref 12.0–15.0)
MCH: 30.2 pg (ref 26.0–34.0)
MCHC: 33.8 g/dL (ref 30.0–36.0)
MCV: 89.3 fL (ref 80.0–100.0)
Platelets: 226 10*3/uL (ref 150–400)
RBC: 4.77 MIL/uL (ref 3.87–5.11)
RDW: 11.9 % (ref 11.5–15.5)
WBC: 5.7 10*3/uL (ref 4.0–10.5)
nRBC: 0 % (ref 0.0–0.2)

## 2018-10-15 LAB — I-STAT TROPONIN, ED: Troponin i, poc: 0 ng/mL (ref 0.00–0.08)

## 2018-10-15 LAB — I-STAT BETA HCG BLOOD, ED (MC, WL, AP ONLY): I-stat hCG, quantitative: <5 m[IU]/mL (ref <5)

## 2018-10-15 LAB — LIPASE, BLOOD: Lipase: 40 U/L (ref 11–51)

## 2018-10-15 MED ORDER — IOHEXOL 300 MG/ML  SOLN
100.0000 mL | Freq: Once | INTRAMUSCULAR | Status: AC | PRN
  Administered 2018-10-15: 100 mL via INTRAVENOUS

## 2018-10-15 MED ORDER — ONDANSETRON HCL 4 MG/2ML IJ SOLN
4.0000 mg | Freq: Once | INTRAMUSCULAR | Status: AC
  Administered 2018-10-15: 4 mg via INTRAVENOUS
  Filled 2018-10-15: qty 2

## 2018-10-15 MED ORDER — SODIUM CHLORIDE 0.9 % IV SOLN
INTRAVENOUS | Status: DC
  Administered 2018-10-15: via INTRAVENOUS

## 2018-10-15 NOTE — Telephone Encounter (Signed)
Pt. Returned call.  Stated she had to disconnect with previous phone call.   Advised pt. that a note was sent to Dr. Brigitte Pulse informing her of recent symptoms.  The pt. wanted Dr. Brigitte Pulse to be made aware of the recent episodes of feeling faint, are completely different than previous anxiety episodes.   Informed pt. that there is an order in her chart from August for a Holter Monitor.  Encouraged her to call [facility] when they open at 8:00 AM, tomorrow, to try to get an appt. For Holter monitor placement.  Pt. agreed with plan.  Stated "I still need the appt. with Dr. Brigitte Pulse, as well as Cardiology."  Advised to anticipate a call from [facility] re: a f/u appt.

## 2018-10-15 NOTE — ED Notes (Signed)
Patient transported to CT 

## 2018-10-15 NOTE — Telephone Encounter (Signed)
Pt. returned call.  Explained she has been in the ER x 2 in last week, for near fainting episodes.  Her most recent ER visit was 10/13/18; c/o sudden onset of light-headed feeling, "like something dropped", while sitting at desk on Saturday.  Stated she felt a "tickle" in her chest, and felt nauseated at that time.  Reported this episode was not as severe as the previous episode, that occurred on 12/2.  Currently, denies chest pain.  Stated she is very tired and has felt this way for about 3-4 weeks.  Stated she is following through with ER recommendation to schedule appt. With PCP.  No available appts. With PCP this week.  The pt. Stated she is also supposed to have been referred for a Holter Monitor, through [facility].  Stated she has not been able to get through to heart care on their phone line.    Called [facility]; spoke with Tanya; no available appts. Tomorrow or Wed. With PCP or another provider  Advised Tanya, will call [facility] to try to facilitate the Holter Monitor appt., and will send Triage note over for Dr. Brigitte Pulse to review.   Attempted to give pt. Update on trying to sched. An appt. With PCP office; pt. Had disconnected the phone at this point.  Attempted to call her back; left vm. To call back to office.     Called [facility] re: Holter Monitor.  Was advised there is no order placed and no referral in place for a Holter Monitor.    Returned call to [employer].  Spoke to [place].  Will send Triage note for Dr. Brigitte Pulse to be aware of situation.       (No Triage protocol was done, since pt. Has been in ER x 2 within last week for near syncopal episodes.)

## 2018-10-15 NOTE — ED Provider Notes (Addendum)
[facility] EMERGENCY DEPARTMENT Provider Note   CSN: [id number] Arrival date & time: 10/15/18  1942     History   Chief Complaint Chief Complaint  Patient presents with  . Chest Pain  . Abdominal Pain  . Nausea  . Anxiety    HPI Brooke Turner is a 42 y.o. female.  Patient with several visits recently.  Most of these have been for syncope and chest pain.  Patient also has a history of anxiety.  Primary care doctor has her referred for Holter monitoring and referral to cardiology.  Patient had a normal echo in 2015.  Patient does have a history of diabetes and hypertension.  Patient states that at 11:00 this morning started with generalized abdominal pain that was crampy in nature.  Associated with some nausea but no vomiting.  Some loose bowel movements.  And then in the afternoon went into anterior chest pain.  Similar to the chest pain she has had in the past with negative work-ups.  Patient has not been evaluated recently for abdominal pain.  Patient has had CT NGO in the past 4 weeks which was negative for PE.  Patient's troponins have all been negative.     Past Medical History:  Diagnosis Date  . Anxiety   . Back pain   . Chest pain    a. 09/2014 Echo: EF 60-65%, no rwma, nl valves;  b. 12/2014 Neg ETT.  . Diabetes mellitus without complication ([place])   . Essential hypertension    a. 01/2016 Renal duplex: no RAS- ? renal cyst (renal u/s 4/17 - no cyst, nl study); b. 02/2016 24 hr BP Monitor: Mean SBP ~ 138 with Max of 198. Highest pressures noted in evening hours following placement of cuff.  . Fibroid uterus   . GERD (gastroesophageal reflux disease)   . Palpitations    a. 09/2014 nl event monitor.    Patient Active Problem List   Diagnosis Date Noted  . Left facial pain 09/20/2016  . Head trauma 09/20/2016  . Palpitations   . Chest pain   . Left sided numbness 06/11/2015  . Chronic tension-type headache, not intractable 06/10/2015  .  Dyspnea 03/18/2015  . Post-traumatic stress reaction 02/25/2015  . Anxiety disorder due to general medical condition with panic attack 02/25/2015  . Subserous leiomyoma of uterus 02/25/2015  . Anxiety about health 02/25/2015  . Deviated nasal septum 12/30/2014  . Chronic rhinitis 12/30/2014  . Chronic maxillary sinusitis 12/30/2014  . Snorings 09/30/2014  . Fatigue 09/26/2014  . Dizziness and giddiness 09/11/2014  . Tachycardia 09/11/2014  . Precordial chest pain 09/11/2014  . Prediabetes 08/26/2014  . Acanthosis nigricans 03/19/2014  . Vitamin D deficiency 07/15/2013  . Large breasts 09/12/2012  . Essential hypertension 02/17/2012  . Contraception management 02/17/2012  . Obesity (BMI 30-39.9) previous BMI was over 40. Patient has lost 70 pounds in the last year 02/17/2012  . Anxiety disorder 02/17/2012  . Chronic back pain 02/17/2012    Past Surgical History:  Procedure Laterality Date  . WISDOM TOOTH EXTRACTION       OB History    Gravida  2   Para  0   Term  0   Preterm  0   AB  2   Living  0     SAB  1   TAB  1   Ectopic  0   Multiple  0   Live Births  Home Medications    Prior to Admission medications   Medication Sig Start Date End Date Taking? Authorizing Provider  ALPRAZolam (NIRAVAM) 0.25 MG dissolvable tablet Take 1 tablet (0.25 mg total) by mouth at bedtime as needed for anxiety. 10/01/18  Yes McVey, Gelene Mink, PA-C  carvedilol (COREG) 3.125 MG tablet Take 1 tablet (3.125 mg total) by mouth 2 (two) times daily with a meal. 07/02/18  Yes [name], MD  famotidine (PEPCID) 20 MG tablet Take 20 mg by mouth 2 (two) times daily.   Yes [provider]  fluticasone (FLONASE) 50 MCG/ACT nasal spray Place 2 sprays into both nostrils daily. Patient taking differently: Place 2 sprays into both nostrils daily as needed (seasonal allergies).  10/10/17  Yes Timmothy Euler, [place] D, PA-C  LORazepam (ATIVAN) 0.5 MG tablet Take  1 tablet (0.5 mg total) by mouth every 6 (six) hours as needed for anxiety. Patient taking differently: Take 0.25 mg by mouth every 6 (six) hours as needed for anxiety.  01/18/18  Yes [name], MD  Multiple Vitamins-Minerals (MULTIVITAMIN GUMMIES ADULT PO) Take 2 tablets by mouth daily.    Yes [provider]  Norethindrone-Ethinyl Estradiol-Fe Biphas (LO LOESTRIN FE) 1 MG-10 MCG / 10 MCG tablet Take 1 tablet by mouth at bedtime.    Yes [provider]  triamcinolone cream (KENALOG) 0.1 % Apply 1 application topically 2 (two) times daily. Patient taking differently: Apply 1 application topically 2 (two) times daily as needed (rash/itching).  08/06/18  Yes Posey Boyer, MD  blood glucose meter kit and supplies Test blood sugar twice daily. Dx code: R73.09 07/22/15   [name], MD  [facility] DELICA LANCETS 10R MISC USE TO TEST BLOOD SUGAR DAILY 10/17/16   [name], MD  [facility] VERIO test strip TEST BLOOD SUGAR DAILY 07/19/16   [name], MD  ranitidine (ZANTAC) 150 MG tablet Take 1 tablet (150 mg total) by mouth 2 (two) times daily. Patient not taking: Reported on 10/01/2018 07/02/18   [name], MD    Family History Family History  Problem Relation Age of Onset  . Seizures Mother   . Cancer Father        Liver  . Diabetes Father 34  . Hypertension Father   . Heart disease Father 53       CEA, LE [place]  . Alzheimer's disease Maternal Grandmother   . Heart disease Maternal Grandfather     Social History Social History   Tobacco Use  . Smoking status: Former Smoker    Packs/day: 0.25    Years: 10.00    Pack years: 2.50    Types: Cigarettes    Last attempt to quit: 08/19/2014    Years since quitting: 4.1  . Smokeless tobacco: Never Used  Substance Use Topics  . Alcohol use: No    Alcohol/week: 0.0 standard drinks  . Drug use: No     Allergies   Penicillins; Diltiazem; Tamiflu [oseltamivir phosphate]; and Tinidazole   Review of  Systems Review of Systems  Constitutional: Negative for fever.  HENT: Negative for congestion.   Eyes: Negative for visual disturbance.  Respiratory: Negative for shortness of breath.   Cardiovascular: Positive for chest pain.  Gastrointestinal: Positive for abdominal pain and nausea. Negative for blood in stool and vomiting.  Genitourinary: Negative for dysuria.  Musculoskeletal: Negative for back pain.  Skin: Negative for rash.  Neurological: Positive for syncope.  Hematological: Does not bruise/bleed easily.  Psychiatric/Behavioral: Negative  for confusion.     Physical Exam Updated Vital Signs BP 133/83   Pulse 91   Temp 98 F (36.7 C) (Oral)   Resp 19   Ht 1.626 m (5' 4" )   Wt 74.8 kg   SpO2 100%   BMI 28.32 kg/m   Physical Exam  Constitutional: She is oriented to person, place, and time. She appears well-developed and well-nourished. No distress.  HENT:  Head: Normocephalic and atraumatic.  Mouth/Throat: Oropharynx is clear and moist.  Eyes: Pupils are equal, round, and reactive to light. Conjunctivae and EOM are normal.  Neck: Normal range of motion. Neck supple.  Cardiovascular: Normal rate, regular rhythm and intact distal pulses.  Pulmonary/Chest: Effort normal and breath sounds normal. No respiratory distress.  Abdominal: Soft. Bowel sounds are normal. She exhibits no distension. There is no tenderness.  Musculoskeletal: Normal range of motion. She exhibits no edema.  Neurological: She is alert and oriented to person, place, and time. No cranial nerve deficit or sensory deficit. She exhibits normal muscle tone. Coordination normal.  Skin: Skin is warm. Capillary refill takes less than 2 seconds. No rash noted.  Nursing note and vitals reviewed.    ED Treatments / Results  Labs (all labs ordered are listed, but only abnormal results are displayed) Labs Reviewed  COMPREHENSIVE METABOLIC PANEL - Abnormal; Notable for the following components:      Result  Value   Glucose, Bld 107 (*)    Creatinine, Ser 1.05 (*)    Calcium 8.7 (*)    Total Bilirubin 1.9 (*)    All other components within normal limits  URINALYSIS, ROUTINE W REFLEX MICROSCOPIC - Abnormal; Notable for the following components:   Color, Urine STRAW (*)    Hgb urine dipstick MODERATE (*)    Bacteria, UA RARE (*)    All other components within normal limits  CBC  LIPASE, BLOOD  I-STAT TROPONIN, ED  I-STAT BETA HCG BLOOD, ED ([facility], [facility], [facility] ONLY)    EKG EKG Interpretation  Date/Time:  Monday October 15 2018 21:00:21 EST Ventricular Rate:  102 PR Interval:  136 QRS Duration: 80 QT Interval:  328 QTC Calculation: 427 R Axis:   54 Text Interpretation:  Sinus tachycardia Possible Left atrial enlargement Borderline ECG No significant change since last tracing Confirmed by Fredia Sorrow [id number] on 10/15/2018 10:23:24 PM   Radiology Dg Chest 2 View  Result Date: 10/15/2018 CLINICAL DATA:  42 y/o  F; chest pain. EXAM: CHEST - 2 VIEW COMPARISON:  10/13/2018 chest radiograph. FINDINGS: Stable heart size and mediastinal contours are within normal limits. Both lungs are clear. The visualized skeletal structures are unremarkable. IMPRESSION: No active cardiopulmonary disease. Electronically Signed   By: Kristine Garbe M.D.   On: 10/15/2018 20:31    Procedures Procedures (including critical care time)  Medications Ordered in ED Medications  0.9 %  sodium chloride infusion (has no administration in time range)  ondansetron (ZOFRAN) injection 4 mg (has no administration in time range)     Initial Impression / Assessment and Plan / ED Course  I have reviewed the triage vital signs and the nursing notes.  Pertinent labs & imaging results that were available during my care of the patient were reviewed by me and considered in my medical decision making (see chart for details).     Patient's work-up here from a cardiac standpoint without any acute findings.  Troponin  was negative.  Chest x-ray negative heart rates now in the 90s.  No  further tachycardia.  No acute changes on EKG.  Patient's liver function tests were normal CBC with no leukocytosis or anemia.  Pregnancy test was negative.  Urinalysis negative for urinary tract infection. Exception on the liver function test was bilirubin was 1.9.  Patient does not need any further chest pain work-up however due to the belly pain we will go ahead and get CT abdomen pelvis to be complete.  Patient nontoxic no acute distress.  Patient will be turned over to Dr. Leonides Schanz.  Disposition will be based on the abdominal CT findings.  Final Clinical Impressions(s) / ED Diagnoses   Final diagnoses:  Precordial pain  Generalized abdominal pain    ED Discharge Orders    None       Fredia Sorrow, MD 10/15/18 [id number]    Fredia Sorrow, MD 10/16/18 [phone]

## 2018-10-15 NOTE — ED Notes (Signed)
Pt returned from CT °

## 2018-10-15 NOTE — ED Triage Notes (Signed)
Pt reports 10/10 generalized and epigastric abd pain that started this morning. Pt reports a dry cough that started today with some acid reflux. Pt then reports noticing that her heart rate was elevating and was feeling like her heart was pounding, started having generalized 6/10 cp that radiated to her left shoulder. Pt also reports nausea and loose stools. Pt reports she took 0.25 of Ativan pta. Hx of HTN, compliant with medications.

## 2018-10-15 NOTE — Telephone Encounter (Signed)
Pt. Called to get an appt. For episode of nearly passing out last week; seen in ER for this.  Was advised to f/u in the office with PCP.  As agent was trying to transfer the call to the nurse, the call was dropped.  Attempted to call pt. Back, immediately after call was dropped; left vm to have pt. Call back to office.

## 2018-10-16 DIAGNOSIS — H04123 Dry eye syndrome of bilateral lacrimal glands: Secondary | ICD-10-CM | POA: Diagnosis not present

## 2018-10-16 NOTE — ED Notes (Signed)
Patient verbalizes understanding of discharge instructions. Opportunity for questioning and answers were provided. Armband removed by staff, pt discharged from ED ambulatory.   

## 2018-10-16 NOTE — ED Provider Notes (Signed)
12:00 AM  Assumed care.  Patient is a 42 year old female here with nonspecific chest pain, abdominal pain.  Has been seen here frequently for chest pain and has had numerous negative troponins and a recent negative CTA of the chest on November 5.  CT of the abdomen pelvis pending for further evaluation.  Labs here are unremarkable.  No leukocytosis, normal LFTs and lipase, normal renal function.  Pregnancy test negative.    CT scan is unremarkable for acute abnormality.  She does have uterine fibroids and I have discussed this with the patient.  She states she was aware she had fibroids.  I recommended close outpatient follow-up with her primary care doctor.  Recommended alternating Tylenol, Motrin, over-the-counter omeprazole as needed for abdominal pain.  Abdominal pain seems very nonspecific.  She appears very comfortable currently.  Patient is comfortable with plan for discharge home.  Vital signs normal.   At this time, I do not feel there is any life-threatening condition present. I have reviewed and discussed all results (EKG, imaging, lab, urine as appropriate) and exam findings with patient/family. I have reviewed nursing notes and appropriate previous records.  I feel the patient is safe to be discharged home without further emergent workup and can continue workup as an outpatient as needed. Discussed usual and customary return precautions. Patient/family verbalize understanding and are comfortable with this plan.  Outpatient follow-up has been provided as needed. All questions have been answered.    Climmie Cronce, Delice Bison, DO 10/16/18 [phone]

## 2018-10-16 NOTE — Telephone Encounter (Signed)
Pt. Called back today to let Dr. Brigitte Pulse know that she contacted [facility] re: Holter monitor.  Was advised there is no longer an order for the Holter monitor; pt. Stated she was told to call PCP to have another order placed.  Also pt. Is asking about scheduling an ER f/u appt. With D.r Brigitte Pulse.  Noted that there is no availability with Dr. Brigitte Pulse until 11/20/2018. Advised will call the office to make aware of the above.  Agreed with plan.  Phone call to [facility].  Spoke with [employer]; informed of the information above.  Stated she will discuss with Dr. Brigitte Pulse, and will call the pt. back to make aware of plan.

## 2018-10-17 ENCOUNTER — Telehealth: Payer: Self-pay | Admitting: Cardiology

## 2018-10-17 NOTE — Telephone Encounter (Signed)
Neuro referral placed.

## 2018-10-17 NOTE — Telephone Encounter (Signed)
Ok to [place] into a 4 pm slot.  Pt is seeing cardiology tomorrow 12/12 and they will order monitor then I presume

## 2018-10-17 NOTE — Telephone Encounter (Signed)
Spoke with the pt and she has been having presyncopal episodes on and off over that past few weeks.. She denies passing out but it happens all of a sudden and not associated with position changes.. She only has a few palpitations... She does not recall having palpitations during her presyncopal spells... She says the room starts to get dark and her hearing decreases and she gets dizzy but never loses consciousness. The schedulers have moved up her appt from 10/26/18 to tomorrow 10/17/18 with Jory Sims NP... She will try to limit her driving and will well hydrate today and avoid any OTC meds until her appt. Pt verbalized understanding.

## 2018-10-17 NOTE — Telephone Encounter (Signed)
Please see note below. 

## 2018-10-17 NOTE — Telephone Encounter (Signed)
° ° °  Patient calling requesting order for holter monitor.  Patient states she has been to ED several times for near syncopal episodes. No current order for monitor in system. Requesting Dr Percival Spanish to order.  There is an open telephone note to PCP [name] to request order as well.

## 2018-10-18 ENCOUNTER — Ambulatory Visit: Payer: 59 | Admitting: Adult Health

## 2018-10-18 ENCOUNTER — Encounter: Payer: Self-pay | Admitting: Adult Health

## 2018-10-18 ENCOUNTER — Telehealth: Payer: Self-pay | Admitting: Family Medicine

## 2018-10-18 VITALS — BP 144/92 | HR 72 | Ht 64.0 in | Wt 168.0 lb

## 2018-10-18 DIAGNOSIS — R002 Palpitations: Secondary | ICD-10-CM | POA: Diagnosis not present

## 2018-10-18 DIAGNOSIS — Z79899 Other long term (current) drug therapy: Secondary | ICD-10-CM | POA: Diagnosis not present

## 2018-10-18 DIAGNOSIS — R55 Syncope and collapse: Secondary | ICD-10-CM

## 2018-10-18 NOTE — Telephone Encounter (Signed)
Patient was called and LVM for her to return call to get her an appointment per Dr Brigitte Pulse to [facility]. Please see telephone encounter/nurse triage from 10/15/18

## 2018-10-18 NOTE — Telephone Encounter (Signed)
Copied from [id number]. Topic: Appointment Scheduling - Scheduling Inquiry for Clinic >> Oct 17, 2018  3:41 PM Vernona Rieger wrote: Patient is calling to see if Dr Brigitte Pulse would open up a same day slot for her to move up her January appointment. She advised me that sometimes the office does this for her. Please advise.

## 2018-10-18 NOTE — Progress Notes (Signed)
Cardiology Office Note   Date:  10/18/2018   ID:  Brooke Turner, DOB [birth year], MRN [id number]  PCP:  [name], MD  Cardiologist:  [facility] Chief Complaint  Patient presents with  . Near Syncope  . Palpitations     History of Present Illness: Brooke Turner is a 42 y.o. female who presents for ongoing assessment and management of hypertension, palpitations, and near syncopal episodes. Called our office yesterday requesting Holter monitor. Appointment scheduled today to discuss, as she has worsening symptoms of dizziness and near syncope. She was advised to increase her fluids.   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 She has had 3 episodes of rapid HR, near syncope and dizziness. She has been to the ER twice over the last month for chest pain (burning pain)and near syncope, once in November and once in October. She states that she has been sent home each time 'because they cannot find anything."  She was advised to follow up with cardiology. She has become very anxious about her symptoms.   Past Medical History:  Diagnosis Date  . Anxiety   . Back pain   . Chest pain    a. 09/2014 Echo: EF 60-65%, no rwma, nl valves;  b. 12/2014 Neg ETT.  . Diabetes mellitus without complication ([place])   . Essential hypertension    a. 01/2016 Renal duplex: no RAS- ? renal cyst (renal u/s 4/17 - no cyst, nl study); b. 02/2016 24 hr BP Monitor: Mean SBP ~ 138 with Max of 198. Highest pressures noted in evening hours following placement of cuff.  . Fibroid uterus   . GERD (gastroesophageal reflux disease)   . Palpitations    a. 09/2014 nl event monitor.    Past Surgical History:  Procedure Laterality Date  . WISDOM TOOTH EXTRACTION       Current Outpatient Medications  Medication Sig Dispense Refill  . ALPRAZolam (NIRAVAM) 0.25 MG dissolvable tablet Take 1 tablet (0.25 mg total) by mouth at bedtime as needed for anxiety. 20 tablet 1  . carvedilol (COREG) 3.125  MG tablet Take 1 tablet (3.125 mg total) by mouth 2 (two) times daily with a meal. 180 tablet 1  . famotidine (PEPCID) 20 MG tablet Take 20 mg by mouth 2 (two) times daily.    . fluticasone (FLONASE) 50 MCG/ACT nasal spray Place 2 sprays into both nostrils daily. (Patient taking differently: Place 2 sprays into both nostrils daily as needed (seasonal allergies). ) 16 g 0  . LORazepam (ATIVAN) 0.5 MG tablet Take 1 tablet (0.5 mg total) by mouth every 6 (six) hours as needed for anxiety. (Patient taking differently: Take 0.25 mg by mouth every 6 (six) hours as needed for anxiety. ) 30 tablet 2  . Multiple Vitamins-Minerals (MULTIVITAMIN GUMMIES ADULT PO) Take 2 tablets by mouth daily.     . Norethindrone-Ethinyl Estradiol-Fe Biphas (LO LOESTRIN FE) 1 MG-10 MCG / 10 MCG tablet Take 1 tablet by mouth at bedtime.     . triamcinolone cream (KENALOG) 0.1 % Apply 1 application topically 2 (two) times daily. (Patient taking differently: Apply 1 application topically 2 (two) times daily as needed (rash/itching). ) 30 g 0  . blood glucose meter kit and supplies Test blood sugar twice daily. Dx code: R73.09 1 each 0  . ONETOUCH DELICA LANCETS 63J MISC USE TO TEST BLOOD SUGAR DAILY 100 each 0  . ONETOUCH VERIO test strip TEST BLOOD SUGAR DAILY 25 each 10   No  current facility-administered medications for this visit.     Allergies:   Penicillins; Tamiflu [oseltamivir phosphate]; Tinidazole; and Diltiazem    Social History:  The patient  reports that she quit smoking about 4 years ago. Her smoking use included cigarettes. She has a 2.50 pack-year smoking history. She has never used smokeless tobacco. She reports that she does not drink alcohol or use drugs.   Family History:  The patient's family history includes Alzheimer's disease in her maternal grandmother; Cancer in her father; Diabetes (age of onset: 52) in her father; Heart disease in her maternal grandfather; Heart disease (age of onset: 39) in her  father; Hypertension in her father; Seizures in her mother.    ROS: All other systems are reviewed and negative. Unless otherwise mentioned in H&P    PHYSICAL EXAM: VS:  BP (!) 144/92   Pulse 72   Ht 5' 4"  (1.626 m)   Wt 168 lb (76.2 kg)   BMI 28.84 kg/m  , BMI Body mass index is 28.84 kg/m. GEN: Well nourished, well developed, in no acute distress HEENT: normal Neck: no JVD, carotid bruits, or masses Cardiac: RRR; no murmurs, rubs, or gallops,no edema  Respiratory:  Clear to auscultation bilaterally, normal work of breathing GI: soft, nontender, nondistended, + BS MS: no deformity or atrophy Skin: warm and dry, no rash Neuro:  Strength and sensation are intact Psych: euthymic mood, full affect   EKG:  NSR no evidence of WPW or arrhythmias. Rate of 63 bpm  Recent Labs: 10/01/2018: TSH 0.846 10/08/2018: B Natriuretic Peptide 36.0 10/15/2018: ALT 16; BUN 12; Creatinine, Ser 1.05; Hemoglobin 14.4; Platelets 226; Potassium 3.8; Sodium 136    Lipid Panel    Component Value Date/Time   CHOL 172 10/25/2016 1850   TRIG 87 10/25/2016 1850   HDL 49 10/25/2016 1850   CHOLHDL 3.5 10/25/2016 1850   LDLCALC 106 (H) 10/25/2016 1850   LDLDIRECT 101 (H) 10/25/2016 1850      Wt Readings from Last 3 Encounters:  10/18/18 168 lb (76.2 kg)  10/15/18 165 lb (74.8 kg)  10/13/18 166 lb 0.1 oz (75.3 kg)      Other studies Reviewed: Echocardiogram 10-15-16  Left ventricle: The cavity size was normal. Systolic function was normal. The estimated ejection fraction was in the range of 60% to 65%. Wall motion was normal; there were no regional wall motion abnormalities. Left ventricular diastolic function parameters were normal. - Aortic valve: Trileaflet; normal thickness leaflets. There was no regurgitation. - Aortic root: The aortic root was normal in size. - Mitral valve: Structurally normal valve. Transvalvular velocity was within the normal range. There was no  evidence for stenosis. There was no regurgitation. - Left atrium: The atrium was normal in size. - Right ventricle: Systolic function was normal. - Right atrium: The atrium was normal in size. - Pulmonic valve: There was trivial regurgitation. - Pulmonary arteries: Systolic pressure was within the normal range. - Pericardium, extracardiac: There was no pericardial effusion.  Impressions:  - Normal study.  ASSESSMENT AND PLAN:  1. Near syncope: I will place a 30 day cardiac monitor to evaluate for rapid HR, or any arrhythmia which may be causing her symptoms. She states she feels them about twice a week. I have advised her to use the monitor alert button for any feelings of near syncope.   2. Palpitations: Will recheck her TSH and also do a pregnancy test. She is on oral birth control. She is not drinking any caffeinated beverages  or using elicit drugs. She has been feeling nauseous in the mornings as well. No diarrhea.    Current medicines are reviewed at length with the patient today.    Labs/ tests ordered today include: Pregnancy test and 30-day cardiac monitor.   Phill Myron. West Pugh, ANP, AACC   10/18/2018 1:44 PM    [place] [address] Office [phone] Fax [phone]

## 2018-10-18 NOTE — Patient Instructions (Addendum)
Medication Instructions:  NO CHANGES- Your physician recommends that you continue on your current medications as directed. Please refer to the Current Medication list given to you today. If you need a refill on your cardiac medications before your next appointment, please call your pharmacy.  Labwork: BMET, MAG,HCG AND TSH TODAY HERE IN OUR OFFICE AT [facility] Take the provided lab slips with you to the lab for your blood draw.   If you have labs (blood work) drawn today and your tests are completely normal, you will receive your results only by [place] (if you have MyChart) -OR- A paper copy in the mail.  If you have any lab test that is abnormal or we need to change your treatment, we will call you to review these results.  Testing/Procedures: Your physician has recommended that you wear an event monitor-30 day. Event monitors are medical devices that record the heart's electrical activity. Doctors most often [place] these monitors to diagnose arrhythmias. Arrhythmias are problems with the speed or rhythm of the heartbeat. The monitor is a small, portable device. You can wear one while you do your normal daily activities. This is usually used to diagnose what is causing palpitations/syncope (passing out). This will be performed at our [facility] location - [address], [address].  Follow-Up: You will need a follow up appointment in 6 weeks-2 [place].  You may see Minus Breeding, MD  Jory Sims, DNP, AACC  or one of the following Advanced Practice Providers on your designated Care Team:  Rosaria Ferries, PA-C   Jory Sims, DNP, [place], you and your health needs are our priority.  As part of our continuing mission to provide you with exceptional heart care, we have created designated Provider Care Teams.  These Care Teams include your primary Cardiologist (physician) and Advanced Practice Providers (APPs -  Physician Assistants and Nurse Practitioners) who  all work together to provide you with the care you need, when you need it.

## 2018-10-18 NOTE — Telephone Encounter (Signed)
LVM for pt to call the office and schedule an appt with Dr. Brigitte Pulse. Brigitte Pulse has advised me to double book her for today. If pt calls back, please call [place] and ask for Tanya. Thank you!

## 2018-10-19 ENCOUNTER — Other Ambulatory Visit: Payer: Self-pay

## 2018-10-19 ENCOUNTER — Ambulatory Visit: Payer: 59 | Admitting: Family Medicine

## 2018-10-19 ENCOUNTER — Telehealth: Payer: Self-pay

## 2018-10-19 ENCOUNTER — Encounter: Payer: Self-pay | Admitting: Family Medicine

## 2018-10-19 VITALS — BP 138/82 | HR 69 | Temp 98.0°F | Resp 16 | Ht 64.0 in | Wt 165.0 lb

## 2018-10-19 DIAGNOSIS — R790 Abnormal level of blood mineral: Secondary | ICD-10-CM

## 2018-10-19 DIAGNOSIS — R0600 Dyspnea, unspecified: Secondary | ICD-10-CM

## 2018-10-19 DIAGNOSIS — R55 Syncope and collapse: Secondary | ICD-10-CM | POA: Diagnosis not present

## 2018-10-19 DIAGNOSIS — R5383 Other fatigue: Secondary | ICD-10-CM | POA: Diagnosis not present

## 2018-10-19 DIAGNOSIS — R0609 Other forms of dyspnea: Secondary | ICD-10-CM | POA: Diagnosis not present

## 2018-10-19 DIAGNOSIS — R531 Weakness: Secondary | ICD-10-CM

## 2018-10-19 DIAGNOSIS — G478 Other sleep disorders: Secondary | ICD-10-CM

## 2018-10-19 DIAGNOSIS — R3121 Asymptomatic microscopic hematuria: Secondary | ICD-10-CM

## 2018-10-19 DIAGNOSIS — R7303 Prediabetes: Secondary | ICD-10-CM | POA: Diagnosis not present

## 2018-10-19 DIAGNOSIS — E538 Deficiency of other specified B group vitamins: Secondary | ICD-10-CM

## 2018-10-19 DIAGNOSIS — R0989 Other specified symptoms and signs involving the circulatory and respiratory systems: Secondary | ICD-10-CM

## 2018-10-19 LAB — BASIC METABOLIC PANEL
BUN/Creatinine Ratio: 13 (ref 9–23)
BUN: 13 mg/dL (ref 6–24)
CO2: 23 mmol/L (ref 20–29)
Calcium: 8.9 mg/dL (ref 8.7–10.2)
Chloride: 102 mmol/L (ref 96–106)
Creatinine, Ser: 1.02 mg/dL — ABNORMAL HIGH (ref 0.57–1.00)
GFR calc Af Amer: 78 mL/min/{1.73_m2} (ref >59)
GFR calc non Af Amer: 68 mL/min/{1.73_m2} (ref >59)
Glucose: 91 mg/dL (ref 65–99)
Potassium: 4.2 mmol/L (ref 3.5–5.2)
Sodium: 139 mmol/L (ref 134–144)

## 2018-10-19 LAB — MAGNESIUM: Magnesium: 2.1 mg/dL (ref 1.6–2.3)

## 2018-10-19 LAB — POCT URINALYSIS DIP (MANUAL ENTRY)
Bilirubin, UA: NEGATIVE
Glucose, UA: NEGATIVE mg/dL
Leukocytes, UA: NEGATIVE
Nitrite, UA: NEGATIVE
Protein Ur, POC: NEGATIVE mg/dL
Spec Grav, UA: 1.01 (ref 1.010–1.025)
Urobilinogen, UA: 0.2 E.U./dL
pH, UA: 6 (ref 5.0–8.0)

## 2018-10-19 LAB — POC MICROSCOPIC URINALYSIS (UMFC): Mucus: ABSENT

## 2018-10-19 LAB — TSH: TSH: 0.933 u[IU]/mL (ref 0.450–4.500)

## 2018-10-19 LAB — GLUCOSE, POCT (MANUAL RESULT ENTRY): POC Glucose: 94 mg/dl (ref 70–99)

## 2018-10-19 LAB — HCG, SERUM, QUALITATIVE: hCG,Beta Subunit,Qual,Serum: NEGATIVE m[IU]/mL (ref <6)

## 2018-10-19 LAB — POCT GLYCOSYLATED HEMOGLOBIN (HGB A1C): Hemoglobin A1C: 5.7 % — AB (ref 4.0–5.6)

## 2018-10-19 MED ORDER — LEVALBUTEROL HCL 0.63 MG/3ML IN NEBU
0.6300 mg | INHALATION_SOLUTION | Freq: Once | RESPIRATORY_TRACT | Status: AC
  Administered 2018-10-19: 0.63 mg via RESPIRATORY_TRACT

## 2018-10-19 MED ORDER — IPRATROPIUM BROMIDE 0.02 % IN SOLN
0.5000 mg | Freq: Once | RESPIRATORY_TRACT | Status: AC
  Administered 2018-10-19: 0.5 mg via RESPIRATORY_TRACT

## 2018-10-19 MED ORDER — METOCLOPRAMIDE HCL 5 MG PO TABS: 5.0000 mg | ORAL_TABLET | Freq: Four times a day (QID) | ORAL | 0 refills | Status: DC

## 2018-10-19 MED ORDER — FAMOTIDINE 40 MG PO TABS: 20.0000 mg | ORAL_TABLET | Freq: Two times a day (BID) | ORAL | 1 refills | Status: DC

## 2018-10-19 MED ORDER — CYANOCOBALAMIN 1000 MCG/ML IJ SOLN
1000.0000 ug | INTRAMUSCULAR | Status: DC
  Administered 2018-10-19: 1000 ug via INTRAMUSCULAR

## 2018-10-19 NOTE — Telephone Encounter (Signed)
LM2CB- PT HAS APPT SCHEDULED WITH DR HOCHREIN 12-20 SHE ALSO HAS A 6 WEEK F/U SCHEDULED ON THE 31st. THE APPT ON THE 20th IS NOT NEEDED INQUIRE WITH PT TO MAKE SURE SHE DOES NOT NEED APPT.

## 2018-10-19 NOTE — Patient Instructions (Signed)
° ° ° °  If you have lab work done today you will be contacted with your lab results within the next 2 weeks.  If you have not heard from us then please contact us. The fastest way to get your results is to register for My Chart. ° ° °IF you received an x-ray today, you will receive an invoice from [facility]. Please contact [facility] at [phone] with questions or concerns regarding your invoice.  ° °IF you received labwork today, you will receive an invoice from [facility]. Please contact [facility] at [phone] with questions or concerns regarding your invoice.  ° °Our billing staff will not be able to assist you with questions regarding bills from these companies. ° °You will be contacted with the lab results as soon as they are available. The fastest way to get your results is to activate your My Chart account. Instructions are located on the last page of this paperwork. If you have not heard from us regarding the results in 2 weeks, please contact this office. °  ° ° ° °

## 2018-10-19 NOTE — Progress Notes (Signed)
Subjective:    Patient: Brooke Turner  DOB: [birth year]; 42 y.o.   MRN: [id number]  Chief Complaint  Patient presents with  . Fatigue    has no energy/ tired  x 3 weeks  . Constipation    has not had a BM since Monday with nausea   . Loss of Consciousness    follow-up     HPI  Has been taking vitamins regulalry - did switch to women's one-a-day with more biotin than the prenatal.  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  But she cold-turkey stopped just 3.5-4 wks ago as she was so lethargic she could barely go to work.  When she wakes up in morning it feels like she didn't even sleep.  The last time she went to the gym after hard core cardio she felt a little lightheaded in the sauna, had no energy - had to struggle so much to do any exercise.  More winded  Last week she had been standing for about 10 min talking to her boss - she was leaning against a counter and had a heavy purse on her shoulder - then suddenly got tunnel vision and hearing started to go - they told her she looked grey and made her sit down - afterwhich she got panicky and bp went extremely high - so then a panic attack set in - EMS said BP was 230s.  Has not had recurred but since has had episode where things got foggy - very instant lightheadedness - the other day she was in the store and she just dropped everything when it started to happen and she started leaving after a few seconds. Happened last Sat when just sitting at the desk at work - -felt like something was dropping or spiking.  Has been int nausea for past 6 wks but on Mon had abd cramping/twisting and more sig nausea all day - CT nml - they gave her IV nausea med which helped sev hrs later.  Everytime she tries to eat she feels like she going to throw up or gets more cramping and nausea so has not had BM since Mon - so not eating hardly anything but drinking lots of water -  urine is just going straight through her - just making tons of urine.   Seen at [facility] - Has appt 18  Ferritin, ebv? Try b12 shot   Medical History Past Medical History:  Diagnosis Date  . Anxiety   . Back pain   . Chest pain    a. 09/2014 Echo: EF 60-65%, no rwma, nl valves;  b. 12/2014 Neg ETT.  . Diabetes mellitus without complication ([place])   . Essential hypertension    a. 01/2016 Renal duplex: no RAS- ? renal cyst (renal u/s 4/17 - no cyst, nl study); b. 02/2016 24 hr BP Monitor: Mean SBP ~ 138 with Max of 198. Highest pressures noted in evening hours following placement of cuff.  . Fibroid uterus   . GERD (gastroesophageal reflux disease)   . Palpitations    a. 09/2014 nl event monitor.   Past Surgical History:  Procedure Laterality Date  . WISDOM TOOTH EXTRACTION     Current Outpatient Medications on File Prior to Visit  Medication Sig Dispense Refill  . ALPRAZolam (NIRAVAM) 0.25 MG dissolvable tablet Take 1 tablet (0.25 mg total) by mouth at bedtime as needed  for anxiety. 20 tablet 1  . blood glucose meter kit and supplies Test blood sugar twice daily. Dx code: R73.09 1 each 0  . carvedilol (COREG) 3.125 MG tablet Take 1 tablet (3.125 mg total) by mouth 2 (two) times daily with a meal. 180 tablet 1  . famotidine (PEPCID) 20 MG tablet Take 20 mg by mouth 2 (two) times daily.    . fluticasone (FLONASE) 50 MCG/ACT nasal spray Place 2 sprays into both nostrils daily. (Patient taking differently: Place 2 sprays into both nostrils daily as needed (seasonal allergies). ) 16 g 0  . LORazepam (ATIVAN) 0.5 MG tablet Take 1 tablet (0.5 mg total) by mouth every 6 (six) hours as needed for anxiety. (Patient taking differently: Take 0.25 mg by mouth every 6 (six) hours as needed for anxiety. ) 30 tablet 2  . Multiple Vitamins-Minerals (MULTIVITAMIN GUMMIES ADULT PO) Take 2 tablets by mouth daily.     . Norethindrone-Ethinyl Estradiol-Fe Biphas (LO LOESTRIN FE) 1 MG-10 MCG / 10  MCG tablet Take 1 tablet by mouth at bedtime.     [name] DELICA LANCETS 01U MISC USE TO TEST BLOOD SUGAR DAILY 100 each 0  . ONETOUCH VERIO test strip TEST BLOOD SUGAR DAILY 25 each 10  . triamcinolone cream (KENALOG) 0.1 % Apply 1 application topically 2 (two) times daily. (Patient taking differently: Apply 1 application topically 2 (two) times daily as needed (rash/itching). ) 30 g 0   No current facility-administered medications on file prior to visit.    Allergies  Allergen Reactions  . Penicillins Hives    Has patient had a PCN reaction causing immediate rash, facial/tongue/throat swelling, SOB or lightheadedness with hypotension:Yes Has patient had a PCN reaction causing severe rash involving mucus membranes or skin necrosis:unsure Has patient had a PCN reaction that required hospitalization: Yes Has patient had a PCN reaction occurring within the last 10 years: No If all of the above answers are "NO", then may proceed with Cephalosporin use.     . Tamiflu [Oseltamivir Phosphate] Nausea And Vomiting    Excessive vomiting  . Tinidazole     Caused severe abd pain/type of colitis   . Diltiazem     Dizzy, felt like she was going to pass out Blood pressure went up per patient    Family History  Problem Relation Age of Onset  . Seizures Mother   . Cancer Father        Liver  . Diabetes Father 94  . Hypertension Father   . Heart disease Father 75       CEA, LE [place]  . Alzheimer's disease Maternal Grandmother   . Heart disease Maternal Grandfather    Social History   Socioeconomic History  . Marital status: Single    Spouse name: N/A  . Number of children: 0  . Years of education: 23  . Highest education level: Not on file  Occupational History  . Occupation: Research scientist (physical sciences): [employer]  Social Needs  . Financial resource strain: Not on file  . Food insecurity:    Worry: Not on file    Inability: Not on file  . Transportation needs:      Medical: Not on file    Non-medical: Not on file  Tobacco Use  . Smoking status: Former Smoker    Packs/day: 0.25    Years: 10.00    Pack years: 2.50    Types: Cigarettes    Last attempt to quit: 08/19/2014  Years since quitting: 4.1  . Smokeless tobacco: Never Used  Substance and Sexual Activity  . Alcohol use: No    Alcohol/week: 0.0 standard drinks  . Drug use: No  . Sexual activity: Not Currently    Partners: Male    Birth control/protection: Pill  Lifestyle  . Physical activity:    Days per week: Not on file    Minutes per session: Not on file  . Stress: Not on file  Relationships  . Social connections:    Talks on phone: Not on file    Gets together: Not on file    Attends religious service: Not on file    Active member of club or organization: Not on file    Attends meetings of clubs or organizations: Not on file    Relationship status: Not on file  Other Topics Concern  . Not on file  Social History Narrative   Patient lives at home alone .   Patient works full time at [employer]   Education college.   Right handed.   Caffeine none   Depression screen [facility] 2/9 10/01/2018 08/06/2018 07/02/2018 01/18/2018 10/10/2017  Decreased Interest 0 0 0 0 0  Down, Depressed, Hopeless 0 0 0 0 0  PHQ - 2 Score 0 0 0 0 0  Some recent data might be hidden    ROS As noted in HPI  Objective:  BP 138/82   Pulse 69   Temp 98 F (36.7 C) (Oral)   Resp 16   Ht 5' 4"  (1.626 m)   Wt 165 lb (74.8 kg)   SpO2 100%   PF 375 L/min Comment: After breathing treatment  BMI 28.32 kg/m  Physical Exam Constitutional:      General: She is not in acute distress.    Appearance: She is well-developed. She is not diaphoretic.  HENT:     Head: Normocephalic and atraumatic.     Right Ear: External ear normal.     Left Ear: External ear normal.  Eyes:     General: No scleral icterus.    Conjunctiva/sclera: Conjunctivae normal.  Neck:     Musculoskeletal: Normal range of motion and  neck supple.     Thyroid: No thyromegaly.  Cardiovascular:     Rate and Rhythm: Normal rate and regular rhythm.     Heart sounds: Normal heart sounds.  Pulmonary:     Effort: Pulmonary effort is normal. No respiratory distress.     Breath sounds: Normal breath sounds.  Lymphadenopathy:     Cervical: No cervical adenopathy.  Skin:    General: Skin is warm and dry.     Findings: No erythema.  Neurological:     Mental Status: She is alert and oriented to person, place, and time.  Psychiatric:        Behavior: Behavior normal.   Recheck BP 2 hrs after initial 138/82  Peak flow 300 L/min Post levalbuterol & ipratropium neb treat peak flow 375 L/min.  Orthostatic VS for the past 24 hrs:  BP- Lying Pulse- Lying BP- Standing at 0 minutes Pulse- Standing at 0 minutes  10/19/18 1713 (!) 171/95 70 (!) 182/126 86   POC TESTING Office Visit on 10/19/2018  Component Date Value Ref Range Status  . Color, UA 10/19/2018 yellow  yellow Final  . Clarity, UA 10/19/2018 clear  clear Final  . Glucose, UA 10/19/2018 negative  negative mg/dL Final  . Bilirubin, UA 10/19/2018 negative  negative Final  . Ketones, POC UA 10/19/2018  trace (5)* negative mg/dL Final  . Spec Grav, UA 10/19/2018 1.010  1.010 - 1.025 Final  . Blood, UA 10/19/2018 small* negative Final  . pH, UA 10/19/2018 6.0  5.0 - 8.0 Final  . Protein Ur, POC 10/19/2018 negative  negative mg/dL Final  . Urobilinogen, UA 10/19/2018 0.2  0.2 or 1.0 E.U./dL Final  . Nitrite, UA 10/19/2018 Negative  Negative Final  . Leukocytes, UA 10/19/2018 Negative  Negative Final  . WBC,UR,HPF,POC 10/19/2018 None  None WBC/hpf Final  . RBC,UR,HPF,POC 10/19/2018 None  None RBC/hpf Final  . Bacteria 10/19/2018 None  None, Too numerous to count Final  . Mucus 10/19/2018 Absent  Absent Final  . Epithelial Cells, UR Per Microscopy 10/19/2018 None  None, Too numerous to count cells/hpf Final  . POC Glucose 10/19/2018 94  70 - 99 mg/dl Final  . Hemoglobin  A1C 10/19/2018 5.7* 4.0 - 5.6 % Final  Office Visit on 10/18/2018  Component Date Value Ref Range Status  . hCG,Beta Subunit,Qual,Serum 10/18/2018 Negative  Negative <6 mIU/mL Final  . Glucose 10/18/2018 91  65 - 99 mg/dL Final  . BUN 10/18/2018 13  6 - 24 mg/dL Final  . Creatinine, Ser 10/18/2018 1.02* 0.57 - 1.00 mg/dL Final  . GFR calc non Af Amer 10/18/2018 68  >59 mL/min/1.73 Final  . GFR calc Af Amer 10/18/2018 78  >59 mL/min/1.73 Final  . BUN/Creatinine Ratio 10/18/2018 13  9 - 23 Final  . Sodium 10/18/2018 139  134 - 144 mmol/L Final  . Potassium 10/18/2018 4.2  3.5 - 5.2 mmol/L Final  . Chloride 10/18/2018 102  96 - 106 mmol/L Final  . CO2 10/18/2018 23  20 - 29 mmol/L Final  . Calcium 10/18/2018 8.9  8.7 - 10.2 mg/dL Final  . Magnesium 10/18/2018 2.1  1.6 - 2.3 mg/dL Final  . TSH 10/18/2018 0.933  0.450 - 4.500 uIU/mL Final     Assessment & Plan:   1. Near syncope   2. Fatigue, unspecified type   3. Weakness   4. Asymptomatic microscopic hematuria   5. Prediabetes   6. Dyspnea on exertion   7. Low ferritin   8. Low vitamin B12 level   9. Non-restorative sleep   10. Abnormally low peak expiratory flow rate    Patient will continue on current chronic medications other than changes noted above, so ok to refill when needed.   See after visit summary for patient specific instructions.  Orders Placed This Encounter  Procedures  . VITAMIN D 25 Hydroxy (Vit-D Deficiency, Fractures)  . Vitamin B12  . CK  . C-reactive protein  . Sedimentation rate  . Iron, TIBC and Ferritin Panel  . Epstein-Barr virus VCA antibody panel  . Ambulatory referral to Sleep Studies    Referral Priority:   Routine    Referral Type:   Consultation    Referral Reason:   Specialty Services Required    Number of Visits Requested:   1  . Ambulatory referral to Pulmonology    Referral Priority:   Routine    Referral Type:   Consultation    Referral Reason:   Specialty Services Required     Requested Specialty:   Pulmonary Disease    Number of Visits Requested:   1  . Orthostatic vital signs  . Care order/instruction:    Scheduling Instructions:     Peak Flow (IF NEB IS ORDERED PLEASE DO BEFORE AND AFTER NEB)  . POCT urinalysis dipstick  . POCT Microscopic Urinalysis (  [facility])  . POCT glucose (manual entry)  . POCT glycosylated hemoglobin (Hb A1C)    Meds ordered this encounter  Medications  . famotidine (PEPCID) 40 MG tablet    Sig: Take 0.5 tablets (20 mg total) by mouth 2 (two) times daily.    Dispense:  180 tablet    Refill:  1  . ipratropium (ATROVENT) nebulizer solution 0.5 mg  . levalbuterol (XOPENEX) nebulizer solution 0.63 mg  . cyanocobalamin ((VITAMIN B-12)) injection 1,000 mcg  . metoCLOPramide (REGLAN) 5 MG tablet    Sig: Take 1 tablet (5 mg total) by mouth 4 (four) times daily.    Dispense:  20 tablet    Refill:  0    Patient verbalized to me that they understand the following: diagnosis, what is being done for them, what to expect and what should be done at home.  Their questions have been answered. They understand that I am unable to predict every possible medication interaction or adverse outcome and that if any unexpected symptoms arise, they should contact us and their pharmacist, as well as never hesitate to seek urgent/emergent care at [facility] or ER if they think it might be warranted.    Over 40 min spent in face-to-face evaluation of and consultation with patient and coordination of care.  Over 50% of this time was spent counseling this patient regarding numerous acute concerns above.  [name], MD, MPH [facility] at [facility] [address] [place], [place]  [address] [phone] Office phone  [phone] Office fax  10/19/18 4:47 PM

## 2018-10-20 ENCOUNTER — Ambulatory Visit: Payer: 59 | Admitting: Family Medicine

## 2018-10-20 LAB — EPSTEIN-BARR VIRUS VCA ANTIBODY PANEL
EBV Early Antigen Ab, IgG: 84.7 U/mL — ABNORMAL HIGH (ref 0.0–8.9)
EBV NA IgG: >600 U/mL — ABNORMAL HIGH (ref 0.0–17.9)
EBV VCA IgG: >600 U/mL — ABNORMAL HIGH (ref 0.0–17.9)
EBV VCA IgM: <36 U/mL (ref 0.0–35.9)

## 2018-10-20 LAB — IRON,TIBC AND FERRITIN PANEL
Ferritin: 39 ng/mL (ref 15–150)
Iron Saturation: 14 % — ABNORMAL LOW (ref 15–55)
Iron: 59 ug/dL (ref 27–159)
Total Iron Binding Capacity: 424 ug/dL (ref 250–450)
UIBC: 365 ug/dL (ref 131–425)

## 2018-10-20 LAB — SEDIMENTATION RATE: Sed Rate: 5 mm/hr (ref 0–32)

## 2018-10-20 LAB — C-REACTIVE PROTEIN: CRP: 12 mg/L — ABNORMAL HIGH (ref 0–10)

## 2018-10-20 LAB — CK: Total CK: 254 U/L — ABNORMAL HIGH (ref 24–173)

## 2018-10-20 LAB — VITAMIN B12: Vitamin B-12: 343 pg/mL (ref 232–1245)

## 2018-10-20 LAB — VITAMIN D 25 HYDROXY (VIT D DEFICIENCY, FRACTURES): Vit D, 25-Hydroxy: 24.9 ng/mL — ABNORMAL LOW (ref 30.0–100.0)

## 2018-10-21 ENCOUNTER — Other Ambulatory Visit: Payer: Self-pay | Admitting: Family Medicine

## 2018-10-22 ENCOUNTER — Ambulatory Visit (INDEPENDENT_AMBULATORY_CARE_PROVIDER_SITE_OTHER): Payer: 59

## 2018-10-22 DIAGNOSIS — R55 Syncope and collapse: Secondary | ICD-10-CM | POA: Diagnosis not present

## 2018-10-22 DIAGNOSIS — R002 Palpitations: Secondary | ICD-10-CM

## 2018-10-22 NOTE — Telephone Encounter (Signed)
Requested medication (s) are due for refill today: yes  Requested medication (s) are on the active medication list: yes  Last refill:  01/18/18 for 30 tabs and 2 refills  Future visit scheduled: yes  Notes to clinic:  Med can not be delegated, anxiolytics/hypnotics failed.  Requested Prescriptions  Pending Prescriptions Disp Refills   LORazepam (ATIVAN) 0.5 MG tablet [Pharmacy Med Name: LORAZEPAM 0.5MG      TAB] 30 tablet 0    Sig: TAKE 1 TABLET BY MOUTH EVERY 6 HOURS AS NEEDED FOR ANXIETY     Not Delegated - Psychiatry:  Anxiolytics/Hypnotics Failed - 10/21/2018 10:00 AM      Failed - This refill cannot be delegated      Failed - Urine Drug Screen completed in last 360 days.      Passed - Valid encounter within last 6 months    Recent Outpatient Visits          3 days ago Near syncope   [facility], Laurey Arrow, MD   3 weeks ago Anxiety about health   Primary [facility] at [facility], Gelene Mink, PA-C   2 months ago Rash and nonspecific skin eruption   Primary Care at [facility], Fenton Malling, MD   3 months ago Essential hypertension   [facility], Laurey Arrow, MD   9 months ago Palpitations   [facility], Laurey Arrow, MD      Future Appointments            In 1 month Brigitte Pulse Laurey Arrow, MD [facility] at [place], [facility]

## 2018-10-23 ENCOUNTER — Telehealth: Payer: Self-pay | Admitting: Cardiology

## 2018-10-23 ENCOUNTER — Telehealth: Payer: Self-pay | Admitting: Internal Medicine

## 2018-10-23 ENCOUNTER — Telehealth: Payer: Self-pay | Admitting: Family Medicine

## 2018-10-23 NOTE — Telephone Encounter (Signed)
ATC pt, no answer. Left message for pt to call back.  Please schedule with CY.

## 2018-10-23 NOTE — Telephone Encounter (Signed)
CALLED PT she states that she needs a note for her monitor since she is flying "away" for christmas. She states that she can pick up the letter before 7am when it is ready.  Letter printed and taken to the front desk for pt to p/u

## 2018-10-23 NOTE — Telephone Encounter (Signed)
Copied from [place] [phone]. Topic: General - Inquiry >> Oct 23, 2018  9:42 AM Margot Ables wrote: Reason for CRM: carvedilol (COREG) 3.125 MG tablet Pt wants call to discuss this medication and effects of coming off of it. She states she's had "critical things" happening and would like to discuss with a nurse. Pt states that her blood pressure has been dropping which is why she came into the office 10/19/18. She states she needs to also discuss her thyroid. Pt then stated she had to go into work and needed to hang up.

## 2018-10-23 NOTE — Telephone Encounter (Signed)
Spoke with pt who states she was calling to see if she could take a shower with her monitor she had placed yesterday. Informed pt a message will be routed to device clinic to contact her. Pt verbalized understanding.

## 2018-10-23 NOTE — Telephone Encounter (Signed)
New Message:   Patient calling because she has a monitor put on yesterday and wanted to know if she can take a shower.

## 2018-10-24 NOTE — Progress Notes (Deleted)
Cardiology Office Note   Date:  10/24/2018   ID:  Brooke Turner, DOB [birth year], MRN [id number]  PCP:  [name], MD  Cardiologist:   Minus Breeding, MD Referring:  ***  No chief complaint on file.     History of Present Illness: Brooke Turner is a 42 y.o. female who presents for ***     Past Medical History:  Diagnosis Date  . Anxiety   . Back pain   . Chest pain    a. 09/2014 Echo: EF 60-65%, no rwma, nl valves;  b. 12/2014 Neg ETT.  . Diabetes mellitus without complication ([place])   . Essential hypertension    a. 01/2016 Renal duplex: no RAS- ? renal cyst (renal u/s 4/17 - no cyst, nl study); b. 02/2016 24 hr BP Monitor: Mean SBP ~ 138 with Max of 198. Highest pressures noted in evening hours following placement of cuff.  . Fibroid uterus   . GERD (gastroesophageal reflux disease)   . Palpitations    a. 09/2014 nl event monitor.    Past Surgical History:  Procedure Laterality Date  . WISDOM TOOTH EXTRACTION       Current Outpatient Medications  Medication Sig Dispense Refill  . ALPRAZolam (NIRAVAM) 0.25 MG dissolvable tablet Take 1 tablet (0.25 mg total) by mouth at bedtime as needed for anxiety. 20 tablet 1  . blood glucose meter kit and supplies Test blood sugar twice daily. Dx code: R73.09 1 each 0  . carvedilol (COREG) 3.125 MG tablet Take 1 tablet (3.125 mg total) by mouth 2 (two) times daily with a meal. 180 tablet 1  . famotidine (PEPCID) 40 MG tablet Take 0.5 tablets (20 mg total) by mouth 2 (two) times daily. 180 tablet 1  . fluticasone (FLONASE) 50 MCG/ACT nasal spray Place 2 sprays into both nostrils daily. (Patient taking differently: Place 2 sprays into both nostrils daily as needed (seasonal allergies). ) 16 g 0  . LORazepam (ATIVAN) 0.5 MG tablet Take 1 tablet (0.5 mg total) by mouth every 6 (six) hours as needed for anxiety. (Patient taking differently: Take 0.25 mg by mouth every 6 (six) hours as needed for anxiety. ) 30 tablet 2   . metoCLOPramide (REGLAN) 5 MG tablet Take 1 tablet (5 mg total) by mouth 4 (four) times daily. 20 tablet 0  . Multiple Vitamins-Minerals (MULTIVITAMIN GUMMIES ADULT PO) Take 2 tablets by mouth daily.     . Norethindrone-Ethinyl Estradiol-Fe Biphas (LO LOESTRIN FE) 1 MG-10 MCG / 10 MCG tablet Take 1 tablet by mouth at bedtime.     [name] DELICA LANCETS 22L MISC USE TO TEST BLOOD SUGAR DAILY 100 each 0  . ONETOUCH VERIO test strip TEST BLOOD SUGAR DAILY 25 each 10  . triamcinolone cream (KENALOG) 0.1 % Apply 1 application topically 2 (two) times daily. (Patient taking differently: Apply 1 application topically 2 (two) times daily as needed (rash/itching). ) 30 g 0   Current Facility-Administered Medications  Medication Dose Route Frequency Provider Last Rate Last Dose  . cyanocobalamin ((VITAMIN B-12)) injection 1,000 mcg  1,000 mcg Intramuscular Q30 days [name], MD   1,000 mcg at 10/19/18 1837    Allergies:   Penicillins; Tamiflu [oseltamivir phosphate]; Tinidazole; and Diltiazem    Social History:  The patient  reports that she quit smoking about 4 years ago. Her smoking use included cigarettes. She has a 2.50 pack-year smoking history. She has never used smokeless tobacco. She reports that she does  not drink alcohol or use drugs.   Family History:  The patient's ***family history includes Alzheimer's disease in her maternal grandmother; Cancer in her father; Diabetes (age of onset: 86) in her father; Heart disease in her maternal grandfather; Heart disease (age of onset: 76) in her father; Hypertension in her father; Seizures in her mother.    ROS:  Please see the history of present illness.   Otherwise, review of systems are positive for {NONE DEFAULTED:18576::"none"}.   All other systems are reviewed and negative.    PHYSICAL EXAM: VS:  There were no vitals taken for this visit. , BMI There is no height or weight on file to calculate BMI. GENERAL:  Well appearing HEENT:   Pupils equal round and reactive, fundi not visualized, oral mucosa unremarkable NECK:  No jugular venous distention, waveform within normal limits, carotid upstroke brisk and symmetric, no bruits, no thyromegaly LYMPHATICS:  No cervical, inguinal adenopathy LUNGS:  Clear to auscultation bilaterally BACK:  No CVA tenderness CHEST:  Unremarkable HEART:  PMI not displaced or sustained,S1 and S2 within normal limits, no S3, no S4, no clicks, no rubs, *** murmurs ABD:  Flat, positive bowel sounds normal in frequency in pitch, no bruits, no rebound, no guarding, no midline pulsatile mass, no hepatomegaly, no splenomegaly EXT:  2 plus pulses throughout, no edema, no cyanosis no clubbing SKIN:  No rashes no nodules NEURO:  Cranial nerves II through XII grossly intact, motor grossly intact throughout PSYCH:  Cognitively intact, oriented to person place and time    EKG:  EKG {ACTION; IS/IS PZW:[id number]} ordered today. The ekg ordered today demonstrates ***   Recent Labs: 10/08/2018: B Natriuretic Peptide 36.0 10/15/2018: ALT 16; Hemoglobin 14.4; Platelets 226 10/18/2018: BUN 13; Creatinine, Ser 1.02; Magnesium 2.1; Potassium 4.2; Sodium 139; TSH 0.933    Lipid Panel    Component Value Date/Time   CHOL 172 10/25/2016 1850   TRIG 87 10/25/2016 1850   HDL 49 10/25/2016 1850   CHOLHDL 3.5 10/25/2016 1850   LDLCALC 106 (H) 10/25/2016 1850   LDLDIRECT 101 (H) 10/25/2016 1850      Wt Readings from Last 3 Encounters:  10/19/18 165 lb (74.8 kg)  10/18/18 168 lb (76.2 kg)  10/15/18 165 lb (74.8 kg)      Other studies Reviewed: Additional studies/ records that were reviewed today include: ***. Review of the above records demonstrates:  Please see elsewhere in the note.  ***   ASSESSMENT AND PLAN:  ***   Current medicines are reviewed at length with the patient today.  The patient {ACTIONS; HAS/DOES NOT HAVE:19233} concerns regarding medicines.  The following changes have been made:   {PLAN; NO CHANGE:13088:s}  Labs/ tests ordered today include: *** No orders of the defined types were placed in this encounter.    Disposition:   FU with ***    Signed, Minus Breeding, MD  10/24/2018 8:10 PM    [facility]

## 2018-10-24 NOTE — Telephone Encounter (Signed)
Patient contacted with instructions on showering with monitor.  Patient also brought up needing a note to fly with the monitor on.  Advised patient it is best to remove and shut off the monitor for a flight.  She can turn the phone back on, reapply the monitor and turn in on when she reaches her destination.

## 2018-10-24 NOTE — Telephone Encounter (Signed)
Pt has an appt with CY tomorrow, 12/19. Nothing further needed.

## 2018-10-25 ENCOUNTER — Encounter: Payer: Self-pay | Admitting: Internal Medicine

## 2018-10-25 ENCOUNTER — Ambulatory Visit: Payer: 59 | Admitting: Internal Medicine

## 2018-10-25 VITALS — BP 138/82 | HR 80 | Ht 64.0 in | Wt 165.0 lb

## 2018-10-25 DIAGNOSIS — R0683 Snoring: Secondary | ICD-10-CM

## 2018-10-25 DIAGNOSIS — R0600 Dyspnea, unspecified: Secondary | ICD-10-CM

## 2018-10-25 DIAGNOSIS — R0609 Other forms of dyspnea: Secondary | ICD-10-CM | POA: Diagnosis not present

## 2018-10-25 DIAGNOSIS — R0602 Shortness of breath: Secondary | ICD-10-CM | POA: Diagnosis not present

## 2018-10-25 DIAGNOSIS — G4733 Obstructive sleep apnea (adult) (pediatric): Secondary | ICD-10-CM | POA: Diagnosis not present

## 2018-10-25 MED ORDER — TIOTROPIUM BROMIDE MONOHYDRATE 1.25 MCG/ACT IN AERS: 2.0000 | INHALATION_SPRAY | Freq: Every day | RESPIRATORY_TRACT | 0 refills | Status: DC

## 2018-10-25 NOTE — Patient Instructions (Signed)
Order- schedule unattended home sleep test    Dx OSA  Order- schedule full PFT    Dx dyxpnea  Sample Incruse Elipta inhaler     Inhale 1 puff, once daily

## 2018-10-25 NOTE — Telephone Encounter (Signed)
Please advise 

## 2018-10-25 NOTE — Progress Notes (Signed)
HPI 42 year old female former smoker followed for sleep concerns/insomnia NPSG 11/18/14 [facility]/ [facility]- AHI 0.2 per hour with some snoring, mild periodic limb movement NPSG 01/19/16- Normal with snoring, AHI 0.2/ hr, desat to 93%, body weight 166 lbs --------------------------------------------------------------------------------------- 04/25/2016-42 year old female former smoker followed for sleep concerns/insomnia FOLLOWS FOR: review sleep study with patient. NPSG 01/19/16- Normal with snoring, AHI 0.2/ hr, desat to 93%, body weight 166 lbs We reviewed her sleep study. I explained normal range findings. She describes anxiety if she wakes with sense of heart racing or shortness of breath but no such events were recorded during the sleep study. She is being evaluated by cardiology for the heart rhythm. She is sleeping alone with no witnessed events.  10/25/2018- 42 year old female former smoker followed for sleep concerns/insomnia, complicated by DM 2, HBP, chronic rhinitis/sinusitis, headaches, -----She has had more issues with waking up in the night feeling like her heart is beating out of her chest.She has had increase SOB while talking and with activies. Alprazolam 0.25 mg at at bedtime, lorazepam 0.5 mg every 6 hours if needed for anxiety, Echocardiogram stress test 08/24/2017-negative Methacholine Challenge Test--04/01/15-negative for hyper reactive airways She says she has had several nights when she has awakened with heart pounding, which frightens her.  Feels she gets short of breath more easily recently at the gym with no acute event.  Occasionally hears a little wheeze if she is talking a lot on the phone. Had a fainting episode at work and is now wearing a heart monitor Lorazepam is sufficient for helping her sleep at night.  She does not feel bothered by insomnia.  Concerned that she may have sleep apnea although 2017 study was negative. CXR 10/15/2018- No active cardiopulmonary  disease. ?trazodone  ROS-see HPI   + = positive Constitutional:     weight loss, night sweats, fevers, chills, fatigue, lassitude. HEENT:    headaches, difficulty swallowing, tooth/dental problems, sore throat,       sneezing, itching, ear ache, nasal congestion, post nasal drip, snoring CV:    chest pain, orthopnea, PND, swelling in lower extremities, anasarca,                                                          dizziness, + palpitations Resp:   +shortness of breath with exertion or at rest.                productive cough,   non-productive cough, coughing up of blood.              change in color of mucus.  wheezing.   Skin:    rash or lesions. GI:  No-   heartburn, indigestion, abdominal pain, nausea, vomiting, diarrhea,                 change in bowel habits, loss of appetite GU: dysuria, change in color of urine, no urgency or frequency.   flank pain. MS:   joint pain, stiffness, decreased range of motion, back pain. Neuro-     nothing unusual Psych:  change in mood or affect.  depression, + anxiety.   memory loss.  OBJ- Physical Exam General- Alert, Oriented, Affect-appropriate, Distress- none acute, + talkative Skin- rash-none, lesions- none, excoriation- none Lymphadenopathy- none Head- atraumatic  Eyes- Gross vision intact, PERRLA, conjunctivae and secretions clear            Ears- Hearing, canals-normal            Nose- Clear, no-Septal dev, mucus, polyps, erosion, perforation             Throat- Mallampati II-III , mucosa clear , drainage- none, tonsils- atrophic Neck- flexible , trachea midline, no stridor , thyroid nl, carotid no bruit Chest - symmetrical excursion , unlabored           Heart/CV- RRR , no murmur , no gallop  , no rub, nl s1 s2                           - JVD- none , edema- none, stasis changes- none, varices- none           Lung- clear to P&A, wheeze- none, cough- none , dullness-none, rub- none           Chest wall-  Abd-  Br/ Gen/  Rectal- Not done, not indicated Extrem- cyanosis- none, clubbing, none, atrophy- none, strength- nl Neuro- grossly intact to observation

## 2018-10-26 ENCOUNTER — Emergency Department (HOSPITAL_COMMUNITY): Payer: 59

## 2018-10-26 ENCOUNTER — Telehealth: Payer: Self-pay | Admitting: Family Medicine

## 2018-10-26 ENCOUNTER — Emergency Department (HOSPITAL_COMMUNITY)
Admission: EM | Admit: 2018-10-26 | Discharge: 2018-10-27 | Disposition: A | Discharge status: Home / Self Care | Payer: 59 | Attending: Emergency Medicine | Admitting: Emergency Medicine

## 2018-10-26 ENCOUNTER — Encounter (HOSPITAL_COMMUNITY): Payer: Self-pay

## 2018-10-26 ENCOUNTER — Ambulatory Visit: Payer: 59 | Admitting: Cardiology

## 2018-10-26 ENCOUNTER — Other Ambulatory Visit: Payer: Self-pay

## 2018-10-26 DIAGNOSIS — I10 Essential (primary) hypertension: Secondary | ICD-10-CM | POA: Insufficient documentation

## 2018-10-26 DIAGNOSIS — R0602 Shortness of breath: Secondary | ICD-10-CM | POA: Insufficient documentation

## 2018-10-26 DIAGNOSIS — Z87891 Personal history of nicotine dependence: Secondary | ICD-10-CM | POA: Insufficient documentation

## 2018-10-26 DIAGNOSIS — E119 Type 2 diabetes mellitus without complications: Secondary | ICD-10-CM | POA: Diagnosis not present

## 2018-10-26 DIAGNOSIS — R000 Tachycardia, unspecified: Secondary | ICD-10-CM | POA: Diagnosis not present

## 2018-10-26 DIAGNOSIS — Z79899 Other long term (current) drug therapy: Secondary | ICD-10-CM | POA: Diagnosis not present

## 2018-10-26 DIAGNOSIS — R079 Chest pain, unspecified: Secondary | ICD-10-CM | POA: Diagnosis not present

## 2018-10-26 NOTE — Telephone Encounter (Signed)
Copied from [place] [id number]. Topic: Quick Communication - See Telephone Encounter >> Oct 26, 2018  1:15 PM Blase Mess A wrote: CRM for notification. See Telephone encounter for: 10/26/18.  Patient is calling to go over lab results please advise.

## 2018-10-26 NOTE — ED Triage Notes (Signed)
Pt reports that she has felt progressively more winded throughout the day. She also reports developing some chest pain and tightness on the L side before coming in. She is wearing a holter monitor for evaluation of syncope with a cardiologist. She reports that she feel like she can't get a deep breath. Pt is hypertensive and is on medication for it, she reports that she thinks it is up d/t anxiety because it was 128/77 in her car earlier. A&Ox4. Ambulatory. Pt is on birth control.

## 2018-10-26 NOTE — ED Notes (Addendum)
Patient transported to X-ray. Ambulatory with steady gait.

## 2018-10-27 DIAGNOSIS — M5136 Other intervertebral disc degeneration, lumbar region: Secondary | ICD-10-CM | POA: Diagnosis not present

## 2018-10-27 NOTE — ED Notes (Addendum)
Pt. Ambulated down the hall and back to her room on 100% room air without difficulty and heart rate of 70. Pt. Gait steady on her feet.

## 2018-10-27 NOTE — ED Provider Notes (Signed)
[facility] DEPT Provider Note   CSN: [id number] Arrival date & time: 10/26/18  2308     History   Chief Complaint Chief Complaint  Patient presents with  . Shortness of Breath  . Chest Pain    HPI Brooke Turner is a 42 y.o. female.  The history is provided by the patient.  Shortness of Breath  This is a new problem. The problem occurs frequently.The problem has not changed since onset.Associated symptoms include chest pain. Pertinent negatives include no fever, no cough, no hemoptysis, no syncope and no vomiting. Treatments tried: Lorazepam. The treatment provided no relief.  Chest Pain   Associated symptoms include shortness of breath. Pertinent negatives include no cough, no fever, no hemoptysis, no syncope and no vomiting.   Patient history of anxiety, back pain, hypertension presents with recurrent chest pain and shortness of breath. Patient has had evaluations for chest pain shortness of breath.  She has had a recent negative D-dimers, she has had a recent negative CT chest.  She is seeing cardiology as an outpatient.  She has seen pulmonology as an outpatient. She is currently wearing a Holter monitor. Tonight she reports she had difficulty getting a deep breath, also has some chest burning.  No significant palpitations tonight.  No syncope.  No new medicines.   Past Medical History:  Diagnosis Date  . Anxiety   . Back pain   . Chest pain    a. 09/2014 Echo: EF 60-65%, no rwma, nl valves;  b. 12/2014 Neg ETT.  . Diabetes mellitus without complication ([place])   . Essential hypertension    a. 01/2016 Renal duplex: no RAS- ? renal cyst (renal u/s 4/17 - no cyst, nl study); b. 02/2016 24 hr BP Monitor: Mean SBP ~ 138 with Max of 198. Highest pressures noted in evening hours following placement of cuff.  . Fibroid uterus   . GERD (gastroesophageal reflux disease)   . Palpitations    a. 09/2014 nl event monitor.    Patient Active  Problem List   Diagnosis Date Noted  . Left facial pain 09/20/2016  . Head trauma 09/20/2016  . Palpitations   . Chest pain   . Left sided numbness 06/11/2015  . Chronic tension-type headache, not intractable 06/10/2015  . Dyspnea 03/18/2015  . Post-traumatic stress reaction 02/25/2015  . Anxiety disorder due to general medical condition with panic attack 02/25/2015  . Subserous leiomyoma of uterus 02/25/2015  . Anxiety about health 02/25/2015  . Deviated nasal septum 12/30/2014  . Chronic rhinitis 12/30/2014  . Chronic maxillary sinusitis 12/30/2014  . Snorings 09/30/2014  . Fatigue 09/26/2014  . Dizziness and giddiness 09/11/2014  . Tachycardia 09/11/2014  . Precordial chest pain 09/11/2014  . Prediabetes 08/26/2014  . Acanthosis nigricans 03/19/2014  . Vitamin D deficiency 07/15/2013  . Large breasts 09/12/2012  . Essential hypertension 02/17/2012  . Contraception management 02/17/2012  . Obesity (BMI 30-39.9) previous BMI was over 40. Patient has lost 70 pounds in the last year 02/17/2012  . Anxiety disorder 02/17/2012  . Chronic back pain 02/17/2012    Past Surgical History:  Procedure Laterality Date  . WISDOM TOOTH EXTRACTION       OB History    Gravida  2   Para  0   Term  0   Preterm  0   AB  2   Living  0     SAB  1   TAB  1   Ectopic  0  Multiple  0   Live Births               Home Medications    Prior to Admission medications   Medication Sig Start Date End Date Taking? Authorizing Provider  carvedilol (COREG) 3.125 MG tablet Take 1 tablet (3.125 mg total) by mouth 2 (two) times daily with a meal. 07/02/18  Yes [name], MD  famotidine (PEPCID) 40 MG tablet Take 0.5 tablets (20 mg total) by mouth 2 (two) times daily. 10/19/18  Yes [name], MD  fluticasone [facility]) 50 MCG/ACT nasal spray Place 2 sprays into both nostrils daily. Patient taking differently: Place 2 sprays into both nostrils daily as needed (seasonal  allergies).  10/10/17  Yes Timmothy Euler, [place] D, PA-C  LORazepam (ATIVAN) 0.5 MG tablet TAKE 1 TABLET BY MOUTH EVERY 6 HOURS AS NEEDED FOR ANXIETY Patient taking differently: Take 0.25 mg by mouth every 6 (six) hours as needed for anxiety.  10/25/18  Yes [name], MD  Multiple Vitamins-Minerals (MULTIVITAMIN GUMMIES ADULT PO) Take 2 tablets by mouth daily.    Yes [provider]  Norethindrone-Ethinyl Estradiol-Fe Biphas (LO LOESTRIN FE) 1 MG-10 MCG / 10 MCG tablet Take 1 tablet by mouth at bedtime.    Yes [provider]  OVER THE COUNTER MEDICATION Take 2 tablets by mouth daily. Vitamin B-12 gummy supplement. 1050mg each gummy.   Yes [provider]  triamcinolone cream (KENALOG) 0.1 % Apply 1 application topically 2 (two) times daily. Patient taking differently: Apply 1 application topically 2 (two) times daily as needed (rash/itching).  08/06/18  Yes HPosey Boyer MD  ALPRAZolam (NIRAVAM) 0.25 MG dissolvable tablet Take 1 tablet (0.25 mg total) by mouth at bedtime as needed for anxiety. 10/01/18   McVey, EGelene Mink PA-C  blood glucose meter kit and supplies Test blood sugar twice daily. Dx code: R73.09 07/22/15   SShawnee Knapp MD  metoCLOPramide (REGLAN) 5 MG tablet Take 1 tablet (5 mg total) by mouth 4 (four) times daily. 10/19/18   SShawnee Knapp MD  OAlliancehealth MidwestDELICA LANCETS 309XMISC USE TO TEST BLOOD SUGAR DAILY 10/17/16   SShawnee Knapp MD  ONorman [facility] HealthplexVERIO test strip TEST BLOOD SUGAR DAILY 07/19/16   SShawnee Knapp MD  Tiotropium Bromide Monohydrate (SPIRIVA RESPIMAT) 1.25 MCG/ACT AERS Inhale 2 puffs into the lungs daily. 10/25/18   YDeneise Lever MD    Family History Family History  Problem Relation Age of Onset  . Seizures Mother   . Cancer Father        Liver  . Diabetes Father 632 . Hypertension Father   . Heart disease Father 645      CEA, LE [place]  . Alzheimer's disease Maternal Grandmother   . Heart disease Maternal Grandfather     Social  History Social History   Tobacco Use  . Smoking status: Former Smoker    Packs/day: 0.25    Years: 10.00    Pack years: 2.50    Types: Cigarettes    Start date: 06/25/2000    Last attempt to quit: 08/19/2014    Years since quitting: 4.1  . Smokeless tobacco: Never Used  Substance Use Topics  . Alcohol use: No    Alcohol/week: 0.0 standard drinks  . Drug use: No     Allergies   Penicillins; Tamiflu [oseltamivir phosphate]; Tinidazole; and Diltiazem   Review of Systems Review of Systems  Constitutional: Negative for fever.  Respiratory: Positive  for shortness of breath. Negative for cough and hemoptysis.   Cardiovascular: Positive for chest pain. Negative for syncope.  Gastrointestinal: Negative for vomiting.  Psychiatric/Behavioral: The patient is nervous/anxious.   All other systems reviewed and are negative.    Physical Exam Updated Vital Signs BP (!) 131/91   Pulse 73   Temp 98.3 F (36.8 C) (Oral)   Resp 16   SpO2 100%   Physical Exam CONSTITUTIONAL: Well developed/well nourished HEAD: Normocephalic/atraumatic EYES: EOMI/PERRL ENMT: Mucous membranes moist NECK: supple no meningeal signs SPINE/BACK:entire spine nontender, thoracic paraspinal tenderness CV: S1/S2 noted, no murmurs/rubs/gallops noted LUNGS: Lungs are clear to auscultation bilaterally, no apparent distress ABDOMEN: soft, nontender NEURO: Pt is awake/alert/appropriate, moves all extremitiesx4.  No facial droop.   EXTREMITIES: pulses normal/equal, full ROM SKIN: warm, color normal PSYCH: no abnormalities of mood noted, alert and oriented to situation   ED Treatments / Results  Labs (all labs ordered are listed, but only abnormal results are displayed) Labs Reviewed - No data to display  EKG EKG Interpretation  Date/Time:  Friday October 26 2018 23:16:40 EST Ventricular Rate:  111 PR Interval:    QRS Duration: 104 QT Interval:  330 QTC Calculation: 449 R Axis:   44 Text  Interpretation:  Sinus tachycardia Anteroseptal infarct, age indeterminate Confirmed by Ripley Fraise ([id number]) on 10/26/2018 11:47:52 PM   Radiology Dg Chest 2 View  Result Date: 10/27/2018 CLINICAL DATA:  Chest pain and tightness.  History of hypertension. EXAM: CHEST - 2 VIEW COMPARISON:  Chest radiograph October 15, 2018 FINDINGS: Cardiomediastinal silhouette is normal. No pleural effusions or focal consolidations. Trachea projects midline and there is no pneumothorax. Soft tissue planes and included osseous structures are non-suspicious. Mild thoracic spondylosis. IMPRESSION: Negative. Electronically Signed   By: Elon Alas M.D.   On: 10/27/2018 00:08    Procedures Procedures (including critical care time)  Medications Ordered in ED Medications - No data to display   Initial Impression / Assessment and Plan / ED Course  I have reviewed the triage vital signs and the nursing notes.  Pertinent  imaging results that were available during my care of the patient were reviewed by me and considered in my medical decision making (see chart for details).     2:02 AM Overall patient is well-appearing.  She has had extensive work-up in the past for chest pain and shortness of breath.  At this time I have low suspicion for ACS/PE/dissection.  She ambulated in the ER with a pulse ox of 100% and no tachycardia.  Pt with recent negative CT chest, I feel this makes PE less likely.  Her EKG is unchanged.  Her chest x-ray is negative.  She is undergoing a very thorough evaluation as an outpatient including Holter monitoring, which I encouraged her to continue. I had extensive discussions with patient about her condition, and I encouraged her to continue outpatient work-up. Also discussed limiting her evaluation tonight due to recent evaluations as an outpatient. After discussion, patient is agreeable with plan Final Clinical Impressions(s) / ED Diagnoses   Final diagnoses:  Shortness of  breath    ED Discharge Orders    None       Ripley Fraise, MD 10/27/18 [phone]

## 2018-10-29 ENCOUNTER — Other Ambulatory Visit: Payer: Self-pay

## 2018-10-29 ENCOUNTER — Telehealth: Payer: Self-pay | Admitting: Internal Medicine

## 2018-10-29 ENCOUNTER — Emergency Department (HOSPITAL_COMMUNITY)
Admission: EM | Admit: 2018-10-29 | Discharge: 2018-10-30 | Disposition: A | Discharge status: Home / Self Care | Payer: 59 | Attending: Emergency Medicine | Admitting: Emergency Medicine

## 2018-10-29 ENCOUNTER — Encounter (HOSPITAL_COMMUNITY): Payer: Self-pay | Admitting: *Deleted

## 2018-10-29 DIAGNOSIS — E119 Type 2 diabetes mellitus without complications: Secondary | ICD-10-CM | POA: Insufficient documentation

## 2018-10-29 DIAGNOSIS — Z87891 Personal history of nicotine dependence: Secondary | ICD-10-CM | POA: Diagnosis not present

## 2018-10-29 DIAGNOSIS — Z793 Long term (current) use of hormonal contraceptives: Secondary | ICD-10-CM | POA: Insufficient documentation

## 2018-10-29 DIAGNOSIS — R911 Solitary pulmonary nodule: Secondary | ICD-10-CM | POA: Diagnosis not present

## 2018-10-29 DIAGNOSIS — Z79899 Other long term (current) drug therapy: Secondary | ICD-10-CM | POA: Diagnosis not present

## 2018-10-29 DIAGNOSIS — R0789 Other chest pain: Secondary | ICD-10-CM | POA: Insufficient documentation

## 2018-10-29 DIAGNOSIS — M546 Pain in thoracic spine: Secondary | ICD-10-CM | POA: Insufficient documentation

## 2018-10-29 DIAGNOSIS — R0602 Shortness of breath: Secondary | ICD-10-CM | POA: Diagnosis present

## 2018-10-29 DIAGNOSIS — I10 Essential (primary) hypertension: Secondary | ICD-10-CM | POA: Diagnosis not present

## 2018-10-29 DIAGNOSIS — R079 Chest pain, unspecified: Secondary | ICD-10-CM | POA: Diagnosis not present

## 2018-10-29 NOTE — ED Triage Notes (Signed)
The pt has had episodes with breathing for 2 weeks  She has been seen by her pulmonary doctor and at [facility] long ed  For the same in the past week.  She was given a spiriva inhaler and she gets pain and difficulty brething after she uses the inhaler

## 2018-10-29 NOTE — Telephone Encounter (Signed)
Called and spoke with pt who states she does not seem to see a difference with the spiriva.  Pt stated she has not had any help with her breathing with the spiriva. Pt stated after she took the spiriva today, she became light headed and also stated she has had some chest pain as well.  Pt has concerns wondering if she could have a blood clot based on some of her symptoms. I advised pt to go to the ER due to the fact that she has been having chest pain, light headedness and of her concerns.  Pt expressed understanding. Nothing further needed.

## 2018-10-29 NOTE — ED Triage Notes (Signed)
She is also fying out for the holiday and is farid to fly til she is checked

## 2018-10-30 ENCOUNTER — Telehealth: Payer: Self-pay | Admitting: Internal Medicine

## 2018-10-30 ENCOUNTER — Emergency Department (HOSPITAL_COMMUNITY): Payer: 59

## 2018-10-30 DIAGNOSIS — R0602 Shortness of breath: Secondary | ICD-10-CM | POA: Diagnosis not present

## 2018-10-30 LAB — CBC WITH DIFFERENTIAL/PLATELET
Abs Immature Granulocytes: 0.01 10*3/uL (ref 0.00–0.07)
Basophils Absolute: 0 10*3/uL (ref 0.0–0.1)
Basophils Relative: 0 %
Eosinophils Absolute: 0 10*3/uL (ref 0.0–0.5)
Eosinophils Relative: 1 %
HCT: 39.9 % (ref 36.0–46.0)
Hemoglobin: 13.2 g/dL (ref 12.0–15.0)
Immature Granulocytes: 0 %
Lymphocytes Relative: 59 %
Lymphs Abs: 3.8 10*3/uL (ref 0.7–4.0)
MCH: 29.6 pg (ref 26.0–34.0)
MCHC: 33.1 g/dL (ref 30.0–36.0)
MCV: 89.5 fL (ref 80.0–100.0)
Monocytes Absolute: 0.6 10*3/uL (ref 0.1–1.0)
Monocytes Relative: 9 %
Neutro Abs: 2 10*3/uL (ref 1.7–7.7)
Neutrophils Relative %: 31 %
Platelets: 258 10*3/uL (ref 150–400)
RBC: 4.46 MIL/uL (ref 3.87–5.11)
RDW: 11.9 % (ref 11.5–15.5)
WBC: 6.4 10*3/uL (ref 4.0–10.5)
nRBC: 0 % (ref 0.0–0.2)

## 2018-10-30 LAB — COMPREHENSIVE METABOLIC PANEL
ALT: 17 U/L (ref 0–44)
AST: 18 U/L (ref 15–41)
Albumin: 3.7 g/dL (ref 3.5–5.0)
Alkaline Phosphatase: 36 U/L — ABNORMAL LOW (ref 38–126)
Anion gap: 10 (ref 5–15)
BUN: 7 mg/dL (ref 6–20)
CO2: 24 mmol/L (ref 22–32)
Calcium: 8.6 mg/dL — ABNORMAL LOW (ref 8.9–10.3)
Chloride: 103 mmol/L (ref 98–111)
Creatinine, Ser: 1.16 mg/dL — ABNORMAL HIGH (ref 0.44–1.00)
GFR calc Af Amer: >60 mL/min (ref >60)
GFR calc non Af Amer: 58 mL/min — ABNORMAL LOW (ref >60)
Glucose, Bld: 100 mg/dL — ABNORMAL HIGH (ref 70–99)
Potassium: 3.6 mmol/L (ref 3.5–5.1)
Sodium: 137 mmol/L (ref 135–145)
Total Bilirubin: 1.5 mg/dL — ABNORMAL HIGH (ref 0.3–1.2)
Total Protein: 6.8 g/dL (ref 6.5–8.1)

## 2018-10-30 LAB — I-STAT BETA HCG BLOOD, ED (MC, WL, AP ONLY): I-stat hCG, quantitative: <5 m[IU]/mL (ref <5)

## 2018-10-30 LAB — I-STAT TROPONIN, ED: Troponin i, poc: 0 ng/mL (ref 0.00–0.08)

## 2018-10-30 MED ORDER — IOPAMIDOL (ISOVUE-370) INJECTION 76%
INTRAVENOUS | Status: AC
  Administered 2018-10-30: 100 mL
  Filled 2018-10-30: qty 100

## 2018-10-30 NOTE — ED Provider Notes (Signed)
[facility] EMERGENCY DEPARTMENT Provider Note   CSN: [id number] Arrival date & time: 10/29/18  2046     History   Chief Complaint Chief Complaint  Patient presents with  . Generalized Body Aches    HPI Brooke Turner is a 42 y.o. female with a hx of ADD, back pain, chest pain, diabetes, hypertension, GERD, palpitations presents to the Emergency Department complaining of gradual, persistent, progressively worsening shortness of breath with associated chest pain and upper back pain.  Patient gives a mixed history stating that this has been going on for weeks and getting worse than stating that it is brand-new starting approximately 1 week ago.  Patient reports that exertion makes her shortness of breath significantly worse.  She reports that talking makes her shortness of breath worse.  She states she saw her pulmonologist last week who prescribed Spiriva.  She reports when she uses the Spiriva it causes her chest pain.  Patient denies alleviating doctors.  She denies fever, chills, headache, neck pain, domino pain, nausea, vomiting, diarrhea, weakness, dizziness, syncope.  Patient reports she called her pulmonologist, described her symptoms and she was referred here to the emergency department for a pulmonary embolism work-up.  Patient does take an oral contraceptive but denies current smoking, recent travel, leg swelling, hemoptysis, history of lupus or DVT.  She does not have a known clotting disorder.  Record review shows that patient has been evaluated multiple times in the emergency department for similar or associated symptoms.  She has had multiple chest x-rays and a CT angiogram which was negative for pulmonary embolism.  Patient had an ultrasound of her legs to rule out DVT.  And had one elevated d-dimer after injury to her knee.  Since that time she has had negative D-dimers.    The history is provided by the patient and medical records. No language  interpreter was used.    Past Medical History:  Diagnosis Date  . Anxiety   . Back pain   . Chest pain    a. 09/2014 Echo: EF 60-65%, no rwma, nl valves;  b. 12/2014 Neg ETT.  . Diabetes mellitus without complication ([place])   . Essential hypertension    a. 01/2016 Renal duplex: no RAS- ? renal cyst (renal u/s 4/17 - no cyst, nl study); b. 02/2016 24 hr BP Monitor: Mean SBP ~ 138 with Max of 198. Highest pressures noted in evening hours following placement of cuff.  . Fibroid uterus   . GERD (gastroesophageal reflux disease)   . Palpitations    a. 09/2014 nl event monitor.    Patient Active Problem List   Diagnosis Date Noted  . Left facial pain 09/20/2016  . Head trauma 09/20/2016  . Palpitations   . Chest pain   . Left sided numbness 06/11/2015  . Chronic tension-type headache, not intractable 06/10/2015  . Dyspnea 03/18/2015  . Post-traumatic stress reaction 02/25/2015  . Anxiety disorder due to general medical condition with panic attack 02/25/2015  . Subserous leiomyoma of uterus 02/25/2015  . Anxiety about health 02/25/2015  . Deviated nasal septum 12/30/2014  . Chronic rhinitis 12/30/2014  . Chronic maxillary sinusitis 12/30/2014  . Snorings 09/30/2014  . Fatigue 09/26/2014  . Dizziness and giddiness 09/11/2014  . Tachycardia 09/11/2014  . Precordial chest pain 09/11/2014  . Prediabetes 08/26/2014  . Acanthosis nigricans 03/19/2014  . Vitamin D deficiency 07/15/2013  . Large breasts 09/12/2012  . Essential hypertension 02/17/2012  . Contraception management 02/17/2012  . Obesity (  BMI 30-39.9) previous BMI was over 40. Patient has lost 70 pounds in the last year 02/17/2012  . Anxiety disorder 02/17/2012  . Chronic back pain 02/17/2012    Past Surgical History:  Procedure Laterality Date  . WISDOM TOOTH EXTRACTION       OB History    Gravida  2   Para  0   Term  0   Preterm  0   AB  2   Living  0     SAB  1   TAB  1   Ectopic  0   Multiple    0   Live Births               Home Medications    Prior to Admission medications   Medication Sig Start Date End Date Taking? Authorizing Provider  blood glucose meter kit and supplies Test blood sugar twice daily. Dx code: R73.09 07/22/15  Yes [name], MD  carvedilol (COREG) 3.125 MG tablet Take 1 tablet (3.125 mg total) by mouth 2 (two) times daily with a meal. 07/02/18  Yes [name], MD  famotidine (PEPCID) 40 MG tablet Take 0.5 tablets (20 mg total) by mouth 2 (two) times daily. 10/19/18  Yes [name], MD  fluticasone [facility]) 50 MCG/ACT nasal spray Place 2 sprays into both nostrils daily. Patient taking differently: Place 2 sprays into both nostrils daily as needed (seasonal allergies).  10/10/17  Yes Timmothy Euler, [place] D, PA-C  LORazepam (ATIVAN) 0.5 MG tablet TAKE 1 TABLET BY MOUTH EVERY 6 HOURS AS NEEDED FOR ANXIETY Patient taking differently: Take 0.25 mg by mouth every 6 (six) hours as needed for anxiety.  10/25/18  Yes [name], MD  Multiple Vitamins-Minerals (MULTIVITAMIN GUMMIES ADULT PO) Take 2 tablets by mouth daily.    Yes [provider]  Norethindrone-Ethinyl Estradiol-Fe Biphas (LO LOESTRIN FE) 1 MG-10 MCG / 10 MCG tablet Take 1 tablet by mouth at bedtime.    Yes [provider]  Jonetta Speak LANCETS 63O MISC USE TO TEST BLOOD SUGAR DAILY 10/17/16  Yes [name], MD  [facility] VERIO test strip TEST BLOOD SUGAR DAILY 07/19/16  Yes [name], MD  OVER THE COUNTER MEDICATION Take 2 tablets by mouth daily. Vitamin B-12 gummy supplement. 1050mg each gummy.   Yes [provider]  triamcinolone cream (KENALOG) 0.1 % Apply 1 application topically 2 (two) times daily. Patient taking differently: Apply 1 application topically 2 (two) times daily as needed (rash/itching).  08/06/18  Yes HPosey Boyer MD  ALPRAZolam (NIRAVAM) 0.25 MG dissolvable tablet Take 1 tablet (0.25 mg total) by mouth at bedtime as needed for anxiety. Patient not  taking: Reported on 10/30/2018 10/01/18   McVey, EGelene Mink PA-C  metoCLOPramide (REGLAN) 5 MG tablet Take 1 tablet (5 mg total) by mouth 4 (four) times daily. Patient not taking: Reported on 10/30/2018 10/19/18   SShawnee Knapp MD  Tiotropium Bromide Monohydrate (SPIRIVA RESPIMAT) 1.25 MCG/ACT AERS Inhale 2 puffs into the lungs daily. Patient not taking: Reported on 10/30/2018 10/25/18   YDeneise Lever MD    Family History Family History  Problem Relation Age of Onset  . Seizures Mother   . Cancer Father        Liver  . Diabetes Father 623 . Hypertension Father   . Heart disease Father 622      CEA, LE [place]  . Alzheimer's disease Maternal Grandmother   . Heart  disease Maternal Grandfather     Social History Social History   Tobacco Use  . Smoking status: Former Smoker    Packs/day: 0.25    Years: 10.00    Pack years: 2.50    Types: Cigarettes    Start date: 06/25/2000    Last attempt to quit: 08/19/2014    Years since quitting: 4.2  . Smokeless tobacco: Never Used  Substance Use Topics  . Alcohol use: No    Alcohol/week: 0.0 standard drinks  . Drug use: No     Allergies   Penicillins; Tamiflu [oseltamivir phosphate]; Tinidazole; and Diltiazem   Review of Systems Review of Systems  Constitutional: Negative for appetite change, diaphoresis, fatigue, fever and unexpected weight change.  HENT: Negative for mouth sores.   Eyes: Negative for visual disturbance.  Respiratory: Positive for cough and shortness of breath. Negative for chest tightness and wheezing.   Cardiovascular: Positive for chest pain.  Gastrointestinal: Negative for abdominal pain, constipation, diarrhea, nausea and vomiting.  Endocrine: Negative for polydipsia, polyphagia and polyuria.  Genitourinary: Negative for dysuria, frequency, hematuria and urgency.  Musculoskeletal: Positive for back pain. Negative for neck stiffness.  Skin: Negative for rash.  Allergic/Immunologic: Negative  for immunocompromised state.  Neurological: Negative for syncope, light-headedness and headaches.  Hematological: Does not bruise/bleed easily.  Psychiatric/Behavioral: Negative for sleep disturbance. The patient is not nervous/anxious.      Physical Exam Updated Vital Signs BP (!) 149/71   Pulse 71   Temp 98.5 F (36.9 C) (Oral)   Resp 14   Ht _0  (1.626 m)   Wt 74.4 kg   SpO2 100%   BMI 28.15 kg/m   Physical Exam Vitals signs and nursing note reviewed.  Constitutional:      General: She is not in acute distress.    Appearance: She is well-developed. She is not diaphoretic.     Comments: Awake, alert, nontoxic appearance  HENT:     Head: Normocephalic and atraumatic.     Mouth/Throat:     Pharynx: No oropharyngeal exudate.  Eyes:     General: No scleral icterus.    Conjunctiva/sclera: Conjunctivae normal.  Neck:     Musculoskeletal: Normal range of motion and neck supple.  Cardiovascular:     Rate and Rhythm: Normal rate and regular rhythm.  Pulmonary:     Effort: Pulmonary effort is normal. No respiratory distress.     Breath sounds: Normal breath sounds. No wheezing.  Abdominal:     General: Bowel sounds are normal.     Palpations: Abdomen is soft. There is no mass.     Tenderness: There is no abdominal tenderness. There is no guarding or rebound.  Musculoskeletal: Normal range of motion.     Right lower leg: No edema.     Left lower leg: No edema.     Comments: No peripheral edema or calf tenderness.  No palpable cord.  Skin:    General: Skin is warm and dry.  Neurological:     Mental Status: She is alert.     Comments: Speech is clear and goal oriented Moves extremities without ataxia  Psychiatric:        Mood and Affect: Mood is anxious.        Speech: Speech is rapid and pressured.     Comments: She is clearly very anxious, speaking in long and rapid sentences.      ED Treatments / Results  Labs (all labs ordered are listed, but only abnormal  results are displayed) Labs Reviewed  CBC WITH DIFFERENTIAL/PLATELET  COMPREHENSIVE METABOLIC PANEL  I-STAT TROPONIN, ED  I-STAT BETA HCG BLOOD, ED ([facility], [facility], [facility] ONLY)    EKG EKG Interpretation  Date/Time:  Monday October 29 2018 20:58:01 EST Ventricular Rate:  119 PR Interval:  150 QRS Duration: 84 QT Interval:  322 QTC Calculation: 452 R Axis:   64 Text Interpretation:  Sinus tachycardia Right atrial enlargement Borderline ECG Interpretation limited secondary to artifact Confirmed by Ripley Fraise [phone]) on 10/30/2018 5:39:49 AM   Procedures Procedures (including critical care time)  Medications Ordered in ED Medications - No data to display   Initial Impression / Assessment and Plan / ED Course  I have reviewed the triage vital signs and the nursing notes.  Pertinent labs & imaging results that were available during my care of the patient were reviewed by me and considered in my medical decision making (see chart for details).     Patient presents to the emergency department with ongoing chest pain, back pain and shortness of breath.  She has been seen by her primary care provider, multiple visits to the emergency department and her pulmonologist.  She has had acute coronary syndrome work-ups and pulmonary embolism work-ups that have been negative.  On arrival today patient has a heart rate of 106 and she is hypertensive.  On my examination she is no longer tachycardic.  She has no increased work of breathing.  She has no hypoxia.  She is afebrile.  Discussion with patient about low likelihood of pulmonary embolism given presentation, length of symptoms and previous work-ups.  Patient reports that she is scheduled to fly today and she is afraid to do so as she might have a pulmonary embolism.  I have discussed the risk of multiple CT scans including increased risk of cancer.  Pt is adamant that a CT scan is what she needs per her pulmonologist.  Record review shows that  patient spoke to the office CMA but there is no indication that there was a discussion with the physician.  Additional discussion with patient reviewing all previous work-ups, normal labs and findings.  We discussed the low likelihood of pulmonary embolism, ACS.  Patient requesting CT scan stating she understands the risk.    At shift change care was transferred to Margarita Mail, PA-C who will follow pending studies, re-evaulate and determine disposition.  The patient was discussed with Dr. Christy Gentles who agrees with the treatment plan.   Final Clinical Impressions(s) / ED Diagnoses   Final diagnoses:  Shortness of breath    ED Discharge Orders    None       Loni Muse Gwenlyn Perking 10/30/18 Renella Cunas, MD 10/30/18 [phone]

## 2018-10-30 NOTE — Telephone Encounter (Signed)
Spoke with patient. She stated that she went to the ER last night and was advised to follow up with CY on her inhaler. She is currently using Spiriva 1.25mcg, 2 puffs twice daily. She started having chest pains and an increased heart rate. She went to the ER and was cleared.   CY, please advise about the Spiriva. Thanks!

## 2018-10-30 NOTE — Discharge Instructions (Addendum)
1) your work up was negative for any emergent cause of your symptoms. 2) There was an incidental finding of a small pulmonary nodule on your CT scan. (Please read the attached information about pulmonary nodules).  This is equivalent to finding a mole on one skin.  For instance, someone who has fair skin and has a strong history of melanoma may need further monitoring.  In your instance because of your history of smoking you will just need a repeat CT scan in 6 months.  Please follow-up with your pulmonologist or your primary care doctor.

## 2018-10-30 NOTE — ED Notes (Signed)
Pt verbalized understanding of d/c instructions and has no further questions, VSS, NAD. Pt informed that she needs to have repeat CT scan in 6 months to have pulmonary nodule in right upper lobe monitored.

## 2018-10-30 NOTE — ED Provider Notes (Signed)
6:26 AM BP (!) 149/71   Pulse 71   Temp 98.5 F (36.9 C) (Oral)   Resp 14   Ht 5\' 4"  (1.626 m)   Wt 74.4 kg   SpO2 100%   BMI 28.15 kg/m  Assumed care of patient form PA Muthersbaugh. Patient with multiple recent visits. Patient with severe anxiety. Demands CTA PE chest. Refuses D-dimer. Recent negative work up.  Patient CT NGO negative for acute abnormality.  Informed of an incidental lung nodule and given follow-up instructions.  Given her history of smoking she will likely need a repeat CT of the chest in 6 months.  She appears otherwise appropriate for discharge at this time.   Margarita Mail, PA-C 10/30/18 [phone]    Ripley Fraise, MD 10/30/18 [phone]

## 2018-11-01 NOTE — Telephone Encounter (Signed)
CY is out of the office until 11/02/18, will advise at that time.

## 2018-11-02 NOTE — Telephone Encounter (Signed)
Attempted to call pt but unable to reach her. Left message for pt to return call. 

## 2018-11-02 NOTE — Telephone Encounter (Signed)
Spiriva is not a stimulant. Something else is causing the chest pain.

## 2018-11-04 DIAGNOSIS — R944 Abnormal results of kidney function studies: Secondary | ICD-10-CM | POA: Diagnosis not present

## 2018-11-04 DIAGNOSIS — I10 Essential (primary) hypertension: Secondary | ICD-10-CM | POA: Diagnosis not present

## 2018-11-04 DIAGNOSIS — R079 Chest pain, unspecified: Secondary | ICD-10-CM | POA: Diagnosis not present

## 2018-11-04 DIAGNOSIS — R000 Tachycardia, unspecified: Secondary | ICD-10-CM | POA: Diagnosis not present

## 2018-11-04 DIAGNOSIS — R002 Palpitations: Secondary | ICD-10-CM | POA: Diagnosis not present

## 2018-11-06 NOTE — Telephone Encounter (Signed)
Attempted to call pt but unable to reach her. Left message for pt to return call. 

## 2018-11-07 NOTE — Assessment & Plan Note (Signed)
She is anxious.  Whether there is a physiologic basis for dyspnea is unclear. Plan-schedule PFT, try sample Spiriva 1.25 mg Respimat

## 2018-11-07 NOTE — Assessment & Plan Note (Signed)
He says her primary concern is that she may have sleep apnea, although her weight is about the same as it was in 2017 when study was negative.  We discussed options. Plan-Home sleep test

## 2018-11-08 ENCOUNTER — Ambulatory Visit: Payer: Self-pay | Admitting: *Deleted

## 2018-11-08 NOTE — Telephone Encounter (Signed)
Message from Sharene Skeans sent at 11/08/2018 10:41 AM EST   Summary: lab results and medication question    Pt called to inquiry about her lab results and the inquiry was made on 12.20.2019. Pt hasn't received a call back about her lab results and has a question about a medication (Pt didn't want to give the med name) / please advise          Attempted to call pt x 2, but no return call.

## 2018-11-08 NOTE — Telephone Encounter (Signed)
There is no steroid in Spiriva. If she is ok now, then that's fine.

## 2018-11-08 NOTE — Telephone Encounter (Signed)
  Message from Sharene Skeans sent at 11/08/2018 10:41 AM EST   Summary: lab results and medication question    Pt called to inquiry about her lab results and the inquiry was made on 12.20.2019. Pt hasn't received a call back about her lab results and has a question about a medication (Pt didn't want to give the med name) / please advise            Left message for pt to return call to the office.

## 2018-11-08 NOTE — Telephone Encounter (Signed)
Patient called, left VM to return call to the office to discuss lab question.

## 2018-11-08 NOTE — Telephone Encounter (Signed)
Spoke with pt. She is aware of CY's response. States that she was told by a nurse that there is a steroid in Spiriva Respimat. She has stopped taking the inhaler since originally calling in, her symptoms have resolved.  CY - please advise if you have any further recommendations for the pt. Thanks!

## 2018-11-08 NOTE — Telephone Encounter (Signed)
Attempted to contact pt. I did not receive an answer. I have left a message for pt to return our call.  

## 2018-11-09 ENCOUNTER — Telehealth: Payer: Self-pay

## 2018-11-09 NOTE — Telephone Encounter (Signed)
Pt. Called because she was concerned about her lab work she saw on My Chart - Esptein-Barr test. Asking if she needs to be concerned about that abnormal reading. Please advise pt.

## 2018-11-12 ENCOUNTER — Telehealth: Payer: Self-pay | Admitting: Family Medicine

## 2018-11-12 NOTE — Telephone Encounter (Signed)
Copied from [place] [id number]. Topic: Quick Communication - Rx Refill/Question >> Nov 12, 2018  5:41 PM Marin Olp L wrote: Medication: ONETOUCH VERIO test strip, ONETOUCH DELICA LANCETS 58T MISC  (out of both)  Has the patient contacted their pharmacy? Yes.   (Agent: If no, request that the patient contact the pharmacy for the refill.) (Agent: If yes, when and what did the pharmacy advise?) was supposed to be called in after appt on 10/19/2018 by the cma who discussed with her before Dr. Brigitte Pulse came into the room  Preferred Pharmacy (with phone number or street name): [place], [address] [place] [address] Phone: [phone] Fax: [phone]  Agent: Please be advised that RX refills may take up to 3 business days. We ask that you follow-up with your pharmacy.

## 2018-11-12 NOTE — Telephone Encounter (Signed)
lmtcb X2 for pt.  

## 2018-11-13 NOTE — Telephone Encounter (Signed)
Called patient, unable to reach. Left message to give [place] a call back. Per protocol will close encounter since patient has not been able to be reached.

## 2018-11-14 ENCOUNTER — Telehealth: Payer: Self-pay | Admitting: Family Medicine

## 2018-11-14 NOTE — Telephone Encounter (Signed)
MyChart message sent to pt about their appointment on 11/27/18 with Dr Brigitte Pulse

## 2018-11-15 ENCOUNTER — Other Ambulatory Visit: Payer: Self-pay | Admitting: Family Medicine

## 2018-11-15 ENCOUNTER — Emergency Department (HOSPITAL_COMMUNITY): Payer: Managed Care, Other (non HMO)

## 2018-11-15 ENCOUNTER — Ambulatory Visit: Payer: Self-pay

## 2018-11-15 ENCOUNTER — Encounter (HOSPITAL_COMMUNITY): Payer: Self-pay | Admitting: Emergency Medicine

## 2018-11-15 ENCOUNTER — Other Ambulatory Visit: Payer: Self-pay

## 2018-11-15 ENCOUNTER — Emergency Department (HOSPITAL_COMMUNITY)
Admission: EM | Admit: 2018-11-15 | Discharge: 2018-11-16 | Disposition: A | Discharge status: Home / Self Care | Payer: Managed Care, Other (non HMO) | Attending: Emergency Medicine | Admitting: Emergency Medicine

## 2018-11-15 DIAGNOSIS — R42 Dizziness and giddiness: Secondary | ICD-10-CM | POA: Insufficient documentation

## 2018-11-15 DIAGNOSIS — R0789 Other chest pain: Secondary | ICD-10-CM | POA: Diagnosis not present

## 2018-11-15 DIAGNOSIS — Z87891 Personal history of nicotine dependence: Secondary | ICD-10-CM | POA: Diagnosis not present

## 2018-11-15 DIAGNOSIS — I10 Essential (primary) hypertension: Secondary | ICD-10-CM | POA: Diagnosis not present

## 2018-11-15 DIAGNOSIS — E119 Type 2 diabetes mellitus without complications: Secondary | ICD-10-CM | POA: Diagnosis not present

## 2018-11-15 DIAGNOSIS — Z79899 Other long term (current) drug therapy: Secondary | ICD-10-CM | POA: Insufficient documentation

## 2018-11-15 LAB — URINALYSIS, ROUTINE W REFLEX MICROSCOPIC
Bacteria, UA: NONE SEEN
Bilirubin Urine: NEGATIVE
Glucose, UA: NEGATIVE mg/dL
Ketones, ur: NEGATIVE mg/dL
Leukocytes, UA: NEGATIVE
Nitrite: NEGATIVE
Protein, ur: NEGATIVE mg/dL
Specific Gravity, Urine: 1.006 (ref 1.005–1.030)
pH: 6 (ref 5.0–8.0)

## 2018-11-15 LAB — CBC
HCT: 38.8 % (ref 36.0–46.0)
Hemoglobin: 13 g/dL (ref 12.0–15.0)
MCH: 30.7 pg (ref 26.0–34.0)
MCHC: 33.5 g/dL (ref 30.0–36.0)
MCV: 91.5 fL (ref 80.0–100.0)
Platelets: 216 10*3/uL (ref 150–400)
RBC: 4.24 MIL/uL (ref 3.87–5.11)
RDW: 12.3 % (ref 11.5–15.5)
WBC: 7.2 10*3/uL (ref 4.0–10.5)
nRBC: 0 % (ref 0.0–0.2)

## 2018-11-15 LAB — POCT I-STAT TROPONIN I: Troponin i, poc: 0 ng/mL (ref 0.00–0.08)

## 2018-11-15 LAB — BASIC METABOLIC PANEL
Anion gap: 8 (ref 5–15)
BUN: 13 mg/dL (ref 6–20)
CO2: 23 mmol/L (ref 22–32)
Calcium: 8.9 mg/dL (ref 8.9–10.3)
Chloride: 108 mmol/L (ref 98–111)
Creatinine, Ser: 0.91 mg/dL (ref 0.44–1.00)
GFR calc Af Amer: >60 mL/min (ref >60)
GFR calc non Af Amer: >60 mL/min (ref >60)
Glucose, Bld: 99 mg/dL (ref 70–99)
Potassium: 3.9 mmol/L (ref 3.5–5.1)
Sodium: 139 mmol/L (ref 135–145)

## 2018-11-15 LAB — I-STAT BETA HCG BLOOD, ED (NOT ORDERABLE): I-stat hCG, quantitative: <5 m[IU]/mL (ref <5)

## 2018-11-15 MED ORDER — DIPHENHYDRAMINE HCL 50 MG/ML IJ SOLN
25.0000 mg | Freq: Once | INTRAMUSCULAR | Status: AC
  Administered 2018-11-15: 25 mg via INTRAVENOUS
  Filled 2018-11-15: qty 1

## 2018-11-15 NOTE — Telephone Encounter (Signed)
Pt called reporting dizziness since yesterday She states that she feels real lightheaded and changing head positions will cause the symptoms to be worse.  She rates it moderate. She has experienced vertigo in the past and say it was fixed by putting the crystal back with head movements in the office.  She feels her head has pressure. "Like having water in your head".  No fever. Bold sugar yesterday 80.  HR 76 BP 126/77. FC Katlyn was notified for help with scheduling. Pt transferred to office. No care advice read to patient.  Reason for Disposition . [1] MODERATE dizziness (e.g., interferes with normal activities) AND [2] has been evaluated by physician for this  Answer Assessment - Initial Assessment Questions 1. DESCRIPTION: "Describe your dizziness."     lightheaded 2. LIGHTHEADED: "Do you feel lightheaded?" (e.g., somewhat faint, woozy, weak upon standing)     woozy 3. VERTIGO: "Do you feel like either you or the room is spinning or tilting?" (i.e. vertigo)     no 4. SEVERITY: "How bad is it?"  "Do you feel like you are going to faint?" "Can you stand and walk?"   - MILD - walking normally   - MODERATE - interferes with normal activities (e.g., work, school)    - SEVERE - unable to stand, requires support to walk, feels like passing out now.      moderate 5. ONSET:  "When did the dizziness begin?"     yesterday 6. AGGRAVATING FACTORS: "Does anything make it worse?" (e.g., standing, change in head position)     Changing positions constant 7. HEART RATE: "Can you tell me your heart rate?" "How many beats in 15 seconds?"  (Note: not all patients can do this)     76 8. CAUSE: "What do you think is causing the dizziness?"     No clue BS 80 BP 126/77 9. RECURRENT SYMPTOM: "Have you had dizziness before?" If so, ask: "When was the last time?" "What happened that time?"     No 10. OTHER SYMPTOMS: "Do you have any other symptoms?" (e.g., fever, chest pain, vomiting, diarrhea, bleeding)    No  feel pressure in rectum 11. PREGNANCY: "Is there any chance you are pregnant?" "When was your last menstrual period?"       No  Protocols used: DIZZINESS [facility]

## 2018-11-15 NOTE — Telephone Encounter (Signed)
Requested medication (s) are due for refill today: yes  Requested medication (s) are on the active medication list: yes  Last refill:  07/19/16  Future visit scheduled: no  Notes to clinic:  Pharmacy needs a diagnosis code and "specific" directions    Requested Prescriptions  Pending Prescriptions Disp Refills   ONETOUCH VERIO test strip [Pharmacy Med Name: ONETOUCH VERIO TES] 25 each 0    Sig: TEST BLOOD SUGAR DAILY     Endocrinology: Diabetes - Testing Supplies Passed - 11/15/2018  9:33 AM      Passed - Valid encounter within last 12 months    Recent Outpatient Visits          3 weeks ago Near syncope   [facility], Laurey Arrow, MD   1 month ago Anxiety about health   [facility] at [facility], Gelene Mink, PA-C   3 months ago Rash and nonspecific skin eruption   [facility] at [facility], Fenton Malling, MD   4 months ago Essential hypertension   [facility], Laurey Arrow, MD   10 months ago Palpitations   [facility], Laurey Arrow, MD

## 2018-11-15 NOTE — ED Triage Notes (Signed)
Pt presents to the ED with chest pain and dizziness x2 days. Patient states she has been unable to sleep flat and has had positional dizziness. Patient is wearing a Holter monitor for some issues with syncope. Patient was going to see her PCP but was unable to get in until Saturday. Patient hx HTN, hypertensive on arrival but states compliance with her BP meds. Patient also complains of nausea that comes in waves.

## 2018-11-15 NOTE — ED Notes (Signed)
Bed: UO15 Expected date:  Expected time:  Means of arrival:  Comments: Bed upstairs

## 2018-11-15 NOTE — ED Provider Notes (Signed)
[facility]   [facility] DEPT Provider Note: Georgena Spurling, MD, FACEP  CSN: [id number] MRN: [id number] ARRIVAL: 11/15/18 at [place]: [place]  11/15/18 46:56 PM Brooke Turner is a 43 y.o. female with dizziness since yesterday.  She describes the dizziness as a sensation of the room spinning or feeling off balance.  Symptoms are moderate.  Symptoms are worse with movement especially movement of the head.  She denies associated tinnitus.  She has had some nausea but no vomiting.  She has also had some chest discomfort.  She describes this as occurring several times a day.  She describes it as a warm sensation in her upper chest that then moved to her face.  These episodes only last a few seconds and are mild.  She was noted to be hypertensive on arrival but she an Ativan tablet and her blood pressure normalized while waiting to be seen.    Past Medical History:  Diagnosis Date  . Anxiety   . Back pain   . Chest pain    a. 09/2014 Echo: EF 60-65%, no rwma, nl valves;  b. 12/2014 Neg ETT.  . Diabetes mellitus without complication ([place])   . Essential hypertension    a. 01/2016 Renal duplex: no RAS- ? renal cyst (renal u/s 4/17 - no cyst, nl study); b. 02/2016 24 hr BP Monitor: Mean SBP ~ 138 with Max of 198. Highest pressures noted in evening hours following placement of cuff.  . Fibroid uterus   . GERD (gastroesophageal reflux disease)   . Palpitations    a. 09/2014 nl event monitor.    Past Surgical History:  Procedure Laterality Date  . WISDOM TOOTH EXTRACTION      Family History  Problem Relation Age of Onset  . Seizures Mother   . Cancer Father        Liver  . Diabetes Father 86  . Hypertension Father   . Heart disease Father 29       CEA, LE Stenting  . Alzheimer's disease Maternal Grandmother   . Heart disease Maternal Grandfather     Social History   Tobacco Use  . Smoking status: Former Smoker   Packs/day: 0.25    Years: 10.00    Pack years: 2.50    Types: Cigarettes    Start date: 06/25/2000    Last attempt to quit: 08/19/2014    Years since quitting: 4.2  . Smokeless tobacco: Never Used  Substance Use Topics  . Alcohol use: No    Alcohol/week: 0.0 standard drinks  . Drug use: No    Prior to Admission medications   Medication Sig Start Date End Date Taking? Authorizing Provider  carvedilol (COREG) 3.125 MG tablet Take 1 tablet (3.125 mg total) by mouth 2 (two) times daily with a meal. 07/02/18  Yes [name], MD  famotidine (PEPCID) 40 MG tablet Take 0.5 tablets (20 mg total) by mouth 2 (two) times daily. 10/19/18  Yes [name], MD  fluticasone [facility]) 50 MCG/ACT nasal spray Place 2 sprays into both nostrils daily. Patient taking differently: Place 2 sprays into both nostrils daily as needed (seasonal allergies).  10/10/17  Yes Timmothy Euler, [place] D, PA-C  LORazepam (ATIVAN) 0.5 MG tablet TAKE 1 TABLET BY MOUTH EVERY 6 HOURS AS NEEDED FOR ANXIETY Patient taking differently: Take 0.25 mg by mouth every 6 (six) hours as needed for anxiety.  10/25/18  Yes [name],  MD  Multiple Vitamins-Minerals (MULTIVITAMIN GUMMIES ADULT PO) Take 2 tablets by mouth daily.    Yes [provider]  Norethindrone-Ethinyl Estradiol-Fe Biphas (LO LOESTRIN FE) 1 MG-10 MCG / 10 MCG tablet Take 1 tablet by mouth at bedtime.    Yes [provider]  OVER THE COUNTER MEDICATION Take 2 tablets by mouth daily. Vitamin B-12 gummy supplement. 109mg each gummy.   Yes [provider]  triamcinolone cream (KENALOG) 0.1 % Apply 1 application topically 2 (two) times daily. Patient taking differently: Apply 1 application topically 2 (two) times daily as needed (rash/itching).  08/06/18  Yes HPosey Boyer MD  ALPRAZolam (NIRAVAM) 0.25 MG dissolvable tablet Take 1 tablet (0.25 mg total) by mouth at bedtime as needed for anxiety. Patient not taking: Reported on 10/30/2018 10/01/18    McVey, EGelene Mink PA-C  blood glucose meter kit and supplies Test blood sugar twice daily. Dx code: R73.09 07/22/15   SShawnee Knapp MD  metoCLOPramide (REGLAN) 5 MG tablet Take 1 tablet (5 mg total) by mouth 4 (four) times daily. Patient not taking: Reported on 10/30/2018 10/19/18   SShawnee Knapp MD  [facility] LPDELICA LANCETS 355DMISC USE TO TEST BLOOD SUGAR DAILY 10/17/16   SShawnee Knapp MD  [facility] test strip TEST BLOOD SUGAR DAILY 07/19/16   SShawnee Knapp MD  Tiotropium Bromide Monohydrate (SPIRIVA RESPIMAT) 1.25 MCG/ACT AERS Inhale 2 puffs into the lungs daily. Patient not taking: Reported on 10/30/2018 10/25/18   YDeneise Lever MD    Allergies Penicillins; Tamiflu [oseltamivir phosphate]; Tinidazole; and Diltiazem   REVIEW OF SYSTEMS  Negative except as noted here or in the History of Present Illness.   PHYSICAL EXAMINATION  Initial Vital Signs Blood pressure 125/82, pulse 75, temperature 98 F (36.7 C), temperature source Oral, resp. rate 18, SpO2 97 %.  Examination General: Well-developed, well-nourished female in no acute distress; appearance consistent with age of record HENT: normocephalic; atraumatic Eyes: pupils equal, round and reactive to light; extraocular muscles intact; no nystagmus Neck: supple Heart: regular rate and rhythm; no murmur Lungs: clear to auscultation bilaterally Abdomen: soft; nondistended; nontender; bowel sounds present Extremities: No deformity; full range of motion; pulses normal Neurologic: Awake, alert and oriented; motor function intact in all extremities and symmetric; no facial droop Skin: Warm and dry Psychiatric: Normal mood and affect   RESULTS  Summary of this visit's results, reviewed by myself:   EKG Interpretation  Date/Time:  Thursday November 15 2018 19:08:26 EST Ventricular Rate:  83 PR Interval:    QRS Duration: 84 QT Interval:  359 QTC Calculation: 422 R Axis:   26 Text Interpretation:  Sinus rhythm Left  atrial enlargement Low voltage, precordial leads Baseline wander in lead(s) V4 Rate is slower Confirmed by Jayanth Szczesniak ([phone] on 11/15/2018 11:17:24 PM      Laboratory Studies: Results for orders placed or performed during the hospital encounter of 11/15/18 (from the past 24 hour(s))  Urinalysis, Routine w reflex microscopic     Status: Abnormal   Collection Time: 11/15/18  7:34 PM  Result Value Ref Range   Color, Urine STRAW (A) YELLOW   APPearance CLEAR CLEAR   Specific Gravity, Urine 1.006 1.005 - 1.030   pH 6.0 5.0 - 8.0   Glucose, UA NEGATIVE NEGATIVE mg/dL   Hgb urine dipstick SMALL (A) NEGATIVE   Bilirubin Urine NEGATIVE NEGATIVE   Ketones, ur NEGATIVE NEGATIVE mg/dL   Protein, ur NEGATIVE NEGATIVE mg/dL   Nitrite NEGATIVE NEGATIVE  Leukocytes, UA NEGATIVE NEGATIVE   RBC / HPF 0-5 0 - 5 RBC/hpf   Bacteria, UA NONE SEEN NONE SEEN  Basic metabolic panel     Status: None   Collection Time: 11/15/18  8:02 PM  Result Value Ref Range   Sodium 139 135 - 145 mmol/L   Potassium 3.9 3.5 - 5.1 mmol/L   Chloride 108 98 - 111 mmol/L   CO2 23 22 - 32 mmol/L   Glucose, Bld 99 70 - 99 mg/dL   BUN 13 6 - 20 mg/dL   Creatinine, Ser 0.91 0.44 - 1.00 mg/dL   Calcium 8.9 8.9 - 10.3 mg/dL   GFR calc non Af Amer >60 >60 mL/min   GFR calc Af Amer >60 >60 mL/min   Anion gap 8 5 - 15  CBC     Status: None   Collection Time: 11/15/18  8:02 PM  Result Value Ref Range   WBC 7.2 4.0 - 10.5 K/uL   RBC 4.24 3.87 - 5.11 MIL/uL   Hemoglobin 13.0 12.0 - 15.0 g/dL   HCT 38.8 36.0 - 46.0 %   MCV 91.5 80.0 - 100.0 fL   MCH 30.7 26.0 - 34.0 pg   MCHC 33.5 30.0 - 36.0 g/dL   RDW 12.3 11.5 - 15.5 %   Platelets 216 150 - 400 K/uL   nRBC 0.0 0.0 - 0.2 %  POCT i-Stat troponin I     Status: None   Collection Time: 11/15/18  8:09 PM  Result Value Ref Range   Troponin i, poc 0.00 0.00 - 0.08 ng/mL   Comment 3          I-Stat beta hCG blood, ED     Status: None   Collection Time: 11/15/18  8:09 PM    Result Value Ref Range   I-stat hCG, quantitative <5.0 <5 mIU/mL   Comment 3           Imaging Studies: Dg Chest 2 View  Result Date: 11/15/2018 CLINICAL DATA:  Chest pain and dizziness EXAM: CHEST - 2 VIEW COMPARISON:  10/26/2018 FINDINGS: The heart size and mediastinal contours are within normal limits. Both lungs are clear. The visualized skeletal structures are unremarkable. Electronic device over the anterior chest. IMPRESSION: No active cardiopulmonary disease. Electronically Signed   By: Donavan Foil M.D.   On: 11/15/2018 19:46    ED COURSE and MDM  Nursing notes and initial vitals signs, including pulse oximetry, reviewed.  Vitals:   11/16/18 0130 11/16/18 0200 11/16/18 0230 11/16/18 0300  BP: 119/75 116/72 125/70 (!) 143/93  Pulse: 67 69 71 80  Resp: 17 18 17 20   Temp:      TempSrc:      SpO2: 99% 100% 99% 100%   3:26 AM Patient was able to sleep after IV Benadryl.  Vertigo is improved.  It is still reproducible with turning of her head to the left but not to the right.  Will prescribe meclizine and she plans to follow-up with her PCP tomorrow.  PROCEDURES    ED DIAGNOSES     ICD-10-CM   1. Vertigo R42       Therma Lasure, MD 11/16/18 [phone]

## 2018-11-16 ENCOUNTER — Telehealth: Payer: Self-pay | Admitting: Family Medicine

## 2018-11-16 ENCOUNTER — Other Ambulatory Visit: Payer: Self-pay

## 2018-11-16 MED ORDER — GLUCOSE BLOOD VI STRP: ORAL_STRIP | 10 refills | Status: DC

## 2018-11-16 MED ORDER — MECLIZINE HCL 25 MG PO TABS: 25.0000 mg | ORAL_TABLET | Freq: Three times a day (TID) | ORAL | 0 refills | Status: DC | PRN

## 2018-11-16 MED ORDER — ONETOUCH DELICA LANCETS 33G MISC: 3 refills | Status: AC

## 2018-11-16 NOTE — Telephone Encounter (Signed)
Pt states these pills are impossible to cut. Please advise

## 2018-11-16 NOTE — Telephone Encounter (Signed)
Attempted to call patient to clarify if she is asking for a 20 mg tab. She has the 40 mg tab that she breaks in half prescribed already Left message for the patient to call back.

## 2018-11-16 NOTE — Telephone Encounter (Signed)
Copied from [place] [phone]. Topic: Quick Communication - Rx Refill/Question >> Nov 16, 2018  1:39 PM Keene Breath wrote: Medication: famotidine (PEPCID) 40 MG tablet, requesting the 20 MG  Patient called to request a refill for the above medication, 20 MG  Preferred Pharmacy (with phone number or street name): [facility], [place] [phone] (Phone) [phone] (Fax)

## 2018-11-16 NOTE — Telephone Encounter (Signed)
Patient is calling to check on her refill for [facility] DELICA LANCETS 80Y MISC and  ONETOUCH VERIO test strip.  Patient is out of both medications and she has requested them since 11/12/2018.  Please advise and call patient to let her know when she can pick these up from pharmacy.  Patient CB # [phone]

## 2018-11-16 NOTE — ED Notes (Signed)
Pt verbalized discharge instructions and follow up care. Alert and ambulatory  

## 2018-11-17 ENCOUNTER — Emergency Department (HOSPITAL_COMMUNITY): Payer: Managed Care, Other (non HMO)

## 2018-11-17 ENCOUNTER — Other Ambulatory Visit: Payer: Self-pay

## 2018-11-17 ENCOUNTER — Ambulatory Visit: Payer: 59 | Admitting: Osteopathic Medicine

## 2018-11-17 ENCOUNTER — Encounter (HOSPITAL_COMMUNITY): Payer: Self-pay | Admitting: Emergency Medicine

## 2018-11-17 ENCOUNTER — Emergency Department (HOSPITAL_COMMUNITY)
Admission: EM | Admit: 2018-11-17 | Discharge: 2018-11-18 | Disposition: A | Discharge status: Home / Self Care | Payer: Managed Care, Other (non HMO) | Attending: Emergency Medicine | Admitting: Emergency Medicine

## 2018-11-17 DIAGNOSIS — I10 Essential (primary) hypertension: Secondary | ICD-10-CM | POA: Diagnosis not present

## 2018-11-17 DIAGNOSIS — R079 Chest pain, unspecified: Secondary | ICD-10-CM

## 2018-11-17 DIAGNOSIS — E119 Type 2 diabetes mellitus without complications: Secondary | ICD-10-CM | POA: Insufficient documentation

## 2018-11-17 DIAGNOSIS — R42 Dizziness and giddiness: Secondary | ICD-10-CM | POA: Insufficient documentation

## 2018-11-17 DIAGNOSIS — Z87891 Personal history of nicotine dependence: Secondary | ICD-10-CM | POA: Insufficient documentation

## 2018-11-17 LAB — CBC
HCT: 38.6 % (ref 36.0–46.0)
Hemoglobin: 13.1 g/dL (ref 12.0–15.0)
MCH: 31 pg (ref 26.0–34.0)
MCHC: 33.9 g/dL (ref 30.0–36.0)
MCV: 91.3 fL (ref 80.0–100.0)
Platelets: 232 10*3/uL (ref 150–400)
RBC: 4.23 MIL/uL (ref 3.87–5.11)
RDW: 12.3 % (ref 11.5–15.5)
WBC: 6.8 10*3/uL (ref 4.0–10.5)
nRBC: 0 % (ref 0.0–0.2)

## 2018-11-17 LAB — BASIC METABOLIC PANEL WITH GFR
Anion gap: 10 (ref 5–15)
BUN: 15 mg/dL (ref 6–20)
CO2: 22 mmol/L (ref 22–32)
Calcium: 8.8 mg/dL — ABNORMAL LOW (ref 8.9–10.3)
Chloride: 109 mmol/L (ref 98–111)
Creatinine, Ser: 0.96 mg/dL (ref 0.44–1.00)
GFR calc Af Amer: >60 mL/min (ref >60)
GFR calc non Af Amer: >60 mL/min (ref >60)
Glucose, Bld: 135 mg/dL — ABNORMAL HIGH (ref 70–99)
Potassium: 3.5 mmol/L (ref 3.5–5.1)
Sodium: 141 mmol/L (ref 135–145)

## 2018-11-17 LAB — I-STAT BETA HCG BLOOD, ED (NOT ORDERABLE): I-stat hCG, quantitative: <5 m[IU]/mL (ref <5)

## 2018-11-17 LAB — POCT I-STAT TROPONIN I: Troponin i, poc: 0 ng/mL (ref 0.00–0.08)

## 2018-11-17 NOTE — ED Triage Notes (Signed)
Patient complaining of chest pain and nausea. Patient states the mid chest pain has been going all day and has been getting worse. Patient states is getting lightheaded and dizzy. The nausea started tonight. Patient is also complaining of pain in her arm.

## 2018-11-18 NOTE — ED Notes (Signed)
PT DISCHARGED. INSTRUCTIONS GIVEN. AAOX4. PT IN NO APPARENT DISTRESS WITH MILD PAIN. THE OPPORTUNITY TO ASK QUESTIONS WAS PROVIDED. 

## 2018-11-18 NOTE — ED Provider Notes (Signed)
Emergency Department Provider Note   I have reviewed the triage vital signs and the nursing notes.   HISTORY  Chief Complaint Chest Pain   HPI Brooke Turner is a 43 y.o. female who presents the emergency department for the seventh time in 2 months secondary to vague symptoms.  This time it is a sharp chest pain that is intermittent in nature and retrosternal associated with some of her lightheadedness.  Started today.  Comes and goes without warning.  Not related to exertion.  No associated nausea, vomiting, diaphoresis or shortness of breath.  No trauma to her chest.  No recent illnesses.  No history of reflux.  Has taken her medications as she supposed to. No other associated or modifying symptoms.    Past Medical History:  Diagnosis Date  . Anxiety   . Back pain   . Chest pain    a. 09/2014 Echo: EF 60-65%, no rwma, nl valves;  b. 12/2014 Neg ETT.  . Diabetes mellitus without complication ([place])   . Essential hypertension    a. 01/2016 Renal duplex: no RAS- ? renal cyst (renal u/s 4/17 - no cyst, nl study); b. 02/2016 24 hr BP Monitor: Mean SBP ~ 138 with Max of 198. Highest pressures noted in evening hours following placement of cuff.  . Fibroid uterus   . GERD (gastroesophageal reflux disease)   . Palpitations    a. 09/2014 nl event monitor.    Patient Active Problem List   Diagnosis Date Noted  . Left facial pain 09/20/2016  . Head trauma 09/20/2016  . Palpitations   . Chest pain   . Left sided numbness 06/11/2015  . Chronic tension-type headache, not intractable 06/10/2015  . Dyspnea 03/18/2015  . Post-traumatic stress reaction 02/25/2015  . Anxiety disorder due to general medical condition with panic attack 02/25/2015  . Subserous leiomyoma of uterus 02/25/2015  . Anxiety about health 02/25/2015  . Deviated nasal septum 12/30/2014  . Chronic rhinitis 12/30/2014  . Chronic maxillary sinusitis 12/30/2014  . Snorings 09/30/2014  . Fatigue 09/26/2014  .  Dizziness and giddiness 09/11/2014  . Tachycardia 09/11/2014  . Precordial chest pain 09/11/2014  . Prediabetes 08/26/2014  . Acanthosis nigricans 03/19/2014  . Vitamin D deficiency 07/15/2013  . Large breasts 09/12/2012  . Essential hypertension 02/17/2012  . Contraception management 02/17/2012  . Obesity (BMI 30-39.9) previous BMI was over 40. Patient has lost 70 pounds in the last year 02/17/2012  . Anxiety disorder 02/17/2012  . Chronic back pain 02/17/2012    Past Surgical History:  Procedure Laterality Date  . WISDOM TOOTH EXTRACTION      Current Outpatient Rx  . Order #: [id number] Class: Normal  . Order #: [id number] Class: Normal  . Order #: [id number] Class: Normal  . Order #: [id number] Class: Normal  . Order #: [id number] Class: Normal  . Order #: [id number] Class: Normal  . Order #: [id number] Class: Normal  . Order #: [id number] Class: Print  . Order #: [id number] Class: Normal  . Order #: [id number] Class: Historical Med  . Order #: [id number] Class: Historical Med  . Order #: [id number] Class: Normal  . Order #: [id number] Class: Historical Med  . Order #: [id number] Class: Sample  . Order #: [id number] Class: Normal    Allergies Penicillins; Tamiflu [oseltamivir phosphate]; Tinidazole; and Diltiazem  Family History  Problem Relation Age of Onset  . Seizures Mother   . Cancer Father        Liver  . Diabetes Father 90  . Hypertension Father   . Heart disease Father  60       CEA, LE Stenting  . Alzheimer's disease Maternal Grandmother   . Heart disease Maternal Grandfather     Social History Social History   Tobacco Use  . Smoking status: Former Smoker    Packs/day: 0.25    Years: 10.00    Pack years: 2.50    Types: Cigarettes    Start date: 06/25/2000    Last attempt to quit: 08/19/2014    Years since quitting: 4.2  . Smokeless tobacco: Never Used  Substance Use Topics  . Alcohol use: No    Alcohol/week: 0.0 standard drinks  . Drug use: No    Review of  Systems  All other systems negative except as documented in the HPI. All pertinent positives and negatives as reviewed in the HPI. ____________________________________________   PHYSICAL EXAM:  VITAL SIGNS: ED Triage Vitals  Enc Vitals Group     BP 11/17/18 2258 (!) 188/102     Pulse Rate 11/17/18 2258 89     Resp 11/17/18 2258 18     Temp 11/17/18 2258 98.5 F (36.9 C)     Temp Source 11/17/18 2258 Oral     SpO2 11/17/18 2258 100 %     Weight 11/17/18 2312 164 lb (74.4 kg)     Height 11/17/18 2312 5\' 4"  (1.626 m)     Head Circumference --      Peak Flow --      Pain Score 11/17/18 2312 8     Pain Loc --      Pain Edu? --      Excl. in [place]? --     Constitutional: Alert and oriented. Well appearing and in no acute distress. Eyes: Conjunctivae are normal. PERRL. EOMI. Head: Atraumatic. Nose: No congestion/rhinnorhea. Mouth/Throat: Mucous membranes are moist.  Oropharynx non-erythematous. Neck: No stridor.  No meningeal signs.   Cardiovascular: Normal rate, regular rhythm. Good peripheral circulation. Grossly normal heart sounds.   Respiratory: Normal respiratory effort.  No retractions. Lungs CTAB. Gastrointestinal: Soft and nontender. No distention.  Musculoskeletal: No lower extremity tenderness nor edema. No gross deformities of extremities. Neurologic:  Normal speech and language. No gross focal neurologic deficits are appreciated.  Skin:  Skin is warm, dry and intact. No rash noted.   ____________________________________________   LABS (all labs ordered are listed, but only abnormal results are displayed)  Labs Reviewed  BASIC METABOLIC PANEL - Abnormal; Notable for the following components:      Result Value   Glucose, Bld 135 (*)    Calcium 8.8 (*)    All other components within normal limits  CBC  I-STAT TROPONIN, ED  I-STAT BETA HCG BLOOD, ED ([facility], [facility], [facility] ONLY)  I-STAT BETA HCG BLOOD, ED (NOT ORDERABLE)  POCT I-STAT TROPONIN I    ____________________________________________  EKG   EKG Interpretation  Date/Time:  Saturday November 17 2018 23:04:18 EST Ventricular Rate:  85 PR Interval:    QRS Duration: 86 QT Interval:  352 QTC Calculation: 419 R Axis:   61 Text Interpretation:  Sinus rhythm Anteroseptal infarct, age indeterminate No significant change since last tracing Confirmed by Merrily Pew [phone]) on 11/18/2018 12:14:10 AM       ____________________________________________  RADIOLOGY  Dg Chest 2 View  Result Date: 11/17/2018 CLINICAL DATA:  Chest pain all day. Lightheadedness and dizziness. Nausea starting tonight. EXAM: CHEST - 2 VIEW COMPARISON:  11/15/2018 FINDINGS: The heart size and mediastinal contours are within normal limits. Both lungs are clear. The visualized  skeletal structures are unremarkable. IMPRESSION: No active cardiopulmonary disease. Electronically Signed   By: Lucienne Capers M.D.   On: 11/17/2018 23:44    ____________________________________________   PROCEDURES  Procedure(s) performed:   Procedures   ____________________________________________   INITIAL IMPRESSION / ASSESSMENT AND PLAN / ED COURSE  Very atypical sounding chest pain.  Secondary to multiple vague complaints that she is been here for recently I asked her if she was safe at home or had any other issues and she does not.  Patient's EKG is fine troponin is normal.  She had initial high blood pressure which resolved prior to my evaluation without any intervention.  Low suspicion for PE as she is PERC negative.  Unclear etiology for her chest pain but low risk for emergent causes at this time.  Stable for discharge with cardiology and PCP follow-up.     Pertinent labs & imaging results that were available during my care of the patient were reviewed by me and considered in my medical decision making (see chart for details).  ____________________________________________  FINAL CLINICAL IMPRESSION(S) /  ED DIAGNOSES  Final diagnoses:  Nonspecific chest pain     MEDICATIONS GIVEN DURING THIS VISIT:  Medications - No data to display   NEW OUTPATIENT MEDICATIONS STARTED DURING THIS VISIT:  New Prescriptions   No medications on file    Note:  This note was prepared with assistance of Dragon voice recognition software. Occasional wrong-word or sound-a-like substitutions may have occurred due to the inherent limitations of voice recognition software.   Israa Caban, Corene Cornea, MD 11/18/18 0110

## 2018-11-19 ENCOUNTER — Emergency Department (HOSPITAL_COMMUNITY)
Admission: EM | Admit: 2018-11-19 | Discharge: 2018-11-19 | Disposition: A | Discharge status: Home / Self Care | Payer: Managed Care, Other (non HMO) | Attending: Emergency Medicine | Admitting: Emergency Medicine

## 2018-11-19 ENCOUNTER — Encounter (HOSPITAL_COMMUNITY): Payer: Self-pay | Admitting: *Deleted

## 2018-11-19 ENCOUNTER — Emergency Department (HOSPITAL_COMMUNITY): Payer: Managed Care, Other (non HMO)

## 2018-11-19 ENCOUNTER — Other Ambulatory Visit: Payer: Self-pay

## 2018-11-19 ENCOUNTER — Telehealth: Payer: Self-pay | Admitting: *Deleted

## 2018-11-19 DIAGNOSIS — R55 Syncope and collapse: Secondary | ICD-10-CM | POA: Diagnosis present

## 2018-11-19 DIAGNOSIS — R002 Palpitations: Secondary | ICD-10-CM | POA: Insufficient documentation

## 2018-11-19 DIAGNOSIS — Z87891 Personal history of nicotine dependence: Secondary | ICD-10-CM | POA: Insufficient documentation

## 2018-11-19 DIAGNOSIS — I10 Essential (primary) hypertension: Secondary | ICD-10-CM | POA: Diagnosis not present

## 2018-11-19 DIAGNOSIS — E119 Type 2 diabetes mellitus without complications: Secondary | ICD-10-CM | POA: Insufficient documentation

## 2018-11-19 DIAGNOSIS — Z79899 Other long term (current) drug therapy: Secondary | ICD-10-CM | POA: Insufficient documentation

## 2018-11-19 LAB — CBC
HCT: 40.5 % (ref 36.0–46.0)
Hemoglobin: 13.8 g/dL (ref 12.0–15.0)
MCH: 30.9 pg (ref 26.0–34.0)
MCHC: 34.1 g/dL (ref 30.0–36.0)
MCV: 90.8 fL (ref 80.0–100.0)
Platelets: 261 10*3/uL (ref 150–400)
RBC: 4.46 MIL/uL (ref 3.87–5.11)
RDW: 12.3 % (ref 11.5–15.5)
WBC: 5.3 10*3/uL (ref 4.0–10.5)
nRBC: 0 % (ref 0.0–0.2)

## 2018-11-19 LAB — BASIC METABOLIC PANEL
Anion gap: 8 (ref 5–15)
BUN: 11 mg/dL (ref 6–20)
CO2: 21 mmol/L — ABNORMAL LOW (ref 22–32)
Calcium: 8.6 mg/dL — ABNORMAL LOW (ref 8.9–10.3)
Chloride: 109 mmol/L (ref 98–111)
Creatinine, Ser: 0.95 mg/dL (ref 0.44–1.00)
GFR calc Af Amer: >60 mL/min (ref >60)
GFR calc non Af Amer: >60 mL/min (ref >60)
Glucose, Bld: 107 mg/dL — ABNORMAL HIGH (ref 70–99)
Potassium: 3.7 mmol/L (ref 3.5–5.1)
Sodium: 138 mmol/L (ref 135–145)

## 2018-11-19 LAB — I-STAT TROPONIN, ED: Troponin i, poc: 0 ng/mL (ref 0.00–0.08)

## 2018-11-19 LAB — I-STAT BETA HCG BLOOD, ED (MC, WL, AP ONLY): I-stat hCG, quantitative: <5 m[IU]/mL (ref <5)

## 2018-11-19 NOTE — ED Notes (Signed)
Patient verbalizes understanding of discharge instructions. Opportunity for questioning and answers were provided. Pt discharged from ED. 

## 2018-11-19 NOTE — ED Notes (Signed)
Pts IV has been discontinued.

## 2018-11-19 NOTE — Telephone Encounter (Signed)
I would not suggest a 24 hour BP monitor.  She should keep a BP cuff and record the BP when she has the feeling.

## 2018-11-19 NOTE — ED Triage Notes (Signed)
Pt had episode at work of near syncope, pt was sitting at her desk and had sensation that she would pass out, became lightheaded with dizziness and blurred vision, also had palpitations, pt currently wearing holter monitor due to similar symptoms

## 2018-11-19 NOTE — Telephone Encounter (Signed)
Noted.  No arrhythmias.  No change in therapy at this time.

## 2018-11-19 NOTE — ED Provider Notes (Signed)
[facility]PART[facility] Provider Note   CSN: [id number] Arrival date & time: 11/19/18  1202     History   Chief Complaint Chief Complaint  Patient presents with  . Near Syncope    HPI Brooke Turner is a 43 y.o. female.  HPI   Pt states she was sitting at her desk when she became winded and felt somewhat short of breath. She then became lightheaded, warm and felt like she was going to pass out. States that her HR became fast. States that she had a light burning in the chest for the last few days. She currently feels short of breath. She has been seen in the ED multiple times for similar complaints and has had multiple negative workups. She currently follows with cardiology, Dr. Percival Spanish. She is wearing a 30 day holter monitor. She states that she called her cardiologist office PTA and was told to come to the ED.   States she recently started taking meclizine. She did not take it today.   Past Medical History:  Diagnosis Date  . Anxiety   . Back pain   . Chest pain    a. 09/2014 Echo: EF 60-65%, no rwma, nl valves;  b. 12/2014 Neg ETT.  . Diabetes mellitus without complication ([place])   . Essential hypertension    a. 01/2016 Renal duplex: no RAS- ? renal cyst (renal u/s 4/17 - no cyst, nl study); b. 02/2016 24 hr BP Monitor: Mean SBP ~ 138 with Max of 198. Highest pressures noted in evening hours following placement of cuff.  . Fibroid uterus   . GERD (gastroesophageal reflux disease)   . Palpitations    a. 09/2014 nl event monitor.    Patient Active Problem List   Diagnosis Date Noted  . Left facial pain 09/20/2016  . Head trauma 09/20/2016  . Palpitations   . Chest pain   . Left sided numbness 06/11/2015  . Chronic tension-type headache, not intractable 06/10/2015  . Dyspnea 03/18/2015  . Post-traumatic stress reaction 02/25/2015  . Anxiety disorder due to general medical condition with panic attack 02/25/2015  . Subserous leiomyoma  of uterus 02/25/2015  . Anxiety about health 02/25/2015  . Deviated nasal septum 12/30/2014  . Chronic rhinitis 12/30/2014  . Chronic maxillary sinusitis 12/30/2014  . Snorings 09/30/2014  . Fatigue 09/26/2014  . Dizziness and giddiness 09/11/2014  . Tachycardia 09/11/2014  . Precordial chest pain 09/11/2014  . Prediabetes 08/26/2014  . Acanthosis nigricans 03/19/2014  . Vitamin D deficiency 07/15/2013  . Large breasts 09/12/2012  . Essential hypertension 02/17/2012  . Contraception management 02/17/2012  . Obesity (BMI 30-39.9) previous BMI was over 40. Patient has lost 70 pounds in the last year 02/17/2012  . Anxiety disorder 02/17/2012  . Chronic back pain 02/17/2012    Past Surgical History:  Procedure Laterality Date  . WISDOM TOOTH EXTRACTION       OB History    Gravida  2   Para  0   Term  0   Preterm  0   AB  2   Living  0     SAB  1   TAB  1   Ectopic  0   Multiple  0   Live Births               Home Medications    Prior to Admission medications   Medication Sig Start Date End Date Taking? Authorizing Provider  carvedilol (COREG) 3.125 MG tablet Take  1 tablet (3.125 mg total) by mouth 2 (two) times daily with a meal. 07/02/18  Yes [name], MD  famotidine (PEPCID) 40 MG tablet Take 0.5 tablets (20 mg total) by mouth 2 (two) times daily. 10/19/18  Yes [name], MD  fluticasone [facility]) 50 MCG/ACT nasal spray Place 2 sprays into both nostrils daily. Patient taking differently: Place 2 sprays into both nostrils daily as needed (seasonal allergies).  10/10/17  Yes Timmothy Euler, [place] D, PA-C  LORazepam (ATIVAN) 0.5 MG tablet TAKE 1 TABLET BY MOUTH EVERY 6 HOURS AS NEEDED FOR ANXIETY Patient taking differently: Take 0.25 mg by mouth every 6 (six) hours as needed for anxiety.  10/25/18  Yes [name], MD  meclizine (ANTIVERT) 25 MG tablet Take 1 tablet (25 mg total) by mouth 3 (three) times daily as needed for dizziness. 11/16/18  Yes Molpus,  John, MD  Multiple Vitamins-Minerals (MULTIVITAMIN GUMMIES ADULT PO) Take 2 tablets by mouth daily.    Yes [provider]  Norethindrone-Ethinyl Estradiol-Fe Biphas (LO LOESTRIN FE) 1 MG-10 MCG / 10 MCG tablet Take 1 tablet by mouth at bedtime.    Yes [provider]  OVER THE COUNTER MEDICATION Take 1 tablet by mouth daily. Vitamin B-12 gummy supplement. 1015mg each gummy.    Yes [provider]  Tiotropium Bromide Monohydrate (SPIRIVA RESPIMAT) 1.25 MCG/ACT AERS Inhale 2 puffs into the lungs daily. Patient taking differently: Inhale 2 puffs into the lungs as needed (sob/wheezing).  10/25/18  Yes Young, CTarri FullerD, MD  triamcinolone cream (KENALOG) 0.1 % Apply 1 application topically 2 (two) times daily. Patient taking differently: Apply 1 application topically 2 (two) times daily as needed (rash/itching).  08/06/18  Yes HPosey Boyer MD  ALPRAZolam (NIRAVAM) 0.25 MG dissolvable tablet Take 1 tablet (0.25 mg total) by mouth at bedtime as needed for anxiety. Patient not taking: Reported on 10/30/2018 10/01/18   McVey, EGelene Mink PA-C  blood glucose meter kit and supplies Test blood sugar twice daily. Dx code: R73.09 07/22/15   SShawnee Knapp MD  glucose blood ([facility]) test strip TEST BLOOD SUGAR DAILY 11/16/18   SShawnee Knapp MD  metoCLOPramide (REGLAN) 5 MG tablet Take 1 tablet (5 mg total) by mouth 4 (four) times daily. Patient not taking: Reported on 10/30/2018 10/19/18   SShawnee Knapp MD  [facility] LANC[facility] 389VMISC USE TO TEST BLOOD SUGAR DAILY 11/16/18   SShawnee Knapp MD    Family History Family History  Problem Relation Age of Onset  . Seizures Mother   . Cancer Father        Liver  . Diabetes Father 612 . Hypertension Father   . Heart disease Father 656      CEA, LE [place]  . Alzheimer's disease Maternal Grandmother   . Heart disease Maternal Grandfather     Social History Social History   Tobacco Use  . Smoking status: Former  Smoker    Packs/day: 0.25    Years: 10.00    Pack years: 2.50    Types: Cigarettes    Start date: 06/25/2000    Last attempt to quit: 08/19/2014    Years since quitting: 4.2  . Smokeless tobacco: Never Used  Substance Use Topics  . Alcohol use: No    Alcohol/week: 0.0 standard drinks  . Drug use: No     Allergies   Penicillins; Tamiflu [oseltamivir phosphate]; Tinidazole; and Diltiazem   Review of Systems Review of Systems  Constitutional: Negative for chills and fever.  HENT: Negative for ear pain and sore throat.   Eyes: Negative for pain and visual disturbance.  Respiratory: Positive for shortness of breath. Negative for cough.   Cardiovascular: Positive for palpitations. Negative for chest pain.       Chest burning  Gastrointestinal: Negative for abdominal pain and vomiting.  Genitourinary: Negative for dysuria and hematuria.  Musculoskeletal: Negative for arthralgias and back pain.  Skin: Negative for color change and rash.  Neurological: Positive for light-headedness. Negative for syncope.       Near syncope  All other systems reviewed and are negative.    Physical Exam Updated Vital Signs BP (!) 162/98   Pulse 68   Resp 20   SpO2 100%   Physical Exam Vitals signs and nursing note reviewed.  Constitutional:      General: She is not in acute distress.    Appearance: She is well-developed. She is not ill-appearing or toxic-appearing.  HENT:     Head: Normocephalic and atraumatic.     Mouth/Throat:     Mouth: Mucous membranes are moist.  Eyes:     Conjunctiva/sclera: Conjunctivae normal.  Neck:     Musculoskeletal: Neck supple.  Cardiovascular:     Rate and Rhythm: Normal rate and regular rhythm.     Heart sounds: Normal heart sounds. No murmur.  Pulmonary:     Effort: Pulmonary effort is normal. No respiratory distress.     Breath sounds: Normal breath sounds.  Abdominal:     General: Bowel sounds are normal.     Palpations: Abdomen is soft.      Tenderness: There is no abdominal tenderness. There is no guarding.  Musculoskeletal: Normal range of motion.        General: No tenderness.     Right lower leg: No edema.     Left lower leg: No edema.  Skin:    General: Skin is warm and dry.  Neurological:     Mental Status: She is alert.      ED Treatments / Results  Labs (all labs ordered are listed, but only abnormal results are displayed) Labs Reviewed  BASIC METABOLIC PANEL - Abnormal; Notable for the following components:      Result Value   CO2 21 (*)    Glucose, Bld 107 (*)    Calcium 8.6 (*)    All other components within normal limits  CBC  I-STAT TROPONIN, ED  I-STAT BETA HCG BLOOD, ED ([facility], [facility], [facility] ONLY)    EKG EKG Interpretation  Date/Time:  Monday November 19 2018 12:10:32 EST Ventricular Rate:  92 PR Interval:  148 QRS Duration: 84 QT Interval:  340 QTC Calculation: 420 R Axis:   43 Text Interpretation:  Normal sinus rhythm Normal ECG Confirmed by Dene Gentry [phone]) on 11/19/2018 12:30:35 PM   Radiology Dg Chest 2 View  Result Date: 11/19/2018 CLINICAL DATA:  Lightheadedness EXAM: CHEST - 2 VIEW COMPARISON:  11/17/2018 FINDINGS: The heart size and mediastinal contours are within normal limits. Both lungs are clear. The visualized skeletal structures are unremarkable. IMPRESSION: No active cardiopulmonary disease. Electronically Signed   By: Inez Catalina M.D.   On: 11/19/2018 13:42   Dg Chest 2 View  Result Date: 11/17/2018 CLINICAL DATA:  Chest pain all day. Lightheadedness and dizziness. Nausea starting tonight. EXAM: CHEST - 2 VIEW COMPARISON:  11/15/2018 FINDINGS: The heart size and mediastinal contours are within normal limits. Both lungs are clear. The visualized skeletal structures  are unremarkable. IMPRESSION: No active cardiopulmonary disease. Electronically Signed   By: Lucienne Capers M.D.   On: 11/17/2018 23:44    Procedures Procedures (including critical care time)  Medications  Ordered in ED Medications - No data to display   Initial Impression / Assessment and Plan / ED Course  I have reviewed the triage vital signs and the nursing notes.  Pertinent labs & imaging results that were available during my care of the patient were reviewed by me and considered in my medical decision making (see chart for details).     Final Clinical Impressions(s) / ED Diagnoses   Final diagnoses:  Near syncope  Palpitations   Patient was the ED today for evaluation of near syncopal episode that occurred just prior to arrival.  Was associated with palpitations and a feeling of lightheadedness.  She did not actually pass out.  She is currently being followed by cardiology and is wearing a 30-day Holter monitor for similar episodes.  She has been seen in the ED multiple times and has had negative work-ups during her evaluations.  She recently had TSH drawn which was normal.  Today on evaluation she had an episode with similar symptoms during her evaluation and heart rate showed sinus tach on the monitor in the 120s.  Patient then felt very anxious.  After several seconds her heart rate slowed again and her symptoms resolved spontaneously.  CBC without leukocytosis or anemia. BMP with normal electrolytes, slightly low bicarb. Beta hCG negative. Troponin negative. EKG with NSR.  CXR negative  Reviewed records. Dr. Percival Spanish reviewed rhythm on pts holter monitor and noted noted arrhythmias. Based on note, he advised no change in therapy at this time.  Informed pt of the results of her workup. Workup reassuring and I feel that outpatient management of her symptoms is appropriate. Have advised her to contact Dr. Rosezella [place] office to make an appt for f/u. Advised her to return to the ED for any new or worsening sxs. She voices understanding of the plan and reasons to return. All questions answered.  ED Discharge Orders    None       Bishop Dublin 11/19/18 1435      Valarie Merino, MD 11/20/18 1110

## 2018-11-19 NOTE — Telephone Encounter (Signed)
Received a call from patient that she was sitting at her desk and she was unable to focus and felt like she was going to pass out, wearing a monitor and did press button. She is currently feeling lightheaded and like her heart is beating fast. Advised patient if she was symptomatic she needed to go to ED for evaluation and not to drive herself. Nothing viewable in [employer] at this time, called to follow up. [employer] stated patient has had several recordings this morning and they were all stable, sinus rhythm. The recording at 10:49 showed SR with HR of 94. Will forward to Dr Percival Spanish for review

## 2018-11-19 NOTE — Telephone Encounter (Signed)
Pt called to clarify that she was sent the 40mg  tablets but they are impossible to break in half so pharmacy advised pt to get the 20 mg tablets back.   So please resend Rx for famotidine (PEPCID) 20 MG tablet    Brooke Turner, [place] [phone] (Phone) [phone] (Fax)

## 2018-11-19 NOTE — Telephone Encounter (Signed)
Spoke with patient and advised. She is concerned may be her blood pressure causing the feeling of near syncope/lightheadedness and wants to know Dr Cherlyn Cushing thoughts on a 24 hour blood pressure monitor. She does monitor her blood pressure at home, remains within normal limits. Will forward to Dr Percival Spanish for review

## 2018-11-19 NOTE — Discharge Instructions (Addendum)
Please follow-up with Dr. Percival Spanish your cardiologist regards your symptoms today.  Please return to the ER for new or worsening symptoms the meantime.

## 2018-11-20 ENCOUNTER — Other Ambulatory Visit: Payer: Self-pay

## 2018-11-20 MED ORDER — FAMOTIDINE 20 MG PO TABS: 20.0000 mg | ORAL_TABLET | Freq: Two times a day (BID) | ORAL | 0 refills | Status: DC

## 2018-11-20 NOTE — Telephone Encounter (Signed)
Medication has been sent.  

## 2018-11-21 NOTE — Telephone Encounter (Signed)
Call and leave a voice message (DPR) of Dr Percival Spanish recommendation.

## 2018-11-23 ENCOUNTER — Telehealth: Payer: Self-pay | Admitting: Internal Medicine

## 2018-11-23 NOTE — Telephone Encounter (Signed)
Thanks and this has been noted on paperwork; returned to [place]. Nothing more needed at this time.

## 2018-11-23 NOTE — Telephone Encounter (Signed)
Patient returned call, CB is [phone].

## 2018-11-23 NOTE — Telephone Encounter (Signed)
ATC pt, no answer. Left message for pt to call back.  

## 2018-11-23 NOTE — Telephone Encounter (Signed)
LMTCB to go over Epworth scale questions.

## 2018-11-23 NOTE — Telephone Encounter (Signed)
Spoke with pt, Ep[worth score is 15. Katie I am not sure what we are doing now but she wants to hear back from [place].

## 2018-11-27 ENCOUNTER — Ambulatory Visit: Payer: 59 | Admitting: Family Medicine

## 2018-11-28 ENCOUNTER — Telehealth: Payer: Self-pay | Admitting: Internal Medicine

## 2018-11-28 NOTE — Telephone Encounter (Signed)
Rec'd fax request from pt's insurance for past sleep studies & current opioid medications for pt.  Per Joellen Jersey, faxed previous sleep studies as well as current med list.  Rec'd fax confirm.

## 2018-11-30 ENCOUNTER — Telehealth: Payer: Self-pay | Admitting: Family Medicine

## 2018-11-30 NOTE — Telephone Encounter (Signed)
LVM for pt to call the office and reschedule their appt that was scheduled for 12/13/18 with Dr. Brigitte Pulse. Due to Dr. Brigitte Pulse being out on leave, we will need to either reschedule with a different provider or if it can wait, she may schedule in March with Dr. Brigitte Pulse. When pt calls back, please reschedule with whichever pt would like to do. Thank you!

## 2018-12-04 ENCOUNTER — Telehealth: Payer: Self-pay | Admitting: Internal Medicine

## 2018-12-04 NOTE — Telephone Encounter (Signed)
Called and spoke with patient regarding HST ordered by CY Pt states that her sleep symptoms are not every night, the symptoms are becoming bothersome she would like to have the machine for two nights in a row if CY is okay with this request. Pt requests if [facility] could schedule her HST for a Friday return on Monday back to the office. This way the pt could take the HST two nights in a row over the weekend instead of just on night. CY please advise if you are okay with this request. Thank you.  Routing message to Jane in [facility] for review.

## 2018-12-04 NOTE — Telephone Encounter (Signed)
I have already scheduled pt for 2/14 which is a Friday.  Just waiting to see what CY wants her to do about wearing it for more than 1 night.

## 2018-12-05 NOTE — Telephone Encounter (Signed)
Ok to extend extra night

## 2018-12-05 NOTE — Telephone Encounter (Signed)
I already had pt scheduled for 2/14 which is a Friday so she can use for 2 nights if CY agreed.  I called pt & left her vm making her aware CY said ok to use for 2 nights.  Told her to call me back if she has any questions but I will plan to see her on 2/14 and will return machine on 2/17.  Nothing further needed.

## 2018-12-07 ENCOUNTER — Ambulatory Visit: Payer: 59 | Admitting: Cardiology

## 2018-12-12 ENCOUNTER — Ambulatory Visit (INDEPENDENT_AMBULATORY_CARE_PROVIDER_SITE_OTHER): Payer: Managed Care, Other (non HMO) | Admitting: Adult Health

## 2018-12-12 ENCOUNTER — Encounter: Payer: Self-pay | Admitting: Adult Health

## 2018-12-12 VITALS — BP 180/94 | HR 84 | Ht 64.0 in | Wt 167.6 lb

## 2018-12-12 DIAGNOSIS — R55 Syncope and collapse: Secondary | ICD-10-CM

## 2018-12-12 DIAGNOSIS — R002 Palpitations: Secondary | ICD-10-CM | POA: Diagnosis not present

## 2018-12-12 NOTE — Patient Instructions (Signed)
Follow-Up: You will need a follow up appointment in [place] or one of the following Advanced Practice Providers on your designated Care Team:  Rosaria Ferries, PA-C  Jory Sims, DNP, AACC   Medication Instructions:  NO CHANGES- Your physician recommends that you continue on your current medications as directed. Please refer to the Current Medication list given to you today. If you need a refill on your cardiac medications before your next appointment, please call your pharmacy. Labwork: When you have labs (blood work) and your tests are completely normal, you will receive your results ONLY by [facility] (if you have MyChart) -OR- A paper copy in the mail.  At [facility], you and your health needs are our priority.  As part of our continuing mission to provide you with exceptional heart care, we have created designated Provider Care Teams.  These Care Teams include your primary Cardiologist (physician) and Advanced Practice Providers (APPs -  Physician Assistants and Nurse Practitioners) who all work together to provide you with the care you need, when you need it.  Thank you for choosing [facility] at [facility]!!

## 2018-12-12 NOTE — Progress Notes (Signed)
Cardiology Office Note   Date:  12/12/2018   ID:  Brooke Turner, DOB [birth year], MRN [id number]  PCP:  [name], MD  Cardiologist:  [facility]  Chief Complaint  Patient presents with  . Palpitations  . Hypertension     History of Present Illness: Brooke Turner is a 43 y.o. female who presents for  for ongoing assessment and management of hypertension, palpitations, and near syncopal episodes. Holter monitor was placed and read by Dr. Percival Spanish and not found to have any arrhythmias. She was recommended to have 24 hour BP monitor, or take BP when she is feeling badly.   She comes today with multiple questions, ongoing rapid HR when especially when she is sleeping awakening her at night. She is very concerned that this is life threatening. She is afraid to go to the gym and work out for fear of passing out.    She is scheduled for a sleep study per her PCP. She has been placed on carvedilol by PCP at dose of 3.125 as she as unable to tolerate metoprolol without extreme fatigue.   Past Medical History:  Diagnosis Date  . Anxiety   . Back pain   . Chest pain    a. 09/2014 Echo: EF 60-65%, no rwma, nl valves;  b. 12/2014 Neg ETT.  . Diabetes mellitus without complication ([name])   . Essential hypertension    a. 01/2016 Renal duplex: no RAS- ? renal cyst (renal u/s 4/17 - no cyst, nl study); b. 02/2016 24 hr BP Monitor: Mean SBP ~ 138 with Max of 198. Highest pressures noted in evening hours following placement of cuff.  . Fibroid uterus   . GERD (gastroesophageal reflux disease)   . Palpitations    a. 09/2014 nl event monitor.    Past Surgical History:  Procedure Laterality Date  . WISDOM TOOTH EXTRACTION       Current Outpatient Medications  Medication Sig Dispense Refill  . ALPRAZolam (NIRAVAM) 0.25 MG dissolvable tablet Take 1 tablet (0.25 mg total) by mouth at bedtime as needed for anxiety. 20 tablet 1  . blood glucose meter kit and supplies Test blood sugar  twice daily. Dx code: R73.09 1 each 0  . carvedilol (COREG) 3.125 MG tablet Take 1 tablet (3.125 mg total) by mouth 2 (two) times daily with a meal. 180 tablet 1  . famotidine (PEPCID) 20 MG tablet Take 1 tablet (20 mg total) by mouth 2 (two) times daily. 180 tablet 0  . fluticasone (FLONASE) 50 MCG/ACT nasal spray Place 2 sprays into both nostrils daily. (Patient taking differently: Place 2 sprays into both nostrils daily as needed (seasonal allergies). ) 16 g 0  . glucose blood (ONETOUCH VERIO) test strip TEST BLOOD SUGAR DAILY 25 each 10  . LORazepam (ATIVAN) 0.5 MG tablet TAKE 1 TABLET BY MOUTH EVERY 6 HOURS AS NEEDED FOR ANXIETY (Patient taking differently: Take 0.25 mg by mouth every 6 (six) hours as needed for anxiety. ) 30 tablet 0  . metoCLOPramide (REGLAN) 5 MG tablet Take 1 tablet (5 mg total) by mouth 4 (four) times daily. 20 tablet 0  . Multiple Vitamins-Minerals (MULTIVITAMIN GUMMIES ADULT PO) Take 2 tablets by mouth daily.     . Norethindrone-Ethinyl Estradiol-Fe Biphas (LO LOESTRIN FE) 1 MG-10 MCG / 10 MCG tablet Take 1 tablet by mouth at bedtime.     [name] DELICA LANCETS 75Q MISC USE TO TEST BLOOD SUGAR DAILY 100 each 3  . OVER THE COUNTER MEDICATION  Take 1 tablet by mouth daily. Vitamin B-12 gummy supplement. 1055mg each gummy.     . Tiotropium Bromide Monohydrate (SPIRIVA RESPIMAT) 1.25 MCG/ACT AERS Inhale 2 puffs into the lungs daily. (Patient taking differently: Inhale 2 puffs into the lungs as needed (sob/wheezing). ) 1 Inhaler 0  . triamcinolone cream (KENALOG) 0.1 % Apply 1 application topically 2 (two) times daily. (Patient taking differently: Apply 1 application topically 2 (two) times daily as needed (rash/itching). ) 30 g 0   Current Facility-Administered Medications  Medication Dose Route Frequency Provider Last Rate Last Dose  . cyanocobalamin ((VITAMIN B-12)) injection 1,000 mcg  1,000 mcg Intramuscular Q30 days SShawnee Knapp MD   1,000 mcg at 10/19/18 1837     Allergies:   Penicillins; Tamiflu [oseltamivir phosphate]; Tinidazole; and Diltiazem    Social History:  The patient  reports that she quit smoking about 4 years ago. Her smoking use included cigarettes. She started smoking about 18 years ago. She has a 2.50 pack-year smoking history. She has never used smokeless tobacco. She reports that she does not drink alcohol or use drugs.   Family History:  The patient's family history includes Alzheimer's disease in her maternal grandmother; Cancer in her father; Diabetes (age of onset: 679 in her father; Heart disease in her maternal grandfather; Heart disease (age of onset: 658 in her father; Hypertension in her father; Seizures in her mother.    ROS: All other systems are reviewed and negative. Unless otherwise mentioned in H&P    PHYSICAL EXAM: VS:  BP (!) 180/94   Pulse 84   Ht _0  (1.626 m)   Wt 167 lb 9.6 oz (76 kg)   BMI 28.77 kg/m  , BMI Body mass index is 28.77 kg/m. GEN: Well nourished, well developed, in no acute distress HEENT: normal Neck: no JVD, carotid bruits, or masses Cardiac: RRR; no murmurs, rubs, or gallops,no edema  Respiratory:  Clear to auscultation bilaterally, normal work of breathing GI: soft, nontender, nondistended, + BS MS: no deformity or atrophy Skin: warm and dry, no rash Neuro:  Strength and sensation are intact Psych: euthymic mood, full affect, anxious   EKG: NSR, rate of 84 bpm.  Recent Labs: 10/08/2018: B Natriuretic Peptide 36.0 10/18/2018: Magnesium 2.1; TSH 0.933 10/30/2018: ALT 17 11/19/2018: BUN 11; Creatinine, Ser 0.95; Hemoglobin 13.8; Platelets 261; Potassium 3.7; Sodium 138    Lipid Panel    Component Value Date/Time   CHOL 172 10/25/2016 1850   TRIG 87 10/25/2016 1850   HDL 49 10/25/2016 1850   CHOLHDL 3.5 10/25/2016 1850   LDLCALC 106 (H) 10/25/2016 1850   LDLDIRECT 101 (H) 10/25/2016 1850      Wt Readings from Last 3 Encounters:  12/12/18 167 lb 9.6 oz (76 kg)   11/17/18 164 lb (74.4 kg)  10/29/18 164 lb (74.4 kg)      Other studies Reviewed: Cardiac Monitor 10/2018 NSR, sinus tach Rare atrial ectopy No significant arrhythmias.  Multiple symptoms did not correlate with arrhythmia.   Symptoms occurred during NSR and sinus tach   ASSESSMENT AND PLAN:  1.  Palpitations: Ongoing symptoms occurring frequently at night. Cardiac monitor did not reveal any significant arrhythmias . She asked me to review the results with her. I have answered multiple questions and provided explanations. She does not seemed satisfied with my answers. I am not going to change any medications at this time. Will await results of sleep study.   2.  Anxiety over Health: She has significant  anxiety about her symptoms. She is on anti-anxiety medications but does not want to take them as she is afraid she will become addicted to them. She is to see PCP for further recommendations.    Current medicines are reviewed at length with the patient today.  I have spent over 40 minutes with this patient.   Labs/ tests ordered today include: None  Phill Myron. [place] Pugh, ANP, AACC   12/12/2018 6:03 PM    [facility] [place] [address] Office [phone] Fax [phone]

## 2018-12-13 ENCOUNTER — Ambulatory Visit: Payer: 59 | Admitting: Family Medicine

## 2018-12-21 DIAGNOSIS — G4733 Obstructive sleep apnea (adult) (pediatric): Secondary | ICD-10-CM

## 2018-12-23 DIAGNOSIS — G4733 Obstructive sleep apnea (adult) (pediatric): Secondary | ICD-10-CM | POA: Diagnosis not present

## 2018-12-24 DIAGNOSIS — G4733 Obstructive sleep apnea (adult) (pediatric): Secondary | ICD-10-CM | POA: Diagnosis not present

## 2018-12-31 ENCOUNTER — Ambulatory Visit: Payer: Managed Care, Other (non HMO) | Admitting: Emergency Medicine

## 2018-12-31 ENCOUNTER — Encounter: Payer: Self-pay | Admitting: Emergency Medicine

## 2018-12-31 ENCOUNTER — Telehealth: Payer: Self-pay | Admitting: Family Medicine

## 2018-12-31 ENCOUNTER — Other Ambulatory Visit: Payer: Self-pay

## 2018-12-31 VITALS — BP 146/89 | HR 89 | Temp 98.8°F | Resp 16 | Ht 63.0 in | Wt 165.2 lb

## 2018-12-31 DIAGNOSIS — R0789 Other chest pain: Secondary | ICD-10-CM

## 2018-12-31 DIAGNOSIS — Z8659 Personal history of other mental and behavioral disorders: Secondary | ICD-10-CM | POA: Diagnosis not present

## 2018-12-31 DIAGNOSIS — F411 Generalized anxiety disorder: Secondary | ICD-10-CM | POA: Diagnosis not present

## 2018-12-31 DIAGNOSIS — Z87898 Personal history of other specified conditions: Secondary | ICD-10-CM | POA: Insufficient documentation

## 2018-12-31 DIAGNOSIS — E538 Deficiency of other specified B group vitamins: Secondary | ICD-10-CM | POA: Diagnosis not present

## 2018-12-31 DIAGNOSIS — E559 Vitamin D deficiency, unspecified: Secondary | ICD-10-CM

## 2018-12-31 MED ORDER — CYANOCOBALAMIN 1000 MCG/ML IJ SOLN
1000.0000 ug | Freq: Once | INTRAMUSCULAR | Status: AC
  Administered 2018-12-31: 1000 ug via INTRAMUSCULAR

## 2018-12-31 NOTE — Telephone Encounter (Signed)
Needs appointment for all these issues.  Saw Dr. Mitchel Honour today 2/24 and has appt with me sched 3/26.

## 2018-12-31 NOTE — Patient Instructions (Addendum)
If you have lab work done today you will be contacted with your lab results within the next 2 weeks.  If you have not heard from [place] then please contact us. The fastest way to get your results is to register for My Chart.   IF you received an x-ray today, you will receive an invoice from [facility]. Please contact [facility] at [phone] with questions or concerns regarding your invoice.   IF you received labwork today, you will receive an invoice from [facility]. Please contact [facility] at [phone] with questions or concerns regarding your invoice.   Our billing staff will not be able to assist you with questions regarding bills from these companies.  You will be contacted with the lab results as soon as they are available. The fastest way to get your results is to activate your My Chart account. Instructions are located on the last page of this paperwork. If you have not heard from [place] regarding the results in 2 weeks, please contact this office.        If you have lab work done today you will be contacted with your lab results within the next 2 weeks.  If you have not heard from [place] then please contact us. The fastest way to get your results is to register for My Chart.   IF you received an x-ray today, you will receive an invoice from [facility]. Please contact [facility] at [phone] with questions or concerns regarding your invoice.   IF you received labwork today, you will receive an invoice from [place]. Please contact [facility] at [phone] with questions or concerns regarding your invoice.   Our billing staff will not be able to assist you with questions regarding bills from these companies.  You will be contacted with the lab results as soon as they are available. The fastest way to get your results is to activate your My Chart account. Instructions are located on the last page of this paperwork. If you have not heard from [place]  regarding the results in 2 weeks, please contact this office.     Nonspecific Chest Pain Chest pain can be caused by many different conditions. Some causes of chest pain can be life-threatening. These will require treatment right away. Serious causes of chest pain include:  Heart attack.  A tear in the body's main blood vessel.  Redness and swelling (inflammation) around your heart.  Blood clot in your lungs. Other causes of chest pain may not be so serious. These include:  Heartburn.  Anxiety or stress.  Damage to bones or muscles in your chest.  Lung infections. Chest pain can feel like:  Pain or discomfort in your chest.  Crushing, pressure, aching, or squeezing pain.  Burning or tingling.  Dull or sharp pain that is worse when you move, cough, or take a deep breath.  Pain or discomfort that is also felt in your back, neck, jaw, shoulder, or arm, or pain that spreads to any of these areas. It is hard to know whether your pain is caused by something that is serious or something that is not so serious. So it is important to see your doctor right away if you have chest pain. Follow these instructions at home: Medicines  Take over-the-counter and prescription medicines only as told by your doctor.  If you were prescribed an antibiotic medicine, take it as told by your doctor. Do not stop taking the antibiotic even if you start to feel  better. Lifestyle   Rest as told by your doctor.  Do not use any products that contain nicotine or tobacco, such as cigarettes, e-cigarettes, and chewing tobacco. If you need help quitting, ask your doctor.  Do not drink alcohol.  Make lifestyle changes as told by your doctor. These may include: ? Getting regular exercise. Ask your doctor what activities are safe for you. ? Eating a heart-healthy diet. A diet and nutrition specialist (dietitian) can help you to learn healthy eating options. ? Staying at a healthy weight. ? Treating  diabetes or high blood pressure, if needed. ? Lowering your stress. Activities such as yoga and relaxation techniques can help. General instructions  Pay attention to any changes in your symptoms. Tell your doctor about them or any new symptoms.  Avoid any activities that cause chest pain.  Keep all follow-up visits as told by your doctor. This is important. You may need more testing if your chest pain does not go away. Contact a doctor if:  Your chest pain does not go away.  You feel depressed.  You have a fever. Get help right away if:  Your chest pain is worse.  You have a cough that gets worse, or you cough up blood.  You have very bad (severe) pain in your belly (abdomen).  You pass out (faint).  You have either of these for no clear reason: ? Sudden chest discomfort. ? Sudden discomfort in your arms, back, neck, or jaw.  You have shortness of breath at any time.  You suddenly start to sweat, or your skin gets clammy.  You feel sick to your stomach (nauseous).  You throw up (vomit).  You suddenly feel lightheaded or dizzy.  You feel very weak or tired.  Your heart starts to beat fast, or it feels like it is skipping beats. These symptoms may be an emergency. Do not wait to see if the symptoms will go away. Get medical help right away. Call your local emergency services (911 in the [place] Do not drive yourself to the hospital. Summary  Chest pain can be caused by many different conditions. The cause may be serious and need treatment right away. If you have chest pain, see your doctor right away.  Follow your doctor's instructions for taking medicines and making lifestyle changes.  Keep all follow-up visits as told by your doctor. This includes visits for any further testing if your chest pain does not go away.  Be sure to know the signs that show that your condition has become worse. Get help right away if you have these symptoms. This information is not  intended to replace advice given to you by your health care provider. Make sure you discuss any questions you have with your health care provider. Document Released: 04/11/2008 Document Revised: 04/26/2018 Document Reviewed: 04/26/2018 Elsevier Interactive Patient Education  2019 [employer].

## 2018-12-31 NOTE — Telephone Encounter (Signed)
Patient needs disability forms updated for this year for her panic disorder, I have completed the forms based on her last set that was completed by Dr Brigitte Pulse. I will give the forms to Dr Brigitte Pulse on 01/01/19 when she returns to the clinic to have them signed.

## 2018-12-31 NOTE — Progress Notes (Signed)
Brooke Turner 43 y.o.   Chief Complaint  Patient presents with  . Panic Attack    and per patient she also has PTSD and FMLA forms     HISTORY OF PRESENT ILLNESS: This is a 43 y.o. female patient of Dr. Brigitte Pulse, first visit with me. Patient has a history of chronic panic attacks and generalized anxiety disorder, history of PTSD, has FMLA forms that need filled out. Also complaining of intermittent episodes of short lived sharp chest pain, lasting seconds, sometimes associated with nausea but no other significant symptoms.  No radiation.  Has been seen in the emergency room for the same and referred to cardiologist.  Has been seen by cardiologist in the past but not recently.  Caren Griffins asymptomatic today. No other concerns or complaints today.  Mostly interested in her FMLA forms.  HPI   Prior to Admission medications   Medication Sig Start Date End Date Taking? Authorizing Provider  carvedilol (COREG) 3.125 MG tablet Take 1 tablet (3.125 mg total) by mouth 2 (two) times daily with a meal. 07/02/18  Yes [name], MD  famotidine (PEPCID) 20 MG tablet Take 1 tablet (20 mg total) by mouth 2 (two) times daily. 11/20/18  Yes [name], MD  fluticasone [facility]) 50 MCG/ACT nasal spray Place 2 sprays into both nostrils daily. Patient taking differently: Place 2 sprays into both nostrils daily as needed (seasonal allergies).  10/10/17  Yes Timmothy Euler, [place] D, PA-C  glucose blood (ONETOUCH VERIO) test strip TEST BLOOD SUGAR DAILY 11/16/18  Yes [name], MD  LORazepam (ATIVAN) 0.5 MG tablet TAKE 1 TABLET BY MOUTH EVERY 6 HOURS AS NEEDED FOR ANXIETY Patient taking differently: Take 0.25 mg by mouth every 6 (six) hours as needed for anxiety.  10/25/18  Yes [name], MD  Multiple Vitamins-Minerals (MULTIVITAMIN GUMMIES ADULT PO) Take 2 tablets by mouth daily.    Yes [provider]  Norethindrone-Ethinyl Estradiol-Fe Biphas (LO LOESTRIN FE) 1 MG-10 MCG / 10 MCG tablet Take 1  tablet by mouth at bedtime.    Yes [provider]  Jonetta Speak LANCETS 50Y MISC USE TO TEST BLOOD SUGAR DAILY 11/16/18  Yes [name], MD  OVER THE COUNTER MEDICATION Take 1 tablet by mouth daily. Vitamin B-12 gummy supplement. 1021mg each gummy.    Yes [provider]  ALPRAZolam (NIRAVAM) 0.25 MG dissolvable tablet Take 1 tablet (0.25 mg total) by mouth at bedtime as needed for anxiety. Patient not taking: Reported on 12/31/2018 10/01/18   McVey, EGelene Mink PA-C  blood glucose meter kit and supplies Test blood sugar twice daily. Dx code: R73.09 07/22/15   SShawnee Knapp MD  metoCLOPramide (REGLAN) 5 MG tablet Take 1 tablet (5 mg total) by mouth 4 (four) times daily. 10/19/18   SShawnee Knapp MD  Tiotropium Bromide Monohydrate (SPIRIVA RESPIMAT) 1.25 MCG/ACT AERS Inhale 2 puffs into the lungs daily. Patient not taking: Reported on 12/31/2018 10/25/18   YBaird LyonsD, MD  triamcinolone cream (KENALOG) 0.1 % Apply 1 application topically 2 (two) times daily. Patient not taking: Reported on 12/31/2018 08/06/18   HPosey Boyer MD    Allergies  Allergen Reactions  . Penicillins Hives    Has patient had a PCN reaction causing immediate rash, facial/tongue/throat swelling, SOB or lightheadedness with hypotension:Yes Has patient had a PCN reaction causing severe rash involving mucus membranes or skin necrosis:unsure Has patient had a PCN reaction that required hospitalization: Yes Has patient had a  PCN reaction occurring within the last 10 years: No If all of the above answers are "NO", then may proceed with Cephalosporin use.     . Tamiflu [Oseltamivir Phosphate] Nausea And Vomiting    Excessive vomiting  . Tinidazole     Caused severe abd pain/type of colitis   . Diltiazem     Dizzy, felt like she was going to pass out Blood pressure went up per patient     Patient Active Problem List   Diagnosis Date Noted  . Palpitations   . Chest pain   . Chronic  tension-type headache, not intractable 06/10/2015  . Post-traumatic stress reaction 02/25/2015  . Anxiety disorder due to general medical condition with panic attack 02/25/2015  . Subserous leiomyoma of uterus 02/25/2015  . Anxiety about health 02/25/2015  . Deviated nasal septum 12/30/2014  . Chronic rhinitis 12/30/2014  . Chronic maxillary sinusitis 12/30/2014  . Snorings 09/30/2014  . Prediabetes 08/26/2014  . Acanthosis nigricans 03/19/2014  . Vitamin D deficiency 07/15/2013  . Large breasts 09/12/2012  . Essential hypertension 02/17/2012  . Obesity (BMI 30-39.9) previous BMI was over 40. Patient has lost 70 pounds in the last year 02/17/2012  . Anxiety disorder 02/17/2012  . Chronic back pain 02/17/2012    Past Medical History:  Diagnosis Date  . Anxiety   . Back pain   . Chest pain    a. 09/2014 Echo: EF 60-65%, no rwma, nl valves;  b. 12/2014 Neg ETT.  . Diabetes mellitus without complication ([place])   . Essential hypertension    a. 01/2016 Renal duplex: no RAS- ? renal cyst (renal u/s 4/17 - no cyst, nl study); b. 02/2016 24 hr BP Monitor: Mean SBP ~ 138 with Max of 198. Highest pressures noted in evening hours following placement of cuff.  . Fibroid uterus   . GERD (gastroesophageal reflux disease)   . Palpitations    a. 09/2014 nl event monitor.    Past Surgical History:  Procedure Laterality Date  . WISDOM TOOTH EXTRACTION      Social History   Socioeconomic History  . Marital status: Single    Spouse name: N/A  . Number of children: 0  . Years of education: 83  . Highest education level: Not on file  Occupational History  . Occupation: Research scientist (physical sciences): [employer]  Social Needs  . Financial resource strain: Not on file  . Food insecurity:    Worry: Not on file    Inability: Not on file  . Transportation needs:    Medical: Not on file    Non-medical: Not on file  Tobacco Use  . Smoking status: Former Smoker    Packs/day: 0.25     Years: 10.00    Pack years: 2.50    Types: Cigarettes    Start date: 06/25/2000    Last attempt to quit: 08/19/2014    Years since quitting: 4.3  . Smokeless tobacco: Never Used  Substance and Sexual Activity  . Alcohol use: No    Alcohol/week: 0.0 standard drinks  . Drug use: No  . Sexual activity: Not Currently    Partners: Male    Birth control/protection: Pill  Lifestyle  . Physical activity:    Days per week: Not on file    Minutes per session: Not on file  . Stress: Not on file  Relationships  . Social connections:    Talks on phone: Not on file    Gets together: Not on  file    Attends religious service: Not on file    Active member of club or organization: Not on file    Attends meetings of clubs or organizations: Not on file    Relationship status: Not on file  . Intimate partner violence:    Fear of current or ex partner: Not on file    Emotionally abused: Not on file    Physically abused: Not on file    Forced sexual activity: Not on file  Other Topics Concern  . Not on file  Social History Narrative   Patient lives at home alone .   Patient works full time at [employer]   Education college.   Right handed.   Caffeine none    Family History  Problem Relation Age of Onset  . Seizures Mother   . Cancer Father        Liver  . Diabetes Father 34  . Hypertension Father   . Heart disease Father 67       CEA, LE [place]  . Alzheimer's disease Maternal Grandmother   . Heart disease Maternal Grandfather      Review of Systems  Constitutional: Negative.  Negative for chills and fever.  Eyes: Negative.   Respiratory: Negative.   Cardiovascular: Positive for chest pain and palpitations.  Gastrointestinal: Negative.  Negative for abdominal pain, blood in stool, diarrhea, nausea and vomiting.  Genitourinary: Negative.  Negative for dysuria and hematuria.  Musculoskeletal: Negative.   Skin: Negative.  Negative for rash.  Neurological: Negative for dizziness,  sensory change, weakness and headaches.  All other systems reviewed and are negative.   Vitals:   12/31/18 0906  BP: (!) 146/89  Pulse: 89  Resp: 16  Temp: 98.8 F (37.1 C)  SpO2: 100%    Physical Exam Vitals signs reviewed.  Constitutional:      Appearance: Normal appearance.  HENT:     Head: Normocephalic and atraumatic.  Eyes:     Extraocular Movements: Extraocular movements intact.     Pupils: Pupils are equal, round, and reactive to light.  Neck:     Musculoskeletal: Normal range of motion and neck supple.  Cardiovascular:     Rate and Rhythm: Normal rate.     Heart sounds: Normal heart sounds.  Pulmonary:     Effort: Pulmonary effort is normal.     Breath sounds: Normal breath sounds.  Musculoskeletal: Normal range of motion.  Skin:    General: Skin is warm and dry.     Capillary Refill: Capillary refill takes less than 2 seconds.  Neurological:     General: No focal deficit present.     Mental Status: She is alert and oriented to person, place, and time.  Psychiatric:        Mood and Affect: Mood normal.        Behavior: Behavior normal.      ASSESSMENT & PLAN: Akiyah was seen today for panic attack.  Diagnoses and all orders for this visit:  Generalized anxiety disorder  Atypical chest pain  History of posttraumatic stress disorder (PTSD)  History of panic attacks  Vitamin D deficiency -     cyanocobalamin ((VITAMIN B-12)) injection 1,000 mcg  FMLA papers should be reviewed and completed by patient's PCP, Dr. Brigitte Pulse.  Patient Instructions       If you have lab work done today you will be contacted with your lab results within the next 2 weeks.  If you have not heard from [place] then  please contact us. The fastest way to get your results is to register for My Chart.   IF you received an x-ray today, you will receive an invoice from [facility]. Please contact [facility] at [phone] with questions or concerns regarding your  invoice.   IF you received labwork today, you will receive an invoice from [facility]. Please contact [facility] at [phone] with questions or concerns regarding your invoice.   Our billing staff will not be able to assist you with questions regarding bills from these companies.  You will be contacted with the lab results as soon as they are available. The fastest way to get your results is to activate your My Chart account. Instructions are located on the last page of this paperwork. If you have not heard from [place] regarding the results in 2 weeks, please contact this office.        If you have lab work done today you will be contacted with your lab results within the next 2 weeks.  If you have not heard from [place] then please contact us. The fastest way to get your results is to register for My Chart.   IF you received an x-ray today, you will receive an invoice from [facility]. Please contact [facility] at [phone] with questions or concerns regarding your invoice.   IF you received labwork today, you will receive an invoice from [facility]. Please contact [facility] at [phone] with questions or concerns regarding your invoice.   Our billing staff will not be able to assist you with questions regarding bills from these companies.  You will be contacted with the lab results as soon as they are available. The fastest way to get your results is to activate your My Chart account. Instructions are located on the last page of this paperwork. If you have not heard from [place] regarding the results in 2 weeks, please contact this office.     Nonspecific Chest Pain Chest pain can be caused by many different conditions. Some causes of chest pain can be life-threatening. These will require treatment right away. Serious causes of chest pain include:  Heart attack.  A tear in the body's main blood vessel.  Redness and swelling (inflammation) around your heart.  Blood clot in  your lungs. Other causes of chest pain may not be so serious. These include:  Heartburn.  Anxiety or stress.  Damage to bones or muscles in your chest.  Lung infections. Chest pain can feel like:  Pain or discomfort in your chest.  Crushing, pressure, aching, or squeezing pain.  Burning or tingling.  Dull or sharp pain that is worse when you move, cough, or take a deep breath.  Pain or discomfort that is also felt in your back, neck, jaw, shoulder, or arm, or pain that spreads to any of these areas. It is hard to know whether your pain is caused by something that is serious or something that is not so serious. So it is important to see your doctor right away if you have chest pain. Follow these instructions at home: Medicines  Take over-the-counter and prescription medicines only as told by your doctor.  If you were prescribed an antibiotic medicine, take it as told by your doctor. Do not stop taking the antibiotic even if you start to feel better. Lifestyle   Rest as told by your doctor.  Do not use any products that contain nicotine or tobacco, such as cigarettes, e-cigarettes, and chewing tobacco. If you need  help quitting, ask your doctor.  Do not drink alcohol.  Make lifestyle changes as told by your doctor. These may include: ? Getting regular exercise. Ask your doctor what activities are safe for you. ? Eating a heart-healthy diet. A diet and nutrition specialist (dietitian) can help you to learn healthy eating options. ? Staying at a healthy weight. ? Treating diabetes or high blood pressure, if needed. ? Lowering your stress. Activities such as yoga and relaxation techniques can help. General instructions  Pay attention to any changes in your symptoms. Tell your doctor about them or any new symptoms.  Avoid any activities that cause chest pain.  Keep all follow-up visits as told by your doctor. This is important. You may need more testing if your chest pain  does not go away. Contact a doctor if:  Your chest pain does not go away.  You feel depressed.  You have a fever. Get help right away if:  Your chest pain is worse.  You have a cough that gets worse, or you cough up blood.  You have very bad (severe) pain in your belly (abdomen).  You pass out (faint).  You have either of these for no clear reason: ? Sudden chest discomfort. ? Sudden discomfort in your arms, back, neck, or jaw.  You have shortness of breath at any time.  You suddenly start to sweat, or your skin gets clammy.  You feel sick to your stomach (nauseous).  You throw up (vomit).  You suddenly feel lightheaded or dizzy.  You feel very weak or tired.  Your heart starts to beat fast, or it feels like it is skipping beats. These symptoms may be an emergency. Do not wait to see if the symptoms will go away. Get medical help right away. Call your local emergency services (911 in the [place] Do not drive yourself to the hospital. Summary  Chest pain can be caused by many different conditions. The cause may be serious and need treatment right away. If you have chest pain, see your doctor right away.  Follow your doctor's instructions for taking medicines and making lifestyle changes.  Keep all follow-up visits as told by your doctor. This includes visits for any further testing if your chest pain does not go away.  Be sure to know the signs that show that your condition has become worse. Get help right away if you have these symptoms. This information is not intended to replace advice given to you by your health care provider. Make sure you discuss any questions you have with your health care provider. Document Released: 04/11/2008 Document Revised: 04/26/2018 Document Reviewed: 04/26/2018 Elsevier Interactive Patient Education  2019 Elsevier Inc.      Agustina Caroli, MD [facility]

## 2018-12-31 NOTE — Telephone Encounter (Signed)
done

## 2019-01-02 NOTE — Telephone Encounter (Signed)
FORMS COMPLETED AND FAXED ON 01/02/19

## 2019-01-07 ENCOUNTER — Telehealth: Payer: Self-pay | Admitting: Family Medicine

## 2019-01-07 NOTE — Telephone Encounter (Signed)
mychart message sent to pt about their appointment with Dr Shaw °

## 2019-01-08 NOTE — Telephone Encounter (Signed)
Left message to come pick up papers they are ready at front desk.

## 2019-01-08 NOTE — Telephone Encounter (Signed)
Patient would like her disability forms copied so she can pick them up. Please call when ready to be picked up.

## 2019-01-09 ENCOUNTER — Telehealth: Payer: Self-pay | Admitting: Internal Medicine

## 2019-01-09 NOTE — Telephone Encounter (Signed)
Called and spoke with Patient. Patient was requesting her home sleep results.  Patient stated that she is concerned if she done it correctly.  Patient stated that she tried it on a Friday, and woke multiple times, and was very anxious.  Patient stated that she tried it Saturday and Sunday nights, and doesn't feel like she done any better. She is questioning having a in lab sleep study.  Message routed to Dr Annamaria Boots, to advise on sleep study  Allergies  Allergen Reactions  . Penicillins Hives    Has patient had a PCN reaction causing immediate rash, facial/tongue/throat swelling, SOB or lightheadedness with hypotension:Yes Has patient had a PCN reaction causing severe rash involving mucus membranes or skin necrosis:unsure Has patient had a PCN reaction that required hospitalization: Yes Has patient had a PCN reaction occurring within the last 10 years: No If all of the above answers are "NO", then may proceed with Cephalosporin use.     . Tamiflu [Oseltamivir Phosphate] Nausea And Vomiting    Excessive vomiting  . Tinidazole     Caused severe abd pain/type of colitis   . Diltiazem     Dizzy, felt like she was going to pass out Blood pressure went up per patient    Current Outpatient Medications on File Prior to Visit  Medication Sig Dispense Refill  . ALPRAZolam (NIRAVAM) 0.25 MG dissolvable tablet Take 1 tablet (0.25 mg total) by mouth at bedtime as needed for anxiety. (Patient not taking: Reported on 12/31/2018) 20 tablet 1  . blood glucose meter kit and supplies Test blood sugar twice daily. Dx code: R73.09 1 each 0  . carvedilol (COREG) 3.125 MG tablet Take 1 tablet (3.125 mg total) by mouth 2 (two) times daily with a meal. 180 tablet 1  . famotidine (PEPCID) 20 MG tablet Take 1 tablet (20 mg total) by mouth 2 (two) times daily. 180 tablet 0  . fluticasone (FLONASE) 50 MCG/ACT nasal spray Place 2 sprays into both nostrils daily. (Patient taking differently: Place 2 sprays into both  nostrils daily as needed (seasonal allergies). ) 16 g 0  . glucose blood (ONETOUCH VERIO) test strip TEST BLOOD SUGAR DAILY 25 each 10  . LORazepam (ATIVAN) 0.5 MG tablet TAKE 1 TABLET BY MOUTH EVERY 6 HOURS AS NEEDED FOR ANXIETY (Patient taking differently: Take 0.25 mg by mouth every 6 (six) hours as needed for anxiety. ) 30 tablet 0  . metoCLOPramide (REGLAN) 5 MG tablet Take 1 tablet (5 mg total) by mouth 4 (four) times daily. 20 tablet 0  . Multiple Vitamins-Minerals (MULTIVITAMIN GUMMIES ADULT PO) Take 2 tablets by mouth daily.     . Norethindrone-Ethinyl Estradiol-Fe Biphas (LO LOESTRIN FE) 1 MG-10 MCG / 10 MCG tablet Take 1 tablet by mouth at bedtime.     [name] DELICA LANCETS 81O MISC USE TO TEST BLOOD SUGAR DAILY 100 each 3  . OVER THE COUNTER MEDICATION Take 1 tablet by mouth daily. Vitamin B-12 gummy supplement. 1029mg each gummy.     . Tiotropium Bromide Monohydrate (SPIRIVA RESPIMAT) 1.25 MCG/ACT AERS Inhale 2 puffs into the lungs daily. (Patient not taking: Reported on 12/31/2018) 1 Inhaler 0  . triamcinolone cream (KENALOG) 0.1 % Apply 1 application topically 2 (two) times daily. (Patient not taking: Reported on 12/31/2018) 30 g 0   Current Facility-Administered Medications on File Prior to Visit  Medication Dose Route Frequency Provider Last Rate Last Dose  . cyanocobalamin ((VITAMIN B-12)) injection 1,000 mcg  1,000 mcg Intramuscular Q30 days  [name], MD   1,000 mcg at 10/19/18 0459

## 2019-01-10 ENCOUNTER — Emergency Department (HOSPITAL_COMMUNITY)
Admission: EM | Admit: 2019-01-10 | Discharge: 2019-01-10 | Disposition: A | Discharge status: Home / Self Care | Payer: Managed Care, Other (non HMO) | Attending: Emergency Medicine | Admitting: Emergency Medicine

## 2019-01-10 ENCOUNTER — Emergency Department (HOSPITAL_COMMUNITY): Payer: Managed Care, Other (non HMO)

## 2019-01-10 ENCOUNTER — Encounter (HOSPITAL_COMMUNITY): Payer: Self-pay | Admitting: *Deleted

## 2019-01-10 ENCOUNTER — Other Ambulatory Visit: Payer: Self-pay

## 2019-01-10 DIAGNOSIS — M79606 Pain in leg, unspecified: Secondary | ICD-10-CM

## 2019-01-10 DIAGNOSIS — R079 Chest pain, unspecified: Secondary | ICD-10-CM | POA: Insufficient documentation

## 2019-01-10 DIAGNOSIS — I10 Essential (primary) hypertension: Secondary | ICD-10-CM | POA: Insufficient documentation

## 2019-01-10 DIAGNOSIS — R0602 Shortness of breath: Secondary | ICD-10-CM | POA: Diagnosis present

## 2019-01-10 DIAGNOSIS — R0789 Other chest pain: Secondary | ICD-10-CM

## 2019-01-10 DIAGNOSIS — Z87891 Personal history of nicotine dependence: Secondary | ICD-10-CM | POA: Diagnosis not present

## 2019-01-10 DIAGNOSIS — Z79899 Other long term (current) drug therapy: Secondary | ICD-10-CM | POA: Diagnosis not present

## 2019-01-10 DIAGNOSIS — R0600 Dyspnea, unspecified: Secondary | ICD-10-CM | POA: Diagnosis not present

## 2019-01-10 LAB — CBC
HCT: 39.7 % (ref 36.0–46.0)
Hemoglobin: 13.3 g/dL (ref 12.0–15.0)
MCH: 29.9 pg (ref 26.0–34.0)
MCHC: 33.5 g/dL (ref 30.0–36.0)
MCV: 89.2 fL (ref 80.0–100.0)
Platelets: 257 10*3/uL (ref 150–400)
RBC: 4.45 MIL/uL (ref 3.87–5.11)
RDW: 12.3 % (ref 11.5–15.5)
WBC: 6.8 10*3/uL (ref 4.0–10.5)
nRBC: 0 % (ref 0.0–0.2)

## 2019-01-10 LAB — BASIC METABOLIC PANEL
Anion gap: 3 — ABNORMAL LOW (ref 5–15)
BUN: 16 mg/dL (ref 6–20)
CO2: 21 mmol/L — ABNORMAL LOW (ref 22–32)
Calcium: 8 mg/dL — ABNORMAL LOW (ref 8.9–10.3)
Chloride: 110 mmol/L (ref 98–111)
Creatinine, Ser: 1.1 mg/dL — ABNORMAL HIGH (ref 0.44–1.00)
GFR calc Af Amer: >60 mL/min (ref >60)
GFR calc non Af Amer: >60 mL/min (ref >60)
Glucose, Bld: 98 mg/dL (ref 70–99)
Potassium: 3.8 mmol/L (ref 3.5–5.1)
Sodium: 134 mmol/L — ABNORMAL LOW (ref 135–145)

## 2019-01-10 LAB — D-DIMER, QUANTITATIVE: D-Dimer, Quant: 0.27 ug/mL-FEU (ref 0.00–0.50)

## 2019-01-10 LAB — I-STAT BETA HCG BLOOD, ED (MC, WL, AP ONLY): I-stat hCG, quantitative: <5 m[IU]/mL (ref <5)

## 2019-01-10 LAB — I-STAT TROPONIN, ED
Troponin i, poc: 0 ng/mL (ref 0.00–0.08)
Troponin i, poc: 0 ng/mL (ref 0.00–0.08)

## 2019-01-10 MED ORDER — SODIUM CHLORIDE 0.9% FLUSH: 3.0000 mL | Freq: Once | INTRAVENOUS | Status: DC

## 2019-01-10 NOTE — ED Triage Notes (Addendum)
CP started 1 hr. Ago a burning pain. Pt also had a burning pain in Lt thigh up to Lt shoulder. Pt also reports SHOB with ambulation to her car.Pt reports swelling to Lt leg above the knee

## 2019-01-10 NOTE — Telephone Encounter (Signed)
Home sleep test- was very normal. Only a couple of events, which is well within normal limits. Plenty of recording time. Normal heart rate and oxygen levels. There would be no reason to do another study.

## 2019-01-10 NOTE — Telephone Encounter (Signed)
Called the patient and advised her of the results noted below from Dr. Annamaria Boots.  Patient stated she tried to use the device Friday night, thinking she would get the machine to record more information, but was not able to sleep for the first 5 - 8 hours, then finally slept for 3. Was looking at the machine during the night and a yellow light was blinking, thought the machine was not working. Tried the device again on Saturday and Sunday night and was able finally able to sleep.   Read the results Dr. Annamaria Boots noted again and advised her that machine would record how much sleep occurred over Saturday and Sunday night.  Patient stated cardiology told her it was sleep apnea. Still having issues with her heart and does not understand what is going on and why. Patient denied any anxiety or stressful situations that could be the cause. Message routed to Dr. Annamaria Boots to review and advise (if any recommendations).  Dr. Annamaria Boots, please see that above and give recommendations if any. Thank you much.

## 2019-01-10 NOTE — Telephone Encounter (Signed)
Pt is calling back [phone] 

## 2019-01-10 NOTE — Telephone Encounter (Signed)
LVMTCB x 1 for patient. 

## 2019-01-10 NOTE — Discharge Instructions (Addendum)
We saw you in the ER for the leg swelling and discomfort in the chest and leg. All of our cardiac workup is normal, including labs, EKG. We also screened you for a blood clot with a test called d-dimer -this test is normal therefore we do not think you have a blood clot in your leg or your lung at this time.  We are not sure what is causing your discomfort, but we feel comfortable sending you home at this time. The workup in the ER is not complete, and you should follow up with your primary care doctor for further evaluation.

## 2019-01-10 NOTE — ED Provider Notes (Signed)
[facility] EMERGENCY DEPARTMENT Provider Note   CSN: [id number] Arrival date & time: 01/10/19  1752    History   Chief Complaint No chief complaint on file.   HPI Brooke Turner is a 43 y.o. female.     HPI  43 year old female with history of diabetes, hypertension, chest pain comes in with chief complaint of chest discomfort and shortness of breath.  Patient reports that while at work she had the sudden episode of pain in her left thigh along with some left-sided chest discomfort.  Her symptoms have now pretty much resolved.  She is also complaining of swelling over the left thigh over the past 1 to 2 months.  Additionally she reports that with certain positions, especially sitting on a toilet she will start having tingling that resolves on its own.  Patient takes OCPs. Pt has no hx of PE, DVT and denies any exogenous hormone (testosterone / estrogen) use, long distance travels or surgery in the past 6 weeks, active cancer, recent immobilization. She denies any smoking or drug use.  Past Medical History:  Diagnosis Date  . Anxiety   . Back pain   . Chest pain    a. 09/2014 Echo: EF 60-65%, no rwma, nl valves;  b. 12/2014 Neg ETT.  . Diabetes mellitus without complication ([place])   . Essential hypertension    a. 01/2016 Renal duplex: no RAS- ? renal cyst (renal u/s 4/17 - no cyst, nl study); b. 02/2016 24 hr BP Monitor: Mean SBP ~ 138 with Max of 198. Highest pressures noted in evening hours following placement of cuff.  . Fibroid uterus   . GERD (gastroesophageal reflux disease)   . Palpitations    a. 09/2014 nl event monitor.    Patient Active Problem List   Diagnosis Date Noted  . History of posttraumatic stress disorder (PTSD) 12/31/2018  . History of panic attacks 12/31/2018  . Palpitations   . Atypical chest pain   . Chronic tension-type headache, not intractable 06/10/2015  . Post-traumatic stress reaction 02/25/2015  . Anxiety disorder  due to general medical condition with panic attack 02/25/2015  . Subserous leiomyoma of uterus 02/25/2015  . Anxiety about health 02/25/2015  . Deviated nasal septum 12/30/2014  . Chronic rhinitis 12/30/2014  . Chronic maxillary sinusitis 12/30/2014  . Snorings 09/30/2014  . Prediabetes 08/26/2014  . Acanthosis nigricans 03/19/2014  . Vitamin D deficiency 07/15/2013  . Large breasts 09/12/2012  . Essential hypertension 02/17/2012  . Obesity (BMI 30-39.9) previous BMI was over 40. Patient has lost 70 pounds in the last year 02/17/2012  . Anxiety disorder 02/17/2012  . Chronic back pain 02/17/2012    Past Surgical History:  Procedure Laterality Date  . WISDOM TOOTH EXTRACTION       OB History    Gravida  2   Para  0   Term  0   Preterm  0   AB  2   Living  0     SAB  1   TAB  1   Ectopic  0   Multiple  0   Live Births               Home Medications    Prior to Admission medications   Medication Sig Start Date End Date Taking? Authorizing Provider  ALPRAZolam (NIRAVAM) 0.25 MG dissolvable tablet Take 1 tablet (0.25 mg total) by mouth at bedtime as needed for anxiety. Patient not taking: Reported on 12/31/2018 10/01/18  McVey, Gelene Mink, PA-C  blood glucose meter kit and supplies Test blood sugar twice daily. Dx code: R73.09 07/22/15   [name], MD  carvedilol (COREG) 3.125 MG tablet Take 1 tablet (3.125 mg total) by mouth 2 (two) times daily with a meal. 07/02/18   [name], MD  famotidine (PEPCID) 20 MG tablet Take 1 tablet (20 mg total) by mouth 2 (two) times daily. 11/20/18   [name], MD  fluticasone (FLONASE) 50 MCG/ACT nasal spray Place 2 sprays into both nostrils daily. Patient taking differently: Place 2 sprays into both nostrils daily as needed (seasonal allergies).  10/10/17   Tenna Delaine D, PA-C  glucose blood (ONETOUCH VERIO) test strip TEST BLOOD SUGAR DAILY 11/16/18   [name], MD  LORazepam (ATIVAN) 0.5 MG tablet TAKE 1  TABLET BY MOUTH EVERY 6 HOURS AS NEEDED FOR ANXIETY Patient taking differently: Take 0.25 mg by mouth every 6 (six) hours as needed for anxiety.  10/25/18   [name], MD  metoCLOPramide (REGLAN) 5 MG tablet Take 1 tablet (5 mg total) by mouth 4 (four) times daily. 10/19/18   [name], MD  Multiple Vitamins-Minerals (MULTIVITAMIN GUMMIES ADULT PO) Take 2 tablets by mouth daily.     [provider]  Norethindrone-Ethinyl Estradiol-Fe Biphas (LO LOESTRIN FE) 1 MG-10 MCG / 10 MCG tablet Take 1 tablet by mouth at bedtime.     [provider]  Jonetta Speak LANCETS 62U MISC USE TO TEST BLOOD SUGAR DAILY 11/16/18   [name], MD  OVER THE COUNTER MEDICATION Take 1 tablet by mouth daily. Vitamin B-12 gummy supplement. 1049mg each gummy.     [provider]  Tiotropium Bromide Monohydrate (SPIRIVA RESPIMAT) 1.25 MCG/ACT AERS Inhale 2 puffs into the lungs daily. Patient not taking: Reported on 12/31/2018 10/25/18   YBaird LyonsD, MD  triamcinolone cream (KENALOG) 0.1 % Apply 1 application topically 2 (two) times daily. Patient not taking: Reported on 12/31/2018 08/06/18   HPosey Boyer MD    Family History Family History  Problem Relation Age of Onset  . Seizures Mother   . Cancer Father        Liver  . Diabetes Father 645 . Hypertension Father   . Heart disease Father 646      CEA, LE Stenting  . Alzheimer's disease Maternal Grandmother   . Heart disease Maternal Grandfather     Social History Social History   Tobacco Use  . Smoking status: Former Smoker    Packs/day: 0.25    Years: 10.00    Pack years: 2.50    Types: Cigarettes    Start date: 06/25/2000    Last attempt to quit: 08/19/2014    Years since quitting: 4.3  . Smokeless tobacco: Never Used  Substance Use Topics  . Alcohol use: No    Alcohol/week: 0.0 standard drinks  . Drug use: No     Allergies   Penicillins; Tamiflu [oseltamivir phosphate]; Tinidazole; and  Diltiazem   Review of Systems Review of Systems  Constitutional: Positive for activity change.  Respiratory: Positive for shortness of breath.   Cardiovascular: Positive for chest pain.  Skin: Negative for rash.  Allergic/Immunologic: Negative for immunocompromised state.  Hematological: Does not bruise/bleed easily.  All other systems reviewed and are negative.    Physical Exam Updated Vital Signs BP (!) 143/86   Pulse 73   Temp 98.7 F (37.1 C) (Oral)   Resp 18  Ht _0  (1.6 m)   Wt 74.4 kg   LMP 03/11/2018 Comment: Pt uses Birth control per her MD  SpO2 100%   BMI 29.05 kg/m   Physical Exam Vitals signs and nursing note reviewed.  Constitutional:      Appearance: She is well-developed.  HENT:     Head: Normocephalic and atraumatic.  Neck:     Musculoskeletal: Normal range of motion and neck supple.  Cardiovascular:     Rate and Rhythm: Normal rate.  Pulmonary:     Effort: Pulmonary effort is normal.  Abdominal:     General: Bowel sounds are normal.  Musculoskeletal:        General: No swelling or tenderness.     Right lower leg: No edema.     Left lower leg: No edema.  Skin:    General: Skin is warm and dry.  Neurological:     Mental Status: She is alert and oriented to person, place, and time.      ED Treatments / Results  Labs (all labs ordered are listed, but only abnormal results are displayed) Labs Reviewed  BASIC METABOLIC PANEL - Abnormal; Notable for the following components:      Result Value   Sodium 134 (*)    CO2 21 (*)    Creatinine, Ser 1.10 (*)    Calcium 8.0 (*)    Anion gap 3 (*)    All other components within normal limits  CBC  D-DIMER, QUANTITATIVE (NOT AT [facility])  I-STAT TROPONIN, ED  I-STAT BETA HCG BLOOD, ED ([facility], [facility], [facility] ONLY)  I-STAT TROPONIN, ED    EKG EKG Interpretation  Date/Time:  Thursday January 10 2019 17:56:13 EST Ventricular Rate:  85 PR Interval:  150 QRS Duration: 80 QT Interval:  348 QTC  Calculation: 414 R Axis:   45 Text Interpretation:  Normal sinus rhythm Normal ECG No acute changes No significant change since last tracing Confirmed by Varney Biles [phone]) on 01/10/2019 7:25:46 PM   Radiology Dg Chest 2 View  Result Date: 01/10/2019 CLINICAL DATA:  Pt states CP started 1 hr ago after having a burning pain in Lt thigh up to Lt shoulder. Pt also reports SHOB with ambulation. Pt reports swelling to Lt leg above the knee. Hx of palpitations, diabetes and HTN. Denies any other symptoms. EXAM: CHEST - 2 VIEW COMPARISON:  11/19/2018 FINDINGS: The heart size and mediastinal contours are within normal limits. Both lungs are clear. No pleural effusion or pneumothorax. The visualized skeletal structures are unremarkable. IMPRESSION: No active cardiopulmonary disease. Electronically Signed   By: Lajean Manes M.D.   On: 01/10/2019 19:05    Procedures Procedures (including critical care time)  Medications Ordered in ED Medications  sodium chloride flush (NS) 0.9 % injection 3 mL (3 mLs Intravenous Not Given 01/10/19 1924)     Initial Impression / Assessment and Plan / ED Course  I have reviewed the triage vital signs and the nursing notes.  Pertinent labs & imaging results that were available during my care of the patient were reviewed by me and considered in my medical decision making (see chart for details).        43 year old female comes in a chief complaint of chest discomfort, left lower extremity pain and shortness of breath.  She does not have any significant medical history. EKG is reassuring.  At the moment patient is symptom-free.  She has had previous visits for chest discomfort and shortness of breath, and has had  2 CT PEs that were negative in the last 4 months.  Clinical concerns for PE is extremely low at this time.  Patient is noted to have no hypoxia, tachypnea, tachycardia and EKG is not showing any right-sided strain.  She is taking blood thinners, therefore she  is not PERC negative -however pretest probability for PE is extremely low and we do not think we need to work her up for that specific condition.  She is complaining of some left leg discomfort and swelling.  Her Wells score is 0 for DVT, we will get d-dimer, which if negative then we will not have to pursue ultrasound.  Additionally patient also get delta troponins.  Reassessment: Results from the ER workup discussed with the patient face to face and all questions answered to the best of my ability.   Final Clinical Impressions(s) / ED Diagnoses   Final diagnoses:  Dyspnea, unspecified type  Chest discomfort  Pain of lower extremity, unspecified laterality    ED Discharge Orders    None       Varney Biles, MD 01/10/19 2323

## 2019-01-15 NOTE — Telephone Encounter (Signed)
No indication for repeat sleep study at this time.

## 2019-01-15 NOTE — Telephone Encounter (Signed)
Tried to contact patient, unable to reach. LMTCB.   Dr. Annamaria Boots please advise, thank you.

## 2019-01-17 NOTE — Telephone Encounter (Signed)
Attempted to call pt but unable to reach. Left message for pt to return call. 

## 2019-01-18 NOTE — Telephone Encounter (Signed)
Left detailed msg letting her know that per Dr Annamaria Boots there is no need for a repeat sleep study

## 2019-01-18 NOTE — Telephone Encounter (Signed)
Left message for patient to call back  

## 2019-01-18 NOTE — Telephone Encounter (Signed)
Patient returning phone call.  Patient phone number is [phone].  May leave detailed message on voicemail.

## 2019-01-20 ENCOUNTER — Other Ambulatory Visit: Payer: Self-pay | Admitting: Family Medicine

## 2019-01-22 NOTE — Telephone Encounter (Signed)
Patient is requesting a refill of the following medications: Requested Prescriptions   Pending Prescriptions Disp Refills  . LORazepam (ATIVAN) 0.5 MG tablet [Pharmacy Med Name: LORazepam 0.5 MG Oral Tablet] 30 tablet 0    Sig: TAKE 1 TABLET BY MOUTH EVERY 6 HOURS AS NEEDED FOR ANXIETY    Date of patient request: 01/20/2019 Last office visit: 12/31/2018 Date of last refill: 10/25/2018 Last refill amount: 30 Follow up time period per chart:

## 2019-01-24 ENCOUNTER — Other Ambulatory Visit: Payer: Self-pay

## 2019-01-24 ENCOUNTER — Ambulatory Visit: Payer: 59 | Admitting: Internal Medicine

## 2019-01-24 ENCOUNTER — Encounter (HOSPITAL_COMMUNITY): Payer: Self-pay

## 2019-01-24 ENCOUNTER — Telehealth: Payer: Self-pay | Admitting: Cardiology

## 2019-01-24 ENCOUNTER — Emergency Department (HOSPITAL_COMMUNITY)
Admission: EM | Admit: 2019-01-24 | Discharge: 2019-01-24 | Disposition: A | Discharge status: Home / Self Care | Payer: Managed Care, Other (non HMO) | Attending: Emergency Medicine | Admitting: Emergency Medicine

## 2019-01-24 DIAGNOSIS — R0789 Other chest pain: Secondary | ICD-10-CM | POA: Insufficient documentation

## 2019-01-24 DIAGNOSIS — R002 Palpitations: Secondary | ICD-10-CM | POA: Diagnosis not present

## 2019-01-24 DIAGNOSIS — E119 Type 2 diabetes mellitus without complications: Secondary | ICD-10-CM | POA: Insufficient documentation

## 2019-01-24 DIAGNOSIS — R000 Tachycardia, unspecified: Secondary | ICD-10-CM | POA: Diagnosis present

## 2019-01-24 DIAGNOSIS — I10 Essential (primary) hypertension: Secondary | ICD-10-CM | POA: Insufficient documentation

## 2019-01-24 DIAGNOSIS — Z87891 Personal history of nicotine dependence: Secondary | ICD-10-CM | POA: Insufficient documentation

## 2019-01-24 DIAGNOSIS — Z79899 Other long term (current) drug therapy: Secondary | ICD-10-CM | POA: Diagnosis not present

## 2019-01-24 DIAGNOSIS — M79662 Pain in left lower leg: Secondary | ICD-10-CM | POA: Insufficient documentation

## 2019-01-24 LAB — CBC WITH DIFFERENTIAL/PLATELET
Abs Immature Granulocytes: 0.01 10*3/uL (ref 0.00–0.07)
Basophils Absolute: 0 10*3/uL (ref 0.0–0.1)
Basophils Relative: 0 %
Eosinophils Absolute: 0 10*3/uL (ref 0.0–0.5)
Eosinophils Relative: 1 %
HCT: 42 % (ref 36.0–46.0)
Hemoglobin: 14.2 g/dL (ref 12.0–15.0)
Immature Granulocytes: 0 %
Lymphocytes Relative: 56 %
Lymphs Abs: 4.2 10*3/uL — ABNORMAL HIGH (ref 0.7–4.0)
MCH: 30.7 pg (ref 26.0–34.0)
MCHC: 33.8 g/dL (ref 30.0–36.0)
MCV: 90.7 fL (ref 80.0–100.0)
Monocytes Absolute: 0.8 10*3/uL (ref 0.1–1.0)
Monocytes Relative: 11 %
Neutro Abs: 2.4 10*3/uL (ref 1.7–7.7)
Neutrophils Relative %: 32 %
Platelets: 271 10*3/uL (ref 150–400)
RBC: 4.63 MIL/uL (ref 3.87–5.11)
RDW: 12.4 % (ref 11.5–15.5)
WBC: 7.5 10*3/uL (ref 4.0–10.5)
nRBC: 0 % (ref 0.0–0.2)

## 2019-01-24 LAB — COMPREHENSIVE METABOLIC PANEL
ALT: 15 U/L (ref 0–44)
AST: 22 U/L (ref 15–41)
Albumin: 4.1 g/dL (ref 3.5–5.0)
Alkaline Phosphatase: 58 U/L (ref 38–126)
Anion gap: 10 (ref 5–15)
BUN: 17 mg/dL (ref 6–20)
CO2: 23 mmol/L (ref 22–32)
Calcium: 8.6 mg/dL — ABNORMAL LOW (ref 8.9–10.3)
Chloride: 105 mmol/L (ref 98–111)
Creatinine, Ser: 1.21 mg/dL — ABNORMAL HIGH (ref 0.44–1.00)
GFR calc Af Amer: >60 mL/min (ref >60)
GFR calc non Af Amer: 55 mL/min — ABNORMAL LOW (ref >60)
Glucose, Bld: 152 mg/dL — ABNORMAL HIGH (ref 70–99)
Potassium: 3.9 mmol/L (ref 3.5–5.1)
Sodium: 138 mmol/L (ref 135–145)
Total Bilirubin: 0.9 mg/dL (ref 0.3–1.2)
Total Protein: 7.6 g/dL (ref 6.5–8.1)

## 2019-01-24 LAB — D-DIMER, QUANTITATIVE: D-Dimer, Quant: 0.31 ug/mL-FEU (ref 0.00–0.50)

## 2019-01-24 MED ORDER — SODIUM CHLORIDE 0.9 % IV BOLUS
1000.0000 mL | Freq: Once | INTRAVENOUS | Status: AC
  Administered 2019-01-24: 1000 mL via INTRAVENOUS

## 2019-01-24 NOTE — ED Provider Notes (Signed)
[facility] DEPT Provider Note   CSN: [id number] Arrival date & time: 01/24/19  0057    History   Chief Complaint Chief Complaint  Patient presents with  . Chest Pain    HPI Brooke Turner is a 43 y.o. female.     Patient to ED for evaluation of elevated heart rate. She states her symptoms started while at rest, talking on the phone, no inciting factors, and reached a rate in the 140's. Her chest was tight. No cough, fever, congestion, vomiting. She has been seen for same by cardiology [facility]) and has her follow up appointment next month in his office. She states she became concerned tonight because previously she was active when symptoms started and they are lasting longer than usual tonight. Otherwise, symptoms are similar to previous episodes. She also reports left calf pain without swelling. She has also had this pain in the past and reports tests for clots have been negative. She denies caffeine use, drug or alcohol use, OTC medications. She tried taking 1/2 of an Ativan to help resolve symptoms without relief.   The history is provided by the patient. No language interpreter was used.    Past Medical History:  Diagnosis Date  . Anxiety   . Back pain   . Chest pain    a. 09/2014 Echo: EF 60-65%, no rwma, nl valves;  b. 12/2014 Neg ETT.  . Diabetes mellitus without complication ([place])   . Essential hypertension    a. 01/2016 Renal duplex: no RAS- ? renal cyst (renal u/s 4/17 - no cyst, nl study); b. 02/2016 24 hr BP Monitor: Mean SBP ~ 138 with Max of 198. Highest pressures noted in evening hours following placement of cuff.  . Fibroid uterus   . GERD (gastroesophageal reflux disease)   . Palpitations    a. 09/2014 nl event monitor.    Patient Active Problem List   Diagnosis Date Noted  . History of posttraumatic stress disorder (PTSD) 12/31/2018  . History of panic attacks 12/31/2018  . Palpitations   . Atypical chest pain   .  Chronic tension-type headache, not intractable 06/10/2015  . Post-traumatic stress reaction 02/25/2015  . Anxiety disorder due to general medical condition with panic attack 02/25/2015  . Subserous leiomyoma of uterus 02/25/2015  . Anxiety about health 02/25/2015  . Deviated nasal septum 12/30/2014  . Chronic rhinitis 12/30/2014  . Chronic maxillary sinusitis 12/30/2014  . Snorings 09/30/2014  . Prediabetes 08/26/2014  . Acanthosis nigricans 03/19/2014  . Vitamin D deficiency 07/15/2013  . Large breasts 09/12/2012  . Essential hypertension 02/17/2012  . Obesity (BMI 30-39.9) previous BMI was over 40. Patient has lost 70 pounds in the last year 02/17/2012  . Anxiety disorder 02/17/2012  . Chronic back pain 02/17/2012    Past Surgical History:  Procedure Laterality Date  . WISDOM TOOTH EXTRACTION       OB History    Gravida  2   Para  0   Term  0   Preterm  0   AB  2   Living  0     SAB  1   TAB  1   Ectopic  0   Multiple  0   Live Births               Home Medications    Prior to Admission medications   Medication Sig Start Date End Date Taking? Authorizing Provider  ALPRAZolam (NIRAVAM) 0.25 MG dissolvable tablet Take 1  tablet (0.25 mg total) by mouth at bedtime as needed for anxiety. 10/01/18  Yes McVey, Gelene Mink, PA-C  Biotin 1000 MCG CHEW Chew 1 tablet by mouth daily.   Yes [provider]  carvedilol (COREG) 3.125 MG tablet Take 1 tablet (3.125 mg total) by mouth 2 (two) times daily with a meal. 07/02/18  Yes [name], MD  famotidine (PEPCID) 20 MG tablet Take 1 tablet (20 mg total) by mouth 2 (two) times daily. 11/20/18  Yes [name], MD  fluticasone [facility]) 50 MCG/ACT nasal spray Place 2 sprays into both nostrils daily. Patient taking differently: Place 2 sprays into both nostrils daily as needed (seasonal allergies).  10/10/17  Yes Timmothy Euler, [place] D, PA-C  LORazepam (ATIVAN) 0.5 MG tablet TAKE 1 TABLET BY MOUTH EVERY 6  HOURS AS NEEDED FOR ANXIETY Patient taking differently: Take 0.25 mg by mouth every 6 (six) hours as needed for anxiety.  10/25/18  Yes [name], MD  Multiple Vitamins-Minerals (MULTIVITAMIN GUMMIES ADULT PO) Take 2 tablets by mouth daily.    Yes [provider]  Norethindrone-Ethinyl Estradiol-Fe Biphas (LO LOESTRIN FE) 1 MG-10 MCG / 10 MCG tablet Take 1 tablet by mouth at bedtime.    Yes [provider]  OVER THE COUNTER MEDICATION Take 1 tablet by mouth daily. Vitamin B-12 gummy supplement. 1043mg each gummy.    Yes [provider]  blood glucose meter kit and supplies Test blood sugar twice daily. Dx code: R73.09 07/22/15   SShawnee Knapp MD  glucose blood ([facility]) test strip TEST BLOOD SUGAR DAILY 11/16/18   SShawnee Knapp MD  [facility] LANCETS 320UMISC USE TO TEST BLOOD SUGAR DAILY 11/16/18   SShawnee Knapp MD  Tiotropium Bromide Monohydrate (SPIRIVA RESPIMAT) 1.25 MCG/ACT AERS Inhale 2 puffs into the lungs daily. Patient not taking: Reported on 12/31/2018 10/25/18   YBaird LyonsD, MD  triamcinolone cream (KENALOG) 0.1 % Apply 1 application topically 2 (two) times daily. Patient not taking: Reported on 12/31/2018 08/06/18   HPosey Boyer MD    Family History Family History  Problem Relation Age of Onset  . Seizures Mother   . Cancer Father        Liver  . Diabetes Father 613 . Hypertension Father   . Heart disease Father 677      CEA, LE Stenting  . Alzheimer's disease Maternal Grandmother   . Heart disease Maternal Grandfather     Social History Social History   Tobacco Use  . Smoking status: Former Smoker    Packs/day: 0.25    Years: 10.00    Pack years: 2.50    Types: Cigarettes    Start date: 06/25/2000    Last attempt to quit: 08/19/2014    Years since quitting: 4.4  . Smokeless tobacco: Never Used  Substance Use Topics  . Alcohol use: No    Alcohol/week: 0.0 standard drinks  . Drug use: No     Allergies   Penicillins;  Tamiflu [oseltamivir phosphate]; Tinidazole; and Diltiazem   Review of Systems Review of Systems  Constitutional: Negative for chills and fever.  HENT: Negative.   Respiratory: Positive for chest tightness. Negative for cough.   Cardiovascular: Positive for palpitations.  Gastrointestinal: Negative.  Negative for vomiting.  Musculoskeletal: Negative.  Negative for myalgias.  Skin: Negative.   Neurological: Negative.      Physical Exam Updated Vital Signs BP (!) 152/79   Pulse 93  Resp 18   Ht 5' 2.99" (1.6 m)   Wt 74.4 kg   SpO2 100%   BMI 29.06 kg/m   Physical Exam Constitutional:      Appearance: She is well-developed.  HENT:     Head: Normocephalic.  Neck:     Musculoskeletal: Normal range of motion and neck supple.  Cardiovascular:     Rate and Rhythm: Normal rate and regular rhythm.     Heart sounds: No murmur.  Pulmonary:     Effort: Pulmonary effort is normal.     Breath sounds: Normal breath sounds. No wheezing, rhonchi or rales.  Abdominal:     General: Bowel sounds are normal.     Palpations: Abdomen is soft.     Tenderness: There is no abdominal tenderness. There is no guarding or rebound.  Musculoskeletal: Normal range of motion.  Skin:    General: Skin is warm and dry.     Findings: No rash.  Neurological:     Mental Status: She is alert and oriented to person, place, and time.      ED Treatments / Results  Labs (all labs ordered are listed, but only abnormal results are displayed) Labs Reviewed  CBC WITH DIFFERENTIAL/PLATELET - Abnormal; Notable for the following components:      Result Value   Lymphs Abs 4.2 (*)    All other components within normal limits  COMPREHENSIVE METABOLIC PANEL - Abnormal; Notable for the following components:   Glucose, Bld 152 (*)    Creatinine, Ser 1.21 (*)    Calcium 8.6 (*)    GFR calc non Af Amer 55 (*)    All other components within normal limits  D-DIMER, QUANTITATIVE (NOT AT [facility])    EKG EKG  Interpretation  Date/Time:  Thursday January 24 2019 01:29:18 EDT Ventricular Rate:  86 PR Interval:  154 QRS Duration: 84 QT Interval:  348 QTC Calculation: 416 R Axis:   38 Text Interpretation:  Normal sinus rhythm Normal ECG Confirmed by Veryl Speak [phone]) on 01/24/2019 1:51:44 AM   Radiology No results found.  Procedures Procedures (including critical care time)  Medications Ordered in ED Medications  sodium chloride 0.9 % bolus 1,000 mL (1,000 mLs Intravenous New Bag/Given (Non-Interop) 01/24/19 0221)     Initial Impression / Assessment and Plan / ED Course  I have reviewed the triage vital signs and the nursing notes.  Pertinent labs & imaging results that were available during my care of the patient were reviewed by me and considered in my medical decision making (see chart for details).        Patient to ED with fast heart rate, chest tightness, similar to previous episodes. She has seen cardiology and, per notes, wore a Holter monitor that showed no arrhythmias. She is taking a small dose of carvedilol by primary care. No new symptoms of URI, illness, fever.   Heart rate in the ED has fluctuated from 90's to low 100's. EKG shows NSR. Chart reviewed. She has had multiple thyroid tests that have been normal. She has been evaluated for clots that have been negative. She is currently on exogenous hormones putting her at greater risk for PE. D-dimer done and is normal tonight. Labs otherwise unremarkable.   She is felt appropriate for discharge home. She will follow up with Dr. Percival Spanish as scheduled in office.     Final Clinical Impressions(s) / ED Diagnoses   Final diagnoses:  None   1. Palpitations  ED Discharge Orders  None       Charlann Lange, PA-C 01/24/19 0541    Veryl Speak, MD 01/24/19 [phone]

## 2019-01-24 NOTE — Telephone Encounter (Signed)
New Message:    Patient calling to see if she can get a sooner appt than 02/08/19. Patient was seen in the ER yesterday. Please call patient back.

## 2019-01-24 NOTE — Discharge Instructions (Addendum)
Follow up with Dr. Percival Spanish for further management of ongoing symptoms of fast-rate palpitations.

## 2019-01-24 NOTE — Telephone Encounter (Signed)
Left detailed message that we are not scheduling any sooner appts d/t COVID

## 2019-01-25 ENCOUNTER — Telehealth: Payer: Self-pay | Admitting: Cardiology

## 2019-01-25 NOTE — Telephone Encounter (Signed)
Left message to call back  

## 2019-01-25 NOTE — Telephone Encounter (Signed)
New message:   Patient calling to get a sooner appt/patient was also seen in ER yesterday. Patient was at work and almost past out. Please call patient concerning new appt. Sooner 02/08/19

## 2019-01-25 NOTE — Telephone Encounter (Signed)
F/U Message ° ° ° ° ° ° ° ° ° °Patient returned your call °

## 2019-01-25 NOTE — Telephone Encounter (Signed)
     Spoke with pt who states today at work she had an episodes to where she felt like she was going to pass out. She report at the time she was light headed, SOB, BP 179/114, HR 98. She also state she has been experiencing on and off chest pain for the past week. Currently denies CP.  She was recently seen in the ED last night due to palpations. Pt has an upcoming with Dr. Warren Lacy on 4/2 but requesting to be seen sooner. Per Kerin Ransom, [place], advised to add pt to APP scheduled for Monday. Appointment scheduled for 9 am with Vidal Schwalbe, DNP. Pt voiced understanding to report to ED if symptoms reoccur.   Cardiac Questionnaire:    Since your last visit or hospitalization:    1. Have you been having new or worsening chest pain? No   2. Have you been having new or worsening shortness of breath? yes 3. Have you been having new or worsening leg swelling, wt gain, or increase in abdominal girth (pants fitting more tightly)? No   4. Have you had any passing out spells? yes    *A YES to any of these questions would result in the appointment being kept. *If all the answers to these questions are NO, we should indicate that given the current situation regarding the worldwide coronarvirus pandemic, at the recommendation of the CDC, we are looking to limit gatherings in our waiting area, and thus will reschedule their appointment beyond four weeks from today.   _____________   [id number] Pre-Screening Questions:  . Do you currently have a fever? No ( . Have you recently travelled on a cruise, internationally, or to [place], [place], [place], [place], [place], or [place], [place] [employer]) ? No  Have you been in contact with someone that is currently pending confirmation of Covid19 testing or has been confirmed to have the [place] virus?  No Are you currently experiencing fatigue or cough? No

## 2019-01-27 NOTE — Progress Notes (Signed)
Cardiology Office Note   Date:  01/28/2019   ID:  Brooke Turner, DOB [birth year], MRN [id number]  PCP:  [name], MD  Cardiologist: Dr. Percival Spanish  Chief Complaint  Patient presents with  . Near Syncope  . Hypertension     History of Present Illness: Brooke Turner is a 43 y.o. female who presents for ongoing assessment and management of near syncope and chest pain, She was seen in our office last on 10/25/2018. At that time she had a Holter monitor placed. She had no significant arrhythmias, with brief periods of sinus tach. She has a history of HTN, anxiety and diabetes.   She called our office on 01/25/2019 with similar symptoms while at work. Reporting that she almost passed out. She requested an appointment sooner than 4//01/2019. She describes having racing HR "coming out of no where" lasting several minutes. She states that she was at rest reading after dinner. She states that she took a whole tablet of Ativan (usually takes 1/2 tab). Waited an hour and then did not find relief. She was seen in ER and reported that her HR was 130 bpm. EKG demonstrated HR in the high 80's She was sent home with no treatment.   She had a similar episode when she was at work 2 days later. She felt as if she would pass out.  She has noticed that her HR has been up and down throughout the last few days. She does not drink ETOH, or Caffeine.   She has not taken her medications today and comes in hypertensive. She is tearful and anxious. She is concerned that there is a problem that we have not found yet with her heart, as these symptoms continue to happen.   Past Medical History:  Diagnosis Date  . Anxiety   . Back pain   . Chest pain    a. 09/2014 Echo: EF 60-65%, no rwma, nl valves;  b. 12/2014 Neg ETT.  . Diabetes mellitus without complication ([place])   . Essential hypertension    a. 01/2016 Renal duplex: no RAS- ? renal cyst (renal u/s 4/17 - no cyst, nl study); b. 02/2016 24 hr BP  Monitor: Mean SBP ~ 138 with Max of 198. Highest pressures noted in evening hours following placement of cuff.  . Fibroid uterus   . GERD (gastroesophageal reflux disease)   . Palpitations    a. 09/2014 nl event monitor.    Past Surgical History:  Procedure Laterality Date  . WISDOM TOOTH EXTRACTION       Current Outpatient Medications  Medication Sig Dispense Refill  . ALPRAZolam (NIRAVAM) 0.25 MG dissolvable tablet Take 1 tablet (0.25 mg total) by mouth at bedtime as needed for anxiety. 20 tablet 1  . Biotin 1000 MCG CHEW Chew 1 tablet by mouth daily.    . blood glucose meter kit and supplies Test blood sugar twice daily. Dx code: R73.09 1 each 0  . carvedilol (COREG) 3.125 MG tablet Take 1 tablet (3.125 mg total) by mouth 2 (two) times daily with a meal. 180 tablet 1  . famotidine (PEPCID) 20 MG tablet Take 1 tablet (20 mg total) by mouth 2 (two) times daily. 180 tablet 0  . fluticasone (FLONASE) 50 MCG/ACT nasal spray Place 2 sprays into both nostrils daily. (Patient taking differently: Place 2 sprays into both nostrils daily as needed (seasonal allergies). ) 16 g 0  . glucose blood (ONETOUCH VERIO) test strip TEST BLOOD SUGAR DAILY 25 each 10  .  LORazepam (ATIVAN) 0.5 MG tablet Take 0.5 tablets (0.25 mg total) by mouth every 6 (six) hours as needed for anxiety. 30 tablet 0  . Multiple Vitamins-Minerals (MULTIVITAMIN GUMMIES ADULT PO) Take 2 tablets by mouth daily.     . Norethindrone-Ethinyl Estradiol-Fe Biphas (LO LOESTRIN FE) 1 MG-10 MCG / 10 MCG tablet Take 1 tablet by mouth at bedtime.     [name] DELICA LANCETS 96G MISC USE TO TEST BLOOD SUGAR DAILY 100 each 3  . OVER THE COUNTER MEDICATION Take 1 tablet by mouth daily. Vitamin B-12 gummy supplement. 1057mg each gummy.     . Tiotropium Bromide Monohydrate (SPIRIVA RESPIMAT) 1.25 MCG/ACT AERS Inhale 2 puffs into the lungs daily. (Patient not taking: Reported on 12/31/2018) 1 Inhaler 0  . triamcinolone cream (KENALOG) 0.1 %  Apply 1 application topically 2 (two) times daily. (Patient not taking: Reported on 12/31/2018) 30 g 0   Current Facility-Administered Medications  Medication Dose Route Frequency Provider Last Rate Last Dose  . cyanocobalamin ((VITAMIN B-12)) injection 1,000 mcg  1,000 mcg Intramuscular Q30 days SShawnee Knapp MD   1,000 mcg at 10/19/18 1837    Allergies:   Penicillins; Tamiflu [oseltamivir phosphate]; Tinidazole; and Diltiazem    Social History:  The patient  reports that she quit smoking about 4 years ago. Her smoking use included cigarettes. She started smoking about 18 years ago. She has a 2.50 pack-year smoking history. She has never used smokeless tobacco. She reports that she does not drink alcohol or use drugs.   Family History:  The patient's family history includes Alzheimer's disease in her maternal grandmother; Cancer in her father; Diabetes (age of onset: 658 in her father; Heart disease in her maternal grandfather; Heart disease (age of onset: 661 in her father; Hypertension in her father; Seizures in her mother.    ROS: All other systems are reviewed and negative. Unless otherwise mentioned in H&P    PHYSICAL EXAM: VS:  BP (!) 162/102 (BP Location: Left Arm, Patient Position: Sitting, Cuff Size: Normal)   Pulse 81   Ht 5' 3"  (1.6 m)   Wt 165 lb 6.4 oz (75 kg)   BMI 29.30 kg/m  , BMI Body mass index is 29.3 kg/m. GEN: Well nourished, well developed, in no acute distress HEENT: normal Neck: no JVD, carotid bruits, or masses Cardiac: RRR; no murmurs, rubs, or gallops,no edema  Respiratory:  Clear to auscultation bilaterally, normal work of breathing GI: soft, nontender, nondistended, + BS MS: no deformity or atrophy Skin: warm and dry, no rash Neuro:  Strength and sensation are intact Psych: euthymic mood, full affect   EKG:  Sinus tachycardia rate of 103. Recent Labs: 10/08/2018: B Natriuretic Peptide 36.0 10/18/2018: Magnesium 2.1; TSH 0.933 01/24/2019: ALT 15; BUN  17; Creatinine, Ser 1.21; Hemoglobin 14.2; Platelets 271; Potassium 3.9; Sodium 138    Lipid Panel    Component Value Date/Time   CHOL 172 10/25/2016 1850   TRIG 87 10/25/2016 1850   HDL 49 10/25/2016 1850   CHOLHDL 3.5 10/25/2016 1850   LDLCALC 106 (H) 10/25/2016 1850   LDLDIRECT 101 (H) 10/25/2016 1850      Wt Readings from Last 3 Encounters:  01/28/19 165 lb 6.4 oz (75 kg)  01/24/19 164 lb (74.4 kg)  01/10/19 164 lb (74.4 kg)      Other studies Reviewed: Echocardiogram 12020-11-13  Stress ECG conclusions: There were no stress arrhythmias or   conduction abnormalities. The stress ECG was normal. - Staged echo:  Normal echo stress - Stress: LV size was normal and appropriately increased from   baseline. LV global systolic function was appropriately augmented   from the prior stage. The estimated LV ejection fraction was   65-70%. Normal wall motion; no LV regional wall motion   abnormalities. - Rest: LV size was normal. The estimated LV ejection fraction was   55-60%.  ASSESSMENT AND PLAN:  1. Tachycardia: She feels this at rest and is symptomatic with feelings of near syncope. She becomes anxious as well. I have discussed taking her off of coreg and beginning cardioselective BB like Toprol or Atenolol. She refuses this. I have explained that this medication may help to keep HR better controlled and allow [place] to start a different antihypertensive medication. She becomes more tearful and states that she doesn't like taking medications   She took her Coreg 3.125 mg BID in the clinic room today as she was hypertensive and felt badly. She is willing to increase the coreg to 6.25 mg in the am, but will not increase the dose in the pm. "My heart might stop while I am sleeping."   We will try this first. In the meantime, I will order echocardiogram to evaluate for changes in her heart function as one has not been done since 2014.   2. Hypertension: She has not taken her medication  today. I expect BP to decrease once medication is in her system.  She states that her BP is elevated when she takes the medication anyway. Maybe the higher dose will be helpful for daytime BP.   3. Anxiety: She is very anxious and difficult to convince that she is not having lethal arrhythmia's but she becomes tearful. I have offered reassurance but she is not accepting that she is not having significant heart issues.    Current medicines are reviewed at length with the patient today.    Labs/ tests ordered today include: Echo  Phill Myron. West Pugh, ANP, [facility]   01/28/2019 9:23 AM    [facility] [place] [address] Office [phone] Fax [phone]

## 2019-01-28 ENCOUNTER — Encounter: Payer: Self-pay | Admitting: Adult Health

## 2019-01-28 ENCOUNTER — Ambulatory Visit: Payer: Managed Care, Other (non HMO) | Admitting: Adult Health

## 2019-01-28 VITALS — BP 162/102 | HR 81 | Ht 63.0 in | Wt 165.4 lb

## 2019-01-28 DIAGNOSIS — F418 Other specified anxiety disorders: Secondary | ICD-10-CM

## 2019-01-28 DIAGNOSIS — R002 Palpitations: Secondary | ICD-10-CM

## 2019-01-28 DIAGNOSIS — I10 Essential (primary) hypertension: Secondary | ICD-10-CM

## 2019-01-28 MED ORDER — CARVEDILOL 3.125 MG PO TABS: ORAL_TABLET | ORAL | 1 refills | Status: DC

## 2019-01-28 NOTE — Patient Instructions (Signed)
Medication Instructions:  INCREASE CARVEDILOL 6.25-AM AND 3.125-PM If you need a refill on your cardiac medications before your next appointment, please call your pharmacy.  Testing/Procedures: Echocardiogram - Your physician has requested that you have an echocardiogram. Echocardiography is a painless test that uses sound waves to create images of your heart. It provides your doctor with information about the size and shape of your heart and how well your heart's chambers and valves are working. This procedure takes approximately one hour. There are no restrictions for this procedure. This will be performed at our [facility] location - [address].  Special Instructions: TAKE AND LOG BP THREE TIMES DAILY  Follow-Up: You will need a follow up appointment on  Wednesday WITH Jory Sims, [place], [place].   At [facility], you and your health needs are our priority.  As part of our continuing mission to provide you with exceptional heart care, we have created designated Provider Care Teams.  These Care Teams include your primary Cardiologist (physician) and Advanced Practice Providers (APPs -  Physician Assistants and Nurse Practitioners) who all work together to provide you with the care you need, when you need it.  Thank you for choosing [facility] at [facility]!!

## 2019-01-29 ENCOUNTER — Telehealth: Payer: Self-pay | Admitting: Adult Health

## 2019-01-29 NOTE — Progress Notes (Signed)
Cardiology Office Note   Date:  01/30/2019   ID:  MOON BUDDE, DOB [birth year], MRN [id number]  PCP:  [name], MD  Cardiologist: [facility]  Chief Complaint  Patient presents with  . Tachycardia    Follow up     History of Present Illness: Brooke Turner is a 43 y.o. female who presents for focused close follow up for near syncope and tachycardia. She was seen two days ago, very anxious with labile HR reaching up to 108 bpm. She was placed on higher dose of coreg 6.25 mg in the am, and continued on 3.125 mg in the pm. She refused change to cardiospecific BB. She was also hypertensive. She had not taken her medications prior to coming to office visit. She was asked to take them in the exam room.   She was to keep up with her HR and BP and bring records with her when she returned to this visit. She had a Holter placed in 10/2018 and which did not reveal any significant arrhythmias, with brief periods of sinus tachycardia. Echocardiogram was ordered.   The echo was cancelled due COVID 19 non-essential studies. She states that she is doing much better. No  Recurrence of rapid HR. She has a headache now, which is is worried is related to the increased dose of coreg.   Past Medical History:  Diagnosis Date  . Anxiety   . Back pain   . Chest pain    a. 09/2014 Echo: EF 60-65%, no rwma, nl valves;  b. 12/2014 Neg ETT.  . Diabetes mellitus without complication ([place])   . Essential hypertension    a. 01/2016 Renal duplex: no RAS- ? renal cyst (renal u/s 4/17 - no cyst, nl study); b. 02/2016 24 hr BP Monitor: Mean SBP ~ 138 with Max of 198. Highest pressures noted in evening hours following placement of cuff.  . Fibroid uterus   . GERD (gastroesophageal reflux disease)   . Palpitations    a. 09/2014 nl event monitor.    Past Surgical History:  Procedure Laterality Date  . WISDOM TOOTH EXTRACTION       Current Outpatient Medications  Medication Sig Dispense Refill  .  ALPRAZolam (NIRAVAM) 0.25 MG dissolvable tablet Take 1 tablet (0.25 mg total) by mouth at bedtime as needed for anxiety. 20 tablet 1  . Biotin 1000 MCG CHEW Chew 1 tablet by mouth daily.    . blood glucose meter kit and supplies Test blood sugar twice daily. Dx code: R73.09 1 each 0  . carvedilol (COREG) 3.125 MG tablet Take 2 tablets (6.25 mg total) by mouth every morning AND 1 tablet (3.125 mg total) every evening. 180 tablet 1  . famotidine (PEPCID) 20 MG tablet Take 1 tablet (20 mg total) by mouth 2 (two) times daily. 180 tablet 0  . fluticasone (FLONASE) 50 MCG/ACT nasal spray Place 2 sprays into both nostrils daily. (Patient taking differently: Place 2 sprays into both nostrils daily as needed (seasonal allergies). ) 16 g 0  . glucose blood (ONETOUCH VERIO) test strip TEST BLOOD SUGAR DAILY 25 each 10  . LORazepam (ATIVAN) 0.5 MG tablet Take 0.5 tablets (0.25 mg total) by mouth every 6 (six) hours as needed for anxiety. 30 tablet 0  . Multiple Vitamins-Minerals (MULTIVITAMIN GUMMIES ADULT PO) Take 2 tablets by mouth daily.     . Norethindrone-Ethinyl Estradiol-Fe Biphas (LO LOESTRIN FE) 1 MG-10 MCG / 10 MCG tablet Take 1 tablet by mouth at bedtime.     Marland Kitchen  ONETOUCH DELICA LANCETS 83G MISC USE TO TEST BLOOD SUGAR DAILY 100 each 3  . OVER THE COUNTER MEDICATION Take 1 tablet by mouth daily. Vitamin B-12 gummy supplement. 1036mg each gummy.     . Tiotropium Bromide Monohydrate (SPIRIVA RESPIMAT) 1.25 MCG/ACT AERS Inhale 2 puffs into the lungs daily. 1 Inhaler 0  . triamcinolone cream (KENALOG) 0.1 % Apply 1 application topically 2 (two) times daily. 30 g 0   Current Facility-Administered Medications  Medication Dose Route Frequency Provider Last Rate Last Dose  . cyanocobalamin ((VITAMIN B-12)) injection 1,000 mcg  1,000 mcg Intramuscular Q30 days SShawnee Knapp MD   1,000 mcg at 10/19/18 1837    Allergies:   Penicillins; Tamiflu [oseltamivir phosphate]; Tinidazole; and Diltiazem    Social  History:  The patient  reports that she quit smoking about 4 years ago. Her smoking use included cigarettes. She started smoking about 18 years ago. She has a 2.50 pack-year smoking history. She has never used smokeless tobacco. She reports that she does not drink alcohol or use drugs.   Family History:  The patient's family history includes Alzheimer's disease in her maternal grandmother; Cancer in her father; Diabetes (age of onset: 679 in her father; Heart disease in her maternal grandfather; Heart disease (age of onset: 645 in her father; Hypertension in her father; Seizures in her mother.    ROS: All other systems are reviewed and negative. Unless otherwise mentioned in H&P    PHYSICAL EXAM: VS:  BP 130/72   Pulse 72   Ht 5' 4"  (1.626 m)   Wt 167 lb 9.6 oz (76 kg)   BMI 28.77 kg/m  , BMI Body mass index is 28.77 kg/m. GEN: Well nourished, well developed, in no acute distress HEENT: normal Neck: no JVD, carotid bruits, or masses Cardiac: RRR; no murmurs, rubs, or gallops,no edema  Respiratory:  Clear to auscultation bilaterally, normal work of breathing GI: soft, nontender, nondistended, + BS MS: no deformity or atrophy Skin: warm and dry, no rash Neuro:  Strength and sensation are intact Psych: euthymic mood, full affect   EKG:  No completed this office visit.   Recent Labs: 10/08/2018: B Natriuretic Peptide 36.0 10/18/2018: Magnesium 2.1; TSH 0.933 01/24/2019: ALT 15; BUN 17; Creatinine, Ser 1.21; Hemoglobin 14.2; Platelets 271; Potassium 3.9; Sodium 138    Lipid Panel    Component Value Date/Time   CHOL 172 10/25/2016 1850   TRIG 87 10/25/2016 1850   HDL 49 10/25/2016 1850   CHOLHDL 3.5 10/25/2016 1850   LDLCALC 106 (H) 10/25/2016 1850   LDLDIRECT 101 (H) 10/25/2016 1850      Wt Readings from Last 3 Encounters:  01/30/19 167 lb 9.6 oz (76 kg)  01/28/19 165 lb 6.4 oz (75 kg)  01/24/19 164 lb (74.4 kg)      Other studies Reviewed: Echocardiogram 111-03-18  Stress ECG conclusions: There were no stress arrhythmias or   conduction abnormalities. The stress ECG was normal. - Staged echo: Normal echo stress - Stress: LV size was normal and appropriately increased from   baseline. LV global systolic function was appropriately augmented   from the prior stage. The estimated LV ejection fraction was   65-70%. Normal wall motion; no LV regional wall motion   abnormalities. - Rest: LV size was normal. The estimated LV ejection fraction was   55-60%.  ASSESSMENT AND PLAN:  1. Inappropriate tachycardia: She is doing better with increased dose of coreg to 6.25 mg in the am and  3.125 in the pm. Echo was rescheduled. Will place another 30 day Zio monitor for survelience.   2. Hypertension: BP is much better controlled. She took her medications before coming in today. Home BP measurements reveal excellent control of BP in the 005'R systolic, 10'Y to 11'Z diastolic.    Current medicines are reviewed at length with the patient today.    Labs/ tests ordered today include: None  Phill Myron. West Pugh, ANP, [facility]   01/30/2019 11:57 AM    [facility] [place] [address] Office [phone] Fax [phone]

## 2019-01-29 NOTE — Telephone Encounter (Signed)
Pt called because she was confused about her Echo being cancelled, but not her office visit. The office visit was to go over the results of the echo.

## 2019-01-30 ENCOUNTER — Ambulatory Visit (HOSPITAL_COMMUNITY): Payer: Managed Care, Other (non HMO)

## 2019-01-30 ENCOUNTER — Other Ambulatory Visit: Payer: Self-pay

## 2019-01-30 ENCOUNTER — Ambulatory Visit: Payer: Managed Care, Other (non HMO) | Admitting: Adult Health

## 2019-01-30 ENCOUNTER — Encounter: Payer: Self-pay | Admitting: Adult Health

## 2019-01-30 VITALS — BP 130/72 | HR 72 | Ht 64.0 in | Wt 167.6 lb

## 2019-01-30 DIAGNOSIS — F418 Other specified anxiety disorders: Secondary | ICD-10-CM

## 2019-01-30 DIAGNOSIS — I10 Essential (primary) hypertension: Secondary | ICD-10-CM

## 2019-01-30 DIAGNOSIS — R000 Tachycardia, unspecified: Secondary | ICD-10-CM | POA: Diagnosis not present

## 2019-01-30 DIAGNOSIS — I519 Heart disease, unspecified: Secondary | ICD-10-CM

## 2019-01-30 NOTE — Patient Instructions (Signed)
Medication Instructions:  NO CHANGES- Your physician recommends that you continue on your current medications as directed. Please refer to the Current Medication list given to you today.  If you need a refill on your cardiac medications before your next appointment, please call your pharmacy.  Testing/Procedures: Echocardiogram(IN 3 MONTHS) - Your physician has requested that you have an echocardiogram. Echocardiography is a painless test that uses sound waves to create images of your heart. It provides your doctor with information about the size and shape of your heart and how well your heart's chambers and valves are working. This procedure takes approximately one hour. There are no restrictions for this procedure. This will be performed at our [facility] location - [address].  Your physician has recommended that you wear a 30 DAY ZIO-PATCH monitor. The Zio patch cardiac monitor continuously records heart rhythm data for up to 14 days, this is for patients being evaluated for multiple types heart rhythms. For the first 24 hours post application, please avoid getting the Zio monitor wet in the shower or by excessive sweating during exercise. After that, feel free to carry on with regular activities. Keep soaps and lotions away from the ZIO XT Patch.  This will be placed at our [facility] location - [address], [address].       Follow-Up: You will need a follow up appointment in [facility], MD or one of the following Advanced Practice Providers on your designated Care Team:  Rosaria Ferries, PA-C   Jory Sims, DNP, ANP      At [facility], you and your health needs are our priority.  As part of our continuing mission to provide you with exceptional heart care, we have created designated Provider Care Teams.  These Care Teams include your primary Cardiologist (physician) and Advanced Practice Providers (APPs -  Physician Assistants and Nurse Practitioners) who  all work together to provide you with the care you need, when you need it.  Thank you for choosing [facility] at [facility]!!

## 2019-01-31 ENCOUNTER — Ambulatory Visit: Payer: Managed Care, Other (non HMO) | Admitting: Family Medicine

## 2019-01-31 NOTE — Addendum Note (Signed)
Addended by: Waylan Rocher on: 01/31/2019 08:40 AM   Modules accepted: Orders

## 2019-02-04 ENCOUNTER — Telehealth: Payer: Self-pay | Admitting: Cardiology

## 2019-02-04 NOTE — Telephone Encounter (Signed)
Leave message for pt to call back 

## 2019-02-04 NOTE — Telephone Encounter (Signed)
New Message ° ° ° °Patient returning phone call  °

## 2019-02-05 ENCOUNTER — Ambulatory Visit: Payer: Self-pay | Admitting: *Deleted

## 2019-02-05 ENCOUNTER — Telehealth: Payer: Self-pay | Admitting: Internal Medicine

## 2019-02-05 NOTE — Telephone Encounter (Signed)
LMTCB. PFT can reschedule PFT and f/u end of June if CY is in clinic.

## 2019-02-05 NOTE — Telephone Encounter (Signed)
Pt reports temp usually 97.0, states today 98.2.  Denies any other symptoms, has not traveled. 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 Pt questioning where testing can be done. Reviewed protocols, criteria, precautions. Pt verbalizes understanding. Pt is former pt of Dr. Manya Silvas and would like to stay with her as PCP. States she has seen Dr. Nolon Rod before if she has any additional advise would like it to come from her. Will also attempt to reach Dr. Brigitte Pulse. Home care advise given.  Reason for Disposition . COVID-19 Testing, questions about  Answer Assessment - Initial Assessment Questions 1. COVID-19 DIAGNOSIS: "Who made your Coronavirus (COVID-19) diagnosis?" "Was it confirmed by a positive lab test?" If not diagnosed by a HCP, ask "Are there lots of cases (community spread) where you live?" (See public health department website, if unsure)   * MAJOR community spread: high number of cases; numbers of cases are increasing; many people hospitalized.   * MINOR community spread: low number of cases; not increasing; few or no people hospitalized     *No Answer* 2. ONSET: "When did the COVID-19 symptoms start?"      *No Answer* 3. WORST SYMPTOM: "What is your worst symptom?" (e.g., cough, fever, shortness of breath, muscle aches)     *No Answer* 4. COUGH: "How bad is the cough?"       *No Answer* 5. FEVER: "Do you have a fever?" If so, ask: "What is your temperature, how was it measured, and when did it start?"     "Temp usually 97.0, today 98.2 6. RESPIRATORY STATUS: "Describe your breathing?" (e.g., shortness of breath, wheezing, unable to speak)      *No Answer* 7. BETTER-SAME-WORSE: "Are you getting better, staying the same or getting worse compared to yesterday?"  If getting worse, ask, "In what way?"     *No Answer* 8. HIGH RISK DISEASE: "Do you have any chronic medical problems?" (e.g., asthma, heart or lung disease, weak immune system,  etc.)     *No Answer* 9. PREGNANCY: "Is there any chance you are pregnant?" "When was your last menstrual period?"     *No Answer* 10. OTHER SYMPTOMS: "Do you have any other symptoms?"  (e.g., runny nose, headache, sore throat, loss of smell)       *No Answer*  Protocols used: CORONAVIRUS (COVID-19) DIAGNOSED OR SUSPECTED-A-AH

## 2019-02-06 NOTE — Telephone Encounter (Signed)
Spoke with pt. She is wanting to move up her PFT appointment to July instead of waiting until August when she sees CY. PFT has been moved to 05/08/2019 at 4pm. OV with CY will stay the same per pt's request. Nothing further was needed at this time.

## 2019-02-06 NOTE — Telephone Encounter (Signed)
Spoke with [place].

## 2019-02-06 NOTE — Telephone Encounter (Signed)
Pt have appt with Jory Sims 06/19

## 2019-02-07 ENCOUNTER — Telehealth: Payer: Managed Care, Other (non HMO) | Admitting: Family

## 2019-02-07 ENCOUNTER — Emergency Department (HOSPITAL_COMMUNITY): Payer: Managed Care, Other (non HMO)

## 2019-02-07 ENCOUNTER — Encounter (HOSPITAL_COMMUNITY): Payer: Self-pay

## 2019-02-07 ENCOUNTER — Other Ambulatory Visit: Payer: Self-pay

## 2019-02-07 ENCOUNTER — Emergency Department (HOSPITAL_COMMUNITY)
Admission: EM | Admit: 2019-02-07 | Discharge: 2019-02-07 | Disposition: A | Discharge status: Home / Self Care | Payer: Managed Care, Other (non HMO) | Attending: Emergency Medicine | Admitting: Emergency Medicine

## 2019-02-07 ENCOUNTER — Ambulatory Visit: Payer: Self-pay

## 2019-02-07 DIAGNOSIS — R05 Cough: Secondary | ICD-10-CM

## 2019-02-07 DIAGNOSIS — E119 Type 2 diabetes mellitus without complications: Secondary | ICD-10-CM | POA: Diagnosis not present

## 2019-02-07 DIAGNOSIS — I10 Essential (primary) hypertension: Secondary | ICD-10-CM | POA: Diagnosis not present

## 2019-02-07 DIAGNOSIS — B9789 Other viral agents as the cause of diseases classified elsewhere: Secondary | ICD-10-CM | POA: Diagnosis not present

## 2019-02-07 DIAGNOSIS — J069 Acute upper respiratory infection, unspecified: Secondary | ICD-10-CM | POA: Diagnosis not present

## 2019-02-07 DIAGNOSIS — Z79899 Other long term (current) drug therapy: Secondary | ICD-10-CM | POA: Insufficient documentation

## 2019-02-07 DIAGNOSIS — F419 Anxiety disorder, unspecified: Secondary | ICD-10-CM | POA: Diagnosis not present

## 2019-02-07 DIAGNOSIS — R0602 Shortness of breath: Secondary | ICD-10-CM | POA: Diagnosis present

## 2019-02-07 DIAGNOSIS — Z87891 Personal history of nicotine dependence: Secondary | ICD-10-CM | POA: Diagnosis not present

## 2019-02-07 DIAGNOSIS — R059 Cough, unspecified: Secondary | ICD-10-CM

## 2019-02-07 MED ORDER — ALBUTEROL SULFATE HFA 108 (90 BASE) MCG/ACT IN AERS: 1.0000 | INHALATION_SPRAY | Freq: Four times a day (QID) | RESPIRATORY_TRACT | 0 refills | Status: DC | PRN

## 2019-02-07 NOTE — Telephone Encounter (Signed)
Patient called and says she her doses of Carvedilol was increased by the cardiologist PA and since then she's been having feelings of asthma, like it's hard to breathe. She asks could this be the cause. 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 She says the lady last worked on 01/25/19 and her job closed yesterday to deep clean when they found out about her passing. She says that she was not close sitting next to her, but she was in the breakroom with her once or twice and says she was around the co-workers who sat next to her in the cubicle. She also says that people walked around coughing and sneezing. I asked about her other symptoms, she says she has a dry cough at times, no fever. I asked about travel, she denies. According to protocol, go to ED. I advised and she wanted me to call the office to see if she can be seen in the office instead. I called the office and spoke to [place], [facility] who was advised by clinical to have the patient to do an e-visit on the [facility] website. I advised the patient and she wasn't sure about doing it and asked questions about how does it work and would they be able to help her get tested. I advised no testing in the office and they would advise the best medical advice based on the symptoms she's having, she verbalized understanding. She asks if Dr. Nolon Rod is available tomorrow for a web visit, I advised no openings tomorrow. She says she will try the e-visit. She asked about the carvedilol. I advised to call the cardiologist office, she says she called and is waiting for a call back.   Reason for Disposition . [1] Difficulty breathing occurs AND [2] within 14 days of COVID-19 EXPOSURE (Close Contact)  Answer Assessment - Initial Assessment Questions 1. CLOSE CONTACT: "Who is the person with the confirmed or suspected COVID-19 infection that you were exposed to?"     Last week lady who was diagnosed/passed  COVID-19 and other coworkers was around this same lady 2. PLACE of CONTACT: "Where were you when you were exposed to COVID-19?" (e.g., home, school, medical waiting room; which city?)     [employer]; [place], [place] 3. TYPE of CONTACT: "How much contact was there?" (e.g., sitting next to, live in same house, work in same office, same building)     Same call center 4. DURATION of CONTACT: "How long were you in contact with the COVID-19 patient?" (e.g., a few seconds, passed by person, a few minutes, live with the patient)     8 hours per day 5. DATE of CONTACT: "When did you have contact with a COVID-19 patient?" (e.g., how many days ago)     01/25/19 Last day the lady was in the breakroom; coworkers on Tuesday 6. TRAVEL: "Have you traveled out of the country recently?" If so, "When and where?"     * Also ask about out-of-state travel, since the CDC has identified some high risk cities for community spread in the [place].     * Note: Travel becomes less relevant if there is widespread community transmission where the patient lives.     No travel and no exposure to anyone who has traveled 7. COMMUNITY SPREAD: "Are there lots of cases or COVID-19 (community spread) where you live?" (See public health department website, if unsure)   * MAJOR community spread:  high number of cases; numbers of cases are increasing; many people hospitalized.   * MINOR community spread: low number of cases; not increasing; few or no people hospitalized     No idea 8. SYMPTOMS: "Do you have any symptoms?" (e.g., fever, cough, breathing difficulty)     Dry cough comes and goes; SOB  9. PREGNANCY OR POSTPARTUM: "Is there any chance you are pregnant?" "When was your last menstrual period?" "Did you deliver in the last 2 weeks?"     No 10. HIGH RISK: "Do you have any heart or lung problems? Do you have a weak immune system?" (e.g., CHF, COPD, asthma, HIV positive, chemotherapy, renal failure, diabetes mellitus, sickle cell  anemia)     Seeing a pulmonologist testing for asthma  Protocols used: CORONAVIRUS (COVID-19) EXPOSURE-A-AH

## 2019-02-07 NOTE — Progress Notes (Signed)
  E-Visit for Corona Virus Screening  Based on what you have shared with me, you need to seek an evaluation for a severe illness that is causing your symptoms which may be coronavirus or some other illness. I recommend that you be seen and evaluated "face to face". Our Emergency Departments are best equipped to handle patients with severe symptoms.   I recommend the following:  . If you are having a true medical emergency please call 911. . If you are considered high risk for Corona virus because of a known exposure, fever, shortness of breath and cough, OR if you have severe symptoms of any kind, seek medical care at an emergency room.  . Please call ahead and tell them that you were seen by telemedicine and they have recommended that you have a face to face evaluation. . [facility] Emergency Department [address], [place], [place] [address] [phone]  . [facility] [place] Emergency Department [address], [place], [place] [address] [phone]  . [facility] Emergency Department [address], [place], [place] [address] [phone]  . [facility] Emergency Department [address], [place], [place] [address] [phone]  .  [facility] Emergency Department [address], [place], [place] [address] [phone]  NOTE: If you entered your credit card information for this eVisit, you will not be charged. You may see a "hold" on your card for the $35 but that hold will drop off and you will not have a charge processed.   Your e-visit answers were reviewed by a board certified advanced clinical practitioner to complete your personal care plan.  Thank you for using e-Visits.  

## 2019-02-07 NOTE — ED Provider Notes (Addendum)
[place] DEPT Provider Note   CSN: [id number] Arrival date & time: 02/07/19  1810    History   Chief Complaint Chief Complaint  Patient presents with  . Shortness of Breath  . Cough    HPI Brooke Turner is a 43 y.o. female.     Patient is a 43 year old female with past medical history of anxiety, diabetes, GERD who presents the emergency department for cough, shortness of breath.  Reports that this started about 3 days ago.  Reports that she feels like she cannot catch her breath when she is speaking long sentences.  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  She is concerned for this.  She denies any fever, chills, nausea, vomiting.  Reports that she had 1 day of abdominal pain but that is resolved.  Denies any diarrhea, constipation.  Patient is very anxious.     Past Medical History:  Diagnosis Date  . Anxiety   . Back pain   . Chest pain    a. 09/2014 Echo: EF 60-65%, no rwma, nl valves;  b. 12/2014 Neg ETT.  . Diabetes mellitus without complication ([place])   . Essential hypertension    a. 01/2016 Renal duplex: no RAS- ? renal cyst (renal u/s 4/17 - no cyst, nl study); b. 02/2016 24 hr BP Monitor: Mean SBP ~ 138 with Max of 198. Highest pressures noted in evening hours following placement of cuff.  . Fibroid uterus   . GERD (gastroesophageal reflux disease)   . Palpitations    a. 09/2014 nl event monitor.    Patient Active Problem List   Diagnosis Date Noted  . History of posttraumatic stress disorder (PTSD) 12/31/2018  . History of panic attacks 12/31/2018  . Palpitations   . Atypical chest pain   . Chronic tension-type headache, not intractable 06/10/2015  . Post-traumatic stress reaction 02/25/2015  . Anxiety disorder due to general medical condition with panic attack 02/25/2015  . Subserous leiomyoma of uterus 02/25/2015  . Anxiety about health  02/25/2015  . Deviated nasal septum 12/30/2014  . Chronic rhinitis 12/30/2014  . Chronic maxillary sinusitis 12/30/2014  . Snorings 09/30/2014  . Prediabetes 08/26/2014  . Acanthosis nigricans 03/19/2014  . Vitamin D deficiency 07/15/2013  . Large breasts 09/12/2012  . Essential hypertension 02/17/2012  . Obesity (BMI 30-39.9) previous BMI was over 40. Patient has lost 70 pounds in the last year 02/17/2012  . Anxiety disorder 02/17/2012  . Chronic back pain 02/17/2012    Past Surgical History:  Procedure Laterality Date  . WISDOM TOOTH EXTRACTION       OB History    Gravida  2   Para  0   Term  0   Preterm  0   AB  2   Living  0     SAB  1   TAB  1   Ectopic  0   Multiple  0   Live Births               Home Medications    Prior to Admission medications   Medication Sig Start Date End Date Taking? Authorizing Provider  albuterol (PROVENTIL HFA;VENTOLIN HFA) 108 (90 Base) MCG/ACT inhaler Inhale 1-2 puffs into the lungs every 6 (six) hours as needed for wheezing or shortness of breath. 02/07/19   Alveria Apley, PA-C  ALPRAZolam (NIRAVAM) 0.25 MG dissolvable tablet Take 1 tablet (0.25 mg total)  by mouth at bedtime as needed for anxiety. 10/01/18   McVey, Gelene Mink, PA-C  Biotin 1000 MCG CHEW Chew 1 tablet by mouth daily.    [provider]  blood glucose meter kit and supplies Test blood sugar twice daily. Dx code: R73.09 07/22/15   [name], MD  carvedilol (COREG) 3.125 MG tablet Take 2 tablets (6.25 mg total) by mouth every morning AND 1 tablet (3.125 mg total) every evening. 01/28/19   Lendon Colonel, NP  famotidine (PEPCID) 20 MG tablet Take 1 tablet (20 mg total) by mouth 2 (two) times daily. 11/20/18   [name], MD  fluticasone (FLONASE) 50 MCG/ACT nasal spray Place 2 sprays into both nostrils daily. Patient taking differently: Place 2 sprays into both nostrils daily as needed (seasonal allergies).  10/10/17   Tenna Delaine D,  PA-C  glucose blood [facility] VERIO) test strip TEST BLOOD SUGAR DAILY 11/16/18   [name], MD  LORazepam (ATIVAN) 0.5 MG tablet Take 0.5 tablets (0.25 mg total) by mouth every 6 (six) hours as needed for anxiety. 01/24/19   Forrest Moron, MD  Multiple Vitamins-Minerals (MULTIVITAMIN GUMMIES ADULT PO) Take 2 tablets by mouth daily.     [provider]  Norethindrone-Ethinyl Estradiol-Fe Biphas (LO LOESTRIN FE) 1 MG-10 MCG / 10 MCG tablet Take 1 tablet by mouth at bedtime.     [provider]  Jonetta Speak LANCETS 12W MISC USE TO TEST BLOOD SUGAR DAILY 11/16/18   [name], MD  OVER THE COUNTER MEDICATION Take 1 tablet by mouth daily. Vitamin B-12 gummy supplement. 1080mg each gummy.     [provider]  Tiotropium Bromide Monohydrate (SPIRIVA RESPIMAT) 1.25 MCG/ACT AERS Inhale 2 puffs into the lungs daily. 10/25/18   YBaird LyonsD, MD  triamcinolone cream (KENALOG) 0.1 % Apply 1 application topically 2 (two) times daily. 08/06/18   HPosey Boyer MD    Family History Family History  Problem Relation Age of Onset  . Seizures Mother   . Cancer Father        Liver  . Diabetes Father 621 . Hypertension Father   . Heart disease Father 63      CEA, LE [place]  . Alzheimer's disease Maternal Grandmother   . Heart disease Maternal Grandfather     Social History Social History   Tobacco Use  . Smoking status: Former Smoker    Packs/day: 0.25    Years: 10.00    Pack years: 2.50    Types: Cigarettes    Start date: 06/25/2000    Last attempt to quit: 08/19/2014    Years since quitting: 4.4  . Smokeless tobacco: Never Used  Substance Use Topics  . Alcohol use: No    Alcohol/week: 0.0 standard drinks  . Drug use: No     Allergies   Penicillins; Tamiflu [oseltamivir phosphate]; Tinidazole; and Diltiazem   Review of Systems Review of Systems  Constitutional: Negative for chills, diaphoresis and fever.  HENT: Negative for ear pain and sore  throat.   Eyes: Negative for pain and visual disturbance.  Respiratory: Positive for cough and shortness of breath. Negative for chest tightness and wheezing.   Cardiovascular: Negative for chest pain and palpitations.  Gastrointestinal: Positive for abdominal pain. Negative for diarrhea, nausea and vomiting.  Genitourinary: Negative for dysuria and hematuria.  Musculoskeletal: Negative for arthralgias and back pain.  Skin: Negative for color change and rash.  Neurological: Negative for dizziness, seizures and  syncope.  All other systems reviewed and are negative.    Physical Exam Updated Vital Signs BP (!) 175/94   Pulse 84   Temp 98.6 F (37 C) (Oral)   Resp 16   Ht _0  (1.626 m)   Wt 76 kg   SpO2 100%   BMI 28.76 kg/m   Physical Exam Vitals signs and nursing note reviewed.  Constitutional:      General: She is not in acute distress.    Appearance: She is well-developed.     Comments: Patient is talking in complete in full sentences without being short of breath or without oxygen with sats dropping below 100%.  HENT:     Head: Normocephalic and atraumatic.     Mouth/Throat:     Mouth: Mucous membranes are moist.  Eyes:     Conjunctiva/sclera: Conjunctivae normal.  Neck:     Musculoskeletal: Neck supple.  Pulmonary:     Effort: Pulmonary effort is normal. No tachypnea, bradypnea, accessory muscle usage or respiratory distress.     Breath sounds: No stridor.  Skin:    General: Skin is warm and dry.  Neurological:     Mental Status: She is alert.  Psychiatric:        Mood and Affect: Mood is anxious.      ED Treatments / Results  Labs (all labs ordered are listed, but only abnormal results are displayed) Labs Reviewed - No data to display  EKG None  Radiology Dg Chest Portable 1 View  Result Date: 02/07/2019 CLINICAL DATA:  Cough. EXAM: PORTABLE CHEST 1 VIEW COMPARISON:  Radiographs of January 10, 2019. FINDINGS: The heart size and mediastinal contours are  within normal limits. Both lungs are clear. The visualized skeletal structures are unremarkable. IMPRESSION: No active disease. Electronically Signed   By: Marijo Conception, M.D.   On: 02/07/2019 19:23    Procedures Procedures (including critical care time)  Medications Ordered in ED Medications - No data to display   Initial Impression / Assessment and Plan / ED Course  I have reviewed the triage vital signs and the nursing notes.  Pertinent labs & imaging results that were available during my care of the patient were reviewed by me and considered in my medical decision making (see chart for details).  Clinical Course as of Feb 07 2135  Thu Feb 07, 2019  1853 Patient here for shortness of breath and cough.  Patient is very anxious.  I discussed with patient that she appears well and oxygen sats are normal and she is not tachypneic.  I discussed we will monitor her oxygen with ambulation and obtain chest x-ray. Patient wanted to be tested for coronavirus.  I discussed with the patient that she does not meet criteria for testing.  I discussed with the patient that she does have symptoms of the coronavirus and likely has had exposure and therefore she should assume that she has a virus.  Patient became very anxious and very tearful.  I reassured the patient that her vitals appear very normal and her oxygen sats are normal. Patient has had at least 25 emergency department visits in the last year, all without any admissions and all for various reasons including chest pain, SOB.    [KM]    Clinical Course User Index [KM] Alveria Apley, PA-C      Based on review of vitals, medical screening exam, lab work and/or imaging, there does not appear to be an acute, emergent etiology for  the patient's symptoms. Counseled pt on good return precautions and encouraged both PCP and ED follow-up as needed.  Prior to discharge, I also discussed incidental imaging findings with patient in detail and advised  appropriate, recommended follow-up in detail.  Clinical Impression: 1. Anxiety   2. Viral URI with cough     Disposition: Discharge  This note was prepared with assistance of Dragon voice recognition software. Occasional wrong-word or sound-a-like substitutions may have occurred due to the inherent limitations of voice recognition software.   Final Clinical Impressions(s) / ED Diagnoses   Final diagnoses:  Anxiety  Viral URI with cough    ED Discharge Orders         Ordered    albuterol (PROVENTIL HFA;VENTOLIN HFA) 108 (90 Base) MCG/ACT inhaler  Every 6 hours PRN     02/07/19 1959           Kristine Royal 02/07/19 Rick Duff, MD 02/07/19 2130    Alveria Apley, PA-C 02/07/19 2137    Ripley Fraise, MD 02/07/19 2144

## 2019-02-07 NOTE — Telephone Encounter (Signed)
Provider advised

## 2019-02-07 NOTE — Discharge Instructions (Addendum)
Your chest xray was normal and your vital signs looked good. Your blood pressure was a little elevated. You have symptoms of a possible virus given that you have a cough and feeling SOB. Your workup is reassuring and you do not meet criteria for inpatient treatment. Unfortunately we are unable to test you for coronavirus at this time due to limited testing and CDC and NCDHHS guidelines. We are asking everyone with symptoms such as yours to stay home and quarantine themselves since there is a possibility that you have the coronavirus. Please use your pulse ox at home and monitor yourself. If your oxygen drops below 90% or you start breathing more than 30 times a minutes please return to the ED or call 911. Most people with the coronavirus recover and have only a mild illness. If you do get more tests available, we may be able to test you in the future.   Please follow the following instructions for 7 days from symptom onset AND until you are fever free for 72 hours without medication  Stay home except to get medical care You should restrict activities outside your home, except for getting medical care. Do not go to work, school, or public areas, and do not use public transportation or taxis.  Call ahead before visiting your doctor Before your medical appointment, call the healthcare provider and tell them that you have viral illness symptoms  Monitor your symptoms Seek prompt medical attention if your illness is worsening (e.g., difficulty breathing).   Wear a facemask You should wear a facemask that covers your nose and mouth when you are in the same room with other people and when you visit a healthcare provider. People who live with or visit you should also wear a facemask while they are in the same room with you.  Separate yourself from other people in your home As much as possible, you should stay in a different room from other people in your home. Also, you should use a separate bathroom, if  available.  Avoid sharing household items You should not share dishes, drinking glasses, cups, eating utensils, towels, bedding, or other items with other people in your home. After using these items, you should wash them thoroughly with soap and water.  Cover your coughs and sneezes Cover your mouth and nose with a tissue when you cough or sneeze, or you can cough or sneeze into your sleeve. Throw used tissues in a lined trash can, and immediately wash your hands with soap and water for at least 20 seconds or use an alcohol-based hand rub.  Wash your [facility] your hands often and thoroughly with soap and water for at least 20 seconds. You can use an alcohol-based hand sanitizer if soap and water are not available and if your hands are not visibly dirty. Avoid touching your eyes, nose, and mouth with unwashed hands.

## 2019-02-07 NOTE — ED Triage Notes (Signed)
Pt reports SHOB and cough that has progressed over the last few days. Pt reports having a co-worker that tested positive for coronavirus.

## 2019-02-07 NOTE — ED Notes (Signed)
Pt ambulated around room and SpO2 stayed at 98%

## 2019-02-08 ENCOUNTER — Ambulatory Visit: Payer: Managed Care, Other (non HMO) | Admitting: Cardiology

## 2019-02-08 ENCOUNTER — Telehealth: Payer: Self-pay | Admitting: Internal Medicine

## 2019-02-08 ENCOUNTER — Telehealth: Payer: Self-pay | Admitting: *Deleted

## 2019-02-08 ENCOUNTER — Ambulatory Visit: Payer: Self-pay

## 2019-02-08 NOTE — Telephone Encounter (Signed)
Left message for patient to call back  

## 2019-02-08 NOTE — Telephone Encounter (Signed)
Returned pt's.  Pt. Verbalized feeling scared with current symptoms of feeling winded, and occas. "slight" cough.  Denied fever.  Reported she did an E-Visit yesterday, and was advised to go to ER.  Did go to the ER.  Stated she wanted to get tested to understand if she has the Coronavirus.  Verbalized "I don't know what to do about my breathing."  Reported she feels like she is running out of air.  Able to speak in full sentences, and able to give detailed account of her symptoms and history.  Questioned about using the Albuterol Inhaler that was ordered; pt. Reported she is not able to tolerate Albuterol, as it raises her heart rate.  Encouraged pt. To return to ER if she feels her breathing has worsened.  Does not have PCP at present, since Dr. Brigitte Pulse left [place].  Offered to schedule appt. For future appt. to re-establish care with another [place] provider.  Stated she wants to wait and follow Dr. Brigitte Pulse at her new office.  Voiced concern about using Spiriva Inhaler, that Pulmonology gave her; had questions about using it; reported it caused chest pain, when she 1st started using it, and hasn't continued to use it.  Advised to call Pulmonology re: questions related to Spiriva. Related fear that a former coworker contracted Coronavirus and then died recently.  Advised pt., with worsening shortness of breath, she should go to the ER.  Verb. Understanding.  Agreed with plan.     Reason for Disposition . MODERATE difficulty breathing (e.g., speaks in phrases, SOB even at rest, pulse 100-120)    Described breathing as "feeling winded; I'm running out of air."  Reported occasional "slight cough"; no fever.  Able to speak in complete sentences; no noted pause to get her breath. Does not have a PCP @ present.  Answer Assessment - Initial Assessment Questions 1. COVID-19 DIAGNOSIS: "Who made your Coronavirus (COVID-19) diagnosis?" "Was it confirmed by a positive lab test?" If not diagnosed by a HCP, ask "Are there  lots of cases (community spread) where you live?" (See public health department website, if unsure)   * MAJOR community spread: high number of cases; numbers of cases are increasing; many people hospitalized.   * MINOR community spread: low number of cases; not increasing; few or no people hospitalized     Minor  2. ONSET: "When did the COVID-19 symptoms start?"      4 days ago 3. WORST SYMPTOM: "What is your worst symptom?" (e.g., cough, fever, shortness of breath, muscle aches)    Shortness of breath. ; described as "feeling winded"; "running out of air"  4. COUGH: "How bad is the cough?"       Occasional "slight cough" 5. FEVER: "Do you have a fever?" If so, ask: "What is your temperature, how was it measured, and when did it start?"     denied 6. RESPIRATORY STATUS: "Describe your breathing?" (e.g., shortness of breath, wheezing, unable to speak)      It's like "I'm running out of air; and I feel like I am breathing heavier." 7. BETTER-SAME-WORSE: "Are you getting better, staying the same or getting worse compared to yesterday?"  If getting worse, ask, "In what way?"     Feels her breathing is a little worse than yesterday.  8. HIGH RISK DISEASE: "Do you have any chronic medical problems?" (e.g., asthma, heart or lung disease, weak immune system, etc.)     Stated she feels she has weakend immune system, due to  having pneumonia as a child 45. PREGNANCY: "Is there any chance you are pregnant?" "When was your last menstrual period?"     *No Answer* 10. OTHER SYMPTOMS: "Do you have any other symptoms?"  (e.g., runny nose, headache, sore throat, loss of smell)      Headache, feels tired  Protocols used: CORONAVIRUS (COVID-19) DIAGNOSED OR SUSPECTED-A-AH  Message from Rutherford Nail, NT sent at 02/08/2019 10:28 AM EDT   Summary: covid 19 questions   Patient calling and states that she has multiple calls regarding COVID 19. States that she was seen in the ER yesterday because she was short of  breath. Did not go into details about the "couple" of questions she had.

## 2019-02-08 NOTE — Telephone Encounter (Signed)
Pt call stated she has had increase SOB since increase in carvedilol, she stated her carvedilol was increase to 2 tablets in the morning making 6.25 mg and since then she seems to very SOB with little activity, pt want to know if she can go back to 1 tablet twice a day

## 2019-02-09 ENCOUNTER — Emergency Department (HOSPITAL_COMMUNITY)
Admission: EM | Admit: 2019-02-09 | Discharge: 2019-02-10 | Disposition: A | Discharge status: Home / Self Care | Payer: Managed Care, Other (non HMO) | Attending: Emergency Medicine | Admitting: Emergency Medicine

## 2019-02-09 ENCOUNTER — Encounter (HOSPITAL_COMMUNITY): Payer: Self-pay

## 2019-02-09 ENCOUNTER — Other Ambulatory Visit: Payer: Self-pay

## 2019-02-09 ENCOUNTER — Telehealth: Payer: Self-pay | Admitting: Family Medicine

## 2019-02-09 DIAGNOSIS — I10 Essential (primary) hypertension: Secondary | ICD-10-CM | POA: Insufficient documentation

## 2019-02-09 DIAGNOSIS — Z79899 Other long term (current) drug therapy: Secondary | ICD-10-CM | POA: Diagnosis not present

## 2019-02-09 DIAGNOSIS — R0602 Shortness of breath: Secondary | ICD-10-CM

## 2019-02-09 DIAGNOSIS — J45909 Unspecified asthma, uncomplicated: Secondary | ICD-10-CM | POA: Insufficient documentation

## 2019-02-09 DIAGNOSIS — E119 Type 2 diabetes mellitus without complications: Secondary | ICD-10-CM | POA: Insufficient documentation

## 2019-02-09 DIAGNOSIS — R197 Diarrhea, unspecified: Secondary | ICD-10-CM | POA: Insufficient documentation

## 2019-02-09 DIAGNOSIS — Z87891 Personal history of nicotine dependence: Secondary | ICD-10-CM | POA: Diagnosis not present

## 2019-02-09 DIAGNOSIS — R05 Cough: Secondary | ICD-10-CM | POA: Insufficient documentation

## 2019-02-09 DIAGNOSIS — Z20828 Contact with and (suspected) exposure to other viral communicable diseases: Secondary | ICD-10-CM | POA: Insufficient documentation

## 2019-02-09 NOTE — ED Provider Notes (Signed)
[facility] EMERGENCY DEPARTMENT Provider Note   CSN: [id number] Arrival date & time: 02/09/19  2207    History   Chief Complaint Chief Complaint  Patient presents with  . Shortness of Breath    HPI Brooke Turner is a 43 y.o. female.     43 year old female with a history of essential hypertension, esophageal reflux, palpitations, chronic chest pain presents to the emergency department for shortness of breath.  She reports persistent symptoms for the past 4 days.  She feels that she is struggling to take a deep breath at times.  She has had a persistent, nonproductive cough.  Feels that her breathing was worse today.  Reports using a home pulse oximetry which read 89%.  Also notes onset of diarrhea x2 this afternoon.  She states this is unusual for her.  She has not had any fever over 100.4 F.  No recent travel, but she does endorse exposure to coworkers who tested positive for COVID-19; states that one of these coworkers passed away.  The patient is worried about her worsening breathing; expresses concern about dying from the virus because of her hx of asthma.  The history is provided by the patient. No language interpreter was used.  Shortness of Breath    Past Medical History:  Diagnosis Date  . Anxiety   . Back pain   . Chest pain    a. 09/2014 Echo: EF 60-65%, no rwma, nl valves;  b. 12/2014 Neg ETT.  . Diabetes mellitus without complication (HCC)    has never taken medications  . Essential hypertension    a. 01/2016 Renal duplex: no RAS- ? renal cyst (renal u/s 4/17 - no cyst, nl study); b. 02/2016 24 hr BP Monitor: Mean SBP ~ 138 with Max of 198. Highest pressures noted in evening hours following placement of cuff.  . Fibroid uterus   . GERD (gastroesophageal reflux disease)   . Palpitations    a. 09/2014 nl event monitor.    Patient Active Problem List   Diagnosis Date Noted  . History of posttraumatic stress disorder (PTSD) 12/31/2018  .  History of panic attacks 12/31/2018  . Palpitations   . Atypical chest pain   . Chronic tension-type headache, not intractable 06/10/2015  . Post-traumatic stress reaction 02/25/2015  . Anxiety disorder due to general medical condition with panic attack 02/25/2015  . Subserous leiomyoma of uterus 02/25/2015  . Anxiety about health 02/25/2015  . Deviated nasal septum 12/30/2014  . Chronic rhinitis 12/30/2014  . Chronic maxillary sinusitis 12/30/2014  . Snorings 09/30/2014  . Prediabetes 08/26/2014  . Acanthosis nigricans 03/19/2014  . Vitamin D deficiency 07/15/2013  . Large breasts 09/12/2012  . Essential hypertension 02/17/2012  . Obesity (BMI 30-39.9) previous BMI was over 40. Patient has lost 70 pounds in the last year 02/17/2012  . Anxiety disorder 02/17/2012  . Chronic back pain 02/17/2012    Past Surgical History:  Procedure Laterality Date  . WISDOM TOOTH EXTRACTION       OB History    Gravida  2   Para  0   Term  0   Preterm  0   AB  2   Living  0     SAB  1   TAB  1   Ectopic  0   Multiple  0   Live Births               Home Medications    Prior to Admission medications  Medication Sig Start Date End Date Taking? Authorizing Provider  carvedilol (COREG) 3.125 MG tablet Take 3.125-6.25 mg by mouth See admin instructions. Take 2 tablets every morning and take 1 tablet every evening   Yes [provider]  famotidine (PEPCID) 20 MG tablet Take 1 tablet (20 mg total) by mouth 2 (two) times daily. 11/20/18  Yes [name], MD  fluticasone [facility]) 50 MCG/ACT nasal spray Place 2 sprays into both nostrils daily. Patient taking differently: Place 2 sprays into both nostrils daily as needed (seasonal allergies).  10/10/17  Yes Timmothy Euler, [place] D, PA-C  LORazepam (ATIVAN) 0.5 MG tablet Take 0.5 tablets (0.25 mg total) by mouth every 6 (six) hours as needed for anxiety. 01/24/19  Yes Forrest Moron, MD  Multiple Vitamins-Minerals  (MULTIVITAMIN GUMMIES ADULT PO) Take 2 tablets by mouth daily.    Yes [provider]  Norethindrone-Ethinyl Estradiol-Fe Biphas (LO LOESTRIN FE) 1 MG-10 MCG / 10 MCG tablet Take 1 tablet by mouth at bedtime.    Yes [provider]  OVER THE COUNTER MEDICATION Take 1 tablet by mouth daily. Vitamin B-12 gummy supplement. 1040mg each gummy.    Yes [provider]  albuterol (PROVENTIL HFA;VENTOLIN HFA) 108 (90 Base) MCG/ACT inhaler Inhale 1-2 puffs into the lungs every 6 (six) hours as needed for wheezing or shortness of breath. 02/07/19   MAlveria Apley PA-C  ALPRAZolam (NIRAVAM) 0.25 MG dissolvable tablet Take 1 tablet (0.25 mg total) by mouth at bedtime as needed for anxiety. Patient not taking: Reported on 02/09/2019 10/01/18   McVey, EGelene Mink PA-C  blood glucose meter kit and supplies Test blood sugar twice daily. Dx code: R73.09 07/22/15   SShawnee Knapp MD  carvedilol (COREG) 3.125 MG tablet Take 2 tablets (6.25 mg total) by mouth every morning AND 1 tablet (3.125 mg total) every evening. Patient not taking: Reported on 02/09/2019 01/28/19   LLendon Colonel NP  glucose blood ([facility]) test strip TEST BLOOD SUGAR DAILY 11/16/18   SShawnee Knapp MD  [facility] LANC[facility] 374BMISC USE TO TEST BLOOD SUGAR DAILY 11/16/18   SShawnee Knapp MD  Tiotropium Bromide Monohydrate (SPIRIVA RESPIMAT) 1.25 MCG/ACT AERS Inhale 2 puffs into the lungs daily. Patient not taking: Reported on 02/09/2019 10/25/18   YBaird LyonsD, MD  triamcinolone cream (KENALOG) 0.1 % Apply 1 application topically 2 (two) times daily. Patient not taking: Reported on 02/09/2019 08/06/18   HPosey Boyer MD    Family History Family History  Problem Relation Age of Onset  . Seizures Mother   . Cancer Father        Liver  . Diabetes Father 640 . Hypertension Father   . Heart disease Father 613      CEA, LE [place]  . Alzheimer's disease Maternal Grandmother   . Heart disease Maternal  Grandfather     Social History Social History   Tobacco Use  . Smoking status: Former Smoker    Packs/day: 0.25    Years: 10.00    Pack years: 2.50    Types: Cigarettes    Start date: 06/25/2000    Last attempt to quit: 08/19/2014    Years since quitting: 4.4  . Smokeless tobacco: Never Used  Substance Use Topics  . Alcohol use: No    Alcohol/week: 0.0 standard drinks  . Drug use: No     Allergies   Penicillins; Tamiflu [oseltamivir phosphate]; Tinidazole; and Diltiazem   Review of Systems  Review of Systems  Respiratory: Positive for shortness of breath.   Ten systems reviewed and are negative for acute change, except as noted in the HPI.    Physical Exam Updated Vital Signs BP (!) 187/114 (BP Location: Right Arm)   Pulse (!) 112   Temp 98.1 F (36.7 C) (Oral)   Resp 19   LMP 03/10/2018 Comment: Takes BCP and per GYN she purposely misses period d/t fibroid  SpO2 100%   Physical Exam Vitals signs and nursing note reviewed.  Constitutional:      General: She is not in acute distress.    Appearance: She is well-developed. She is not diaphoretic.     Comments: Intermittently tearful  HENT:     Head: Normocephalic and atraumatic.     Right Ear: External ear normal.     Left Ear: External ear normal.  Eyes:     General: No scleral icterus.    Conjunctiva/sclera: Conjunctivae normal.  Neck:     Musculoskeletal: Normal range of motion.  Pulmonary:     Effort: Pulmonary effort is normal. No respiratory distress.     Breath sounds: No stridor. No wheezing, rhonchi or rales.     Comments: Respirations even and unlabored.  Lungs clear to auscultation bilaterally. Musculoskeletal: Normal range of motion.  Skin:    General: Skin is warm and dry.     Coloration: Skin is not pale.     Findings: No erythema or rash.  Neurological:     General: No focal deficit present.     Mental Status: She is alert and oriented to person, place, and time.     Coordination:  Coordination normal.     Comments: GCS 15.  Moving all extremities spontaneously.  Psychiatric:        Behavior: Behavior normal.      ED Treatments / Results  Labs (all labs ordered are listed, but only abnormal results are displayed) Labs Reviewed  NOVEL CORONAVIRUS, NAA (HOSPITAL ORDER, SEND-OUT TO REF LAB)    EKG None  Radiology No results found.  Procedures Procedures (including critical care time)  Medications Ordered in ED Medications  albuterol (PROVENTIL HFA;VENTOLIN HFA) 108 (90 Base) MCG/ACT inhaler 2 puff (has no administration in time range)     Initial Impression / Assessment and Plan / ED Course  I have reviewed the triage vital signs and the nursing notes.  Pertinent labs & imaging results that were available during my care of the patient were reviewed by me and considered in my medical decision making (see chart for details).         Brooke Turner was evaluated in Emergency Department on 02/10/2019 for the symptoms described in the history of present illness.  Notes worsening shortness of breath x 4 days with hypoxia of 89% on home pulse oximetry. SpO2 in the ED has been 100% with rest and ambulation.  Lungs CTAB and CXR from 2 days ago without opacities or other acute cardiopulmonary abnormalities.  She does have hx of occupational exposure. Reports numerous coworkers testing positive for coronavirus, 1 who has passed away from the virus. Despite reassuring evaluations and extensive teaching in this ED and at outside facilities, she continues to seek out medical evaluations for her persistent symptoms potentiating exposure for not only herself, but for others around her should she be positive for the virus.  Decision was made to test the patient for coronavirus today in an attempt to mitigate ongoing exposure.   She was evaluated  in the context of the global COVID-19 pandemic, which necessitated consideration that the patient might be at risk for  infection with the SARS-CoV-2 virus that causes COVID-19. Institutional protocols and algorithms that pertain to the evaluation of patients at risk for COVID-19 are in a state of rapid change based on information released by regulatory bodies including the CDC and federal and state organizations. These policies and algorithms were followed during the patient's care in the ED.  The patient was discharged from this hospital in satisfactory and stable condition.  All questions answered prior to discharge.   Final Clinical Impressions(s) / ED Diagnoses   Final diagnoses:  SOB (shortness of breath)    ED Discharge Orders    None       Antonietta Breach, PA-C 02/10/19 0019    Hayden Rasmussen, MD 02/10/19 1100

## 2019-02-09 NOTE — ED Notes (Signed)
Sats 100% RA while ambulating with pulse ox. Claiborne Billings PA aware

## 2019-02-09 NOTE — ED Triage Notes (Signed)
Pt seen at [facility] 02-07-19 for shortness of breath and cough.  Pts coworker tested positive for CoviD 19 and passed away.  Pt last seen the coworker 1 1/2 to 2 weeks ago.  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  Another coworker dx with pneumonia last week.    Pt talking in complete sentences.  Pt endorses breathing is worse.  Diarrhea x 2.  No fever.

## 2019-02-09 NOTE — Telephone Encounter (Signed)
Call received at approximately 8:15 PM from answering service nurse.  She spoke with patient who was experiencing cough and increasing shortness of breath the past 3 days.  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  Apparently also has had some nausea/vomiting and diarrhea.  She had reported to the nurse that she is having worsening shortness of breath compared to her recent ER visit few days ago.  Decision was made to have further evaluation through emergency room tonight.  Agree with eval..

## 2019-02-10 MED ORDER — ALBUTEROL SULFATE HFA 108 (90 BASE) MCG/ACT IN AERS
2.0000 | INHALATION_SPRAY | Freq: Once | RESPIRATORY_TRACT | Status: AC
  Administered 2019-02-10: 01:00:00 2 via RESPIRATORY_TRACT
  Filled 2019-02-10: qty 6.7

## 2019-02-10 NOTE — Telephone Encounter (Signed)
Yes she can reduce the dose back down to 6.25 mg BID.

## 2019-02-10 NOTE — Discharge Instructions (Addendum)
Your exam the emergency department was reassuring.  Your oxygen saturations were 100% while in the ED.  Prior chest Xray did not suggest infection or concerning heart or lung issues.  Given your exposure to coronavirus at your occupation, you were tested for this virus today.  We recommend that you continue self quarantine until you receive the results of this test.  If positive, continue quarantine for a total of 14 days and/or until you have been symptom free for 3 days. You may try using 1 to 2 puffs of an albuterol inhaler every 6 hours for management of persistent shortness of breath.  Follow-up with your primary care doctor to ensure resolution of symptoms.  Return to the ED for new or concerning symptoms.

## 2019-02-10 NOTE — ED Notes (Signed)
E-signature not available, verbalized understanding of DC instructions. 

## 2019-02-11 ENCOUNTER — Other Ambulatory Visit: Payer: Self-pay

## 2019-02-11 ENCOUNTER — Encounter: Payer: Self-pay | Admitting: Internal Medicine

## 2019-02-11 ENCOUNTER — Ambulatory Visit (INDEPENDENT_AMBULATORY_CARE_PROVIDER_SITE_OTHER): Payer: Managed Care, Other (non HMO) | Admitting: Internal Medicine

## 2019-02-11 DIAGNOSIS — R0600 Dyspnea, unspecified: Secondary | ICD-10-CM | POA: Diagnosis not present

## 2019-02-11 LAB — NOVEL CORONAVIRUS, NAA (HOSP ORDER, SEND-OUT TO REF LAB; TAT 18-24 HRS): SARS-CoV-2, NAA: NOT DETECTED

## 2019-02-11 NOTE — Progress Notes (Signed)
Virtual Visit via Telephone Note  I connected with Brooke Turner on 11/57/26 at  4:00 PM EDT by telephone and verified that I am speaking with the correct person using two identifiers.   I discussed the limitations, risks, security and privacy concerns of performing an evaluation and management service by telephone and the availability of in person appointments. I also discussed with the patient that there may be a patient responsible charge related to this service. The patient expressed understanding and agreed to proceed.   History of Present Illness: 43 yo F former smoker continuing to be concerned that breathing is somewhat more labored than normal. Previous concern for OSA ruled out. Has been evaluated by cardiology.Some dry cough without fever, chills, diaphoresis. Eval for Covid neg at ER recently. Tried 2 puffs of Spiriva sample after which she immediately noted "a couple of chest pains" so she hasn't tried it again.    Observations/Objective: Multiple CXRs and a CT chest unrevealing this winter. PFT is scheduled. Cardiology f/u pending. Not anemic as of 3/19. D-dimer 0.31.  Speech pattern was normal with calm, unlabored breathing noted during our phone conversation.  Assessment and Plan:   Follow Up Instructions:    I discussed the assessment and treatment plan with the patient. The patient was provided an opportunity to ask questions and all were answered. The patient agreed with the plan and demonstrated an understanding of the instructions.   The patient was advised to call back or seek an in-person evaluation if the symptoms worsen or if the condition fails to improve as anticipated.  I provided 23 minutes of non-face-to-face time during this encounter.   Baird Lyons, MD

## 2019-02-11 NOTE — Telephone Encounter (Signed)
Televisit with CY made per patient request. Patient is having sob . Recent hospital visit due to sob,  covid-19 test was negative. Nothing further needed.

## 2019-02-11 NOTE — Assessment & Plan Note (Addendum)
She is concerned breathing might be abnormal. My impression is anxiety as no objective problems have been identified. Possibly she is feeling some effect from her Beta-blocker. Possibly some subtle impact from spring pollens, although she hasn't considered herself an allergic person.  Plan- keep PFT appointment, ABG on room air, retry Spiriva 1 puff, once daily. Consider cardiopulmonary stress test in future.

## 2019-02-11 NOTE — Telephone Encounter (Signed)
Leave message for pt to call back 

## 2019-02-11 NOTE — Patient Instructions (Addendum)
We are looking for reasons why you may feel your breathing is more difficult than normal Possibly you are feeling some effect of your beta-blocker BP med, or the spring pollens. I do not think there is likely to be anything seriously wrong medically. We will go ahead with the pulmonary function test (PFT) as scheduled. We will order an arterial blood gas test (ABG) to directly measure your oxygen and carbon dioxide levels. Please keep your pending appointments with [place] [facility], and with your Cardiologist.  You may retry your Spiriva- just 1 puff per day at first, to see if it is easier this time.

## 2019-02-12 NOTE — Telephone Encounter (Signed)
Spoke with pt and give Brooke Turner recommendation, pt voice understanding and thanks

## 2019-02-14 ENCOUNTER — Telehealth: Payer: Self-pay | Admitting: Internal Medicine

## 2019-02-14 NOTE — Telephone Encounter (Signed)
Called and spoke with pt and stated to her either one of the humidifier or the air purifier would be beneficial for her to help with her asthma but stated to pt with the humidifier being a cool mist humidifier, that might work good for her as a lot of people that have asthma do better with the cool. Pt expressed understanding. Nothing further needed.

## 2019-02-14 NOTE — Telephone Encounter (Signed)
Patient would like to know which one would be good for breathing issues a cool mist humidifier or air purifier.  May leave answer to question on voicemail.  Patient phone number is [phone].

## 2019-02-14 NOTE — Telephone Encounter (Signed)
LMTCB

## 2019-02-15 ENCOUNTER — Telehealth: Payer: Self-pay | Admitting: Internal Medicine

## 2019-02-15 NOTE — Telephone Encounter (Signed)
LVM for return call. 

## 2019-02-15 NOTE — Telephone Encounter (Signed)
New Message    Pt is returning call about her upcoming appts    Please call back

## 2019-02-16 ENCOUNTER — Other Ambulatory Visit: Payer: Self-pay | Admitting: Pulmonary Disease

## 2019-02-16 MED ORDER — TIOTROPIUM BROMIDE MONOHYDRATE 1.25 MCG/ACT IN AERS: 2.0000 | INHALATION_SPRAY | Freq: Every day | RESPIRATORY_TRACT | 0 refills | Status: DC

## 2019-02-16 NOTE — Progress Notes (Signed)
Spiriva Respimat 1.25 mcg 2 puffs daily ordered at patient's requested pharmacy.

## 2019-02-18 ENCOUNTER — Other Ambulatory Visit: Payer: Self-pay

## 2019-02-18 ENCOUNTER — Telehealth: Payer: Self-pay | Admitting: Internal Medicine

## 2019-02-18 ENCOUNTER — Telehealth: Payer: Self-pay | Admitting: Cardiology

## 2019-02-18 ENCOUNTER — Telehealth (INDEPENDENT_AMBULATORY_CARE_PROVIDER_SITE_OTHER): Payer: Managed Care, Other (non HMO) | Admitting: Internal Medicine

## 2019-02-18 VITALS — HR 110 | Ht 64.0 in | Wt 164.0 lb

## 2019-02-18 DIAGNOSIS — I519 Heart disease, unspecified: Secondary | ICD-10-CM

## 2019-02-18 DIAGNOSIS — F419 Anxiety disorder, unspecified: Secondary | ICD-10-CM | POA: Diagnosis not present

## 2019-02-18 DIAGNOSIS — I10 Essential (primary) hypertension: Secondary | ICD-10-CM

## 2019-02-18 DIAGNOSIS — R000 Tachycardia, unspecified: Secondary | ICD-10-CM

## 2019-02-18 DIAGNOSIS — R002 Palpitations: Secondary | ICD-10-CM

## 2019-02-18 DIAGNOSIS — Z7189 Other specified counseling: Secondary | ICD-10-CM

## 2019-02-18 DIAGNOSIS — R55 Syncope and collapse: Secondary | ICD-10-CM

## 2019-02-18 DIAGNOSIS — Z79899 Other long term (current) drug therapy: Secondary | ICD-10-CM

## 2019-02-18 MED ORDER — ATENOLOL 25 MG PO TABS: 25.0000 mg | ORAL_TABLET | Freq: Every day | ORAL | 3 refills | Status: DC

## 2019-02-18 NOTE — Telephone Encounter (Signed)
New Message           Patient is calling today about her heart monitor being placed on her on the 15th. Patient wants to know if she is still getting the monitor. Pls call to advise.

## 2019-02-18 NOTE — Telephone Encounter (Signed)
Follow up  ° ° °Pt is returning call  ° ° °Please call back  °

## 2019-02-18 NOTE — Telephone Encounter (Signed)
Pt calling to clarify medication changes made today by Dr Caryl Comes. She states she took her carvedilol this morning and her HR remains in the low 100's, high 90's. Pt is very anxious and has recently started taking lorazepam for anxiety management. She takes her HR frequently throughout the day, which she states usually elevates it more.   Pt aware that her prescription was sent in today for her atenolol. Dr Caryl Comes advised her to begin 12.5mg  qhs x 2 weeks, then a full 25mg  qd. She wants to know if she can take it in the morning. I advised her this would be ok, however Dr. Caryl Comes may of wanted her to take it in the evening to lessen the effect of orthostatic intolerance. She wants to know if she can begin tonight since she does not think the carvedilol will help bring her heart rate down. She feels she will need to go to the hospital if her HR does not decrease. I advised her it would be ok for her to start her 12.5mg  atenolol this evening if she believes it will help her HR to become more comfortable for her. I encouraged her to manage self care and relax to help lower her anxiety levels, as this will most likely decrease her HR in the best way. She is extremely anxious and wonders if she should start taking a higher dose of lorazepam. I advised her to speak with the provider who prescribed it for her.

## 2019-02-18 NOTE — Telephone Encounter (Signed)
Spoke with pt who was inquiring about her appointment to have monitor placed. Informed pt that I will have some one from that department contact her.

## 2019-02-18 NOTE — Progress Notes (Signed)
Electrophysiology TeleHealth Note   Due to national recommendations of social distancing due to COVID 19, an audio/video telehealth visit is felt to be most appropriate for this patient at this time.  See MyChart message from today for the patient's consent to telehealth for [facility].   Date:  02/18/2019   ID:  Brooke Turner, DOB [id number], MRN [id number]  Location: patient's home  Provider location: [address], [place]  Evaluation Performed: Initial Evaluation  PCP:  Deneise Lever, MD  Cardiologist:  Minus Breeding, MD   Electrophysiologist:  None   Chief Complaint: dizziness and chest pain   History of Present Illness:    Brooke Turner is a 43 y.o. female who presents via audio/video conferencing for a telehealth visit today for  Above complaints       Longstanding Hx of tachypalps more recently assoc with episodic presyncope>> BP 160/90 ++  Rx w lorazapam   Heat Intolerance- no ; Shower Intolerance -yes , Orthostatic intolerance some, Menses Intolerance no  Diet-- Fluid  replete; Salt deplete  Symptoms are often noted first at rest, showers in the am,   N+ need to defecate,   Improved with low dose BB, but increased SOB with higher doses   Prev BB-- atenolol; propranolol ( in addition to atenolol)   BP at home 120/80>>>   3/17  Urine VMA--normal 3/17 BP ambulatory 10/18 Echo- stress  Normal   Hypertensive HR response   HR 102>>173 Date Cr K Hgb TSH  12/19    0.933  3/20 1.21 3.9 14.2     Orthostatic Vs 1/17, 12,/19 x 3 all normal apart from hypertension   1/20 Event Recorder personnally reviewed  >>   chest pain/pressure   Sinus  Lightheadedness  Sinus   Flutters  Sinus +/- PAC PVCs infrequent    The patient denies symptoms of fevers, chills, cough, or new SOB worrisome for COVID 19.    Past Medical History:  Diagnosis Date  . Anxiety   . Back pain   . Chest pain    a. 09/2014 Echo: EF 60-65%, no  rwma, nl valves;  b. 12/2014 Neg ETT.  . Diabetes mellitus without complication (HCC)    has never taken medications  . Essential hypertension    a. 01/2016 Renal duplex: no RAS- ? renal cyst (renal u/s 4/17 - no cyst, nl study); b. 02/2016 24 hr BP Monitor: Mean SBP ~ 138 with Max of 198. Highest pressures noted in evening hours following placement of cuff.  . Fibroid uterus   . GERD (gastroesophageal reflux disease)   . Palpitations    a. 09/2014 nl event monitor.    Past Surgical History:  Procedure Laterality Date  . WISDOM TOOTH EXTRACTION      Current Outpatient Medications  Medication Sig Dispense Refill  . blood glucose meter kit and supplies Test blood sugar twice daily. Dx code: R73.09 1 each 0  . carvedilol (COREG) 3.125 MG tablet Take 2 tablets (6.25 mg total) by mouth every morning AND 1 tablet (3.125 mg total) every evening. (Patient taking differently: Take 1 tablet by mouth every morning AND 1 tablet (3.125 mg total) every evening.) 180 tablet 1  . famotidine (PEPCID) 20 MG tablet Take 1 tablet (20 mg total) by mouth 2 (two) times daily. 180 tablet 0  . fluticasone (FLONASE) 50 MCG/ACT nasal spray Place 2 sprays into both nostrils daily. (Patient taking differently: Place 2 sprays into both nostrils  daily as needed (seasonal allergies). ) 16 g 0  . glucose blood (ONETOUCH VERIO) test strip TEST BLOOD SUGAR DAILY 25 each 10  . LORazepam (ATIVAN) 0.5 MG tablet Take 0.5 tablets (0.25 mg total) by mouth every 6 (six) hours as needed for anxiety. 30 tablet 0  . Multiple Vitamins-Minerals (MULTIVITAMIN GUMMIES ADULT PO) Take 2 tablets by mouth daily.     . Norethindrone-Ethinyl Estradiol-Fe Biphas (LO LOESTRIN FE) 1 MG-10 MCG / 10 MCG tablet Take 1 tablet by mouth at bedtime.     [name] DELICA LANCETS 28M MISC USE TO TEST BLOOD SUGAR DAILY 100 each 3  . OVER THE COUNTER MEDICATION Take 1 tablet by mouth daily. Vitamin B-12 gummy supplement. 1060mg each gummy.     . Tiotropium  Bromide Monohydrate (SPIRIVA RESPIMAT) 1.25 MCG/ACT AERS Inhale 2 puffs into the lungs daily. 1 Inhaler 0  . albuterol (PROVENTIL HFA;VENTOLIN HFA) 108 (90 Base) MCG/ACT inhaler Inhale 1-2 puffs into the lungs every 6 (six) hours as needed for wheezing or shortness of breath. (Patient not taking: Reported on 02/11/2019) 1 Inhaler 0  . ALPRAZolam (NIRAVAM) 0.25 MG dissolvable tablet Take 1 tablet (0.25 mg total) by mouth at bedtime as needed for anxiety. (Patient not taking: Reported on 02/18/2019) 20 tablet 1   Current Facility-Administered Medications  Medication Dose Route Frequency Provider Last Rate Last Dose  . cyanocobalamin ((VITAMIN B-12)) injection 1,000 mcg  1,000 mcg Intramuscular Q30 days SShawnee Knapp MD   1,000 mcg at 10/19/18 1837    Allergies:   Penicillins; Tamiflu [oseltamivir phosphate]; Tinidazole; and Diltiazem   Social History:  The patient  reports that she quit smoking about 4 years ago. Her smoking use included cigarettes. She started smoking about 18 years ago. She has a 2.50 pack-year smoking history. She has never used smokeless tobacco. She reports that she does not drink alcohol or use drugs.   Family History:  The patient's   family history includes Alzheimer's disease in her maternal grandmother; Cancer in her father; Diabetes (age of onset: 683 in her father; Heart disease in her maternal grandfather; Heart disease (age of onset: 638 in her father; Hypertension in her father; Seizures in her mother.   ROS:  Please see the history of present illness.   All other systems are personally reviewed and negative.    Exam:    Vital Signs:  Pulse (!) 110   Ht _0  (1.626 m)   Wt 164 lb (74.4 kg)   BMI 28.15 kg/m     Well appearing, alert and conversant, regular work of breathing,  good skin color Eyes- anicteric, neuro- grossly intact, skin- no apparent rash or lesions or cyanosis, mouth- oral mucosa is pink   Labs/Other Tests and Data Reviewed:    Recent Labs:  10/08/2018: B Natriuretic Peptide 36.0 10/18/2018: Magnesium 2.1; TSH 0.933 01/24/2019: ALT 15; BUN 17; Creatinine, Ser 1.21; Hemoglobin 14.2; Platelets 271; Potassium 3.9; Sodium 138   Wt Readings from Last 3 Encounters:  02/18/19 164 lb (74.4 kg)  02/07/19 167 lb 8.8 oz (76 kg)  01/30/19 167 lb 9.6 oz (76 kg)     Other studies personally reviewed: Additional studies/ records that were reviewed today include:  As above       ASSESSMENT & PLAN:    Sinus tachycardia  Hypertension  Anxiety  Presyncope   She has had multiple emergency room visits for chest pain and tachypalpitations.  At this point there is no evidence that there is  myocardial/primary arrhythmic disease.  Event recorder demonstrated no abrupt changes in heart rate to suggest an atrial tachycardia as a potential trigger, not withstanding her observation that many of these events occur spontaneously.  It may be that there is antecedent change in heart rate/P waves that would be clarifying for which we do not have data.  She has significant orthostatic hypertension without evidence of orthostatic tachycardia.  Event recorder demonstrates sinus tachycardia with her chest pain syndromes; this suggests that she is adrenergic Lee hypersensitive.  Catecholamine assessments a few years ago were negative.  She recalls having been better on atenolol.  Have suggested we try a selective beta-blocker so as to avoid the dyspnea complaints which she has ascribed to up titration of her carvedilol.  She has taken atenolol in the past so we will resume it.  She would like to start a 12.5 mg daily and thought that we could increase it in a week or 2 if she tolerated it well.  Suggested she take her showers at night and not in the morning  Suggested also that the anxiety component of this is major either in a primary way or aggravating weight.  Multiple ER visits over many years Highlights the chronicity and agree with Dr. [facility] assessment  that attention to her neuropsychiatric/emotional condition will be an important part of her overall wellbeing.  Have suggested she ask her PCP for consideration of an SSRI particularly to help with mood stabilization and her chronic anxiety.  She is ongoingly engaged in counseling.  Has not seen much benefit.   COVID 19 screen The patient denies symptoms of COVID 19 at this time.  The importance of social distancing was discussed today.  Follow-up:  67m Next remote:   Current medicines are reviewed at length with the patient today.   The patient does not have concerns regarding her medicines.  The following changes were made today:  As above   Begin atenolol 25 mg  1/2 tab daily qhs x 2 weeks Then increase to 25 mg daily   Labs/ tests ordered today include  No orders of the defined types were placed in this encounter.    Patient Risk:  after full review of this patients clinical status, I feel that they are at moderate risk at this time.  Today, I have spent 62  minutes with the patient with telehealth technology discussing As above .    Signed, SVirl Axe MD  02/18/2019 1:03 PM     [place] StreetSLibertyGSchooner Bay[place] [phone](office) ([phone](fax)

## 2019-02-18 NOTE — Telephone Encounter (Signed)
Left message to call back  

## 2019-02-18 NOTE — Telephone Encounter (Signed)
Follow Up:    Pt said she had app this morning. She wanted to talkto you about her medicine.

## 2019-02-18 NOTE — Addendum Note (Signed)
Addended by: Dollene Primrose on: 02/18/2019 02:27 PM   Modules accepted: Orders

## 2019-02-19 ENCOUNTER — Telehealth: Payer: Self-pay | Admitting: Internal Medicine

## 2019-02-19 ENCOUNTER — Other Ambulatory Visit: Payer: Self-pay | Admitting: Adult Health

## 2019-02-19 ENCOUNTER — Telehealth: Payer: Self-pay | Admitting: Radiology

## 2019-02-19 DIAGNOSIS — R000 Tachycardia, unspecified: Secondary | ICD-10-CM

## 2019-02-19 DIAGNOSIS — R42 Dizziness and giddiness: Secondary | ICD-10-CM

## 2019-02-19 NOTE — Telephone Encounter (Signed)
Pt c/o medication issue:  1. Name of Medication:  atenolol (TENORMIN) 25 MG tablet  2. How are you currently taking this medication (dosage and times per day)? 12.5 mg   3. Are you having a reaction (difficulty breathing--STAT)? Heavy breathing. Lightheaded and feeling flushed. Overall uneasy feeling   4. What is your medication issue? Pt says breathing is heavier, more labored, harder to take a deep breath. Medication is bring HR too low (61) BP is lower too (127/85)

## 2019-02-19 NOTE — Telephone Encounter (Signed)
Spoke with patient monitor has been ordered and will be mailed

## 2019-02-19 NOTE — Telephone Encounter (Signed)
Enrolled patient for a 30 day [employer] monitor to be mailed due to Covid-19. Instructions were gone over with the patient and she knows to expect the monitor is 3-4 days.  *original order was for Zio 30 days. Zio is generally only for up to 14 days. Patient also stated she has sensitive skin. Changed order to Event Monitor also ordered sensitive skin patches.

## 2019-02-19 NOTE — Telephone Encounter (Signed)
Tried to call back  Left VM

## 2019-02-19 NOTE — Telephone Encounter (Signed)
Video visit with Brooke Turner yesteday.  Was on carvedilol for over 2 years. 3.125 BId Trying to get BP lower, am only dose was increased. About a week later she noticed a "breathing issue"   Was told to go back to usual dose and see if helps. Got a little better - slight difference in breathing but:   Atenolol was started yesterday. Coreg dc'd.   Atenolol 12.5. mg taken as first dose last night. Woke up today, feeling lightheaded all day, flushed, face is warm. Not getting a full breath, lightheaded, more effort to breathe. Worse than with carvedilol. HR is high 50s-low 60s.  BP 127/88, 127/83.   She wants to know if she could try a lower dose of atenolol or go back to carvedilol  She is nervous and this she is afraid is affecting her Bp. Reassured her that BP and HR are ok.  She denies fevers, chills, any other respiratory concerns.   Aware I am sending to Dr. Caryl Comes and his primary nurse to call back with recommendations.

## 2019-02-20 ENCOUNTER — Telehealth: Payer: Self-pay | Admitting: Internal Medicine

## 2019-02-20 NOTE — Telephone Encounter (Signed)
Dr. Caryl Comes tried to contact pt about previous phone note.  Will route to Dr. Caryl Comes.

## 2019-02-20 NOTE — Telephone Encounter (Signed)
Follow up   Patient is returning call. She can be reached between 1:45 and 2:00pm.

## 2019-02-20 NOTE — Telephone Encounter (Signed)
New Message:   Patient states some one called her today. Please call patient back.

## 2019-02-21 ENCOUNTER — Ambulatory Visit: Payer: Managed Care, Other (non HMO) | Admitting: Cardiology

## 2019-02-21 ENCOUNTER — Telehealth: Payer: Self-pay | Admitting: Student

## 2019-02-21 ENCOUNTER — Ambulatory Visit (INDEPENDENT_AMBULATORY_CARE_PROVIDER_SITE_OTHER): Payer: Managed Care, Other (non HMO)

## 2019-02-21 ENCOUNTER — Telehealth: Payer: Self-pay | Admitting: Family Medicine

## 2019-02-21 DIAGNOSIS — R42 Dizziness and giddiness: Secondary | ICD-10-CM | POA: Diagnosis not present

## 2019-02-21 DIAGNOSIS — R000 Tachycardia, unspecified: Secondary | ICD-10-CM | POA: Diagnosis not present

## 2019-02-21 NOTE — Telephone Encounter (Signed)
   Received page from answering service about patient "not sure if she should go to the hospital or note. She called the office earlier but has not heart back." Per chart review, patient has called the office multiple times in the past couple of days about palpitations with exertion. Per phone note earlier today, Dr. Caryl Comes said he would contact patient regarding her concerns. I personally discussed the patient with Dr. Caryl Comes, who asked for her number and said he would return the call.  Darreld Mclean, PA-C 02/21/2019 8:23 PM

## 2019-02-21 NOTE — Telephone Encounter (Signed)
Patient is calling about her medications. She states she missed a called.  She is still feel weird.  Feeling lightheaded and her heart is beating fast, she states it was over 100.

## 2019-02-21 NOTE — Telephone Encounter (Signed)
Copied from [place] [id number]. Topic: Referral - Question >> Feb 21, 2019 12:42 PM Richardo Priest, NT wrote: Reason for CRM: Patient is requesting a referral for a specialist. States she does not want to give me any information, wants to speak with a provider or nurse directly to give them this information that must be done ASAP. Requesting call back at [phone].

## 2019-02-21 NOTE — Telephone Encounter (Signed)
Follow Up:   Pt calling, says she called earlier to report her heart rate was extremely high and heart beating so hard. She also had chest pain when hear trate started going down.Marland Kitchen

## 2019-02-21 NOTE — Telephone Encounter (Addendum)
Pt reports that her "breathing is winded", and that this has worsened since starting Atenolol. She is trying to do stuff around the house and in yard but every time she exerts herself her "heart goes so fast, and I get so winded". States that she was outside today trying to "saw off some branches", reports she was not going fast, but that her HR was elevated (she does not know how high) and she got SOB.  States "I thought I was going to pass out". Reports highest HRs 130s. Reports sharp/burning CP during occurrences. Advised pt not to exert herself further until discussion with physician. Spoke to [facility] nurse.  Pt made aware Dr. Caryl Comes would attempt to call her back today to discuss further.

## 2019-02-21 NOTE — Telephone Encounter (Signed)
Per Dr Caryl Comes, he will contact pt regarding her concerns.

## 2019-02-22 NOTE — Telephone Encounter (Signed)
I have called the pt and she stated that she wanted a referral for physiatry. Then she stated that she is looking around for one that she doesn't need to pay an entry fee for. She will let us know if she needs a referral placed.

## 2019-02-25 ENCOUNTER — Telehealth: Payer: Self-pay | Admitting: Family Medicine

## 2019-02-25 ENCOUNTER — Telehealth: Payer: Self-pay | Admitting: Internal Medicine

## 2019-02-25 DIAGNOSIS — R0789 Other chest pain: Principal | ICD-10-CM

## 2019-02-25 DIAGNOSIS — R079 Chest pain, unspecified: Secondary | ICD-10-CM

## 2019-02-25 NOTE — Telephone Encounter (Signed)
Copied from [place] [phone]. Topic: Referral - Question >> Feb 21, 2019 12:42 PM Richardo Priest, NT wrote: Reason for CRM: Patient is requesting a referral for a specialist. States she does not want to give me any information, wants to speak with a provider or nurse directly to give them this information that must be done ASAP. Requesting call back at [phone]. >> Feb 22, 2019  1:03 PM Stovall, Edwena Blow A wrote: Pt called back in and has additional questions about her Heart meds.  She does have a Cardo Dr but she is still wanting to speak with her primary.  She would not provide any additional info.  She is just requesting a call back  Best number [phone]

## 2019-02-25 NOTE — Telephone Encounter (Signed)
New message   Patient has questions about setting up a CT Scan and has questions about the Ct scan also. Please call.

## 2019-02-26 ENCOUNTER — Telehealth: Payer: Self-pay | Admitting: Internal Medicine

## 2019-02-26 MED ORDER — TIOTROPIUM BROMIDE MONOHYDRATE 1.25 MCG/ACT IN AERS: 2.0000 | INHALATION_SPRAY | Freq: Every day | RESPIRATORY_TRACT | 5 refills | Status: DC

## 2019-02-26 NOTE — Telephone Encounter (Signed)
Spoke with Pt and [facility] regarding concerns of palpitations and chest pain, specifically suspect this is noncardiac chest pain and how do we find enough assurance so as to foster confidence for her so as to avoid the need for repeated trips to ER for the same  She is agreeable to the plan Taking atenolol now at night, with some slow HR in the AM>> switch to AM dosing Continued shower intolerance, does better with PM showers and cooler water Scheduled for CT for lungs, will look into whether there is a way to combine the scan with CTA

## 2019-02-26 NOTE — Telephone Encounter (Signed)
Called and spoke with pt letting her know the Spiriva Rx has been sent to her pharmacy for her. Pt expressed understanding. Nothing further needed.

## 2019-02-27 NOTE — Telephone Encounter (Signed)
Pt states she does not need referral at this time.  Will contact clinic again if referral is needed.  Also states Brigitte Pulse is her PCP.  Advised that she will need to [facility] and have visit with another provider to continue receiving refills, etc. Pt states she will transfer to Dr. Nolon Rod. Updated in [place].

## 2019-02-27 NOTE — Telephone Encounter (Signed)
Spoke with patinet last night  She is agreeable and will wait Dr [facility] has recommended CTA   Doing better on atenolol

## 2019-02-28 ENCOUNTER — Other Ambulatory Visit: Payer: Self-pay

## 2019-02-28 ENCOUNTER — Telehealth: Payer: Self-pay | Admitting: Internal Medicine

## 2019-02-28 ENCOUNTER — Other Ambulatory Visit (INDEPENDENT_AMBULATORY_CARE_PROVIDER_SITE_OTHER): Payer: Managed Care, Other (non HMO)

## 2019-02-28 DIAGNOSIS — R0609 Other forms of dyspnea: Secondary | ICD-10-CM

## 2019-02-28 DIAGNOSIS — R0600 Dyspnea, unspecified: Secondary | ICD-10-CM

## 2019-02-28 LAB — CBC WITH DIFFERENTIAL/PLATELET
Basophils Absolute: 0 K/uL (ref 0.0–0.1)
Basophils Relative: 0.5 % (ref 0.0–3.0)
Eosinophils Absolute: 0 K/uL (ref 0.0–0.7)
Eosinophils Relative: 0.3 % (ref 0.0–5.0)
HCT: 40.2 % (ref 36.0–46.0)
Hemoglobin: 13.9 g/dL (ref 12.0–15.0)
Lymphocytes Relative: 40 % (ref 12.0–46.0)
Lymphs Abs: 2.3 K/uL (ref 0.7–4.0)
MCHC: 34.6 g/dL (ref 30.0–36.0)
MCV: 90.1 fl (ref 78.0–100.0)
Monocytes Absolute: 0.4 K/uL (ref 0.1–1.0)
Monocytes Relative: 7.3 % (ref 3.0–12.0)
Neutro Abs: 3 K/uL (ref 1.4–7.7)
Neutrophils Relative %: 51.9 % (ref 43.0–77.0)
Platelets: 236 K/uL (ref 150.0–400.0)
RBC: 4.46 Mil/uL (ref 3.87–5.11)
RDW: 13 % (ref 11.5–15.5)
WBC: 5.7 K/uL (ref 4.0–10.5)

## 2019-02-28 LAB — COMPREHENSIVE METABOLIC PANEL WITH GFR
ALT: 13 U/L (ref 0–35)
AST: 16 U/L (ref 0–37)
Albumin: 4.1 g/dL (ref 3.5–5.2)
Alkaline Phosphatase: 36 U/L — ABNORMAL LOW (ref 39–117)
BUN: 12 mg/dL (ref 6–23)
CO2: 26 meq/L (ref 19–32)
Calcium: 8.4 mg/dL (ref 8.4–10.5)
Chloride: 105 meq/L (ref 96–112)
Creatinine, Ser: 1.03 mg/dL (ref 0.40–1.20)
GFR: 70.52 mL/min
Glucose, Bld: 97 mg/dL (ref 70–99)
Potassium: 3.8 meq/L (ref 3.5–5.1)
Sodium: 139 meq/L (ref 135–145)
Total Bilirubin: 1.3 mg/dL — ABNORMAL HIGH (ref 0.2–1.2)
Total Protein: 6.9 g/dL (ref 6.0–8.3)

## 2019-02-28 LAB — SEDIMENTATION RATE: Sed Rate: 3 mm/h (ref 0–20)

## 2019-02-28 NOTE — Telephone Encounter (Signed)
Dr. Annamaria Boots, please advise on the lab results for pt. Thanks!

## 2019-02-28 NOTE — Telephone Encounter (Signed)
Pt states she 'woke up feeling out of it and weird' and lethargic. Per pt, she has had 'breathing issue for the past 3 and 1/2 weeks' and 'felt out of it like blood isn't circulating correctly'. She also states that she felt like she was not getting enough oxygen and that blood clots run in her family so she was concerned and did contact her pulmonologist's office.  Per pt, when she got up to use the bathroom, all of a sudden, she felt her heart jump and she started having palpitations. She checked her HR with pulse ox and it was 145 which was concerning to her because while on atenolol her HR has been 'in the low 60's even 50's'. This episode lasted for about 5 min and occurred b/n 7:30-8:30 this AM. Pt states she took her atenolol a little while after the episode. Pt reports current HR in the 80's. She would like Dr. Percival Spanish and/or Dr. Caryl Comes to review the recordings during that episode. She states that the last time she contacted the cardiac monitor company, they advised her to reach out to [facility] for the recordings to be reviewed so that is why she is calling. Per pt, she has been able to contact office before and get recordings to be reviewed by card. Informed pt that message would be routed to see if recordings can be made available for review. Pt agreeable with this.

## 2019-02-28 NOTE — Telephone Encounter (Signed)
Call returned to patient, made aware of CY recommendations. Voiced understanding. Lab orders placed. Patient to come in office for lab work today. Informed to come before 12 or after 2 in case lunch needed to be taken early or late. Voiced understanding. Nothing further needed at this time.

## 2019-02-28 NOTE — Telephone Encounter (Signed)
Call returned to patient, she states she is having increased back pain and SOB and she is worried because her family has a hx of pulmonary blood clots. She has a history of elevated DVT. She reports being SOB for 3 weeks. Her heart rate 150. She reports feeling uncomfortable and weird. She has been using her Symbicort daily. Reports having intermittent chest pain. She also reports right lung pain. States she has already been tested for COVID and it was negative. She is requesting a D-dimer as well as something be called in. I explained typically I would just put her on the schedule for a Tele-visit but I would touch bases with a provider in case she needed a in office visit or needed to go to ER due to her HX. Denies fever. Denies being around anyone sick. Denies use of spiriva.   BM please advise.

## 2019-02-28 NOTE — Telephone Encounter (Signed)
Returned call to patient to inform her that someone will notify her as soon as labs are resulted and reviewed by Dr. Annamaria Boots.  Patient acknowledged understanding.

## 2019-02-28 NOTE — Telephone Encounter (Signed)
If the strips are available tomorrow AM am glad to review, o/w will be largely unavailable for a few days  Suspect this will be sinus tach

## 2019-02-28 NOTE — Telephone Encounter (Signed)
CY - please advise. Thanks! 

## 2019-02-28 NOTE — Telephone Encounter (Signed)
New Message   Patient is calling because she is wearing a holter monitor. She states that she just had an episode about 5 minutes ago and was told to call when she has an episode so that the monitor can be checked. Please call to discuss.

## 2019-02-28 NOTE — Telephone Encounter (Signed)
Offer outpatient labs- D-dimer, CBC w diff, CMET, ANA, Sed rate   For dx dyspnea  Recommend Aspirin or Ibuprofen and heating pad for the chest pains

## 2019-02-28 NOTE — Telephone Encounter (Signed)
Please route to CY as he is in office and she is his patient.   Aaron Edelman

## 2019-03-04 ENCOUNTER — Telehealth: Payer: Self-pay | Admitting: Emergency Medicine

## 2019-03-04 ENCOUNTER — Telehealth: Payer: Self-pay

## 2019-03-04 ENCOUNTER — Telehealth: Payer: Self-pay | Admitting: Internal Medicine

## 2019-03-04 LAB — D-DIMER, QUANTITATIVE: D-Dimer, Quant: 0.26 mcg/mL FEU (ref <0.50)

## 2019-03-04 LAB — ANA: Anti Nuclear Antibody (ANA): NEGATIVE

## 2019-03-04 NOTE — Telephone Encounter (Signed)
Primary Pulmonologist: CY Last office visit and with whom: CY televisit 02/11/19 What do we see them for (pulmonary problems): dyspnea Last OV assessment/plan:  Deneise Lever, MD (Physician) at 02/11/2019 4:23 PM - Written    She is concerned breathing might be abnormal. My impression is anxiety as no objective problems have been identified. Possibly she is feeling some effect from her Beta-blocker. Possibly some subtle impact from spring pollens, although she hasn't considered herself an allergic person.  Plan- keep PFT appointment, ABG on room air, retry Spiriva 1 puff, once daily. Consider cardiopulmonary stress test in future.     Was appointment offered to patient (explain)?  Yes, she would like to see what CY suggests first  Reason for call: patient is having difficulties breathing, her SOB has worsen in last week. She states her chest is tight, hears rattling sounds when taking deep breath in, and some wheezing. Pt states this is effecting her daily functions, she cannot not eat well in last three weeks she has lost 10lbs. Pt cannot use spirvia daily has this increases her heart rate. Pt would like to know if she needs more tests, in office visit or something called into the pharmacy.  CY please advise. Thank you.  (examples of things to ask: : When did symptoms start? Fever? Cough? Productive? Color to sputum? More sputum than usual? Wheezing? Have you needed increased oxygen? Are you taking your respiratory medications? What over the counter measures have you tried?)

## 2019-03-04 NOTE — Telephone Encounter (Signed)
Called and spoke with patient regarding CY recommendations below Pt advised she cannot use and will not use albuterol rescue inhaler,  She has used the heating pad, and Tylenol and no relief or help Advised patient to go to [facility] ER today Pt expressed and verbalized understanding, and advised she is on the way to the [facility] ER Nothing further needed

## 2019-03-04 NOTE — Telephone Encounter (Signed)
° ° °  Patient calling for status of CT order

## 2019-03-04 NOTE — Telephone Encounter (Signed)
These discomforts have been evaluated to the best of our ability for now, until the Covid rules are lifted.  Suggest she use her albuterol rescue inhaler, take a xanax if needed, use a heating pad for the chest soreness, and take ibuprofen or something like Tylenol . If it gets worse, go to ER.

## 2019-03-04 NOTE — Telephone Encounter (Signed)
Pt is looking for a referral for GI doctor. Patinet is wanting a referral if possible with out coming in again [phone]

## 2019-03-04 NOTE — Telephone Encounter (Signed)
Pt already made aware of results documented on lab encounter. Will close encounter Notified patient by phone of Dr. Janee Morn review of labwork. Patient acknowledged understanding of information and had no further questions. Relieved that labwork looks good and no concern for blood clot. Will await our call for final result when it is complete and will call if new concerns develop. Nothing further needed at this time

## 2019-03-04 NOTE — Telephone Encounter (Signed)
-----   Message from Vivia Ewing, LPN sent at 9/0/3014  1:12 PM EDT ----- Regarding: FW: ABG Please be sure Dr. Annamaria Boots is aware of this. Thanks.  ----- Message ----- From: Ilona Sorrel Sent: 02/12/2019   8:04 AM EDT To: Deneise Lever, MD, Vivia Ewing, LPN Subject: ABG                                            Order was placed yesterday for pt to have ABG as soon as possible.  I called [facility] Therapy Dept this morning and spoke to the manager Terri.  She said they are not scheduling any outpatient procedures at this time.      Judeen Hammans

## 2019-03-05 NOTE — Telephone Encounter (Signed)
Are you taking care of this

## 2019-03-06 ENCOUNTER — Telehealth: Payer: Self-pay | Admitting: Internal Medicine

## 2019-03-06 NOTE — Telephone Encounter (Signed)
Left message in voice mail of mobile to call back. What is the reason for GI referral?

## 2019-03-06 NOTE — Telephone Encounter (Signed)
PA request received from [place] requested: Spiriva 1.25 mcg CMM Key: [id number] PA request has been approved through 03/05/2020.  Pharmacy aware. Nothing further needed at this time- will close encounter.

## 2019-03-07 ENCOUNTER — Other Ambulatory Visit: Payer: Self-pay

## 2019-03-07 ENCOUNTER — Ambulatory Visit (INDEPENDENT_AMBULATORY_CARE_PROVIDER_SITE_OTHER): Payer: Managed Care, Other (non HMO) | Admitting: Gastroenterology

## 2019-03-07 DIAGNOSIS — R0789 Other chest pain: Secondary | ICD-10-CM

## 2019-03-07 NOTE — Progress Notes (Signed)
Patient cancelled encounter and rescheduled the appointment. An encounter had already been started.

## 2019-03-08 ENCOUNTER — Encounter: Payer: Self-pay | Admitting: Gastroenterology

## 2019-03-08 ENCOUNTER — Ambulatory Visit (INDEPENDENT_AMBULATORY_CARE_PROVIDER_SITE_OTHER): Payer: Managed Care, Other (non HMO) | Admitting: Gastroenterology

## 2019-03-08 ENCOUNTER — Telehealth: Payer: Self-pay | Admitting: Internal Medicine

## 2019-03-08 ENCOUNTER — Other Ambulatory Visit: Payer: Self-pay

## 2019-03-08 VITALS — Ht 64.0 in | Wt 160.0 lb

## 2019-03-08 DIAGNOSIS — R0789 Other chest pain: Secondary | ICD-10-CM

## 2019-03-08 NOTE — Progress Notes (Signed)
Patient had a 16:00 appointment. Did not answer my phone calls or Doximity texts for video conference over a 20 minute time period. She called the front desk and rescheduled her appointment.

## 2019-03-08 NOTE — Telephone Encounter (Signed)
Follow up:    Please call patient concering a CT scan. Please call patient back.

## 2019-03-08 NOTE — Telephone Encounter (Signed)
Called and spoke with Patient.  Patient stated she was seen in the ED last and had a CT.  Patient stated she was told she would need follow chest CT, because her right lung showed something.  Patient stated cardiology was going to schedule a cardiology CT and was going to check to see if both CT's could be done together.  Patient stated she she is walking daily, but is having SHOB the day after. Patient just wants to follow up for abnormal CT.  Message routed to Dr Annamaria Boots  Current Outpatient Medications on File Prior to Visit  Medication Sig Dispense Refill  . acetaminophen (TYLENOL) 500 MG tablet Take 500 mg by mouth as needed.    Marland Kitchen albuterol (PROVENTIL HFA;VENTOLIN HFA) 108 (90 Base) MCG/ACT inhaler Inhale 1-2 puffs into the lungs every 6 (six) hours as needed for wheezing or shortness of breath. (Patient not taking: Reported on 02/11/2019) 1 Inhaler 0  . ALPRAZolam (NIRAVAM) 0.25 MG dissolvable tablet Take 1 tablet (0.25 mg total) by mouth at bedtime as needed for anxiety. (Patient not taking: Reported on 03/08/2019) 20 tablet 1  . atenolol (TENORMIN) 25 MG tablet Take 1 tablet (25 mg total) by mouth daily. (Patient taking differently: Take by mouth daily. ) 180 tablet 3  . blood glucose meter kit and supplies Test blood sugar twice daily. Dx code: R73.09 1 each 0  . famotidine (PEPCID) 20 MG tablet Take 1 tablet (20 mg total) by mouth 2 (two) times daily. 180 tablet 0  . fluticasone (FLONASE) 50 MCG/ACT nasal spray Place 2 sprays into both nostrils daily. (Patient taking differently: Place 2 sprays into both nostrils daily as needed (seasonal allergies). ) 16 g 0  . glucose blood (ONETOUCH VERIO) test strip TEST BLOOD SUGAR DAILY 25 each 10  . LORazepam (ATIVAN) 0.5 MG tablet Take 0.5 tablets (0.25 mg total) by mouth every 6 (six) hours as needed for anxiety. (Patient taking differently: Take 0.5 mg by mouth every 6 (six) hours as needed for anxiety. ) 30 tablet 0  . Multiple Vitamins-Minerals  (MULTIVITAMIN GUMMIES ADULT PO) Take 2 tablets by mouth daily.     . Norethindrone-Ethinyl Estradiol-Fe Biphas (LO LOESTRIN FE) 1 MG-10 MCG / 10 MCG tablet Take 1 tablet by mouth at bedtime.     [name] DELICA LANCETS 40J MISC USE TO TEST BLOOD SUGAR DAILY 100 each 3  . OVER THE COUNTER MEDICATION Take 1 tablet by mouth daily. Vitamin B-12 gummy supplement. 1077mg each gummy.     . Tiotropium Bromide Monohydrate (SPIRIVA RESPIMAT) 1.25 MCG/ACT AERS Inhale 2 puffs into the lungs daily. (Patient not taking: Reported on 03/08/2019) 1 Inhaler 5   Current Facility-Administered Medications on File Prior to Visit  Medication Dose Route Frequency Provider Last Rate Last Dose  . cyanocobalamin ((VITAMIN B-12)) injection 1,000 mcg  1,000 mcg Intramuscular Q30 days SShawnee Knapp MD   1,000 mcg at 10/19/18 18119

## 2019-03-09 ENCOUNTER — Emergency Department (HOSPITAL_COMMUNITY)
Admission: EM | Admit: 2019-03-09 | Discharge: 2019-03-09 | Disposition: A | Discharge status: Home / Self Care | Payer: Managed Care, Other (non HMO) | Attending: Emergency Medicine | Admitting: Emergency Medicine

## 2019-03-09 ENCOUNTER — Emergency Department (HOSPITAL_COMMUNITY): Payer: Managed Care, Other (non HMO)

## 2019-03-09 ENCOUNTER — Other Ambulatory Visit: Payer: Self-pay

## 2019-03-09 ENCOUNTER — Encounter (HOSPITAL_COMMUNITY): Payer: Self-pay | Admitting: Emergency Medicine

## 2019-03-09 DIAGNOSIS — Z79899 Other long term (current) drug therapy: Secondary | ICD-10-CM | POA: Insufficient documentation

## 2019-03-09 DIAGNOSIS — Z87891 Personal history of nicotine dependence: Secondary | ICD-10-CM | POA: Insufficient documentation

## 2019-03-09 DIAGNOSIS — I10 Essential (primary) hypertension: Secondary | ICD-10-CM | POA: Diagnosis not present

## 2019-03-09 DIAGNOSIS — R202 Paresthesia of skin: Secondary | ICD-10-CM | POA: Insufficient documentation

## 2019-03-09 DIAGNOSIS — R0602 Shortness of breath: Secondary | ICD-10-CM | POA: Insufficient documentation

## 2019-03-09 DIAGNOSIS — Z793 Long term (current) use of hormonal contraceptives: Secondary | ICD-10-CM | POA: Insufficient documentation

## 2019-03-09 DIAGNOSIS — R0789 Other chest pain: Secondary | ICD-10-CM | POA: Diagnosis present

## 2019-03-09 LAB — BASIC METABOLIC PANEL
Anion gap: 13 (ref 5–15)
BUN: 9 mg/dL (ref 6–20)
CO2: 21 mmol/L — ABNORMAL LOW (ref 22–32)
Calcium: 9 mg/dL (ref 8.9–10.3)
Chloride: 105 mmol/L (ref 98–111)
Creatinine, Ser: 1.09 mg/dL — ABNORMAL HIGH (ref 0.44–1.00)
GFR calc Af Amer: >60 mL/min (ref >60)
GFR calc non Af Amer: >60 mL/min (ref >60)
Glucose, Bld: 104 mg/dL — ABNORMAL HIGH (ref 70–99)
Potassium: 3.5 mmol/L (ref 3.5–5.1)
Sodium: 139 mmol/L (ref 135–145)

## 2019-03-09 LAB — CBC
HCT: 40.8 % (ref 36.0–46.0)
Hemoglobin: 14 g/dL (ref 12.0–15.0)
MCH: 30.8 pg (ref 26.0–34.0)
MCHC: 34.3 g/dL (ref 30.0–36.0)
MCV: 89.9 fL (ref 80.0–100.0)
Platelets: 260 10*3/uL (ref 150–400)
RBC: 4.54 MIL/uL (ref 3.87–5.11)
RDW: 12.2 % (ref 11.5–15.5)
WBC: 9.6 10*3/uL (ref 4.0–10.5)
nRBC: 0 % (ref 0.0–0.2)

## 2019-03-09 LAB — I-STAT BETA HCG BLOOD, ED (MC, WL, AP ONLY): I-stat hCG, quantitative: <5 m[IU]/mL (ref <5)

## 2019-03-09 LAB — TROPONIN I: Troponin I: <0.03 ng/mL (ref <0.03)

## 2019-03-09 MED ORDER — KETOROLAC TROMETHAMINE 30 MG/ML IJ SOLN
15.0000 mg | Freq: Once | INTRAMUSCULAR | Status: DC
  Filled 2019-03-09: qty 1

## 2019-03-09 MED ORDER — IOHEXOL 350 MG/ML SOLN
75.0000 mL | Freq: Once | INTRAVENOUS | Status: AC | PRN
  Administered 2019-03-09: 06:00:00 75 mL via INTRAVENOUS

## 2019-03-09 MED ORDER — SODIUM CHLORIDE 0.9% FLUSH
3.0000 mL | Freq: Once | INTRAVENOUS | Status: AC
  Administered 2019-03-09: 3 mL via INTRAVENOUS

## 2019-03-09 NOTE — ED Provider Notes (Signed)
[facility] EMERGENCY DEPARTMENT Provider Note   CSN: [id number] Arrival date & time: 03/09/19  0007    History   Chief Complaint Chief Complaint  Patient presents with  . Chest Pain  . Shortness of Breath    HPI Brooke Turner is a 43 y.o. female.      Chest Pain  Pain location:  Substernal area, R chest and R lateral chest Pain quality: radiating and sharp   Pain quality: not aching   Pain radiates to:  R shoulder Pain severity:  Mild Timing:  Constant Progression:  Worsening Context: breathing   Context: not lifting   Relieved by:  None tried Worsened by:  Nothing Ineffective treatments:  None tried Associated symptoms: shortness of breath   Associated symptoms comment:  Hand tingling. Has had multiple episodes in past, this one started in the cold.  Shortness of Breath  Associated symptoms: chest pain     Past Medical History:  Diagnosis Date  . Anxiety   . Back pain   . Chest pain    a. 09/2014 Echo: EF 60-65%, no rwma, nl valves;  b. 12/2014 Neg ETT.  . Diabetes mellitus without complication (HCC)    has never taken medications  . Essential hypertension    a. 01/2016 Renal duplex: no RAS- ? renal cyst (renal u/s 4/17 - no cyst, nl study); b. 02/2016 24 hr BP Monitor: Mean SBP ~ 138 with Max of 198. Highest pressures noted in evening hours following placement of cuff.  . Fibroid uterus   . GERD (gastroesophageal reflux disease)   . Palpitations    a. 09/2014 nl event monitor.    Patient Active Problem List   Diagnosis Date Noted  . History of posttraumatic stress disorder (PTSD) 12/31/2018  . History of panic attacks 12/31/2018  . Palpitations   . Atypical chest pain   . Chronic tension-type headache, not intractable 06/10/2015  . Dyspnea 03/18/2015  . Post-traumatic stress reaction 02/25/2015  . Anxiety disorder due to general medical condition with panic attack 02/25/2015  . Subserous leiomyoma of uterus 02/25/2015  .  Anxiety about health 02/25/2015  . Deviated nasal septum 12/30/2014  . Chronic rhinitis 12/30/2014  . Chronic maxillary sinusitis 12/30/2014  . Snorings 09/30/2014  . Prediabetes 08/26/2014  . Acanthosis nigricans 03/19/2014  . Vitamin D deficiency 07/15/2013  . Large breasts 09/12/2012  . Essential hypertension 02/17/2012  . Obesity (BMI 30-39.9) previous BMI was over 40. Patient has lost 70 pounds in the last year 02/17/2012  . Anxiety disorder 02/17/2012  . Chronic back pain 02/17/2012    Past Surgical History:  Procedure Laterality Date  . WISDOM TOOTH EXTRACTION       OB History    Gravida  2   Para  0   Term  0   Preterm  0   AB  2   Living  0     SAB  1   TAB  1   Ectopic  0   Multiple  0   Live Births               Home Medications    Prior to Admission medications   Medication Sig Start Date End Date Taking? Authorizing Provider  acetaminophen (TYLENOL) 500 MG tablet Take 500 mg by mouth every 6 (six) hours as needed for mild pain.    Yes [provider]  albuterol (PROVENTIL HFA;VENTOLIN HFA) 108 (90 Base) MCG/ACT inhaler Inhale 1-2 puffs into the  lungs every 6 (six) hours as needed for wheezing or shortness of breath. 02/07/19  Yes Madilyn Hook A, PA-C  atenolol (TENORMIN) 25 MG tablet Take 1 tablet (25 mg total) by mouth daily. Patient taking differently: Take 12.5 mg by mouth daily.  02/18/19  Yes Deboraha Sprang, MD  famotidine (PEPCID) 20 MG tablet Take 1 tablet (20 mg total) by mouth 2 (two) times daily. 11/20/18  Yes [name], MD  fluticasone [facility]) 50 MCG/ACT nasal spray Place 2 sprays into both nostrils daily. Patient taking differently: Place 2 sprays into both nostrils daily as needed (seasonal allergies).  10/10/17  Yes Timmothy Euler, [place] D, PA-C  LORazepam (ATIVAN) 0.5 MG tablet Take 0.5 tablets (0.25 mg total) by mouth every 6 (six) hours as needed for anxiety. Patient taking differently: Take 0.5 mg by mouth every 6  (six) hours as needed for anxiety.  01/24/19  Yes Forrest Moron, MD  Multiple Vitamins-Minerals (MULTIVITAMIN GUMMIES ADULT PO) Take 2 tablets by mouth daily.    Yes [provider]  Norethindrone-Ethinyl Estradiol-Fe Biphas (LO LOESTRIN FE) 1 MG-10 MCG / 10 MCG tablet Take 1 tablet by mouth at bedtime.    Yes [provider]  OVER THE COUNTER MEDICATION Take 1 tablet by mouth daily. Vitamin B-12 gummy supplement. 1037mg each gummy.    Yes [provider]  blood glucose meter kit and supplies Test blood sugar twice daily. Dx code: R73.09 07/22/15   SShawnee Knapp MD  glucose blood ([facility]) test strip TEST BLOOD SUGAR DAILY 11/16/18   SShawnee Knapp MD  [facility] Center[facility]ICA LANCETS 316XMISC USE TO TEST BLOOD SUGAR DAILY 11/16/18   SShawnee Knapp MD    Family History Family History  Problem Relation Age of Onset  . Seizures Mother   . Epilepsy Mother   . Cancer Father        Liver  . Diabetes Father 620 . Hypertension Father   . Heart disease Father 658      CEA, LE [place]  . Alzheimer's disease Maternal Grandmother   . Heart disease Maternal Grandfather   . Colon cancer Neg Hx     Social History Social History   Tobacco Use  . Smoking status: Former Smoker    Packs/day: 0.25    Years: 10.00    Pack years: 2.50    Types: Cigarettes    Start date: 06/25/2000    Last attempt to quit: 08/19/2014    Years since quitting: 4.5  . Smokeless tobacco: Never Used  Substance Use Topics  . Alcohol use: No    Alcohol/week: 0.0 standard drinks  . Drug use: No     Allergies   Penicillins; Tamiflu [oseltamivir phosphate]; Tinidazole; and Diltiazem   Review of Systems Review of Systems  Respiratory: Positive for shortness of breath.   Cardiovascular: Positive for chest pain.  All other systems reviewed and are negative.    Physical Exam Updated Vital Signs BP 121/65   Pulse 70   Temp 99 F (37.2 C) (Oral)   Resp 18   SpO2 99%   Physical Exam  Vitals signs and nursing note reviewed.  Constitutional:      Appearance: She is well-developed.  HENT:     Head: Normocephalic and atraumatic.  Neck:     Musculoskeletal: Normal range of motion.  Cardiovascular:     Rate and Rhythm: Regular rhythm. Tachycardia present.     Heart sounds: Normal heart sounds.  Pulmonary:  Effort: No respiratory distress.     Breath sounds: No stridor. No decreased breath sounds.  Abdominal:     General: There is no distension.  Musculoskeletal: Normal range of motion.     Right lower leg: No edema.     Left lower leg: No edema.  Neurological:     Mental Status: She is alert.      ED Treatments / Results  Labs (all labs ordered are listed, but only abnormal results are displayed) Labs Reviewed  BASIC METABOLIC PANEL - Abnormal; Notable for the following components:      Result Value   CO2 21 (*)    Glucose, Bld 104 (*)    Creatinine, Ser 1.09 (*)    All other components within normal limits  CBC  TROPONIN I  I-STAT BETA HCG BLOOD, ED ([facility], [facility], [facility] ONLY)    EKG EKG Interpretation  Date/Time:  Saturday Mar 09 2019 00:13:46 EDT Ventricular Rate:  108 PR Interval:  140 QRS Duration: 82 QT Interval:  316 QTC Calculation: 423 R Axis:   50 Text Interpretation:  Sinus tachycardia Otherwise normal ECG Rate faster Confirmed by Merrily Pew [phone]) on 03/09/2019 12:33:32 AM   Radiology Dg Chest 2 View  Result Date: 03/09/2019 CLINICAL DATA:  Chest pain, shortness of breath EXAM: CHEST - 2 VIEW COMPARISON:  02/07/2019 FINDINGS: Heart and mediastinal contours are within normal limits. No focal opacities or effusions. No acute bony abnormality. IMPRESSION: No active cardiopulmonary disease. Electronically Signed   By: Rolm Baptise M.D.   On: 03/09/2019 01:02   Ct Angio Chest Aorta W And/or Wo Contrast  Result Date: 03/09/2019 CLINICAL DATA:  Chest pain and shortness of breath. EXAM: CT ANGIOGRAPHY CHEST WITH CONTRAST TECHNIQUE: Multidetector  CT imaging of the chest was performed using the standard protocol during bolus administration of intravenous contrast. Multiplanar CT image reconstructions and MIPs were obtained to evaluate the vascular anatomy. CONTRAST:  64m OMNIPAQUE IOHEXOL 350 MG/ML SOLN COMPARISON:  CTA chest dated October 30, 2018. FINDINGS: Cardiovascular: Satisfactory opacification of the pulmonary arteries to the segmental level. No evidence of pulmonary embolism. Normal heart size. No pericardial effusion. No thoracic aortic aneurysm or dissection. Mediastinum/Nodes: No enlarged mediastinal, hilar, or axillary lymph nodes. Thyroid gland, trachea, and esophagus demonstrate no significant findings. Lungs/Pleura: No focal consolidation, pleural effusion, or pneumothorax. Unchanged 3 mm pulmonary nodules in the right upper and middle lobes (series 7, images 51 and 85). Upper Abdomen: No acute abnormality. Musculoskeletal: No chest wall abnormality. No acute or significant osseous findings. Review of the MIP images confirms the above findings. IMPRESSION: 1. No evidence of pulmonary embolism. No acute intrathoracic process. 2. Unchanged 3 mm pulmonary nodules in the right upper and middle lobes. No follow-up needed if patient is low-risk (and has no known or suspected primary neoplasm). Non-contrast chest CT can be considered in 8 months if patient is high-risk. This recommendation follows the consensus statement: Guidelines for Management of Incidental Pulmonary Nodules Detected on CT Images: From the Fleischner Society 2017; Radiology 2017; 284:228-243. Electronically Signed   By: WTitus DubinM.D.   On: 03/09/2019 06:04    Procedures Procedures (including critical care time)  Medications Ordered in ED Medications  ketorolac (TORADOL) 30 MG/ML injection 15 mg (15 mg Intravenous Refused 03/09/19 0636)  sodium chloride flush (NS) 0.9 % injection 3 mL (3 mLs Intravenous Given 03/09/19 0404)  iohexol (OMNIPAQUE) 350 MG/ML injection  75 mL (75 mLs Intravenous Contrast Given 03/09/19 0532)  Initial Impression / Assessment and Plan / ED Course  I have reviewed the triage vital signs and the nursing notes.  Pertinent labs & imaging results that were available during my care of the patient were reviewed by me and considered in my medical decision making (see chart for details).        Ruled out for dissection and PE and primary lung pathology. Continue follow up with cardiology. Consider raynauds or anxiety as well.   Final Clinical Impressions(s) / ED Diagnoses   Final diagnoses:  Atypical chest pain    ED Discharge Orders    None       Demerius Podolak, Corene Cornea, MD 03/09/19 [phone]

## 2019-03-09 NOTE — ED Notes (Signed)
ED Provider at bedside. 

## 2019-03-09 NOTE — ED Notes (Signed)
Patient transported to CT 

## 2019-03-09 NOTE — ED Notes (Signed)
Reviewed d/c instructions with pt, who verbalized understanding and had no outstanding questions. Pt labels disposed of in shred bin. Pt departed in NAD, refused use of wheelchair.

## 2019-03-09 NOTE — ED Triage Notes (Signed)
Pt reports SOB and feeling "winded" for the past month.  Reports negative COVID test 1 month ago.  Also reports intermittent chest pain that started at 8pm tonight.  Pt very anxious.  States she was sitting outside for 4 hours waiting for car to get worked on [employer] and started feeling cold and shaking even though she had a coat, hood, and 2 mask on.  States she kept going asking how much longer for her car and bilateral hands started going numb and felt cold.  Also reports nausea.  Pt wearing halter monitor.

## 2019-03-11 ENCOUNTER — Ambulatory Visit: Payer: Managed Care, Other (non HMO) | Admitting: Gastroenterology

## 2019-03-11 NOTE — Telephone Encounter (Signed)
LVM for patient to return call regarding CY Recommendations below. X1

## 2019-03-11 NOTE — Telephone Encounter (Signed)
CT chest at most recent ER visit showed some small lung nodules. We don't need to do anything about these before her next scheduled ov with me in August. I will discuss with her then.

## 2019-03-11 NOTE — Telephone Encounter (Signed)
Called and spoke with patient regarding CY recommendations below Pt moved her f/u appt up to June 2020 with CY due to increase breathing concerns Advised pt that CY will address all concerns and questions at the time of ov Pt expressed and verbalized understanding Nothing further needed at this time.

## 2019-03-11 NOTE — Telephone Encounter (Signed)
Pt is calling back [phone]

## 2019-03-12 NOTE — Addendum Note (Signed)
Addended by: Dollene Primrose on: 03/12/2019 09:56 AM   Modules accepted: Orders

## 2019-03-12 NOTE — Addendum Note (Signed)
Addended by: Dollene Primrose on: 03/12/2019 08:30 AM   Modules accepted: Orders

## 2019-03-13 ENCOUNTER — Other Ambulatory Visit: Payer: Self-pay

## 2019-03-13 ENCOUNTER — Encounter: Payer: Self-pay | Admitting: Gastroenterology

## 2019-03-13 ENCOUNTER — Telehealth: Payer: Self-pay | Admitting: Internal Medicine

## 2019-03-13 ENCOUNTER — Ambulatory Visit (INDEPENDENT_AMBULATORY_CARE_PROVIDER_SITE_OTHER): Payer: Managed Care, Other (non HMO) | Admitting: Gastroenterology

## 2019-03-13 VITALS — Ht 64.0 in | Wt 160.0 lb

## 2019-03-13 DIAGNOSIS — R0789 Other chest pain: Secondary | ICD-10-CM | POA: Diagnosis not present

## 2019-03-13 DIAGNOSIS — R109 Unspecified abdominal pain: Secondary | ICD-10-CM | POA: Diagnosis not present

## 2019-03-13 MED ORDER — PANTOPRAZOLE SODIUM 40 MG PO TBEC: 40.0000 mg | DELAYED_RELEASE_TABLET | Freq: Every day | ORAL | 0 refills | Status: DC

## 2019-03-13 NOTE — Progress Notes (Signed)
TELEHEALTH VISIT  Referring Provider: Forrest Moron, MD Primary Care Physician:  Forrest Moron, MD   Tele-visit due to COVID-19 pandemic Patient requested visit virtually, consented to the virtual encounter via video enabled telemedicine application (D[employer]im[employer]) Contact made at: 16:00 03/13/19 Patient verified by name and date of birth Location of patient: Stationary car Location provider: [place] [facility] Names of persons participating: Me, patient, Patti PJ Martinique CMA Time spent on telehealth visit: 38 minutes I discussed the limitations of evaluation and management by telemedicine. The patient expressed understanding and agreed to proceed.  Reason for Consultation: Atypical chest pain   IMPRESSION:  Atypical chest pain GERD with breakthrough symptoms on H2blocker Dyspepsia Colitis by CT 2016 with no ongoing symptoms  Esophageal etiologies include reflux esophagitis, non-reflux esophagitis, eosinophilic esophagitis, esophageal motility disorder, and functional chest pain. No alarm features are present. Given this differential, a trial of PPI therapy and an EGD with esophageal biopsies are recommended.   PLAN: Start pantoprazole 40 mg daily Continue famotidine 20 mg daily EGD with esophageal biopsies recommended  I consented the patient discussing the risks, benefits, and alternatives to endoscopic evaluation. In particular, we discussed the risks that include, but are not limited to, reaction to medication, cardiopulmonary compromise, bleeding requiring blood transfusion, aspiration resulting in pneumonia, perforation requiring surgery, lack of diagnosis, severe illness requiring hospitalization, and even death. We reviewed the risk of missed lesion including polyps or even cancer. The patient acknowledges these risks and asks that we proceed.   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  The history is  obtained through the patient and review of her electronic health record. She has anxiety, hypertension, and is under evaluation by cardiology for inappropriate tachycardia and by pulmonary for concerns that breathing is more labored than normal.   Constant, sharp, fleeting, substernal sharp chest pain that radiates to the right shoulder. Occasionally occurs throughout the day.  No dysphagia, odynophagia, sore throat, dysphonia, or neck pain. Requires frequent ED visits for the patient. Under evaluation by cardioogy.   Reports "stomach irritation." Feels the fullness a couple of times a month. Constant nausea. Frequent abdominal bloating just below the xiphoid process. One episode of diarrhea.  No other associated symptoms. No identified exacerbating or relieving features.   Previously on ranitidine for intermittent heartburn. Has been taking famotidine since ranitidine was recalled. Famotidine works but it's not as effective.   She has had normal EKGs.  A CT angio chest 03/09/19 showed no PE and an unchanged 2m pulmonary nodule. Currently wearing a heart monitor.   Mom with GERD and a hiatal hernia. Father had peptic ulcer disease. Father with cancer of the bile duct. No known family history of colon cancer or polyps. No family history of uterine/endometrial cancer, pancreatic cancer or gastric/stomach cancer.  Has a history of "colitis" three years ago diagnosed 3 years ago by CT.  Unable to identify food triggers.   EPIC shows a normal CT of the abdomen/pelvis with contrast 10/15/18 obtained to evaluate abdominal pain.   She had an abnormal CT of the abdomen 05/25/15 that showed bowel wall thickening of the transverse colon and thececum.    Past Medical History:  Diagnosis Date  . Anxiety   . Back pain   . Chest pain    a. 09/2014 Echo: EF 60-65%, no rwma, nl valves;  b. 12/2014 Neg ETT.  . Diabetes mellitus without complication (HCC)    has  never taken medications  . Essential hypertension     a. 01/2016 Renal duplex: no RAS- ? renal cyst (renal u/s 4/17 - no cyst, nl study); b. 02/2016 24 hr BP Monitor: Mean SBP ~ 138 with Max of 198. Highest pressures noted in evening hours following placement of cuff.  . Fibroid uterus   . GERD (gastroesophageal reflux disease)   . Palpitations    a. 09/2014 nl event monitor.    Past Surgical History:  Procedure Laterality Date  . WISDOM TOOTH EXTRACTION      Current Outpatient Medications  Medication Sig Dispense Refill  . acetaminophen (TYLENOL) 500 MG tablet Take 500 mg by mouth every 6 (six) hours as needed for mild pain.     Marland Kitchen albuterol (PROVENTIL HFA;VENTOLIN HFA) 108 (90 Base) MCG/ACT inhaler Inhale 1-2 puffs into the lungs every 6 (six) hours as needed for wheezing or shortness of breath. 1 Inhaler 0  . atenolol (TENORMIN) 25 MG tablet Take 1 tablet (25 mg total) by mouth daily. (Patient taking differently: Take 12.5 mg by mouth daily. ) 180 tablet 3  . blood glucose meter kit and supplies Test blood sugar twice daily. Dx code: R73.09 1 each 0  . famotidine (PEPCID) 20 MG tablet Take 1 tablet (20 mg total) by mouth 2 (two) times daily. 180 tablet 0  . fluticasone (FLONASE) 50 MCG/ACT nasal spray Place 2 sprays into both nostrils daily. (Patient taking differently: Place 2 sprays into both nostrils daily as needed (seasonal allergies). ) 16 g 0  . glucose blood (ONETOUCH VERIO) test strip TEST BLOOD SUGAR DAILY 25 each 10  . LORazepam (ATIVAN) 0.5 MG tablet Take 0.5 tablets (0.25 mg total) by mouth every 6 (six) hours as needed for anxiety. (Patient taking differently: Take 0.5 mg by mouth every 6 (six) hours as needed for anxiety. ) 30 tablet 0  . Multiple Vitamins-Minerals (MULTIVITAMIN GUMMIES ADULT PO) Take 2 tablets by mouth daily.     . Norethindrone-Ethinyl Estradiol-Fe Biphas (LO LOESTRIN FE) 1 MG-10 MCG / 10 MCG tablet Take 1 tablet by mouth at bedtime.     [name] DELICA LANCETS 66Q MISC USE TO TEST BLOOD SUGAR DAILY 100  each 3  . OVER THE COUNTER MEDICATION Take 1 tablet by mouth daily. Vitamin B-12 gummy supplement. 1070mg each gummy.      Current Facility-Administered Medications  Medication Dose Route Frequency Provider Last Rate Last Dose  . cyanocobalamin ((VITAMIN B-12)) injection 1,000 mcg  1,000 mcg Intramuscular Q30 days SShawnee Knapp MD   1,000 mcg at 10/19/18 1837    Allergies as of 03/13/2019 - Review Complete 03/13/2019  Allergen Reaction Noted  . Penicillins Hives 01/04/2012  . Tamiflu [oseltamivir phosphate] Nausea And Vomiting 11/18/2013  . Tinidazole  09/14/2015  . Diltiazem  04/12/2016    Family History  Problem Relation Age of Onset  . Seizures Mother   . Epilepsy Mother   . Cancer Father        Liver  . Diabetes Father 622 . Hypertension Father   . Heart disease Father 638      CEA, LE Stenting  . Alzheimer's disease Maternal Grandmother   . Heart disease Maternal Grandfather   . Colon cancer Neg Hx     Social History   Socioeconomic History  . Marital status: Single    Spouse name: N/A  . Number of children: 0  . Years of education: 117 . Highest education level: Not on file  Occupational History  . Occupation: Research scientist (physical sciences): [employer]  Social Needs  . Financial resource strain: Not on file  . Food insecurity:    Worry: Not on file    Inability: Not on file  . Transportation needs:    Medical: Not on file    Non-medical: Not on file  Tobacco Use  . Smoking status: Former Smoker    Packs/day: 0.25    Years: 10.00    Pack years: 2.50    Types: Cigarettes    Start date: 06/25/2000    Last attempt to quit: 08/19/2014    Years since quitting: 4.5  . Smokeless tobacco: Never Used  Substance and Sexual Activity  . Alcohol use: No    Alcohol/week: 0.0 standard drinks  . Drug use: No  . Sexual activity: Not Currently    Partners: Male    Birth control/protection: Pill  Lifestyle  . Physical activity:    Days per week: Not on  file    Minutes per session: Not on file  . Stress: Not on file  Relationships  . Social connections:    Talks on phone: Not on file    Gets together: Not on file    Attends religious service: Not on file    Active member of club or organization: Not on file    Attends meetings of clubs or organizations: Not on file    Relationship status: Not on file  . Intimate partner violence:    Fear of current or ex partner: Not on file    Emotionally abused: Not on file    Physically abused: Not on file    Forced sexual activity: Not on file  Other Topics Concern  . Not on file  Social History Narrative   Patient lives at home alone .   Patient works full time at [employer]   Education college.   Right handed.   Caffeine none    Review of Systems: ALL ROS discussed and all others negative except listed in HPI.  Physical Exam: General: in no acute distress Neuro: Alert and appropriate GI: Localizes the pain to the epigastrium/umbilicus Psych: Normal affect and normal insight   Ossiel Marchio L. Tarri Glenn, MD, MPH [facility] 03/13/2019, 3:41 PM

## 2019-03-13 NOTE — Patient Instructions (Signed)
Continue to take your famotidine.  Add pantoprazole 40 mg daily - taken 30-60 minutes prior to breakfast.  I have recommended an upper endoscopy for further evaluation of your abdominal pain, bloating, and chest pain. We will contact you to schedule that appointment.  Thank you for your patience with me and our technology today! Please stay home, safe, and healthy. I look forward to meeting you in person in the future.

## 2019-03-13 NOTE — Telephone Encounter (Addendum)
Called & spoke w/ pt in regards to her ABG lab. Pt last seen 02/11/2019 by CY. AVS states "we will order an arterial blood gas test to directly measure O2 & CO2 levels." Labs were originally ordered, however, hospital is currently not performing elective ABGs d/t COVID-19 protocol. I informed pt of this. She verbalized understanding.  Pt stated she had an appt set for June 2020, however, I only observed a PFT appt scheduled for 04/09/2019 at 3:00 PM. I offered to set a f/u appt w/ CY on 04/10/2019 at 10:30 AM, which pt agreed to. I let her know that all of her concerns, including the ABG labs, can and will be addressed at her next visit. Pt expressed understanding.   Routing to CY as an [place]. Nothing further needed at this time.

## 2019-03-18 ENCOUNTER — Ambulatory Visit: Payer: Managed Care, Other (non HMO) | Admitting: Internal Medicine

## 2019-03-19 ENCOUNTER — Other Ambulatory Visit: Payer: Self-pay

## 2019-03-19 ENCOUNTER — Telehealth: Payer: Self-pay

## 2019-03-19 ENCOUNTER — Encounter: Payer: Self-pay | Admitting: Family Medicine

## 2019-03-19 ENCOUNTER — Telehealth: Payer: Self-pay | Admitting: Family Medicine

## 2019-03-19 ENCOUNTER — Ambulatory Visit: Payer: Managed Care, Other (non HMO) | Admitting: Family Medicine

## 2019-03-19 VITALS — BP 140/92 | HR 67 | Temp 98.5°F | Resp 17 | Ht 64.0 in | Wt 158.4 lb

## 2019-03-19 DIAGNOSIS — F418 Other specified anxiety disorders: Secondary | ICD-10-CM

## 2019-03-19 DIAGNOSIS — R0789 Other chest pain: Secondary | ICD-10-CM

## 2019-03-19 DIAGNOSIS — R208 Other disturbances of skin sensation: Secondary | ICD-10-CM

## 2019-03-19 DIAGNOSIS — R0602 Shortness of breath: Secondary | ICD-10-CM | POA: Diagnosis not present

## 2019-03-19 DIAGNOSIS — F411 Generalized anxiety disorder: Secondary | ICD-10-CM

## 2019-03-19 MED ORDER — LORAZEPAM 0.5 MG PO TABS: 0.5000 mg | ORAL_TABLET | Freq: Four times a day (QID) | ORAL | 0 refills | Status: DC | PRN

## 2019-03-19 NOTE — Progress Notes (Signed)
Established Patient Office Visit  Subjective:  Patient ID: Brooke Turner, female    DOB: 10-23-1976  Age: 43 y.o. MRN: [id number]  CC:  Chief Complaint  Patient presents with  . breathing issues x 1  month, pt has seen cardio and changed     Discuss daily med for anxiety,? hernia, googled and said hernia can give breathing issues and pain.  ? iron is low and one night felt like she was having hypothermia it was 40 degrees and was so cold and shaking.  Tingling and numbness in hands especially the left hand. Some anxiety with the coldness and numbness- felt like cold sensation going up arm and stopped at elbow-then heart tachycardia and chest pain- after an hour body started to go back to normal. Panic set in and went to hospital.  . Medication Refill    lorazepam    HPI Brooke Turner presents for   She reports that she was out in the cold and the temperature were very cold so she noted that she had increased effort of breathing and she states  That she was in the cold temperatures of 40 degrees She states that she felt like she had low iron She had numbness and tingling of the left hand  She reports that she felt like her hand was numb and tingling She report sthat her hand felt very cold and went from her pamls and went up to her elbow She states that then her heart started beating fast She reports that that she felt like the coldness of her extremities seemed to happen at the same time of her heart racing Once she calmed down and warmed up she started having chest pains  She went to the ER 03/09/2019 for atypical chest pain with ECG that was reassuring with only a heart rate of 108 She states that she received a CT-A and the results were normal for PE and showed a stable 57m pulm nodule that was being tracked She then saw GI on 03/13/2019 for her atypical chest pain and was found to have gastritis and was started on protonix   Past Medical History:  Diagnosis Date   . Anxiety   . Back pain   . Chest pain    a. 09/2014 Echo: EF 60-65%, no rwma, nl valves;  b. 12/2014 Neg ETT.  . Diabetes mellitus without complication (HCC)    has never taken medications  . Essential hypertension    a. 01/2016 Renal duplex: no RAS- ? renal cyst (renal u/s 4/17 - no cyst, nl study); b. 02/2016 24 hr BP Monitor: Mean SBP ~ 138 with Max of 198. Highest pressures noted in evening hours following placement of cuff.  . Fibroid uterus   . GERD (gastroesophageal reflux disease)   . Palpitations    a. 09/2014 nl event monitor.    Past Surgical History:  Procedure Laterality Date  . WISDOM TOOTH EXTRACTION      Family History  Problem Relation Age of Onset  . Seizures Mother   . Epilepsy Mother   . Cancer Father        Liver  . Diabetes Father 653 . Hypertension Father   . Heart disease Father 655      CEA, LE Stenting  . Alzheimer's disease Maternal Grandmother   . Heart disease Maternal Grandfather   . Colon cancer Neg Hx     Social History   Socioeconomic History  . Marital status: Single  Spouse name: N/A  . Number of children: 0  . Years of education: 23  . Highest education level: Not on file  Occupational History  . Occupation: Research scientist (physical sciences): [employer]  Social Needs  . Financial resource strain: Not on file  . Food insecurity:    Worry: Not on file    Inability: Not on file  . Transportation needs:    Medical: Not on file    Non-medical: Not on file  Tobacco Use  . Smoking status: Former Smoker    Packs/day: 0.25    Years: 10.00    Pack years: 2.50    Types: Cigarettes    Start date: 06/25/2000    Last attempt to quit: 08/19/2014    Years since quitting: 4.5  . Smokeless tobacco: Never Used  Substance and Sexual Activity  . Alcohol use: No    Alcohol/week: 0.0 standard drinks  . Drug use: No  . Sexual activity: Not Currently    Partners: Male    Birth control/protection: Pill  Lifestyle  . Physical  activity:    Days per week: Not on file    Minutes per session: Not on file  . Stress: Not on file  Relationships  . Social connections:    Talks on phone: Not on file    Gets together: Not on file    Attends religious service: Not on file    Active member of club or organization: Not on file    Attends meetings of clubs or organizations: Not on file    Relationship status: Not on file  . Intimate partner violence:    Fear of current or ex partner: Not on file    Emotionally abused: Not on file    Physically abused: Not on file    Forced sexual activity: Not on file  Other Topics Concern  . Not on file  Social History Narrative   Patient lives at home alone .   Patient works full time at [employer]   Education college.   Right handed.   Caffeine none    Outpatient Medications Prior to Visit  Medication Sig Dispense Refill  . acetaminophen (TYLENOL) 500 MG tablet Take 500 mg by mouth every 6 (six) hours as needed for mild pain.     Marland Kitchen albuterol (PROVENTIL HFA;VENTOLIN HFA) 108 (90 Base) MCG/ACT inhaler Inhale 1-2 puffs into the lungs every 6 (six) hours as needed for wheezing or shortness of breath. 1 Inhaler 0  . atenolol (TENORMIN) 25 MG tablet Take 1 tablet (25 mg total) by mouth daily. (Patient taking differently: Take 12.5 mg by mouth daily. ) 180 tablet 3  . blood glucose meter kit and supplies Test blood sugar twice daily. Dx code: R73.09 1 each 0  . famotidine (PEPCID) 20 MG tablet Take 1 tablet (20 mg total) by mouth 2 (two) times daily. 180 tablet 0  . fluticasone (FLONASE) 50 MCG/ACT nasal spray Place 2 sprays into both nostrils daily. (Patient taking differently: Place 2 sprays into both nostrils daily as needed (seasonal allergies). ) 16 g 0  . glucose blood (ONETOUCH VERIO) test strip TEST BLOOD SUGAR DAILY 25 each 10  . Multiple Vitamins-Minerals (MULTIVITAMIN GUMMIES ADULT PO) Take 2 tablets by mouth daily.     . Norethindrone-Ethinyl Estradiol-Fe Biphas (LO LOESTRIN  FE) 1 MG-10 MCG / 10 MCG tablet Take 1 tablet by mouth at bedtime.     [name] DELICA LANCETS 97C MISC USE TO TEST BLOOD SUGAR  DAILY 100 each 3  . OVER THE COUNTER MEDICATION Take 1 tablet by mouth daily. Vitamin B-12 gummy supplement. 1070mg each gummy.     . pantoprazole (PROTONIX) 40 MG tablet Take 1 tablet (40 mg total) by mouth daily before breakfast. 90 tablet 0  . LORazepam (ATIVAN) 0.5 MG tablet Take 0.5 tablets (0.25 mg total) by mouth every 6 (six) hours as needed for anxiety. (Patient taking differently: Take 0.5 mg by mouth every 6 (six) hours as needed for anxiety. ) 30 tablet 0   Facility-Administered Medications Prior to Visit  Medication Dose Route Frequency Provider Last Rate Last Dose  . cyanocobalamin ((VITAMIN B-12)) injection 1,000 mcg  1,000 mcg Intramuscular Q30 days SShawnee Knapp MD   1,000 mcg at 10/19/18 1837    Allergies  Allergen Reactions  . Penicillins Hives    Has patient had a PCN reaction causing immediate rash, facial/tongue/throat swelling, SOB or lightheadedness with hypotension:Yes Has patient had a PCN reaction causing severe rash involving mucus membranes or skin necrosis:unsure Has patient had a PCN reaction that required hospitalization: Yes Has patient had a PCN reaction occurring within the last 10 years: No If all of the above answers are "NO", then may proceed with Cephalosporin use.     . Tamiflu [Oseltamivir Phosphate] Nausea And Vomiting    Excessive vomiting  . Tinidazole     Caused severe abd pain/type of colitis   . Diltiazem     Dizzy, felt like she was going to pass out Blood pressure went up per patient     ROS Review of Systems  Constitutional: Positive for fatigue. Negative for activity change and appetite change.  HENT: Negative for facial swelling, hearing loss and mouth sores.   Cardiovascular: Negative for leg swelling.  Gastrointestinal: Negative for blood in stool, constipation, diarrhea and nausea.   Musculoskeletal: Positive for joint swelling. Negative for myalgias.  Psychiatric/Behavioral: Negative for confusion and hallucinations. The patient is nervous/anxious.    See hpi    Objective:    Physical Exam  Physical Exam  Constitutional: Oriented to person, place, and time. Appears well-developed and well-nourished.  HENT:  Head: Normocephalic and atraumatic.  Eyes: Conjunctivae and EOM are normal.  Cardiovascular: Normal rate, regular rhythm, normal heart sounds and intact distal pulses.  No murmur heard. Pulmonary/Chest: Effort normal and breath sounds normal. No stridor. No respiratory distress. Has no wheezes.  Neurological: Is alert and oriented to person, place, and time.  Skin: Skin is warm. Capillary refill takes less than 2 seconds. No discoloration.  Psychiatric: Has a normal mood and affect. Behavior is normal. Judgment and thought content normal.   BP (!) 140/92 (BP Location: Left Arm, Patient Position: Sitting, Cuff Size: Normal)   Pulse 67   Temp 98.5 F (36.9 C) (Oral)   Resp 17   Ht 5' 4"  (1.626 m)   Wt 158 lb 6.4 oz (71.8 kg)   SpO2 95%   BMI 27.19 kg/m  Wt Readings from Last 3 Encounters:  03/19/19 158 lb 6.4 oz (71.8 kg)  03/13/19 160 lb (72.6 kg)  03/08/19 160 lb (72.6 kg)    Lab Results  Component Value Date   TSH 0.933 10/18/2018   Lab Results  Component Value Date   WBC 9.6 03/09/2019   HGB 14.0 03/09/2019   HCT 40.8 03/09/2019   MCV 89.9 03/09/2019   PLT 260 03/09/2019    There are no preventive care reminders to display for this patient.  There are no  preventive care reminders to display for this patient.  Lab Results  Component Value Date   TSH 0.933 10/18/2018   Lab Results  Component Value Date   WBC 9.6 03/09/2019   HGB 14.0 03/09/2019   HCT 40.8 03/09/2019   MCV 89.9 03/09/2019   PLT 260 03/09/2019   Lab Results  Component Value Date   NA 139 03/09/2019   K 3.5 03/09/2019   CO2 21 (L) 03/09/2019   GLUCOSE 104  (H) 03/09/2019   BUN 9 03/09/2019   CREATININE 1.09 (H) 03/09/2019   BILITOT 1.3 (H) 02/28/2019   ALKPHOS 36 (L) 02/28/2019   AST 16 02/28/2019   ALT 13 02/28/2019   PROT 6.9 02/28/2019   ALBUMIN 4.1 02/28/2019   CALCIUM 9.0 03/09/2019   ANIONGAP 13 03/09/2019   GFR 70.52 02/28/2019   Lab Results  Component Value Date   CHOL 172 10/25/2016   Lab Results  Component Value Date   HDL 49 10/25/2016   Lab Results  Component Value Date   LDLCALC 106 (H) 10/25/2016   Lab Results  Component Value Date   TRIG 87 10/25/2016   Lab Results  Component Value Date   CHOLHDL 3.5 10/25/2016   Lab Results  Component Value Date   HGBA1C 5.7 (A) 10/19/2018      Assessment & Plan:   Problem List Items Addressed This Visit      Other   Anxiety disorder - Primary   Relevant Medications   LORazepam (ATIVAN) 0.5 MG tablet   Anxiety about health - discussed SSRI like zoloft and celexa She should finish her pulm evaluation first then follow up to discuss findings and to discuss a daily anxiety medication such as celexa or zoloft Continue ativan prn for now   Relevant Medications   LORazepam (ATIVAN) 0.5 MG tablet   Dyspnea   Atypical chest pain  - evaluated it the ER and also with GI She has follow up for dyspnea with Pulmonology Dyspnea not like cardiac but could be pulmonary    Other Visit Diagnoses    Complaining of cold hands    - reviewed labs, no evident of anemia, no evidence of thyroid disease, exam shows warm hands with normal cap refill without skin color changes in the cold exam room Not likely Raynaud's      Meds ordered this encounter  Medications  . LORazepam (ATIVAN) 0.5 MG tablet    Sig: Take 1 tablet (0.5 mg total) by mouth every 6 (six) hours as needed for anxiety.    Dispense:  15 tablet    Refill:  0   A total of 55 minutes were spent face-to-face with the patient during this encounter and over half of that time was spent on counseling and coordination  of care.   Follow-up: Return in about 6 weeks (around 04/30/2019) for anxiety.    Forrest Moron, MD

## 2019-03-19 NOTE — Telephone Encounter (Signed)
03/19/2019 - PATIENT SAW DR. Nolon Rod ON TUES. (03/19/2019). DR. Nolon Rod HAS REQUESTED SHE RETURN FOR A FOLLOW-UP VISIT IN 6 WEEKS. I TRIED TO SCHEDULE THIS BUT HAD TO LEAVE HER A VOICE MAIL TO RETURN OUR CALL. [place]

## 2019-03-19 NOTE — Telephone Encounter (Signed)
I have left her a voice mail message to call us back to set up the EGD with Dr Tarri Glenn. Marlon Pel, CMA also left her a voice mail message the day of her televisit to call us to set this up. We will await her call.

## 2019-03-19 NOTE — Patient Instructions (Signed)
° ° ° °  If you have lab work done today you will be contacted with your lab results within the next 2 weeks.  If you have not heard from us then please contact us. The fastest way to get your results is to register for My Chart. ° ° °IF you received an x-ray today, you will receive an invoice from [facility]. Please contact [facility] at [phone] with questions or concerns regarding your invoice.  ° °IF you received labwork today, you will receive an invoice from [facility]. Please contact [facility] at [phone] with questions or concerns regarding your invoice.  ° °Our billing staff will not be able to assist you with questions regarding bills from these companies. ° °You will be contacted with the lab results as soon as they are available. The fastest way to get your results is to activate your My Chart account. Instructions are located on the last page of this paperwork. If you have not heard from us regarding the results in 2 weeks, please contact this office. °  ° ° ° °

## 2019-03-21 NOTE — Telephone Encounter (Signed)
Patient is returning your call... I did give her the option for 04-16-19.Marland Kitchen

## 2019-03-21 NOTE — Telephone Encounter (Signed)
I left her a message to call us back and we will get her EGD set up.

## 2019-03-21 NOTE — Telephone Encounter (Signed)
Left another detailed voice mail to call us back to set up the EGD with Dr Tarri Glenn for her abdominal pain, bloating, and chest pain.

## 2019-03-22 ENCOUNTER — Telehealth: Payer: Self-pay | Admitting: Family Medicine

## 2019-03-22 ENCOUNTER — Telehealth: Payer: Self-pay | Admitting: Internal Medicine

## 2019-03-22 NOTE — Telephone Encounter (Signed)
Please advise as we don't refill this medication.

## 2019-03-22 NOTE — Telephone Encounter (Signed)
Left message for patient to call back  

## 2019-03-22 NOTE — Telephone Encounter (Signed)
New Message             Patient has called several times and no call back. Patient needs a call back as soon as possible, concerning her meds as well as her CT. Pls Pls call to advise.

## 2019-03-22 NOTE — Telephone Encounter (Signed)
Pt wants a calll back regarding quanityy of refills on lorazapam..she was given only 15 and usually get 30 with her  refillls    FR

## 2019-04-01 NOTE — Telephone Encounter (Signed)
Patient got refills.

## 2019-04-02 NOTE — Telephone Encounter (Signed)
I have been unable to reach Brooke Turner by phone so I have mailed her a letter to call us and set up her EGD with Dr Tarri Glenn and we can mail her instructions.

## 2019-04-03 NOTE — Telephone Encounter (Signed)
Order in [place] placed by Dr Lequita Halt

## 2019-04-04 ENCOUNTER — Other Ambulatory Visit: Payer: Self-pay | Admitting: Internal Medicine

## 2019-04-09 ENCOUNTER — Telehealth (HOSPITAL_COMMUNITY): Payer: Self-pay | Admitting: Cardiology

## 2019-04-09 NOTE — Telephone Encounter (Signed)
Left message saying only "Can you please call [facility]" DPR confusing (discussed with supervisor).

## 2019-04-10 ENCOUNTER — Other Ambulatory Visit: Payer: Self-pay

## 2019-04-10 ENCOUNTER — Ambulatory Visit (HOSPITAL_COMMUNITY): Payer: Managed Care, Other (non HMO) | Attending: Adult Health

## 2019-04-10 ENCOUNTER — Ambulatory Visit: Payer: Managed Care, Other (non HMO) | Admitting: Internal Medicine

## 2019-04-10 DIAGNOSIS — R002 Palpitations: Secondary | ICD-10-CM | POA: Diagnosis not present

## 2019-04-11 ENCOUNTER — Telehealth: Payer: Self-pay | Admitting: Cardiology

## 2019-04-11 ENCOUNTER — Telehealth: Payer: Self-pay | Admitting: Family Medicine

## 2019-04-11 NOTE — Telephone Encounter (Signed)
New Message   Patient called in wanting to make an appointment which I was going to handle for her.  Then she stated never mind she would like a nurse to call her to talk about a procedure she just had.  Patient would go into detail just wants a nurse to call her about the procedure then she also stated she has questions about a few medications she take and wouldn't go into detail about the medication either.

## 2019-04-11 NOTE — Telephone Encounter (Signed)
Notes recorded by Lendon Colonel, NP on 04/10/2019 at 5:38 PM EDT Echo has normal EF of 60-65%. Mild stiffening on relaxation. No changes in her medical regimen. Valves are normal.  Pt is asking about medication-she is asking what is the difference between metoprolol and atenolol. She states that she has been on atenolol 12.5mg  and she states that whole pill make HR too low into the 50's she states that she thinks the heat is increasing her HR to 80's. Ankles are a little swollen with the heat. She is still hesitant to increase the atenolol. Informed pt of ECHO results, she is very confused by "stiffneing" tried to tell her this is common/normal but she is further confused she wants an appt to discuss.  video visit scheduled 04-15-2019 with KL

## 2019-04-11 NOTE — Telephone Encounter (Signed)
Copied from [place] [id number]. Topic: General - Other >> Apr 11, 2019  1:59 PM Celene Kras A wrote: Reason for CRM: Pt called regarding having questions for her most recent labs. Pt also had questions regarding medications.

## 2019-04-12 ENCOUNTER — Telehealth: Payer: Self-pay | Admitting: Internal Medicine

## 2019-04-12 NOTE — Telephone Encounter (Signed)
Pt called back again to check the status of a return phone call   Best number -[phone]

## 2019-04-12 NOTE — Telephone Encounter (Signed)
Thank you. She is very anxious and has many questions about her health. I will try to address them on her appointment. She was to see Dr Caryl Comes also.  Curt Bears

## 2019-04-15 ENCOUNTER — Encounter: Payer: Self-pay | Admitting: Medical

## 2019-04-15 ENCOUNTER — Telehealth (INDEPENDENT_AMBULATORY_CARE_PROVIDER_SITE_OTHER): Payer: Managed Care, Other (non HMO) | Admitting: Medical

## 2019-04-15 ENCOUNTER — Telehealth: Payer: Self-pay

## 2019-04-15 ENCOUNTER — Telehealth: Payer: Self-pay | Admitting: Medical

## 2019-04-15 ENCOUNTER — Other Ambulatory Visit (HOSPITAL_COMMUNITY)
Admission: RE | Admit: 2019-04-15 | Discharge: 2019-04-15 | Disposition: A | Discharge status: Home / Self Care | Payer: Managed Care, Other (non HMO) | Source: Ambulatory Visit | Attending: Internal Medicine | Admitting: Internal Medicine

## 2019-04-15 VITALS — BP 127/77 | HR 124 | Ht 64.0 in | Wt 158.0 lb

## 2019-04-15 DIAGNOSIS — R4589 Other symptoms and signs involving emotional state: Secondary | ICD-10-CM

## 2019-04-15 DIAGNOSIS — R079 Chest pain, unspecified: Secondary | ICD-10-CM

## 2019-04-15 DIAGNOSIS — I4711 Inappropriate sinus tachycardia, so stated: Secondary | ICD-10-CM

## 2019-04-15 DIAGNOSIS — Z1159 Encounter for screening for other viral diseases: Secondary | ICD-10-CM | POA: Insufficient documentation

## 2019-04-15 DIAGNOSIS — I10 Essential (primary) hypertension: Secondary | ICD-10-CM

## 2019-04-15 DIAGNOSIS — R000 Tachycardia, unspecified: Secondary | ICD-10-CM

## 2019-04-15 DIAGNOSIS — R002 Palpitations: Secondary | ICD-10-CM | POA: Diagnosis not present

## 2019-04-15 DIAGNOSIS — F418 Other specified anxiety disorders: Secondary | ICD-10-CM | POA: Diagnosis not present

## 2019-04-15 MED ORDER — METOPROLOL TARTRATE 25 MG PO TABS: ORAL_TABLET | ORAL | 6 refills | Status: DC

## 2019-04-15 NOTE — Telephone Encounter (Signed)
Please advise, Thank you.

## 2019-04-15 NOTE — Telephone Encounter (Signed)
Metoprolol will be a better choice for her cardiac indication. Propranolol is used for anxiety but to manage your heart needs, you will need 3 to 4 times daily dosing and may cause more side effects.

## 2019-04-15 NOTE — Progress Notes (Signed)
Virtual Visit via Video Note   This visit type was conducted due to national recommendations for restrictions regarding the COVID-19 Pandemic (e.g. social distancing) in an effort to limit this patient's exposure and mitigate transmission in our community.  Due to her co-morbid illnesses, this patient is at least at moderate risk for complications without adequate follow up.  This format is felt to be most appropriate for this patient at this time.  All issues noted in this document were discussed and addressed.  A limited physical exam was performed with this format.  Please refer to the patient's chart for her consent to telehealth for [facility].   Date:  04/15/2019   ID:  Brooke Turner, DOB [birth year], MRN [id number]  Patient Location: Other:  in her car Provider Location: Office  PCP:  Forrest Moron, MD  Cardiologist:  Minus Breeding, MD  Electrophysiologist:  Virl Axe, MD   Evaluation Performed:  Follow-Up Visit  Chief Complaint:  palpitations  History of Present Illness:    Brooke Turner is a 43 y.o. female with PMH of palpitations, chronic atypical chest pain, anxiety, pre-DM type 2, HTN, GERD, uterine fibroids, who presents for follow-up of her palpitations.   She was last evaluated by cardiology during a telemedicine visit with Dr. Caryl Comes 02/18/2019 for follow-up of her tachy-palpitations. This has been an ongoing issue for several years, for which she has had an extensive work-up to date, including a 30-day event monitor which showed intermittent sinus tachycardia but no other arrhythmias, an echocardiogram which revealed EF 60-65%, G1DD, no RWMA, and no significant valvular abnormalities, and a sleep study which was negative for OSA. She is awaiting a coronary CTA and PFTs at this time.   She presents today for follow-up of her tachy-palpitations. She has noticed an increasing frequency of symptoms over the past several weeks. She reports frequently  waking in the middle of the night with her heart racing. She then wakes in the morning feeling groggy and notices nausea, epigastric discomfort, and a racing heart beat. She reports her symptoms tend to improve throughout the day after she takes her atenolol. She is hesitant to increase atenolol dosing given intermittent bradycardia with HR in the 50s. She reports prior intolerance to carvedilol with associated SOB. She has tried propranolol in the past, however was subsequently on atenolol at the same time, therefore has never tried as monotherapy. We discussed her echocardiogram results and she expressed concerns regarding the impaired relaxation. She reports occasional SOB and states it feels like there is fluid in her lungs from time to time. She also endorses some LE edema. She states she works in [employer] and is very cognizant of her sodium intake, which she limits to less than 2146m daily. Her HR improved to the 90s during our conversation.   The patient does not have symptoms concerning for COVID-19 infection (fever, chills, cough, or new shortness of breath).    Past Medical History:  Diagnosis Date  . Anxiety   . Back pain   . Chest pain    a. 09/2014 Echo: EF 60-65%, no rwma, nl valves;  b. 12/2014 Neg ETT.  . Diabetes mellitus without complication (HCC)    has never taken medications  . Essential hypertension    a. 01/2016 Renal duplex: no RAS- ? renal cyst (renal u/s 4/17 - no cyst, nl study); b. 02/2016 24 hr BP Monitor: Mean SBP ~ 138 with Max of 198. Highest pressures noted in evening hours following  placement of cuff.  . Fibroid uterus   . GERD (gastroesophageal reflux disease)   . Palpitations    a. 09/2014 nl event monitor.   Past Surgical History:  Procedure Laterality Date  . WISDOM TOOTH EXTRACTION       Current Meds  Medication Sig  . acetaminophen (TYLENOL) 500 MG tablet Take 500 mg by mouth every 6 (six) hours as needed for mild pain.   Marland Kitchen albuterol (PROVENTIL  HFA;VENTOLIN HFA) 108 (90 Base) MCG/ACT inhaler Inhale 1-2 puffs into the lungs every 6 (six) hours as needed for wheezing or shortness of breath.  . Biotin w/ Vitamins C & E 1250-7.5-7.5 MCG-MG-UNT CHEW Chew by mouth.  . blood glucose meter kit and supplies Test blood sugar twice daily. Dx code: R73.09  . famotidine (PEPCID) 20 MG tablet Take 1 tablet (20 mg total) by mouth 2 (two) times daily.  . fluticasone (FLONASE) 50 MCG/ACT nasal spray Place 2 sprays into both nostrils daily. (Patient taking differently: Place 2 sprays into both nostrils daily as needed (seasonal allergies). )  . glucose blood (ONETOUCH VERIO) test strip TEST BLOOD SUGAR DAILY  . LORazepam (ATIVAN) 0.5 MG tablet Take 1 tablet (0.5 mg total) by mouth every 6 (six) hours as needed for anxiety. (Patient taking differently: Take 0.25 mg by mouth every 6 (six) hours as needed for anxiety. )  . Multiple Vitamins-Minerals (MULTIVITAMIN GUMMIES ADULT PO) Take 2 tablets by mouth daily.   . Norethindrone-Ethinyl Estradiol-Fe Biphas (LO LOESTRIN FE) 1 MG-10 MCG / 10 MCG tablet Take 1 tablet by mouth at bedtime.   [name] DELICA LANCETS 00L MISC USE TO TEST BLOOD SUGAR DAILY  . OVER THE COUNTER MEDICATION Take 1 tablet by mouth daily. Vitamin B-12 gummy supplement. 1048mg each gummy.   . [DISCONTINUED] atenolol (TENORMIN) 25 MG tablet Take 1 tablet (25 mg total) by mouth daily. (Patient taking differently: Take 12.5 mg by mouth daily. )   Current Facility-Administered Medications for the 04/15/19 encounter (Telemedicine) with KAbigail Butts, PA-C  Medication  . cyanocobalamin ((VITAMIN B-12)) injection 1,000 mcg     Allergies:   Penicillins; Tamiflu [oseltamivir phosphate]; Tinidazole; and Diltiazem   Social History   Tobacco Use  . Smoking status: Former Smoker    Packs/day: 0.25    Years: 10.00    Pack years: 2.50    Types: Cigarettes    Start date: 06/25/2000    Last attempt to quit: 08/19/2014    Years since  quitting: 4.6  . Smokeless tobacco: Never Used  Substance Use Topics  . Alcohol use: No    Alcohol/week: 0.0 standard drinks  . Drug use: No     Family Hx: The patient's family history includes Alzheimer's disease in her maternal grandmother; Cancer in her father; Diabetes (age of onset: 635 in her father; Epilepsy in her mother; Heart disease in her maternal grandfather; Heart disease (age of onset: 623 in her father; Hypertension in her father; Seizures in her mother. There is no history of Colon cancer.  ROS:   Please see the history of present illness.     All other systems reviewed and are negative.   Prior CV studies:   The following studies were reviewed today:  Echocardiogram 04/10/2019: IMPRESSIONS    1. The left ventricle has normal systolic function with an ejection fraction of 60-65%. The cavity size was normal. Left ventricular diastolic Doppler parameters are consistent with impaired relaxation. No evidence of left ventricular regional wall  motion abnormalities.  2. The right ventricle has normal systolic function. The cavity was normal. There is no increase in right ventricular wall thickness.  3. No evidence of mitral valve stenosis. No significant regurgitation.  4. The aortic valve is tricuspid. No stenosis of the aortic valve.  5. The aortic root is normal in size and structure.  6. Normal IVC size. No complete TR doppler jet so unable to estimate PA systolic pressure.  Cardiac event monitor 03/26/2019: Normal sinus rhythm Rare ectopy No significant arrhythmias Complaints including chest pain and fluttering did not correlate with arrhythmia Symptoms occurred with NSR  Labs/Other Tests and Data Reviewed:    EKG:  An ECG dated 03/09/2019 was personally reviewed today and demonstrated:  sinus tachycardia, rate 108, no STE/D, no TWI  Recent Labs: 10/08/2018: B Natriuretic Peptide 36.0 10/18/2018: Magnesium 2.1; TSH 0.933 02/28/2019: ALT 13 03/09/2019: BUN 9;  Creatinine, Ser 1.09; Hemoglobin 14.0; Platelets 260; Potassium 3.5; Sodium 139   Recent Lipid Panel Lab Results  Component Value Date/Time   CHOL 172 10/25/2016 06:50 PM   TRIG 87 10/25/2016 06:50 PM   HDL 49 10/25/2016 06:50 PM   CHOLHDL 3.5 10/25/2016 06:50 PM   LDLCALC 106 (H) 10/25/2016 06:50 PM   LDLDIRECT 101 (H) 10/25/2016 06:50 PM    Wt Readings from Last 3 Encounters:  04/15/19 158 lb (71.7 kg)  03/19/19 158 lb 6.4 oz (71.8 kg)  03/13/19 160 lb (72.6 kg)     Objective:    Vital Signs:  BP 127/77   Pulse (!) 124   Ht 5' 4" (1.626 m)   Wt 158 lb (71.7 kg)   BMI 27.12 kg/m    VITAL SIGNS:  reviewed  ASSESSMENT & PLAN:    1. Tachy-palpitations: experiencing primarily overnight and morning symptoms. She currently takes atenolol in the mornings. She reported SOB with higher doses of carvedilol but felt like palpitations were well controlled on twice a day dosing. She is hesitant to increase atenolol as her HR is occasionally in the 50s.  - Will stop atenolol and trial metoprolol 6.35m BID - uptitrate as tolerated  2. Atypical chest pain: she reports occasional chest pressure which does not correlate with activity. She reports cardiac history in grandparents but not 1st degree relatives and no premature cardiac death. Risk factors for CAD include HTN and borderline DM type 2. Previously recommended for Coronary CTA which has not been completed yet. She expresses concern that she's been having "mini heart attacks" every time she has chest pressure - Ordered for Coronary CTA - will send a message to scheduler to set a date. Hopeful this will given her some peace of mind.   3. Diastolic dysfunction on echo: patient reports some LE edema and occasional SOB. We discussed the importance of maintaining a low sodium diet which she is already compliant with.  - Favor conservative management with compression stockings at this time while we titrate her BBlocker given sensitivities to  medications - Could consider low dose HCTZ in the future if metoprolol tolerated and LE edema persists  4. HTN: BP stable.  - Will transition from atenolol to metoprolol for management of palpitations - uptitrate as tolerated.   5. Pre-DM type 2: last Hgb A1C 5.7 10/2018. Currently diet controlled - Continue close monitoring with PCP  6. Anxiety: she reports that her anxiety has been relatively well controlled with prn ativan. Suspect this has a roll in #1.  - Continue management per PCP.     COVID-19 Education: The  signs and symptoms of COVID-19 were discussed with the patient and how to seek care for testing (follow up with PCP or arrange E-visit).  The importance of social distancing was discussed today.  Time:   Today, I have spent 47 minutes with the patient with telehealth technology discussing the above problems.     Medication Adjustments/Labs and Tests Ordered: Current medicines are reviewed at length with the patient today.  Concerns regarding medicines are outlined above.   Tests Ordered: No orders of the defined types were placed in this encounter.   Medication Changes: Meds ordered this encounter  Medications  . metoprolol tartrate (LOPRESSOR) 25 MG tablet    Sig: 6.6m (1/4 tablet) twice daily    Dispense:  15 tablet    Refill:  6    Disposition:  Follow up in 2 week(s) for close monitoring   Signed, KAbigail Butts PA-C  04/15/2019 10:38 AM    [place]

## 2019-04-15 NOTE — Telephone Encounter (Signed)
Called patient- she was advised of message from PharmD. She was advised to try to metoprolol if it does not work to call us back. Patient verbalized understanding.

## 2019-04-15 NOTE — Patient Instructions (Addendum)
Medication Instructions:  STOP ATENOLOL START METOPROLOL 6.25MG  TWICE DAILY If you need a refill on your cardiac medications before your next appointment, please call your pharmacy.  Testing/Procedures: SCHEDULING WILL BE CALLING YOU TO SCHEDULE CT  Special Instructions: PLEASE PURCHASE AND WEAR COMPRESSION STOCKINGS DAILY AND OFF AT BEDTIME. Compression stockings are elastic socks that squeeze the legs. They help to increase blood flow to the legs and to decrease swelling in the legs from fluid retention, and reduce the chance of developing blood clots in the lower legs.   PLEASE FOLLOW LOW SODIUM DIET-ATTACHED  Follow-Up: You will need a follow up appointment in 2 weeks [place] . You may see Minus Breeding, MD or one of the following Advanced Practice Providers on your designated Care Team:  Rosaria Ferries, PA-C  Jory Sims, DNP, ANP     At [facility], you and your health needs are our priority.  As part of our continuing mission to provide you with exceptional heart care, we have created designated Provider Care Teams.  These Care Teams include your primary Cardiologist (physician) and Advanced Practice Providers (APPs -  Physician Assistants and Nurse Practitioners) who all work together to provide you with the care you need, when you need it.  Thank you for choosing [facility] at [facility]!!     Low-Sodium Eating Plan Sodium, which is an element that makes up salt, helps you maintain a healthy balance of fluids in your body. Too much sodium can increase your blood pressure and cause fluid and waste to be held in your body. Your health care provider or dietitian may recommend following this plan if you have high blood pressure (hypertension), kidney disease, liver disease, or heart failure. Eating less sodium can help lower your blood pressure, reduce swelling, and protect your heart, liver, and kidneys. What are tips for following this plan? General guidelines  Most  people on this plan should limit their sodium intake to 1,500-2,000 mg (milligrams) of sodium each day. Reading food labels   The Nutrition Facts label lists the amount of sodium in one serving of the food. If you eat more than one serving, you must multiply the listed amount of sodium by the number of servings.  Choose foods with less than 140 mg of sodium per serving.  Avoid foods with 300 mg of sodium or more per serving. Shopping  Look for lower-sodium products, often labeled as "low-sodium" or "no salt added."  Always check the sodium content even if foods are labeled as "unsalted" or "no salt added".  Buy fresh foods. ? Avoid canned foods and premade or frozen meals. ? Avoid canned, cured, or processed meats  Buy breads that have less than 80 mg of sodium per slice. Cooking  Eat more home-cooked food and less restaurant, buffet, and fast food.  Avoid adding salt when cooking. Use salt-free seasonings or herbs instead of table salt or sea salt. Check with your health care provider or pharmacist before using salt substitutes.  Cook with plant-based oils, such as canola, sunflower, or olive oil. Meal planning  When eating at a restaurant, ask that your food be prepared with less salt or no salt, if possible.  Avoid foods that contain MSG (monosodium glutamate). MSG is sometimes added to [place] food, bouillon, and some canned foods. What foods are recommended? The items listed may not be a complete list. Talk with your dietitian about what dietary choices are best for you. Grains Low-sodium cereals, including oats, puffed wheat and rice,  and shredded wheat. Low-sodium crackers. Unsalted rice. Unsalted pasta. Low-sodium bread. Whole-grain breads and whole-grain pasta. Vegetables Fresh or frozen vegetables. "No salt added" canned vegetables. "No salt added" tomato sauce and paste. Low-sodium or reduced-sodium tomato and vegetable juice. Fruits Fresh, frozen, or canned fruit.  Fruit juice. Meats and other protein foods Fresh or frozen (no salt added) meat, poultry, seafood, and fish. Low-sodium canned tuna and salmon. Unsalted nuts. Dried peas, beans, and lentils without added salt. Unsalted canned beans. Eggs. Unsalted nut butters. Dairy Milk. Soy milk. Cheese that is naturally low in sodium, such as ricotta cheese, fresh mozzarella, or Swiss cheese Low-sodium or reduced-sodium cheese. Cream cheese. Yogurt. Fats and oils Unsalted butter. Unsalted margarine with no trans fat. Vegetable oils such as canola or olive oils. Seasonings and other foods Fresh and dried herbs and spices. Salt-free seasonings. Low-sodium mustard and ketchup. Sodium-free salad dressing. Sodium-free light mayonnaise. Fresh or refrigerated horseradish. Lemon juice. Vinegar. Homemade, reduced-sodium, or low-sodium soups. Unsalted popcorn and pretzels. Low-salt or salt-free chips. What foods are not recommended? The items listed may not be a complete list. Talk with your dietitian about what dietary choices are best for you. Grains Instant hot cereals. Bread stuffing, pancake, and biscuit mixes. Croutons. Seasoned rice or pasta mixes. Noodle soup cups. Boxed or frozen macaroni and cheese. Regular salted crackers. Self-rising flour. Vegetables Sauerkraut, pickled vegetables, and relishes. Olives. [place] fries. Onion rings. Regular canned vegetables (not low-sodium or reduced-sodium). Regular canned tomato sauce and paste (not low-sodium or reduced-sodium). Regular tomato and vegetable juice (not low-sodium or reduced-sodium). Frozen vegetables in sauces. Meats and other protein foods Meat or fish that is salted, canned, smoked, spiced, or pickled. Bacon, ham, sausage, hotdogs, corned beef, chipped beef, packaged lunch meats, salt pork, jerky, pickled herring, anchovies, regular canned tuna, sardines, salted nuts. Dairy Processed cheese and cheese spreads. Cheese curds. Blue cheese. Feta cheese. String  cheese. Regular cottage cheese. Buttermilk. Canned milk. Fats and oils Salted butter. Regular margarine. Ghee. Bacon fat. Seasonings and other foods Onion salt, garlic salt, seasoned salt, table salt, and sea salt. Canned and packaged gravies. Worcestershire sauce. Tartar sauce. Barbecue sauce. Teriyaki sauce. Soy sauce, including reduced-sodium. Steak sauce. Fish sauce. Oyster sauce. Cocktail sauce. Horseradish that you find on the shelf. Regular ketchup and mustard. Meat flavorings and tenderizers. Bouillon cubes. Hot sauce and Tabasco sauce. Premade or packaged marinades. Premade or packaged taco seasonings. Relishes. Regular salad dressings. Salsa. Potato and tortilla chips. Corn chips and puffs. Salted popcorn and pretzels. Canned or dried soups. Pizza. Frozen entrees and pot pies. Summary  Eating less sodium can help lower your blood pressure, reduce swelling, and protect your heart, liver, and kidneys.  Most people on this plan should limit their sodium intake to 1,500-2,000 mg (milligrams) of sodium each day.  Canned, boxed, and frozen foods are high in sodium. Restaurant foods, fast foods, and pizza are also very high in sodium. You also get sodium by adding salt to food.  Try to cook at home, eat more fresh fruits and vegetables, and eat less fast food, canned, processed, or prepared foods. This information is not intended to replace advice given to you by your health care provider. Make sure you discuss any questions you have with your health care provider. Document Released: 04/15/2002 Document Revised: 10/17/2016 Document Reviewed: 10/17/2016 Elsevier Interactive Patient Education  2019 [employer].

## 2019-04-15 NOTE — Telephone Encounter (Signed)
Left voicemail for patient to call office back to prechart before 9:15 virtual visit with Jory Sims.

## 2019-04-15 NOTE — Telephone Encounter (Signed)
New Message     Pt c/o medication issue:  1. Name of Medication: metoprolol and propanolol   2. How are you currently taking this medication (dosage and times per day)? Not taking either   3. Are you having a reaction (difficulty breathing--STAT)? No   4. What is your medication issue? Pt says she is suppose to start the metoprolol tomorrow and she is wondering if it would be better to take that then the Propanolol. PCP told her the Propanolol has been known to help with anxiety. She is wondering which one would be better    Please call

## 2019-04-16 ENCOUNTER — Telehealth: Payer: Self-pay | Admitting: Medical

## 2019-04-16 LAB — NOVEL CORONAVIRUS, NAA (HOSP ORDER, SEND-OUT TO REF LAB; TAT 18-24 HRS): SARS-CoV-2, NAA: NOT DETECTED

## 2019-04-16 NOTE — Telephone Encounter (Signed)
Returned call to patient who had virtual visit 6/8.   She started metoprolol yesterday and reports slight sharp chest pains that can go to her shoulder. They come and go. She is concerned they are related to metoprolol since this is the only recent change  She has questions about her diastolic dysfunction noted on echo. She wants to know what causes this, what can be done about it, at what point do you do anything about it. Does this cause her to have chest pain or wake up with her heart pounding  Will route to PA

## 2019-04-16 NOTE — Telephone Encounter (Signed)
New Message:    Pt says she had a Virtual Visit with [place] yesterday.  She have some questions she needs to ask her. She also wants to discuss the new medication(Metoprolol). She said she had a little chest pain today,not at this time. She wonder if  it might be coming from the Metoprolol.

## 2019-04-17 ENCOUNTER — Telehealth: Payer: Self-pay | Admitting: Internal Medicine

## 2019-04-17 NOTE — Telephone Encounter (Signed)
ATC pt, went to voicemail. LMTCB X1.

## 2019-04-18 ENCOUNTER — Ambulatory Visit: Payer: Managed Care, Other (non HMO) | Admitting: Internal Medicine

## 2019-04-18 ENCOUNTER — Encounter (HOSPITAL_COMMUNITY): Payer: Self-pay | Admitting: Emergency Medicine

## 2019-04-18 ENCOUNTER — Encounter: Payer: Self-pay | Admitting: *Deleted

## 2019-04-18 ENCOUNTER — Emergency Department (HOSPITAL_COMMUNITY): Payer: Managed Care, Other (non HMO)

## 2019-04-18 ENCOUNTER — Telehealth: Payer: Self-pay | Admitting: Cardiology

## 2019-04-18 ENCOUNTER — Other Ambulatory Visit: Payer: Self-pay

## 2019-04-18 ENCOUNTER — Emergency Department (HOSPITAL_COMMUNITY)
Admission: EM | Admit: 2019-04-18 | Discharge: 2019-04-18 | Disposition: A | Discharge status: Home / Self Care | Payer: Managed Care, Other (non HMO) | Attending: Emergency Medicine | Admitting: Emergency Medicine

## 2019-04-18 DIAGNOSIS — Z793 Long term (current) use of hormonal contraceptives: Secondary | ICD-10-CM | POA: Diagnosis not present

## 2019-04-18 DIAGNOSIS — Z87891 Personal history of nicotine dependence: Secondary | ICD-10-CM | POA: Insufficient documentation

## 2019-04-18 DIAGNOSIS — R072 Precordial pain: Secondary | ICD-10-CM | POA: Diagnosis not present

## 2019-04-18 DIAGNOSIS — E876 Hypokalemia: Secondary | ICD-10-CM | POA: Diagnosis not present

## 2019-04-18 DIAGNOSIS — F4310 Post-traumatic stress disorder, unspecified: Secondary | ICD-10-CM | POA: Insufficient documentation

## 2019-04-18 DIAGNOSIS — I10 Essential (primary) hypertension: Secondary | ICD-10-CM | POA: Diagnosis not present

## 2019-04-18 DIAGNOSIS — R002 Palpitations: Secondary | ICD-10-CM | POA: Diagnosis not present

## 2019-04-18 LAB — BASIC METABOLIC PANEL
Anion gap: 13 (ref 5–15)
BUN: 11 mg/dL (ref 6–20)
CO2: 19 mmol/L — ABNORMAL LOW (ref 22–32)
Calcium: 8.8 mg/dL — ABNORMAL LOW (ref 8.9–10.3)
Chloride: 107 mmol/L (ref 98–111)
Creatinine, Ser: 1.16 mg/dL — ABNORMAL HIGH (ref 0.44–1.00)
GFR calc Af Amer: >60 mL/min (ref >60)
GFR calc non Af Amer: 58 mL/min — ABNORMAL LOW (ref >60)
Glucose, Bld: 129 mg/dL — ABNORMAL HIGH (ref 70–99)
Potassium: 3.2 mmol/L — ABNORMAL LOW (ref 3.5–5.1)
Sodium: 139 mmol/L (ref 135–145)

## 2019-04-18 LAB — CBC
HCT: 40.6 % (ref 36.0–46.0)
Hemoglobin: 13.9 g/dL (ref 12.0–15.0)
MCH: 30.6 pg (ref 26.0–34.0)
MCHC: 34.2 g/dL (ref 30.0–36.0)
MCV: 89.4 fL (ref 80.0–100.0)
Platelets: 286 10*3/uL (ref 150–400)
RBC: 4.54 MIL/uL (ref 3.87–5.11)
RDW: 12.1 % (ref 11.5–15.5)
WBC: 7.8 10*3/uL (ref 4.0–10.5)
nRBC: 0 % (ref 0.0–0.2)

## 2019-04-18 LAB — I-STAT BETA HCG BLOOD, ED (MC, WL, AP ONLY): I-stat hCG, quantitative: <5 m[IU]/mL (ref <5)

## 2019-04-18 LAB — I-STAT TROPONIN, ED: Troponin i, poc: 0 ng/mL (ref 0.00–0.08)

## 2019-04-18 LAB — TROPONIN I: Troponin I: <0.03 ng/mL (ref <0.03)

## 2019-04-18 MED ORDER — POTASSIUM CHLORIDE CRYS ER 20 MEQ PO TBCR: 20.0000 meq | EXTENDED_RELEASE_TABLET | Freq: Every day | ORAL | 0 refills | Status: DC

## 2019-04-18 MED ORDER — POTASSIUM CHLORIDE CRYS ER 20 MEQ PO TBCR: 40.0000 meq | EXTENDED_RELEASE_TABLET | Freq: Once | ORAL | Status: DC

## 2019-04-18 MED ORDER — POTASSIUM CHLORIDE CRYS ER 20 MEQ PO TBCR
40.0000 meq | EXTENDED_RELEASE_TABLET | Freq: Once | ORAL | Status: AC
  Administered 2019-04-18: 40 meq via ORAL
  Filled 2019-04-18: qty 2

## 2019-04-18 MED ORDER — SODIUM CHLORIDE 0.9% FLUSH
3.0000 mL | Freq: Once | INTRAVENOUS | Status: AC
  Administered 2019-04-18: 3 mL via INTRAVENOUS

## 2019-04-18 NOTE — ED Triage Notes (Signed)
Pt reports central CP that radiates to left with "flutters" shoulder x several days, states she felt like she was SOB this morning. Called her cardiologist who referred her here. Was tested for Covid 3 days ago so she could see her pulmonolgist today to see if she has sleep apne and asthma.

## 2019-04-18 NOTE — Telephone Encounter (Signed)
Follow Up:    Patient c/o Palpitations:  High priority if patient c/o lightheadedness, shortness of breath, or chest pain  1) How long have you had palpitations/irregular HR/ Afib? Are you having the symptoms now? iit is a flutter- a lot this morning  2) Are you currently experiencing lightheadedness, SOB or CP?Breahing is a little weird, a heaviness  3) Do you have a history of afib (atrial fibrillation) or irregular heart rhythm? Not sure how to describe  4) Have you checked your BP or HR? (document readings if available): 123/70 and pulse rate was 72  5) Are you experiencing any other symptoms?   a little chest difficulty with breathing

## 2019-04-18 NOTE — Telephone Encounter (Signed)
New message:    patient calling and would like for some one to call concering a CT. Patient is in the ER and would like to know if they can do while she is there. [facility] on [address].

## 2019-04-18 NOTE — Telephone Encounter (Signed)
Follow Up:     Pt says she needs to talk to a nurse, she have some questions , concerning her Metoprolol.. She would like if possible for you to call before 1:00 today please.

## 2019-04-18 NOTE — Telephone Encounter (Signed)
Spoke with pt, she stated she received her lab results. Nothing further is needed.

## 2019-04-18 NOTE — ED Provider Notes (Signed)
[place] EM[facility] DEPARTMENT Provider Note   CSN: [id number] Arrival date & time: 04/18/19  1341     History   Chief Complaint Chief Complaint  Patient presents with   Chest Pain    HPI Brooke Turner is a 43 y.o. female.     Patient c/o sense of palpitations for past 2-3 days. States symptoms aucte onset,  occur at rest, lasts seconds to minutes. At times feels tight in chest, and at other times has gas sensation. Denies heartburn. No constant and/or pleuritic pain. Denies exertional cp or discomfort. No unusual doe. No associated nv, or diaphoresis. Denies hx dysrhythmia. Had recent outpatient/event monitor - pvc noted, no other rhythm problem noted. Also reports recent echo/eval with Dr Percival Spanish for chest pain. Denies family hx premature cad. No leg pain or swelling. No recent immobility, trauma, travel, or surgery. No hx dvt or pe. Denies caffeine use. No change in meds except recent addition of metoprolol. No syncope. No heat intolerance, sweats, or wt change. No hx thyroid disease - prior tsh normal.   The history is provided by the patient.  Chest Pain Associated symptoms: palpitations   Associated symptoms: no abdominal pain, no back pain, no cough, no fever, no headache, no numbness, no shortness of breath, no vomiting and no weakness     Past Medical History:  Diagnosis Date   Anxiety    Back pain    Chest pain    a. 09/2014 Echo: EF 60-65%, no rwma, nl valves;  b. 12/2014 Neg ETT.   Essential hypertension    a. 01/2016 Renal duplex: no RAS- ? renal cyst (renal u/s 4/17 - no cyst, nl study); b. 02/2016 24 hr BP Monitor: Mean SBP ~ 138 with Max of 198. Highest pressures noted in evening hours following placement of cuff.   Fibroid uterus    GERD (gastroesophageal reflux disease)    Palpitations    a. 09/2014 nl event monitor.    Patient Active Problem List   Diagnosis Date Noted   History of posttraumatic stress disorder (PTSD)  12/31/2018   History of panic attacks 12/31/2018   Palpitations    Atypical chest pain    Chronic tension-type headache, not intractable 06/10/2015   Dyspnea 03/18/2015   Post-traumatic stress reaction 02/25/2015   Anxiety disorder due to general medical condition with panic attack 02/25/2015   Subserous leiomyoma of uterus 02/25/2015   Anxiety about health 02/25/2015   Deviated nasal septum 12/30/2014   Chronic rhinitis 12/30/2014   Chronic maxillary sinusitis 12/30/2014   Snorings 09/30/2014   Prediabetes 08/26/2014   Acanthosis nigricans 03/19/2014   Vitamin D deficiency 07/15/2013   Large breasts 09/12/2012   Essential hypertension 02/17/2012   Obesity (BMI 30-39.9) previous BMI was over 40. Patient has lost 70 pounds in the last year 02/17/2012   Anxiety disorder 02/17/2012   Chronic back pain 02/17/2012    Past Surgical History:  Procedure Laterality Date   WISDOM TOOTH EXTRACTION       OB History    Gravida  2   Para  0   Term  0   Preterm  0   AB  2   Living  0     SAB  1   TAB  1   Ectopic  0   Multiple  0   Live Births               Home Medications    Prior to Admission medications  Medication Sig Start Date End Date Taking? Authorizing Provider  acetaminophen (TYLENOL) 500 MG tablet Take 500 mg by mouth every 6 (six) hours as needed for mild pain.     [provider]  albuterol (PROVENTIL HFA;VENTOLIN HFA) 108 (90 Base) MCG/ACT inhaler Inhale 1-2 puffs into the lungs every 6 (six) hours as needed for wheezing or shortness of breath. 02/07/19   Alveria Apley, PA-C  Biotin w/ Vitamins C & E 1250-7.5-7.5 MCG-MG-UNT CHEW Chew by mouth.    [provider]  blood glucose meter kit and supplies Test blood sugar twice daily. Dx code: R73.09 07/22/15   [name], MD  famotidine (PEPCID) 20 MG tablet Take 1 tablet (20 mg total) by mouth 2 (two) times daily. 11/20/18   [name], MD  fluticasone (FLONASE)  50 MCG/ACT nasal spray Place 2 sprays into both nostrils daily. Patient taking differently: Place 2 sprays into both nostrils daily as needed (seasonal allergies).  10/10/17   Tenna Delaine D, PA-C  glucose blood (ONETOUCH VERIO) test strip TEST BLOOD SUGAR DAILY 11/16/18   [name], MD  LORazepam (ATIVAN) 0.5 MG tablet Take 1 tablet (0.5 mg total) by mouth every 6 (six) hours as needed for anxiety. Patient taking differently: Take 0.25 mg by mouth every 6 (six) hours as needed for anxiety.  03/19/19   Forrest Moron, MD  metoprolol tartrate (LOPRESSOR) 25 MG tablet 6.73m (1/4 tablet) twice daily 04/15/19   Kroeger, KLorelee Cover, PA-C  Multiple Vitamins-Minerals (MULTIVITAMIN GUMMIES ADULT PO) Take 2 tablets by mouth daily.     [provider]  Norethindrone-Ethinyl Estradiol-Fe Biphas (LO LOESTRIN FE) 1 MG-10 MCG / 10 MCG tablet Take 1 tablet by mouth at bedtime.     [provider]  OJonetta SpeakLANCETS 340HMISC USE TO TEST BLOOD SUGAR DAILY 11/16/18   SShawnee Knapp MD  OVER THE COUNTER MEDICATION Take 1 tablet by mouth daily. Vitamin B-12 gummy supplement. 10053m each gummy.     [provider]  pantoprazole (PROTONIX) 40 MG tablet Take 1 tablet (40 mg total) by mouth daily before breakfast. Patient not taking: Reported on 04/15/2019 03/13/19   BeThornton ParkMD    Family History Family History  Problem Relation Age of Onset   Seizures Mother    Epilepsy Mother    Cancer Father        Liver   Diabetes Father 6024 Hypertension Father    Heart disease Father 6063     CEA, LE [place]   Alzheimer's disease Maternal Grandmother    Heart disease Maternal Grandfather    Colon cancer Neg Hx     Social History Social History   Tobacco Use   Smoking status: Former Smoker    Packs/day: 0.25    Years: 10.00    Pack years: 2.50    Types: Cigarettes    Start date: 06/25/2000    Quit date: 08/19/2014    Years since quitting: 4.6   Smokeless  tobacco: Never Used  Substance Use Topics   Alcohol use: No    Alcohol/week: 0.0 standard drinks   Drug use: No     Allergies   Penicillins, Tamiflu [oseltamivir phosphate], Tinidazole, and Diltiazem   Review of Systems Review of Systems  Constitutional: Negative for chills and fever.  HENT: Negative for sore throat.   Eyes: Negative for redness.  Respiratory: Negative for cough and shortness of breath.   Cardiovascular: Positive for chest  pain and palpitations. Negative for leg swelling.  Gastrointestinal: Negative for abdominal pain, diarrhea and vomiting.  Genitourinary: Negative for flank pain.  Musculoskeletal: Negative for back pain and neck pain.  Skin: Negative for rash.  Neurological: Negative for syncope, weakness, numbness and headaches.  Hematological: Does not bruise/bleed easily.  Psychiatric/Behavioral: Negative for confusion.     Physical Exam Updated Vital Signs BP (!) 157/95    Pulse 92    Temp 98.1 F (36.7 C) (Oral)    Resp 15    Ht 1.626 m (5' 4" )    Wt 71.2 kg    LMP  (LMP Unknown) Comment: continuous bc    SpO2 100%    BMI 26.95 kg/m   Physical Exam Vitals signs and nursing note reviewed.  Constitutional:      Appearance: Normal appearance. She is well-developed.  HENT:     Head: Atraumatic.     Nose: Nose normal.     Mouth/Throat:     Mouth: Mucous membranes are moist.  Eyes:     General: No scleral icterus.    Conjunctiva/sclera: Conjunctivae normal.     Pupils: Pupils are equal, round, and reactive to light.  Neck:     Musculoskeletal: Normal range of motion and neck supple. No neck rigidity or muscular tenderness.     Trachea: No tracheal deviation.     Comments: Thyroid not tender, not enlarged.  Cardiovascular:     Rate and Rhythm: Normal rate and regular rhythm.     Pulses: Normal pulses.     Heart sounds: Normal heart sounds. No murmur. No friction rub. No gallop.   Pulmonary:     Effort: Pulmonary effort is normal. No  respiratory distress.     Breath sounds: Normal breath sounds.  Abdominal:     General: Bowel sounds are normal. There is no distension.     Palpations: Abdomen is soft.     Tenderness: There is no abdominal tenderness. There is no guarding.  Genitourinary:    Comments: No cva tenderness.  Musculoskeletal:        General: No swelling or tenderness.     Right lower leg: No edema.     Left lower leg: No edema.  Skin:    General: Skin is warm and dry.     Findings: No rash.  Neurological:     Mental Status: She is alert.     Comments: Alert, speech normal. Steady gait.   Psychiatric:        Mood and Affect: Mood normal.      ED Treatments / Results  Labs (all labs ordered are listed, but only abnormal results are displayed) Results for orders placed or performed during the hospital encounter of 62/37/62  Basic metabolic panel  Result Value Ref Range   Sodium 139 135 - 145 mmol/L   Potassium 3.2 (L) 3.5 - 5.1 mmol/L   Chloride 107 98 - 111 mmol/L   CO2 19 (L) 22 - 32 mmol/L   Glucose, Bld 129 (H) 70 - 99 mg/dL   BUN 11 6 - 20 mg/dL   Creatinine, Ser 1.16 (H) 0.44 - 1.00 mg/dL   Calcium 8.8 (L) 8.9 - 10.3 mg/dL   GFR calc non Af Amer 58 (L) >60 mL/min   GFR calc Af Amer >60 >60 mL/min   Anion gap 13 5 - 15  CBC  Result Value Ref Range   WBC 7.8 4.0 - 10.5 K/uL   RBC 4.54 3.87 - 5.11 MIL/uL  Hemoglobin 13.9 12.0 - 15.0 g/dL   HCT 40.6 36.0 - 46.0 %   MCV 89.4 80.0 - 100.0 fL   MCH 30.6 26.0 - 34.0 pg   MCHC 34.2 30.0 - 36.0 g/dL   RDW 12.1 11.5 - 15.5 %   Platelets 286 150 - 400 K/uL   nRBC 0.0 0.0 - 0.2 %  Troponin I - ONCE - STAT  Result Value Ref Range   Troponin I <0.03 <0.03 ng/mL  I-Stat beta hCG blood, ED  Result Value Ref Range   I-stat hCG, quantitative <5.0 <5 mIU/mL   Comment 3           Dg Chest 2 View  Result Date: 04/18/2019 CLINICAL DATA:  Chest pain. EXAM: CHEST - 2 VIEW COMPARISON:  Chest x-ray and CT chest dated Mar 09, 2019. FINDINGS: The  heart size and mediastinal contours are within normal limits. Both lungs are clear. The visualized skeletal structures are unremarkable. IMPRESSION: No active cardiopulmonary disease. Electronically Signed   By: Titus Dubin M.D.   On: 04/18/2019 14:38    EKG    Radiology Dg Chest 2 View  Result Date: 04/18/2019 CLINICAL DATA:  Chest pain. EXAM: CHEST - 2 VIEW COMPARISON:  Chest x-ray and CT chest dated Mar 09, 2019. FINDINGS: The heart size and mediastinal contours are within normal limits. Both lungs are clear. The visualized skeletal structures are unremarkable. IMPRESSION: No active cardiopulmonary disease. Electronically Signed   By: Titus Dubin M.D.   On: 04/18/2019 14:38    Procedures Procedures (including critical care time)  Medications Ordered in ED Medications  sodium chloride flush (NS) 0.9 % injection 3 mL (has no administration in time range)     Initial Impression / Assessment and Plan / ED Course  I have reviewed the triage vital signs and the nursing notes.  Pertinent labs & imaging results that were available during my care of the patient were reviewed by me and considered in my medical decision making (see chart for details).  Iv ns. Continuous pulse ox and monitor. Ecg. Cxr. Labs.   Reviewed nursing notes and prior charts for additional history.  Recent echo w normal ef, no acute process. Prior event monitor w ectopy, no other dysrhythmia noted.   CXR reviewed by me - no pna.   Labs reviewed by me - trop normal.   Recheck pt - normal sinus rhythm. No increased wob or distress.  Delta troponin is pending.  1610 signed out to Dr Rex Kras to check delta trop - if normal, anticipate d/c home with outpt f/u w her cardiologist.   Return precautions provided.     Final Clinical Impressions(s) / ED Diagnoses   Final diagnoses:  None    ED Discharge Orders    None       Lajean Saver, MD 04/18/19 1617

## 2019-04-18 NOTE — Telephone Encounter (Signed)
Spoke with pt. She report that she has been experiencing off and on chest pain and feeling her heart flutter every 5 minutes. Pt also report having an episode this morning where she was breathing heavier than normal. Pt voiced while on the phone she experienced the chest pain as she was waiting to be transferred. Pt instructed to report to ED for further evaluations. Pt voiced understanding and [facility] notified.

## 2019-04-18 NOTE — Telephone Encounter (Signed)
New Message     Patient returning your call please call her back.

## 2019-04-18 NOTE — Telephone Encounter (Signed)
Spoke with patient who is in ED. She is wanting to know if she can have her coronary CT test done since she is at the hospital now. She was asking if Mack Guise can call her to set this up - message sent. Suggested that patient mention this test was ordered by MD to the nurse and EDP as well.

## 2019-04-18 NOTE — Progress Notes (Signed)
Noted  

## 2019-04-18 NOTE — ED Notes (Signed)
Pt verbalized understanding of d/c instructions. Pt stable upon d/c

## 2019-04-18 NOTE — ED Provider Notes (Signed)
I received this patient in signout from Dr. Ashok Cordia.  He had presented with atypical chest pain and palpitations and we were awaiting a second troponin after 3 hours.  Repeat troponin normal.  Her lab work, chest x-ray, and EKG have been reassuring.  She tells me that she follows with cardiology and had an ECHO last week.  I have discussed work-up findings and cardiology follow-up with patient and reviewed return precautions.  She voiced understanding.   Little, Wenda Overland, MD 04/18/19 1739

## 2019-04-18 NOTE — Telephone Encounter (Signed)
Left message to call back  

## 2019-04-18 NOTE — Discharge Instructions (Addendum)
It was our pleasure to provide your ER care today - we hope that you feel better.  For chest discomfort/palpitations - follow up with your cardiologist in 1 week - call office to arrange appointment.  From today's lab tests, your potassium level is mildly low (3.2) - eat plenty of fruits and vegetables, take potassium supplement as prescribed, and follow up with your doctor in 1 week.   Return to ER if worse, new symptoms, fevers, recurrent/persistent chest pain, trouble breathing, persistent fast heart beat, fainting, other concern.

## 2019-04-18 NOTE — Telephone Encounter (Signed)
LMTCB

## 2019-04-19 ENCOUNTER — Ambulatory Visit (INDEPENDENT_AMBULATORY_CARE_PROVIDER_SITE_OTHER): Payer: Managed Care, Other (non HMO) | Admitting: Medical

## 2019-04-19 ENCOUNTER — Encounter: Payer: Self-pay | Admitting: Physician Assistant

## 2019-04-19 VITALS — BP 186/92 | HR 99 | Ht 64.0 in | Wt 158.4 lb

## 2019-04-19 DIAGNOSIS — F418 Other specified anxiety disorders: Secondary | ICD-10-CM | POA: Diagnosis not present

## 2019-04-19 DIAGNOSIS — I10 Essential (primary) hypertension: Secondary | ICD-10-CM

## 2019-04-19 DIAGNOSIS — R002 Palpitations: Secondary | ICD-10-CM | POA: Diagnosis not present

## 2019-04-19 DIAGNOSIS — R079 Chest pain, unspecified: Secondary | ICD-10-CM | POA: Diagnosis not present

## 2019-04-19 DIAGNOSIS — R4589 Other symptoms and signs involving emotional state: Secondary | ICD-10-CM

## 2019-04-19 MED ORDER — ATENOLOL 25 MG PO TABS: 12.5000 mg | ORAL_TABLET | Freq: Two times a day (BID) | ORAL | 3 refills | Status: DC

## 2019-04-19 NOTE — Telephone Encounter (Signed)
Pt was seen in ED and need blood work. Please call pt to discuss her medications.

## 2019-04-19 NOTE — Telephone Encounter (Signed)
Follow up    Patient is calling because she went to the ER last night. They told her to follow up with cardiology. She is wanting to know when she will be able to come in the office and be seen. Please call to discuss.

## 2019-04-19 NOTE — Progress Notes (Signed)
Cardiology Office Note   Date:  04/19/2019   ID:  Brooke Turner, DOB [birth year], MRN [id number]  PCP:  Forrest Moron, MD  Cardiologist:  Minus Breeding, MD EP: Virl Axe, MD  Chief Complaint  Patient presents with  . Palpitations      History of Present Illness: Brooke Turner is a 62SL  female with PMH of palpitations, chronic atypical chest pain, anxiety, pre-DM type 2, HTN, GERD, uterine fibroids, who presents for follow-up of her palpitations.   She was last evaluated by cardiology via a telemedicine visit with myself 04/15/2019, at which time she reported increasing frequency of her tachy-palpitations, particularly at night. Decision made to trial low dose metoprolol BID as an alternative to atenolol. She subsequently called our office reporting a fluttering sensation in her chest and chest pain. She was advised to present to the ED for further evaluation. She was examined in the ED 04/18/2019 where her EKG revealed sinus tachycardia, troponins were negative x2, and her labs were unremarkable with the exception of K 3.2. She was recommended to follow-up outpatient with cardiology.   She returns to the office today for follow-up of her palpitations. She reports overall improvement in her morning time palpitations mentioned at her visit 04/15/2019, however she has been experiencing a new fluttering in her chest which occurs every 5-10 minutes. Also with associated chest pain depending on the severity of the palpitation. She also feels that she is experiencing more SOB with metoprolol than atenolol. She reports blood pressures have been stable with SBP primarily in the 110s-130s at home. She brought her BP cuff to clinic today and there is a noticeable discrepancy of SBP ~45mHg less than our office monitor.  We discussed options going forward of continue trial of metoprolol, transitioning back to low dose carvedilol which she's tolerated in the past, or going back to  atenolol and trialing BID dosing.     Past Medical History:  Diagnosis Date  . Anxiety   . Back pain   . Chest pain    a. 09/2014 Echo: EF 60-65%, no rwma, nl valves;  b. 12/2014 Neg ETT.  . Essential hypertension    a. 01/2016 Renal duplex: no RAS- ? renal cyst (renal u/s 4/17 - no cyst, nl study); b. 02/2016 24 hr BP Monitor: Mean SBP ~ 138 with Max of 198. Highest pressures noted in evening hours following placement of cuff.  . Fibroid uterus   . GERD (gastroesophageal reflux disease)   . Palpitations    a. 09/2014 nl event monitor.    Past Surgical History:  Procedure Laterality Date  . WISDOM TOOTH EXTRACTION       Current Outpatient Medications  Medication Sig Dispense Refill  . acetaminophen (TYLENOL) 500 MG tablet Take 500 mg by mouth every 6 (six) hours as needed for mild pain.     .Marland Kitchenalbuterol (PROVENTIL HFA;VENTOLIN HFA) 108 (90 Base) MCG/ACT inhaler Inhale 1-2 puffs into the lungs every 6 (six) hours as needed for wheezing or shortness of breath. 1 Inhaler 0  . Biotin w/ Vitamins C & E 1250-7.5-7.5 MCG-MG-UNT CHEW Chew by mouth.    . blood glucose meter kit and supplies Test blood sugar twice daily. Dx code: R73.09 1 each 0  . famotidine (PEPCID) 20 MG tablet Take 1 tablet (20 mg total) by mouth 2 (two) times daily. 180 tablet 0  . fluticasone (FLONASE) 50 MCG/ACT nasal spray Place 2 sprays into both nostrils daily. (Patient taking differently:  Place 2 sprays into both nostrils daily as needed (seasonal allergies). ) 16 g 0  . glucose blood (ONETOUCH VERIO) test strip TEST BLOOD SUGAR DAILY 25 each 10  . LORazepam (ATIVAN) 0.5 MG tablet Take 1 tablet (0.5 mg total) by mouth every 6 (six) hours as needed for anxiety. (Patient taking differently: Take 0.25 mg by mouth every 6 (six) hours as needed for anxiety. ) 15 tablet 0  . Multiple Vitamins-Minerals (MULTIVITAMIN GUMMIES ADULT PO) Take 2 tablets by mouth daily.     . Norethindrone-Ethinyl Estradiol-Fe Biphas (LO LOESTRIN  FE) 1 MG-10 MCG / 10 MCG tablet Take 1 tablet by mouth at bedtime.     [name] DELICA LANCETS 67E MISC USE TO TEST BLOOD SUGAR DAILY 100 each 3  . OVER THE COUNTER MEDICATION Take 1 tablet by mouth daily. Vitamin B-12 gummy supplement. 1031mg each gummy.     . pantoprazole (PROTONIX) 40 MG tablet Take 1 tablet (40 mg total) by mouth daily before breakfast. 90 tablet 0  . potassium chloride SA (K-DUR) 20 MEQ tablet Take 1 tablet (20 mEq total) by mouth daily. 15 tablet 0  . atenolol (TENORMIN) 25 MG tablet Take 0.5 tablets (12.5 mg total) by mouth 2 (two) times daily. 60 tablet 3   Current Facility-Administered Medications  Medication Dose Route Frequency Provider Last Rate Last Dose  . cyanocobalamin ((VITAMIN B-12)) injection 1,000 mcg  1,000 mcg Intramuscular Q30 days SShawnee Knapp MD   1,000 mcg at 10/19/18 1837    Allergies:   Penicillins, Tamiflu [oseltamivir phosphate], Tinidazole, and Diltiazem    Social History:  The patient  reports that she quit smoking about 4 years ago. Her smoking use included cigarettes. She started smoking about 18 years ago. She has a 2.50 pack-year smoking history. She has never used smokeless tobacco. She reports that she does not drink alcohol or use drugs.   Family History:  The patient's family history includes Alzheimer's disease in her maternal grandmother; Cancer in her father; Diabetes (age of onset: 626 in her father; Epilepsy in her mother; Heart disease in her maternal grandfather; Heart disease (age of onset: 666 in her father; Hypertension in her father; Seizures in her mother.    ROS:  Please see the history of present illness.   Otherwise, review of systems are positive for none.   All other systems are reviewed and negative.    PHYSICAL EXAM: VS:  BP (!) 186/92   Pulse 99   Ht _0  (1.626 m)   Wt 158 lb 6.4 oz (71.8 kg)   LMP  (LMP Unknown) Comment: continuous bc   SpO2 100%   BMI 27.19 kg/m  , BMI Body mass index is 27.19 kg/m.  GEN: Well nourished, well developed, in no acute distress HEENT: sclera anicteric  Neck: no JVD, carotid bruits, or masses Cardiac: RRR; no murmurs, rubs, or gallops, no edema  Respiratory:  clear to auscultation bilaterally, normal work of breathing GI: soft, nontender, nondistended, + BS MS: no deformity or atrophy Skin: warm and dry, no rash Neuro:  Strength and sensation are intact Psych: euthymic mood, full affect, anxious   EKG:  EKG is ordered today. The ekg ordered today demonstrates sinus rhythm with rate 99 bpm, no STE/D, no TWI   Recent Labs: 10/08/2018: B Natriuretic Peptide 36.0 10/18/2018: Magnesium 2.1; TSH 0.933 02/28/2019: ALT 13 04/18/2019: BUN 11; Creatinine, Ser 1.16; Hemoglobin 13.9; Platelets 286; Potassium 3.2; Sodium 139    Lipid Panel  Component Value Date/Time   CHOL 172 10/25/2016 1850   TRIG 87 10/25/2016 1850   HDL 49 10/25/2016 1850   CHOLHDL 3.5 10/25/2016 1850   LDLCALC 106 (H) 10/25/2016 1850   LDLDIRECT 101 (H) 10/25/2016 1850      Wt Readings from Last 3 Encounters:  04/19/19 158 lb 6.4 oz (71.8 kg)  04/18/19 157 lb (71.2 kg)  04/15/19 158 lb (71.7 kg)      Other studies Reviewed: Additional studies/ records that were reviewed today include:   Echocardiogram 04/10/2019: IMPRESSIONS   1. The left ventricle has normal systolic function with an ejection fraction of 60-65%. The cavity size was normal. Left ventricular diastolic Doppler parameters are consistent with impaired relaxation. No evidence of left ventricular regional wall  motion abnormalities. 2. The right ventricle has normal systolic function. The cavity was normal. There is no increase in right ventricular wall thickness. 3. No evidence of mitral valve stenosis. No significant regurgitation. 4. The aortic valve is tricuspid. No stenosis of the aortic valve. 5. The aortic root is normal in size and structure. 6. Normal IVC size. No complete TR doppler jet so  unable to estimate PA systolic pressure.  Cardiac event monitor 03/26/2019: Normal sinus rhythm Rare ectopy No significant arrhythmias Complaints including chest pain and fluttering did not correlate with arrhythmia Symptoms occurred with NSR    ASSESSMENT AND PLAN:  1. Tachy-palpitations: now with worsening fluttering in her chest throughout the day on metoprolol 6.38m BID. We discussed alternative BBlocker options and ultimately she preferred going back on atenolol.  - Will stop metoprolol - Will restart atenolol 6.2265mBID with plans to uptitrate to 12.65m73mID as tolerated - hopeful twice daily dosing will maintain a steadier HR - Will check TSH per patient request, though levels 10/2018 were normal and no significant symptoms concerning for hyper/hypothryoidism besides palpitations  2. Atypical chest pain: Risk factors for CAD include HTN and borderline DM type 2 - Coronary CTA scheduled for 04/26/2019.  3. HTN: BP significantly elevated today; repeat during my exam 160s/80s. Also with discrepancy between office monitor and home monitor.  - Will restart atenolol 6.265m565mD  - Patient instructed to obtain a new BP cuff and to continue keeping a log of her blood pressures - Hesitant to initiate additional BP medications at this time given sensitivity to medications - want to introduce meds in a stepwise approach.   4. Anxiety: Still suspect this is contributing to #1. She reports good control with prn ativan - Continue management per PCP.     Current medicines are reviewed at length with the patient today.  The patient does not have concerns regarding medicines.  The following changes have been made:  Stop metoprolol and start atenolol 6.265mg77m  Labs/ tests ordered today include:   Orders Placed This Encounter  Procedures  . TSH  . EKG 12-Lead     Disposition:   FU with Dr. HochrPercival Spanish/2020 as previously scheduled   Signed, KristAbigail ButtsC  04/19/2019 9:38  PM

## 2019-04-19 NOTE — Patient Instructions (Addendum)
Medication Instructions:  START Atenolol 12.5mg  take 1 tablet twice a day STOP Metoprolol  If you need a refill on your cardiac medications before your next appointment, please call your pharmacy.   Lab work: Your physician recommends that you return for lab work in: [place]  If you have labs (blood work) drawn today and your tests are completely normal, you will receive your results only by: Marland Kitchen MyChart Message (if you have MyChart) OR . A paper copy in the mail If you have any lab test that is abnormal or we need to change your treatment, we will call you to review the results.  Testing/Procedures: None   Follow-Up: At [facility], you and your health needs are our priority.  As part of our continuing mission to provide you with exceptional heart care, we have created designated Provider Care Teams.  These Care Teams include your primary Cardiologist (physician) and Advanced Practice Providers (APPs -  Physician Assistants and Nurse Practitioners) who all work together to provide you with the care you need, when you need it. . Follow up with Dr Percival Spanish as scheduled  Any Other Special Instructions Will Be Listed Below (If Applicable).

## 2019-04-19 NOTE — Telephone Encounter (Signed)
Spoke with patient and she continues to have fluttering and was told to follow up with cardiologist in ED yesterday. Patient requested in office visit as she has had recent virtual visits. Scheduled appointment with A Duke PA today.   Patient denies any COVID 19 symptoms and stated she was tested previously

## 2019-04-20 LAB — TSH: TSH: 0.6 u[IU]/mL (ref 0.450–4.500)

## 2019-04-21 ENCOUNTER — Other Ambulatory Visit: Payer: Self-pay

## 2019-04-21 ENCOUNTER — Encounter (HOSPITAL_COMMUNITY): Payer: Self-pay | Admitting: Emergency Medicine

## 2019-04-21 ENCOUNTER — Emergency Department (HOSPITAL_COMMUNITY)
Admission: EM | Admit: 2019-04-21 | Discharge: 2019-04-22 | Disposition: A | Discharge status: Home / Self Care | Payer: Managed Care, Other (non HMO) | Attending: Emergency Medicine | Admitting: Emergency Medicine

## 2019-04-21 ENCOUNTER — Emergency Department (HOSPITAL_COMMUNITY): Payer: Managed Care, Other (non HMO)

## 2019-04-21 DIAGNOSIS — Z87891 Personal history of nicotine dependence: Secondary | ICD-10-CM | POA: Diagnosis not present

## 2019-04-21 DIAGNOSIS — R0789 Other chest pain: Secondary | ICD-10-CM | POA: Insufficient documentation

## 2019-04-21 DIAGNOSIS — R079 Chest pain, unspecified: Secondary | ICD-10-CM | POA: Diagnosis present

## 2019-04-21 DIAGNOSIS — Z79899 Other long term (current) drug therapy: Secondary | ICD-10-CM | POA: Insufficient documentation

## 2019-04-21 DIAGNOSIS — I10 Essential (primary) hypertension: Secondary | ICD-10-CM | POA: Insufficient documentation

## 2019-04-21 LAB — BASIC METABOLIC PANEL
Anion gap: 12 (ref 5–15)
BUN: 10 mg/dL (ref 6–20)
CO2: 19 mmol/L — ABNORMAL LOW (ref 22–32)
Calcium: 8.8 mg/dL — ABNORMAL LOW (ref 8.9–10.3)
Chloride: 107 mmol/L (ref 98–111)
Creatinine, Ser: 1.13 mg/dL — ABNORMAL HIGH (ref 0.44–1.00)
GFR calc Af Amer: >60 mL/min (ref >60)
GFR calc non Af Amer: 59 mL/min — ABNORMAL LOW (ref >60)
Glucose, Bld: 94 mg/dL (ref 70–99)
Potassium: 3.4 mmol/L — ABNORMAL LOW (ref 3.5–5.1)
Sodium: 138 mmol/L (ref 135–145)

## 2019-04-21 LAB — TROPONIN I: Troponin I: <0.03 ng/mL (ref <0.03)

## 2019-04-21 LAB — CBC
HCT: 38.8 % (ref 36.0–46.0)
Hemoglobin: 13.3 g/dL (ref 12.0–15.0)
MCH: 30.6 pg (ref 26.0–34.0)
MCHC: 34.3 g/dL (ref 30.0–36.0)
MCV: 89.4 fL (ref 80.0–100.0)
Platelets: 251 10*3/uL (ref 150–400)
RBC: 4.34 MIL/uL (ref 3.87–5.11)
RDW: 12 % (ref 11.5–15.5)
WBC: 6.5 10*3/uL (ref 4.0–10.5)
nRBC: 0 % (ref 0.0–0.2)

## 2019-04-21 LAB — I-STAT BETA HCG BLOOD, ED (MC, WL, AP ONLY): I-stat hCG, quantitative: <5 m[IU]/mL (ref <5)

## 2019-04-21 LAB — PROTIME-INR
INR: 1.1 (ref 0.8–1.2)
Prothrombin Time: 14 seconds (ref 11.4–15.2)

## 2019-04-21 MED ORDER — SODIUM CHLORIDE 0.9% FLUSH: 3.0000 mL | Freq: Once | INTRAVENOUS | Status: DC

## 2019-04-21 MED ORDER — ALUM & MAG HYDROXIDE-SIMETH 200-200-20 MG/5ML PO SUSP
30.0000 mL | Freq: Once | ORAL | Status: AC
  Administered 2019-04-21: 30 mL via ORAL
  Filled 2019-04-21: qty 30

## 2019-04-21 MED ORDER — SIMETHICONE 125 MG PO CHEW: 125.0000 mg | CHEWABLE_TABLET | Freq: Four times a day (QID) | ORAL | 0 refills | Status: DC | PRN

## 2019-04-21 NOTE — ED Provider Notes (Signed)
[place] EMERGENCY DEPARTMENT Provider Note   CSN: [id number] Arrival date & time: 04/21/19  1945    History   Chief Complaint Chief Complaint  Patient presents with  . Chest Pain    HPI Brooke Turner is a 43 y.o. female.     HPI Patient reports she has a pressure sensation all in her chest.  She reports it feels like she has "air in her chest.  She reports that she was having a lot of belching several days ago.  Did not really seem to relieve all the pressure discomfort.  She reports she also is intermittently getting palpitations and fluttering of her heart.  Has been going on intermittently all week.  Patient does see gastroenterology.  She was prescribed a medication to start for reflux symptoms.  Has not started it yet. Past Medical History:  Diagnosis Date  . Anxiety   . Back pain   . Chest pain    a. 09/2014 Echo: EF 60-65%, no rwma, nl valves;  b. 12/2014 Neg ETT.  . Essential hypertension    a. 01/2016 Renal duplex: no RAS- ? renal cyst (renal u/s 4/17 - no cyst, nl study); b. 02/2016 24 hr BP Monitor: Mean SBP ~ 138 with Max of 198. Highest pressures noted in evening hours following placement of cuff.  . Fibroid uterus   . GERD (gastroesophageal reflux disease)   . Palpitations    a. 09/2014 nl event monitor.    Patient Active Problem List   Diagnosis Date Noted  . History of posttraumatic stress disorder (PTSD) 12/31/2018  . History of panic attacks 12/31/2018  . Palpitations   . Atypical chest pain   . Chronic tension-type headache, not intractable 06/10/2015  . Dyspnea 03/18/2015  . Post-traumatic stress reaction 02/25/2015  . Anxiety disorder due to general medical condition with panic attack 02/25/2015  . Subserous leiomyoma of uterus 02/25/2015  . Anxiety about health 02/25/2015  . Deviated nasal septum 12/30/2014  . Chronic rhinitis 12/30/2014  . Chronic maxillary sinusitis 12/30/2014  . Snorings 09/30/2014  .  Prediabetes 08/26/2014  . Acanthosis nigricans 03/19/2014  . Vitamin D deficiency 07/15/2013  . Large breasts 09/12/2012  . Essential hypertension 02/17/2012  . Obesity (BMI 30-39.9) previous BMI was over 40. Patient has lost 70 pounds in the last year 02/17/2012  . Anxiety disorder 02/17/2012  . Chronic back pain 02/17/2012    Past Surgical History:  Procedure Laterality Date  . WISDOM TOOTH EXTRACTION       OB History    Gravida  2   Para  0   Term  0   Preterm  0   AB  2   Living  0     SAB  1   TAB  1   Ectopic  0   Multiple  0   Live Births               Home Medications    Prior to Admission medications   Medication Sig Start Date End Date Taking? Authorizing Provider  acetaminophen (TYLENOL) 500 MG tablet Take 500 mg by mouth every 6 (six) hours as needed (back pain/ cramps).    Yes [provider]  albuterol (PROVENTIL HFA;VENTOLIN HFA) 108 (90 Base) MCG/ACT inhaler Inhale 1-2 puffs into the lungs every 6 (six) hours as needed for wheezing or shortness of breath. 02/07/19  Yes Madilyn Hook A, PA-C  ALPRAZolam (NIRAVAM) 0.25 MG dissolvable tablet Take 0.25 mg by  mouth at bedtime as needed for anxiety.   Yes [provider]  atenolol (TENORMIN) 25 MG tablet Take 0.5 tablets (12.5 mg total) by mouth 2 (two) times daily. Patient taking differently: Take 6.25 mg by mouth 2 (two) times daily. 1/4 tablet 04/19/19 04/08/21 Yes Kroeger, Daleen Snook M., PA-C  Cyanocobalamin (VITAMIN B-12 PO) Take 1 tablet by mouth daily.   Yes [provider]  famotidine (PEPCID) 20 MG tablet Take 1 tablet (20 mg total) by mouth 2 (two) times daily. 11/20/18  Yes [name], MD  fluticasone [facility]) 50 MCG/ACT nasal spray Place 2 sprays into both nostrils daily. Patient taking differently: Place 2 sprays into both nostrils daily as needed (seasonal allergies).  10/10/17  Yes Timmothy Euler, [place] D, PA-C  LORazepam (ATIVAN) 0.5 MG tablet Take 1 tablet (0.5 mg  total) by mouth every 6 (six) hours as needed for anxiety. Patient taking differently: Take 0.25 mg by mouth 3 (three) times daily as needed for anxiety.  03/19/19  Yes Forrest Moron, MD  Multiple Vitamins-Minerals (MULTIVITAMIN GUMMIES ADULT PO) Take 2 tablets by mouth daily.    Yes [provider]  Norethindrone-Ethinyl Estradiol-Fe Biphas (LO LOESTRIN FE) 1 MG-10 MCG / 10 MCG tablet Take 1 tablet by mouth at bedtime.    Yes [provider]  pantoprazole (PROTONIX) 40 MG tablet Take 1 tablet (40 mg total) by mouth daily before breakfast. 03/13/19  Yes Thornton Park, MD  potassium chloride SA (K-DUR) 20 MEQ tablet Take 1 tablet (20 mEq total) by mouth daily. 04/18/19  Yes Lajean Saver, MD  blood glucose meter kit and supplies Test blood sugar twice daily. Dx code: R73.09 07/22/15   [name], MD  glucose blood [facility] VERIO) test strip TEST BLOOD SUGAR DAILY 11/16/18   [name], MD  [facility] DELICA LANCETS 79X MISC USE TO TEST BLOOD SUGAR DAILY 11/16/18   [name], MD  simethicone (MYLICON) 505 MG chewable tablet Chew 1 tablet (125 mg total) by mouth every 6 (six) hours as needed for flatulence. 04/21/19   Charlesetta Shanks, MD    Family History Family History  Problem Relation Age of Onset  . Seizures Mother   . Epilepsy Mother   . Cancer Father        Liver  . Diabetes Father 74  . Hypertension Father   . Heart disease Father 78       CEA, LE [place]  . Alzheimer's disease Maternal Grandmother   . Heart disease Maternal Grandfather   . Colon cancer Neg Hx     Social History Social History   Tobacco Use  . Smoking status: Former Smoker    Packs/day: 0.25    Years: 10.00    Pack years: 2.50    Types: Cigarettes    Start date: 06/25/2000    Quit date: 08/19/2014    Years since quitting: 4.6  . Smokeless tobacco: Never Used  Substance Use Topics  . Alcohol use: No    Alcohol/week: 0.0 standard drinks  . Drug use: No     Allergies    Penicillins, Tamiflu [oseltamivir phosphate], Tinidazole, and Diltiazem   Review of Systems Review of Systems 10 Systems reviewed and are negative for acute change except as noted in the HPI.   Physical Exam Updated Vital Signs BP 120/68   Pulse 72   Temp 98.8 F (37.1 C)   Resp 16   LMP  (LMP Unknown) Comment: continuous bc   SpO2 100%  Physical Exam Constitutional:      Appearance: She is well-developed.  HENT:     Head: Normocephalic and atraumatic.  Eyes:     Pupils: Pupils are equal, round, and reactive to light.  Neck:     Musculoskeletal: Neck supple.  Cardiovascular:     Rate and Rhythm: Normal rate and regular rhythm.     Heart sounds: Normal heart sounds.  Pulmonary:     Effort: Pulmonary effort is normal.     Breath sounds: Normal breath sounds.  Abdominal:     General: Bowel sounds are normal. There is no distension.     Palpations: Abdomen is soft.     Tenderness: There is no abdominal tenderness.  Musculoskeletal: Normal range of motion.        General: No swelling or tenderness.     Right lower leg: No edema.     Left lower leg: No edema.  Skin:    General: Skin is warm and dry.  Neurological:     Mental Status: She is alert and oriented to person, place, and time.     GCS: GCS eye subscore is 4. GCS verbal subscore is 5. GCS motor subscore is 6.     Coordination: Coordination normal.  Psychiatric:        Mood and Affect: Mood normal.      ED Treatments / Results  Labs (all labs ordered are listed, but only abnormal results are displayed) Labs Reviewed  BASIC METABOLIC PANEL - Abnormal; Notable for the following components:      Result Value   Potassium 3.4 (*)    CO2 19 (*)    Creatinine, Ser 1.13 (*)    Calcium 8.8 (*)    GFR calc non Af Amer 59 (*)    All other components within normal limits  CBC  TROPONIN I  PROTIME-INR  I-STAT BETA HCG BLOOD, ED ([facility], [facility], [facility] ONLY)    EKG EKG Interpretation  Date/Time:  Sunday April 21 2019  19:50:15 EDT Ventricular Rate:  108 PR Interval:  160 QRS Duration: 88 QT Interval:  336 QTC Calculation: 450 R Axis:   75 Text Interpretation:  Sinus tachycardia Otherwise normal ECG agree, no change. Confirmed by Charlesetta Shanks [phone]) on 04/21/2019 10:38:25 PM   Radiology Dg Chest 2 View  Result Date: 04/21/2019 CLINICAL DATA:  Chest pain EXAM: CHEST - 2 VIEW COMPARISON:  04/18/2019 FINDINGS: Normal heart size, mediastinal contours, and pulmonary vascularity. Lungs clear. No pulmonary infiltrate, pleural effusion or pneumothorax. Osseous structures unremarkable. IMPRESSION: No acute abnormalities. Electronically Signed   By: Lavonia Dana M.D.   On: 04/21/2019 21:12    Procedures Procedures (including critical care time)  Medications Ordered in ED Medications  sodium chloride flush (NS) 0.9 % injection 3 mL (3 mLs Intravenous Not Given 04/21/19 2046)  alum & mag hydroxide-simeth (MAALOX/MYLANTA) 200-200-20 MG/5ML suspension 30 mL (30 mLs Oral Given 04/21/19 2322)     Initial Impression / Assessment and Plan / ED Course  I have reviewed the triage vital signs and the nursing notes.  Pertinent labs & imaging results that were available during my care of the patient were reviewed by me and considered in my medical decision making (see chart for details).       Patient is clinically well in appearance.  She has had persistent symptoms all week.  Has had extensive evaluation in the past.  She had an echo a little over a week ago.  No acute findings.  Patient does see cardiology.  She has previously been on atenolol and metoprolol for palpitations.  Also has a gastroenterologist.  At this time all findings are stable.  Patient is well in appearance.  I feel she is stable for symptomatic treatment for what seems likely to be GI symptoms.  Will recommend some simethicone for gas and patient reports she already has a reflux medication prescribed just recently through GI that she has not  started.  She will start that and continue to work with her providers.  Final Clinical Impressions(s) / ED Diagnoses   Final diagnoses:  Atypical chest pain    ED Discharge Orders         Ordered    simethicone (MYLICON) 349 MG chewable tablet  Every 6 hours PRN     04/21/19 2331           Charlesetta Shanks, MD 04/21/19 2335

## 2019-04-21 NOTE — ED Triage Notes (Signed)
Patient reports central chest pressure/tightness with mild SOB  And palpitations , pt. stated " feels like gas /belching") , denies emesis or diaphoresis , pt. added mid back pain for several months .

## 2019-04-22 ENCOUNTER — Telehealth: Payer: Self-pay | Admitting: Family Medicine

## 2019-04-22 NOTE — Telephone Encounter (Signed)
Pt is requesting a call to discuss medication

## 2019-04-22 NOTE — Telephone Encounter (Signed)
Patient stated she called 2 weeks ago to get an appointment and talk to clinical and nobody returned her call she has been in the ED 2x in 5 days and is demanding to see Dr. Nolon Rod either tomorrow or Wednesday  An appointment was set with Dr. Holly Bodily and she is now refusing that appointment because she stated she does not know her  She wants a phone call back no later than 10am tomorrow morning

## 2019-04-23 ENCOUNTER — Telehealth: Payer: Self-pay | Admitting: Cardiology

## 2019-04-23 NOTE — Telephone Encounter (Signed)
Pt has appointment with Dr. Holly Bodily tomorrow.

## 2019-04-23 NOTE — Telephone Encounter (Signed)
New Message   Patient states that she was seen in the ER a few days ago. She would like to discuss that visit as well as the palpitations that she is having. She states that they are more frequent and she is concerned. Please call to discuss.

## 2019-04-23 NOTE — Telephone Encounter (Signed)
Spoke with pt, she is very anxious about the fluttering feeling she is having. She is talking very fast and reports the palpitations are different from the PVC and PAC she has had before. She did not have this while wearing the monitor. She recently switched back to atenolol from metoprolol and her side effects are better. She is going to her medical doctor tomorrow and will ask them to do an ekg while she is having the palpitation. She wanted to be seen prior to her CT scan appointment Friday, no appointments available. She asked I forward this message to [place] as she saw her last week. Aware Daleen Snook is not in the office today but will forward to her.

## 2019-04-24 ENCOUNTER — Ambulatory Visit (INDEPENDENT_AMBULATORY_CARE_PROVIDER_SITE_OTHER): Payer: Managed Care, Other (non HMO) | Admitting: Family Medicine

## 2019-04-24 ENCOUNTER — Encounter: Payer: Self-pay | Admitting: Family Medicine

## 2019-04-24 ENCOUNTER — Other Ambulatory Visit: Payer: Self-pay

## 2019-04-24 VITALS — BP 152/88 | HR 71 | Temp 98.6°F | Resp 18 | Ht 63.78 in | Wt 159.2 lb

## 2019-04-24 DIAGNOSIS — R002 Palpitations: Secondary | ICD-10-CM | POA: Diagnosis not present

## 2019-04-24 DIAGNOSIS — R7309 Other abnormal glucose: Secondary | ICD-10-CM

## 2019-04-24 DIAGNOSIS — E876 Hypokalemia: Secondary | ICD-10-CM | POA: Diagnosis not present

## 2019-04-24 DIAGNOSIS — Z09 Encounter for follow-up examination after completed treatment for conditions other than malignant neoplasm: Secondary | ICD-10-CM | POA: Diagnosis not present

## 2019-04-24 NOTE — Patient Instructions (Signed)
° ° ° °  If you have lab work done today you will be contacted with your lab results within the next 2 weeks.  If you have not heard from us then please contact us. The fastest way to get your results is to register for My Chart. ° ° °IF you received an x-ray today, you will receive an invoice from [facility]. Please contact [facility] at [phone] with questions or concerns regarding your invoice.  ° °IF you received labwork today, you will receive an invoice from [facility]. Please contact [facility] at [phone] with questions or concerns regarding your invoice.  ° °Our billing staff will not be able to assist you with questions regarding bills from these companies. ° °You will be contacted with the lab results as soon as they are available. The fastest way to get your results is to activate your My Chart account. Instructions are located on the last page of this paperwork. If you have not heard from us regarding the results in 2 weeks, please contact this office. °  ° ° ° °

## 2019-04-24 NOTE — Progress Notes (Signed)
Acute Office Visit  Subjective:    Patient ID: Brooke Turner, female    DOB: [birth year], 43 y.o.   MRN: [id number]  Chief Complaint  Patient presents with  . Hospitalization Follow-up    on chest pain- pt would like EKG  . Labs Only    HPI Patient is in today for on-going chest pain, anxiety, low potassium and request for A1c due to elevated glucose. Pt extremely anxiety about palpitations-seen by cardiology with appt on Friday.  Pt seen in ER with belching relieving chest pain and fluttering sensation.  Given GERD meds by GI but has not started medication. Pt with low K in ER-started taking Potassium per pt. Elevated glucose per pt-94 in ER-requested an A1c as states glucose previously elevated. Pt states she is considering taking zoloft of celexa as suggested by Dr. Nolon Rod. Pt's mother takes celexa and pt is concerned her emotions are to flat. Pt also concerned about sexual side effects of medication  Past Medical History:  Diagnosis Date  . Anxiety   . Back pain   . Chest pain    a. 09/2014 Echo: EF 60-65%, no rwma, nl valves;  b. 12/2014 Neg ETT.  . Essential hypertension    a. 01/2016 Renal duplex: no RAS- ? renal cyst (renal u/s 4/17 - no cyst, nl study); b. 02/2016 24 hr BP Monitor: Mean SBP ~ 138 with Max of 198. Highest pressures noted in evening hours following placement of cuff.  . Fibroid uterus   . GERD (gastroesophageal reflux disease)   . Palpitations    a. 09/2014 nl event monitor.    Past Surgical History:  Procedure Laterality Date  . WISDOM TOOTH EXTRACTION      Family History  Problem Relation Age of Onset  . Seizures Mother   . Epilepsy Mother   . Cancer Father        Liver  . Diabetes Father 42  . Hypertension Father   . Heart disease Father 105       CEA, LE [place]enting  . Alzheimer's disease Maternal Grandmother   . Heart disease Maternal Grandfather   . Colon cancer Neg Hx     Social History   Socioeconomic History  . Marital  status: Single    Spouse name: N/A  . Number of children: 0  . Years of education: 62  . Highest education level: Not on file  Occupational History  . Occupation: Research scientist (physical sciences): [employer]  Social Needs  . Financial resource strain: Not on file  . Food insecurity    Worry: Not on file    Inability: Not on file  . Transportation needs    Medical: Not on file    Non-medical: Not on file  Tobacco Use  . Smoking status: Former Smoker    Packs/day: 0.25    Years: 10.00    Pack years: 2.50    Types: Cigarettes    Start date: 06/25/2000    Quit date: 08/19/2014    Years since quitting: 4.6  . Smokeless tobacco: Never Used  Substance and Sexual Activity  . Alcohol use: No    Alcohol/week: 0.0 standard drinks  . Drug use: No  . Sexual activity: Not Currently    Partners: Male    Birth control/protection: Pill  Lifestyle  . Physical activity    Days per week: Not on file    Minutes per session: Not on file  . Stress: Not on file  Relationships  .  Social Herbalist on phone: Not on file    Gets together: Not on file    Attends religious service: Not on file    Active member of club or organization: Not on file    Attends meetings of clubs or organizations: Not on file    Relationship status: Not on file  . Intimate partner violence    Fear of current or ex partner: Not on file    Emotionally abused: Not on file    Physically abused: Not on file    Forced sexual activity: Not on file  Other Topics Concern  . Not on file  Social History Narrative   Patient lives at home alone .   Patient works full time at [employer]   Education college.   Right handed.   Caffeine none    Outpatient Medications Prior to Visit  Medication Sig Dispense Refill  . acetaminophen (TYLENOL) 500 MG tablet Take 500 mg by mouth every 6 (six) hours as needed (back pain/ cramps).     Marland Kitchen albuterol (PROVENTIL HFA;VENTOLIN HFA) 108 (90 Base) MCG/ACT inhaler Inhale 1-2  puffs into the lungs every 6 (six) hours as needed for wheezing or shortness of breath. 1 Inhaler 0  . ALPRAZolam (NIRAVAM) 0.25 MG dissolvable tablet Take 0.25 mg by mouth at bedtime as needed for anxiety.    Marland Kitchen atenolol (TENORMIN) 25 MG tablet Take 0.5 tablets (12.5 mg total) by mouth 2 (two) times daily. (Patient taking differently: Take 6.25 mg by mouth 2 (two) times daily. 1/4 tablet) 60 tablet 3  . blood glucose meter kit and supplies Test blood sugar twice daily. Dx code: R73.09 1 each 0  . Cyanocobalamin (VITAMIN B-12 PO) Take 1 tablet by mouth daily.    . famotidine (PEPCID) 20 MG tablet Take 1 tablet (20 mg total) by mouth 2 (two) times daily. 180 tablet 0  . fluticasone (FLONASE) 50 MCG/ACT nasal spray Place 2 sprays into both nostrils daily. (Patient taking differently: Place 2 sprays into both nostrils daily as needed (seasonal allergies). ) 16 g 0  . glucose blood (ONETOUCH VERIO) test strip TEST BLOOD SUGAR DAILY 25 each 10  . LORazepam (ATIVAN) 0.5 MG tablet Take 1 tablet (0.5 mg total) by mouth every 6 (six) hours as needed for anxiety. (Patient taking differently: Take 0.25 mg by mouth 3 (three) times daily as needed for anxiety. ) 15 tablet 0  . Multiple Vitamins-Minerals (MULTIVITAMIN GUMMIES ADULT PO) Take 2 tablets by mouth daily.     . Norethindrone-Ethinyl Estradiol-Fe Biphas (LO LOESTRIN FE) 1 MG-10 MCG / 10 MCG tablet Take 1 tablet by mouth at bedtime.     [name] DELICA LANCETS 62X MISC USE TO TEST BLOOD SUGAR DAILY 100 each 3  . pantoprazole (PROTONIX) 40 MG tablet Take 1 tablet (40 mg total) by mouth daily before breakfast. 90 tablet 0  . potassium chloride SA (K-DUR) 20 MEQ tablet Take 1 tablet (20 mEq total) by mouth daily. 15 tablet 0  . simethicone (MYLICON) 528 MG chewable tablet Chew 1 tablet (125 mg total) by mouth every 6 (six) hours as needed for flatulence. 30 tablet 0   Facility-Administered Medications Prior to Visit  Medication Dose Route Frequency  Provider Last Rate Last Dose  . cyanocobalamin ((VITAMIN B-12)) injection 1,000 mcg  1,000 mcg Intramuscular Q30 days [name], MD   1,000 mcg at 10/19/18 1837    Allergies  Allergen Reactions  . Penicillins Hives  Has patient had a PCN reaction causing immediate rash, facial/tongue/throat swelling, SOB or lightheadedness with hypotension:Yes Has patient had a PCN reaction causing severe rash involving mucus membranes or skin necrosis:unsure Has patient had a PCN reaction that required hospitalization: Yes Has patient had a PCN reaction occurring within the last 10 years: No If all of the above answers are "NO", then may proceed with Cephalosporin use.     . Tamiflu [Oseltamivir Phosphate] Nausea And Vomiting    Excessive vomiting  . Tinidazole Other (See Comments)    Caused severe abd pain/type of colitis   . Diltiazem Other (See Comments)    Dizzy, felt like she was going to pass out Blood pressure went up per patient     Review of Systems  Constitutional: Negative for chills, fever and malaise/fatigue.  Respiratory: Negative for cough.   Cardiovascular: Positive for chest pain and palpitations. Negative for leg swelling.  Gastrointestinal: Positive for heartburn. Negative for constipation and diarrhea.  Psychiatric/Behavioral: The patient is nervous/anxious.        Objective:    Physical Exam  Constitutional: She is oriented to person, place, and time. She appears well-developed and well-nourished.  HENT:  Head: Normocephalic and atraumatic.  Cardiovascular: Normal rate and regular rhythm.  Pulmonary/Chest: Effort normal and breath sounds normal.  Neurological: She is alert and oriented to person, place, and time.  pt mood was anxious-pressured speech when discussing starting SSRI  BP (!) 152/88 (BP Location: Right Arm, Patient Position: Sitting, Cuff Size: Normal)   Pulse 71   Temp 98.6 F (37 C) (Oral)   Resp 18   Ht 5' 3.78" (1.62 m)   Wt 159 lb 3.2 oz  (72.2 kg)   LMP 03/23/2018 Comment: continuous bc   SpO2 100%   BMI 27.52 kg/m  Wt Readings from Last 3 Encounters:  04/24/19 159 lb 3.2 oz (72.2 kg)  04/19/19 158 lb 6.4 oz (71.8 kg)  04/18/19 157 lb (71.2 kg)     Lab Results  Component Value Date   TSH 0.600 04/19/2019   Lab Results  Component Value Date   WBC 6.5 04/21/2019   HGB 13.3 04/21/2019   HCT 38.8 04/21/2019   MCV 89.4 04/21/2019   PLT 251 04/21/2019   Lab Results  Component Value Date   NA 138 04/21/2019   K 3.4 (L) 04/21/2019   CO2 19 (L) 04/21/2019   GLUCOSE 94 04/21/2019   BUN 10 04/21/2019   CREATININE 1.13 (H) 04/21/2019   BILITOT 1.3 (H) 02/28/2019   ALKPHOS 36 (L) 02/28/2019   AST 16 02/28/2019   ALT 13 02/28/2019   PROT 6.9 02/28/2019   ALBUMIN 4.1 02/28/2019   CALCIUM 8.8 (L) 04/21/2019   ANIONGAP 12 04/21/2019   GFR 70.52 02/28/2019   Lab Results  Component Value Date   CHOL 172 10/25/2016   Lab Results  Component Value Date   HDL 49 10/25/2016   Lab Results  Component Value Date   LDLCALC 106 (H) 10/25/2016   Lab Results  Component Value Date   TRIG 87 10/25/2016   Lab Results  Component Value Date   CHOLHDL 3.5 10/25/2016   Lab Results  Component Value Date   HGBA1C 5.7 (A) 10/19/2018       Assessment & Plan:   Problem List Items Addressed This Visit    None    Visit Diagnoses    Encounter for examination following treatment at hospital    -  Primary   Relevant Orders  EKG 12-Lead (Completed)      1. Encounter for examination following treatment at hospital  - EKG 12-Lead-repeat per pts request-pt eval on Friday-pt has contacted cardiology appt for further f/u. pts did not have palpitations when ECG completed  2. Palpitations TSH normal - Hemoglobin G5X - Basic Metabolic Panel Cardio following 3. Hypokalemia bmp 4. Elevated glucose A1c  Anxiety-d/w pt risk/benefit/side effects of zoloft and celexa-pt became upset when risk of worsening depression  discussed, sexual side effects . Pt will call if she decides to start lowest dose of either.   LISA Hannah Beat, MD

## 2019-04-25 ENCOUNTER — Telehealth: Payer: Self-pay | Admitting: Family Medicine

## 2019-04-25 ENCOUNTER — Telehealth (HOSPITAL_COMMUNITY): Payer: Self-pay | Admitting: Emergency Medicine

## 2019-04-25 ENCOUNTER — Telehealth: Payer: Self-pay | Admitting: Cardiology

## 2019-04-25 ENCOUNTER — Other Ambulatory Visit: Payer: Self-pay

## 2019-04-25 DIAGNOSIS — E876 Hypokalemia: Secondary | ICD-10-CM | POA: Insufficient documentation

## 2019-04-25 DIAGNOSIS — R7309 Other abnormal glucose: Secondary | ICD-10-CM | POA: Insufficient documentation

## 2019-04-25 DIAGNOSIS — Z09 Encounter for follow-up examination after completed treatment for conditions other than malignant neoplasm: Secondary | ICD-10-CM | POA: Insufficient documentation

## 2019-04-25 LAB — BASIC METABOLIC PANEL
BUN/Creatinine Ratio: 12 (ref 9–23)
BUN: 12 mg/dL (ref 6–24)
CO2: 20 mmol/L (ref 20–29)
Calcium: 8.8 mg/dL (ref 8.7–10.2)
Chloride: 105 mmol/L (ref 96–106)
Creatinine, Ser: 1.01 mg/dL — ABNORMAL HIGH (ref 0.57–1.00)
GFR calc Af Amer: 79 mL/min/{1.73_m2} (ref >59)
GFR calc non Af Amer: 68 mL/min/{1.73_m2} (ref >59)
Glucose: 82 mg/dL (ref 65–99)
Potassium: 4 mmol/L (ref 3.5–5.2)
Sodium: 140 mmol/L (ref 134–144)

## 2019-04-25 LAB — HEMOGLOBIN A1C
Est. average glucose Bld gHb Est-mCnc: 114 mg/dL
Hgb A1c MFr Bld: 5.6 % (ref 4.8–5.6)

## 2019-04-25 MED ORDER — BLOOD GLUCOSE METER KIT: PACK | 0 refills | Status: AC

## 2019-04-25 NOTE — Telephone Encounter (Signed)
Rx sent to pharmacy   

## 2019-04-25 NOTE — Telephone Encounter (Signed)
Medication Refill - Medication: one touch verio glucose monitor    Preferred Pharmacy (with phone number or street name):   [place], [place] [phone] (Phone) [phone] (Fax)     Agent: Please be advised that RX refills may take up to 3 business days. We ask that you follow-up with your pharmacy.

## 2019-04-25 NOTE — Telephone Encounter (Signed)
  Patient states that Brooke Turner had patient change the was she takes the atenolol (TENORMIN) 25 MG tablet and now her blood pressure is running low. 111/69, 110/59 p57, 96/65 p 60 were most recent readings. She says she feels sluggish and at times could almost fall asleep and some lightheadedness, slight chest pain off an on since last night. Last chest pain was about an hour ago.

## 2019-04-25 NOTE — Telephone Encounter (Signed)
Left message on voicemail with name and callback number Ozell Juhasz RN Navigator Cardiac Imaging [facility] [phone] Office [phone] Cell  

## 2019-04-25 NOTE — Telephone Encounter (Signed)
Returned call to patient she stated since Tenormin 25 mg was changed to 1/4 tablet twice a day she feels worse,tired, no energy.B/P 111/69,110/59,98/64.Stated B/P has been in 90's several times.Stated she has been having slight chest pain off and on was given Simethicone which helps some.No chest pain at present. Stated she has appointment with Kerin Ransom PA 6/23 at 3:45 pm.Advised I will send message to our pharmacist for advice.

## 2019-04-25 NOTE — Telephone Encounter (Signed)
Returned call to patient Brooke Turner's advice given.Stated she will take Atenolol 25 mg 1/4 tablet in morning.Advised to monitor B/P and bring B/P readings to appointment with Kerin Ransom PA 04/30/19 at 3:45 pm.

## 2019-04-25 NOTE — Telephone Encounter (Signed)
We wouldn't expect her to have BP drops due to that low of a dose of atenolol.  Have her just take 1/4 tab in the evenings until her appointment with Lurena Joiner next week

## 2019-04-25 NOTE — Telephone Encounter (Signed)
Pt returned phone call regarding upcoming cardiac imaging study; pt verbalizes understanding of appt date/time, parking situation and where to check in, pre-test NPO status and medications ordered, and verified current allergies; name and call back number provided for further questions should they arise Marchia Bond RN Navigator Cardiac Imaging [facility] [phone] office [phone] cell  Pt having anxiety about test tomorrow, convinced her heart rate will not reach goal of 55-65bpm; allergy to diltiazem and intolerance to metoprolol (takes atenolol daily) Pt suggested that she would take antianxiety medication prior to appt  Denies covid

## 2019-04-26 ENCOUNTER — Ambulatory Visit (HOSPITAL_COMMUNITY): Payer: Managed Care, Other (non HMO)

## 2019-04-26 ENCOUNTER — Ambulatory Visit (HOSPITAL_COMMUNITY): Admission: RE | Admit: 2019-04-26 | Payer: Managed Care, Other (non HMO) | Source: Ambulatory Visit

## 2019-04-26 ENCOUNTER — Ambulatory Visit (HOSPITAL_COMMUNITY)
Admission: RE | Admit: 2019-04-26 | Discharge: 2019-04-26 | Disposition: A | Discharge status: Home / Self Care | Payer: Managed Care, Other (non HMO) | Source: Ambulatory Visit | Attending: Internal Medicine | Admitting: Internal Medicine

## 2019-04-26 ENCOUNTER — Ambulatory Visit: Payer: Managed Care, Other (non HMO) | Admitting: Cardiology

## 2019-04-26 ENCOUNTER — Encounter (HOSPITAL_COMMUNITY): Payer: Self-pay

## 2019-04-26 ENCOUNTER — Other Ambulatory Visit: Payer: Self-pay

## 2019-04-26 DIAGNOSIS — R079 Chest pain, unspecified: Secondary | ICD-10-CM

## 2019-04-26 NOTE — Progress Notes (Signed)
Patients heart rate noted to be elevated prior to CT scan. Patient has intolerance to Cardizem and Lopressor. Dr. Monte Fantasia made aware by Chong Sicilian, RN and per Dr. Monte Fantasia, cancel exam and MD will get in touch with patients ordering provider and Alford Highland, RN, CT Navigator.   Explained to patient reasoning on canceling test, heart rate elevated and unable to medicate d/t patients current intolerance to medications. Patient verbalized understanding. Informed patient, someone will be in touch with her about a plan for rescheduling test.   Patient ambulatory out of department with steady gait noted.

## 2019-04-29 ENCOUNTER — Telehealth: Payer: Self-pay | Admitting: Family Medicine

## 2019-04-29 NOTE — Telephone Encounter (Signed)
Copied from [place] [id number]. Topic: General - Inquiry >> Apr 29, 2019  1:00 PM Richardo Priest, NT wrote: Reason for CRM: Patient is calling in stating she has some concerns with her BP, medication concerns, and test results. Call back is [phone].

## 2019-04-30 ENCOUNTER — Ambulatory Visit (INDEPENDENT_AMBULATORY_CARE_PROVIDER_SITE_OTHER): Payer: Managed Care, Other (non HMO) | Admitting: Medical

## 2019-04-30 ENCOUNTER — Encounter: Payer: Self-pay | Admitting: Cardiology

## 2019-04-30 ENCOUNTER — Telehealth: Payer: Managed Care, Other (non HMO) | Admitting: Cardiology

## 2019-04-30 ENCOUNTER — Other Ambulatory Visit: Payer: Self-pay

## 2019-04-30 VITALS — BP 168/92 | HR 88 | Temp 97.8°F | Ht 64.0 in | Wt 158.0 lb

## 2019-04-30 DIAGNOSIS — R079 Chest pain, unspecified: Secondary | ICD-10-CM

## 2019-04-30 DIAGNOSIS — I10 Essential (primary) hypertension: Secondary | ICD-10-CM | POA: Diagnosis not present

## 2019-04-30 DIAGNOSIS — F418 Other specified anxiety disorders: Secondary | ICD-10-CM

## 2019-04-30 DIAGNOSIS — R002 Palpitations: Secondary | ICD-10-CM | POA: Diagnosis not present

## 2019-04-30 MED ORDER — METOPROLOL TARTRATE 50 MG PO TABS: 50.0000 mg | ORAL_TABLET | Freq: Two times a day (BID) | ORAL | 0 refills | Status: DC

## 2019-04-30 NOTE — Telephone Encounter (Signed)
Unable to leave a message. Called and there was no voicemail.

## 2019-04-30 NOTE — Progress Notes (Signed)
Cardiology Office Note   Date:  04/30/2019   ID:  DEZIREA MCCOLLISTER, DOB [birth year], MRN [id number]  PCP:  Forrest Moron, MD  Cardiologist:  Minus Breeding, MD EP: Virl Axe, MD  Chief Complaint  Patient presents with  . Follow-up    palpitations and HTN      History of Present Illness: Brooke E Felice-Jenningsis a 43YJ femalewith PMH of palpitations, chronic atypical chest pain, anxiety,pre-DM type 2, HTN, GERD, uterine fibroids, who presents for follow-up of her palpitations and HTN.  She has been seen several times this month for ongoing issues with palpitations and HTN, as well as several ED visits for atypical chest pain. She has undergone several trials of BBlockers, including low dose metoprolol, once daily atenolol, and twice daily atenolol without significant improvement in her symptoms. She was last seen by myself 04/19/2019 at which time she was placed on atenolol 1/4 tab BID for palpitations. Her BP was elevated at that visit and it was discovered that her home BP monitor reported ~61mHg less than the office monitor. Given sensitivities to medications no additional BP medications were prescribed at that time. She contacted our office 04/25/2019 with complaints of low blood pressures and was recommended to decrease atenolol to 1/4 tab daily. She was scheduled for a coronary CTA 04/26/2019, however did not go to the appointment due to concern that her HR would not be low enough.  She returns today for follow-up of her palpitations and HTN. She reported improvement in the fluttering sensation in her chest which she thinks was 2/2 gas. Overall her palpitations are improved as well. She reports having some fatigue. She has been monitoring her blood pressures closely with SBP typically in the 100s, occasionally in the 90s. No significant dizziness or lightheadedness; no syncope. BP is elevated today which she attributes to white coat syndrome. No significant changes in  occasional SOB and LE edema. She asks about limitations with activity and was encouraged to build up her exercise tolerance to 30 minutes of physical activity per day.     Past Medical History:  Diagnosis Date  . Anxiety   . Back pain   . Chest pain    a. 09/2014 Echo: EF 60-65%, no rwma, nl valves;  b. 12/2014 Neg ETT.  . Essential hypertension    a. 01/2016 Renal duplex: no RAS- ? renal cyst (renal u/s 4/17 - no cyst, nl study); b. 02/2016 24 hr BP Monitor: Mean SBP ~ 138 with Max of 198. Highest pressures noted in evening hours following placement of cuff.  . Fibroid uterus   . GERD (gastroesophageal reflux disease)   . Palpitations    a. 09/2014 nl event monitor.    Past Surgical History:  Procedure Laterality Date  . WISDOM TOOTH EXTRACTION       Current Outpatient Medications  Medication Sig Dispense Refill  . acetaminophen (TYLENOL) 500 MG tablet Take 500 mg by mouth every 6 (six) hours as needed (back pain/ cramps).     .Marland Kitchenalbuterol (PROVENTIL HFA;VENTOLIN HFA) 108 (90 Base) MCG/ACT inhaler Inhale 1-2 puffs into the lungs every 6 (six) hours as needed for wheezing or shortness of breath. 1 Inhaler 0  . ALPRAZolam (NIRAVAM) 0.25 MG dissolvable tablet Take 0.25 mg by mouth at bedtime as needed for anxiety.    .Marland Kitchenatenolol (TENORMIN) 25 MG tablet Take 1/4 tablet every morning 180 tablet 3  . blood glucose meter kit and supplies Test blood sugar twice daily. Dx code:  R73.09 1 each 0  . Cyanocobalamin (VITAMIN B-12 PO) Take 1 tablet by mouth daily.    . famotidine (PEPCID) 20 MG tablet Take 1 tablet (20 mg total) by mouth 2 (two) times daily. 180 tablet 0  . fluticasone (FLONASE) 50 MCG/ACT nasal spray Place 2 sprays into both nostrils daily. (Patient taking differently: Place 2 sprays into both nostrils daily as needed (seasonal allergies). ) 16 g 0  . glucose blood (ONETOUCH VERIO) test strip TEST BLOOD SUGAR DAILY 25 each 10  . LORazepam (ATIVAN) 0.5 MG tablet Take 1 tablet (0.5 mg  total) by mouth every 6 (six) hours as needed for anxiety. (Patient taking differently: Take 0.25 mg by mouth 3 (three) times daily as needed for anxiety. ) 15 tablet 0  . Multiple Vitamins-Minerals (MULTIVITAMIN GUMMIES ADULT PO) Take 2 tablets by mouth daily.     . Norethindrone-Ethinyl Estradiol-Fe Biphas (LO LOESTRIN FE) 1 MG-10 MCG / 10 MCG tablet Take 1 tablet by mouth at bedtime.     Brooke Turner DELICA LANCETS 19J MISC USE TO TEST BLOOD SUGAR DAILY 100 each 3  . pantoprazole (PROTONIX) 40 MG tablet Take 1 tablet (40 mg total) by mouth daily before breakfast. 90 tablet 0  . potassium chloride SA (K-DUR) 20 MEQ tablet Take 1 tablet (20 mEq total) by mouth daily. 15 tablet 0  . metoprolol tartrate (LOPRESSOR) 50 MG tablet Take 1 tablet (50 mg total) by mouth 2 (two) times daily. Take tablet 1-2 hours prior to Coronary CT 1 tablet 0  . simethicone (MYLICON) 478 MG chewable tablet Chew 1 tablet (125 mg total) by mouth every 6 (six) hours as needed for flatulence. (Patient not taking: Reported on 04/30/2019) 30 tablet 0   Current Facility-Administered Medications  Medication Dose Route Frequency Provider Last Rate Last Dose  . cyanocobalamin ((VITAMIN B-12)) injection 1,000 mcg  1,000 mcg Intramuscular Q30 days [name], MD   1,000 mcg at 10/19/18 1837    Allergies:   Penicillins, Tamiflu [oseltamivir phosphate], Tinidazole, and Diltiazem    Social History:  The patient  reports that she quit smoking about 4 years ago. Her smoking use included cigarettes. She started smoking about 18 years ago. She has a 2.50 pack-year smoking history. She has never used smokeless tobacco. She reports that she does not drink alcohol or use drugs.   Family History:  The patient's family history includes Alzheimer's disease in her maternal grandmother; Cancer in her father; Diabetes (age of onset: 72) in her father; Epilepsy in her mother; Heart disease in her maternal grandfather; Heart disease (age of onset: 38)  in her father; Hypertension in her father; Seizures in her mother.    ROS:  Please see the history of present illness.   Otherwise, review of systems are positive for none.   All other systems are reviewed and negative.    PHYSICAL EXAM: VS:  BP (!) 168/92   Pulse 88   Temp 97.8 F (36.6 C)   Ht 5' 4"  (1.626 m)   Wt 158 lb (71.7 kg)   LMP  (LMP Unknown) Comment: continuous bc   SpO2 99%   BMI 27.12 kg/m  , BMI Body mass index is 27.12 kg/m. GEN: Well nourished, well developed, in no acute distress HEENT: sclera anicteric Neck: no JVD, carotid bruits, or masses Cardiac: RRR; no murmurs, rubs, or gallops, no edema  Respiratory:  clear to auscultation bilaterally, normal work of breathing GI: soft, nontender, nondistended, + BS MS: no deformity  or atrophy Skin: warm and dry, no rash Neuro:  Strength and sensation are intact Psych: anxious, full affect   EKG:  EKG is not ordered today.    Recent Labs: 10/08/2018: B Natriuretic Peptide 36.0 10/18/2018: Magnesium 2.1 02/28/2019: ALT 13 04/19/2019: TSH 0.600 04/21/2019: Hemoglobin 13.3; Platelets 251 04/24/2019: BUN 12; Creatinine, Ser 1.01; Potassium 4.0; Sodium 140    Lipid Panel    Component Value Date/Time   CHOL 172 10/25/2016 1850   TRIG 87 10/25/2016 1850   HDL 49 10/25/2016 1850   CHOLHDL 3.5 10/25/2016 1850   LDLCALC 106 (H) 10/25/2016 1850   LDLDIRECT 101 (H) 10/25/2016 1850      Wt Readings from Last 3 Encounters:  04/30/19 158 lb (71.7 kg)  04/24/19 159 lb 3.2 oz (72.2 kg)  04/19/19 158 lb 6.4 oz (71.8 kg)      Other studies Reviewed: Additional studies/ records that were reviewed today include:  Echocardiogram 04/10/2019: IMPRESSIONS   1. The left ventricle has normal systolic function with an ejection fraction of 60-65%. The cavity size was normal. Left ventricular diastolic Doppler parameters are consistent with impaired relaxation. No evidence of left ventricular regional wall  motion  abnormalities. 2. The right ventricle has normal systolic function. The cavity was normal. There is no increase in right ventricular wall thickness. 3. No evidence of mitral valve stenosis. No significant regurgitation. 4. The aortic valve is tricuspid. No stenosis of the aortic valve. 5. The aortic root is normal in size and structure. 6. Normal IVC size. No complete TR doppler jet so unable to estimate PA systolic pressure.  Cardiac event monitor 03/26/2019: Normal sinus rhythm Rare ectopy No significant arrhythmias Complaints including chest pain and fluttering did not correlate with arrhythmia Symptoms occurred with NSR    ASSESSMENT AND PLAN:  1. Palpitations: overall improved.  - Continue atenolol 6.72m BID for now. We discussed liberty to make changes at home within the 12.519mmax daily dosing to find a regimen that works best for her. She was instructed to notify the office if she makes a change to her regimen at home.   2. Atypical chest pain: She presented for her coronary CTA appt 04/26/2019, however was unable to get her HR within range. She did not take the 1x dose of metoprolol prior to her appointment. We discussed the utility of metoprolol prior to testing. - Message sent to ShMack Guiseo assist with rescheduling - Rx sent for 1x dose of metoprolol 5074mo take 1hr prior to testing. Patient instructed to take her anxiety medicine prior to her visit as well.  3. HTN: BP elevated again today but home BP log revealed SBP primarily in the 90s-100s. She picked up a new BP cuff and verified accuracy with our machine at today's visit. No significant lightheadedness, dizziness, or syncope with current BP's.  - Continue atenolol 6.22m50mD    4. Anxiety: Still suspect this is contributing to #1. PCP has been discussing starting an SSRI  - Continue management per PCP.    Current medicines are reviewed at length with the patient today.  The patient does not have  concerns regarding medicines.  The following changes have been made:  no change  Labs/ tests ordered today include: Reschedule coronary CTA No orders of the defined types were placed in this encounter.    Disposition:   FU with Dr. HochPercival Spanish2 months  Signed, KrisAbigail Butts-C  04/30/2019 5:12 PM

## 2019-04-30 NOTE — Patient Instructions (Addendum)
Medication Instructions:   Your physician recommends that you continue on your current medications as directed. Please refer to the Current Medication list given to you today.  If you need a refill on your cardiac medications before your next appointment, please call your pharmacy.   Lab work:  NONE ordered at this time of appointment   If you have labs (blood work) drawn today and your tests are completely normal, you will receive your results only by: Marland Kitchen MyChart Message (if you have MyChart) OR . A paper copy in the mail If you have any lab test that is abnormal or we need to change your treatment, we will call you to review the results.  Testing/Procedures:  NONE ordered at this time of appointment   Follow-Up: At [facility], you and your health needs are our priority.  As part of our continuing mission to provide you with exceptional heart care, we have created designated Provider Care Teams.  These Care Teams include your primary Cardiologist (physician) and Advanced Practice Providers (APPs -  Physician Assistants and Nurse Practitioners) who all work together to provide you with the care you need, when you need it. . You will need a follow up appointment in 2 months with Minus Breeding, MD   Any Other Special Instructions Will Be Listed Below (If Applicable).  Take the Metoprolol Tartrate 50 mg 1 hour prior to the Coronary CT

## 2019-04-30 NOTE — Progress Notes (Signed)
ooooops  Thanks  Can u use IV metop or inderal

## 2019-05-01 ENCOUNTER — Telehealth: Payer: Self-pay | Admitting: Medical

## 2019-05-01 NOTE — Telephone Encounter (Signed)
S/w Roby Lofts she is to take metoprolol 50mg  x1 only-before CT. She is to take amlodipine 1 daily-prn  Pharmacist notified

## 2019-05-01 NOTE — Telephone Encounter (Signed)
[place] is calling to verify the quanties on Metoprolol"50 mg

## 2019-05-07 ENCOUNTER — Telehealth: Payer: Managed Care, Other (non HMO) | Admitting: Cardiology

## 2019-05-09 ENCOUNTER — Other Ambulatory Visit: Payer: Self-pay | Admitting: Internal Medicine

## 2019-05-13 ENCOUNTER — Other Ambulatory Visit (HOSPITAL_COMMUNITY)
Admission: RE | Admit: 2019-05-13 | Discharge: 2019-05-13 | Disposition: A | Discharge status: Home / Self Care | Payer: Managed Care, Other (non HMO) | Source: Ambulatory Visit | Attending: Internal Medicine | Admitting: Internal Medicine

## 2019-05-13 DIAGNOSIS — Z01812 Encounter for preprocedural laboratory examination: Secondary | ICD-10-CM | POA: Insufficient documentation

## 2019-05-13 DIAGNOSIS — Z1159 Encounter for screening for other viral diseases: Secondary | ICD-10-CM | POA: Insufficient documentation

## 2019-05-14 LAB — SARS CORONAVIRUS 2 (TAT 6-24 HRS): SARS Coronavirus 2: NEGATIVE

## 2019-05-16 ENCOUNTER — Ambulatory Visit (INDEPENDENT_AMBULATORY_CARE_PROVIDER_SITE_OTHER): Payer: Managed Care, Other (non HMO) | Admitting: Internal Medicine

## 2019-05-16 ENCOUNTER — Other Ambulatory Visit: Payer: Self-pay | Admitting: Internal Medicine

## 2019-05-16 ENCOUNTER — Other Ambulatory Visit: Payer: Self-pay

## 2019-05-16 DIAGNOSIS — R0600 Dyspnea, unspecified: Secondary | ICD-10-CM | POA: Diagnosis not present

## 2019-05-16 LAB — PULMONARY FUNCTION TEST
DL/VA % pred: 144 %
DL/VA: 6.4 ml/min/mmHg/L
DLCO cor % pred: 139 %
DLCO cor: 29.1 ml/min/mmHg
DLCO unc % pred: 138 %
DLCO unc: 29.01 ml/min/mmHg
FEF 25-75 Post: 3.99 L/sec
FEF 25-75 Pre: 3.36 L/sec
FEF2575-%Change-Post: 18 %
FEF2575-%Pred-Post: 150 %
FEF2575-%Pred-Pre: 127 %
FEV1-%Change-Post: 4 %
FEV1-%Pred-Post: 132 %
FEV1-%Pred-Pre: 126 %
FEV1-Post: 3.16 L
FEV1-Pre: 3.03 L
FEV1FVC-%Change-Post: 3 %
FEV1FVC-%Pred-Pre: 102 %
FEV6-%Change-Post: 0 %
FEV6-%Pred-Post: 126 %
FEV6-%Pred-Pre: 125 %
FEV6-Post: 3.6 L
FEV6-Pre: 3.57 L
FEV6FVC-%Change-Post: 0 %
FEV6FVC-%Pred-Post: 102 %
FEV6FVC-%Pred-Pre: 102 %
FVC-%Change-Post: 0 %
FVC-%Pred-Post: 123 %
FVC-%Pred-Pre: 122 %
FVC-Post: 3.6 L
FVC-Pre: 3.58 L
Post FEV1/FVC ratio: 88 %
Post FEV6/FVC ratio: 100 %
Pre FEV1/FVC ratio: 85 %
Pre FEV6/FVC Ratio: 100 %
RV % pred: 114 %
RV: 1.83 L
TLC % pred: 105 %
TLC: 5.19 L

## 2019-05-16 NOTE — Progress Notes (Signed)
Error. I attempted to cancel an order for an ABG for this patient per CY, however, was made aware that the order was addended to be a regular pulmonary function test performed in the office today 05/16/2019. Nothing further needed at this time.

## 2019-05-16 NOTE — Progress Notes (Signed)
Full PFT performed today. °

## 2019-05-20 ENCOUNTER — Encounter: Payer: Managed Care, Other (non HMO) | Admitting: Family Medicine

## 2019-05-22 ENCOUNTER — Other Ambulatory Visit: Payer: Self-pay

## 2019-05-22 ENCOUNTER — Emergency Department (HOSPITAL_COMMUNITY)
Admission: EM | Admit: 2019-05-22 | Discharge: 2019-05-23 | Disposition: A | Discharge status: Home / Self Care | Payer: Managed Care, Other (non HMO) | Attending: Emergency Medicine | Admitting: Emergency Medicine

## 2019-05-22 ENCOUNTER — Emergency Department (HOSPITAL_COMMUNITY): Payer: Managed Care, Other (non HMO)

## 2019-05-22 ENCOUNTER — Encounter (HOSPITAL_COMMUNITY): Payer: Self-pay

## 2019-05-22 DIAGNOSIS — R079 Chest pain, unspecified: Secondary | ICD-10-CM | POA: Diagnosis not present

## 2019-05-22 DIAGNOSIS — Z87891 Personal history of nicotine dependence: Secondary | ICD-10-CM | POA: Diagnosis not present

## 2019-05-22 DIAGNOSIS — I10 Essential (primary) hypertension: Secondary | ICD-10-CM | POA: Diagnosis not present

## 2019-05-22 DIAGNOSIS — Z79899 Other long term (current) drug therapy: Secondary | ICD-10-CM | POA: Diagnosis not present

## 2019-05-22 DIAGNOSIS — K30 Functional dyspepsia: Secondary | ICD-10-CM | POA: Insufficient documentation

## 2019-05-22 LAB — BASIC METABOLIC PANEL
Anion gap: 14 (ref 5–15)
BUN: 9 mg/dL (ref 6–20)
CO2: 20 mmol/L — ABNORMAL LOW (ref 22–32)
Calcium: 8.8 mg/dL — ABNORMAL LOW (ref 8.9–10.3)
Chloride: 104 mmol/L (ref 98–111)
Creatinine, Ser: 1.19 mg/dL — ABNORMAL HIGH (ref 0.44–1.00)
GFR calc Af Amer: >60 mL/min (ref >60)
GFR calc non Af Amer: 56 mL/min — ABNORMAL LOW (ref >60)
Glucose, Bld: 98 mg/dL (ref 70–99)
Potassium: 3.4 mmol/L — ABNORMAL LOW (ref 3.5–5.1)
Sodium: 138 mmol/L (ref 135–145)

## 2019-05-22 LAB — CBC
HCT: 39.9 % (ref 36.0–46.0)
Hemoglobin: 14 g/dL (ref 12.0–15.0)
MCH: 31.3 pg (ref 26.0–34.0)
MCHC: 35.1 g/dL (ref 30.0–36.0)
MCV: 89.3 fL (ref 80.0–100.0)
Platelets: 257 10*3/uL (ref 150–400)
RBC: 4.47 MIL/uL (ref 3.87–5.11)
RDW: 12.1 % (ref 11.5–15.5)
WBC: 7.4 10*3/uL (ref 4.0–10.5)
nRBC: 0 % (ref 0.0–0.2)

## 2019-05-22 LAB — I-STAT BETA HCG BLOOD, ED (MC, WL, AP ONLY): I-stat hCG, quantitative: <5 m[IU]/mL (ref <5)

## 2019-05-22 LAB — TROPONIN I (HIGH SENSITIVITY): Troponin I (High Sensitivity): <2 ng/L (ref <18)

## 2019-05-22 MED ORDER — SODIUM CHLORIDE 0.9% FLUSH: 3.0000 mL | Freq: Once | INTRAVENOUS | Status: DC

## 2019-05-22 NOTE — ED Triage Notes (Signed)
Pt states that for the past three days she has been having CP, a burning in her chest and pain in her L calf. Pt took ativan without relief, some SOB, and nausea and radiation to back

## 2019-05-23 ENCOUNTER — Ambulatory Visit (INDEPENDENT_AMBULATORY_CARE_PROVIDER_SITE_OTHER): Payer: Managed Care, Other (non HMO) | Admitting: Internal Medicine

## 2019-05-23 ENCOUNTER — Other Ambulatory Visit: Payer: Self-pay

## 2019-05-23 ENCOUNTER — Encounter: Payer: Self-pay | Admitting: Internal Medicine

## 2019-05-23 VITALS — BP 116/80 | HR 76 | Temp 98.6°F | Ht 64.0 in | Wt 159.0 lb

## 2019-05-23 DIAGNOSIS — R0683 Snoring: Secondary | ICD-10-CM | POA: Diagnosis not present

## 2019-05-23 DIAGNOSIS — F418 Other specified anxiety disorders: Secondary | ICD-10-CM | POA: Diagnosis not present

## 2019-05-23 DIAGNOSIS — G4733 Obstructive sleep apnea (adult) (pediatric): Secondary | ICD-10-CM

## 2019-05-23 LAB — TROPONIN I (HIGH SENSITIVITY): Troponin I (High Sensitivity): 3 ng/L (ref <18)

## 2019-05-23 MED ORDER — LEVALBUTEROL TARTRATE 45 MCG/ACT IN AERO: INHALATION_SPRAY | RESPIRATORY_TRACT | 12 refills | Status: DC

## 2019-05-23 NOTE — ED Provider Notes (Signed)
[facility] EMERGENCY DEPARTMENT Provider Note   CSN: [id number] Arrival date & time: 05/22/19  1925     History   Chief Complaint Chief Complaint  Patient presents with  . Chest Pain    HPI Brooke Turner is a 43 y.o. female.     43 year old female presents to the emergency department for complaints of chest discomfort.  She describes a burning sensation in her left upper chest x3 days.  Symptoms are intermittent and last a few seconds before resolving.  She feels as though she has increased indigestion and the need to burp prior to onset of this burning pain.  Denies taking any medications for symptoms.  Is unsure of any aggravating or alleviating features.  Has been compliant with her daily medications.  Denies fevers, syncope or near syncope, nausea, vomiting, leg swelling.  She has been seen numerous times in the emergency department for various chest pain complaints.  Negative CTA x2 in the past 7 months.  Was supposed to undergo a coronary CT recently, but later declined this imaging modality.  The history is provided by the patient. No language interpreter was used.  Chest Pain   Past Medical History:  Diagnosis Date  . Anxiety   . Back pain   . Chest pain    a. 09/2014 Echo: EF 60-65%, no rwma, nl valves;  b. 12/2014 Neg ETT.  . Essential hypertension    a. 01/2016 Renal duplex: no RAS- ? renal cyst (renal u/s 4/17 - no cyst, nl study); b. 02/2016 24 hr BP Monitor: Mean SBP ~ 138 with Max of 198. Highest pressures noted in evening hours following placement of cuff.  . Fibroid uterus   . GERD (gastroesophageal reflux disease)   . Palpitations    a. 09/2014 nl event monitor.    Patient Active Problem List   Diagnosis Date Noted  . Encounter for examination following treatment at hospital 04/25/2019  . Hypokalemia 04/25/2019  . Elevated glucose 04/25/2019  . History of posttraumatic stress disorder (PTSD) 12/31/2018  . History of panic  attacks 12/31/2018  . Palpitations   . Atypical chest pain   . Chronic tension-type headache, not intractable 06/10/2015  . Dyspnea 03/18/2015  . Post-traumatic stress reaction 02/25/2015  . Anxiety disorder due to general medical condition with panic attack 02/25/2015  . Subserous leiomyoma of uterus 02/25/2015  . Anxiety about health 02/25/2015  . Deviated nasal septum 12/30/2014  . Chronic rhinitis 12/30/2014  . Chronic maxillary sinusitis 12/30/2014  . Snorings 09/30/2014  . Prediabetes 08/26/2014  . Acanthosis nigricans 03/19/2014  . Vitamin D deficiency 07/15/2013  . Large breasts 09/12/2012  . Essential hypertension 02/17/2012  . Obesity (BMI 30-39.9) previous BMI was over 40. Patient has lost 70 pounds in the last year 02/17/2012  . Anxiety disorder 02/17/2012  . Chronic back pain 02/17/2012    Past Surgical History:  Procedure Laterality Date  . WISDOM TOOTH EXTRACTION       OB History    Gravida  2   Para  0   Term  0   Preterm  0   AB  2   Living  0     SAB  1   TAB  1   Ectopic  0   Multiple  0   Live Births               Home Medications    Prior to Admission medications   Medication Sig Start Date End  Date Taking? Authorizing Provider  acetaminophen (TYLENOL) 500 MG tablet Take 500 mg by mouth every 6 (six) hours as needed (back pain/ cramps).     [provider]  albuterol (PROVENTIL HFA;VENTOLIN HFA) 108 (90 Base) MCG/ACT inhaler Inhale 1-2 puffs into the lungs every 6 (six) hours as needed for wheezing or shortness of breath. 02/07/19   Alveria Apley, PA-C  ALPRAZolam (NIRAVAM) 0.25 MG dissolvable tablet Take 0.25 mg by mouth at bedtime as needed for anxiety.    [provider]  atenolol (TENORMIN) 25 MG tablet Take 1/4 tablet every morning 04/25/19   Alvstad, Kristin L, RPH-CPP  blood glucose meter kit and supplies Test blood sugar twice daily. Dx code: R73.09 04/25/19   Maryruth Hancock, MD  Cyanocobalamin (VITAMIN  B-12 PO) Take 1 tablet by mouth daily.    [provider]  famotidine (PEPCID) 20 MG tablet Take 1 tablet (20 mg total) by mouth 2 (two) times daily. 11/20/18   [name], MD  fluticasone (FLONASE) 50 MCG/ACT nasal spray Place 2 sprays into both nostrils daily. Patient taking differently: Place 2 sprays into both nostrils daily as needed (seasonal allergies).  10/10/17   Tenna Delaine D, PA-C  glucose blood (ONETOUCH VERIO) test strip TEST BLOOD SUGAR DAILY 11/16/18   [name], MD  LORazepam (ATIVAN) 0.5 MG tablet Take 1 tablet (0.5 mg total) by mouth every 6 (six) hours as needed for anxiety. Patient taking differently: Take 0.25 mg by mouth 3 (three) times daily as needed for anxiety.  03/19/19   Forrest Moron, MD  metoprolol tartrate (LOPRESSOR) 50 MG tablet Take 1 tablet (50 mg total) by mouth 2 (two) times daily. Take tablet 1-2 hours prior to Coronary CT 04/30/19   Kroeger, Lorelee Cover., PA-C  Multiple Vitamins-Minerals (MULTIVITAMIN GUMMIES ADULT PO) Take 2 tablets by mouth daily.     [provider]  Norethindrone-Ethinyl Estradiol-Fe Biphas (LO LOESTRIN FE) 1 MG-10 MCG / 10 MCG tablet Take 1 tablet by mouth at bedtime.     [provider]  Jonetta Speak LANCETS 02O MISC USE TO TEST BLOOD SUGAR DAILY 11/16/18   [name], MD  pantoprazole (PROTONIX) 40 MG tablet Take 1 tablet (40 mg total) by mouth daily before breakfast. 03/13/19   Thornton Park, MD  potassium chloride SA (K-DUR) 20 MEQ tablet Take 1 tablet (20 mEq total) by mouth daily. 04/18/19   Lajean Saver, MD  simethicone (MYLICON) 378 MG chewable tablet Chew 1 tablet (125 mg total) by mouth every 6 (six) hours as needed for flatulence. Patient not taking: Reported on 04/30/2019 04/21/19   Charlesetta Shanks, MD    Family History Family History  Problem Relation Age of Onset  . Seizures Mother   . Epilepsy Mother   . Cancer Father        Liver  . Diabetes Father 36  . Hypertension Father   .  Heart disease Father 37       CEA, LE [place]  . Alzheimer's disease Maternal Grandmother   . Heart disease Maternal Grandfather   . Colon cancer Neg Hx     Social History Social History   Tobacco Use  . Smoking status: Former Smoker    Packs/day: 0.25    Years: 10.00    Pack years: 2.50    Types: Cigarettes    Start date: 06/25/2000    Quit date: 08/19/2014    Years since quitting: 4.7  . Smokeless tobacco: Never Used  Substance Use Topics  . Alcohol use: No    Alcohol/week: 0.0 standard drinks  . Drug use: No     Allergies   Penicillins, Tamiflu [oseltamivir phosphate], Tinidazole, and Diltiazem   Review of Systems Review of Systems  Cardiovascular: Positive for chest pain.  Ten systems reviewed and are negative for acute change, except as noted in the HPI.    Physical Exam Updated Vital Signs BP 124/71   Pulse (!) 59   Temp 98.3 F (36.8 C)   Resp 17   SpO2 98%   Physical Exam Vitals signs and nursing note reviewed.  Constitutional:      General: She is not in acute distress.    Appearance: She is well-developed. She is not diaphoretic.     Comments: Nontoxic appearing and in NAD  HENT:     Head: Normocephalic and atraumatic.  Eyes:     General: No scleral icterus.    Conjunctiva/sclera: Conjunctivae normal.  Neck:     Musculoskeletal: Normal range of motion.  Cardiovascular:     Rate and Rhythm: Normal rate and regular rhythm.     Pulses: Normal pulses.     Comments: Not tachycardic as noted in triage Pulmonary:     Effort: Pulmonary effort is normal. No respiratory distress.     Breath sounds: No stridor. No wheezing, rhonchi or rales.     Comments: Respirations even and unlabored.  Lungs clear to auscultation bilaterally. Musculoskeletal: Normal range of motion.  Skin:    General: Skin is warm and dry.     Coloration: Skin is not pale.     Findings: No erythema or rash.  Neurological:     Mental Status: She is alert and oriented to  person, place, and time.     Coordination: Coordination normal.  Psychiatric:        Behavior: Behavior normal.      ED Treatments / Results  Labs (all labs ordered are listed, but only abnormal results are displayed) Labs Reviewed  BASIC METABOLIC PANEL - Abnormal; Notable for the following components:      Result Value   Potassium 3.4 (*)    CO2 20 (*)    Creatinine, Ser 1.19 (*)    Calcium 8.8 (*)    GFR calc non Af Amer 56 (*)    All other components within normal limits  CBC  I-STAT BETA HCG BLOOD, ED ([facility], [facility], [facility] ONLY)  TROPONIN I (HIGH SENSITIVITY)  TROPONIN I (HIGH SENSITIVITY)    EKG EKG Interpretation  Date/Time:  Wednesday May 22 2019 19:33:42 EDT Ventricular Rate:  94 PR Interval:  150 QRS Duration: 86 QT Interval:  346 QTC Calculation: 432 R Axis:   44 Text Interpretation:  Normal sinus rhythm Possible Left atrial enlargement Borderline ECG No significant change since last tracing Confirmed by Ripley Fraise [id number]) on 05/22/2019 11:54:18 PM   Radiology Dg Chest 2 View  Result Date: 05/22/2019 CLINICAL DATA:  Chest pain EXAM: CHEST - 2 VIEW COMPARISON:  04/21/2019 FINDINGS: Heart and mediastinal contours are within normal limits. No focal opacities or effusions. No acute bony abnormality. IMPRESSION: No active cardiopulmonary disease. Electronically Signed   By: Rolm Baptise M.D.   On: 05/22/2019 20:17    Procedures Procedures (including critical care time)  Medications Ordered in ED Medications  sodium chloride flush (NS) 0.9 % injection 3 mL (has no administration in time range)     Initial Impression / Assessment and Plan / ED Course  I  have reviewed the triage vital signs and the nursing notes.  Pertinent labs & imaging results that were available during my care of the patient were reviewed by me and considered in my medical decision making (see chart for details).        Patient presents to the emergency department for evaluation of  fleeting, burning left sided chest pain x 3 days.  EKG is nonischemic and troponin negative x 2.  Symptoms atypical for and unlikely to represent ACS.  Chest x-ray without evidence of mediastinal widening to suggest dissection.  No pneumothorax, pneumonia, pleural effusion.  The patient has been seen in the emergency department multiple times for various chest pain complaints.  Discussed with the patient that her symptoms may be related to reflux as she feels the sensation to burp/belch prior to onset of her burning discomfort.  She does also have a history of anxiety which could also be driving her symptoms.  I do not believe further emergent work-up is indicated and have encouraged her to follow-up with her primary doctor.  Return precautions discussed and provided. Patient discharged in stable condition with no unaddressed concerns.   Final Clinical Impressions(s) / ED Diagnoses   Final diagnoses:  Nonspecific chest pain    ED Discharge Orders    None       Antonietta Breach, PA-C 05/23/19 [id number]    Ripley Fraise, MD 05/23/19 [phone]

## 2019-05-23 NOTE — ED Notes (Signed)
EDP at bedside  

## 2019-05-23 NOTE — Discharge Instructions (Signed)
Your work-up in the emergency department has been reassuring and does not reveal a concerning cause of your symptoms.  Follow-up with your primary care doctor and specialists for further evaluation.

## 2019-05-23 NOTE — Progress Notes (Signed)
HPI 43 year old female former smoker followed for sleep concerns/insomnia NPSG 11/18/14 [facility]/ [facility]- AHI 0.2 per hour with some snoring, mild periodic limb movement NPSG 01/19/16- Normal with snoring, AHI 0.2/ hr, desat to 93%, body weight 166 lbs --------------------------------------------------------------------------------------- 10/25/2018- 43 year old female former smoker followed for sleep concerns/insomnia, complicated by DM 2, HBP, chronic rhinitis/sinusitis, headaches, -----She has had more issues with waking up in the night feeling like her heart is beating out of her chest.She has had increase SOB while talking and with activies. Alprazolam 0.25 mg at at bedtime, lorazepam 0.5 mg every 6 hours if needed for anxiety, Echocardiogram stress test 08/24/2017-negative Methacholine Challenge Test--04/01/15-negative for hyper reactive airways She says she has had several nights when she has awakened with heart pounding, which frightens her.  Feels she gets short of breath more easily recently at the gym with no acute event.  Occasionally hears a little wheeze if she is talking a lot on the phone. Had a fainting episode at work and is now wearing a heart monitor Lorazepam is sufficient for helping her sleep at night.  She does not feel bothered by insomnia.  Concerned that she may have sleep apnea although 2017 study was negative. CXR 10/15/2018- No active cardiopulmonary disease. ?trazodone  05/23/2019- 43 year old female former smoker followed for sleep concerns/insomnia, complicated by DM 2, HBP, chronic rhinitis/sinusitis, headaches, -----PFT follow-up from 05/16/2019 & sleep study results PFT 05/16/2019-WNL HST 12/23/2018 AHI 0.6/ hr, desaturation to 94%, body weight 165 lbs She questions accuracy of HST, claiming she felt she was awake much of the night. Wants in-center study. Wakes 3-4 x/ week SOB, heart pounding, shakes when she wakes up. Often within few hours of sleep onset.   Eventually gets back to sleep. Not prevented by  Lorazepam at HS. She asks change albuterol hfa to levalbuterol for less stimulation. Discussed use for wheezing dyspnea, not just if exertional dyspnea in hot weather.   ROS-see HPI   + = positive Constitutional:     weight loss, night sweats, fevers, chills, fatigue, lassitude. HEENT:    headaches, difficulty swallowing, tooth/dental problems, sore throat,       sneezing, itching, ear ache, nasal congestion, post nasal drip, snoring CV:    chest pain, orthopnea, PND, swelling in lower extremities, anasarca,                                                          dizziness, + palpitations Resp:   +shortness of breath with exertion or at rest.                productive cough,   non-productive cough, coughing up of blood.              change in color of mucus.  wheezing.   Skin:    rash or lesions. GI:  No-   heartburn, indigestion, abdominal pain, nausea, vomiting, diarrhea,                 change in bowel habits, loss of appetite GU: dysuria, change in color of urine, no urgency or frequency.   flank pain. MS:   joint pain, stiffness, decreased range of motion, back pain. Neuro-     nothing unusual Psych:  change in mood or affect.  depression, + anxiety.   memory loss.  OBJ- Physical Exam General- Alert, Oriented, Affect-appropriate, Distress- none acute, + talkative Skin- rash-none, lesions- none, excoriation- none Lymphadenopathy- none Head- atraumatic            Eyes- Gross vision intact, PERRLA, conjunctivae and secretions clear            Ears- Hearing, canals-normal            Nose- Clear, no-Septal dev, mucus, polyps, erosion, perforation             Throat- Mallampati II-III , mucosa clear , drainage- none, tonsils- atrophic Neck- flexible , trachea midline, no stridor , thyroid nl, carotid no bruit Chest - symmetrical excursion , unlabored           Heart/CV- RRR , no murmur , no gallop  , no rub, nl s1 s2                            - JVD- none , edema- none, stasis changes- none, varices- none           Lung- clear to P&A, wheeze- none, cough- none , dullness-none, rub- none           Chest wall-  Abd-  Br/ Gen/ Rectal- Not done, not indicated Extrem- cyanosis- none, clubbing, none, atrophy- none, strength- nl Neuro- grossly intact to observation

## 2019-05-23 NOTE — ED Notes (Signed)
Discharge instructions discussed with pt. Pt verbalized understanding. Pt to go home with family.  

## 2019-05-23 NOTE — Patient Instructions (Addendum)
Order- Schedule Split Study sleep study at [place]     Dx OSA  Script for levalbuterol rescue inhaler sent, to use instead   Please call us for results about 2 weeks after your sleep study is done.

## 2019-05-24 ENCOUNTER — Telehealth: Payer: Self-pay | Admitting: Cardiology

## 2019-05-24 ENCOUNTER — Telehealth: Payer: Self-pay | Admitting: Internal Medicine

## 2019-05-24 NOTE — Assessment & Plan Note (Signed)
She repeatedly questions reliability of tests, questions results and presses for additional eval to see "why do I feel this way".  I have done what I can. Reassured that available and pertinent testing results have been good. I don't have a way to eval for autonomic instability. Consider if  It would make sense for PCP would refer to Endocrine to assess for carcinoid syndrome or etc.

## 2019-05-24 NOTE — Assessment & Plan Note (Signed)
Episodes of dyspnea may be associated with snoring. She thinks home study was unrepresentative and asks for in-center sleep study.  Plan- Split night study

## 2019-05-24 NOTE — Telephone Encounter (Signed)
Per pt call she would like to speak to someone about being fitted for a 24 or 48 hr monitor.  Pt stated she was in the ED 7/15 and this was recommended. Please give pt a call.

## 2019-05-24 NOTE — Telephone Encounter (Signed)
Her SARS test was reported negative, with no antibodies detected for this virus. This is the same test still available through our lab.

## 2019-05-24 NOTE — Telephone Encounter (Signed)
Called & spoke w/ pt. Pt is inquiring on getting another antibody test done (non-COVID). Pt and I verified her last antibody test was 02/28/2019, which was resulted to her. I reinforced to pt from CY's note that there are "no signs of any significant problem", and that the antibody test came back "negative", however, pt still wants to know if there is a lab that will test the numerical range of her antibodies. I let her know I would route a message to CY and get back with her with his recommendations.   CY, please advise with your recommendations for this pt. Thank you.

## 2019-05-24 NOTE — Telephone Encounter (Signed)
LMTCB

## 2019-05-27 NOTE — Telephone Encounter (Signed)
Patient states that she has been experiencing flutters and different symptoms, she states it happens daily, she is also having leg pain (calf pain, having cramps in both) denies redness in the area, according to patient she states her veins are more visible.  Denies chest pains currently other than the flutter feelings that is 'annoying'. Does experience some pain after the flutter feeling.  Patient thinks she should be seen sooner than the appointment with Dr.Hochrein 06/21/2019, and discuss doing a monitor.   Advised I would route to MD and nurse to make aware.

## 2019-05-27 NOTE — Telephone Encounter (Signed)
I would not suggest another monitor she has worn a couple in the past months.  Does she have her own wearable.  She can be added to an APP schedule as needed.  I have no add on slots for this week.  I might have a sooner appt in August.

## 2019-05-27 NOTE — Telephone Encounter (Signed)
Called and spoke with pt letting her know the results of the labwork that was performed. Pt verbalized understanding. Nothing further needed.

## 2019-05-27 NOTE — Telephone Encounter (Signed)
Called patient, advised of message from MD, advised to call back to schedule with available APP.  Left call back number.

## 2019-05-28 ENCOUNTER — Telehealth: Payer: Self-pay | Admitting: Internal Medicine

## 2019-05-28 NOTE — Telephone Encounter (Signed)
I called and spoke with pt today to gather an Epworth Sleepiness Score for her Sleep Study Precertification Request form. Pt completed the score with me over the phone and had an additional question.   Pt states her pharmacy ([facility]) called her stating they have made multiple attempts to reach out to [facility] regarding a PA for her levalbuterol [facility]) medication. Pt is intolerant to albuterol. I let her know I would look into this and get it taken care of for her. PT verbalized understanding.   I am keeping this encounter open to follow-up on. I have not seen a telephone note or incoming fax regarding a PA request. Will check with patient's pharmacy later today.

## 2019-05-29 ENCOUNTER — Encounter: Payer: Self-pay | Admitting: Cardiology

## 2019-05-29 ENCOUNTER — Telehealth: Payer: Self-pay | Admitting: Cardiology

## 2019-05-29 ENCOUNTER — Other Ambulatory Visit: Payer: Self-pay

## 2019-05-29 ENCOUNTER — Telehealth: Payer: Self-pay | Admitting: Internal Medicine

## 2019-05-29 ENCOUNTER — Ambulatory Visit (INDEPENDENT_AMBULATORY_CARE_PROVIDER_SITE_OTHER): Payer: Managed Care, Other (non HMO) | Admitting: Cardiology

## 2019-05-29 VITALS — BP 168/85 | HR 101 | Temp 95.4°F | Wt 158.0 lb

## 2019-05-29 DIAGNOSIS — R079 Chest pain, unspecified: Secondary | ICD-10-CM

## 2019-05-29 DIAGNOSIS — R002 Palpitations: Secondary | ICD-10-CM | POA: Diagnosis not present

## 2019-05-29 DIAGNOSIS — F418 Other specified anxiety disorders: Secondary | ICD-10-CM | POA: Diagnosis not present

## 2019-05-29 DIAGNOSIS — R0789 Other chest pain: Secondary | ICD-10-CM

## 2019-05-29 MED ORDER — LEVALBUTEROL TARTRATE 45 MCG/ACT IN AERO: INHALATION_SPRAY | RESPIRATORY_TRACT | 12 refills | Status: DC

## 2019-05-29 NOTE — Patient Instructions (Addendum)
Medication Instructions:  Your physician recommends that you continue on your current medications as directed. Please refer to the Current Medication list given to you today. If you need a refill on your cardiac medications before your next appointment, please call your pharmacy.   Lab work: NONE  If you have labs (blood work) drawn today and your tests are completely normal, you will receive your results only by: Marland Kitchen MyChart Message (if you have MyChart) OR . A paper copy in the mail If you have any lab test that is abnormal or we need to change your treatment, we will call you to review the results.  Testing/Procedures: NONE  Follow-Up: At [facility], you and your health needs are our priority.  As part of our continuing mission to provide you with exceptional heart care, we have created designated Provider Care Teams.  These Care Teams include your primary Cardiologist (physician) and Advanced Practice Providers (APPs -  Physician Assistants and Nurse Practitioners) who all work together to provide you with the care you need, when you need it. . FOLLOW UP WILL BE DETERMINED PENDING THE RESULTS OF YOUR CT SCAN  Any Other Special Instructions Will Be Listed Below (If Applicable).

## 2019-05-29 NOTE — Telephone Encounter (Addendum)
Called patient to inform her that Lurena Joiner said it was not necessary for her to wear another monitor. Informed her that she should just have her ct scan and we will go from there. She first questioned why I called her and how she only wanted to speak with Katie. I informed her that Joellen Jersey is a monitor tech and only performs monitors. I informed her that she does not need to wear a monitor per [facility]. She said she is having a new symptoms and was told by Curt Bears and Joellen Jersey that we have twenty-four hour monitors.  She is now requesting to wear a twenty-four hour monitor because she is having symptoms right now and states it is not necessary to wear a 30 day monitor again. She also states she wants me to ask Dr Percival Spanish to order her monitor and no one else. Informed patient that I would reach out to Dr Percival Spanish and give her a call once I hear back.

## 2019-05-29 NOTE — Assessment & Plan Note (Signed)
04/10/2019 Echo: EF 60-65%, no rwma, nl valves, 12/2014 Neg ETT. Multiple ED visits, multiple negative CTA for PE. Last ED visit 05/23/2019

## 2019-05-29 NOTE — Telephone Encounter (Signed)
Pt's most recent OV has been faxed to [place] with [facility] at the provided fax number. Nothing further needed.

## 2019-05-29 NOTE — Telephone Encounter (Signed)
Appointment with Doreene Burke PA 7/22

## 2019-05-29 NOTE — Telephone Encounter (Signed)
New Message         Patient is needing a call back concerning  getting a monitor. Pls call to advise.

## 2019-05-29 NOTE — Assessment & Plan Note (Signed)
On medications for this

## 2019-05-29 NOTE — Assessment & Plan Note (Signed)
Multiple monitors done- symptoms do not corollate with occasional ectopy

## 2019-05-29 NOTE — Telephone Encounter (Signed)
Called & spoke w/ [facility] regarding pt's inquiry of a PA for levalbuterol. Pharmacy tech I spoke w/ stated there was no order on file for levalbuterol. I let her know I would resend the order and verified they had our most up to date fax number [phone] in the event a PA request needs to be faxed to our office. Pharmacy tech expressed understanding.   As I went to send another levalbuterol, I received a pop-up message that this medication is not on patient's formulary. The alteratives provided were all derivatives of albuterol sulfate, which the pt has been clinically proven to not tolerate well. Will wait to hear from pharmacy and keep this encounter open to f/u on.

## 2019-05-29 NOTE — Telephone Encounter (Signed)
Discussed 24 hour monitor with Dr Percival Spanish, he does not want to order this until he sees how the CT results come back and will review these results and see if he feels another monitor is warranted.  Left detailed VM for pt as above.

## 2019-05-29 NOTE — Telephone Encounter (Signed)
There is no order for a monitor in the computer system and it looks like patient has already worn a monitor back in April-May and also had one 6 months ago in Dec-Jan. Does she need another one? Insurance possibly might not cover it.

## 2019-05-29 NOTE — Progress Notes (Signed)
Cardiology Office Note:    Date:  05/29/2019   ID:  FLORDIA KASSEM, DOB [birth year], MRN [id number]  PCP:  Forrest Moron, MD  Cardiologist:  Minus Breeding, MD  Electrophysiologist:  Virl Axe, MD   Referring MD: Forrest Moron, MD   Chief Complaint  Patient presents with  . Follow-up  F/U from ED visit 05/23/2019  History of Present Illness:    Brooke Turner is a 43 y.o. female with a hx of anxiety, chest pain and palpitations. She had a negative GXT in 2016 and a negative stress echo in Oct 2018.  She had normal echos in 2015 and June 2020.  She has worn multiple monitors which have been unrevealing, the last in April 2020.  She is in the ED frequently with c/o chest pain.  A coronary CTA was scheduled but not because the patient was reluctant to take Metoprolol, this has been rescheduled.   She was in the ED 05/23/2019 with c/o of 3 days of SSCP and Lt sided chest pain.  She describes a "burning" sensation as well as "pressure".  Her symptoms are not consistently exertional.  CXR, EKG and Troponin negative.   Past Medical History:  Diagnosis Date  . Anxiety   . Back pain   . Chest pain    a. 09/2014 Echo: EF 60-65%, no rwma, nl valves;  b. 12/2014 Neg ETT.  . Essential hypertension    a. 01/2016 Renal duplex: no RAS- ? renal cyst (renal u/s 4/17 - no cyst, nl study); b. 02/2016 24 hr BP Monitor: Mean SBP ~ 138 with Max of 198. Highest pressures noted in evening hours following placement of cuff.  . Fibroid uterus   . GERD (gastroesophageal reflux disease)   . Palpitations    a. 09/2014 nl event monitor.    Past Surgical History:  Procedure Laterality Date  . WISDOM TOOTH EXTRACTION      Current Medications: Current Meds  Medication Sig  . acetaminophen (TYLENOL) 500 MG tablet Take 500 mg by mouth every 6 (six) hours as needed (back pain/ cramps).   Marland Kitchen albuterol (PROVENTIL HFA;VENTOLIN HFA) 108 (90 Base) MCG/ACT inhaler Inhale 1-2 puffs into the  lungs every 6 (six) hours as needed for wheezing or shortness of breath.  . ALPRAZolam (NIRAVAM) 0.25 MG dissolvable tablet Take 0.25 mg by mouth at bedtime as needed for anxiety.  Marland Kitchen atenolol (TENORMIN) 25 MG tablet Take 1/4 tablet every morning  . blood glucose meter kit and supplies Test blood sugar twice daily. Dx code: R73.09  . Cyanocobalamin (VITAMIN B-12 PO) Take 1 tablet by mouth daily.  . famotidine (PEPCID) 20 MG tablet Take 1 tablet (20 mg total) by mouth 2 (two) times daily.  . fluticasone (FLONASE) 50 MCG/ACT nasal spray Place 2 sprays into both nostrils daily. (Patient taking differently: Place 2 sprays into both nostrils daily as needed (seasonal allergies). )  . glucose blood (ONETOUCH VERIO) test strip TEST BLOOD SUGAR DAILY  . levalbuterol (XOPENEX HFA) 45 MCG/ACT inhaler Inhale 1-2 puffs every 6 hours if needed- rescue inhaler  . LORazepam (ATIVAN) 0.5 MG tablet Take 1 tablet (0.5 mg total) by mouth every 6 (six) hours as needed for anxiety. (Patient taking differently: Take 0.25 mg by mouth 3 (three) times daily as needed for anxiety. )  . metoprolol tartrate (LOPRESSOR) 50 MG tablet Take 1 tablet (50 mg total) by mouth 2 (two) times daily. Take tablet 1-2 hours prior to Coronary CT  . Multiple  Vitamins-Minerals (MULTIVITAMIN GUMMIES ADULT PO) Take 2 tablets by mouth daily.   . Norethindrone-Ethinyl Estradiol-Fe Biphas (LO LOESTRIN FE) 1 MG-10 MCG / 10 MCG tablet Take 1 tablet by mouth at bedtime.   [name] DELICA LANCETS 37T MISC USE TO TEST BLOOD SUGAR DAILY  . pantoprazole (PROTONIX) 40 MG tablet Take 1 tablet (40 mg total) by mouth daily before breakfast.  . potassium chloride SA (K-DUR) 20 MEQ tablet Take 1 tablet (20 mEq total) by mouth daily.  . simethicone (MYLICON) 062 MG chewable tablet Chew 1 tablet (125 mg total) by mouth every 6 (six) hours as needed for flatulence.  Marland Kitchen SPIRIVA RESPIMAT 1.25 MCG/ACT AERS Inhale 1.25 mcg into the lungs daily.   Current  Facility-Administered Medications for the 05/29/19 encounter (Office Visit) with Erlene Quan, PA-C  Medication  . cyanocobalamin ((VITAMIN B-12)) injection 1,000 mcg     Allergies:   Penicillins, Tamiflu [oseltamivir phosphate], Tinidazole, and Diltiazem   Social History   Socioeconomic History  . Marital status: Single    Spouse name: N/A  . Number of children: 0  . Years of education: 81  . Highest education level: Not on file  Occupational History  . Occupation: Research scientist (physical sciences): [employer]  Social Needs  . Financial resource strain: Not on file  . Food insecurity    Worry: Not on file    Inability: Not on file  . Transportation needs    Medical: Not on file    Non-medical: Not on file  Tobacco Use  . Smoking status: Former Smoker    Packs/day: 0.25    Years: 10.00    Pack years: 2.50    Types: Cigarettes    Start date: 06/25/2000    Quit date: 08/19/2014    Years since quitting: 4.7  . Smokeless tobacco: Never Used  Substance and Sexual Activity  . Alcohol use: No    Alcohol/week: 0.0 standard drinks  . Drug use: No  . Sexual activity: Not Currently    Partners: Male    Birth control/protection: Pill  Lifestyle  . Physical activity    Days per week: Not on file    Minutes per session: Not on file  . Stress: Not on file  Relationships  . Social Herbalist on phone: Not on file    Gets together: Not on file    Attends religious service: Not on file    Active member of club or organization: Not on file    Attends meetings of clubs or organizations: Not on file    Relationship status: Not on file  Other Topics Concern  . Not on file  Social History Narrative   Patient lives at home alone .   Patient works full time at [employer]   Education college.   Right handed.   Caffeine none     Family History: The patient's family history includes Alzheimer's disease in her maternal grandmother; Cancer in her father; Diabetes (age  of onset: 54) in her father; Epilepsy in her mother; Heart disease in her maternal grandfather; Heart disease (age of onset: 37) in her father; Hypertension in her father; Seizures in her mother. There is no history of Colon cancer.  ROS:   Please see the history of present illness.   All other systems reviewed and are negative.  EKGs/Labs/Other Studies Reviewed:    The following studies were reviewed today:   EKG:  EKG is ordered today.  The ekg  ordered today demonstrates NSR, ST 101  Recent Labs: 10/08/2018: B Natriuretic Peptide 36.0 10/18/2018: Magnesium 2.1 02/28/2019: ALT 13 04/19/2019: TSH 0.600 05/22/2019: BUN 9; Creatinine, Ser 1.19; Hemoglobin 14.0; Platelets 257; Potassium 3.4; Sodium 138  Recent Lipid Panel    Component Value Date/Time   CHOL 172 10/25/2016 1850   TRIG 87 10/25/2016 1850   HDL 49 10/25/2016 1850   CHOLHDL 3.5 10/25/2016 1850   LDLCALC 106 (H) 10/25/2016 1850   LDLDIRECT 101 (H) 10/25/2016 1850    Physical Exam:    VS:  BP (!) 168/85   Pulse (!) 101   Temp (!) 95.4 F (35.2 C)   Wt 158 lb (71.7 kg)   BMI 27.12 kg/m     Wt Readings from Last 3 Encounters:  05/29/19 158 lb (71.7 kg)  05/23/19 159 lb (72.1 kg)  04/30/19 158 lb (71.7 kg)     GEN:  Well nourished, well developed in no acute distress HEENT: Normal NECK: No JVD; No carotid bruits LYMPHATICS: No lymphadenopathy CARDIAC: RRR, no murmurs, rubs, gallops RESPIRATORY:  Clear to auscultation without rales, wheezing or rhonchi  ABDOMEN: Soft, non-tender, non-distended MUSCULOSKELETAL:  No edema; No deformity  SKIN: Warm and dry NEUROLOGIC:  Alert and oriented x 3 PSYCHIATRIC:  Normal affect   ASSESSMENT:    Chest pain of uncertain etiology  02/05/2877 Echo: EF 60-65%, no rwma, nl valves, 12/2014 Neg ETT. Multiple ED visits, multiple negative CTA for PE. Last ED visit 05/23/2019  Palpitations Multiple monitors done- symptoms do not corollate with occasional ectopy  Anxiety about  health On medications for this  PLAN:    In order of problems listed above:  Reassured.  I encouraged her to follow through with coronary CTA as scheduled.  If normal refer back to PCP.  Medication Adjustments/Labs and Tests Ordered: Current medicines are reviewed at length with the patient today.  Concerns regarding medicines are outlined above.  Orders Placed This Encounter  Procedures  . EKG 12-Lead   No orders of the defined types were placed in this encounter.   Patient Instructions  Medication Instructions:  Your physician recommends that you continue on your current medications as directed. Please refer to the Current Medication list given to you today. If you need a refill on your cardiac medications before your next appointment, please call your pharmacy.   Lab work: NONE  If you have labs (blood work) drawn today and your tests are completely normal, you will receive your results only by: Marland Kitchen MyChart Message (if you have MyChart) OR . A paper copy in the mail If you have any lab test that is abnormal or we need to change your treatment, we will call you to review the results.  Testing/Procedures: NONE  Follow-Up: At [facility], you and your health needs are our priority.  As part of our continuing mission to provide you with exceptional heart care, we have created designated Provider Care Teams.  These Care Teams include your primary Cardiologist (physician) and Advanced Practice Providers (APPs -  Physician Assistants and Nurse Practitioners) who all work together to provide you with the care you need, when you need it. . FOLLOW UP WILL BE DETERMINED PENDING THE RESULTS OF YOUR CT SCAN  Any Other Special Instructions Will Be Listed Below (If Applicable).      Angelena Form, PA-C  05/29/2019 8:52 AM    [facility]

## 2019-05-31 NOTE — Telephone Encounter (Signed)
Received form from pt's pharmacy requesting an alternative inhaler to levalbuterol, as pt's insurance will not cover levalbuterol. I let CY know of this and CY agreed to beginning the process of prior authorization, as the pt cannot tolerate any alternative.   I have located a PA request form from [employer], [place]. I have filled out the basic details and placed the form in CY's look-at for follow-up. Once CY has reviewed and completed the form, I will fax it to the number provided on the form.   Routing to CY as an [place].

## 2019-05-31 NOTE — Telephone Encounter (Signed)
Form completed.

## 2019-05-31 NOTE — Telephone Encounter (Signed)
Thank you. I am placing this completed and signed form to fax. Will keep this encounter open to f/u on.

## 2019-06-04 ENCOUNTER — Other Ambulatory Visit: Payer: Self-pay | Admitting: Internal Medicine

## 2019-06-04 ENCOUNTER — Telehealth (HOSPITAL_COMMUNITY): Payer: Self-pay | Admitting: Emergency Medicine

## 2019-06-04 NOTE — Telephone Encounter (Signed)
Left message on voicemail with name and callback number Rileigh Kawashima RN Navigator Cardiac Imaging [facility] [phone] Office [phone] Cell  

## 2019-06-04 NOTE — Telephone Encounter (Addendum)
Front staff attempted to fax paperwork x2, line was busy. I attempted to start PA on CoverMyMeds, however, an error occurred x3. I have faxed the paperwork today 06/04/2019, which I have received a successful confirmation from.   Will await paperwork back from [place]. Nothing further needed at this time.

## 2019-06-05 ENCOUNTER — Telehealth: Payer: Self-pay | Admitting: Cardiology

## 2019-06-05 ENCOUNTER — Telehealth (HOSPITAL_COMMUNITY): Payer: Self-pay | Admitting: Emergency Medicine

## 2019-06-05 NOTE — Telephone Encounter (Signed)
Left a message for the patient to call back.  

## 2019-06-05 NOTE — Telephone Encounter (Signed)
Pt calling regarding cardiac CTA scheduled for tomorrow (7/30). Hesitant to take PO metoprolol for CTA as she has taken it before in the past. Pt also expresses that she has a lot going on in her personal life and believes the stress is going to effect her test negatively. Pt wishing to reschedule for another time next week.  Will inform ordering provider   Marchia Bond RN Navigator Cardiac Imaging [facility] [phone] Office  [phone] Cell

## 2019-06-05 NOTE — Telephone Encounter (Signed)
  Patient is requesting to speak to the nurse in regards to the issue of heart flutters she was having. She states that she saw L Kilroy in regards to this but she was not happy with her visit. She had talked to [place] previously about wearing a heart monitor. Please call.

## 2019-06-05 NOTE — Telephone Encounter (Signed)
Ok to reschedule at her convenience. She will have to take the metoprolol pre CT, one dose should not drop her B/P if she does not want to she'll need some other kind of test  Kerin Ransom PA-C 06/05/2019 4:44 PM

## 2019-06-05 NOTE — Telephone Encounter (Signed)
Returned patient's call, no answer, left voice message with callback number.

## 2019-06-05 NOTE — Telephone Encounter (Signed)
Patient returning call.

## 2019-06-06 ENCOUNTER — Ambulatory Visit (HOSPITAL_COMMUNITY): Payer: Managed Care, Other (non HMO)

## 2019-06-06 ENCOUNTER — Encounter (HOSPITAL_BASED_OUTPATIENT_CLINIC_OR_DEPARTMENT_OTHER): Payer: Managed Care, Other (non HMO)

## 2019-06-07 ENCOUNTER — Other Ambulatory Visit (HOSPITAL_COMMUNITY): Payer: Managed Care, Other (non HMO)

## 2019-06-08 ENCOUNTER — Other Ambulatory Visit (HOSPITAL_COMMUNITY)
Admission: RE | Admit: 2019-06-08 | Discharge: 2019-06-08 | Disposition: A | Discharge status: Home / Self Care | Payer: Managed Care, Other (non HMO) | Source: Ambulatory Visit | Attending: Internal Medicine | Admitting: Internal Medicine

## 2019-06-08 DIAGNOSIS — Z01812 Encounter for preprocedural laboratory examination: Secondary | ICD-10-CM | POA: Diagnosis not present

## 2019-06-08 DIAGNOSIS — Z20828 Contact with and (suspected) exposure to other viral communicable diseases: Secondary | ICD-10-CM | POA: Insufficient documentation

## 2019-06-08 LAB — SARS CORONAVIRUS 2 (TAT 6-24 HRS): SARS Coronavirus 2: NEGATIVE

## 2019-06-10 ENCOUNTER — Encounter (HOSPITAL_BASED_OUTPATIENT_CLINIC_OR_DEPARTMENT_OTHER): Payer: Managed Care, Other (non HMO) | Admitting: Cardiology

## 2019-06-10 NOTE — Telephone Encounter (Signed)
Tried to call-phone just keeps ringing/ will try again later

## 2019-06-11 ENCOUNTER — Telehealth (HOSPITAL_COMMUNITY): Payer: Self-pay | Admitting: Emergency Medicine

## 2019-06-11 ENCOUNTER — Ambulatory Visit (HOSPITAL_BASED_OUTPATIENT_CLINIC_OR_DEPARTMENT_OTHER): Payer: Managed Care, Other (non HMO) | Attending: Internal Medicine | Admitting: Internal Medicine

## 2019-06-11 ENCOUNTER — Other Ambulatory Visit: Payer: Self-pay

## 2019-06-11 DIAGNOSIS — G4733 Obstructive sleep apnea (adult) (pediatric): Secondary | ICD-10-CM | POA: Insufficient documentation

## 2019-06-11 DIAGNOSIS — Z79899 Other long term (current) drug therapy: Secondary | ICD-10-CM | POA: Diagnosis not present

## 2019-06-11 DIAGNOSIS — G4700 Insomnia, unspecified: Secondary | ICD-10-CM | POA: Insufficient documentation

## 2019-06-11 DIAGNOSIS — I10 Essential (primary) hypertension: Secondary | ICD-10-CM | POA: Insufficient documentation

## 2019-06-11 NOTE — Telephone Encounter (Signed)
Left message on voicemail with name and callback number Jovani Flury RN Navigator Cardiac Imaging [place] [facility] [phone] Office [phone] Cell  

## 2019-06-12 ENCOUNTER — Ambulatory Visit: Payer: Managed Care, Other (non HMO) | Admitting: Internal Medicine

## 2019-06-12 ENCOUNTER — Ambulatory Visit (HOSPITAL_COMMUNITY): Payer: Managed Care, Other (non HMO)

## 2019-06-13 ENCOUNTER — Telehealth: Payer: Self-pay | Admitting: Cardiology

## 2019-06-13 NOTE — Telephone Encounter (Signed)
New Message     Pt is calling, she says she has questions and would like for the nurse to call her   Please call after 430

## 2019-06-14 ENCOUNTER — Telehealth: Payer: Self-pay | Admitting: Internal Medicine

## 2019-06-14 ENCOUNTER — Telehealth (HOSPITAL_COMMUNITY): Payer: Self-pay | Admitting: Emergency Medicine

## 2019-06-14 NOTE — Telephone Encounter (Signed)
Called the number patient provided [phone] [employer]. I connected with Harmon Pier with [employer] to initiate a verbal PA for levalbuterol 45 mcg/act inhaler 1 inhaler 30 days. Pt has been approved of this medication from July 8th 2020-August 7th 2021. Harmon Pier stated she will let pt know of this determination and fax the approval to our fax number [phone]. Routing to CY as an [place]. Nothing further needed at this time.

## 2019-06-14 NOTE — Telephone Encounter (Signed)
Called patient, she had a few questions. She is to have CTA next week- but she is concerned with taking the entire Metoprolol dose along with what she already takes as she feels it will bring her HR down to low, she is wanting to know if she could take half of the tablet 1 hour before, and then see if it brings it down.   Also patient is still asking to do the Holter monitor because she continues to feel that her heart is fluttering and beating harder because she can feel it, and wants to make sure it is not dangerous for her to continue to be this way. I advised with patient that currently we have to do the test to see what could be causing it, but patient is anxious. Advised I would route a message to MD to make aware of questions.

## 2019-06-14 NOTE — Telephone Encounter (Signed)
Left message on voicemail with name and callback number Amiliah Campisi RN Navigator Cardiac Imaging [place] [facility] [phone] Office [phone] Cell  

## 2019-06-15 ENCOUNTER — Other Ambulatory Visit (HOSPITAL_COMMUNITY): Payer: Managed Care, Other (non HMO)

## 2019-06-15 DIAGNOSIS — G4733 Obstructive sleep apnea (adult) (pediatric): Secondary | ICD-10-CM | POA: Diagnosis not present

## 2019-06-15 NOTE — Procedures (Signed)
    Patient Name: Brooke Turner, Brooke Turner Date: 06/11/2019 Gender: Female D.O.B: [birth year] Age (years): 65 Referring Provider: Baird Lyons MD, ABSM Height (inches): 64 Interpreting Physician: Baird Lyons MD, ABSM Weight (lbs): 158 RPSGT: Carolin Coy BMI: 27 MRN: [id number] Neck Size: 12.50  CLINICAL INFORMATION Sleep Study Type: NPSG Indication for sleep study: Fatigue, Hypertension, OSA Epworth Sleepiness Score: 5  Most recent polysomnogram dated 01/19/2016 revealed an AHI of 0.2/h and RDI of 2.0/h.  SLEEP STUDY TECHNIQUE As per the AASM Manual for the Scoring of Sleep and Associated Events v2.3 (April 2016) with a hypopnea requiring 4% desaturations.  The channels recorded and monitored were frontal, central and occipital EEG, electrooculogram (EOG), submentalis EMG (chin), nasal and oral airflow, thoracic and abdominal wall motion, anterior tibialis EMG, snore microphone, electrocardiogram, and pulse oximetry.  MEDICATIONS Medications self-administered by patient taken the night of the study : ATENOLOL, LORAZEPAM  SLEEP ARCHITECTURE The study was initiated at 11:10:05 PM and ended at 5:13:47 AM.  Sleep onset time was 56.9 minutes and the sleep efficiency was 46.9%%. The total sleep time was 170.5 minutes.  Stage REM latency was 67.5 minutes.  The patient spent 16.1%% of the night in stage N1 sleep, 76.0%% in stage N2 sleep, 0.0%% in stage N3 and 7.9% in REM.  Alpha intrusion was absent.  Supine sleep was 45.45%.  RESPIRATORY PARAMETERS The overall apnea/hypopnea index (AHI) was 0.0 per hour. There were 0 total apneas, including 0 obstructive, 0 central and 0 mixed apneas. There were 0 hypopneas and 29 RERAs.  The AHI during Stage REM sleep was 0.0 per hour.  AHI while supine was 0.0 per hour.  The mean oxygen saturation was 98.0%. The minimum SpO2 during sleep was 95.0%.  snoring was noted during this study.  CARDIAC DATA The 2 lead EKG demonstrated  sinus rhythm. The mean heart rate was 68.7 beats per minute. Other EKG findings include: None.  LEG MOVEMENT DATA The total PLMS were 0 with a resulting PLMS index of 0.0. Associated arousal with leg movement index was 0.0 .  IMPRESSIONS - No significant obstructive sleep apnea occurred during this study (AHI = 0.0/h). - No significant central sleep apnea occurred during this study (CAI = 0.0/h). - The patient had minimal or no oxygen desaturation during the study (Min O2 = 95.0%) - No snoring was audible during this study. - No cardiac abnormalities were noted during this study. - Clinically significant periodic limb movements did not occur during sleep. No significant associated arousals. - Patient had difficulty initiating and maintaining sleep despite lorazepam.  DIAGNOSIS - Insomnia  RECOMMENDATIONS - Manage for insomnia and symptoms as clinically indicated. - Sleep hygiene should be reviewed to assess factors that may improve sleep quality. - Weight management and regular exercise should be initiated or continued if appropriate.  [Electronically signed] 06/15/2019 11:21 AM  Baird Lyons MD, ABSM Diplomate, American Board of Sleep Medicine   NPI: [id number]                         [place] [place] [facility]  ELECTRONICALLY SIGNED ON:  06/15/2019, 11:19 AM [place] PH: [phone]   FX: [phone] [place]

## 2019-06-17 ENCOUNTER — Telehealth (HOSPITAL_COMMUNITY): Payer: Self-pay | Admitting: Emergency Medicine

## 2019-06-17 NOTE — Telephone Encounter (Signed)
She needs to take the beta blocker as we prescribed.  I do not think that she needs a Holter at this time.

## 2019-06-17 NOTE — Telephone Encounter (Signed)
Pt calling once more to discuss cardiac cta instructions and express concerns about taking prescribed beta blocker prior to appointment. Pt also asking about nitroglycerin and what its purpose is and what side effects she will get from that medication. Pt also stating that she gets positional tachycardia and her heart rate increases when she lies flat. Pt also states she had a CT angio chest a couple months ago and why they cant use that scan to evaluate her heart and I explained that the timing of contrast, beta blockers, and nitroglycerin administration differentiates the two tests.  RN navigator phone number provided for further questions regarding tomorrows cardiac CTA. Pt verbalized understanding. Marchia Bond RN Navigator Cardiac Imaging [facility] [phone] Office  [phone] Cell

## 2019-06-18 ENCOUNTER — Other Ambulatory Visit: Payer: Self-pay

## 2019-06-18 ENCOUNTER — Ambulatory Visit (HOSPITAL_COMMUNITY): Payer: Managed Care, Other (non HMO)

## 2019-06-18 ENCOUNTER — Encounter (HOSPITAL_BASED_OUTPATIENT_CLINIC_OR_DEPARTMENT_OTHER): Payer: Managed Care, Other (non HMO) | Admitting: Internal Medicine

## 2019-06-18 ENCOUNTER — Ambulatory Visit (HOSPITAL_COMMUNITY)
Admission: RE | Admit: 2019-06-18 | Discharge: 2019-06-18 | Disposition: A | Discharge status: Home / Self Care | Payer: Managed Care, Other (non HMO) | Source: Ambulatory Visit | Attending: Internal Medicine | Admitting: Internal Medicine

## 2019-06-18 ENCOUNTER — Telehealth: Payer: Self-pay | Admitting: Family Medicine

## 2019-06-18 DIAGNOSIS — R0789 Other chest pain: Secondary | ICD-10-CM | POA: Insufficient documentation

## 2019-06-18 MED ORDER — METOPROLOL TARTRATE 5 MG/5ML IV SOLN
5.0000 mg | INTRAVENOUS | Status: DC | PRN
  Administered 2019-06-18 (×4): 5 mg via INTRAVENOUS
  Filled 2019-06-18 (×5): qty 5

## 2019-06-18 MED ORDER — NITROGLYCERIN 0.4 MG SL SUBL
0.8000 mg | SUBLINGUAL_TABLET | Freq: Once | SUBLINGUAL | Status: AC
  Administered 2019-06-18: 17:00:00 0.8 mg via SUBLINGUAL
  Filled 2019-06-18: qty 25

## 2019-06-18 MED ORDER — IOHEXOL 350 MG/ML SOLN
80.0000 mL | Freq: Once | INTRAVENOUS | Status: AC | PRN
  Administered 2019-06-18: 80 mL via INTRAVENOUS

## 2019-06-18 MED ORDER — METOPROLOL TARTRATE 5 MG/5ML IV SOLN
INTRAVENOUS | Status: AC
  Administered 2019-06-18: 5 mg via INTRAVENOUS
  Filled 2019-06-18: qty 20

## 2019-06-18 MED ORDER — NITROGLYCERIN 0.4 MG SL SUBL
SUBLINGUAL_TABLET | SUBLINGUAL | Status: AC
  Filled 2019-06-18: qty 2

## 2019-06-18 NOTE — Telephone Encounter (Signed)
Pt has a possible UTI and wants to know if Dr. Nolon Rod or Dr. Pamella Pert will overbook and see her in the office as soon as possible. Please advise.

## 2019-06-19 ENCOUNTER — Other Ambulatory Visit (HOSPITAL_COMMUNITY): Payer: Managed Care, Other (non HMO)

## 2019-06-19 ENCOUNTER — Telehealth: Payer: Self-pay | Admitting: Family Medicine

## 2019-06-19 NOTE — Telephone Encounter (Signed)
Pt does not want to come in for the possible clot in leg. I have sent to scheduling pool

## 2019-06-19 NOTE — Telephone Encounter (Signed)
Pt would like a referral for a possible blood clot in her leg. I tried to explain to her she would need to be seen for this. She was insistent. Please advise [phone]

## 2019-06-20 NOTE — Telephone Encounter (Signed)
Patient had CTA completed, appointment with Dr.Hochrein tomorrow.

## 2019-06-20 NOTE — Telephone Encounter (Signed)
Left message on voicemail to return office call re: appt. Dgaddy, CMA

## 2019-06-21 ENCOUNTER — Ambulatory Visit: Payer: Managed Care, Other (non HMO) | Admitting: Cardiology

## 2019-06-21 ENCOUNTER — Other Ambulatory Visit: Payer: Self-pay | Admitting: Gastroenterology

## 2019-06-25 ENCOUNTER — Encounter: Payer: Self-pay | Admitting: Cardiology

## 2019-06-25 ENCOUNTER — Ambulatory Visit (INDEPENDENT_AMBULATORY_CARE_PROVIDER_SITE_OTHER): Payer: Managed Care, Other (non HMO) | Admitting: Cardiology

## 2019-06-25 ENCOUNTER — Other Ambulatory Visit: Payer: Self-pay

## 2019-06-25 VITALS — BP 140/86 | HR 76 | Temp 98.2°F | Ht 61.0 in | Wt 167.0 lb

## 2019-06-25 DIAGNOSIS — R079 Chest pain, unspecified: Secondary | ICD-10-CM

## 2019-06-25 DIAGNOSIS — R002 Palpitations: Secondary | ICD-10-CM

## 2019-06-25 DIAGNOSIS — R0789 Other chest pain: Secondary | ICD-10-CM | POA: Diagnosis not present

## 2019-06-25 NOTE — Patient Instructions (Signed)

## 2019-06-25 NOTE — Progress Notes (Signed)
Cardiology Office Note   Date:  06/25/2019   ID:  KARAN INCLAN, DOB [birth year], MRN [id number]  PCP:  Forrest Moron, MD  Cardiologist:   Minus Breeding, MD  Chief Complaint  Patient presents with  . Palpitations      History of Present Illness: Brooke Turner is a 43 y.o. female who presents for follow up of chest pain and palpitations. She had a negative GXT in 2016 and a negative stress echo in Oct 2018.  She had normal echos in 2015 and June 2020.  She has worn multiple monitors which have been unrevealing, the last in April 2020.  She is in the ED frequently with c/o chest pain.  A coronary CTA was scheduled but not because the patient was reluctant to take Metoprolol, this has been rescheduled.   She did have this done last week.  This demonstrated 0 calcium and normal coronaries.  She has had some discomfort shooting down her left leg occasionally.  She continues to have some strong heartbeats.  He has some shortness of breath.  She finds she has some slightly decreased exercise tolerance.  She has no presyncope or syncope however.  She does remain anxious.    Past Medical History:  Diagnosis Date  . Anxiety   . Back pain   . Chest pain    a. 09/2014 Echo: EF 60-65%, no rwma, nl valves;  b. 12/2014 Neg ETT.  . Essential hypertension    a. 01/2016 Renal duplex: no RAS- ? renal cyst (renal u/s 4/17 - no cyst, nl study); b. 02/2016 24 hr BP Monitor: Mean SBP ~ 138 with Max of 198. Highest pressures noted in evening hours following placement of cuff.  . Fibroid uterus   . GERD (gastroesophageal reflux disease)   . Palpitations    a. 09/2014 nl event monitor.    Past Surgical History:  Procedure Laterality Date  . WISDOM TOOTH EXTRACTION       Current Outpatient Medications  Medication Sig Dispense Refill  . acetaminophen (TYLENOL) 500 MG tablet Take 500 mg by mouth every 6 (six) hours as needed (back pain/ cramps).     Marland Kitchen albuterol (PROVENTIL  HFA;VENTOLIN HFA) 108 (90 Base) MCG/ACT inhaler Inhale 1-2 puffs into the lungs every 6 (six) hours as needed for wheezing or shortness of breath. 1 Inhaler 0  . ALPRAZolam (NIRAVAM) 0.25 MG dissolvable tablet Take 0.25 mg by mouth at bedtime as needed for anxiety.    Marland Kitchen atenolol (TENORMIN) 25 MG tablet Take 1/4 tablet every morning 180 tablet 3  . blood glucose meter kit and supplies Test blood sugar twice daily. Dx code: R73.09 1 each 0  . Cyanocobalamin (VITAMIN B-12 PO) Take 1 tablet by mouth daily.    . famotidine (PEPCID) 20 MG tablet Take 1 tablet (20 mg total) by mouth 2 (two) times daily. 180 tablet 0  . fluticasone (FLONASE) 50 MCG/ACT nasal spray Place 2 sprays into both nostrils daily. (Patient taking differently: Place 2 sprays into both nostrils daily as needed (seasonal allergies). ) 16 g 0  . glucose blood (ONETOUCH VERIO) test strip TEST BLOOD SUGAR DAILY 25 each 10  . levalbuterol (XOPENEX HFA) 45 MCG/ACT inhaler Inhale 1-2 puffs every 6 hours if needed- rescue inhaler 1 Inhaler 12  . LORazepam (ATIVAN) 0.5 MG tablet Take 1 tablet (0.5 mg total) by mouth every 6 (six) hours as needed for anxiety. (Patient taking differently: Take 0.25 mg by mouth 3 (three) times  daily as needed for anxiety. ) 15 tablet 0  . Multiple Vitamins-Minerals (MULTIVITAMIN GUMMIES ADULT PO) Take 2 tablets by mouth daily.     . Norethindrone-Ethinyl Estradiol-Fe Biphas (LO LOESTRIN FE) 1 MG-10 MCG / 10 MCG tablet Take 1 tablet by mouth at bedtime.     [name] DELICA LANCETS 71I MISC USE TO TEST BLOOD SUGAR DAILY 100 each 3  . pantoprazole (PROTONIX) 40 MG tablet TAKE 1 TABLET BY MOUTH ONCE DAILY BEFORE BREAKFAST 90 tablet 0  . SPIRIVA RESPIMAT 1.25 MCG/ACT AERS Inhale 1.25 mcg into the lungs daily.     Current Facility-Administered Medications  Medication Dose Route Frequency Provider Last Rate Last Dose  . cyanocobalamin ((VITAMIN B-12)) injection 1,000 mcg  1,000 mcg Intramuscular Q30 days [name],  MD   1,000 mcg at 10/19/18 9678    Allergies:   Penicillins, Tamiflu [oseltamivir phosphate], Tinidazole, and Diltiazem    ROS:  Please see the history of present illness.   Otherwise, review of systems are positive for none.   All other systems are reviewed and negative.    PHYSICAL EXAM: VS:  BP 140/86 (BP Location: Left Arm, Patient Position: Sitting, Cuff Size: Normal)   Pulse 76   Temp 98.2 F (36.8 C)   Ht 5' 1"  (1.549 m)   Wt 167 lb (75.8 kg)   BMI 31.55 kg/m  , BMI Body mass index is 31.55 kg/m. GENERAL:  Well appearing NECK:  No jugular venous distention, waveform within normal limits, carotid upstroke brisk and symmetric, no bruits, no thyromegaly LUNGS:  Clear to auscultation bilaterally CHEST:  Unremarkable HEART:  PMI not displaced or sustained,S1 and S2 within normal limits, no S3, no S4, no clicks, no rubs, no murmurs ABD:  Flat, positive bowel sounds normal in frequency in pitch, no bruits, no rebound, no guarding, no midline pulsatile mass, no hepatomegaly, no splenomegaly EXT:  2 plus pulses throughout, no edema, no cyanosis no clubbing   EKG:  EKG is ordered today. The ekg ordered today demonstrates normal sinus rhythm, rate 76, axis within normal limits, intervals within normal limits, no acute ST-T wave changes.   Recent Labs: 10/08/2018: B Natriuretic Peptide 36.0 10/18/2018: Magnesium 2.1 02/28/2019: ALT 13 04/19/2019: TSH 0.600 05/22/2019: BUN 9; Creatinine, Ser 1.19; Hemoglobin 14.0; Platelets 257; Potassium 3.4; Sodium 138    Lipid Panel    Component Value Date/Time   CHOL 172 10/25/2016 1850   TRIG 87 10/25/2016 1850   HDL 49 10/25/2016 1850   CHOLHDL 3.5 10/25/2016 1850   LDLCALC 106 (H) 10/25/2016 1850   LDLDIRECT 101 (H) 10/25/2016 1850      Wt Readings from Last 3 Encounters:  06/25/19 167 lb (75.8 kg)  06/11/19 158 lb (71.7 kg)  05/29/19 158 lb (71.7 kg)      Other studies Reviewed: Additional studies/ records that were reviewed  today include: CT. Review of the above records demonstrates:  Please see elsewhere in the note.     ASSESSMENT AND PLAN:  CHEST PAIN:   The patient has atypical chest pain and no evidence of structural heart disease including no evidence of coronary disease.  No change in therapy is indicated.  PALPITATIONS: I think she feels that the rare PVCs and PACs that she has.  When she wore a monitor of many of her symptoms were related to normal sinus rhythm.  We talked about PRN dosing of her atenolol and she will try some of the suggestions that I offered.  No  further testing is indicated.   Current medicines are reviewed at length with the patient today.  The patient does not have concerns regarding medicines.  The following changes have been made:  no change  Labs/ tests ordered today include: None No orders of the defined types were placed in this encounter.    Disposition:   FU with me in 12 moths.      Signed, Minus Breeding, MD  06/25/2019 6:04 PM    [place]

## 2019-06-26 ENCOUNTER — Ambulatory Visit: Payer: Managed Care, Other (non HMO) | Admitting: Family Medicine

## 2019-06-27 ENCOUNTER — Ambulatory Visit: Payer: Managed Care, Other (non HMO) | Admitting: Family Medicine

## 2019-06-28 ENCOUNTER — Ambulatory Visit (INDEPENDENT_AMBULATORY_CARE_PROVIDER_SITE_OTHER): Payer: Managed Care, Other (non HMO) | Admitting: Family Medicine

## 2019-06-28 ENCOUNTER — Encounter: Payer: Self-pay | Admitting: Family Medicine

## 2019-06-28 ENCOUNTER — Other Ambulatory Visit: Payer: Self-pay

## 2019-06-28 VITALS — BP 143/84 | HR 74 | Temp 98.7°F | Ht 61.0 in | Wt 167.0 lb

## 2019-06-28 DIAGNOSIS — M79662 Pain in left lower leg: Secondary | ICD-10-CM

## 2019-06-28 DIAGNOSIS — M546 Pain in thoracic spine: Secondary | ICD-10-CM

## 2019-06-28 DIAGNOSIS — G8929 Other chronic pain: Secondary | ICD-10-CM | POA: Diagnosis not present

## 2019-06-28 DIAGNOSIS — R300 Dysuria: Secondary | ICD-10-CM

## 2019-06-28 NOTE — Patient Instructions (Signed)
° ° ° °  If you have lab work done today you will be contacted with your lab results within the next 2 weeks.  If you have not heard from us then please contact us. The fastest way to get your results is to register for My Chart. ° ° °IF you received an x-ray today, you will receive an invoice from [facility]. Please contact [facility] at [phone] with questions or concerns regarding your invoice.  ° °IF you received labwork today, you will receive an invoice from [facility]. Please contact [facility] at [phone] with questions or concerns regarding your invoice.  ° °Our billing staff will not be able to assist you with questions regarding bills from these companies. ° °You will be contacted with the lab results as soon as they are available. The fastest way to get your results is to activate your My Chart account. Instructions are located on the last page of this paperwork. If you have not heard from us regarding the results in 2 weeks, please contact this office. °  ° ° ° °

## 2019-06-28 NOTE — Progress Notes (Signed)
7/51/02585:27 PM  Brooke Turner [birth year], 43 y.o., female [id number]  Chief Complaint  Patient presents with  . Pain    pain in left leg and thigh, does alot of sitting wrking from [employer]. Pain in the left rib area with inhale.   Marland Kitchen Urinary Urgency    last week with some ab pain    HPI:   Patient is a 43 y.o. female with past medical history significant for HTN, GERD, asthma,   who presents today for left leg pain  Chronic intermittent left calf pain Sometimes with walking  But then last week started having pain in her left thigh that is now coming and going She is worried it might be a blood clot She is on OCPs and has become sedentary since working at home Denies any swelling, redness or warmth  She also then started having palpitations and left lower chest wall specially with movement/deep breathing, she has been doing core exercises She does not smoke Her father has h/o VTE  This past week has been having lower abd pain Urgency with low output, discomfort No fever or chills, no nausea or vomiting  Has chronic thoracic back pain, getting worse Has done PT multiple times - has never helped At times so intense and electrifying that it causes severe anxiety Xray in 2018 shows bone spurs Requesting referral to spine surgeon  Depression screen [facility] 2/9 06/28/2019 04/24/2019 03/19/2019  Decreased Interest 0 0 0  Down, Depressed, Hopeless 0 0 0  PHQ - 2 Score 0 0 0  Altered sleeping - - -  Tired, decreased energy - - -  Change in appetite - - -  Feeling bad or failure about yourself  - - -  Trouble concentrating - - -  Moving slowly or fidgety/restless - - -  Suicidal thoughts - - -  PHQ-9 Score - - -  Some recent data might be hidden    Fall Risk  06/28/2019 04/24/2019 03/19/2019 12/31/2018 10/19/2018  Falls in the past year? 0 1 0 1 1  Number falls in past yr: 0 0 0 0 0  Injury with Fall? 0 1 0 0 0  Comment - left knee 07/2018 - - -  Follow up - Falls  evaluation completed Falls evaluation completed Falls evaluation completed -     Allergies  Allergen Reactions  . Penicillins Hives    Has patient had a PCN reaction causing immediate rash, facial/tongue/throat swelling, SOB or lightheadedness with hypotension:Yes Has patient had a PCN reaction causing severe rash involving mucus membranes or skin necrosis:unsure Has patient had a PCN reaction that required hospitalization: Yes Has patient had a PCN reaction occurring within the last 10 years: No If all of the above answers are "NO", then may proceed with Cephalosporin use.     . Tamiflu [Oseltamivir Phosphate] Nausea And Vomiting    Excessive vomiting  . Tinidazole Other (See Comments)    Caused severe abd pain/type of colitis   . Diltiazem Other (See Comments)    Dizzy, felt like she was going to pass out Blood pressure went up per patient     Prior to Admission medications   Medication Sig Start Date End Date Taking? Authorizing Provider  acetaminophen (TYLENOL) 500 MG tablet Take 500 mg by mouth every 6 (six) hours as needed (back pain/ cramps).    Yes [provider]  albuterol (PROVENTIL HFA;VENTOLIN HFA) 108 (90 Base) MCG/ACT inhaler Inhale 1-2 puffs into the lungs every 6 (six)  hours as needed for wheezing or shortness of breath. 02/07/19  Yes Madilyn Hook A, PA-C  ALPRAZolam (NIRAVAM) 0.25 MG dissolvable tablet Take 0.25 mg by mouth at bedtime as needed for anxiety.   Yes [provider]  atenolol (TENORMIN) 25 MG tablet Take 1/4 tablet every morning 04/25/19  Yes Alvstad, Kristin L, RPH-CPP  blood glucose meter kit and supplies Test blood sugar twice daily. Dx code: R73.09 04/25/19  Yes Corum, Rex Kras, MD  Cyanocobalamin (VITAMIN B-12 PO) Take 1 tablet by mouth daily.   Yes [provider]  famotidine (PEPCID) 20 MG tablet Take 1 tablet (20 mg total) by mouth 2 (two) times daily. 11/20/18  Yes [name], MD  fluticasone [facility]) 50 MCG/ACT nasal  spray Place 2 sprays into both nostrils daily. Patient taking differently: Place 2 sprays into both nostrils daily as needed (seasonal allergies).  10/10/17  Yes Timmothy Euler, [place] D, PA-C  glucose blood [facility] VERIO) test strip TEST BLOOD SUGAR DAILY 11/16/18  Yes [name], MD  levalbuterol [facility] HFA) 45 MCG/ACT inhaler Inhale 1-2 puffs every 6 hours if needed- rescue inhaler 05/29/19  Yes Young, Tarri Fuller D, MD  LORazepam (ATIVAN) 0.5 MG tablet Take 1 tablet (0.5 mg total) by mouth every 6 (six) hours as needed for anxiety. Patient taking differently: Take 0.25 mg by mouth 3 (three) times daily as needed for anxiety.  03/19/19  Yes Forrest Moron, MD  Multiple Vitamins-Minerals (MULTIVITAMIN GUMMIES ADULT PO) Take 2 tablets by mouth daily.    Yes [provider]  Norethindrone-Ethinyl Estradiol-Fe Biphas (LO LOESTRIN FE) 1 MG-10 MCG / 10 MCG tablet Take 1 tablet by mouth at bedtime.    Yes [provider]  Jonetta Speak LANCETS 23R MISC USE TO TEST BLOOD SUGAR DAILY 11/16/18  Yes [name], MD  pantoprazole (PROTONIX) 40 MG tablet TAKE 1 TABLET BY MOUTH ONCE DAILY BEFORE BREAKFAST 06/21/19  Yes Thornton Park, MD  SPIRIVA RESPIMAT 1.25 MCG/ACT AERS Inhale 1.25 mcg into the lungs daily. 05/13/19  Yes [provider]    Past Medical History:  Diagnosis Date  . Anxiety   . Back pain   . Chest pain    a. 09/2014 Echo: EF 60-65%, no rwma, nl valves;  b. 12/2014 Neg ETT.  . Essential hypertension    a. 01/2016 Renal duplex: no RAS- ? renal cyst (renal u/s 4/17 - no cyst, nl study); b. 02/2016 24 hr BP Monitor: Mean SBP ~ 138 with Max of 198. Highest pressures noted in evening hours following placement of cuff.  . Fibroid uterus   . GERD (gastroesophageal reflux disease)   . Palpitations    a. 09/2014 nl event monitor.    Past Surgical History:  Procedure Laterality Date  . WISDOM TOOTH EXTRACTION      Social History   Tobacco Use  . Smoking status: Former  Smoker    Packs/day: 0.25    Years: 10.00    Pack years: 2.50    Types: Cigarettes    Start date: 06/25/2000    Quit date: 08/19/2014    Years since quitting: 4.8  . Smokeless tobacco: Never Used  Substance Use Topics  . Alcohol use: No    Alcohol/week: 0.0 standard drinks    Family History  Problem Relation Age of Onset  . Seizures Mother   . Epilepsy Mother   . Cancer Father        Liver  . Diabetes Father 70  . Hypertension  Father   . Heart disease Father 8       CEA, LE Stenting  . Alzheimer's disease Maternal Grandmother   . Heart disease Maternal Grandfather   . Colon cancer Neg Hx     ROS Per hpi  OBJECTIVE:  Today's Vitals   06/28/19 1649  BP: (!) 143/84  Pulse: 74  Temp: 98.7 F (37.1 C)  TempSrc: Oral  SpO2: 99%  Weight: 167 lb (75.8 kg)  Height: 5' 1"  (1.549 m)   Body mass index is 31.55 kg/m.   Physical Exam Vitals signs and nursing note reviewed.  Constitutional:      Appearance: She is well-developed.  HENT:     Head: Normocephalic and atraumatic.     Mouth/Throat:     Pharynx: No oropharyngeal exudate.  Eyes:     General: No scleral icterus.    Conjunctiva/sclera: Conjunctivae normal.     Pupils: Pupils are equal, round, and reactive to light.  Neck:     Musculoskeletal: Neck supple.  Cardiovascular:     Rate and Rhythm: Normal rate and regular rhythm.     Heart sounds: Normal heart sounds. No murmur. No friction rub. No gallop.   Pulmonary:     Effort: Pulmonary effort is normal.     Breath sounds: Normal breath sounds. No wheezing or rales.  Chest:     Chest wall: Swelling (spasms along inferior left anterior ribcage border) and tenderness present.  Musculoskeletal:     Left lower leg: She exhibits no tenderness and no swelling. No edema.       Legs:  Skin:    General: Skin is warm and dry.  Neurological:     Mental Status: She is alert and oriented to person, place, and time.     No results found for this or any  previous visit (from the past 24 hour(s)).  No results found.   ASSESSMENT and PLAN  1. Pain of left calf Reassured patient exam not suggestive of DVT. Checking d dimer to r/o. Consider vasc surg for eval of painful varicose vein - D-dimer, quantitative (not at [facility])  2. Dysuria - Urinalysis, Routine w reflex microscopic  3. Chronic midline thoracic back pain Referring as requested - Ambulatory referral to Spine Surgery  Return if symptoms worsen or fail to improve.    Rutherford Guys, MD Primary Care at [facility] [place], [place] [address] Ph.  [phone] Fax [phone]

## 2019-06-29 ENCOUNTER — Emergency Department (HOSPITAL_COMMUNITY)
Admission: EM | Admit: 2019-06-29 | Discharge: 2019-06-29 | Disposition: A | Discharge status: Home / Self Care | Payer: Managed Care, Other (non HMO) | Attending: Emergency Medicine | Admitting: Emergency Medicine

## 2019-06-29 ENCOUNTER — Encounter (HOSPITAL_COMMUNITY): Payer: Self-pay

## 2019-06-29 ENCOUNTER — Other Ambulatory Visit: Payer: Self-pay

## 2019-06-29 DIAGNOSIS — N939 Abnormal uterine and vaginal bleeding, unspecified: Secondary | ICD-10-CM

## 2019-06-29 DIAGNOSIS — Z79899 Other long term (current) drug therapy: Secondary | ICD-10-CM | POA: Diagnosis not present

## 2019-06-29 DIAGNOSIS — Z87891 Personal history of nicotine dependence: Secondary | ICD-10-CM | POA: Insufficient documentation

## 2019-06-29 DIAGNOSIS — R1030 Lower abdominal pain, unspecified: Secondary | ICD-10-CM | POA: Diagnosis not present

## 2019-06-29 DIAGNOSIS — I10 Essential (primary) hypertension: Secondary | ICD-10-CM | POA: Diagnosis not present

## 2019-06-29 DIAGNOSIS — R42 Dizziness and giddiness: Secondary | ICD-10-CM | POA: Diagnosis not present

## 2019-06-29 LAB — CBC WITH DIFFERENTIAL/PLATELET
Abs Immature Granulocytes: 0.01 10*3/uL (ref 0.00–0.07)
Basophils Absolute: 0 10*3/uL (ref 0.0–0.1)
Basophils Relative: 0 %
Eosinophils Absolute: 0 10*3/uL (ref 0.0–0.5)
Eosinophils Relative: 0 %
HCT: 40.6 % (ref 36.0–46.0)
Hemoglobin: 13.8 g/dL (ref 12.0–15.0)
Immature Granulocytes: 0 %
Lymphocytes Relative: 62 %
Lymphs Abs: 5.2 10*3/uL — ABNORMAL HIGH (ref 0.7–4.0)
MCH: 30.7 pg (ref 26.0–34.0)
MCHC: 34 g/dL (ref 30.0–36.0)
MCV: 90.2 fL (ref 80.0–100.0)
Monocytes Absolute: 0.6 10*3/uL (ref 0.1–1.0)
Monocytes Relative: 8 %
Neutro Abs: 2.5 10*3/uL (ref 1.7–7.7)
Neutrophils Relative %: 30 %
Platelets: 261 10*3/uL (ref 150–400)
RBC: 4.5 MIL/uL (ref 3.87–5.11)
RDW: 12.4 % (ref 11.5–15.5)
WBC: 8.4 10*3/uL (ref 4.0–10.5)
nRBC: 0 % (ref 0.0–0.2)

## 2019-06-29 LAB — URINALYSIS, ROUTINE W REFLEX MICROSCOPIC
Bilirubin Urine: NEGATIVE
Bilirubin, UA: NEGATIVE
Glucose, UA: NEGATIVE
Glucose, UA: NEGATIVE mg/dL
Ketones, UA: NEGATIVE
Ketones, ur: NEGATIVE mg/dL
Leukocytes,Ua: NEGATIVE
Nitrite, UA: NEGATIVE
Nitrite: NEGATIVE
Protein, ur: NEGATIVE mg/dL
Protein,UA: NEGATIVE
RBC, UA: NEGATIVE
Specific Gravity, UA: 1.018 (ref 1.005–1.030)
Specific Gravity, Urine: 1.018 (ref 1.005–1.030)
Urobilinogen, Ur: 0.2 mg/dL (ref 0.2–1.0)
pH, UA: 6.5 (ref 5.0–7.5)
pH: 6 (ref 5.0–8.0)

## 2019-06-29 LAB — MICROSCOPIC EXAMINATION
Casts: NONE SEEN /lpf
RBC, Urine: NONE SEEN /hpf (ref 0–2)

## 2019-06-29 LAB — BASIC METABOLIC PANEL
Anion gap: 12 (ref 5–15)
BUN: 11 mg/dL (ref 6–20)
CO2: 22 mmol/L (ref 22–32)
Calcium: 8.6 mg/dL — ABNORMAL LOW (ref 8.9–10.3)
Chloride: 106 mmol/L (ref 98–111)
Creatinine, Ser: 1.21 mg/dL — ABNORMAL HIGH (ref 0.44–1.00)
GFR calc Af Amer: >60 mL/min (ref >60)
GFR calc non Af Amer: 54 mL/min — ABNORMAL LOW (ref >60)
Glucose, Bld: 123 mg/dL — ABNORMAL HIGH (ref 70–99)
Potassium: 3.1 mmol/L — ABNORMAL LOW (ref 3.5–5.1)
Sodium: 140 mmol/L (ref 135–145)

## 2019-06-29 LAB — WET PREP, GENITAL
Clue Cells Wet Prep HPF POC: NONE SEEN
Sperm: NONE SEEN
Trich, Wet Prep: NONE SEEN
Yeast Wet Prep HPF POC: NONE SEEN

## 2019-06-29 LAB — PROTIME-INR
INR: 1 (ref 0.8–1.2)
Prothrombin Time: 13 seconds (ref 11.4–15.2)

## 2019-06-29 LAB — I-STAT BETA HCG BLOOD, ED (MC, WL, AP ONLY): I-stat hCG, quantitative: <5 m[IU]/mL (ref <5)

## 2019-06-29 LAB — TYPE AND SCREEN
ABO/RH(D): O POS
Antibody Screen: NEGATIVE

## 2019-06-29 LAB — D-DIMER, QUANTITATIVE: D-DIMER: 0.39 mg/L FEU (ref 0.00–0.49)

## 2019-06-29 NOTE — Discharge Instructions (Signed)
Your testing today shows that you have no signs of anemia, your bleeding has apparently stopped and there is no signs of infection.  If you should develop severe or worsening symptoms see your gynecologist however it would not be surprising if you had some bleeding over the next couple of days.  Do not be alarmed by this, if you do start to develop severe weakness shortness of breath or other severe or worsening symptoms call your gynecologist or return to the emergency department immediately.

## 2019-06-29 NOTE — ED Triage Notes (Signed)
Pt from home w/ a c/o lower abdominal pain and vaginal bleeding. Her abd is tender to palpation and feels like "menstrual cramps." Pt has noted vaginal bleeding and feels like she's going to lose consciousness.

## 2019-06-29 NOTE — ED Provider Notes (Signed)
[facility] EMERGENCY DEPARTMENT Provider Note   CSN: [id number] Arrival date & time: 06/29/19  1929     History   Chief Complaint Chief Complaint  Patient presents with  . Vaginal Bleeding    HPI Brooke Turner is a 43 y.o. female.     HPI  43 year old female, known history of fibroid uterus, she has had significant heavy bleeding in the past which has caused her to go on to birth control and over the last year while taking this medication she has not had any bleeding except for the occasional small amount of spotting.  Approximately 30 minutes ago the patient started to have heavy vaginal bleeding, she states it is "pouring" out of me.  She reports that she has been feeling lightheaded after seeing the start.  She was in her usual state of health prior to this.  She also notes over the last 2 days she has had a slight amount of left upper quadrant tenderness and then some lower abdominal cramping which started this evening.  She states that feels similar to prior cramping episodes.  She was not a surgical case in the past as she responded very well to medications.  Denies ever having to have a blood transfusion.  No nausea vomiting back pain fevers chills coughing or shortness of breath.  No medications given prior to arrival.  She states she did miss 1 dose of her birth control over the last week but took a double dose the next day to make up for it  Past Medical History:  Diagnosis Date  . Anxiety   . Back pain   . Chest pain    a. 09/2014 Echo: EF 60-65%, no rwma, nl valves;  b. 12/2014 Neg ETT.  . Essential hypertension    a. 01/2016 Renal duplex: no RAS- ? renal cyst (renal u/s 4/17 - no cyst, nl study); b. 02/2016 24 hr BP Monitor: Mean SBP ~ 138 with Max of 198. Highest pressures noted in evening hours following placement of cuff.  . Fibroid uterus   . GERD (gastroesophageal reflux disease)   . Palpitations    a. 09/2014 nl event monitor.     Patient Active Problem List   Diagnosis Date Noted  . OSA (obstructive sleep apnea) 06/11/2019  . Encounter for examination following treatment at hospital 04/25/2019  . Hypokalemia 04/25/2019  . Elevated glucose 04/25/2019  . History of posttraumatic stress disorder (PTSD) 12/31/2018  . History of panic attacks 12/31/2018  . Palpitations   . Chest pain of uncertain etiology   . Chronic tension-type headache, not intractable 06/10/2015  . Dyspnea 03/18/2015  . Post-traumatic stress reaction 02/25/2015  . Anxiety disorder due to general medical condition with panic attack 02/25/2015  . Subserous leiomyoma of uterus 02/25/2015  . Anxiety about health 02/25/2015  . Deviated nasal septum 12/30/2014  . Chronic rhinitis 12/30/2014  . Chronic maxillary sinusitis 12/30/2014  . Snorings 09/30/2014  . Prediabetes 08/26/2014  . Acanthosis nigricans 03/19/2014  . Vitamin D deficiency 07/15/2013  . Large breasts 09/12/2012  . Essential hypertension 02/17/2012  . Obesity (BMI 30-39.9) previous BMI was over 40. Patient has lost 70 pounds in the last year 02/17/2012  . Anxiety disorder 02/17/2012  . Chronic back pain 02/17/2012    Past Surgical History:  Procedure Laterality Date  . WISDOM TOOTH EXTRACTION       OB History    Gravida  2   Para  0   Term  0   Preterm  0   AB  2   Living  0     SAB  1   TAB  1   Ectopic  0   Multiple  0   Live Births               Home Medications    Prior to Admission medications   Medication Sig Start Date End Date Taking? Authorizing Provider  acetaminophen (TYLENOL) 500 MG tablet Take 500 mg by mouth every 6 (six) hours as needed (back pain/ cramps).     [provider]  albuterol (PROVENTIL HFA;VENTOLIN HFA) 108 (90 Base) MCG/ACT inhaler Inhale 1-2 puffs into the lungs every 6 (six) hours as needed for wheezing or shortness of breath. 02/07/19   Alveria Apley, PA-C  ALPRAZolam (NIRAVAM) 0.25 MG dissolvable tablet  Take 0.25 mg by mouth at bedtime as needed for anxiety.    [provider]  atenolol (TENORMIN) 25 MG tablet Take 1/4 tablet every morning 04/25/19   Alvstad, Kristin L, RPH-CPP  blood glucose meter kit and supplies Test blood sugar twice daily. Dx code: R73.09 04/25/19   Maryruth Hancock, MD  Cyanocobalamin (VITAMIN B-12 PO) Take 1 tablet by mouth daily.    [provider]  famotidine (PEPCID) 20 MG tablet Take 1 tablet (20 mg total) by mouth 2 (two) times daily. 11/20/18   [name], MD  fluticasone (FLONASE) 50 MCG/ACT nasal spray Place 2 sprays into both nostrils daily. Patient taking differently: Place 2 sprays into both nostrils daily as needed (seasonal allergies).  10/10/17   Tenna Delaine D, PA-C  glucose blood [facility] VERIO) test strip TEST BLOOD SUGAR DAILY 11/16/18   [name], MD  levalbuterol [facility] HFA) 45 MCG/ACT inhaler Inhale 1-2 puffs every 6 hours if needed- rescue inhaler 05/29/19   Deneise Lever, MD  LORazepam (ATIVAN) 0.5 MG tablet Take 1 tablet (0.5 mg total) by mouth every 6 (six) hours as needed for anxiety. Patient taking differently: Take 0.25 mg by mouth 3 (three) times daily as needed for anxiety.  03/19/19   Forrest Moron, MD  Multiple Vitamins-Minerals (MULTIVITAMIN GUMMIES ADULT PO) Take 2 tablets by mouth daily.     [provider]  Norethindrone-Ethinyl Estradiol-Fe Biphas (LO LOESTRIN FE) 1 MG-10 MCG / 10 MCG tablet Take 1 tablet by mouth at bedtime.     [provider]  Jonetta Speak LANCETS 51G MISC USE TO TEST BLOOD SUGAR DAILY 11/16/18   [name], MD  pantoprazole (PROTONIX) 40 MG tablet TAKE 1 TABLET BY MOUTH ONCE DAILY BEFORE BREAKFAST 06/21/19   Thornton Park, MD  SPIRIVA RESPIMAT 1.25 MCG/ACT AERS Inhale 1.25 mcg into the lungs daily. 05/13/19   [provider]    Family History Family History  Problem Relation Age of Onset  . Seizures Mother   . Epilepsy Mother   . Cancer Father        Liver   . Diabetes Father 47  . Hypertension Father   . Heart disease Father 1       CEA, LE Stenting  . Alzheimer's disease Maternal Grandmother   . Heart disease Maternal Grandfather   . Colon cancer Neg Hx     Social History Social History   Tobacco Use  . Smoking status: Former Smoker    Packs/day: 0.25    Years: 10.00    Pack years: 2.50    Types: Cigarettes    Start  date: 06/25/2000    Quit date: 08/19/2014    Years since quitting: 4.8  . Smokeless tobacco: Never Used  Substance Use Topics  . Alcohol use: No    Alcohol/week: 0.0 standard drinks  . Drug use: No     Allergies   Penicillins, Tamiflu [oseltamivir phosphate], Tinidazole, and Diltiazem   Review of Systems Review of Systems  All other systems reviewed and are negative.    Physical Exam Updated Vital Signs There were no vitals taken for this visit.  Physical Exam Vitals signs and nursing note reviewed.  Constitutional:      General: She is not in acute distress.    Appearance: She is well-developed.  HENT:     Head: Normocephalic and atraumatic.     Mouth/Throat:     Pharynx: No oropharyngeal exudate.  Eyes:     General: No scleral icterus.       Right eye: No discharge.        Left eye: No discharge.     Conjunctiva/sclera: Conjunctivae normal.     Pupils: Pupils are equal, round, and reactive to light.  Neck:     Musculoskeletal: Normal range of motion and neck supple.     Thyroid: No thyromegaly.     Vascular: No JVD.  Cardiovascular:     Rate and Rhythm: Regular rhythm. Tachycardia present.     Heart sounds: Normal heart sounds. No murmur. No friction rub. No gallop.      Comments: Mild tachycardia present, normal pulses, no edema Pulmonary:     Effort: Pulmonary effort is normal. No respiratory distress.     Breath sounds: Normal breath sounds. No wheezing or rales.  Abdominal:     General: Bowel sounds are normal. There is no distension.     Palpations: Abdomen is soft. There is no  mass.     Tenderness: There is abdominal tenderness.     Comments: Mild tenderness to the mid abdomen and suprapubic region, no right upper quadrant or right lower quadrant tenderness, soft and non-peritoneal  Musculoskeletal: Normal range of motion.        General: No tenderness.  Lymphadenopathy:     Cervical: No cervical adenopathy.  Skin:    General: Skin is warm and dry.     Findings: No erythema or rash.  Neurological:     Mental Status: She is alert.     Coordination: Coordination normal.  Psychiatric:        Behavior: Behavior normal.      ED Treatments / Results  Labs (all labs ordered are listed, but only abnormal results are displayed) Labs Reviewed  CBC WITH DIFFERENTIAL/PLATELET  PROTIME-INR  BASIC METABOLIC PANEL  TYPE AND SCREEN    EKG None  Radiology No results found.  Procedures Procedures (including critical care time)  Medications Ordered in ED Medications - No data to display   Initial Impression / Assessment and Plan / ED Course  I have reviewed the triage vital signs and the nursing notes.  Pertinent labs & imaging results that were available during my care of the patient were reviewed by me and considered in my medical decision making (see chart for details).  Clinical Course as of Jun 28 2209  Sat Jun 29, 2019  2209 Urinalysis reveals no infection, blood found likely contaminant.  Wet prep shows white blood cells, no signs of infection.  At this time the patient does appear stable for discharge, she is no longer tachycardic on my exam   [  BM]    Clinical Course User Index [BM] Noemi Chapel, MD       The patient appears anxious, she has reported heavy vaginal bleeding, vaginal exam shows with chaperone present that the patient has a normal-appearing vaginal vault, there is no blood, no discharge, cervical office is closed, no tenderness.  Need CBC, type and screen, potentially consult with gynecology versus stopping bleeding with  other medications.  Has no hypotension, no fever, rule out ectopic as well  Patient offered STD testing, sample sent, wet prep sent, urinalysis sent, she is well-appearing with no anemia and not pregnant.  Final Clinical Impressions(s) / ED Diagnoses   Final diagnoses:  Vaginal bleeding      Noemi Chapel, MD 06/29/19 2210

## 2019-06-30 LAB — ABO/RH: ABO/RH(D): O POS

## 2019-07-01 ENCOUNTER — Ambulatory Visit: Payer: Managed Care, Other (non HMO) | Admitting: Cardiology

## 2019-07-02 LAB — GC/CHLAMYDIA PROBE AMP (~~LOC~~) NOT AT ARMC
Chlamydia: NEGATIVE
Neisseria Gonorrhea: NEGATIVE

## 2019-07-10 ENCOUNTER — Telehealth: Payer: Self-pay | Admitting: Family Medicine

## 2019-07-10 ENCOUNTER — Ambulatory Visit: Payer: Self-pay

## 2019-07-10 NOTE — Telephone Encounter (Signed)
Pt. Wanted to talk "to someone in the practice." Attempted to transfer and pt. Hung up.

## 2019-07-10 NOTE — Telephone Encounter (Signed)
LVM to call office back about concerns.  

## 2019-07-10 NOTE — Telephone Encounter (Signed)
Patient says she may have UTI did NOT want to make appnt wants someone to call her immediately [phone]

## 2019-07-11 ENCOUNTER — Ambulatory Visit (INDEPENDENT_AMBULATORY_CARE_PROVIDER_SITE_OTHER): Payer: Managed Care, Other (non HMO)

## 2019-07-11 ENCOUNTER — Encounter: Payer: Self-pay | Admitting: Family Medicine

## 2019-07-11 ENCOUNTER — Ambulatory Visit (INDEPENDENT_AMBULATORY_CARE_PROVIDER_SITE_OTHER): Payer: Managed Care, Other (non HMO) | Admitting: Family Medicine

## 2019-07-11 ENCOUNTER — Telehealth: Payer: Self-pay | Admitting: Internal Medicine

## 2019-07-11 ENCOUNTER — Other Ambulatory Visit: Payer: Self-pay

## 2019-07-11 VITALS — BP 159/95 | HR 71 | Temp 98.9°F | Resp 17 | Ht 61.0 in | Wt 165.4 lb

## 2019-07-11 DIAGNOSIS — R3129 Other microscopic hematuria: Secondary | ICD-10-CM

## 2019-07-11 DIAGNOSIS — R399 Unspecified symptoms and signs involving the genitourinary system: Secondary | ICD-10-CM

## 2019-07-11 DIAGNOSIS — R1030 Lower abdominal pain, unspecified: Secondary | ICD-10-CM

## 2019-07-11 DIAGNOSIS — Z23 Encounter for immunization: Secondary | ICD-10-CM | POA: Diagnosis not present

## 2019-07-11 DIAGNOSIS — R102 Pelvic and perineal pain: Secondary | ICD-10-CM

## 2019-07-11 LAB — POCT URINALYSIS DIP (MANUAL ENTRY)
Bilirubin, UA: NEGATIVE
Glucose, UA: NEGATIVE mg/dL
Ketones, POC UA: NEGATIVE mg/dL
Leukocytes, UA: NEGATIVE
Nitrite, UA: NEGATIVE
Protein Ur, POC: NEGATIVE mg/dL
Spec Grav, UA: 1.015 (ref 1.010–1.025)
Urobilinogen, UA: 0.2 E.U./dL
pH, UA: 7 (ref 5.0–8.0)

## 2019-07-11 MED ORDER — TRAMADOL HCL 50 MG PO TABS: 50.0000 mg | ORAL_TABLET | Freq: Four times a day (QID) | ORAL | 0 refills | Status: AC | PRN

## 2019-07-11 NOTE — Progress Notes (Signed)
Patient ID: Brooke Turner, female    DOB: 06-08-1976  Age: 43 y.o. MRN: [id number]  Chief Complaint  Patient presents with  . UTI SYMPTOMS    seen 8/21 by santiago for this issue but negative, cramps throughout the last 3 days, yesterday while trying to go, it hurt to go (pain in pelvic area) ? stones or if its her fibroids  . Medication Refill    lorazepam     Subjective:   Patient is here with a complaint of pelvic pain.  This is been a chronic problem for her, but is been worse now.  She just hurts across the lower abdomen and in the left side of the abdomen.  She has been seen here.  She has been seen by her gynecologist.  She has a history of uterine fibroids but has not had her annual pelvic ultrasound yet.  She wondered whether she could have kidney stones or urinary tract infection.  No fever chills.  No nausea or vomiting.  Does feel bloated a lot and complains of having a lot of gas.  Current allergies, medications, problem list, past/family and social histories reviewed.  Objective:  BP (!) 159/95 (BP Location: Right Arm, Patient Position: Sitting, Cuff Size: Normal)   Pulse 71   Temp 98.9 F (37.2 C) (Oral)   Resp 17   Ht 5\' 1"  (1.549 m)   Wt 165 lb 6.4 oz (75 kg)   LMP 03/07/2018 Comment: pt has fibroids, breakthru bleeding now, takes bc continuously to stop menses  SpO2 99%   BMI 31.25 kg/m   No major acute distress.  Chest clear.  Heart regular.  Abdomen has bowel sounds present.  Soft without organomegaly or masses.  Tender across the low abdomen and pelvic region.  Pelvic exam had been done just recently and I did not repeat that today.  X-rays were done of the abdomen and I reviewed those and do not see anything other than a nonspecific stool gas pattern, but I am awaiting the radiology reading still.  Assessment & Plan:   Assessment: 1. UTI symptoms   2. Need for prophylactic vaccination and inoculation against influenza   3. Lower abdominal pain   4.  Other microscopic hematuria   5. Pelvic pain       Plan: Await the radiology reading hopefully before she goes out today.  Consider getting a pelvic ultrasound done in radiology.  The radiologist might interpret things a little differently or look at a little more of the structures outside of the uterus and ovaries than a gynecologist does.  CT scan was done last year and repeat of that is an option if problems continue to persist.  She does not want a CT done.  5: 50   PM radiology reading is now completed and does not show anything.  Orders Placed This Encounter  Procedures  . DG Abd 2 Views    Standing Status:   Future    Number of Occurrences:   1    Standing Expiration Date:   09/09/2020    Order Specific Question:   Reason for Exam (SYMPTOM  OR DIAGNOSIS REQUIRED)    Answer:   low abdominal pain    Order Specific Question:   Is patient pregnant?    Answer:   No    Order Specific Question:   Preferred imaging location?    Answer:   External    Order Specific Question:   Radiology Contrast Protocol - do NOT  remove file path    Answer:   [email or web address]  . US Pelvis Complete    Standing Status:   Future    Standing Expiration Date:   09/09/2020    Order Specific Question:   Reason for Exam (SYMPTOM  OR DIAGNOSIS REQUIRED)    Answer:   uterine fibroids, microscopic hematuria, pelvic pain    Order Specific Question:   Preferred imaging location?    Answer:   External  . POCT urinalysis dipstick    Meds ordered this encounter  Medications  . traMADol (ULTRAM) 50 MG tablet    Sig: Take 1 tablet (50 mg total) by mouth every 6 (six) hours as needed for up to 5 days.    Dispense:  24 tablet    Refill:  0         Patient Instructions    Take tramadol every 6 or 8 hours if needed for pelvic pain.  This can be taken with a Tylenol for increased efficacy.  Consider trying Gas-X but it is similar to some of the things you have  tried for bowel gas.  We are trying to schedule you for a pelvic ultrasound, and the office is closed so it will have to be scheduled tomorrow morning.  I will have them leave a note for the morning staff to try and work on that first thing.  Keep your appointment with your gynecologist next week  In the event of acutely worsening pain go to an emergency room.  If you have lab work done today you will be contacted with your lab results within the next 2 weeks.  If you have not heard from [place] then please contact us. The fastest way to get your results is to register for My Chart.   IF you received an x-ray today, you will receive an invoice from [facility] Radiology. Please contact [facility] Radiology at [phone] with questions or concerns regarding your invoice.   IF you received labwork today, you will receive an invoice from [facility]. Please contact [facility] at [phone] with questions or concerns regarding your invoice.   Our billing staff will not be able to assist you with questions regarding bills from these companies.  You will be contacted with the lab results as soon as they are available. The fastest way to get your results is to activate your My Chart account. Instructions are located on the last page of this paperwork. If you have not heard from [place] regarding the results in 2 weeks, please contact this office.        No follow-ups on file.   Ruben Reason, MD 07/11/2019

## 2019-07-11 NOTE — Telephone Encounter (Signed)
Her sleep study was normal. There was no sleep apnea, no significant snoring. Oxygen and heart rhythm were normal. I don't have anything more to offer her.

## 2019-07-11 NOTE — Telephone Encounter (Signed)
Call returned to patient, requesting results of sleep study.   CY please advise of sleep study results. Thanks.

## 2019-07-11 NOTE — Patient Instructions (Addendum)
  Take tramadol every 6 or 8 hours if needed for pelvic pain.  This can be taken with a Tylenol for increased efficacy.  Consider trying Gas-X but it is similar to some of the things you have tried for bowel gas.  We are trying to schedule you for a pelvic ultrasound, and the office is closed so it will have to be scheduled tomorrow morning.  I will have them leave a note for the morning staff to try and work on that first thing.  Keep your appointment with your gynecologist next week  In the event of acutely worsening pain go to an emergency room.  If you have lab work done today you will be contacted with your lab results within the next 2 weeks.  If you have not heard from [place] then please contact us. The fastest way to get your results is to register for My Chart.   IF you received an x-ray today, you will receive an invoice from [facility] Radiology. Please contact [facility] Radiology at [phone] with questions or concerns regarding your invoice.   IF you received labwork today, you will receive an invoice from [facility]. Please contact [facility] at [phone] with questions or concerns regarding your invoice.   Our billing staff will not be able to assist you with questions regarding bills from these companies.  You will be contacted with the lab results as soon as they are available. The fastest way to get your results is to activate your My Chart account. Instructions are located on the last page of this paperwork. If you have not heard from [place] regarding the results in 2 weeks, please contact this office.

## 2019-07-12 NOTE — Telephone Encounter (Signed)
LMTCB x1 for pt.  

## 2019-07-12 NOTE — Telephone Encounter (Signed)
She was seen in the office

## 2019-07-16 NOTE — Telephone Encounter (Signed)
Left message for patient to call back  

## 2019-07-17 ENCOUNTER — Other Ambulatory Visit: Payer: Self-pay | Admitting: Family Medicine

## 2019-07-17 DIAGNOSIS — Z1231 Encounter for screening mammogram for malignant neoplasm of breast: Secondary | ICD-10-CM

## 2019-07-17 NOTE — Telephone Encounter (Signed)
ATC pt, line went to voicemail. I have left a detailed message with CY's results and recommendations per pt's DPR and let her know to give our office a call back if she has any more questions or concerns. Nothing further needed at this time.

## 2019-07-18 ENCOUNTER — Other Ambulatory Visit: Payer: Managed Care, Other (non HMO)

## 2019-07-18 ENCOUNTER — Telehealth: Payer: Self-pay | Admitting: Internal Medicine

## 2019-07-18 ENCOUNTER — Ambulatory Visit
Admission: RE | Admit: 2019-07-18 | Discharge: 2019-07-18 | Disposition: A | Discharge status: Home / Self Care | Payer: Managed Care, Other (non HMO) | Source: Ambulatory Visit | Attending: Family Medicine | Admitting: Family Medicine

## 2019-07-18 DIAGNOSIS — R1030 Lower abdominal pain, unspecified: Secondary | ICD-10-CM

## 2019-07-18 DIAGNOSIS — R102 Pelvic and perineal pain: Secondary | ICD-10-CM

## 2019-07-18 NOTE — Telephone Encounter (Signed)
Returned call to patient. She states she did receive a voicemail regarding sleep test results but she had some questions and felt she needed a little more explanation.  Reviewed with her Dr. Janee Morn notes regarding sleep study results.   She would like clarification on: She states the 'tech' during the sleep test told her after the test that she did have some episodes of apnea but Dr. Annamaria Boots says no apnea.   She also states she 'hardly slept at all' due to the tech waking her up several times to 'adjust the wires.' So she wonders how accurate the test was overall.   She wants Dr. Annamaria Boots to know she respects his opinion just hoped he could put her at ease about these things.    She is willing to do a televisit to discuss if Dr. Annamaria Boots prefers that or we can call her tomorrow after his review.    Routed to Dr. Annamaria Boots for review and advice.

## 2019-07-19 ENCOUNTER — Telehealth: Payer: Self-pay | Admitting: Family Medicine

## 2019-07-19 NOTE — Telephone Encounter (Signed)
ATC pt, line went to voicemail. I have left a detailed message regarding CY's response below per pt's DPR. I let her know if she has any additional questions or concerns to give our office a call back. Nothing further needed at this time.

## 2019-07-19 NOTE — Telephone Encounter (Signed)
This sleep study did not record any apneas on final review and interpretation. Normal adults may average as many as 5 apneas per hour on a given night, without any medical consequences, so that is considered the upper limits of normal. A diagnosis of obstructive sleep apnea syndrome as a medical condition is based on finding more than 5 per hour. On this study , by her brain waves, she was asleep 170 minutes.  That doesn't indicate a diagnosable sleep disorder in the sleep lab environment.

## 2019-07-19 NOTE — Telephone Encounter (Signed)
Pt states Dr.Hopper has called her 4 times today. Pt is concerned .

## 2019-07-22 ENCOUNTER — Telehealth: Payer: Self-pay | Admitting: Family Medicine

## 2019-07-22 NOTE — Telephone Encounter (Signed)
Pt called to confirm Lorazepam 3 month supply is being sent to the pharmacy. She states the pharmacy has not received a prescription.  Pt also wants a cb to ask questions about the flu shot vaccine. She is expecting a call today

## 2019-07-25 ENCOUNTER — Ambulatory Visit (INDEPENDENT_AMBULATORY_CARE_PROVIDER_SITE_OTHER): Payer: Managed Care, Other (non HMO) | Admitting: Family Medicine

## 2019-07-25 ENCOUNTER — Ambulatory Visit: Payer: Self-pay

## 2019-07-25 ENCOUNTER — Other Ambulatory Visit: Payer: Self-pay

## 2019-07-25 DIAGNOSIS — Z23 Encounter for immunization: Secondary | ICD-10-CM

## 2019-07-25 NOTE — Telephone Encounter (Signed)
Summary: flu vaccine questions    Patient requesting call back to discuss flu vaccine. Patient just has general questions about potential side effects and what she should/should not do.

## 2019-07-26 ENCOUNTER — Telehealth: Payer: Self-pay | Admitting: Family Medicine

## 2019-07-26 ENCOUNTER — Other Ambulatory Visit: Payer: Self-pay

## 2019-07-26 NOTE — Telephone Encounter (Signed)
Please advise. Dgaddy, CMA 

## 2019-07-26 NOTE — Telephone Encounter (Signed)
Patient called to have a referral put in for her wrist.  Just started getting pain and swelling left arm, wrist   She would like a call back Immediately  [phone]

## 2019-07-29 ENCOUNTER — Other Ambulatory Visit: Payer: Self-pay

## 2019-07-29 ENCOUNTER — Ambulatory Visit
Admission: RE | Admit: 2019-07-29 | Discharge: 2019-07-29 | Disposition: A | Discharge status: Home / Self Care | Payer: Managed Care, Other (non HMO) | Source: Ambulatory Visit | Attending: Family Medicine | Admitting: Family Medicine

## 2019-07-29 DIAGNOSIS — Z1231 Encounter for screening mammogram for malignant neoplasm of breast: Secondary | ICD-10-CM

## 2019-07-31 NOTE — Telephone Encounter (Signed)
Pt checking on status of this message.  °

## 2019-07-31 NOTE — Telephone Encounter (Signed)
Pt checking on status of this message. She also brought up a referral request for her veins in the back of her legs.

## 2019-07-31 NOTE — Telephone Encounter (Signed)
Is it ok to put in referral to ortho?

## 2019-08-02 ENCOUNTER — Ambulatory Visit: Payer: Managed Care, Other (non HMO)

## 2019-08-02 ENCOUNTER — Other Ambulatory Visit: Payer: Self-pay | Admitting: Family Medicine

## 2019-08-02 DIAGNOSIS — I83812 Varicose veins of left lower extremities with pain: Secondary | ICD-10-CM

## 2019-08-02 NOTE — Telephone Encounter (Signed)
Please notify patient that referral to vein specialist is done.. Otherwise I advise she see her PCP, Dr Nolon Rod, to discuss her wrist pain. In the meanwhile, advise use of brace, rest, elevation and ice. thanks

## 2019-08-02 NOTE — Telephone Encounter (Signed)
Pt states that Dr. Pamella Pert is the one that mentioned going to a vein specialist. So she would like Dr. Pamella Pert to follow up  with this request. Pt is requesting a call back when this is done. Phone number on file is correct.

## 2019-08-06 NOTE — Telephone Encounter (Signed)
Pt would like to know if Dr. Nolon Rod could work on he wrist referral. Please advise pt at [phone]

## 2019-08-07 NOTE — Telephone Encounter (Signed)
I called patient to set up an appt per dr. Nolon Rod in regards to receiving some [employer] paperwork for [employer]. She said that she had received something from [employer] in the mail that says she was already approved for her FMLA. So she was going to call Shawna Orleans and then let us know if we could disregard that paperwork. Still waiting to hear back.

## 2019-08-07 NOTE — Telephone Encounter (Signed)
Noted. Dgaddy, CMA 

## 2019-08-13 ENCOUNTER — Telehealth: Payer: Self-pay | Admitting: Family Medicine

## 2019-08-13 NOTE — Telephone Encounter (Signed)
Pt would like a refill on herLORazepam (ATIVAN) 0.5 MG tablet [id number]. Please advise at [phone]. I tried to transfer her for her concern and she hung up the phone.

## 2019-08-13 NOTE — Telephone Encounter (Signed)
Please advise 

## 2019-08-16 ENCOUNTER — Emergency Department (HOSPITAL_COMMUNITY)
Admission: EM | Admit: 2019-08-16 | Discharge: 2019-08-16 | Disposition: A | Discharge status: Home / Self Care | Payer: Managed Care, Other (non HMO) | Attending: Emergency Medicine | Admitting: Emergency Medicine

## 2019-08-16 ENCOUNTER — Emergency Department (HOSPITAL_BASED_OUTPATIENT_CLINIC_OR_DEPARTMENT_OTHER): Payer: Managed Care, Other (non HMO)

## 2019-08-16 ENCOUNTER — Other Ambulatory Visit: Payer: Self-pay

## 2019-08-16 ENCOUNTER — Encounter (HOSPITAL_COMMUNITY): Payer: Self-pay | Admitting: Emergency Medicine

## 2019-08-16 ENCOUNTER — Emergency Department (HOSPITAL_COMMUNITY): Payer: Managed Care, Other (non HMO)

## 2019-08-16 DIAGNOSIS — Z79899 Other long term (current) drug therapy: Secondary | ICD-10-CM | POA: Diagnosis not present

## 2019-08-16 DIAGNOSIS — K219 Gastro-esophageal reflux disease without esophagitis: Secondary | ICD-10-CM | POA: Insufficient documentation

## 2019-08-16 DIAGNOSIS — Z87891 Personal history of nicotine dependence: Secondary | ICD-10-CM | POA: Diagnosis not present

## 2019-08-16 DIAGNOSIS — I10 Essential (primary) hypertension: Secondary | ICD-10-CM | POA: Diagnosis not present

## 2019-08-16 DIAGNOSIS — M79609 Pain in unspecified limb: Secondary | ICD-10-CM

## 2019-08-16 DIAGNOSIS — R072 Precordial pain: Secondary | ICD-10-CM | POA: Diagnosis not present

## 2019-08-16 DIAGNOSIS — R0789 Other chest pain: Secondary | ICD-10-CM | POA: Diagnosis present

## 2019-08-16 DIAGNOSIS — R0981 Nasal congestion: Secondary | ICD-10-CM | POA: Insufficient documentation

## 2019-08-16 LAB — BASIC METABOLIC PANEL
Anion gap: 11 (ref 5–15)
BUN: 9 mg/dL (ref 6–20)
CO2: 25 mmol/L (ref 22–32)
Calcium: 8.8 mg/dL — ABNORMAL LOW (ref 8.9–10.3)
Chloride: 102 mmol/L (ref 98–111)
Creatinine, Ser: 1.07 mg/dL — ABNORMAL HIGH (ref 0.44–1.00)
GFR calc Af Amer: >60 mL/min (ref >60)
GFR calc non Af Amer: >60 mL/min (ref >60)
Glucose, Bld: 135 mg/dL — ABNORMAL HIGH (ref 70–99)
Potassium: 3.3 mmol/L — ABNORMAL LOW (ref 3.5–5.1)
Sodium: 138 mmol/L (ref 135–145)

## 2019-08-16 LAB — CBC
HCT: 39 % (ref 36.0–46.0)
Hemoglobin: 13.1 g/dL (ref 12.0–15.0)
MCH: 30.5 pg (ref 26.0–34.0)
MCHC: 33.6 g/dL (ref 30.0–36.0)
MCV: 90.9 fL (ref 80.0–100.0)
Platelets: 256 10*3/uL (ref 150–400)
RBC: 4.29 MIL/uL (ref 3.87–5.11)
RDW: 12.3 % (ref 11.5–15.5)
WBC: 6.6 10*3/uL (ref 4.0–10.5)
nRBC: 0 % (ref 0.0–0.2)

## 2019-08-16 LAB — D-DIMER, QUANTITATIVE: D-Dimer, Quant: 0.4 ug/mL-FEU (ref 0.00–0.50)

## 2019-08-16 LAB — TROPONIN I (HIGH SENSITIVITY)
Troponin I (High Sensitivity): 3 ng/L (ref <18)
Troponin I (High Sensitivity): 3 ng/L (ref <18)

## 2019-08-16 LAB — I-STAT BETA HCG BLOOD, ED (MC, WL, AP ONLY): I-stat hCG, quantitative: <5 m[IU]/mL (ref <5)

## 2019-08-16 MED ORDER — KETOROLAC TROMETHAMINE 30 MG/ML IJ SOLN
30.0000 mg | Freq: Once | INTRAMUSCULAR | Status: AC
  Administered 2019-08-16: 30 mg via INTRAVENOUS
  Filled 2019-08-16: qty 1

## 2019-08-16 MED ORDER — SODIUM CHLORIDE 0.9% FLUSH
3.0000 mL | Freq: Once | INTRAVENOUS | Status: AC
  Administered 2019-08-16: 3 mL via INTRAVENOUS

## 2019-08-16 MED ORDER — LIDOCAINE VISCOUS HCL 2 % MT SOLN
15.0000 mL | Freq: Once | OROMUCOSAL | Status: AC
  Administered 2019-08-16: 15 mL via ORAL
  Filled 2019-08-16: qty 15

## 2019-08-16 MED ORDER — ALUM & MAG HYDROXIDE-SIMETH 200-200-20 MG/5ML PO SUSP
30.0000 mL | Freq: Once | ORAL | Status: AC
  Administered 2019-08-16: 30 mL via ORAL
  Filled 2019-08-16: qty 30

## 2019-08-16 NOTE — ED Notes (Signed)
[place] completed with exam and reports off negative for DVT.

## 2019-08-16 NOTE — Discharge Instructions (Signed)
You may follow-up with cardiology and your GI doctor for your recurrent symptoms.  Return for any new or worsening symptoms.

## 2019-08-16 NOTE — ED Notes (Signed)
Pt very anxious about every procedure and medication given.  Has multiple questions and able to console.  She is pleasant and jovial about her anxiety.   Denies need for IV pain meds at this time.  Warm blankets applied, POC reviewed.

## 2019-08-16 NOTE — ED Provider Notes (Signed)
[facility] EMERGENCY DEPARTMENT Provider Note   CSN: [id number] Arrival date & time: 08/16/19  0421     History   Chief Complaint Chief Complaint  Patient presents with   Chest Pain    HPI Brooke Turner is a 43 y.o. female with past medical history significant for chronic chest pain, hypertension, GERD, palpitations, PTSD who presents for evaluation of chest pain.  Patient states she has had intermittent chest pain over the last 3 days.  Patient states she did recently drive back from [place].  Patient states her pain originally begins as a burning sensation which she develops and it radiates into the left side of her chest.  Patient states she however does have pleuritic chest pain.  States she has had a cough productive of clear sputum as well as congestion, rhinorrhea.  She is unsure of any COVID exposures.  She denies any shortness of breath, exertional chest pain, abdominal pain, radiation to back, left arm or jaw, diaphoresis, syncope, unilateral weakness.  States she has been taking her Ativan as normal which she was prescribed by her PCP.  Patient states she has had palpitations however this is "normal."  Has taken Tylenol 2 days ago once without relief of her symptoms.  She states she has noticed some tenderness to her left lower extremity since she got back from her trip to [place].  Denies prior history of PE or DVTs.  Patient states she is on oral contraceptive pills.  Denies additional aggravating or alleviating factors.  She rates her current pain a 5/10.  History obtained from patient and past medical records.  No interpreter was used.     HPI  Past Medical History:  Diagnosis Date   Anxiety    Back pain    Chest pain    a. 09/2014 Echo: EF 60-65%, no rwma, nl valves;  b. 12/2014 Neg ETT.   Essential hypertension    a. 01/2016 Renal duplex: no RAS- ? renal cyst (renal u/s 4/17 - no cyst, nl study); b. 02/2016 24 hr BP Monitor:  Mean SBP ~ 138 with Max of 198. Highest pressures noted in evening hours following placement of cuff.   Fibroid uterus    GERD (gastroesophageal reflux disease)    Palpitations    a. 09/2014 nl event monitor.    Patient Active Problem List   Diagnosis Date Noted   OSA (obstructive sleep apnea) 06/11/2019   Encounter for examination following treatment at hospital 04/25/2019   Hypokalemia 04/25/2019   Elevated glucose 04/25/2019   History of posttraumatic stress disorder (PTSD) 12/31/2018   History of panic attacks 12/31/2018   Palpitations    Chest pain of uncertain etiology    Chronic tension-type headache, not intractable 06/10/2015   Dyspnea 03/18/2015   Post-traumatic stress reaction 02/25/2015   Anxiety disorder due to general medical condition with panic attack 02/25/2015   Subserous leiomyoma of uterus 02/25/2015   Anxiety about health 02/25/2015   Deviated nasal septum 12/30/2014   Chronic rhinitis 12/30/2014   Chronic maxillary sinusitis 12/30/2014   Snorings 09/30/2014   Prediabetes 08/26/2014   Acanthosis nigricans 03/19/2014   Vitamin D deficiency 07/15/2013   Large breasts 09/12/2012   Essential hypertension 02/17/2012   Obesity (BMI 30-39.9) previous BMI was over 40. Patient has lost 70 pounds in the last year 02/17/2012   Anxiety disorder 02/17/2012   Chronic back pain 02/17/2012    Past Surgical History:  Procedure Laterality Date  WISDOM TOOTH EXTRACTION       OB History    Gravida  2   Para  0   Term  0   Preterm  0   AB  2   Living  0     SAB  1   TAB  1   Ectopic  0   Multiple  0   Live Births               Home Medications    Prior to Admission medications   Medication Sig Start Date End Date Taking? Authorizing Provider  acetaminophen (TYLENOL) 500 MG tablet Take 500 mg by mouth every 6 (six) hours as needed (back pain/ cramps).     [provider]  albuterol (PROVENTIL  HFA;VENTOLIN HFA) 108 (90 Base) MCG/ACT inhaler Inhale 1-2 puffs into the lungs every 6 (six) hours as needed for wheezing or shortness of breath. 02/07/19   Alveria Apley, PA-C  ALPRAZolam (NIRAVAM) 0.25 MG dissolvable tablet Take 0.25 mg by mouth at bedtime as needed for anxiety.    [provider]  atenolol (TENORMIN) 25 MG tablet Take 1/4 tablet every morning 04/25/19   Alvstad, Kristin L, RPH-CPP  blood glucose meter kit and supplies Test blood sugar twice daily. Dx code: R73.09 04/25/19   Maryruth Hancock, MD  busPIRone (BUSPAR) 10 MG tablet Take 10 mg by mouth daily.    [provider]  Cyanocobalamin (VITAMIN B-12 PO) Take 1 tablet by mouth daily.    [provider]  famotidine (PEPCID) 20 MG tablet Take 1 tablet (20 mg total) by mouth 2 (two) times daily. 11/20/18   [name], MD  fluticasone (FLONASE) 50 MCG/ACT nasal spray Place 2 sprays into both nostrils daily. Patient taking differently: Place 2 sprays into both nostrils daily as needed (seasonal allergies).  10/10/17   Tenna Delaine D, PA-C  glucose blood [facility] VERIO) test strip TEST BLOOD SUGAR DAILY 11/16/18   [name], MD  levalbuterol [facility] HFA) 45 MCG/ACT inhaler Inhale 1-2 puffs every 6 hours if needed- rescue inhaler 05/29/19   Deneise Lever, MD  LORazepam (ATIVAN) 0.5 MG tablet Take 1 tablet (0.5 mg total) by mouth every 6 (six) hours as needed for anxiety. Patient taking differently: Take 0.25 mg by mouth 3 (three) times daily as needed for anxiety.  03/19/19   Forrest Moron, MD  Multiple Vitamins-Minerals (MULTIVITAMIN GUMMIES ADULT PO) Take 2 tablets by mouth daily.     [provider]  Norethindrone-Ethinyl Estradiol-Fe Biphas (LO LOESTRIN FE) 1 MG-10 MCG / 10 MCG tablet Take 1 tablet by mouth at bedtime.     [provider]  Jonetta Speak LANCETS 50N MISC USE TO TEST BLOOD SUGAR DAILY 11/16/18   [name], MD  pantoprazole (PROTONIX) 40 MG tablet TAKE 1 TABLET BY  MOUTH ONCE DAILY BEFORE BREAKFAST 06/21/19   Thornton Park, MD  SPIRIVA RESPIMAT 1.25 MCG/ACT AERS Inhale 1.25 mcg into the lungs daily. 05/13/19   [provider]    Family History Family History  Problem Relation Age of Onset   Seizures Mother    Epilepsy Mother    Cancer Father        Liver   Diabetes Father 34   Hypertension Father    Heart disease Father 3       CEA, LE Stenting   Alzheimer's disease Maternal Grandmother    Heart disease Maternal Grandfather    Colon cancer Neg  Hx    Breast cancer Neg Hx     Social History Social History   Tobacco Use   Smoking status: Former Smoker    Packs/day: 0.25    Years: 10.00    Pack years: 2.50    Types: Cigarettes    Start date: 06/25/2000    Quit date: 08/19/2014    Years since quitting: 4.9   Smokeless tobacco: Never Used  Substance Use Topics   Alcohol use: No    Alcohol/week: 0.0 standard drinks   Drug use: No     Allergies   Penicillins, Tamiflu [oseltamivir phosphate], Tinidazole, and Diltiazem   Review of Systems Review of Systems  Constitutional: Negative.   HENT: Positive for congestion, postnasal drip and rhinorrhea. Negative for ear discharge, ear pain, facial swelling, nosebleeds, sinus pressure, sinus pain, sneezing, sore throat and voice change.   Eyes: Negative.   Respiratory: Positive for cough. Negative for apnea, choking, chest tightness, shortness of breath, wheezing and stridor.   Cardiovascular: Positive for chest pain and palpitations (Chronic).  Gastrointestinal: Negative.  Negative for nausea and vomiting.  Genitourinary: Negative.   Musculoskeletal: Negative.   Skin: Negative.   Neurological: Negative.   All other systems reviewed and are negative.    Physical Exam Updated Vital Signs   Physical Exam Vitals signs and nursing note reviewed.  Constitutional:      General: She is not in acute distress.    Appearance: She is well-developed. She is not  ill-appearing, toxic-appearing or diaphoretic.  HENT:     Head: Normocephalic and atraumatic.     Jaw: There is normal jaw occlusion.  Eyes:     Pupils: Pupils are equal, round, and reactive to light.  Neck:     Musculoskeletal: Full passive range of motion without pain, normal range of motion and neck supple.     Vascular: No carotid bruit or JVD.     Trachea: Trachea and phonation normal.  Cardiovascular:     Rate and Rhythm: Normal rate.     Pulses: Normal pulses.          Radial pulses are 2+ on the right side and 2+ on the left side.       Dorsalis pedis pulses are 2+ on the right side and 2+ on the left side.       Posterior tibial pulses are 2+ on the right side and 2+ on the left side.     Heart sounds: Normal heart sounds.  Pulmonary:     Effort: Pulmonary effort is normal. No respiratory distress.     Breath sounds: Normal breath sounds and air entry.     Comments: Clear to auscultation bilateral without wheeze, rhonchi or rales.  Speaks in full sentences without difficulty. Abdominal:     General: There is no distension.     Comments: Soft, nontender without rebound or guarding.  Musculoskeletal: Normal range of motion.     Right ankle: Normal.     Left ankle: Normal.     Right lower leg: Normal.     Comments: Moves all 4 extremities without difficulty.  Very minimal tenderness over posterior left calf.  No swelling, erythema, warmth.  Homans sign negative  Skin:    General: Skin is warm and dry.     Comments: No edema, erythema, ecchymosis or warmth.  Brisk capillary refill.  No rashes or lesions.  Neurological:     General: No focal deficit present.     Mental Status: She is  alert.     Comments: Cranial nerves nerves II through XII grossly intact.  Ambulatory that difficulty.  No facial droop.  Phonation normal.      ED Treatments / Results  Labs (all labs ordered are listed, but only abnormal results are displayed) Labs Reviewed  BASIC METABOLIC PANEL -  Abnormal; Notable for the following components:      Result Value   Potassium 3.3 (*)    Glucose, Bld 135 (*)    Creatinine, Ser 1.07 (*)    Calcium 8.8 (*)    All other components within normal limits  CBC  D-DIMER, QUANTITATIVE (NOT AT [facility])  I-STAT BETA HCG BLOOD, ED ([facility], [facility], [facility] ONLY)  TROPONIN I (HIGH SENSITIVITY)  TROPONIN I (HIGH SENSITIVITY)    EKG EKG Interpretation  Date/Time:  Friday August 16 2019 04:50:13 EDT Ventricular Rate:  73 PR Interval:  176 QRS Duration: 84 QT Interval:  390 QTC Calculation: 429 R Axis:   60 Text Interpretation:  Normal sinus rhythm Normal ECG When compared with ECG of 05/22/2019, No significant change was found Confirmed by Delora Fuel ([id number]) on 08/16/2019 6:01:55 AM   Radiology Dg Chest 2 View  Result Date: 08/16/2019 CLINICAL DATA:  Chest pain EXAM: CHEST - 2 VIEW COMPARISON:  05/22/2019 FINDINGS: Normal heart size and mediastinal contours. No acute infiltrate or edema. No effusion or pneumothorax. No acute osseous findings. IMPRESSION: Negative chest Electronically Signed   By: Monte Fantasia M.D.   On: 08/16/2019 05:37   Vas [place] Lower Extremity Venous (dvt) (only [facility])  Result Date: 08/16/2019  Lower Venous Study Other Indications: Pain in left leg. Comparison Study: Negative LLEV study on 1186/19. Performing Technologist: Oda Cogan RDMS, RVT  Examination Guidelines: A complete evaluation includes B-mode imaging, spectral Doppler, color Doppler, and power Doppler as needed of all accessible portions of each vessel. Bilateral testing is considered an integral part of a complete examination. Limited examinations for reoccurring indications may be performed as noted.  +-----+---------------+---------+-----------+----------+--------------+  RIGHT Compressibility Phasicity Spontaneity Properties Thrombus Aging  +-----+---------------+---------+-----------+----------+--------------+  CFV   Full            Yes       Yes                                     +-----+---------------+---------+-----------+----------+--------------+  SFJ   Full                                                             +-----+---------------+---------+-----------+----------+--------------+   +---------+---------------+---------+-----------+----------+--------------+  LEFT      Compressibility Phasicity Spontaneity Properties Thrombus Aging  +---------+---------------+---------+-----------+----------+--------------+  CFV       Full            Yes       Yes                                    +---------+---------------+---------+-----------+----------+--------------+  SFJ       Full                                                             +---------+---------------+---------+-----------+----------+--------------+  FV Prox   Full                                                             +---------+---------------+---------+-----------+----------+--------------+  FV Mid    Full                                                             +---------+---------------+---------+-----------+----------+--------------+  FV Distal Full                                                             +---------+---------------+---------+-----------+----------+--------------+  PFV       Full                                                             +---------+---------------+---------+-----------+----------+--------------+  POP       Full            Yes       Yes                                    +---------+---------------+---------+-----------+----------+--------------+  PTV       Full                                                             +---------+---------------+---------+-----------+----------+--------------+  PERO      Full                                                             +---------+---------------+---------+-----------+----------+--------------+     Summary: Right: No evidence of common femoral vein obstruction. Left: There is no evidence of deep vein thrombosis  in the lower extremity. No cystic structure found in the popliteal fossa.  *See table(s) above for measurements and observations.    Preliminary     Procedures Procedures (including critical care time)  Medications Ordered in ED Medications  ketorolac (TORADOL) 30 MG/ML injection 30 mg (has no administration in time range)  sodium chloride flush (NS) 0.9 % injection 3 mL (3 mLs Intravenous Given 08/16/19 0910)  alum & mag hydroxide-simeth (MAALOX/MYLANTA) 200-200-20 MG/5ML suspension 30 mL (30 mLs Oral Given 08/16/19 0910)    And  lidocaine (XYLOCAINE) 2 % viscous mouth solution 15 mL (15 mLs Oral Given  08/16/19 0910)    Initial Impression / Assessment and Plan / ED Course  I have reviewed the triage vital signs and the nursing notes.  Pertinent labs & imaging results that were available during my care of the patient were reviewed by me and considered in my medical decision making (see chart for details).  86 old female appears otherwise well presents for evaluation of chest pain.  Patient has history of chronic chest pain seen quite frequently in the emergency department for this.  She is followed by cardiology.  With negative stress echo in June 2020.  She had a coronary CTA which did not show any evidence of calcifications normal coronaries.  It seems she does have chronic pain to her left lower leg according to cardiology.  According to last cardiology note, Dr. Percival Spanish patient with atypical chest pain with no evidence of structural heart disease including no evidence of CAD.  Labs and imaging resulted prior to my initial evaluation.  Pregnancy test negative Delta troponin negative CBC without leukocytosis, hemoglobin 16.1 Metabolic panel with mild hypo-kalemia 3.3, creatinine 1.07.  Given patient does have risk factors for PE, on OCP, chest pain, left calf pain will obtain d-dimer, ultrasound to rule out blood clot.  She has had 2- CTAs in the past and some of her symptoms to appear  chronic in nature however still need to rule this out during her visit today.  If negative likely DC home with follow-up with cardiology.  Also had had some reflux type symptoms.  Will give GI cocktail in ED.  1100: Patient reevaluated she has now current complaints.  She does appear anxious however does not want anything for anxiety.  D-dimer negative, ultrasound negative for DVT.  Low suspicion for ACS, PE, dissection, [place], myocarditis, pericarditis as cause of her symptoms.  Discussed with patient follow-up with GI for her reflux as well as cardiology given her recurrent chest pain.  Unsure if this is cardiac in nature given she has had negative work-up less than 2 months ago.  It seems she does have symptoms related to anxiety however states she does not want any medications for this at this time.  She denies SI, HI, AVH.  She does not want to talk to anyone from psychiatry at this time for symptoms.  Thorough discussion with patient follow-up with PCP for reevaluation. She does not want COVID testing at this time.  Patient is to be discharged with recommendation to follow up with PCP in regards to today's hospital visit. Chest pain is not likely of cardiac or pulmonary etiology d/t presentation, PERC negative, VSS, no tracheal deviation, no JVD or new murmur, RRR, breath sounds equal bilaterally, EKG without acute abnormalities, negative troponin, and negative CXR. Pt has been advised to return to the ED if CP becomes exertional, associated with diaphoresis or nausea, radiates to left jaw/arm, worsens or becomes concerning in any way. Pt appears reliable for follow up and is agreeable to discharge.        Final Clinical Impressions(s) / ED Diagnoses   Final diagnoses:  Precordial pain  Gastroesophageal reflux disease without esophagitis    ED Discharge Orders    None       Jd Mccaster A, PA-C 08/16/19 1108    Milton Ferguson, MD 08/16/19 1559

## 2019-08-16 NOTE — ED Notes (Signed)
Intermittent CP to L side chest over a week with burning sensation.  Does see a cardiologist for ectopy and starting to see a GI doctor for a scope in the [place].  Currently pain free without outward distress.  Pt reports her sx as a "palpitations".  Pleasant and alert and talkative.

## 2019-08-16 NOTE — ED Triage Notes (Signed)
Patient with chest pain that she describes as a squeezing, tightness that comes and goes.  Mild shortness of breath, no nausea or vomiting.  She states that it has been going on for that last few days, increased in the last day.

## 2019-08-16 NOTE — Progress Notes (Signed)
Left Lower Ext. Venous  has been completed. Refer to [facility] under chart review to view preliminary results.   08/16/2019  11:26 AM Luiscarlos Kaczmarczyk, Bonnye Fava

## 2019-08-19 ENCOUNTER — Ambulatory Visit (INDEPENDENT_AMBULATORY_CARE_PROVIDER_SITE_OTHER): Payer: Managed Care, Other (non HMO) | Admitting: Family Medicine

## 2019-08-19 ENCOUNTER — Encounter: Payer: Self-pay | Admitting: Family Medicine

## 2019-08-19 ENCOUNTER — Other Ambulatory Visit: Payer: Self-pay

## 2019-08-19 VITALS — BP 156/94 | HR 72 | Temp 98.2°F | Ht 64.0 in | Wt 171.2 lb

## 2019-08-19 DIAGNOSIS — R42 Dizziness and giddiness: Secondary | ICD-10-CM

## 2019-08-19 DIAGNOSIS — R072 Precordial pain: Secondary | ICD-10-CM

## 2019-08-19 NOTE — Progress Notes (Signed)
Established Patient Office Visit  Subjective:  Patient ID: Brooke Turner, female    DOB: [birth year]  Age: 43 y.o. MRN: [id number]  CC:  Chief Complaint  Patient presents with  . Hospitalization Follow-up    chest tightness and burning. going on 2 weeks   . Dizziness    HPI Brooke Turner presents for   Patient is here for her repeated issues with chest pain She has been to the ER numerous times Nothing has been detected Her bp ha been increasing She gets weird sensations with exercise No nausea or vomiting  Past Medical History:  Diagnosis Date  . Anxiety   . Back pain   . Chest pain    a. 09/2014 Echo: EF 60-65%, no rwma, nl valves;  b. 12/2014 Neg ETT.  . Essential hypertension    a. 01/2016 Renal duplex: no RAS- ? renal cyst (renal u/s 4/17 - no cyst, nl study); b. 02/2016 24 hr BP Monitor: Mean SBP ~ 138 with Max of 198. Highest pressures noted in evening hours following placement of cuff.  . Fibroid uterus   . GERD (gastroesophageal reflux disease)   . Palpitations    a. 09/2014 nl event monitor.    Past Surgical History:  Procedure Laterality Date  . WISDOM TOOTH EXTRACTION      Family History  Problem Relation Age of Onset  . Seizures Mother   . Epilepsy Mother   . Cancer Father        Liver  . Diabetes Father 63  . Hypertension Father   . Heart disease Father 76       CEA, LE Stenting  . Alzheimer's disease Maternal Grandmother   . Heart disease Maternal Grandfather   . Colon cancer Neg Hx   . Breast cancer Neg Hx     Social History   Socioeconomic History  . Marital status: Single    Spouse name: N/A  . Number of children: 0  . Years of education: 43  . Highest education level: Not on file  Occupational History  . Occupation: Research scientist (physical sciences): [employer]  Social Needs  . Financial resource strain: Not on file  . Food insecurity    Worry: Not on file    Inability: Not on file  .  Transportation needs    Medical: Not on file    Non-medical: Not on file  Tobacco Use  . Smoking status: Former Smoker    Packs/day: 0.25    Years: 10.00    Pack years: 2.50    Types: Cigarettes    Start date: 06/25/2000    Quit date: 08/19/2014    Years since quitting: 5.0  . Smokeless tobacco: Never Used  Substance and Sexual Activity  . Alcohol use: No    Alcohol/week: 0.0 standard drinks  . Drug use: No  . Sexual activity: Not Currently    Partners: Male    Birth control/protection: Pill  Lifestyle  . Physical activity    Days per week: Not on file    Minutes per session: Not on file  . Stress: Not on file  Relationships  . Social Herbalist on phone: Not on file    Gets together: Not on file    Attends religious service: Not on file    Active member of club or organization: Not on file    Attends meetings of clubs or organizations: Not on file    Relationship status: Not  on file  . Intimate partner violence    Fear of current or ex partner: Not on file    Emotionally abused: Not on file    Physically abused: Not on file    Forced sexual activity: Not on file  Other Topics Concern  . Not on file  Social History Narrative   Patient lives at home alone .   Patient works full time at [employer]   Education college.   Right handed.   Caffeine none    Outpatient Medications Prior to Visit  Medication Sig Dispense Refill  . acetaminophen (TYLENOL) 500 MG tablet Take 500 mg by mouth every 6 (six) hours as needed (back pain/ cramps).     . ALPRAZolam (NIRAVAM) 0.25 MG dissolvable tablet Take 0.25 mg by mouth at bedtime as needed for anxiety.    Marland Kitchen atenolol (TENORMIN) 25 MG tablet Take 1/4 tablet every morning 180 tablet 3  . blood glucose meter kit and supplies Test blood sugar twice daily. Dx code: R73.09 1 each 0  . busPIRone (BUSPAR) 10 MG tablet Take 10 mg by mouth daily.    . Cyanocobalamin (VITAMIN B-12 PO) Take 1 tablet by mouth daily.    . famotidine  (PEPCID) 20 MG tablet Take 1 tablet (20 mg total) by mouth 2 (two) times daily. 180 tablet 0  . fluticasone (FLONASE) 50 MCG/ACT nasal spray Place 2 sprays into both nostrils daily. (Patient taking differently: Place 2 sprays into both nostrils daily as needed (seasonal allergies). ) 16 g 0  . glucose blood (ONETOUCH VERIO) test strip TEST BLOOD SUGAR DAILY 25 each 10  . levalbuterol (XOPENEX HFA) 45 MCG/ACT inhaler Inhale 1-2 puffs every 6 hours if needed- rescue inhaler 1 Inhaler 12  . LORazepam (ATIVAN) 0.5 MG tablet Take 1 tablet (0.5 mg total) by mouth every 6 (six) hours as needed for anxiety. (Patient taking differently: Take 0.25 mg by mouth 3 (three) times daily as needed for anxiety. ) 15 tablet 0  . Multiple Vitamins-Minerals (MULTIVITAMIN GUMMIES ADULT PO) Take 2 tablets by mouth daily.     . Norethindrone-Ethinyl Estradiol-Fe Biphas (LO LOESTRIN FE) 1 MG-10 MCG / 10 MCG tablet Take 1 tablet by mouth at bedtime.     [name] DELICA LANCETS 50T MISC USE TO TEST BLOOD SUGAR DAILY 100 each 3  . pantoprazole (PROTONIX) 40 MG tablet TAKE 1 TABLET BY MOUTH ONCE DAILY BEFORE BREAKFAST 90 tablet 0  . albuterol (PROVENTIL HFA;VENTOLIN HFA) 108 (90 Base) MCG/ACT inhaler Inhale 1-2 puffs into the lungs every 6 (six) hours as needed for wheezing or shortness of breath. 1 Inhaler 0  . SPIRIVA RESPIMAT 1.25 MCG/ACT AERS Inhale 1.25 mcg into the lungs daily.     Facility-Administered Medications Prior to Visit  Medication Dose Route Frequency Provider Last Rate Last Dose  . cyanocobalamin ((VITAMIN B-12)) injection 1,000 mcg  1,000 mcg Intramuscular Q30 days [name], MD   1,000 mcg at 10/19/18 1837    Allergies  Allergen Reactions  . Penicillins Hives    Has patient had a PCN reaction causing immediate rash, facial/tongue/throat swelling, SOB or lightheadedness with hypotension:Yes Has patient had a PCN reaction causing severe rash involving mucus membranes or skin necrosis:unsure Has  patient had a PCN reaction that required hospitalization: Yes Has patient had a PCN reaction occurring within the last 10 years: No If all of the above answers are "NO", then may proceed with Cephalosporin use.     . Tamiflu [Oseltamivir Phosphate] Nausea  And Vomiting    Excessive vomiting  . Tinidazole Other (See Comments)    Caused severe abd pain/type of colitis   . Diltiazem Other (See Comments)    Dizzy, felt like she was going to pass out Blood pressure went up per patient     ROS Review of Systems Review of Systems  Constitutional: Negative for activity change, appetite change, chills and fever.  HENT: Negative for congestion, nosebleeds, trouble swallowing and voice change.   Respiratory: Negative for cough, shortness of breath and wheezing.   Gastrointestinal: Negative for diarrhea, nausea and vomiting.  Genitourinary: Negative for difficulty urinating, dysuria, flank pain and hematuria.  Musculoskeletal: Negative for back pain, joint swelling and neck pain.  Neurological: Negative for dizziness, speech difficulty, light-headedness and numbness.  See HPI. All other review of systems negative.     Objective:    Physical Exam  BP (!) 156/94 (BP Location: Left Arm, Patient Position: Sitting, Cuff Size: Large)   Pulse 72   Temp 98.2 F (36.8 C) (Oral)   Ht _0  (1.626 m)   Wt 171 lb 3.2 oz (77.7 kg)   LMP 03/11/2018   SpO2 100%   BMI 29.39 kg/m  Wt Readings from Last 3 Encounters:  08/19/19 171 lb 3.2 oz (77.7 kg)  07/11/19 165 lb 6.4 oz (75 kg)  06/28/19 167 lb (75.8 kg)    Physical Exam  Constitutional: Oriented to person, place, and time. Appears well-developed and well-nourished.  HENT:  Head: Normocephalic and atraumatic.  Eyes: Conjunctivae and EOM are normal.  Cardiovascular: Normal rate, regular rhythm, normal heart sounds and intact distal pulses.  No murmur heard. Pulmonary/Chest: Effort normal and breath sounds normal. No stridor. No  respiratory distress. Has no wheezes.  Neurological: Is alert and oriented to person, place, and time.  Skin: Skin is warm. Capillary refill takes less than 2 seconds.  Psychiatric: Has a normal mood and affect. Behavior is normal. Judgment and thought content normal.   There are no preventive care reminders to display for this patient.  There are no preventive care reminders to display for this patient.  Lab Results  Component Value Date   TSH 0.600 04/19/2019   Lab Results  Component Value Date   WBC 6.6 08/16/2019   HGB 13.1 08/16/2019   HCT 39.0 08/16/2019   MCV 90.9 08/16/2019   PLT 256 08/16/2019   Lab Results  Component Value Date   NA 138 08/16/2019   K 3.3 (L) 08/16/2019   CO2 25 08/16/2019   GLUCOSE 135 (H) 08/16/2019   BUN 9 08/16/2019   CREATININE 1.07 (H) 08/16/2019   BILITOT 1.3 (H) 02/28/2019   ALKPHOS 36 (L) 02/28/2019   AST 16 02/28/2019   ALT 13 02/28/2019   PROT 6.9 02/28/2019   ALBUMIN 4.1 02/28/2019   CALCIUM 8.8 (L) 08/16/2019   ANIONGAP 11 08/16/2019   GFR 70.52 02/28/2019   Lab Results  Component Value Date   CHOL 172 10/25/2016   Lab Results  Component Value Date   HDL 49 10/25/2016   Lab Results  Component Value Date   LDLCALC 106 (H) 10/25/2016   Lab Results  Component Value Date   TRIG 87 10/25/2016   Lab Results  Component Value Date   CHOLHDL 3.5 10/25/2016   Lab Results  Component Value Date   HGBA1C 5.6 04/24/2019      Assessment & Plan:   Problem List Items Addressed This Visit    None    Visit  Diagnoses    Precordial pain    -  Primary   Relevant Orders   EKG 12-Lead (Completed)     EKG reviewed independently and compared to previous ECG No ST elevation or tachycardia - normal findings No other new findings  Pt had extensive workup with CT coronary, ECG, holter monitor, CXR She will follow up with Cardiology.   No orders of the defined types were placed in this encounter.   Follow-up: No  follow-ups on file.   A total of 30 minutes were spent face-to-face with the patient during this encounter and over half of that time was spent on counseling and coordination of care.   Forrest Moron, MD

## 2019-08-19 NOTE — Patient Instructions (Signed)
° ° ° °  If you have lab work done today you will be contacted with your lab results within the next 2 weeks.  If you have not heard from us then please contact us. The fastest way to get your results is to register for My Chart. ° ° °IF you received an x-ray today, you will receive an invoice from [facility]. Please contact [facility] at [phone] with questions or concerns regarding your invoice.  ° °IF you received labwork today, you will receive an invoice from [facility]. Please contact [facility] at [phone] with questions or concerns regarding your invoice.  ° °Our billing staff will not be able to assist you with questions regarding bills from these companies. ° °You will be contacted with the lab results as soon as they are available. The fastest way to get your results is to activate your My Chart account. Instructions are located on the last page of this paperwork. If you have not heard from us regarding the results in 2 weeks, please contact this office. °  ° ° ° °

## 2019-08-20 ENCOUNTER — Other Ambulatory Visit: Payer: Self-pay

## 2019-08-20 ENCOUNTER — Telehealth (HOSPITAL_COMMUNITY): Payer: Self-pay

## 2019-08-20 ENCOUNTER — Telehealth: Payer: Self-pay | Admitting: Cardiology

## 2019-08-20 DIAGNOSIS — I83893 Varicose veins of bilateral lower extremities with other complications: Secondary | ICD-10-CM

## 2019-08-20 NOTE — Telephone Encounter (Signed)
Voicemail was left for patient including appointment time and information and instructions regarding COVID-19 safety procedures including mask usage, rescheduling if sympotmatic, and limited visitors in testing area.  

## 2019-08-20 NOTE — Telephone Encounter (Signed)
Pt called in and states that she went to the ER last Thursday and states that she has been having pain in her chest and states that this has been happening for more than 2 weeks that is why she went to the er. C/o pain in her chest that is different than before, tightness in her chest, burning sensation. This pain is constant.She states that that this is stressing her out and scaring her, she states that she is sleeping in the parking lot of the ER because this is scaring her so much. She states that her BP has been OK funning 140's-150's/90's just like yesterday when she went to her PCP. She states that she has tried taking ASA and Tylenol and this seems to help but the pain does not go away. She states that this happens a all different times sitting around, at rest and when she is active. She is wondering what can be done now? Another test or monitor? Please advise.

## 2019-08-20 NOTE — Telephone Encounter (Signed)
Agree 

## 2019-08-20 NOTE — Telephone Encounter (Signed)
S/w nurse per Dr Percival Spanish all testing has been ordered and came back negative if having chest pain all we can do is go to ER again

## 2019-08-20 NOTE — Telephone Encounter (Signed)
Returned call to pt relayed Dr [employer]. Pt still wanted an appointment to discuss this chest pain so, I scheduled 1st available APP appt-Thursday virtual with Brooke Turner to discuss this new chest pain. She has an EKG from yesterday at her PCp and one from last week from the ER.

## 2019-08-21 ENCOUNTER — Emergency Department (HOSPITAL_COMMUNITY)
Admission: EM | Admit: 2019-08-21 | Discharge: 2019-08-22 | Disposition: A | Discharge status: Home / Self Care | Payer: Managed Care, Other (non HMO) | Attending: Emergency Medicine | Admitting: Emergency Medicine

## 2019-08-21 ENCOUNTER — Telehealth: Payer: Self-pay | Admitting: Gastroenterology

## 2019-08-21 ENCOUNTER — Emergency Department (HOSPITAL_COMMUNITY): Payer: Managed Care, Other (non HMO)

## 2019-08-21 ENCOUNTER — Encounter: Payer: Self-pay | Admitting: Vascular Surgery

## 2019-08-21 ENCOUNTER — Ambulatory Visit (INDEPENDENT_AMBULATORY_CARE_PROVIDER_SITE_OTHER)
Admission: RE | Admit: 2019-08-21 | Discharge: 2019-08-21 | Disposition: A | Discharge status: Home / Self Care | Payer: Self-pay | Source: Ambulatory Visit | Attending: Vascular Surgery | Admitting: Vascular Surgery

## 2019-08-21 ENCOUNTER — Encounter: Payer: Self-pay | Admitting: *Deleted

## 2019-08-21 ENCOUNTER — Ambulatory Visit (INDEPENDENT_AMBULATORY_CARE_PROVIDER_SITE_OTHER): Payer: Self-pay | Admitting: Vascular Surgery

## 2019-08-21 ENCOUNTER — Other Ambulatory Visit: Payer: Self-pay | Admitting: *Deleted

## 2019-08-21 ENCOUNTER — Telehealth: Payer: Self-pay | Admitting: Family Medicine

## 2019-08-21 ENCOUNTER — Encounter (HOSPITAL_COMMUNITY): Payer: Self-pay

## 2019-08-21 ENCOUNTER — Other Ambulatory Visit: Payer: Self-pay

## 2019-08-21 VITALS — BP 154/86 | HR 74 | Resp 14 | Ht 64.0 in | Wt 168.0 lb

## 2019-08-21 DIAGNOSIS — I83893 Varicose veins of bilateral lower extremities with other complications: Secondary | ICD-10-CM | POA: Insufficient documentation

## 2019-08-21 DIAGNOSIS — R0789 Other chest pain: Secondary | ICD-10-CM | POA: Insufficient documentation

## 2019-08-21 DIAGNOSIS — I10 Essential (primary) hypertension: Secondary | ICD-10-CM | POA: Insufficient documentation

## 2019-08-21 DIAGNOSIS — Z87891 Personal history of nicotine dependence: Secondary | ICD-10-CM | POA: Insufficient documentation

## 2019-08-21 DIAGNOSIS — I83812 Varicose veins of left lower extremities with pain: Secondary | ICD-10-CM

## 2019-08-21 DIAGNOSIS — Z1159 Encounter for screening for other viral diseases: Secondary | ICD-10-CM

## 2019-08-21 DIAGNOSIS — Z79899 Other long term (current) drug therapy: Secondary | ICD-10-CM | POA: Insufficient documentation

## 2019-08-21 LAB — CBC
HCT: 37.2 % (ref 36.0–46.0)
Hemoglobin: 13.1 g/dL (ref 12.0–15.0)
MCH: 31.6 pg (ref 26.0–34.0)
MCHC: 35.2 g/dL (ref 30.0–36.0)
MCV: 89.6 fL (ref 80.0–100.0)
Platelets: 231 10*3/uL (ref 150–400)
RBC: 4.15 MIL/uL (ref 3.87–5.11)
RDW: 12 % (ref 11.5–15.5)
WBC: 7.2 10*3/uL (ref 4.0–10.5)
nRBC: 0 % (ref 0.0–0.2)

## 2019-08-21 LAB — I-STAT BETA HCG BLOOD, ED (MC, WL, AP ONLY): I-stat hCG, quantitative: <5 m[IU]/mL (ref <5)

## 2019-08-21 MED ORDER — SODIUM CHLORIDE 0.9% FLUSH: 3.0000 mL | Freq: Once | INTRAVENOUS | Status: DC

## 2019-08-21 NOTE — Telephone Encounter (Signed)
Spoke to the patient who has several questions about anesthesia and possible side effects. This RN answered the patient's questions to her satisfaction. Patient felt comfortable and decided to schedule procedure. Patient scheduled for the following:   08/30/2019 at 4:30 pm Pre-op visit with nurse  09/16/2019 at 12:10 COVID screening  09/18/2019 at 4:00 EGD in [place]  SARS coronavirus 2 order placed in Epic.

## 2019-08-21 NOTE — Progress Notes (Signed)
Referring Physician: Dr Grant Fontana  Patient name: Brooke Turner MRN: [id number] DOB: [birth year] Sex: female  REASON FOR CONSULT: Leg pain  HPI: Brooke Turner is a 43 y.o. female, who has noted slow progressive increase in the appearance of veins in her left leg.  However, her main complaint is she develops numbness and tingling in both feet during the course of the day.  This can occur in a sitting position or in a standing position.  She also occasionally gets some cramping in her left calf.  She does not really describe claudication.  She has had some problems with her back in the past.  She has no family history of varicose veins she has no prior history of DVT.  Other medical problems include hypertension reflux both of which have been stable.  She is a former smoker quit several years ago.  Past Medical History:  Diagnosis Date  . Anxiety   . Back pain   . Chest pain    a. 09/2014 Echo: EF 60-65%, no rwma, nl valves;  b. 12/2014 Neg ETT.  . Essential hypertension    a. 01/2016 Renal duplex: no RAS- ? renal cyst (renal u/s 4/17 - no cyst, nl study); b. 02/2016 24 hr BP Monitor: Mean SBP ~ 138 with Max of 198. Highest pressures noted in evening hours following placement of cuff.  . Fibroid uterus   . GERD (gastroesophageal reflux disease)   . Palpitations    a. 09/2014 nl event monitor.   Past Surgical History:  Procedure Laterality Date  . WISDOM TOOTH EXTRACTION      Family History  Problem Relation Age of Onset  . Seizures Mother   . Epilepsy Mother   . Cancer Father        Liver  . Diabetes Father 3  . Hypertension Father   . Heart disease Father 52       CEA, LE Stenting  . Alzheimer's disease Maternal Grandmother   . Heart disease Maternal Grandfather   . Colon cancer Neg Hx   . Breast cancer Neg Hx     SOCIAL HISTORY: Social History   Socioeconomic History  . Marital status: Single    Spouse name: N/A  . Number of children: 0   . Years of education: 52  . Highest education level: Not on file  Occupational History  . Occupation: Research scientist (physical sciences): [employer]  Social Needs  . Financial resource strain: Not on file  . Food insecurity    Worry: Not on file    Inability: Not on file  . Transportation needs    Medical: Not on file    Non-medical: Not on file  Tobacco Use  . Smoking status: Former Smoker    Packs/day: 0.25    Years: 10.00    Pack years: 2.50    Types: Cigarettes    Start date: 06/25/2000    Quit date: 08/19/2014    Years since quitting: 5.0  . Smokeless tobacco: Never Used  Substance and Sexual Activity  . Alcohol use: No    Alcohol/week: 0.0 standard drinks  . Drug use: No  . Sexual activity: Not Currently    Partners: Male    Birth control/protection: Pill  Lifestyle  . Physical activity    Days per week: Not on file    Minutes per session: Not on file  . Stress: Not on file  Relationships  . Social connections    Talks  on phone: Not on file    Gets together: Not on file    Attends religious service: Not on file    Active member of club or organization: Not on file    Attends meetings of clubs or organizations: Not on file    Relationship status: Not on file  . Intimate partner violence    Fear of current or ex partner: Not on file    Emotionally abused: Not on file    Physically abused: Not on file    Forced sexual activity: Not on file  Other Topics Concern  . Not on file  Social History Narrative   Patient lives at home alone .   Patient works full time at [employer]   Education college.   Right handed.   Caffeine none    Allergies  Allergen Reactions  . Penicillins Hives    Has patient had a PCN reaction causing immediate rash, facial/tongue/throat swelling, SOB or lightheadedness with hypotension:Yes Has patient had a PCN reaction causing severe rash involving mucus membranes or skin necrosis:unsure Has patient had a PCN reaction that required  hospitalization: Yes Has patient had a PCN reaction occurring within the last 10 years: No If all of the above answers are "NO", then may proceed with Cephalosporin use.     . Tamiflu [Oseltamivir Phosphate] Nausea And Vomiting    Excessive vomiting  . Tinidazole Other (See Comments)    Caused severe abd pain/type of colitis   . Diltiazem Other (See Comments)    Dizzy, felt like she was going to pass out Blood pressure went up per patient     Current Outpatient Medications  Medication Sig Dispense Refill  . acetaminophen (TYLENOL) 500 MG tablet Take 500 mg by mouth every 6 (six) hours as needed (back pain/ cramps).     . ALPRAZolam (NIRAVAM) 0.25 MG dissolvable tablet Take 0.25 mg by mouth at bedtime as needed for anxiety.    Marland Kitchen atenolol (TENORMIN) 25 MG tablet Take 1/4 tablet every morning 180 tablet 3  . blood glucose meter kit and supplies Test blood sugar twice daily. Dx code: R73.09 1 each 0  . busPIRone (BUSPAR) 10 MG tablet Take 10 mg by mouth daily.    . Cyanocobalamin (VITAMIN B-12 PO) Take 1 tablet by mouth daily.    . famotidine (PEPCID) 20 MG tablet Take 1 tablet (20 mg total) by mouth 2 (two) times daily. 180 tablet 0  . fluticasone (FLONASE) 50 MCG/ACT nasal spray Place 2 sprays into both nostrils daily. (Patient taking differently: Place 2 sprays into both nostrils daily as needed (seasonal allergies). ) 16 g 0  . glucose blood (ONETOUCH VERIO) test strip TEST BLOOD SUGAR DAILY 25 each 10  . levalbuterol (XOPENEX HFA) 45 MCG/ACT inhaler Inhale 1-2 puffs every 6 hours if needed- rescue inhaler 1 Inhaler 12  . LORazepam (ATIVAN) 0.5 MG tablet Take 1 tablet (0.5 mg total) by mouth every 6 (six) hours as needed for anxiety. (Patient taking differently: Take 0.25 mg by mouth 3 (three) times daily as needed for anxiety. ) 15 tablet 0  . Multiple Vitamins-Minerals (MULTIVITAMIN GUMMIES ADULT PO) Take 2 tablets by mouth daily.     . Norethindrone-Ethinyl Estradiol-Fe Biphas (LO  LOESTRIN FE) 1 MG-10 MCG / 10 MCG tablet Take 1 tablet by mouth at bedtime.     [name] DELICA LANCETS 01U MISC USE TO TEST BLOOD SUGAR DAILY 100 each 3  . pantoprazole (PROTONIX) 40 MG tablet TAKE 1 TABLET BY  MOUTH ONCE DAILY BEFORE BREAKFAST 90 tablet 0   Current Facility-Administered Medications  Medication Dose Route Frequency Provider Last Rate Last Dose  . cyanocobalamin ((VITAMIN B-12)) injection 1,000 mcg  1,000 mcg Intramuscular Q30 days [name], MD   1,000 mcg at 10/19/18 1837    ROS:   General:  No weight loss, Fever, chills  HEENT: No recent headaches, no nasal bleeding, no visual changes, no sore throat  Neurologic: No dizziness, blackouts, seizures. No recent symptoms of stroke or mini- stroke. No recent episodes of slurred speech, or temporary blindness.  Cardiac: No recent episodes of chest pain/pressure, no shortness of breath at rest.  No shortness of breath with exertion.  Denies history of atrial fibrillation or irregular heartbeat  Vascular: No history of rest pain in feet.  No history of claudication.  No history of non-healing ulcer, No history of DVT   Pulmonary: No home oxygen, no productive cough, no hemoptysis,  No asthma or wheezing  Musculoskeletal:  []  Arthritis, [X]  Low back pain,  [ ]  Joint pain  Hematologic:No history of hypercoagulable state.  No history of easy bleeding.  No history of anemia  Gastrointestinal: No hematochezia or melena,  No gastroesophageal reflux, no trouble swallowing  Urinary: [ ]  chronic Kidney disease, [ ]  on HD - [ ]  MWF or [ ]  TTHS, [ ]  Burning with urination, [ ]  Frequent urination, [ ]  Difficulty urinating;   Skin: No rashes  Psychological: No history of anxiety,  No history of depression   Physical Examination  Vitals:   08/21/19 1457  BP: (!) 154/86  Pulse: 74  Resp: 14  SpO2: 100%  Weight: 168 lb (76.2 kg)  Height: 5' 4"  (1.626 m)    Body mass index is 28.84 kg/m.  General:  Alert and oriented,  no acute distress HEENT: Normal Neck: No JVD Cardiac: Regular Rate and Rhythm  Skin: No rash, cluster of reticular and spider type varicosities left posterior medial calf covering the surface area of about 5 to 7 cm Extremity Pulses:  2+ radial, brachial, femoral, dorsalis pedis, posterior tibial pulses bilaterally Musculoskeletal: No deformity or edema  Neurologic: Upper and lower extremity motor 5/5 and symmetric  DATA:  Patient had a venous duplex exam for reflux today.  This did not show any DVT.  There was no evidence of reflux in the greater saphenous or lesser saphenous bilaterally.  Vein diameter was for the most part less than 3 mm.  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  No evidence of significant venous reflux.  Clinically no evidence of arterial occlusive disease or other etiology to explain the numbness and tingling symptoms in her feet or the cramping in her left calf.  I do not believe these are related to the spider and reticular type varicosities in her leg.  She has no evidence of significant reflux on her venous duplex exam or DVT.   PLAN: Patient was given recommendations today regarding compression stockings.  We also offered her sclerotherapy of her reticular and spider veins if she wished for cosmetic purposes.  She will call us if she wishes to proceed without at some point in the future.  Pathophysiology of arterial and venous disease was discussed with the patient as well   Ruta Hinds, MD [facility]: [phone] Pager: [phone]

## 2019-08-21 NOTE — Telephone Encounter (Signed)
May schedule the EGD without an office visit first. Thanks.

## 2019-08-21 NOTE — Progress Notes (Signed)
Virtual Visit via Video Note   This visit type was conducted due to national recommendations for restrictions regarding the COVID-19 Pandemic (e.g. social distancing) in an effort to limit this patient's exposure and mitigate transmission in our community.  Due to her co-morbid illnesses, this patient is at least at moderate risk for complications without adequate follow up.  This format is felt to be most appropriate for this patient at this time.  All issues noted in this document were discussed and addressed.  A limited physical exam was performed with this format.  Please refer to the patient's chart for her consent to telehealth for [facility].   Date:  08/21/2019   ID:  Brooke Turner, DOB [id number], MRN [id number]  Patient Location: Home Provider Location: Home  PCP:  Forrest Moron, MD  Cardiologist:  Minus Breeding, MD  Electrophysiologist:  Virl Axe, MD   Evaluation Performed:  Follow-Up Visit  Chief Complaint: Recurrent chest pain  History of Present Illness:    Brooke Turner is a 43 y.o. female with chronic chest pain, frequent palpitations, frequent ER evaluations for recurrent symptoms.  She has worn multiple monitors which have been unrevealing since April 2020.  She also had a coronary CTA and was found to have a calcium score of 0.  Normal coronary arteries.  The patient calls her office fairly often with multiple complaints.  She is given reassurance each time.   She called our office on 08/20/2019 with complaints of recurrent chest pain.  She was again seen in the emergency room on 08/16/2019 for complaints of chest pain.  EKG revealed normal sinus rhythm without changes.  She was ruled out for ACS.  She has a past medical history of PTSD and panic disorder, GERD, OSA on CPAP, and chronic anxiety about her health.  She was again in the emergency room yesterday with continued complaints of chest pain.  She was diagnosed with  musculoskeletal pain, that was reproducible on palpation and upper body movement.  She continues to feel that this pain is related to her heart, as it appears to be "new" as it is been "a little different" than her normal chest pain.  I did review ER records.  She was ruled out again for ACS, labs were normal.  EKG was also normal.  The patient does not have symptoms concerning for COVID-19 infection (fever, chills, cough, or new shortness of breath).    Past Medical History:  Diagnosis Date  . Anxiety   . Back pain   . Chest pain    a. 09/2014 Echo: EF 60-65%, no rwma, nl valves;  b. 12/2014 Neg ETT.  . Essential hypertension    a. 01/2016 Renal duplex: no RAS- ? renal cyst (renal u/s 4/17 - no cyst, nl study); b. 02/2016 24 hr BP Monitor: Mean SBP ~ 138 with Max of 198. Highest pressures noted in evening hours following placement of cuff.  . Fibroid uterus   . GERD (gastroesophageal reflux disease)   . Palpitations    a. 09/2014 nl event monitor.   Past Surgical History:  Procedure Laterality Date  . WISDOM TOOTH EXTRACTION       No outpatient medications have been marked as taking for the 08/22/19 encounter (Appointment) with Lendon Colonel, NP.   Current Facility-Administered Medications for the 08/22/19 encounter (Appointment) with Lendon Colonel, NP  Medication  . cyanocobalamin ((VITAMIN B-12)) injection 1,000 mcg     Allergies:   Penicillins, Tamiflu [oseltamivir  phosphate], Tinidazole, and Diltiazem   Social History   Tobacco Use  . Smoking status: Former Smoker    Packs/day: 0.25    Years: 10.00    Pack years: 2.50    Types: Cigarettes    Start date: 06/25/2000    Quit date: 08/19/2014    Years since quitting: 5.0  . Smokeless tobacco: Never Used  Substance Use Topics  . Alcohol use: No    Alcohol/week: 0.0 standard drinks  . Drug use: No     Family Hx: The patient's family history includes Alzheimer's disease in her maternal grandmother; Cancer in  her father; Diabetes (age of onset: 8) in her father; Epilepsy in her mother; Heart disease in her maternal grandfather; Heart disease (age of onset: 1) in her father; Hypertension in her father; Seizures in her mother. There is no history of Colon cancer or Breast cancer.  ROS:   Please see the history of present illness.    All other systems reviewed and are negative.   Prior CV studies:   The following studies were reviewed today:  Coronary CTA:  1. Coronary calcium score of 0. This was 0 percentile for age and sex matched control.  2. Normal coronary origin with right dominance.  3. No evidence of CAD in the LAD or LCx. No CAD in the visualized portions of the RCA but cannot comment on the PDA or PL which were poorly visualized due to motion artifact.  Echocardiogram 04/10/2019 1. The left ventricle has normal systolic function with an ejection fraction of 60-65%. The cavity size was normal. Left ventricular diastolic Doppler parameters are consistent with impaired relaxation. No evidence of left ventricular regional wall  motion abnormalities.  2. The right ventricle has normal systolic function. The cavity was normal. There is no increase in right ventricular wall thickness.  3. No evidence of mitral valve stenosis. No significant regurgitation.  4. The aortic valve is tricuspid. No stenosis of the aortic valve.  5. The aortic root is normal in size and structure.  6. Normal IVC size. No complete TR doppler jet so unable to estimate PA systolic pressure.  Labs/Other Tests and Data Reviewed:    EKG:  No ECG reviewed.  Recent Labs: 10/08/2018: B Natriuretic Peptide 36.0 10/18/2018: Magnesium 2.1 02/28/2019: ALT 13 04/19/2019: TSH 0.600 08/16/2019: BUN 9; Creatinine, Ser 1.07; Hemoglobin 13.1; Platelets 256; Potassium 3.3; Sodium 138   Recent Lipid Panel Lab Results  Component Value Date/Time   CHOL 172 10/25/2016 06:50 PM   TRIG 87 10/25/2016 06:50 PM   HDL 49  10/25/2016 06:50 PM   CHOLHDL 3.5 10/25/2016 06:50 PM   LDLCALC 106 (H) 10/25/2016 06:50 PM   LDLDIRECT 101 (H) 10/25/2016 06:50 PM    Wt Readings from Last 3 Encounters:  08/19/19 171 lb 3.2 oz (77.7 kg)  07/11/19 165 lb 6.4 oz (75 kg)  06/28/19 167 lb (75.8 kg)     Objective:    Vital Signs:  There were no vitals taken for this visit.   VITAL SIGNS:  reviewed GEN:  no acute distress RESPIRATORY:  normal respiratory effort, symmetric expansion NEURO:  alert and oriented x 3, no obvious focal deficit PSYCH:  normal affect  ASSESSMENT & PLAN:    1.  Atypical chest pain: Patient has had an extensive cardiac work-up all of which have ruled out cardiovascular disease or cardiovascular etiology for her recurrent chest pain.  Her pain appears to be musculoskeletal exacerbated by anxiety related to recurrence of pain.  I had given her reassurance again that her cardiac status is normal and the symptoms she is experiencing is not related to cardiac etiology.    She continues to be anxious about this believing that since the pain is "new" it would be from her heart.  I have again explained to her that this is not the case.  She is to follow-up with her PCP, neurologist, and spine surgeon, for ongoing management as she is being seen by multiple physicians for her symptoms.  She does not need to have a follow-up appointment with cardiology, as no further testing is planned.   COVID-19 Education: The signs and symptoms of COVID-19 were discussed with the patient and how to seek care for testing (follow up with PCP or arrange E-visit).  The importance of social distancing was discussed today.  Time:   Today, I have spent 15 minutes with the patient with telehealth technology discussing the above problems.     Medication Adjustments/Labs and Tests Ordered: Current medicines are reviewed at length with the patient today.  Concerns regarding medicines are outlined above.   Tests Ordered: No  orders of the defined types were placed in this encounter.   Medication Changes: No orders of the defined types were placed in this encounter.   Disposition:  Follow up prn  Signed, Phill Myron. West Pugh, ANP, AACC  08/21/2019 1:23 PM     [facility]

## 2019-08-21 NOTE — Telephone Encounter (Signed)
Dr. Tarri Glenn, you initially spoke to this patient in May. She is now reaching out to schedule an EGD (previous unsuccessful attempts were made to schedule the patient). Are you still agreeable with this plan or do you need to see the patient in clinic first. Please advise.

## 2019-08-21 NOTE — Telephone Encounter (Signed)
Requesting referral for the chest pain she was experiencing. Went to ED and provider stated it may be her chest wall and not heart - a musculoskeletal specialist.

## 2019-08-21 NOTE — ED Triage Notes (Signed)
Pt reports worsening chest pain that has been going on for the past few weeks. Pt seen here last week for the same and was told to follow up with her cardiologist but they told her to come here if it got worse.

## 2019-08-22 ENCOUNTER — Encounter: Payer: Self-pay | Admitting: *Deleted

## 2019-08-22 ENCOUNTER — Other Ambulatory Visit: Payer: Self-pay | Admitting: Family Medicine

## 2019-08-22 ENCOUNTER — Telehealth (INDEPENDENT_AMBULATORY_CARE_PROVIDER_SITE_OTHER): Payer: Managed Care, Other (non HMO) | Admitting: Adult Health

## 2019-08-22 ENCOUNTER — Encounter: Payer: Self-pay | Admitting: Vascular Surgery

## 2019-08-22 ENCOUNTER — Encounter: Payer: Self-pay | Admitting: Adult Health

## 2019-08-22 VITALS — BP 135/76 | HR 68 | Ht 64.0 in | Wt 165.0 lb

## 2019-08-22 DIAGNOSIS — R0789 Other chest pain: Secondary | ICD-10-CM

## 2019-08-22 DIAGNOSIS — R002 Palpitations: Secondary | ICD-10-CM

## 2019-08-22 DIAGNOSIS — F418 Other specified anxiety disorders: Secondary | ICD-10-CM

## 2019-08-22 LAB — BASIC METABOLIC PANEL
Anion gap: 12 (ref 5–15)
BUN: 10 mg/dL (ref 6–20)
CO2: 22 mmol/L (ref 22–32)
Calcium: 8.8 mg/dL — ABNORMAL LOW (ref 8.9–10.3)
Chloride: 104 mmol/L (ref 98–111)
Creatinine, Ser: 1.13 mg/dL — ABNORMAL HIGH (ref 0.44–1.00)
GFR calc Af Amer: >60 mL/min (ref >60)
GFR calc non Af Amer: 59 mL/min — ABNORMAL LOW (ref >60)
Glucose, Bld: 87 mg/dL (ref 70–99)
Potassium: 3.4 mmol/L — ABNORMAL LOW (ref 3.5–5.1)
Sodium: 138 mmol/L (ref 135–145)

## 2019-08-22 LAB — TROPONIN I (HIGH SENSITIVITY)
Troponin I (High Sensitivity): 3 ng/L (ref <18)
Troponin I (High Sensitivity): 3 ng/L (ref <18)

## 2019-08-22 NOTE — ED Provider Notes (Signed)
[facility] EMERGENCY DEPARTMENT Provider Note   CSN: [id number] Arrival date & time: 08/21/19  2240     History   Chief Complaint Chief Complaint  Patient presents with   Chest Pain    HPI Brooke Turner is a 43 y.o. female with a past medical history of GERD, OSA, anxiety, back pain, hypertension, who presents today for evaluation of chest pain.  She was seen in the emergency room on 10/9 for the same.  Since then she has been seen by her PCP and vascular surgery.  She had a DVT ultrasound that was negative in the emergency room and a repeat one with vascular.  She has a telemedicine appointment with cardiology on the 15th.    She states that she has had generalized soreness in her chest for the past 2.5 weeks.  Over this week she has had intermittent sharp chest pains.  She states that the pains have been more frequent which is why she returned to the emergency room today.  She denies any significant shortness of breath.  No diaphoresis, nausea, or vomiting.  Chart review shows that she has reportedly had extensive cardiac work-up with CT coronary 2 months ago with a calcium score of 0 and no visualized abnormalities.       HPI  Past Medical History:  Diagnosis Date   Anxiety    Back pain    Chest pain    a. 09/2014 Echo: EF 60-65%, no rwma, nl valves;  b. 12/2014 Neg ETT.   Essential hypertension    a. 01/2016 Renal duplex: no RAS- ? renal cyst (renal u/s 4/17 - no cyst, nl study); b. 02/2016 24 hr BP Monitor: Mean SBP ~ 138 with Max of 198. Highest pressures noted in evening hours following placement of cuff.   Fibroid uterus    GERD (gastroesophageal reflux disease)    Palpitations    a. 09/2014 nl event monitor.    Patient Active Problem List   Diagnosis Date Noted   OSA (obstructive sleep apnea) 06/11/2019   Encounter for examination following treatment at hospital 04/25/2019   Hypokalemia 04/25/2019   Elevated glucose  04/25/2019   History of posttraumatic stress disorder (PTSD) 12/31/2018   History of panic attacks 12/31/2018   Palpitations    Chest pain of uncertain etiology    Chronic tension-type headache, not intractable 06/10/2015   Dyspnea 03/18/2015   Post-traumatic stress reaction 02/25/2015   Anxiety disorder due to general medical condition with panic attack 02/25/2015   Subserous leiomyoma of uterus 02/25/2015   Anxiety about health 02/25/2015   Deviated nasal septum 12/30/2014   Chronic rhinitis 12/30/2014   Chronic maxillary sinusitis 12/30/2014   Snorings 09/30/2014   Prediabetes 08/26/2014   Acanthosis nigricans 03/19/2014   Vitamin D deficiency 07/15/2013   Large breasts 09/12/2012   Essential hypertension 02/17/2012   Obesity (BMI 30-39.9) previous BMI was over 40. Patient has lost 70 pounds in the last year 02/17/2012   Anxiety disorder 02/17/2012   Chronic back pain 02/17/2012    Past Surgical History:  Procedure Laterality Date   WISDOM TOOTH EXTRACTION       OB History    Gravida  2   Para  0   Term  0   Preterm  0   AB  2   Living  0     SAB  1   TAB  1   Ectopic  0   Multiple  0   Live  Births               Home Medications    Prior to Admission medications   Medication Sig Start Date End Date Taking? Authorizing Provider  acetaminophen (TYLENOL) 500 MG tablet Take 500 mg by mouth every 6 (six) hours as needed (back pain/ cramps).     [provider]  ALPRAZolam (NIRAVAM) 0.25 MG dissolvable tablet Take 0.25 mg by mouth at bedtime as needed for anxiety.    [provider]  atenolol (TENORMIN) 25 MG tablet Take 1/4 tablet every morning 04/25/19   Alvstad, Kristin L, RPH-CPP  blood glucose meter kit and supplies Test blood sugar twice daily. Dx code: R73.09 04/25/19   Maryruth Hancock, MD  busPIRone (BUSPAR) 10 MG tablet Take 10 mg by mouth daily.    [provider]  Cyanocobalamin (VITAMIN B-12  PO) Take 1 tablet by mouth daily.    [provider]  famotidine (PEPCID) 20 MG tablet Take 1 tablet (20 mg total) by mouth 2 (two) times daily. 11/20/18   [name], MD  fluticasone (FLONASE) 50 MCG/ACT nasal spray Place 2 sprays into both nostrils daily. Patient taking differently: Place 2 sprays into both nostrils daily as needed (seasonal allergies).  10/10/17   Tenna Delaine D, PA-C  glucose blood [facility] VERIO) test strip TEST BLOOD SUGAR DAILY 11/16/18   [name], MD  levalbuterol [facility] HFA) 45 MCG/ACT inhaler Inhale 1-2 puffs every 6 hours if needed- rescue inhaler 05/29/19   Deneise Lever, MD  LORazepam (ATIVAN) 0.5 MG tablet Take 1 tablet (0.5 mg total) by mouth every 6 (six) hours as needed for anxiety. Patient taking differently: Take 0.25 mg by mouth 3 (three) times daily as needed for anxiety.  03/19/19   Forrest Moron, MD  Multiple Vitamins-Minerals (MULTIVITAMIN GUMMIES ADULT PO) Take 2 tablets by mouth daily.     [provider]  Norethindrone-Ethinyl Estradiol-Fe Biphas (LO LOESTRIN FE) 1 MG-10 MCG / 10 MCG tablet Take 1 tablet by mouth at bedtime.     [provider]  Jonetta Speak LANCETS 54U MISC USE TO TEST BLOOD SUGAR DAILY 11/16/18   [name], MD  pantoprazole (PROTONIX) 40 MG tablet TAKE 1 TABLET BY MOUTH ONCE DAILY BEFORE BREAKFAST 06/21/19   Thornton Park, MD    Family History Family History  Problem Relation Age of Onset   Seizures Mother    Epilepsy Mother    Cancer Father        Liver   Diabetes Father 25   Hypertension Father    Heart disease Father 31       CEA, LE [place]   Alzheimer's disease Maternal Grandmother    Heart disease Maternal Grandfather    Colon cancer Neg Hx    Breast cancer Neg Hx     Social History Social History   Tobacco Use   Smoking status: Former Smoker    Packs/day: 0.25    Years: 10.00    Pack years: 2.50    Types: Cigarettes    Start date: 06/25/2000    Quit  date: 08/19/2014    Years since quitting: 5.0   Smokeless tobacco: Never Used  Substance Use Topics   Alcohol use: No    Alcohol/week: 0.0 standard drinks   Drug use: No     Allergies   Penicillins, Tamiflu [oseltamivir phosphate], Tinidazole, and Diltiazem   Review of Systems Review of Systems  Constitutional: Negative for chills and  fever.  HENT: Negative for congestion.   Respiratory: Negative for chest tightness, shortness of breath and wheezing.   Cardiovascular: Positive for chest pain. Negative for palpitations and leg swelling.  Gastrointestinal: Negative for abdominal pain, diarrhea, nausea and vomiting.  Genitourinary: Negative for dysuria.  Musculoskeletal: Negative for back pain and neck pain.  Neurological: Negative for weakness and headaches.  All other systems reviewed and are negative.    Physical Exam Updated Vital Signs BP 137/84    Pulse (!) 56    Temp 98.2 F (36.8 C) (Oral)    Resp 18    SpO2 100%   Physical Exam Vitals signs and nursing note reviewed.  Constitutional:      General: She is not in acute distress.    Appearance: She is well-developed. She is not diaphoretic.  HENT:     Head: Normocephalic and atraumatic.  Eyes:     General: No scleral icterus.       Right eye: No discharge.        Left eye: No discharge.     Conjunctiva/sclera: Conjunctivae normal.  Neck:     Musculoskeletal: Normal range of motion.  Cardiovascular:     Rate and Rhythm: Normal rate and regular rhythm.     Pulses:          Radial pulses are 2+ on the right side and 2+ on the left side.       Dorsalis pedis pulses are 2+ on the right side and 2+ on the left side.       Posterior tibial pulses are 2+ on the right side and 2+ on the left side.     Heart sounds: Normal heart sounds.  Pulmonary:     Effort: Pulmonary effort is normal. No tachypnea, accessory muscle usage or respiratory distress.     Breath sounds: Normal breath sounds. No stridor. No decreased  breath sounds, wheezing, rhonchi or rales.  Chest:     Chest wall: Tenderness (She has tenderness to palpation over the left-sided sternocostal joints.  Palpation here both recreates and exacerbates her reported continuous chest pain.) present.  Abdominal:     General: There is no distension.     Palpations: Abdomen is soft.  Musculoskeletal:        General: No deformity.     Right lower leg: She exhibits no tenderness. No edema.     Left lower leg: She exhibits no tenderness. No edema.  Skin:    General: Skin is warm and dry.  Neurological:     General: No focal deficit present.     Mental Status: She is alert.     Cranial Nerves: No cranial nerve deficit.     Motor: No abnormal muscle tone.  Psychiatric:        Mood and Affect: Mood is anxious.        Behavior: Behavior normal.      ED Treatments / Results  Labs (all labs ordered are listed, but only abnormal results are displayed) Labs Reviewed  BASIC METABOLIC PANEL - Abnormal; Notable for the following components:      Result Value   Potassium 3.4 (*)    Creatinine, Ser 1.13 (*)    Calcium 8.8 (*)    GFR calc non Af Amer 59 (*)    All other components within normal limits  CBC  I-STAT BETA HCG BLOOD, ED ([facility], [facility], [facility] ONLY)  TROPONIN I (HIGH SENSITIVITY)  TROPONIN I (HIGH SENSITIVITY)    EKG  EKG Interpretation  Date/Time:  Wednesday August 21 2019 23:06:40 EDT Ventricular Rate:  70 PR Interval:  160 QRS Duration: 80 QT Interval:  396 QTC Calculation: 427 R Axis:   56 Text Interpretation:  Normal sinus rhythm Normal ECG No significant change since last tracing Confirmed by Addison Lank [phone]) on 08/22/2019 4:47:35 AM   Radiology-ultrasound obtained yesterday in vascular office   Dg Chest 2 View  Result Date: 08/21/2019 CLINICAL DATA:  Chest pain EXAM: CHEST - 2 VIEW COMPARISON:  Radiograph 08/16/2019, CT cardiac 06/18/2019 FINDINGS: No consolidation, features of edema, pneumothorax, or effusion.  Pulmonary vascularity is normally distributed. The cardiomediastinal contours are unremarkable. No acute osseous or soft tissue abnormality. IMPRESSION: No acute cardiopulmonary abnormality. Electronically Signed   By: Lovena Le M.D.   On: 08/21/2019 23:48   Vas [place] Lower Extremity Venous Reflux  Result Date: 08/21/2019  Lower Venous Reflux Study Indications: Swelling of ankles..  Comparison Study: No prior exam Performing Technologist: Alvia Grove RVT  Examination Guidelines: A complete evaluation includes B-mode imaging, spectral Doppler, color Doppler, and power Doppler as needed of all accessible portions of each vessel. Bilateral testing is considered an integral part of a complete examination. Limited examinations for reoccurring indications may be performed as noted. The reflux portion of the exam is performed with the patient in reverse Trendelenburg.  +------------------------------+----------+---------+  VEIN DIAMETERS:                Right (cm) Left (cm)  +------------------------------+----------+---------+  GSV at Saphenofemoral junction 0.62       0.65       +------------------------------+----------+---------+  GSV at prox thigh              0.60       0.37       +------------------------------+----------+---------+  GSV at mid thigh               0.35       0.25       +------------------------------+----------+---------+  GSV at distal thigh            0.31       0.22       +------------------------------+----------+---------+  GSV at knee                    0.33       0.24       +------------------------------+----------+---------+  GSV prox calf                  0.22       0.09       +------------------------------+----------+---------+  GSV mid calf                   0.21/0.19  0.24       +------------------------------+----------+---------+  SSV origin                     0.19       0.40       +------------------------------+----------+---------+  SSV prox                       0.16        0.22       +------------------------------+----------+---------+  SSV mid                        0.18       0.22       +------------------------------+----------+---------+  Summary: Bilateral: There is no evidence of deep vein thrombosis in the lower extremity bilaterally from the common femoral vein to the popliteal vein. No reflux in the greater saphenous or small saphenous vein bilaterally.  *See table(s) above for measurements and observations. Electronically signed by Deitra Mayo MD on 08/21/2019 at 4:32:08 PM.    Final     Procedures Procedures (including critical care time)  Medications Ordered in ED Medications  sodium chloride flush (NS) 0.9 % injection 3 mL (has no administration in time range)     Initial Impression / Assessment and Plan / ED Course  I have reviewed the triage vital signs and the nursing notes.  Pertinent labs & imaging results that were available during my care of the patient were reviewed by me and considered in my medical decision making (see chart for details).       Patient presents today for evaluation of chest pain.  This has been intermittent over the past 2 weeks with worsening episodes of brief sharp chest pain.  Troponin x2 normal, with her chest pain going on for approximately 2.5 weeks.  EKG without evidence of ischemia.  Chest x-ray is unremarkable.  She has a outpatient cardiology appointment later today.  Labs are obtained and reviewed without any significant hematologic or electrolyte derangements.  She has had previous extensive cardiac work-up including coronary CT with a score of 0.  She has had 2 DVT ultrasounds in the past week both of which were negative, do not suspect PE.  She has musculoskeletal chest pain that is reproducible on exam.  Recommended that she keep her outpatient cardiology appointment.  Do not suspect ACS, PE, or other serious cause of her chest pain.  Return precautions were discussed with patient who  states their understanding.  At the time of discharge patient denied any unaddressed complaints or concerns.  Patient is agreeable for discharge home.   Final Clinical Impressions(s) / ED Diagnoses   Final diagnoses:  Atypical chest pain    ED Discharge Orders    None       Lorin Glass, PA-C 08/22/19 0545    Merryl Hacker, MD 08/22/19 [phone]

## 2019-08-22 NOTE — Discharge Instructions (Signed)
I suspect that your intermittent sharp chest pains may be from precordial catch syndrome and that your chronic aching pain may be costochondritis or chest wall pain.Marland Kitchen Please keep your appointment with your cardiologist. Please take Ibuprofen (Advil, motrin) and Tylenol (acetaminophen) to relieve your pain.  You may take up to 600 MG (3 pills) of normal strength ibuprofen every 8 hours as needed.  In between doses of ibuprofen you make take tylenol, up to 1,000 mg (two extra strength pills).  Do not take more than 3,000 mg tylenol in a 24 hour period.  Please check all medication labels as many medications such as pain and cold medications may contain tylenol.  Do not drink alcohol while taking these medications.  Do not take other NSAID'S while taking ibuprofen (such as aleve or naproxen).  Please take ibuprofen with food to decrease stomach upset.  If you do not want to take pills you may get voltaren over the counter.

## 2019-08-22 NOTE — Patient Instructions (Signed)
Medication Instructions:  Conitinue current medications  If you need a refill on your cardiac medications before your next appointment, please call your pharmacy.  Labwork: None Ordered   Testing/Procedures: None Ordered  Follow-Up: . Your physician recommends that you schedule a follow-up appointment in: As Needed   At [facility], you and your health needs are our priority.  As part of our continuing mission to provide you with exceptional heart care, we have created designated Provider Care Teams.  These Care Teams include your primary Cardiologist (physician) and Advanced Practice Providers (APPs -  Physician Assistants and Nurse Practitioners) who all work together to provide you with the care you need, when you need it.  Thank you for choosing [facility] at [facility]!!

## 2019-08-23 NOTE — Telephone Encounter (Signed)
Called pt and lvmtcb to schedule hosp f/u

## 2019-09-02 ENCOUNTER — Ambulatory Visit: Payer: Managed Care, Other (non HMO) | Admitting: Neurology

## 2019-09-02 ENCOUNTER — Other Ambulatory Visit: Payer: Self-pay | Admitting: *Deleted

## 2019-09-02 DIAGNOSIS — R7303 Prediabetes: Secondary | ICD-10-CM

## 2019-09-02 MED ORDER — ONETOUCH VERIO VI STRP: ORAL_STRIP | 10 refills | Status: AC

## 2019-09-05 ENCOUNTER — Telehealth: Payer: Self-pay | Admitting: Family Medicine

## 2019-09-05 NOTE — Telephone Encounter (Signed)
Pt requesting lab results to be released to her cardiologist  Lab results dr.lisa corrum  [phone] Please advise

## 2019-09-06 NOTE — Telephone Encounter (Signed)
I faxed to Dr. Orbie Pyo office and got a confirmation after speaking with pt and getting a verbal consent.

## 2019-09-14 ENCOUNTER — Other Ambulatory Visit: Payer: Self-pay | Admitting: Gastroenterology

## 2019-09-18 ENCOUNTER — Encounter: Payer: Managed Care, Other (non HMO) | Admitting: Gastroenterology

## 2019-09-18 ENCOUNTER — Telehealth: Payer: Self-pay | Admitting: Physical Medicine and Rehabilitation

## 2019-09-18 NOTE — Telephone Encounter (Signed)
[employer]- can you take a look at this one. Looks like the referring office put it in as authorized as a covered benefit instead of new so we did not see it.

## 2019-09-18 NOTE — Telephone Encounter (Signed)
Patient left a voicemail message in regards to her referral.  She stated that she has not heard from anyone to schedule an appointment

## 2019-09-20 DIAGNOSIS — I10 Essential (primary) hypertension: Secondary | ICD-10-CM | POA: Diagnosis not present

## 2019-09-20 DIAGNOSIS — R0989 Other specified symptoms and signs involving the circulatory and respiratory systems: Secondary | ICD-10-CM | POA: Diagnosis not present

## 2019-09-20 NOTE — Telephone Encounter (Signed)
Sent referral to Dr. Ernestina Patches to review.

## 2019-09-23 ENCOUNTER — Ambulatory Visit (INDEPENDENT_AMBULATORY_CARE_PROVIDER_SITE_OTHER): Payer: BC Managed Care – PPO | Admitting: Family Medicine

## 2019-09-23 ENCOUNTER — Encounter: Payer: Self-pay | Admitting: Family Medicine

## 2019-09-23 ENCOUNTER — Other Ambulatory Visit: Payer: Self-pay

## 2019-09-23 VITALS — BP 124/62 | HR 75 | Temp 98.1°F | Ht 64.0 in | Wt 175.2 lb

## 2019-09-23 DIAGNOSIS — F418 Other specified anxiety disorders: Secondary | ICD-10-CM | POA: Diagnosis not present

## 2019-09-23 DIAGNOSIS — R002 Palpitations: Secondary | ICD-10-CM

## 2019-09-23 NOTE — Progress Notes (Signed)
Established Patient Office Visit  Subjective:  Patient ID: Brooke Turner, female    DOB: 05/30/1976  Age: 43 y.o. MRN: [id number]  CC:  Chief Complaint  Patient presents with   Heavier breathing    coming back the last 4 days     HPI Brooke Turner presents for   Patient reports that she saw Dr. Edd Arbour for Cardiology and had a renal US The renal US was done but the results have not been reported She has been exercising but a week ago had some difficulty breathing and felt like her heart rate went low as she cooled off.  She states that she had one day where her feeling of fatigue was more pronounced 5 days ago She states that she tries to calm herself down to decrease her anxiety and panic  She states that she wanted to know if the pulse in the 50s are concerning and when she should go to the ER She reports that she feels crackling in her chest that is intermittent   Past Medical History:  Diagnosis Date   Anxiety    Back pain    Chest pain    a. 09/2014 Echo: EF 60-65%, no rwma, nl valves;  b. 12/2014 Neg ETT.   Essential hypertension    a. 01/2016 Renal duplex: no RAS- ? renal cyst (renal u/s 4/17 - no cyst, nl study); b. 02/2016 24 hr BP Monitor: Mean SBP ~ 138 with Max of 198. Highest pressures noted in evening hours following placement of cuff.   Fibroid uterus    GERD (gastroesophageal reflux disease)    Palpitations    a. 09/2014 nl event monitor.    Past Surgical History:  Procedure Laterality Date   WISDOM TOOTH EXTRACTION      Family History  Problem Relation Age of Onset   Seizures Mother    Epilepsy Mother    Cancer Father        Liver   Diabetes Father 1   Hypertension Father    Heart disease Father 53       CEA, LE [place]   Alzheimer's disease Maternal Grandmother    Heart disease Maternal Grandfather    Colon cancer Neg Hx    Breast cancer Neg Hx     Social History   Socioeconomic History   Marital  status: Single    Spouse name: N/A   Number of children: 0   Years of education: 16   Highest education level: Not on file  Occupational History   Occupation: Research scientist (physical sciences): [employer] resource strain: Not on file   Food insecurity    Worry: Not on file    Inability: Not on file   Transportation needs    Medical: Not on file    Non-medical: Not on file  Tobacco Use   Smoking status: Former Smoker    Packs/day: 0.25    Years: 10.00    Pack years: 2.50    Types: Cigarettes    Start date: 06/25/2000    Quit date: 08/19/2014    Years since quitting: 5.0   Smokeless tobacco: Never Used  Substance and Sexual Activity   Alcohol use: No    Alcohol/week: 0.0 standard drinks   Drug use: No   Sexual activity: Not Currently    Partners: Male    Birth control/protection: Pill  Lifestyle   Physical activity    Days per week: Not  on file    Minutes per session: Not on file   Stress: Not on file  Relationships   Social connections    Talks on phone: Not on file    Gets together: Not on file    Attends religious service: Not on file    Active member of club or organization: Not on file    Attends meetings of clubs or organizations: Not on file    Relationship status: Not on file   Intimate partner violence    Fear of current or ex partner: Not on file    Emotionally abused: Not on file    Physically abused: Not on file    Forced sexual activity: Not on file  Other Topics Concern   Not on file  Social History Narrative   Patient lives at home alone .   Patient works full time at [employer]   Education college.   Right handed.   Caffeine none    Outpatient Medications Prior to Visit  Medication Sig Dispense Refill   acetaminophen (TYLENOL) 500 MG tablet Take 500 mg by mouth every 6 (six) hours as needed (back pain/ cramps).      ALPRAZolam (NIRAVAM) 0.25 MG dissolvable tablet Take 0.25 mg by mouth at  bedtime as needed for anxiety.     atenolol (TENORMIN) 25 MG tablet Take 1/4 tablet every morning 180 tablet 3   blood glucose meter kit and supplies Test blood sugar twice daily. Dx code: R73.09 1 each 0   busPIRone (BUSPAR) 10 MG tablet Take 10 mg by mouth daily.     Cyanocobalamin (VITAMIN B-12 PO) Take 1 tablet by mouth daily.     famotidine (PEPCID) 20 MG tablet Take 1 tablet (20 mg total) by mouth 2 (two) times daily. 180 tablet 0   fluticasone (FLONASE) 50 MCG/ACT nasal spray Place 2 sprays into both nostrils daily. (Patient taking differently: Place 2 sprays into both nostrils daily as needed (seasonal allergies). ) 16 g 0   glucose blood (ONETOUCH VERIO) test strip TEST BLOOD SUGAR DAILY 25 each 10   levalbuterol (XOPENEX HFA) 45 MCG/ACT inhaler Inhale 1-2 puffs every 6 hours if needed- rescue inhaler 1 Inhaler 12   LORazepam (ATIVAN) 0.5 MG tablet Take 1 tablet (0.5 mg total) by mouth every 6 (six) hours as needed for anxiety. (Patient taking differently: Take 0.25 mg by mouth 3 (three) times daily as needed for anxiety. ) 15 tablet 0   Multiple Vitamins-Minerals (MULTIVITAMIN GUMMIES ADULT PO) Take 2 tablets by mouth daily.      Norethindrone-Ethinyl Estradiol-Fe Biphas (LO LOESTRIN FE) 1 MG-10 MCG / 10 MCG tablet Take 1 tablet by mouth at bedtime.      ONETOUCH DELICA LANCETS 41O MISC USE TO TEST BLOOD SUGAR DAILY 100 each 3   pantoprazole (PROTONIX) 40 MG tablet TAKE 1 TABLET BY MOUTH ONCE DAILY BEFORE BREAKFAST 90 tablet 0   Facility-Administered Medications Prior to Visit  Medication Dose Route Frequency Provider Last Rate Last Dose   cyanocobalamin ((VITAMIN B-12)) injection 1,000 mcg  1,000 mcg Intramuscular Q30 days [name], MD   1,000 mcg at 10/19/18 8786    Allergies  Allergen Reactions   Penicillins Hives    Has patient had a PCN reaction causing immediate rash, facial/tongue/throat swelling, SOB or lightheadedness with hypotension:Yes Has patient had a  PCN reaction causing severe rash involving mucus membranes or skin necrosis:unsure Has patient had a PCN reaction that required hospitalization: Yes Has patient had a PCN  reaction occurring within the last 10 years: No If all of the above answers are "NO", then may proceed with Cephalosporin use.      Tamiflu [Oseltamivir Phosphate] Nausea And Vomiting    Excessive vomiting   Tinidazole Other (See Comments)    Caused severe abd pain/type of colitis    Diltiazem Other (See Comments)    Dizzy, felt like she was going to pass out Blood pressure went up per patient     ROS Review of Systems Review of Systems  Constitutional: Negative for activity change, appetite change, chills and fever.  HENT: Negative for congestion, nosebleeds, trouble swallowing and voice change.   Respiratory: Negative for cough, shortness of breath and wheezing.   Gastrointestinal: Negative for diarrhea, nausea and vomiting.  Genitourinary: Negative for difficulty urinating, dysuria, flank pain and hematuria.  Musculoskeletal: Negative for back pain, joint swelling and neck pain.  Neurological: Negative for dizziness, speech difficulty, light-headedness and numbness.  See HPI. All other review of systems negative.     Objective:    Physical Exam  BP 124/62    Pulse 75    Temp 98.1 F (36.7 C) (Oral)    Ht 5' 4"  (1.626 m)    Wt 175 lb 3.2 oz (79.5 kg)    SpO2 100%    BMI 30.07 kg/m  Wt Readings from Last 3 Encounters:  09/23/19 175 lb 3.2 oz (79.5 kg)  08/22/19 165 lb (74.8 kg)  08/21/19 168 lb (76.2 kg)   Physical Exam  Constitutional: Oriented to person, place, and time. Appears well-developed and well-nourished.  HENT:  Head: Normocephalic and atraumatic.  Eyes: Conjunctivae and EOM are normal.  Cardiovascular: Normal rate, regular rhythm, normal heart sounds and intact distal pulses.  No murmur heard. Pulmonary/Chest: Effort normal and breath sounds normal. No stridor. No respiratory  distress. Has no wheezes.  Neurological: Is alert and oriented to person, place, and time.  Skin: Skin is warm. Capillary refill takes less than 2 seconds.  Psychiatric: Has a normal mood and affect. Behavior is normal. Judgment and thought content normal.     There are no preventive care reminders to display for this patient.  There are no preventive care reminders to display for this patient.  Lab Results  Component Value Date   TSH 0.600 04/19/2019   Lab Results  Component Value Date   WBC 7.2 08/21/2019   HGB 13.1 08/21/2019   HCT 37.2 08/21/2019   MCV 89.6 08/21/2019   PLT 231 08/21/2019   Lab Results  Component Value Date   NA 138 08/21/2019   K 3.4 (L) 08/21/2019   CO2 22 08/21/2019   GLUCOSE 87 08/21/2019   BUN 10 08/21/2019   CREATININE 1.13 (H) 08/21/2019   BILITOT 1.3 (H) 02/28/2019   ALKPHOS 36 (L) 02/28/2019   AST 16 02/28/2019   ALT 13 02/28/2019   PROT 6.9 02/28/2019   ALBUMIN 4.1 02/28/2019   CALCIUM 8.8 (L) 08/21/2019   ANIONGAP 12 08/21/2019   GFR 70.52 02/28/2019   Lab Results  Component Value Date   CHOL 172 10/25/2016   Lab Results  Component Value Date   HDL 49 10/25/2016   Lab Results  Component Value Date   LDLCALC 106 (H) 10/25/2016   Lab Results  Component Value Date   TRIG 87 10/25/2016   Lab Results  Component Value Date   CHOLHDL 3.5 10/25/2016   Lab Results  Component Value Date   HGBA1C 5.6 04/24/2019  Assessment & Plan:   Problem List Items Addressed This Visit      Other   Anxiety about health   Palpitations - Primary     Patient should follow up with Dr. Edd Arbour She should continue exercise and her ecg and echos were reviewed  No orders of the defined types were placed in this encounter.   Follow-up: No follow-ups on file.    Forrest Moron, MD

## 2019-09-23 NOTE — Patient Instructions (Signed)
° ° ° °  If you have lab work done today you will be contacted with your lab results within the next 2 weeks.  If you have not heard from us then please contact us. The fastest way to get your results is to register for My Chart. ° ° °IF you received an x-ray today, you will receive an invoice from [facility]. Please contact [facility] at [phone] with questions or concerns regarding your invoice.  ° °IF you received labwork today, you will receive an invoice from [facility]. Please contact [facility] at [phone] with questions or concerns regarding your invoice.  ° °Our billing staff will not be able to assist you with questions regarding bills from these companies. ° °You will be contacted with the lab results as soon as they are available. The fastest way to get your results is to activate your My Chart account. Instructions are located on the last page of this paperwork. If you have not heard from us regarding the results in 2 weeks, please contact this office. °  ° ° ° °

## 2019-09-27 DIAGNOSIS — I10 Essential (primary) hypertension: Secondary | ICD-10-CM | POA: Diagnosis not present

## 2019-09-27 DIAGNOSIS — M5414 Radiculopathy, thoracic region: Secondary | ICD-10-CM | POA: Diagnosis not present

## 2019-09-27 DIAGNOSIS — Z6830 Body mass index (BMI) 30.0-30.9, adult: Secondary | ICD-10-CM | POA: Diagnosis not present

## 2019-09-27 DIAGNOSIS — M5124 Other intervertebral disc displacement, thoracic region: Secondary | ICD-10-CM | POA: Diagnosis not present

## 2019-10-01 ENCOUNTER — Ambulatory Visit (INDEPENDENT_AMBULATORY_CARE_PROVIDER_SITE_OTHER): Payer: BC Managed Care – PPO | Admitting: Physical Medicine and Rehabilitation

## 2019-10-01 ENCOUNTER — Encounter: Payer: Self-pay | Admitting: Physical Medicine and Rehabilitation

## 2019-10-01 ENCOUNTER — Other Ambulatory Visit: Payer: Self-pay

## 2019-10-01 VITALS — BP 137/88 | HR 61

## 2019-10-01 DIAGNOSIS — M546 Pain in thoracic spine: Secondary | ICD-10-CM

## 2019-10-01 DIAGNOSIS — M940 Chondrocostal junction syndrome [Tietze]: Secondary | ICD-10-CM

## 2019-10-01 DIAGNOSIS — Z113 Encounter for screening for infections with a predominantly sexual mode of transmission: Secondary | ICD-10-CM | POA: Diagnosis not present

## 2019-10-01 DIAGNOSIS — Z3202 Encounter for pregnancy test, result negative: Secondary | ICD-10-CM | POA: Diagnosis not present

## 2019-10-01 DIAGNOSIS — F411 Generalized anxiety disorder: Secondary | ICD-10-CM

## 2019-10-01 DIAGNOSIS — Z01419 Encounter for gynecological examination (general) (routine) without abnormal findings: Secondary | ICD-10-CM | POA: Diagnosis not present

## 2019-10-01 DIAGNOSIS — G8929 Other chronic pain: Secondary | ICD-10-CM

## 2019-10-01 DIAGNOSIS — R079 Chest pain, unspecified: Secondary | ICD-10-CM

## 2019-10-01 DIAGNOSIS — Z6830 Body mass index (BMI) 30.0-30.9, adult: Secondary | ICD-10-CM | POA: Diagnosis not present

## 2019-10-01 NOTE — Progress Notes (Signed)
  Numeric Pain Rating Scale and Functional Assessment Average Pain 8 Pain Right Now 3 My pain is intermittent, burning, dull and aching Pain is worse with: lying on left side Pain improves with: medication   In the last MONTH (on 0-10 scale) has pain interfered with the following?  1. General activity like being  able to carry out your everyday physical activities such as walking, climbing stairs, carrying groceries, or moving a chair?  Rating(6)  2. Relation with others like being able to carry out your usual social activities and roles such as  activities at home, at work and in your community. Rating(6)  3. Enjoyment of life such that you have  been bothered by emotional problems such as feeling anxious, depressed or irritable?  Rating(10)

## 2019-10-02 ENCOUNTER — Encounter: Payer: Self-pay | Admitting: Physical Medicine and Rehabilitation

## 2019-10-02 DIAGNOSIS — M940 Chondrocostal junction syndrome [Tietze]: Secondary | ICD-10-CM | POA: Insufficient documentation

## 2019-10-02 NOTE — Addendum Note (Signed)
Addended by: Betsey Holiday on: 10/02/2019 08:38 AM   Modules accepted: Orders

## 2019-10-02 NOTE — Progress Notes (Signed)
Brooke Turner - 43 y.o. female MRN [id number]  Date of birth: 02-02-1976  Office Visit Note: Visit Date: 10/01/2019 PCP: Forrest Moron, MD Referred by: Forrest Moron, MD  Subjective: Chief Complaint  Patient presents with   Chest - Pain   HPI: Brooke Turner is a 43 y.o. female who comes in today At the request of her primary care provider Delia Chimes, MD for evaluation management of left-sided atypical chest pain and possible costochondral junction pain syndrome.  Patient reports a chronic history with apparent worsening over time of left-sided anterior chest pain over the left chest wall and pectoralis area which is seemingly worse with laying on the left side and leaning forward.  Leaning forward causes a burning type pain.  She reports this is intermittent burning and dull sometimes aching pain.  No right-sided complaints.  She has no symptoms down the arms she does have some neck and upper back pain which is being treated by Dr. Earnie Larsson at [facility].  She is also seen Dr. Marlaine Hind and she reports that they were looking at doing an injection of the thoracic spine for her upper back pain.  She reports imaging showing disc problems at T7.  I do not see any MRIs at least on the system that I can see of the thoracic or cervical spine.  She has mention to them about the chest pain and they felt like that was not caused by her spine do to the location of the chest pain.  She has had numerous studies done to rule out cardiac issues.  Going back as far as 2018 on the chart she has had multiple emergency department visits as well as visits with her primary care physicians and cardiologist for work-up.  She does suffer from generalized anxiety does take some Ativan and Xanax as well as BuSpar.  She has had some panic attacks in the past.  She reports mild relief if she takes Tylenol and ibuprofen at the same time.  She is tried  topical treatments with no relief.  She has not tried lidocaine patches.  Her primary care physician suggested this may be costochondritis.  The patient states she is never heard of that and she did not know that her primary care physician had said that.  Patient does seem to be fairly anxious and troubled by not being able to find a diagnosis for her chest pain.  She has not had any interventional procedure or injection for this area.  She has not had focused physical therapy.  Review of Systems  Constitutional: Negative for chills, fever, malaise/fatigue and weight loss.  HENT: Negative for hearing loss and sinus pain.   Eyes: Negative for blurred vision, double vision and photophobia.  Respiratory: Negative for cough and shortness of breath.   Cardiovascular: Negative for chest pain, palpitations and leg swelling.  Gastrointestinal: Negative for abdominal pain, nausea and vomiting.  Genitourinary: Negative for flank pain.  Musculoskeletal: Positive for back pain. Negative for myalgias.       Left chest wall pain  Skin: Negative for itching and rash.  Neurological: Negative for tingling, tremors, focal weakness and weakness.  Endo/Heme/Allergies: Negative.   Psychiatric/Behavioral: Negative for depression.  All other systems reviewed and are negative.  Otherwise per HPI.  Assessment & Plan: Visit Diagnoses:  1. Chest pain of uncertain etiology   2. Costochondral junction syndrome   3. Chronic midline thoracic back pain   4. Generalized  anxiety disorder     Plan: Findings:  Exam and clinical course consistent with a costochondral junction pain syndrome.  The length of time this is been going on I hate to call it a costochondritis.  She has underlying generalized anxiety that seems to be prevalent and I think that is obviously an underlying source of exacerbation of the pain.  She is concurrently being treated by Dr. Marlaine Hind who is a physiatrist with the spine surgeons.  Exam shows  tenderness along the costochondral margins but is also in the musculature area.  Seem to be some focal trigger points.  I think the right answer is a trial of focused physical therapy to get her on a course of scapulothoracic exercises and stretching.  They can also do modalities in this area including ultrasound heat etc.  Could consider dry needling.  We have also asked her to try Lidoderm patches over-the-counter.  If she is not getting any relief with that or its very short-lived would consider costochondral injections of the upper sternal area.  She can also talk to Dr. Brien Few since she is already seeing him as well and I think he might offer some insight as well.  Would also recommend to the primary care physician Dr. Nolon Rod to look at pain psychology with coping strategy and anxiety management which is really already being done but I think this is paramount for her in the future.  I did try to reassure her at length that at least at this point it is pretty clear that this is not a cardiac origin and that should provide some anxiety relief.  Prescription was written for [facility] physical therapy.  Greater than 50% of this visit (total duration of visit was over 45 minutes) was spent in counseling and coordination of care discussing thoracic pain and nerve levels as well as exercise and costochondritis anatomy.    Meds & Orders: No orders of the defined types were placed in this encounter.  No orders of the defined types were placed in this encounter.   Follow-up: Return in about 4 weeks (around 10/29/2019).   Procedures: No procedures performed  No notes on file   Clinical History: CHEST - 2 VIEW  COMPARISON: Radiograph 08/16/2019, CT cardiac 06/18/2019  FINDINGS: No consolidation, features of edema, pneumothorax, or effusion. Pulmonary vascularity is normally distributed. The cardiomediastinal contours are unremarkable. No acute osseous or soft  tissue abnormality.  IMPRESSION: No acute cardiopulmonary abnormality.   Electronically Signed By: Lovena Le M.D. On: 08/21/2019 23:48 ---- CT ANGIOGRAPHY CHEST WITH CONTRAST  TECHNIQUE: Multidetector CT imaging of the chest was performed using the standard protocol during bolus administration of intravenous contrast. Multiplanar CT image reconstructions and MIPs were obtained to evaluate the vascular anatomy.  CONTRAST:  21mL OMNIPAQUE IOHEXOL 350 MG/ML SOLN  COMPARISON:  CTA chest dated October 30, 2018.  FINDINGS: Cardiovascular: Satisfactory opacification of the pulmonary arteries to the segmental level. No evidence of pulmonary embolism. Normal heart size. No pericardial effusion. No thoracic aortic aneurysm or dissection.  Mediastinum/Nodes: No enlarged mediastinal, hilar, or axillary lymph nodes. Thyroid gland, trachea, and esophagus demonstrate no significant findings.  Lungs/Pleura: No focal consolidation, pleural effusion, or pneumothorax. Unchanged 3 mm pulmonary nodules in the right upper and middle lobes (series 7, images 51 and 85).  Upper Abdomen: No acute abnormality.  Musculoskeletal: No chest wall abnormality. No acute or significant osseous findings.  Review of the MIP images confirms the above findings.  IMPRESSION: 1. No evidence of pulmonary  embolism. No acute intrathoracic process. 2. Unchanged 3 mm pulmonary nodules in the right upper and middle lobes. No follow-up needed if patient is low-risk (and has no known or suspected primary neoplasm). Non-contrast chest CT can be considered in 8 months if patient is high-risk. This recommendation follows the consensus statement: Guidelines for Management of Incidental Pulmonary Nodules Detected on CT Images: From the Fleischner Society 2017; Radiology 2017; 284:228-243.   Electronically Signed   By: Titus Dubin M.D.   On: 03/09/2019 06:04   She reports that she quit  smoking about 5 years ago. Her smoking use included cigarettes. She started smoking about 19 years ago. She has a 2.50 pack-year smoking history. She has never used smokeless tobacco.  Recent Labs    10/19/18 1716 04/24/19 1651  HGBA1C 5.7* 5.6    Objective:  VS:  HT:     WT:    BMI:      BP:137/88   HR:61bpm   TEMP: ( )   RESP:  Physical Exam Vitals signs and nursing note reviewed.  Constitutional:      General: She is not in acute distress.    Appearance: Normal appearance. She is well-developed. She is obese.  HENT:     Head: Normocephalic and atraumatic.     Nose: Nose normal.     Mouth/Throat:     Mouth: Mucous membranes are moist.     Pharynx: Oropharynx is clear.  Eyes:     Conjunctiva/sclera: Conjunctivae normal.     Pupils: Pupils are equal, round, and reactive to light.  Neck:     Musculoskeletal: Normal range of motion and neck supple.  Cardiovascular:     Rate and Rhythm: Regular rhythm.  Pulmonary:     Effort: Pulmonary effort is normal. No respiratory distress.  Abdominal:     General: There is no distension.     Palpations: Abdomen is soft.     Tenderness: There is no guarding.  Musculoskeletal:        General: No swelling, deformity or signs of injury.     Right lower leg: No edema.     Left lower leg: No edema.     Comments: Examination of the left neck shoulder and chest region showed tenderness to palpation along the costochondral margins but also along the ribs and chest musculature in general.  Very tender to even light palpation.  No allodynia.  She has no shoulder impingement's or impingement signs.  No pain over the [facility] joint.  She has good strength in the upper extremities.  Skin:    General: Skin is warm and dry.     Findings: No erythema or rash.  Neurological:     General: No focal deficit present.     Mental Status: She is alert and oriented to person, place, and time.     Motor: No abnormal muscle tone.     Coordination: Coordination normal.      Gait: Gait normal.  Psychiatric:        Mood and Affect: Mood normal.        Behavior: Behavior normal.        Thought Content: Thought content normal.     Ortho Exam Imaging: No results found.  Past Medical/Family/Surgical/Social History: Medications & Allergies reviewed per EMR, new medications updated. Patient Active Problem List   Diagnosis Date Noted   Costochondral junction syndrome 10/02/2019   OSA (obstructive sleep apnea) 06/11/2019   Encounter for examination following treatment at hospital 04/25/2019  Hypokalemia 04/25/2019   Elevated glucose 04/25/2019   History of posttraumatic stress disorder (PTSD) 12/31/2018   History of panic attacks 12/31/2018   Palpitations    Chest pain of uncertain etiology    Chronic tension-type headache, not intractable 06/10/2015   Dyspnea 03/18/2015   Post-traumatic stress reaction 02/25/2015   Anxiety disorder due to general medical condition with panic attack 02/25/2015   Subserous leiomyoma of uterus 02/25/2015   Anxiety about health 02/25/2015   Deviated nasal septum 12/30/2014   Chronic rhinitis 12/30/2014   Chronic maxillary sinusitis 12/30/2014   Snorings 09/30/2014   Prediabetes 08/26/2014   Acanthosis nigricans 03/19/2014   Vitamin D deficiency 07/15/2013   Large breasts 09/12/2012   Essential hypertension 02/17/2012   Obesity (BMI 30-39.9) previous BMI was over 40. Patient has lost 70 pounds in the last year 02/17/2012   Anxiety disorder 02/17/2012   Chronic back pain 02/17/2012   Past Medical History:  Diagnosis Date   Anxiety    Back pain    Chest pain    a. 09/2014 Echo: EF 60-65%, no rwma, nl valves;  b. 12/2014 Neg ETT.   Essential hypertension    a. 01/2016 Renal duplex: no RAS- ? renal cyst (renal u/s 4/17 - no cyst, nl study); b. 02/2016 24 hr BP Monitor: Mean SBP ~ 138 with Max of 198. Highest pressures noted in evening hours following placement of cuff.   Fibroid uterus     GERD (gastroesophageal reflux disease)    Palpitations    a. 09/2014 nl event monitor.   Family History  Problem Relation Age of Onset   Seizures Mother    Epilepsy Mother    Cancer Father        Liver   Diabetes Father 85   Hypertension Father    Heart disease Father 65       CEA, LE Stenting   Alzheimer's disease Maternal Grandmother    Heart disease Maternal Grandfather    Colon cancer Neg Hx    Breast cancer Neg Hx    Past Surgical History:  Procedure Laterality Date   WISDOM TOOTH EXTRACTION     Social History   Occupational History   Occupation: Research scientist (physical sciences): [employer]  Tobacco Use   Smoking status: Former Smoker    Packs/day: 0.25    Years: 10.00    Pack years: 2.50    Types: Cigarettes    Start date: 06/25/2000    Quit date: 08/19/2014    Years since quitting: 5.1   Smokeless tobacco: Never Used  Substance and Sexual Activity   Alcohol use: No    Alcohol/week: 0.0 standard drinks   Drug use: No   Sexual activity: Not Currently    Partners: Male    Birth control/protection: Pill

## 2019-10-08 ENCOUNTER — Other Ambulatory Visit: Payer: Self-pay | Admitting: Gastroenterology

## 2019-10-09 ENCOUNTER — Other Ambulatory Visit: Payer: Self-pay

## 2019-10-09 DIAGNOSIS — Z20822 Contact with and (suspected) exposure to covid-19: Secondary | ICD-10-CM

## 2019-10-11 ENCOUNTER — Ambulatory Visit: Payer: Self-pay

## 2019-10-11 ENCOUNTER — Ambulatory Visit: Payer: Self-pay | Admitting: *Deleted

## 2019-10-11 LAB — NOVEL CORONAVIRUS, NAA: SARS-CoV-2, NAA: NOT DETECTED

## 2019-10-11 NOTE — Telephone Encounter (Signed)
   Reason for Disposition . General information question, no triage required and triager able to answer question  Answer Assessment - Initial Assessment Questions 1. REASON FOR CALL or QUESTION: "What is your reason for calling today?" or "How can I best help you?" or "What question do you have that I can help answer?"     She was wondering if the COVID-19 test where they don't insert the swab all the way back into the nose was accurate.    This time they only circled the swab around just inside my nose so I wondered if it would be just as accurate?  I let her know it depended on the type of test they were using.   There were different kinds of tests.   Some require the swab be inserted all the way back into the nose and then there are those where you can swab just inside the nose.    She thanked me for my help.  Protocols used: INFORMATION ONLY CALL-A-AH

## 2019-10-11 NOTE — Telephone Encounter (Signed)
Incoming call from Patient  stated that she was tested for covid-19 awaiting  Test results .  Questions the procedure  States the the swab just went half way .  Wants to to make sure they got enough specimen.  Assured Patient of the process.  Encouraged Patient to call back if questions arise.

## 2019-10-15 DIAGNOSIS — M546 Pain in thoracic spine: Secondary | ICD-10-CM | POA: Diagnosis not present

## 2019-10-15 DIAGNOSIS — M6281 Muscle weakness (generalized): Secondary | ICD-10-CM | POA: Diagnosis not present

## 2019-10-15 DIAGNOSIS — M545 Low back pain: Secondary | ICD-10-CM | POA: Diagnosis not present

## 2019-10-15 DIAGNOSIS — R079 Chest pain, unspecified: Secondary | ICD-10-CM | POA: Diagnosis not present

## 2019-10-17 ENCOUNTER — Telehealth: Payer: Self-pay | Admitting: Family Medicine

## 2019-10-17 NOTE — Telephone Encounter (Signed)
Pt having mucus discharge when she is sleeping and seems to be going into her mouth. She would like to know if there is anything that can be prescribed without an appt.  Please advise [phone]

## 2019-10-22 DIAGNOSIS — R079 Chest pain, unspecified: Secondary | ICD-10-CM | POA: Diagnosis not present

## 2019-10-22 DIAGNOSIS — M546 Pain in thoracic spine: Secondary | ICD-10-CM | POA: Diagnosis not present

## 2019-10-22 DIAGNOSIS — M545 Low back pain: Secondary | ICD-10-CM | POA: Diagnosis not present

## 2019-10-22 DIAGNOSIS — M6281 Muscle weakness (generalized): Secondary | ICD-10-CM | POA: Diagnosis not present

## 2019-10-22 NOTE — Telephone Encounter (Signed)
Lvm to return office call. Dgaddy, CMA

## 2019-10-23 ENCOUNTER — Ambulatory Visit (INDEPENDENT_AMBULATORY_CARE_PROVIDER_SITE_OTHER): Payer: BC Managed Care – PPO

## 2019-10-23 ENCOUNTER — Other Ambulatory Visit: Payer: Self-pay

## 2019-10-23 ENCOUNTER — Encounter: Payer: Self-pay | Admitting: Podiatry

## 2019-10-23 ENCOUNTER — Ambulatory Visit: Payer: BC Managed Care – PPO | Admitting: Podiatry

## 2019-10-23 VITALS — BP 157/87

## 2019-10-23 DIAGNOSIS — M2011 Hallux valgus (acquired), right foot: Secondary | ICD-10-CM

## 2019-10-23 DIAGNOSIS — M2042 Other hammer toe(s) (acquired), left foot: Secondary | ICD-10-CM

## 2019-10-23 DIAGNOSIS — M2012 Hallux valgus (acquired), left foot: Secondary | ICD-10-CM | POA: Diagnosis not present

## 2019-10-23 DIAGNOSIS — M2041 Other hammer toe(s) (acquired), right foot: Secondary | ICD-10-CM | POA: Diagnosis not present

## 2019-10-23 NOTE — Patient Instructions (Addendum)
Bunion  A bunion is a bump on the base of the big toe that forms when the bones of the big toe joint move out of position. Bunions may be small at first, but they often get larger over time. They can make walking painful. What are the causes? A bunion may be caused by:  Wearing narrow or pointed shoes that force the big toe to press against the other toes.  Abnormal foot development that causes the foot to roll inward (pronate).  Changes in the foot that are caused by certain diseases, such as rheumatoid arthritis or polio.  A foot injury. What increases the risk? The following factors may make you more likely to develop this condition:  Wearing shoes that squeeze the toes together.  Having certain diseases, such as: ? Rheumatoid arthritis. ? Polio. ? Cerebral palsy.  Having family members who have bunions.  Being born with a foot deformity, such as flat feet or low arches.  Doing activities that put a lot of pressure on the feet, such as ballet dancing. What are the signs or symptoms? The main symptom of a bunion is a noticeable bump on the big toe. Other symptoms may include:  Pain.  Swelling around the big toe.  Redness and inflammation.  Thick or hardened skin on the big toe or between the toes.  Stiffness or loss of motion in the big toe.  Trouble with walking. How is this diagnosed? A bunion may be diagnosed based on your symptoms, medical history, and activities. You may have tests, such as:  X-rays. These allow your health care provider to check the position of the bones in your foot and look for damage to your joint. They also help your health care provider determine the severity of your bunion and the best way to treat it.  Joint aspiration. In this test, a sample of fluid is removed from the toe joint. This test may be done if you are in a lot of pain. It helps rule out diseases that cause painful swelling of the joints, such as arthritis. How is this  treated? Treatment depends on the severity of your symptoms. The goal of treatment is to relieve symptoms and prevent the bunion from getting worse. Your health care provider may recommend:  Wearing shoes that have a wide toe box.  Using bunion pads to cushion the affected area.  Taping your toes together to keep them in a normal position.  Placing a device inside your shoe (orthotics) to help reduce pressure on your toe joint.  Taking medicine to ease pain, inflammation, and swelling.  Applying heat or ice to the affected area.  Doing stretching exercises.  Surgery to remove scar tissue and move the toes back into their normal position. This treatment is rare. Follow these instructions at home: Managing pain, stiffness, and swelling   If directed, put ice on the painful area: ? Put ice in a plastic bag. ? Place a towel between your skin and the bag. ? Leave the ice on for 20 minutes, 2-3 times a day. Activity   If directed, apply heat to the affected area before you exercise. Use the heat source that your health care provider recommends, such as a moist heat pack or a heating pad. ? Place a towel between your skin and the heat source. ? Leave the heat on for 20-30 minutes. ? Remove the heat if your skin turns bright red. This is especially important if you are unable to feel pain,   heat, or cold. You may have a greater risk of getting burned.  Do exercises as told by your health care provider. General instructions  Support your toe joint with proper footwear, shoe padding, or taping as told by your health care provider.  Take over-the-counter and prescription medicines only as told by your health care provider.  Keep all follow-up visits as told by your health care provider. This is important. Contact a health care provider if your symptoms:  Get worse.  Do not improve in 2 weeks. Get help right away if you have:  Severe pain and trouble with walking. Summary  A  bunion is a bump on the base of the big toe that forms when the bones of the big toe joint move out of position.  Bunions can make walking painful.  Treatment depends on the severity of your symptoms.  Support your toe joint with proper footwear, shoe padding, or taping as told by your health care provider. This information is not intended to replace advice given to you by your health care provider. Make sure you discuss any questions you have with your health care provider. Document Released: 10/24/2005 Document Revised: 04/30/2018 Document Reviewed: 03/06/2018 Elsevier Patient Education  [place] Toe  Hammer toe is a change in the shape (a deformity) of your toe. The deformity causes the middle joint of your toe to stay bent. This causes pain, especially when you are wearing shoes. Hammer toe starts gradually. At first, the toe can be straightened. Gradually over time, the deformity becomes stiff and permanent. Early treatments to keep the toe straight may relieve pain. As the deformity becomes stiff and permanent, surgery may be needed to straighten the toe. What are the causes? Hammer toe is caused by abnormal bending of the toe joint that is closest to your foot. It happens gradually over time. This pulls on the muscles and connections (tendons) of the toe joint, making them weak and stiff. It is often related to wearing shoes that are too short or narrow and do not let your toes straighten. What increases the risk? You may be at greater risk for hammer toe if you:  Are female.  Are older.  Wear shoes that are too small.  Wear high-heeled shoes that pinch your toes.  Are a Engineer, mining.  Have a second toe that is longer than your big toe (first toe).  Injure your foot or toe.  Have arthritis.  Have a family history of hammer toe.  Have a nerve or muscle disorder. What are the signs or symptoms? The main symptoms of this condition are pain and deformity  of the toe. The pain is worse when wearing shoes, walking, or running. Other symptoms may include:  Corns or calluses over the bent part of the toe or between the toes.  Redness and a burning feeling on the toe.  An open sore that forms on the top of the toe.  Not being able to straighten the toe. How is this diagnosed? This condition is diagnosed based on your symptoms and a physical exam. During the exam, your health care provider will try to straighten your toe to see how stiff the deformity is. You may also have tests, such as:  A blood test to check for rheumatoid arthritis.  An X-ray to show how severe the deformity is. How is this treated? Treatment for this condition will depend on how stiff the deformity is. Surgery is often needed. However, sometimes a  hammer toe can be straightened without surgery. Treatments that do not involve surgery include:  Taping the toe into a straightened position.  Using pads and cushions to protect the toe (orthotics).  Wearing shoes that provide enough room for the toes.  Doing toe-stretching exercises at home.  Taking an NSAID to reduce pain and swelling. If these treatments do not help or the toe cannot be straightened, surgery is the next option. The most common surgeries used to straighten a hammer toe include:  Arthroplasty. In this procedure, part of the joint is removed, and that allows the toe to straighten.  Fusion. In this procedure, cartilage between the two bones of the joint is taken out and the bones are fused together into one longer bone.  Implantation. In this procedure, part of the bone is removed and replaced with an implant to let the toe move again.  Flexor tendon transfer. In this procedure, the tendons that curl the toes down (flexor tendons) are repositioned. Follow these instructions at home:  Take over-the-counter and prescription medicines only as told by your health care provider.  Do toe straightening and  stretching exercises as told by your health care provider.  Keep all follow-up visits as told by your health care provider. This is important. How is this prevented?  Wear shoes that give your toes enough room and do not cause pain.  Do not wear high-heeled shoes. Contact a health care provider if:  Your pain gets worse.  Your toe becomes red or swollen.  You develop an open sore on your toe. This information is not intended to replace advice given to you by your health care provider. Make sure you discuss any questions you have with your health care provider. Document Released: 10/21/2000 Document Revised: 10/06/2017 Document Reviewed: 02/17/2016 Elsevier Patient Education  2020 [employer].

## 2019-10-24 DIAGNOSIS — R5383 Other fatigue: Secondary | ICD-10-CM | POA: Diagnosis not present

## 2019-10-24 DIAGNOSIS — R110 Nausea: Secondary | ICD-10-CM | POA: Diagnosis not present

## 2019-10-24 NOTE — Progress Notes (Signed)
Subjective:   Patient ID: Brooke Turner, female   DOB: 43 y.o.   MRN: [id number]   HPI Patient presents with bunion deformity bilateral and keratotic lesions fifth digits both feet that are chronically painful and she states she tries to trim them and have them without relief and she wants to know what possibly could be done stating the right bunion at times is more bothersome than the left.  Patient does not smoke likes to be active   Review of Systems  All other systems reviewed and are negative.       Objective:  Physical Exam Vitals and nursing note reviewed.  Constitutional:      Appearance: She is well-developed.  Pulmonary:     Effort: Pulmonary effort is normal.  Musculoskeletal:        General: Normal range of motion.  Skin:    General: Skin is warm.  Neurological:     Mental Status: She is alert.     Neurovascular status found to be intact muscle strength found to be adequate range of motion within normal limits.  Patient is found to have significant keratotic lesion digit 5 both feet painful with rotated fifth digit and is noted to have mild to moderate bunion deformity right over left with symptomatic shoe gear at times.  States gradually becoming more of an issue and patient has good digital perfusion well oriented x3     Assessment:  Chronic hammertoe deformity with adductovarus deformity fifth digit bilateral along with moderate bunion deformity right over left     Plan:  H&P reviewed conditions debrided lesions fifth digit and applied padding to take pressure off the toes.  I discussed arthroplasty possible bunion correction and patient at this point will try the cushioning and see how Eduard Clos does for a month and we can decide if anything else needs to be done.  Reappoint 4 weeks to reevaluate or earlier if needed  X-rays indicate that there is moderate bunion deformity bilateral with significant rotation digit 5 bilateral

## 2019-10-25 DIAGNOSIS — R5383 Other fatigue: Secondary | ICD-10-CM | POA: Diagnosis not present

## 2019-10-25 DIAGNOSIS — I10 Essential (primary) hypertension: Secondary | ICD-10-CM | POA: Diagnosis not present

## 2019-10-25 DIAGNOSIS — F418 Other specified anxiety disorders: Secondary | ICD-10-CM | POA: Diagnosis not present

## 2019-10-25 DIAGNOSIS — R002 Palpitations: Secondary | ICD-10-CM | POA: Insufficient documentation

## 2019-10-25 DIAGNOSIS — E559 Vitamin D deficiency, unspecified: Secondary | ICD-10-CM | POA: Diagnosis not present

## 2019-10-25 DIAGNOSIS — R110 Nausea: Secondary | ICD-10-CM | POA: Diagnosis not present

## 2019-10-29 DIAGNOSIS — I10 Essential (primary) hypertension: Secondary | ICD-10-CM | POA: Diagnosis not present

## 2019-10-29 DIAGNOSIS — Z309 Encounter for contraceptive management, unspecified: Secondary | ICD-10-CM | POA: Diagnosis not present

## 2019-11-13 ENCOUNTER — Other Ambulatory Visit: Payer: Self-pay

## 2019-11-13 ENCOUNTER — Other Ambulatory Visit (INDEPENDENT_AMBULATORY_CARE_PROVIDER_SITE_OTHER): Payer: BC Managed Care – PPO

## 2019-11-13 ENCOUNTER — Ambulatory Visit: Payer: BC Managed Care – PPO | Admitting: Neurology

## 2019-11-13 ENCOUNTER — Encounter: Payer: Self-pay | Admitting: Neurology

## 2019-11-13 VITALS — HR 80 | Ht 64.0 in | Wt 179.0 lb

## 2019-11-13 DIAGNOSIS — R29898 Other symptoms and signs involving the musculoskeletal system: Secondary | ICD-10-CM

## 2019-11-13 DIAGNOSIS — R2689 Other abnormalities of gait and mobility: Secondary | ICD-10-CM

## 2019-11-13 DIAGNOSIS — R42 Dizziness and giddiness: Secondary | ICD-10-CM | POA: Diagnosis not present

## 2019-11-13 NOTE — Patient Instructions (Addendum)
1. Bloodwork for TSH, B12, B1, ESR, CRP, copper, SPEP, IFE, folate, ANA, SS-A, SS-B  2. Schedule EMG/NCV of the left arm and leg  3. Refer for Vestibular therapy. [facility] will call you with an appointment date and time.  4. Increase fluid intake, avoid dehydration, increase isometric exercises  5. Follow-up in 6 months, call for any changes

## 2019-11-13 NOTE — Progress Notes (Signed)
NEUROLOGY CONSULTATION NOTE  Brooke Turner MRN: [id number] DOB: [birth year]  Referring provider: Dr. Delia Chimes Primary care provider: Dr. [name]  Reason for consult:  vertigo  Dear Dr Nolon Rod:  Thank you for your kind referral of Brooke Turner for consultation of the above symptoms. Although her history is well known to you, please allow me to reiterate it for the purpose of our medical record. She is alone in the office today. Records and images were personally reviewed where available.   HISTORY OF PRESENT ILLNESS: This is a pleasant 44 year old right-handed woman with a history of anxiety presenting for evaluation of dizziness. She was seen one time in our office in 2016 for headaches and left leg paresthesias. MRI brain in 2015 reportedly normal. She states today that the dizziness is a "minor part of it." She has seen Cardiology and has had an extensive normal workup, last recommendation was to go back to Neurology. She has had dizziness for a while, but last year was told she had vertigo when she had an episode while working out at [employer]. She bent down and got very dizzy with a spinning sensation. She went to the ER where she recalls looking to the right provoking vertigo. She was given meclizine which she took for a week and the spinning went away mostly. She still has some vertigo when she tilts her head back or bends over. She would get a rush/pressure sensation and feel lightheaded. There is no headache but she states it feels like her head is going to pop. Sometimes she would be sitting and feel like her head is "blowing up" for a few seconds then resolves. Her main concern is balance. For the past few months, she would be standing at the counter and have to look down because it feels like the floor is moving and she is on a boat. She grabs on to things, no falls. She loses her grip and drops things with her left hand. She has occasional paresthesias  in her left hand and both feet when standing/sitting for prolonged periods. She has back pain. When she wakes up, she has noticed muscle spasms on her face, feeling like her face is stuck on the left side (no pain). She does not have a lot of headaches, no diplopia, hearing loss. She has occasional high-pitched tinnitus L>R. No head injuries or recent infections. No family history of similar symptoms.    PAST MEDICAL HISTORY: Past Medical History:  Diagnosis Date  . Anxiety   . Back pain   . Chest pain    a. 09/2014 Echo: EF 60-65%, no rwma, nl valves;  b. 12/2014 Neg ETT.  . Essential hypertension    a. 01/2016 Renal duplex: no RAS- ? renal cyst (renal u/s 4/17 - no cyst, nl study); b. 02/2016 24 hr BP Monitor: Mean SBP ~ 138 with Max of 198. Highest pressures noted in evening hours following placement of cuff.  . Fibroid uterus   . GERD (gastroesophageal reflux disease)   . Palpitations    a. 09/2014 nl event monitor.    PAST SURGICAL HISTORY: Past Surgical History:  Procedure Laterality Date  . WISDOM TOOTH EXTRACTION      MEDICATIONS: Current Outpatient Medications on File Prior to Visit  Medication Sig Dispense Refill  . acetaminophen (TYLENOL) 500 MG tablet Take 500 mg by mouth every 6 (six) hours as needed (back pain/ cramps).     . ALPRAZolam (NIRAVAM) 0.25 MG dissolvable  tablet Take 0.25 mg by mouth at bedtime as needed for anxiety.    Marland Kitchen atenolol (TENORMIN) 25 MG tablet Take 1/4 tablet every morning 180 tablet 3  . blood glucose meter kit and supplies Test blood sugar twice daily. Dx code: R73.09 1 each 0  . busPIRone (BUSPAR) 10 MG tablet Take 10 mg by mouth daily.    . Cyanocobalamin (VITAMIN B-12 PO) Take 1 tablet by mouth daily.    . famotidine (PEPCID) 20 MG tablet Take 1 tablet (20 mg total) by mouth 2 (two) times daily. 180 tablet 0  . glucose blood (ONETOUCH VERIO) test strip TEST BLOOD SUGAR DAILY 25 each 10  . levalbuterol (XOPENEX HFA) 45 MCG/ACT inhaler Inhale 1-2  puffs every 6 hours if needed- rescue inhaler 1 Inhaler 12  . LORazepam (ATIVAN) 0.5 MG tablet Take 1 tablet (0.5 mg total) by mouth every 6 (six) hours as needed for anxiety. (Patient taking differently: Take 0.25 mg by mouth 3 (three) times daily as needed for anxiety. ) 15 tablet 0  . Multiple Vitamins-Minerals (MULTIVITAMIN GUMMIES ADULT PO) Take 2 tablets by mouth daily.     . Norethindrone-Ethinyl Estradiol-Fe Biphas (LO LOESTRIN FE) 1 MG-10 MCG / 10 MCG tablet Take 1 tablet by mouth at bedtime.     [name] DELICA LANCETS 88P MISC USE TO TEST BLOOD SUGAR DAILY 100 each 3  . pantoprazole (PROTONIX) 40 MG tablet TAKE 1 TABLET BY MOUTH ONCE DAILY BEFORE BREAKFAST 90 tablet 0  . fluticasone (FLONASE) 50 MCG/ACT nasal spray Place 2 sprays into both nostrils daily. (Patient taking differently: Place 2 sprays into both nostrils daily as needed (seasonal allergies). ) 16 g 0   Current Facility-Administered Medications on File Prior to Visit  Medication Dose Route Frequency Provider Last Rate Last Admin  . cyanocobalamin ((VITAMIN B-12)) injection 1,000 mcg  1,000 mcg Intramuscular Q30 days [name], MD   1,000 mcg at 10/19/18 1837    ALLERGIES: Allergies  Allergen Reactions  . Penicillins Hives    Has patient had a PCN reaction causing immediate rash, facial/tongue/throat swelling, SOB or lightheadedness with hypotension:Yes Has patient had a PCN reaction causing severe rash involving mucus membranes or skin necrosis:unsure Has patient had a PCN reaction that required hospitalization: Yes Has patient had a PCN reaction occurring within the last 10 years: No If all of the above answers are "NO", then may proceed with Cephalosporin use.     . Tamiflu [Oseltamivir Phosphate] Nausea And Vomiting    Excessive vomiting  . Tinidazole Other (See Comments)    Caused severe abd pain/type of colitis   . Diltiazem Other (See Comments)    Dizzy, felt like she was going to pass out Blood  pressure went up per patient     FAMILY HISTORY: Family History  Problem Relation Age of Onset  . Seizures Mother   . Epilepsy Mother   . Cancer Father        Liver  . Diabetes Father 36  . Hypertension Father   . Heart disease Father 67       CEA, LE [place]enting  . Alzheimer's disease Maternal Grandmother   . Heart disease Maternal Grandfather   . Colon cancer Neg Hx   . Breast cancer Neg Hx     SOCIAL HISTORY: Social History   Socioeconomic History  . Marital status: Single    Spouse name: N/A  . Number of children: 0  . Years of education: 16  . Highest  education level: Not on file  Occupational History  . Occupation: Research scientist (physical sciences): [employer]  Tobacco Use  . Smoking status: Former Smoker    Packs/day: 0.25    Years: 10.00    Pack years: 2.50    Types: Cigarettes    Start date: 06/25/2000    Quit date: 08/19/2014    Years since quitting: 5.2  . Smokeless tobacco: Never Used  Substance and Sexual Activity  . Alcohol use: No    Alcohol/week: 0.0 standard drinks  . Drug use: No  . Sexual activity: Not Currently    Partners: Male    Birth control/protection: Pill  Other Topics Concern  . Not on file  Social History Narrative   Patient lives at home alone .   Patient works full time at [employer]   Education college.   Right handed.   Caffeine none   Social Determinants of Health   Financial Resource Strain:   . Difficulty of Paying Living Expenses: Not on file  Food Insecurity:   . Worried About Charity fundraiser in the Last Year: Not on file  . Ran Out of Food in the Last Year: Not on file  Transportation Needs:   . Lack of Transportation (Medical): Not on file  . Lack of Transportation (Non-Medical): Not on file  Physical Activity:   . Days of Exercise per Week: Not on file  . Minutes of Exercise per Session: Not on file  Stress:   . Feeling of Stress : Not on file  Social Connections:   . Frequency of Communication with  Friends and Family: Not on file  . Frequency of Social Gatherings with Friends and Family: Not on file  . Attends Religious Services: Not on file  . Active Member of Clubs or Organizations: Not on file  . Attends Archivist Meetings: Not on file  . Marital Status: Not on file  Intimate Partner Violence:   . Fear of Current or Ex-Partner: Not on file  . Emotionally Abused: Not on file  . Physically Abused: Not on file  . Sexually Abused: Not on file    REVIEW OF SYSTEMS: Constitutional: No fevers, chills, or sweats, no generalized fatigue, change in appetite Eyes: No visual changes, double vision, eye pain Ear, nose and throat: No hearing loss, ear pain, nasal congestion, sore throat Cardiovascular: No chest pain, palpitations Respiratory:  No shortness of breath at rest or with exertion, wheezes GastrointestinaI: No nausea, vomiting, diarrhea, abdominal pain, fecal incontinence Genitourinary:  No dysuria, urinary retention or frequency Musculoskeletal:  No neck pain, +back pain Integumentary: No rash, pruritus, skin lesions Neurological: as above Psychiatric: No depression, insomnia,+ anxiety Endocrine: No palpitations, fatigue, diaphoresis, mood swings, change in appetite, change in weight, increased thirst Hematologic/Lymphatic:  No anemia, purpura, petechiae. Allergic/Immunologic: no itchy/runny eyes, nasal congestion, recent allergic reactions, rashes  PHYSICAL EXAM: Vitals:   11/13/19 1418  Pulse: 80  SpO2: 100%   General: No acute distress Head:  Normocephalic/atraumatic Skin/Extremities: No rash, no edema Neurological Exam: Mental status: alert and oriented to person, place, and time, no dysarthria or aphasia, Fund of knowledge is appropriate.  Recent and remote memory are intact.  Attention and concentration are normal.    Able to name objects and repeat phrases. Cranial nerves: CN I: not tested CN II: pupils equal, round and reactive to light, visual  fields intact CN III, IV, VI:  full range of motion, no nystagmus, no ptosis CN V: facial  sensation intact CN VII: upper and lower face symmetric CN VIII: hearing intact to finger rub CN IX, X: gag intact, uvula midline CN XI: sternocleidomastoid and trapezius muscles intact CN XII: tongue midline Bulk & Tone: normal, no fasciculations. Motor: 5/5 throughout with no pronator drift. Sensation: intact to light touch, cold, pin on both UE. Decreased pin and vibration sense to ankles bilaterally. Romberg test negative Deep Tendon Reflexes: +2 throughout, no ankle clonus, negative Hoffman sign Plantar responses: downgoing bilaterally Cerebellar: no incoordination on finger to nose testing Gait: narrow-based and steady, able to tandem walk adequately. Tremor: none   IMPRESSION: This is a pleasant 44 year old right-handed woman with a history of anxiety presenting for evaluation of dizziness. She reports vertigo, as well as a sensation of imbalance. She also report paresthesias and some left hand weakness. Neurological exam shows evidence of a length-dependent neuropathy, we discussed how this can cause balance issues. Neuropathy labs and EMG/NCV of the left arm and leg will be ordered to further evaluate her symptoms. She will be referred for vestibular therapy for positional dizziness. Follow-up in 6 months,she knows to call for any changes.   Thank you for allowing me to participate in the care of this patient. Please do not hesitate to call for any questions or concerns.   [name], M.D.  CC: Dr. Nolon Rod

## 2019-11-15 ENCOUNTER — Ambulatory Visit: Payer: BC Managed Care – PPO | Attending: Internal Medicine

## 2019-11-15 DIAGNOSIS — Z20822 Contact with and (suspected) exposure to covid-19: Secondary | ICD-10-CM

## 2019-11-17 LAB — PROTEIN ELECTROPHORESIS, SERUM
Albumin ELP: 3.7 g/dL — ABNORMAL LOW (ref 3.8–4.8)
Alpha 1: 0.3 g/dL (ref 0.2–0.3)
Alpha 2: 0.6 g/dL (ref 0.5–0.9)
Beta 2: 0.3 g/dL (ref 0.2–0.5)
Beta Globulin: 0.5 g/dL (ref 0.4–0.6)
Gamma Globulin: 0.9 g/dL (ref 0.8–1.7)
Total Protein: 6.4 g/dL (ref 6.1–8.1)

## 2019-11-17 LAB — ANTI-NUCLEAR AB-TITER (ANA TITER): ANA Titer 1: 1:40 {titer} — ABNORMAL HIGH

## 2019-11-17 LAB — C-REACTIVE PROTEIN: CRP: 2.2 mg/L (ref <8.0)

## 2019-11-17 LAB — COPPER, SERUM: Copper: 179 ug/dL — ABNORMAL HIGH (ref 70–175)

## 2019-11-17 LAB — NOVEL CORONAVIRUS, NAA: SARS-CoV-2, NAA: NOT DETECTED

## 2019-11-17 LAB — TSH: TSH: 0.74 mIU/L

## 2019-11-17 LAB — VITAMIN B1: Vitamin B1 (Thiamine): 21 nmol/L (ref 8–30)

## 2019-11-17 LAB — B12 AND FOLATE PANEL
Folate: >24 ng/mL
Vitamin B-12: 678 pg/mL (ref 200–1100)

## 2019-11-17 LAB — ANA: Anti Nuclear Antibody (ANA): POSITIVE — AB

## 2019-11-17 LAB — SJOGRENS SYNDROME-B EXTRACTABLE NUCLEAR ANTIBODY: SSB (La) (ENA) Antibody, IgG: <1 AI

## 2019-11-17 LAB — IMMUNOFIXATION ELECTROPHORESIS
IgG (Immunoglobin G), Serum: 1181 mg/dL (ref 600–1640)
IgM, Serum: 100 mg/dL (ref 50–300)
Immunofix Electr Int: NOT DETECTED
Immunoglobulin A: 200 mg/dL (ref 47–310)

## 2019-11-17 LAB — SEDIMENTATION RATE: Sed Rate: 6 mm/h (ref 0–20)

## 2019-11-17 LAB — SJOGRENS SYNDROME-A EXTRACTABLE NUCLEAR ANTIBODY: SSA (Ro) (ENA) Antibody, IgG: <1 AI

## 2019-11-19 ENCOUNTER — Telehealth: Payer: Self-pay

## 2019-11-19 NOTE — Telephone Encounter (Signed)
-----   Message from Cameron Sprang, MD sent at 11/18/2019 10:47 AM EST ----- Pls let her know that the bloodwork was overall unremarkable, no significant abnormalities to cause her symptoms. Proceed with nerve and muscle test as planned, thanks

## 2019-11-19 NOTE — Telephone Encounter (Signed)
Left message on pt's vmail informing of results and to proceed with with nerve and muscle testing. Instructed to call if she has any concerns.

## 2019-11-21 ENCOUNTER — Encounter: Payer: Self-pay | Admitting: Neurology

## 2019-11-27 ENCOUNTER — Encounter: Payer: BC Managed Care – PPO | Admitting: Neurology

## 2019-11-27 ENCOUNTER — Ambulatory Visit: Payer: Self-pay | Admitting: Podiatry

## 2019-11-29 ENCOUNTER — Ambulatory Visit: Payer: BC Managed Care – PPO | Attending: Neurology | Admitting: Physical Therapy

## 2019-11-29 ENCOUNTER — Other Ambulatory Visit: Payer: Self-pay

## 2019-11-29 ENCOUNTER — Encounter: Payer: Self-pay | Admitting: Physical Therapy

## 2019-11-29 DIAGNOSIS — R2681 Unsteadiness on feet: Secondary | ICD-10-CM | POA: Diagnosis not present

## 2019-11-29 DIAGNOSIS — R42 Dizziness and giddiness: Secondary | ICD-10-CM | POA: Insufficient documentation

## 2019-11-29 DIAGNOSIS — M6281 Muscle weakness (generalized): Secondary | ICD-10-CM | POA: Insufficient documentation

## 2019-11-29 DIAGNOSIS — R209 Unspecified disturbances of skin sensation: Secondary | ICD-10-CM | POA: Insufficient documentation

## 2019-11-29 DIAGNOSIS — R208 Other disturbances of skin sensation: Secondary | ICD-10-CM

## 2019-11-29 DIAGNOSIS — R0789 Other chest pain: Secondary | ICD-10-CM | POA: Diagnosis not present

## 2019-11-29 DIAGNOSIS — Z20822 Contact with and (suspected) exposure to covid-19: Secondary | ICD-10-CM | POA: Insufficient documentation

## 2019-11-29 NOTE — Patient Instructions (Signed)
Access Code: [email or web address]  URL: [email or web address]  Date: 11/29/2019  Prepared by: Misty Stanley   Exercises Supine Transversus Abdominis Bracing - Hands on Stomach - 10 reps - 1 sets - 30 hold - 1x daily - 7x weekly Bent Knee Fallouts - 5 reps - 1 sets - 1x daily - 7x weekly Supine March - 5 reps - 1 sets - 1x daily - 7x weekly Seated Sciatic Tensioner - 10 reps - 2 sets - 1x daily - 7x weekly Seated Scapular Retraction - 10 reps - 3 sets - 1x daily - 7x weekly Seated 55 Gallon Barrel Hug Stretch - 5 reps - 1 sets - 10 hold - 1x daily - 7x weekly Prone Press Up on Elbows - 10 reps - 1 sets - 3x daily - 7x weekly Scapular Retraction with Resistance - 10 reps - 2 sets - 1x daily - 7x weekly Swiss Ball March - 10 reps - 2 sets - 1x daily - 7x weekly Seated March with Opposite Arm Flexion on Swiss Ball - 10 reps - 2 sets - 1x daily - 7x weekly stepping forward opposite arm and leg - 10 reps - 2 sets - 1x daily - 7x weekly Cat-Camel to Child's Pose - 10 reps - 2 sets - 1x daily - 7x weekly Seated Figure 4 Piriformis Stretch - 2 sets - 30 seconds hold - 1x daily - 7x weekly Supine Piriformis Stretch with Foot on Ground - 2 sets - 30 seconds hold - 1x daily - 7x weekly

## 2019-11-29 NOTE — Therapy (Signed)
[facility] [address] [place], [place], [address] Phone: [phone]   Fax:  [phone]  Physical Therapy Evaluation  Patient Details  Name: Brooke Turner MRN: [id number] Date of Birth: 10-Sep-1976 Referring Provider (PT): Cameron Sprang, MD   Encounter Date: 11/29/2019  PT End of Session - 11/29/19 1220    Visit Number  1    Number of Visits  4    Date for PT Re-Evaluation  12/20/19    Authorization Type  BCBS; VL: 30 PT, pt pays 40% of visit cost    PT Start Time  1025    PT Stop Time  1115    PT Time Calculation (min)  50 min    Activity Tolerance  Patient tolerated treatment well    Behavior During Therapy  [facility] for tasks assessed/performed       Past Medical History:  Diagnosis Date  . Anxiety   . Back pain   . Chest pain    a. 09/2014 Echo: EF 60-65%, no rwma, nl valves;  b. 12/2014 Neg ETT.  . Essential hypertension    a. 01/2016 Renal duplex: no RAS- ? renal cyst (renal u/s 4/17 - no cyst, nl study); b. 02/2016 24 hr BP Monitor: Mean SBP ~ 138 with Max of 198. Highest pressures noted in evening hours following placement of cuff.  . Fibroid uterus   . GERD (gastroesophageal reflux disease)   . Palpitations    a. 09/2014 nl event monitor.    Past Surgical History:  Procedure Laterality Date  . WISDOM TOOTH EXTRACTION      There were no vitals filed for this visit.   Subjective Assessment - 11/29/19 1032    Subjective  Onset of dizziness 2-3 years ago. Had a few episodes dx as vertigo. Over the past year she states a spinning, lightheaded, and 'fogginess' with bending over, visual scanning (primarily to R), and looking up to ceiling. Sometimes it feels like the floor is moving underneath her feet "like I'm on a boat." She has had a minor concussion in the numerous years ago that appears unrelated to symptoms based on timeline. She reports tinitus, HA, sinus/head pressure and denied  challenges with swallowing. She has N&T in hands and feet bilaterally not concurrent with dizziness episodes.  Scheduled to have NCV test for bilateral hands & feet the first week in February. Has had a dec in grip strength and has noticed dropping items more in the L hand. Had bloodwork done recently - overall unremarkable however pt noticed ANA levels were elevated and she appeared concerned but did not know what that meant and had not received clarification from dr.    Robby Sermon  Standing;Walking    Patient Stated Goals  get rid of dizziness with every day tasks (i.e. bending over), able to go running without sx, feel more steady    Currently in Pain?  No/denies         [facility] PT Assessment - 11/29/19 1036      Assessment   Medical Diagnosis  Vertigo, balance    Referring Provider (PT)  Cameron Sprang, MD    Onset Date/Surgical Date  11/13/19   referral date    Hand Dominance  Right      Precautions   Precautions  None      Restrictions   Weight Bearing Restrictions  No      Balance Screen   Has the patient fallen in the past 6  months  No    Has the patient had a decrease in activity level because of a fear of falling?   No    Is the patient reluctant to leave their home because of a fear of falling?   No      Prior Function   Level of Independence  Independent    Vocation  Full time employment    Vocation Requirements  works at a computer      Observation/Other Assessments   Focus on Therapeutic Outcomes (FOTO)   N/A      Sensation   Light Touch  Impaired Detail    Light Touch Impaired Details  Impaired LUE;Impaired RLE      Coordination   Gross Motor Movements are Fluid and Coordinated  Yes    Finger Nose Finger Test  [facility]    Heel Shin Test  WFL      ROM / Strength   AROM / PROM / Strength  Strength      Strength   Overall Strength  Deficits    Overall Strength Comments  UE and LE; Globally WNL, L hip flexion & DF 4/5           Vestibular Assessment -  11/29/19 1039      Vestibular Assessment   General Observation  seated posture leaning to the L       Symptom Behavior   Subjective history of current problem  see subjective hx     Type of Dizziness   Lightheadedness;Spinning;Imbalance;Vertigo    Frequency of Dizziness  daily    Symptom Nature  Positional;Motion provoked    Aggravating Factors  Looking up to the ceiling;Turning head quickly;Turning head sideways;Forward bending    Relieving Factors  No known relieving factors    Progression of Symptoms  Worse      Oculomotor Exam   Oculomotor Alignment  Normal    Ocular ROM  WFL    Spontaneous  Absent    Gaze-induced   Absent    Smooth Pursuits  Intact    Saccades  Intact   seeing floaters with vertical saccades   Comment  Test of skew: negative      Oculomotor Exam-Fixation Suppressed    Left Head Impulse  Negative    Right Head Impulse  Negative      Vestibulo-Ocular Reflex   VOR to Slow Head Movement  Normal    VOR Cancellation  Normal      Positional Testing   Dix-Hallpike  Dix-Hallpike Right;Dix-Hallpike Left    Horizontal Canal Testing  Horizontal Canal Right;Horizontal Canal Left      Dix-Hallpike Right   Dix-Hallpike Right Duration  moderate nausea, visual "shadows/spots", dizziness     Dix-Hallpike Right Symptoms  No nystagmus      Dix-Hallpike Left   Dix-Hallpike Left Duration  mild nausea    Dix-Hallpike Left Symptoms  No nystagmus      Horizontal Canal Right   Horizontal Canal Right Duration  0    Horizontal Canal Right Symptoms  Other (comment)   inc nausea, HR: 107 bpm     Horizontal Canal Left   Horizontal Canal Left Duration  0    Horizontal Canal Left Symptoms  Other (comment)   mild nausea, HR: 95, started seeing "spots"          Objective measurements completed on examination: See above findings.      [facility] Adult PT Treatment/Exercise - 11/29/19 1236      Therapeutic Activites  Therapeutic Activities  Other Therapeutic Activities     Other Therapeutic Activities  Therapist discussed exam findings with pt. Pt continued to question therapist seeking answers for what she is experiencing. Expressed that at this time, PT needs to do further assessment and speak with physician about findings and history report - pt approved. Educated pt on typical presentation of BPPV and discussed how floaters or seeing "spots" can be a response to blood pressure fluctuations with positional changes.              PT Education - 11/29/19 1213    Education Details  PT role, examination findings, POC; see TA    Person(s) Educated  Patient    Methods  Explanation    Comprehension  Verbalized understanding       PT Short Term Goals - 11/29/19 1238      PT SHORT TERM GOAL #1   Title  = LTG        PT Long Term Goals - 11/29/19 1238      PT LONG TERM GOAL #1   Title  Patient will participate in further balance assessment. (all LTG target date: 12/20/2019)    Time  3    Period  Weeks    Status  New    Target Date  12/20/19      PT LONG TERM GOAL #2   Title  Patient will tolerate orthostatics assessment.    Time  3    Period  Weeks    Status  New    Target Date  12/20/19      PT LONG TERM GOAL #3   Title  Patient will tolerate rolling to the R with </= mild symptoms.    Baseline  11/29/19: moderate nausea, floaters, and SNS repsonse inc HR    Time  3    Period  Weeks    Status  New    Target Date  12/20/19             Plan - 11/29/19 1223    Clinical Impression Statement  Patient is a 44 yo female referred to Neuro OPPT for vertigo & balance. Her PMH is significant for: anxiety & HTN. PT examination revealed deficits in strength, sensation, and nausea + visual changes during positional changes. Oculomotor exam was unremarkable, except for pt reported seeing floaters with vertical saccades. Left and right head impulse test was negative. Positional testing for BPPV was negative for nystagmus. However, pt has  sympathetic response with R roll test with inc in RR, inc HR to 107 bpm, moderate nausea, and she saw floaters.  When performing roll test to the L, pt's HR dec rapidly to mid 90s. She experienced nausea and floaters with R dix-hallpike as well. She reported having increased ANA levels in recent blood work up but has not received any further consultation regarding levels. Therapist plans to communicate exam findings with physician with pt approval. Will perform further assessment during future sessions and tx if appropriate.    Personal Factors and Comorbidities  Comorbidity 2;Past/Current Experience    Comorbidities  anxiety, HTN    Examination-Activity Limitations  Bend;Locomotion Level;Bed Mobility;Other;Stand   regular exercise routine: running   Examination-Participation Restrictions  Community Activity;Driving    Stability/Clinical Decision Making  Evolving/Moderate complexity    Clinical Decision Making  Moderate    Rehab Potential  Fair    PT Frequency  1x / week    PT Duration  3 weeks    PT Treatment/Interventions  Neuromuscular re-education;Gait training;Stair training;Functional mobility training;Therapeutic activities;Therapeutic exercise;Balance training;Patient/family education;Vestibular;ADLs/Self Care Home Management    PT Next Visit Plan  monitor vitals -- orthostatics vitals, further balance assessment    Consulted and Agree with Plan of Care  Patient       Patient will benefit from skilled therapeutic intervention in order to improve the following deficits and impairments:  Dizziness, Decreased activity tolerance, Decreased balance, Impaired sensation, Decreased strength  Visit Diagnosis: Dizziness and giddiness  Unsteadiness on feet  Other disturbances of skin sensation  Muscle weakness (generalized)     Problem List Patient Active Problem List   Diagnosis Date Noted  . Costochondral junction syndrome 10/02/2019  . OSA (obstructive sleep apnea) 06/11/2019  .  Encounter for examination following treatment at hospital 04/25/2019  . Hypokalemia 04/25/2019  . Elevated glucose 04/25/2019  . History of posttraumatic stress disorder (PTSD) 12/31/2018  . History of panic attacks 12/31/2018  . Palpitations   . Chest pain of uncertain etiology   . Chronic tension-type headache, not intractable 06/10/2015  . Dyspnea 03/18/2015  . Post-traumatic stress reaction 02/25/2015  . Anxiety disorder due to general medical condition with panic attack 02/25/2015  . Subserous leiomyoma of uterus 02/25/2015  . Anxiety about health 02/25/2015  . Deviated nasal septum 12/30/2014  . Chronic rhinitis 12/30/2014  . Chronic maxillary sinusitis 12/30/2014  . Snorings 09/30/2014  . Prediabetes 08/26/2014  . Acanthosis nigricans 03/19/2014  . Vitamin D deficiency 07/15/2013  . Large breasts 09/12/2012  . Essential hypertension 02/17/2012  . Obesity (BMI 30-39.9) previous BMI was over 40. Patient has lost 70 pounds in the last year 02/17/2012  . Anxiety disorder 02/17/2012  . Chronic back pain 02/17/2012    Juliann Pulse SPT 11/29/2019, 12:41 PM  [facility] [address] [place], [place], [address] Phone: [phone]   Fax:  [phone]  Name: Tyresa Voorhees MRN: [id number] Date of Birth: 1976-11-06

## 2019-12-01 ENCOUNTER — Emergency Department (HOSPITAL_COMMUNITY): Payer: BC Managed Care – PPO

## 2019-12-01 ENCOUNTER — Encounter (HOSPITAL_COMMUNITY): Payer: Self-pay

## 2019-12-01 ENCOUNTER — Other Ambulatory Visit: Payer: Self-pay

## 2019-12-01 ENCOUNTER — Emergency Department (HOSPITAL_COMMUNITY)
Admission: EM | Admit: 2019-12-01 | Discharge: 2019-12-02 | Disposition: A | Discharge status: Home / Self Care | Payer: BC Managed Care – PPO | Source: Home / Self Care | Attending: Emergency Medicine | Admitting: Emergency Medicine

## 2019-12-01 DIAGNOSIS — R002 Palpitations: Secondary | ICD-10-CM | POA: Insufficient documentation

## 2019-12-01 DIAGNOSIS — Z87891 Personal history of nicotine dependence: Secondary | ICD-10-CM | POA: Insufficient documentation

## 2019-12-01 DIAGNOSIS — I10 Essential (primary) hypertension: Secondary | ICD-10-CM | POA: Insufficient documentation

## 2019-12-01 DIAGNOSIS — Z79899 Other long term (current) drug therapy: Secondary | ICD-10-CM | POA: Insufficient documentation

## 2019-12-01 DIAGNOSIS — R079 Chest pain, unspecified: Secondary | ICD-10-CM | POA: Diagnosis not present

## 2019-12-01 DIAGNOSIS — Z20822 Contact with and (suspected) exposure to covid-19: Secondary | ICD-10-CM | POA: Insufficient documentation

## 2019-12-01 DIAGNOSIS — R0789 Other chest pain: Secondary | ICD-10-CM | POA: Insufficient documentation

## 2019-12-01 LAB — CBC
HCT: 38 % (ref 36.0–46.0)
Hemoglobin: 12.9 g/dL (ref 12.0–15.0)
MCH: 30.4 pg (ref 26.0–34.0)
MCHC: 33.9 g/dL (ref 30.0–36.0)
MCV: 89.6 fL (ref 80.0–100.0)
Platelets: 251 10*3/uL (ref 150–400)
RBC: 4.24 MIL/uL (ref 3.87–5.11)
RDW: 12.5 % (ref 11.5–15.5)
WBC: 6.9 10*3/uL (ref 4.0–10.5)
nRBC: 0 % (ref 0.0–0.2)

## 2019-12-01 LAB — I-STAT BETA HCG BLOOD, ED (MC, WL, AP ONLY): I-stat hCG, quantitative: <5 m[IU]/mL (ref <5)

## 2019-12-01 MED ORDER — SODIUM CHLORIDE 0.9% FLUSH
3.0000 mL | Freq: Once | INTRAVENOUS | Status: AC
  Administered 2019-12-01: 3 mL via INTRAVENOUS

## 2019-12-01 NOTE — ED Triage Notes (Signed)
Pt arrives POV for eval of episode of palpitations w/ jaw tightness earlier this evening. Pt reports no hx of diabetes, but states she checked a sugar and noted it to be 69. She ate and felt better, but persisted w/ some chest discomfort and called PCP who advised her to present here.

## 2019-12-02 ENCOUNTER — Other Ambulatory Visit: Payer: Self-pay

## 2019-12-02 ENCOUNTER — Ambulatory Visit: Payer: Self-pay | Admitting: Podiatry

## 2019-12-02 ENCOUNTER — Ambulatory Visit: Payer: BC Managed Care – PPO | Admitting: Physical Therapy

## 2019-12-02 ENCOUNTER — Encounter: Payer: Self-pay | Admitting: Physical Therapy

## 2019-12-02 ENCOUNTER — Telehealth: Payer: Self-pay

## 2019-12-02 DIAGNOSIS — R42 Dizziness and giddiness: Secondary | ICD-10-CM

## 2019-12-02 DIAGNOSIS — M6281 Muscle weakness (generalized): Secondary | ICD-10-CM

## 2019-12-02 DIAGNOSIS — R208 Other disturbances of skin sensation: Secondary | ICD-10-CM

## 2019-12-02 DIAGNOSIS — R2681 Unsteadiness on feet: Secondary | ICD-10-CM

## 2019-12-02 LAB — BASIC METABOLIC PANEL
Anion gap: 10 (ref 5–15)
BUN: 7 mg/dL (ref 6–20)
CO2: 23 mmol/L (ref 22–32)
Calcium: 8.8 mg/dL — ABNORMAL LOW (ref 8.9–10.3)
Chloride: 106 mmol/L (ref 98–111)
Creatinine, Ser: 1.02 mg/dL — ABNORMAL HIGH (ref 0.44–1.00)
GFR calc Af Amer: >60 mL/min (ref >60)
GFR calc non Af Amer: >60 mL/min (ref >60)
Glucose, Bld: 96 mg/dL (ref 70–99)
Potassium: 3.4 mmol/L — ABNORMAL LOW (ref 3.5–5.1)
Sodium: 139 mmol/L (ref 135–145)

## 2019-12-02 LAB — TROPONIN I (HIGH SENSITIVITY)
Troponin I (High Sensitivity): 3 ng/L (ref <18)
Troponin I (High Sensitivity): <2 ng/L (ref <18)

## 2019-12-02 LAB — SARS CORONAVIRUS 2 (TAT 6-24 HRS): SARS Coronavirus 2: NEGATIVE

## 2019-12-02 NOTE — Therapy (Signed)
[facility] [address] [place], [place], [address] Phone: [phone]   Fax:  [phone]  Physical Therapy Treatment  Patient Details  Name: Brooke Turner MRN: [id number] Date of Birth: 09/12/1976 Referring Provider (PT): Aquino, Karen M, MD   Encounter Date: 12/02/2019  PT End of Session - 12/02/19 1417    Visit Number  2    Number of Visits  4    Date for PT Re-Evaluation  12/20/19    Authorization Type  BCBS; VL: 30 PT, pt pays 40% of visit cost    Authorization - Visit Number  2    Authorization - Number of Visits  30    PT Start Time  1322    PT Stop Time  1408    PT Time Calculation (min)  46 min    Activity Tolerance  Other (comment)   limited by BP symptoms   Behavior During Therapy  Anxious       Past Medical History:  Diagnosis Date  . Anxiety   . Back pain   . Chest pain    a. 09/2014 Echo: EF 60-65%, no rwma, nl valves;  b. 12/2014 Neg ETT.  . Essential hypertension    a. 01/2016 Renal duplex: no RAS- ? renal cyst (renal u/s 4/17 - no cyst, nl study); b. 02/2016 24 hr BP Monitor: Mean SBP ~ 138 with Max of 198. Highest pressures noted in evening hours following placement of cuff.  . Fibroid uterus   . GERD (gastroesophageal reflux disease)   . Palpitations    a. 09/2014 nl event monitor.    Past Surgical History:  Procedure Laterality Date  . WISDOM TOOTH EXTRACTION      There were no vitals filed for this visit.  Subjective Assessment - 12/02/19 1325    Subjective  Yesterday pt began to have heart palpitations and L chest pain - checked her CBG which was a little low.  Called PCP who recommended she go to ER.  Went to ER - had bloodwork done which showed low calcium and potassium but all other testing was normal.  Feels fatigued today.  Was lightheaded and a little nausea during that episode.    Limitations  Standing;Walking    Patient Stated Goals  get rid of dizziness with  every day tasks (i.e. bending over), able to go running without sx, feel more steady    Currently in Pain?  Yes    Pain Location  Chest    Pain Orientation  Left    Pain Descriptors / Indicators  Other (Comment)   intermittent, quick sharp - a few seconds   Pain Type  Acute pain         [facility] PT Assessment - 12/02/19 1352      Functional Gait  Assessment   Gait assessed   Yes    Gait Level Surface  Walks 20 ft in less than 5.5 sec, no assistive devices, good speed, no evidence for imbalance, normal gait pattern, deviates no more than 6 in outside of the 12 in walkway width.    Change in Gait Speed  Able to smoothly change walking speed without loss of balance or gait deviation. Deviate no more than 6 in outside of the 12 in walkway width.    Gait with Horizontal Head Turns  Performs head turns smoothly with no change in gait. Deviates no more than 6 in outside 12 in walkway width    Gait with   Vertical Head Turns  Performs head turns with no change in gait. Deviates no more than 6 in outside 12 in walkway width.    Gait and Pivot Turn  Pivot turns safely within 3 sec and stops quickly with no loss of balance.    Step Over Obstacle  Is able to step over 2 stacked shoe boxes taped together (9 in total height) without changing gait speed. No evidence of imbalance.    Gait with Narrow Base of Support  Is able to ambulate for 10 steps heel to toe with no staggering.    Gait with Eyes Closed  Walks 20 ft, no assistive devices, good speed, no evidence of imbalance, normal gait pattern, deviates no more than 6 in outside 12 in walkway width. Ambulates 20 ft in less than 7 sec.    Ambulating Backwards  Walks 20 ft, no assistive devices, good speed, no evidence for imbalance, normal gait    Steps  Alternating feet, no rail.    Total Score  30    FGA comment:  30/30; lightheaded with vertical head movements.  BP afterwards increased to 180/100         Vestibular Assessment - 12/02/19 1334       Orthostatics   BP supine (x 5 minutes)  164/100    HR supine (x 5 minutes)  69    BP sitting  160/95    BP standing (after 1 minute)  165/100    Orthostatics Comment  had to lie with 3 pillows under head - had some lines/floaters in vision when lying supine - no dizziness and no nausea                       PT Education - 12/02/19 1414    Education Details  Will cancel next session to allow pt to f/u with physicians and then will recheck symptoms on 2/12    Person(s) Educated  Patient    Methods  Explanation    Comprehension  Verbalized understanding       PT Short Term Goals - 11/29/19 1238      PT SHORT TERM GOAL #1   Title  = LTG        PT Long Term Goals - 12/02/19 1425      PT LONG TERM GOAL #1   Title  Patient will participate in further balance assessment. (all LTG target date: 12/20/2019)    Baseline  MET - FGA 30/30 - no LTG needed    Time  3    Period  Weeks    Status  Achieved      PT LONG TERM GOAL #2   Title  Patient will tolerate orthostatics assessment.    Baseline  elevated BP 160/100    Time  3    Period  Weeks    Status  Achieved      PT LONG TERM GOAL #3   Title  Patient will tolerate rolling to the R with </= mild symptoms.    Baseline  11/29/19: moderate nausea, floaters, and SNS repsonse inc HR    Time  3    Period  Weeks    Status  New      PT LONG TERM GOAL #4   Title  Pt will report being able to ambulate with vertical and horizontal head turns with no reports of lightheadedness/dizziness to improve safety with community gait.    Time  3    Period    Weeks    Status  New    Target Date  12/20/19            Plan - 12/02/19 1419    Clinical Impression Statement  Discussed with pt recommendations made by neurology; performed further assessment of patient's symptoms with orthostatic vitals assessment and FGA.  Pt demonstrated elevated BP throughout orthostatic testing (160-180/100) with symptoms of lightheadedness.  Also  performed FGA with pt scoring 30/30 but reporting lightheadedness with vertical head movements.  No episodes of true vertigo/spinning.  Pt's symptoms are brought on with head movements but pt reports more of a sense of lightheadedness and visual changes.  Recommending pt hold on therapy for now and follow up with PCP or cardiology prior to continuing with vestibular rehab to see if there is a cardiac component to her symptoms.  Pt to return on 2/12 for re-assessment.    Personal Factors and Comorbidities  Comorbidity 2;Past/Current Experience    Comorbidities  anxiety, HTN    Examination-Activity Limitations  Bend;Locomotion Level;Bed Mobility;Other;Stand   regular exercise routine: running   Examination-Participation Restrictions  Community Activity;Driving    Stability/Clinical Decision Making  Evolving/Moderate complexity    Rehab Potential  Fair    PT Frequency  1x / week    PT Duration  3 weeks    PT Treatment/Interventions  Neuromuscular re-education;Gait training;Stair training;Functional mobility training;Therapeutic activities;Therapeutic exercise;Balance training;Patient/family education;Vestibular;ADLs/Self Care Home Management    PT Next Visit Plan  Did pt follow up with PCP/cardiology/neurology?  How are symptoms of lightheadedness, visual symptoms, nausea?  Perform re-assessment and determine if pt would benefit from vestibular rehab    Consulted and Agree with Plan of Care  Patient       Patient will benefit from skilled therapeutic intervention in order to improve the following deficits and impairments:  Dizziness, Decreased activity tolerance, Decreased balance, Impaired sensation, Decreased strength  Visit Diagnosis: Dizziness and giddiness  Unsteadiness on feet  Other disturbances of skin sensation  Muscle weakness (generalized)     Problem List Patient Active Problem List   Diagnosis Date Noted  . Costochondral junction syndrome 10/02/2019  . OSA (obstructive  sleep apnea) 06/11/2019  . Encounter for examination following treatment at hospital 04/25/2019  . Hypokalemia 04/25/2019  . Elevated glucose 04/25/2019  . History of posttraumatic stress disorder (PTSD) 12/31/2018  . History of panic attacks 12/31/2018  . Palpitations   . Chest pain of uncertain etiology   . Chronic tension-type headache, not intractable 06/10/2015  . Dyspnea 03/18/2015  . Post-traumatic stress reaction 02/25/2015  . Anxiety disorder due to general medical condition with panic attack 02/25/2015  . Subserous leiomyoma of uterus 02/25/2015  . Anxiety about health 02/25/2015  . Deviated nasal septum 12/30/2014  . Chronic rhinitis 12/30/2014  . Chronic maxillary sinusitis 12/30/2014  . Snorings 09/30/2014  . Prediabetes 08/26/2014  . Acanthosis nigricans 03/19/2014  . Vitamin D deficiency 07/15/2013  . Large breasts 09/12/2012  . Essential hypertension 02/17/2012  . Obesity (BMI 30-39.9) previous BMI was over 40. Patient has lost 70 pounds in the last year 02/17/2012  . Anxiety disorder 02/17/2012  . Chronic back pain 02/17/2012    Rico Junker, PT, DPT 12/02/19    2:28 PM    [facility] [address] [place], [place], [address] Phone: [phone]   Fax:  [phone]  Name: Creola Krotz MRN: [id number] Date of Birth: 06-Nov-1976

## 2019-12-02 NOTE — ED Provider Notes (Signed)
Emergency Department Provider Note   I have reviewed the triage vital signs and the nursing notes.   HISTORY  Chief Complaint Chest Pain and Palpitations   HPI Brooke Turner is a 44 y.o. female with medical problems documented below who presents the emergency department today with multiple complaints.  Patient states she was cooking supper and she started feeling palpitations and then she felt shaky so she checked her blood sugar and it was 69 and she thinks that that was too low so she ate some sugar and then shortly after started having some chest discomfort that she describes as sharp and some jaw clenching and left upper arm pain.  Little bit of nausea.  No shortness of breath or vomiting.  No diaphoresis.  Patient states she checked her blood sugar and it was over 100 but she is still symptomatic so she called her doctor's office and on-call nurse and her here for further evaluation.  Review of systems patient states that she had a dry cough off and on for the last few days no fevers.  She states she has mild suprapubic abdominal pain but no dysuria.  She does states she has increased urinary frequency and some urge incontinence.  No diarrhea or constipation.  No rashes.  No recent trauma. Basically asymptomatic at this time.    No other associated or modifying symptoms.    Past Medical History:  Diagnosis Date  . Anxiety   . Back pain   . Chest pain    a. 09/2014 Echo: EF 60-65%, no rwma, nl valves;  b. 12/2014 Neg ETT.  . Essential hypertension    a. 01/2016 Renal duplex: no RAS- ? renal cyst (renal u/s 4/17 - no cyst, nl study); b. 02/2016 24 hr BP Monitor: Mean SBP ~ 138 with Max of 198. Highest pressures noted in evening hours following placement of cuff.  . Fibroid uterus   . GERD (gastroesophageal reflux disease)   . Palpitations    a. 09/2014 nl event monitor.    Patient Active Problem List   Diagnosis Date Noted  . Costochondral junction syndrome  10/02/2019  . OSA (obstructive sleep apnea) 06/11/2019  . Encounter for examination following treatment at hospital 04/25/2019  . Hypokalemia 04/25/2019  . Elevated glucose 04/25/2019  . History of posttraumatic stress disorder (PTSD) 12/31/2018  . History of panic attacks 12/31/2018  . Palpitations   . Chest pain of uncertain etiology   . Chronic tension-type headache, not intractable 06/10/2015  . Dyspnea 03/18/2015  . Post-traumatic stress reaction 02/25/2015  . Anxiety disorder due to general medical condition with panic attack 02/25/2015  . Subserous leiomyoma of uterus 02/25/2015  . Anxiety about health 02/25/2015  . Deviated nasal septum 12/30/2014  . Chronic rhinitis 12/30/2014  . Chronic maxillary sinusitis 12/30/2014  . Snorings 09/30/2014  . Prediabetes 08/26/2014  . Acanthosis nigricans 03/19/2014  . Vitamin D deficiency 07/15/2013  . Large breasts 09/12/2012  . Essential hypertension 02/17/2012  . Obesity (BMI 30-39.9) previous BMI was over 40. Patient has lost 70 pounds in the last year 02/17/2012  . Anxiety disorder 02/17/2012  . Chronic back pain 02/17/2012    Past Surgical History:  Procedure Laterality Date  . WISDOM TOOTH EXTRACTION      Current Outpatient Rx  . Order #: [id number] Class: Historical Med  . Order #: [id number] Class: Historical Med  . Order #: [id number] Class: Historical Med  . Order #: [id number] Class: Fax  . Order #: [id number] Class: Historical Med  .  Order #: [id number] Class: Historical Med  . Order #: [id number] Class: Normal  . Order #: [id number] Class: Normal  . Order #: [id number] Class: Normal  . Order #: [id number] Class: Normal  . Order #: [id number] Class: Normal  . Order #: [id number] Class: Historical Med  . Order #: [id number] Class: Historical Med  . Order #: [id number] Class: Normal  . Order #: [id number] Class: Normal    Allergies Penicillins, Tamiflu [oseltamivir phosphate], Tinidazole, and Diltiazem  Family History  Problem Relation  Age of Onset  . Seizures Mother   . Epilepsy Mother   . Cancer Father        Liver  . Diabetes Father 95  . Hypertension Father   . Heart disease Father 15       CEA, LE Stenting  . Alzheimer's disease Maternal Grandmother   . Heart disease Maternal Grandfather   . Colon cancer Neg Hx   . Breast cancer Neg Hx     Social History Social History   Tobacco Use  . Smoking status: Former Smoker    Packs/day: 0.25    Years: 10.00    Pack years: 2.50    Types: Cigarettes    Start date: 06/25/2000    Quit date: 08/19/2014    Years since quitting: 5.2  . Smokeless tobacco: Never Used  Substance Use Topics  . Alcohol use: No    Alcohol/week: 0.0 standard drinks  . Drug use: No    Review of Systems  All other systems negative except as documented in the HPI. All pertinent positives and negatives as reviewed in the HPI. ____________________________________________   PHYSICAL EXAM:  VITAL SIGNS: ED Triage Vitals  Enc Vitals Group     BP 12/01/19 2227 (!) 153/89     Pulse Rate 12/01/19 2227 65     Resp 12/01/19 2227 18     Temp 12/01/19 2227 99.6 F (37.6 C)     Temp Source 12/01/19 2227 Oral     SpO2 12/01/19 2227 100 %     Weight 12/01/19 2228 170 lb (77.1 kg)     Height 12/01/19 2228 5\' 4"  (1.626 m)    Constitutional: Alert and oriented. Well appearing and in no acute distress. Eyes: Conjunctivae are normal. PERRL. EOMI. Head: Atraumatic. Nose: No congestion/rhinnorhea. Mouth/Throat: Mucous membranes are moist.  Oropharynx non-erythematous. Neck: No stridor.  No meningeal signs.   Cardiovascular: Normal rate, regular rhythm. Good peripheral circulation. Grossly normal heart sounds.   Respiratory: Normal respiratory effort.  No retractions. Lungs CTAB. Gastrointestinal: Soft and nontender. No distention.  Musculoskeletal: No lower extremity tenderness nor edema. No gross deformities of extremities. Neurologic:  Normal speech and language. No gross focal neurologic  deficits are appreciated.  Skin:  Skin is warm, dry and intact. No rash noted.   ____________________________________________   LABS (all labs ordered are listed, but only abnormal results are displayed)  Labs Reviewed  SARS CORONAVIRUS 2 (TAT 6-24 HRS)  CBC  BASIC METABOLIC PANEL  I-STAT BETA HCG BLOOD, ED ([facility], [facility], [facility] ONLY)  TROPONIN I (HIGH SENSITIVITY)  TROPONIN I (HIGH SENSITIVITY)   ____________________________________________  EKG   EKG Interpretation  Date/Time:  Sunday December 01 2019 22:18:43 EST Ventricular Rate:  78 PR Interval:  166 QRS Duration: 84 QT Interval:  368 QTC Calculation: 419 R Axis:   51 Text Interpretation: Normal sinus rhythm Normal ECG No significant change since last tracing Confirmed by Merrily Pew [phone]) on 12/01/2019 10:54:36 PM       ____________________________________________  RADIOLOGY  DG Chest 2 View  Result Date: 12/01/2019 CLINICAL DATA:  Chest pain and palpitations. EXAM: CHEST - 2 VIEW COMPARISON:  August 21, 2019 FINDINGS: The heart size and mediastinal contours are within normal limits. Both lungs are clear. The visualized skeletal structures are unremarkable. IMPRESSION: No active cardiopulmonary disease. Electronically Signed   By: Virgina Norfolk M.D.   On: 12/01/2019 22:44    ____________________________________________   PROCEDURES  Procedure(s) performed:   Procedures   ____________________________________________   INITIAL IMPRESSION / ASSESSMENT AND PLAN / ED COURSE  I do not think her blood sugar 69 cause her symptoms.  I think is more likely she was in some elevated blood pressure because of her blood sugar worries because of multiple other problems.  Doubt ACS, PE, pneumonia or pneumothorax.  EKG is unremarkable same as many many previous EKGs.  Anxiety is probably playing a role in this.  Will rule out for ACS and discharged follow-up with cardiology and primary doctor as needed.  Workup  unremarkable. On cardiac monitoring for >4 hours without arrhythmia on monitor.   Delta troponins unremarkable. ECG's ok.   Pertinent labs & imaging results that were available during my care of the patient were reviewed by me and considered in my medical decision making (see chart for details).  A medical screening exam was performed and I feel the patient has had an appropriate workup for their chief complaint at this time and likelihood of emergent condition existing is low. They have been counseled on decision, discharge, follow up and which symptoms necessitate immediate return to the emergency department. They or their family verbally stated understanding and agreement with plan and discharged in stable condition.   ____________________________________________  FINAL CLINICAL IMPRESSION(S) / ED DIAGNOSES  Final diagnoses:  None     MEDICATIONS GIVEN DURING THIS VISIT:  Medications  sodium chloride flush (NS) 0.9 % injection 3 mL (3 mLs Intravenous Given 12/01/19 2324)     NEW OUTPATIENT MEDICATIONS STARTED DURING THIS VISIT:  New Prescriptions   No medications on file    Note:  This note was prepared with assistance of Dragon voice recognition software. Occasional wrong-word or sound-a-like substitutions may have occurred due to the inherent limitations of voice recognition software.   Floreine Kingdon, Corene Cornea, MD 12/02/19 [phone]

## 2019-12-02 NOTE — Telephone Encounter (Signed)
Pt called voice mail left for her to call back

## 2019-12-02 NOTE — Progress Notes (Signed)
[id number]  Valid until 07/23

## 2019-12-10 ENCOUNTER — Ambulatory Visit (INDEPENDENT_AMBULATORY_CARE_PROVIDER_SITE_OTHER): Payer: BC Managed Care – PPO | Admitting: Sports Medicine

## 2019-12-10 ENCOUNTER — Ambulatory Visit (INDEPENDENT_AMBULATORY_CARE_PROVIDER_SITE_OTHER): Payer: BC Managed Care – PPO | Admitting: Neurology

## 2019-12-10 ENCOUNTER — Encounter: Payer: Self-pay | Admitting: Sports Medicine

## 2019-12-10 ENCOUNTER — Other Ambulatory Visit: Payer: Self-pay

## 2019-12-10 VITALS — Temp 97.3°F

## 2019-12-10 DIAGNOSIS — R42 Dizziness and giddiness: Secondary | ICD-10-CM | POA: Diagnosis not present

## 2019-12-10 DIAGNOSIS — M2012 Hallux valgus (acquired), left foot: Secondary | ICD-10-CM | POA: Diagnosis not present

## 2019-12-10 DIAGNOSIS — M2041 Other hammer toe(s) (acquired), right foot: Secondary | ICD-10-CM

## 2019-12-10 DIAGNOSIS — M2042 Other hammer toe(s) (acquired), left foot: Secondary | ICD-10-CM

## 2019-12-10 DIAGNOSIS — M2011 Hallux valgus (acquired), right foot: Secondary | ICD-10-CM | POA: Diagnosis not present

## 2019-12-10 DIAGNOSIS — M79671 Pain in right foot: Secondary | ICD-10-CM

## 2019-12-10 DIAGNOSIS — R2689 Other abnormalities of gait and mobility: Secondary | ICD-10-CM

## 2019-12-10 DIAGNOSIS — M79672 Pain in left foot: Secondary | ICD-10-CM

## 2019-12-10 DIAGNOSIS — G5602 Carpal tunnel syndrome, left upper limb: Secondary | ICD-10-CM

## 2019-12-10 DIAGNOSIS — L84 Corns and callosities: Secondary | ICD-10-CM | POA: Diagnosis not present

## 2019-12-10 NOTE — Patient Instructions (Signed)
Bunion  A bunion is a bump on the base of the big toe that forms when the bones of the big toe joint move out of position. Bunions may be small at first, but they often get larger over time. They can make walking painful. What are the causes? A bunion may be caused by:  Wearing narrow or pointed shoes that force the big toe to press against the other toes.  Abnormal foot development that causes the foot to roll inward (pronate).  Changes in the foot that are caused by certain diseases, such as rheumatoid arthritis or polio.  A foot injury. What increases the risk? The following factors may make you more likely to develop this condition:  Wearing shoes that squeeze the toes together.  Having certain diseases, such as: ? Rheumatoid arthritis. ? Polio. ? Cerebral palsy.  Having family members who have bunions.  Being born with a foot deformity, such as flat feet or low arches.  Doing activities that put a lot of pressure on the feet, such as ballet dancing. What are the signs or symptoms? The main symptom of a bunion is a noticeable bump on the big toe. Other symptoms may include:  Pain.  Swelling around the big toe.  Redness and inflammation.  Thick or hardened skin on the big toe or between the toes.  Stiffness or loss of motion in the big toe.  Trouble with walking. How is this diagnosed? A bunion may be diagnosed based on your symptoms, medical history, and activities. You may have tests, such as:  X-rays. These allow your health care provider to check the position of the bones in your foot and look for damage to your joint. They also help your health care provider determine the severity of your bunion and the best way to treat it.  Joint aspiration. In this test, a sample of fluid is removed from the toe joint. This test may be done if you are in a lot of pain. It helps rule out diseases that cause painful swelling of the joints, such as arthritis. How is this  treated? Treatment depends on the severity of your symptoms. The goal of treatment is to relieve symptoms and prevent the bunion from getting worse. Your health care provider may recommend:  Wearing shoes that have a wide toe box.  Using bunion pads to cushion the affected area.  Taping your toes together to keep them in a normal position.  Placing a device inside your shoe (orthotics) to help reduce pressure on your toe joint.  Taking medicine to ease pain, inflammation, and swelling.  Applying heat or ice to the affected area.  Doing stretching exercises.  Surgery to remove scar tissue and move the toes back into their normal position. This treatment is rare. Follow these instructions at home: Managing pain, stiffness, and swelling   If directed, put ice on the painful area: ? Put ice in a plastic bag. ? Place a towel between your skin and the bag. ? Leave the ice on for 20 minutes, 2-3 times a day. Activity   If directed, apply heat to the affected area before you exercise. Use the heat source that your health care provider recommends, such as a moist heat pack or a heating pad. ? Place a towel between your skin and the heat source. ? Leave the heat on for 20-30 minutes. ? Remove the heat if your skin turns bright red. This is especially important if you are unable to feel pain,   heat, or cold. You may have a greater risk of getting burned.  Do exercises as told by your health care provider. General instructions  Support your toe joint with proper footwear, shoe padding, or taping as told by your health care provider.  Take over-the-counter and prescription medicines only as told by your health care provider.  Keep all follow-up visits as told by your health care provider. This is important. Contact a health care provider if your symptoms:  Get worse.  Do not improve in 2 weeks. Get help right away if you have:  Severe pain and trouble with walking. Summary  A  bunion is a bump on the base of the big toe that forms when the bones of the big toe joint move out of position.  Bunions can make walking painful.  Treatment depends on the severity of your symptoms.  Support your toe joint with proper footwear, shoe padding, or taping as told by your health care provider. This information is not intended to replace advice given to you by your health care provider. Make sure you discuss any questions you have with your health care provider. Document Revised: 04/30/2018 Document Reviewed: 03/06/2018 Elsevier Patient Education  2020 [employer]vier [employer].  

## 2019-12-10 NOTE — Procedures (Signed)
[facility]  [place]  [place], [place] [address] Tel: [phone] Fax:  [phone] Test Date:  12/10/2019  Patient: Brooke Turner DOB: [id number] Physician: Narda Amber, DO  Sex: Female Height: 5\' 4"  Ref Phys: [name], MD  ID#: [id number] Temp: 32.0C Technician:    Patient Complaints: This is a 44 year old female referred for evaluation of left sided paresthesias.  NCV & EMG Findings: Extensive electrodiagnostic testing of the left upper extremity and lower extremity shows:  1. Left median sensory response shows prolonged latency (3.7 ms).  Left ulnar, sural, and superficial peroneal sensory responses are within normal limits.   2. Left median, ulnar, peroneal, and tibial motor responses are within normal limits.   3. Left tibial H reflex study is within normal limits.   4. There is no evidence of active or chronic motor axonal loss changes affecting any of the tested muscles.  Motor unit configuration and recruitment pattern is within normal limits.  Impression: 1. Left median neuropathy at or distal to the wrist (mild), consistent with a clinical diagnosis of carpal tunnel syndrome.  2. There is no evidence of a large fiber sensorimotor polyneuropathy or cervical/lumbosacral radiculopathy affecting the left side.   ___________________________ Narda Amber, DO    Nerve Conduction Studies Anti Sensory Summary Table   Site NR Peak (ms) Norm Peak (ms) P-T Amp (V) Norm P-T Amp  Left Median Anti Sensory (2nd Digit)  32C  Wrist    3.7 <3.4 29.2 >20  Left Sup Peroneal Anti Sensory (Ant Lat Mall)  32C  12 cm    2.5 <4.5 15.3 >5  Left Sural Anti Sensory (Lat Mall)  32C  Calf    2.8 <4.5 24.6 >5  Left Ulnar Anti Sensory (5th Digit)  32C  Wrist    2.2 <3.1 44.1 >12   Motor Summary Table   Site NR Onset (ms) Norm Onset (ms) O-P Amp (mV) Norm O-P Amp Site1 Site2 Delta-0 (ms) Dist (cm) Vel (m/s) Norm Vel (m/s)  Left Median Motor  (Abd Poll Brev)  32C  Wrist    3.9 <3.9 13.2 >6 Elbow Wrist 4.1 28.0 68 >50  Elbow    8.0  13.1         Left Peroneal Motor (Ext Dig Brev)  32C  Ankle    2.7 <5.5 8.8 >3 B Fib Ankle 6.6 35.0 53 >40  B Fib    9.3  8.8  Poplt B Fib 1.4 10.0 71 >40  Poplt    10.7  8.2         Left Tibial Motor (Abd Hall Brev)  32C  Ankle    3.8 <6.0 14.6 >8 Knee Ankle 6.6 41.0 62 >40  Knee    10.4  11.6         Left Ulnar Motor (Abd Dig Minimi)  32C  Wrist    2.1 <3.1 9.5 >7 B Elbow Wrist 3.4 22.0 65 >50  B Elbow    5.5  9.3  A Elbow B Elbow 1.6 10.0 63 >50  A Elbow    7.1  9.2          H Reflex Studies   NR H-Lat (ms) Lat Norm (ms) L-R H-Lat (ms)  Left Tibial (Gastroc)  32C     30.55 <35    EMG   Side Muscle Ins Act Fibs Psw Fasc Number Recrt Dur Dur. Amp Amp. Poly Poly. Comment  Left AntTibialis Nml Nml Nml Nml Nml  Nml Nml Nml Nml Nml Nml Nml N/A  Left Gastroc Nml Nml Nml Nml Nml Nml Nml Nml Nml Nml Nml Nml N/A  Left Flex Dig Long Nml Nml Nml Nml Nml Nml Nml Nml Nml Nml Nml Nml N/A  Left RectFemoris Nml Nml Nml Nml Nml Nml Nml Nml Nml Nml Nml Nml N/A  Left GluteusMed Nml Nml Nml Nml Nml Nml Nml Nml Nml Nml Nml Nml N/A  Left 1stDorInt Nml Nml Nml Nml Nml Nml Nml Nml Nml Nml Nml Nml N/A  Left Abd Poll Brev Nml Nml Nml Nml Nml Nml Nml Nml Nml Nml Nml Nml N/A  Left PronatorTeres Nml Nml Nml Nml Nml Nml Nml Nml Nml Nml Nml Nml N/A  Left Biceps Nml Nml Nml Nml Nml Nml Nml Nml Nml Nml Nml Nml N/A  Left Triceps Nml Nml Nml Nml Nml Nml Nml Nml Nml Nml Nml Nml N/A  Left Deltoid Nml Nml Nml Nml Nml Nml Nml Nml Nml Nml Nml Nml N/A      Waveforms:

## 2019-12-10 NOTE — Progress Notes (Signed)
Subjective: Brooke Turner is a 44 y.o. female patient who presents to office for evaluation of Right> Left bunion pain. Patient complains of progressive pain especially over the last year in the Right>Left foot that starts as pain over the bump with direct pressure and range of motion; patient now has difficulty fitting shoes comfortably with corns on 5th toes that rub. Reports that she wants to discuss treatment options and that the other doctor did not give her a choice except surgery. Patient denies any other pedal complaints.   Patient Active Problem List   Diagnosis Date Noted  . Costochondral junction syndrome 10/02/2019  . OSA (obstructive sleep apnea) 06/11/2019  . Encounter for examination following treatment at hospital 04/25/2019  . Hypokalemia 04/25/2019  . Elevated glucose 04/25/2019  . History of posttraumatic stress disorder (PTSD) 12/31/2018  . History of panic attacks 12/31/2018  . Palpitations   . Chest pain of uncertain etiology   . Chronic tension-type headache, not intractable 06/10/2015  . Dyspnea 03/18/2015  . Post-traumatic stress reaction 02/25/2015  . Anxiety disorder due to general medical condition with panic attack 02/25/2015  . Subserous leiomyoma of uterus 02/25/2015  . Anxiety about health 02/25/2015  . Deviated nasal septum 12/30/2014  . Chronic rhinitis 12/30/2014  . Chronic maxillary sinusitis 12/30/2014  . Snorings 09/30/2014  . Prediabetes 08/26/2014  . Acanthosis nigricans 03/19/2014  . Vitamin D deficiency 07/15/2013  . Large breasts 09/12/2012  . Essential hypertension 02/17/2012  . Obesity (BMI 30-39.9) previous BMI was over 40. Patient has lost 70 pounds in the last year 02/17/2012  . Anxiety disorder 02/17/2012  . Chronic back pain 02/17/2012    Current Outpatient Medications on File Prior to Visit  Medication Sig Dispense Refill  . acetaminophen (TYLENOL) 500 MG tablet Take 500 mg by mouth every 6 (six) hours as needed (back  pain/ cramps).     . ALPRAZolam (NIRAVAM) 0.25 MG dissolvable tablet Take 0.25 mg by mouth at bedtime as needed for anxiety.    Marland Kitchen atenolol (TENORMIN) 25 MG tablet Take 6.25 mg by mouth 2 (two) times daily.  180 tablet 3  . blood glucose meter kit and supplies Test blood sugar twice daily. Dx code: R73.09 1 each 0  . busPIRone (BUSPAR) 10 MG tablet Take 10 mg by mouth daily.    . cholecalciferol (VITAMIN D3) 25 MCG (1000 UNIT) tablet Take 1,000 Units by mouth daily.    . Cyanocobalamin (VITAMIN B-12 PO) Take 1 tablet by mouth daily.    Marland Kitchen ELDERBERRY PO Take 1 capsule by mouth daily.    . famotidine (PEPCID) 20 MG tablet Take 1 tablet (20 mg total) by mouth 2 (two) times daily. 180 tablet 0  . fluticasone (FLONASE) 50 MCG/ACT nasal spray Place 2 sprays into both nostrils daily. (Patient taking differently: Place 2 sprays into both nostrils daily as needed (seasonal allergies). ) 16 g 0  . glucose blood (ONETOUCH VERIO) test strip TEST BLOOD SUGAR DAILY 25 each 10  . levalbuterol (XOPENEX HFA) 45 MCG/ACT inhaler Inhale 1-2 puffs every 6 hours if needed- rescue inhaler (Patient taking differently: Inhale 1-2 puffs into the lungs every 6 (six) hours as needed for wheezing. ) 1 Inhaler 12  . LORazepam (ATIVAN) 0.5 MG tablet Take 1 tablet (0.5 mg total) by mouth every 6 (six) hours as needed for anxiety. (Patient taking differently: Take 0.25 mg by mouth 3 (three) times daily as needed for anxiety. ) 15 tablet 0  . Multiple Vitamins-Minerals (MULTIVITAMIN GUMMIES  ADULT PO) Take 2 tablets by mouth daily.     . Norethindrone-Ethinyl Estradiol-Fe Biphas (LO LOESTRIN FE) 1 MG-10 MCG / 10 MCG tablet Take 1 tablet by mouth at bedtime.     [name] DELICA LANCETS 59D MISC USE TO TEST BLOOD SUGAR DAILY 100 each 3  . pantoprazole (PROTONIX) 40 MG tablet TAKE 1 TABLET BY MOUTH ONCE DAILY BEFORE BREAKFAST (Patient taking differently: Take 40 mg by mouth daily. ) 90 tablet 0   Current Facility-Administered  Medications on File Prior to Visit  Medication Dose Route Frequency Provider Last Rate Last Admin  . cyanocobalamin ((VITAMIN B-12)) injection 1,000 mcg  1,000 mcg Intramuscular Q30 days [name], MD   1,000 mcg at 10/19/18 1837    Allergies  Allergen Reactions  . Penicillins Hives    Has patient had a PCN reaction causing immediate rash, facial/tongue/throat swelling, SOB or lightheadedness with hypotension:Yes Has patient had a PCN reaction causing severe rash involving mucus membranes or skin necrosis:unsure Has patient had a PCN reaction that required hospitalization: Yes Has patient had a PCN reaction occurring within the last 10 years: No If all of the above answers are "NO", then may proceed with Cephalosporin use.     . Tamiflu [Oseltamivir Phosphate] Nausea And Vomiting    Excessive vomiting  . Tinidazole Other (See Comments)    Caused severe abd pain/type of colitis   . Diltiazem Other (See Comments)    Dizzy, felt like she was going to pass out Blood pressure went up per patient     Objective:  General: Alert and oriented x3 in no acute distress  Dermatology: No open lesions bilateral lower extremities, no webspace macerations, no ecchymosis bilateral, all nails x 10 are well manicured. +Corn to right>left 5th toe.   Vascular: Dorsalis Pedis and Posterior Tibial pedal pulses 2/4, Capillary Fill Time 3 seconds, (+) pedal hair growth bilateral, no edema bilateral lower extremities, Temperature gradient within normal limits.  Neurology: Johney Maine sensation intact via light touch bilateral.   Musculoskeletal: Mild tenderness with palpation right>left bunion 1st over 5th deformity, no limitation or crepitus with range of motion, deformity reducible, tracking not trackbound, there is no 1st ray hypermobility noted bilateral. Midtarsal, Subtalar joint, and ankle joint range of motion is within normal limits. On weightbearing exam, there is decreased 1st MTPJ rom Right>Left with  functional limitus noted, there is medial arch collapse Right> Left on weightbearing, rearfoot slight valgus, forefoot slight abduction with HAV deformity supported on ground with no second toe crossover deformity, hammertoe 5th toe with corn noted bilateral.    Assessment and Plan: Problem List Items Addressed This Visit    None    Visit Diagnoses    Valgus deformity of both great toes    -  Primary   Hammer toes of both feet       Corn of toe       Foot pain, bilateral       R>L       -Complete examination performed -Previous Xrays reviewed -Discussed treatement options; discussed HAV and hammertoe with corn deformity;conservative and  Surgical management; risks, benefits, alternatives discussed. All patient's questions answered. -Dispensed toe alignment splint on right -Dispensed bunion shields to use bilateral  -Recommend continue with good supportive shoes and inserts.  -Patient to return to office as needed if no better after 4-6weeks or sooner if condition worsens. Advised patient to if fails to get better would benefit from surgery  Landis Martins, DPM

## 2019-12-12 ENCOUNTER — Ambulatory Visit: Payer: BC Managed Care – PPO | Admitting: Physical Therapy

## 2019-12-13 ENCOUNTER — Emergency Department (HOSPITAL_COMMUNITY)
Admission: EM | Admit: 2019-12-13 | Discharge: 2019-12-14 | Disposition: A | Discharge status: Home / Self Care | Payer: BC Managed Care – PPO | Attending: Emergency Medicine | Admitting: Emergency Medicine

## 2019-12-13 ENCOUNTER — Telehealth: Payer: Self-pay

## 2019-12-13 ENCOUNTER — Other Ambulatory Visit: Payer: Self-pay

## 2019-12-13 DIAGNOSIS — Z87891 Personal history of nicotine dependence: Secondary | ICD-10-CM | POA: Diagnosis not present

## 2019-12-13 DIAGNOSIS — I10 Essential (primary) hypertension: Secondary | ICD-10-CM | POA: Diagnosis not present

## 2019-12-13 DIAGNOSIS — Z793 Long term (current) use of hormonal contraceptives: Secondary | ICD-10-CM | POA: Diagnosis not present

## 2019-12-13 DIAGNOSIS — R079 Chest pain, unspecified: Secondary | ICD-10-CM

## 2019-12-13 DIAGNOSIS — R0789 Other chest pain: Secondary | ICD-10-CM | POA: Insufficient documentation

## 2019-12-13 NOTE — Telephone Encounter (Signed)
Pt called no answer voice mail left for her to call back

## 2019-12-13 NOTE — Telephone Encounter (Signed)
Pt called went over EMG results

## 2019-12-13 NOTE — ED Triage Notes (Signed)
Pt c/o CP x 2 days; took a 1/2 aspirin and states that this helped. Pt c/o pain behind left breast and neck tightness/pain and nausea.

## 2019-12-14 ENCOUNTER — Emergency Department (HOSPITAL_COMMUNITY): Payer: BC Managed Care – PPO

## 2019-12-14 DIAGNOSIS — R079 Chest pain, unspecified: Secondary | ICD-10-CM | POA: Diagnosis not present

## 2019-12-14 LAB — BASIC METABOLIC PANEL
Anion gap: 10 (ref 5–15)
BUN: 15 mg/dL (ref 6–20)
CO2: 24 mmol/L (ref 22–32)
Calcium: 8.8 mg/dL — ABNORMAL LOW (ref 8.9–10.3)
Chloride: 106 mmol/L (ref 98–111)
Creatinine, Ser: 1.14 mg/dL — ABNORMAL HIGH (ref 0.44–1.00)
GFR calc Af Amer: >60 mL/min (ref >60)
GFR calc non Af Amer: 58 mL/min — ABNORMAL LOW (ref >60)
Glucose, Bld: 134 mg/dL — ABNORMAL HIGH (ref 70–99)
Potassium: 3.5 mmol/L (ref 3.5–5.1)
Sodium: 140 mmol/L (ref 135–145)

## 2019-12-14 LAB — CBC
HCT: 39.4 % (ref 36.0–46.0)
Hemoglobin: 13.2 g/dL (ref 12.0–15.0)
MCH: 30.2 pg (ref 26.0–34.0)
MCHC: 33.5 g/dL (ref 30.0–36.0)
MCV: 90.2 fL (ref 80.0–100.0)
Platelets: 270 10*3/uL (ref 150–400)
RBC: 4.37 MIL/uL (ref 3.87–5.11)
RDW: 12.3 % (ref 11.5–15.5)
WBC: 7.4 10*3/uL (ref 4.0–10.5)
nRBC: 0 % (ref 0.0–0.2)

## 2019-12-14 LAB — TROPONIN I (HIGH SENSITIVITY)
Troponin I (High Sensitivity): <2 ng/L (ref <18)
Troponin I (High Sensitivity): <2 ng/L (ref <18)

## 2019-12-14 LAB — D-DIMER, QUANTITATIVE: D-Dimer, Quant: 0.3 ug/mL-FEU (ref 0.00–0.50)

## 2019-12-14 NOTE — ED Provider Notes (Signed)
[facility] Emergency Department Provider Note MRN:  [id number]  Arrival date & time: 12/14/19     Chief Complaint   Chest Pain   History of Present Illness   Brooke Turner is a 44 y.o. year-old female with a history of chest pain, hypertension, GERD presenting to the ED with chief complaint of chest pain.  Left-sided chest pain, described as sharp sometimes but burning other times.  Usually is intermittent but today has been constant.  Worse than normal.  Denies dizziness or diaphoresis, no shortness of breath, no nausea or vomiting, no abdominal pain.  No leg pain or swelling.  Review of Systems  A complete 10 system review of systems was obtained and all systems are negative except as noted in the HPI and PMH.   Patient's Health History    Past Medical History:  Diagnosis Date  . Anxiety   . Back pain   . Chest pain    a. 09/2014 Echo: EF 60-65%, no rwma, nl valves;  b. 12/2014 Neg ETT.  . Essential hypertension    a. 01/2016 Renal duplex: no RAS- ? renal cyst (renal u/s 4/17 - no cyst, nl study); b. 02/2016 24 hr BP Monitor: Mean SBP ~ 138 with Max of 198. Highest pressures noted in evening hours following placement of cuff.  . Fibroid uterus   . GERD (gastroesophageal reflux disease)   . Palpitations    a. 09/2014 nl event monitor.    Past Surgical History:  Procedure Laterality Date  . WISDOM TOOTH EXTRACTION      Family History  Problem Relation Age of Onset  . Seizures Mother   . Epilepsy Mother   . Cancer Father        Liver  . Diabetes Father 68  . Hypertension Father   . Heart disease Father 75       CEA, LE Stenting  . Alzheimer's disease Maternal Grandmother   . Heart disease Maternal Grandfather   . Colon cancer Neg Hx   . Breast cancer Neg Hx     Social History   Socioeconomic History  . Marital status: Single    Spouse name: N/A  . Number of children: 0  . Years of education: 80  . Highest education level:  Not on file  Occupational History  . Occupation: Research scientist (physical sciences): [employer]  Tobacco Use  . Smoking status: Former Smoker    Packs/day: 0.25    Years: 10.00    Pack years: 2.50    Types: Cigarettes    Start date: 06/25/2000    Quit date: 08/19/2014    Years since quitting: 5.3  . Smokeless tobacco: Never Used  Substance and Sexual Activity  . Alcohol use: No    Alcohol/week: 0.0 standard drinks  . Drug use: No  . Sexual activity: Not Currently    Partners: Male    Birth control/protection: Pill  Other Topics Concern  . Not on file  Social History Narrative   Patient lives at home alone .   Patient works full time at [employer]   Education college.   Right handed.   Caffeine none   Social Determinants of Health   Financial Resource Strain:   . Difficulty of Paying Living Expenses: Not on file  Food Insecurity:   . Worried About Charity fundraiser in the Last Year: Not on file  . Ran Out of Food in the Last Year: Not on file  Transportation  Needs:   . Lack of Transportation (Medical): Not on file  . Lack of Transportation (Non-Medical): Not on file  Physical Activity:   . Days of Exercise per Week: Not on file  . Minutes of Exercise per Session: Not on file  Stress:   . Feeling of Stress : Not on file  Social Connections:   . Frequency of Communication with Friends and Family: Not on file  . Frequency of Social Gatherings with Friends and Family: Not on file  . Attends Religious Services: Not on file  . Active Member of Clubs or Organizations: Not on file  . Attends Archivist Meetings: Not on file  . Marital Status: Not on file  Intimate Partner Violence:   . Fear of Current or Ex-Partner: Not on file  . Emotionally Abused: Not on file  . Physically Abused: Not on file  . Sexually Abused: Not on file     Physical Exam   Vitals:   12/13/19 2348 12/14/19 0320  BP: (!) 172/89 (!) 141/81  Pulse: (!) 105 76  Resp: 16 17  Temp:  98.1 F (36.7 C)   SpO2: 100% 100%    CONSTITUTIONAL: Well-appearing, NAD NEURO:  Alert and oriented x 3, no focal deficits EYES:  eyes equal and reactive ENT/NECK:  no LAD, no JVD CARDIO: Tachycardic rate, well-perfused, normal S1 and S2 PULM:  CTAB no wheezing or rhonchi GI/GU:  normal bowel sounds, non-distended, non-tender MSK/SPINE:  No gross deformities, no edema SKIN:  no rash, atraumatic PSYCH:  Appropriate speech and behavior  *Additional and/or pertinent findings included in MDM below  Diagnostic and Interventional Summary    EKG Interpretation  Date/Time:  Friday December 13 2019 23:47:44 EST Ventricular Rate:  83 PR Interval:  154 QRS Duration: 82 QT Interval:  346 QTC Calculation: 406 R Axis:   49 Text Interpretation: Normal sinus rhythm Normal ECG Confirmed by Gerlene Fee [phone]) on 12/14/2019 3:05:34 AM      Cardiac Monitoring Interpretation:  Labs Reviewed  BASIC METABOLIC PANEL - Abnormal; Notable for the following components:      Result Value   Glucose, Bld 134 (*)    Creatinine, Ser 1.14 (*)    Calcium 8.8 (*)    GFR calc non Af Amer 58 (*)    All other components within normal limits  CBC  D-DIMER, QUANTITATIVE (NOT AT [facility])  I-STAT BETA HCG BLOOD, ED ([facility], [facility], [facility] ONLY)  TROPONIN I (HIGH SENSITIVITY)  TROPONIN I (HIGH SENSITIVITY)    DG Chest 2 View  Final Result      Medications - No data to display   Procedures  /  Critical Care Procedures  ED Course and Medical Decision Making  I have reviewed the triage vital signs, the nursing notes, and pertinent available records from the EMR.  Pertinent labs & imaging results that were available during my care of the patient were reviewed by me and considered in my medical decision making (see below for details).     Multiple prior ED visits for atypical chest pain, no recent D-dimers or CTA chest evaluations.  Patient endorses a strong family history of blood clots and she uses birth control  pills and she arrives tachycardic, however concern for PE is still low.  Will screen with D-dimer.  4:30 AM update: D-dimer negative, troponins negative x2, appropriate for discharge.  Barth Kirks. Sedonia Small, MD [place] [email or web address]  Final Clinical Impressions(s) / ED Diagnoses  ICD-10-CM   1. Chest pain, unspecified type  R07.9     ED Discharge Orders    None       Discharge Instructions Discussed with and Provided to Patient:     Discharge Instructions     You were evaluated in the Emergency Department and after careful evaluation, we did not find any emergent condition requiring admission or further testing in the hospital.  Your exam/testing today is overall reassuring.  Please return to the Emergency Department if you experience any worsening of your condition.  We encourage you to follow up with a primary care provider.  Thank you for allowing [place] to be a part of your care.       Maudie Flakes, MD 12/14/19 0430

## 2019-12-14 NOTE — Discharge Instructions (Addendum)
You were evaluated in the Emergency Department and after careful evaluation, we did not find any emergent condition requiring admission or further testing in the hospital.  Your exam/testing today is overall reassuring.  Please return to the Emergency Department if you experience any worsening of your condition.  We encourage you to follow up with a primary care provider.  Thank you for allowing us to be a part of your care. 

## 2019-12-15 ENCOUNTER — Telehealth: Payer: Self-pay | Admitting: Hematology

## 2019-12-15 NOTE — Telephone Encounter (Signed)
Pt is allergic to penicillin and would like to know if she can still get covid 19 vaccine

## 2019-12-15 NOTE — Telephone Encounter (Signed)
Informed pt that she may take the covid shot when available to her specific roll out of the vaccine. Pt informed currently only people 65 and above are getting vaccinated. Advised pt to refer to [facility] page for roll out information.  Pt having sx of covid and is going to schedule testing. Pt verbalized understanding.

## 2019-12-16 ENCOUNTER — Ambulatory Visit: Payer: BC Managed Care – PPO | Attending: Internal Medicine

## 2019-12-16 DIAGNOSIS — Z20822 Contact with and (suspected) exposure to covid-19: Secondary | ICD-10-CM

## 2019-12-17 LAB — NOVEL CORONAVIRUS, NAA: SARS-CoV-2, NAA: NOT DETECTED

## 2019-12-20 ENCOUNTER — Ambulatory Visit: Payer: BC Managed Care – PPO | Attending: Neurology | Admitting: Physical Therapy

## 2019-12-27 ENCOUNTER — Ambulatory Visit: Payer: BC Managed Care – PPO | Admitting: Physical Therapy

## 2020-01-03 DIAGNOSIS — R0989 Other specified symptoms and signs involving the circulatory and respiratory systems: Secondary | ICD-10-CM | POA: Diagnosis not present

## 2020-01-03 DIAGNOSIS — R0789 Other chest pain: Secondary | ICD-10-CM | POA: Diagnosis not present

## 2020-01-03 DIAGNOSIS — R079 Chest pain, unspecified: Secondary | ICD-10-CM | POA: Diagnosis not present

## 2020-01-03 DIAGNOSIS — F418 Other specified anxiety disorders: Secondary | ICD-10-CM | POA: Diagnosis not present

## 2020-01-03 DIAGNOSIS — I10 Essential (primary) hypertension: Secondary | ICD-10-CM | POA: Diagnosis not present

## 2020-01-03 DIAGNOSIS — R002 Palpitations: Secondary | ICD-10-CM | POA: Diagnosis not present

## 2020-01-06 ENCOUNTER — Telehealth: Payer: Self-pay | Admitting: Cardiology

## 2020-01-06 HISTORY — PX: ESOPHAGOGASTRODUODENOSCOPY: SHX1529

## 2020-01-06 NOTE — Telephone Encounter (Signed)
New message     Pt c/o medication issue:  1. Name of Medication: Nitroglycerin   2. How are you currently taking this medication (dosage and times per day)? Not taking   3. Are you having a reaction (difficulty breathing--STAT)? No   4. What is your medication issue? Pt is calling and would like to speak with Sharyn Lull. She says they have discussed this medication before     Please call

## 2020-01-06 NOTE — Telephone Encounter (Signed)
Contacted patient- she states that she has been seen by [facility] and they suggested that she had angina, and to place her on Imdur- she was asking questions regarding what we would stop if she started, and why this had not been brought up to her. I explained to patient that angina was chest pain and if they suggested for her to take Imdur they would discuss the pros and cons of the medication with her. Patient verbalized understanding.

## 2020-01-08 ENCOUNTER — Other Ambulatory Visit: Payer: BC Managed Care – PPO

## 2020-01-11 ENCOUNTER — Other Ambulatory Visit: Payer: Self-pay

## 2020-01-11 ENCOUNTER — Emergency Department (HOSPITAL_COMMUNITY): Payer: BC Managed Care – PPO

## 2020-01-11 ENCOUNTER — Emergency Department (HOSPITAL_COMMUNITY)
Admission: EM | Admit: 2020-01-11 | Discharge: 2020-01-11 | Disposition: A | Discharge status: Home / Self Care | Payer: BC Managed Care – PPO | Attending: Emergency Medicine | Admitting: Emergency Medicine

## 2020-01-11 ENCOUNTER — Emergency Department (HOSPITAL_BASED_OUTPATIENT_CLINIC_OR_DEPARTMENT_OTHER): Payer: BC Managed Care – PPO

## 2020-01-11 DIAGNOSIS — M79609 Pain in unspecified limb: Secondary | ICD-10-CM | POA: Diagnosis not present

## 2020-01-11 DIAGNOSIS — R0600 Dyspnea, unspecified: Secondary | ICD-10-CM | POA: Diagnosis not present

## 2020-01-11 DIAGNOSIS — R079 Chest pain, unspecified: Secondary | ICD-10-CM | POA: Diagnosis not present

## 2020-01-11 DIAGNOSIS — Z79899 Other long term (current) drug therapy: Secondary | ICD-10-CM | POA: Insufficient documentation

## 2020-01-11 DIAGNOSIS — R002 Palpitations: Secondary | ICD-10-CM | POA: Diagnosis not present

## 2020-01-11 DIAGNOSIS — I10 Essential (primary) hypertension: Secondary | ICD-10-CM | POA: Diagnosis not present

## 2020-01-11 DIAGNOSIS — R1013 Epigastric pain: Secondary | ICD-10-CM | POA: Diagnosis not present

## 2020-01-11 DIAGNOSIS — R109 Unspecified abdominal pain: Secondary | ICD-10-CM

## 2020-01-11 DIAGNOSIS — M79605 Pain in left leg: Secondary | ICD-10-CM | POA: Diagnosis not present

## 2020-01-11 DIAGNOSIS — R0602 Shortness of breath: Secondary | ICD-10-CM | POA: Diagnosis not present

## 2020-01-11 DIAGNOSIS — Z87891 Personal history of nicotine dependence: Secondary | ICD-10-CM | POA: Insufficient documentation

## 2020-01-11 DIAGNOSIS — M79662 Pain in left lower leg: Secondary | ICD-10-CM | POA: Diagnosis not present

## 2020-01-11 LAB — BASIC METABOLIC PANEL
Anion gap: 11 (ref 5–15)
BUN: 8 mg/dL (ref 6–20)
CO2: 23 mmol/L (ref 22–32)
Calcium: 8.7 mg/dL — ABNORMAL LOW (ref 8.9–10.3)
Chloride: 107 mmol/L (ref 98–111)
Creatinine, Ser: 1.16 mg/dL — ABNORMAL HIGH (ref 0.44–1.00)
GFR calc Af Amer: >60 mL/min (ref >60)
GFR calc non Af Amer: 57 mL/min — ABNORMAL LOW (ref >60)
Glucose, Bld: 102 mg/dL — ABNORMAL HIGH (ref 70–99)
Potassium: 3.5 mmol/L (ref 3.5–5.1)
Sodium: 141 mmol/L (ref 135–145)

## 2020-01-11 LAB — CBC
HCT: 39.7 % (ref 36.0–46.0)
Hemoglobin: 13.4 g/dL (ref 12.0–15.0)
MCH: 30.1 pg (ref 26.0–34.0)
MCHC: 33.8 g/dL (ref 30.0–36.0)
MCV: 89.2 fL (ref 80.0–100.0)
Platelets: 262 10*3/uL (ref 150–400)
RBC: 4.45 MIL/uL (ref 3.87–5.11)
RDW: 12.2 % (ref 11.5–15.5)
WBC: 6.5 10*3/uL (ref 4.0–10.5)
nRBC: 0 % (ref 0.0–0.2)

## 2020-01-11 LAB — TROPONIN I (HIGH SENSITIVITY)
Troponin I (High Sensitivity): 2 ng/L (ref <18)
Troponin I (High Sensitivity): 3 ng/L (ref <18)

## 2020-01-11 LAB — CK: Total CK: 310 U/L — ABNORMAL HIGH (ref 38–234)

## 2020-01-11 LAB — I-STAT BETA HCG BLOOD, ED (MC, WL, AP ONLY): I-stat hCG, quantitative: <5 m[IU]/mL (ref <5)

## 2020-01-11 MED ORDER — SUCRALFATE 1 G PO TABS
1.0000 g | ORAL_TABLET | Freq: Once | ORAL | Status: AC
  Administered 2020-01-11: 1 g via ORAL
  Filled 2020-01-11: qty 1

## 2020-01-11 MED ORDER — SUCRALFATE 1 G PO TABS: 1.0000 g | ORAL_TABLET | Freq: Three times a day (TID) | ORAL | 0 refills | Status: DC

## 2020-01-11 MED ORDER — SODIUM CHLORIDE 0.9% FLUSH: 3.0000 mL | Freq: Once | INTRAVENOUS | Status: DC

## 2020-01-11 NOTE — ED Triage Notes (Signed)
Pt here for L leg pain  X1 month with worsening over the last few days. Also c/o chest burning and some difficulty breathing. Pt able to speak in complete sentences.

## 2020-01-11 NOTE — Discharge Instructions (Signed)
We saw in the ER for abdominal pain and leg pain.  All the work-up in the ER is normal. There is no evidence of blood clot in your legs, cardiac enzymes are normal, no gallstones seen and there is no signs of infection.  There are other possible reasons for you to have the type of discomfort you are having. We recommend that you follow-up with your primary care doctor for further evaluation.

## 2020-01-11 NOTE — ED Provider Notes (Addendum)
[facility] EMERGENCY DEPARTMENT Provider Note   CSN: [id number] Arrival date & time: 01/11/20  1749     History No chief complaint on file.   Brooke Turner is a 44 y.o. female.  HPI    44 year old comes in a chief complaint of abdominal pain, leg pain. Patient reports 3-day history of epigastric abdominal pain, described as sharp pain that is worse with food intake.  She also often gets burning type pain in her chest.  She is already taking pantoprazole.  She has no history of similar pain.  Additionally patient is complaining of left thigh pain and swelling.  She has noticed discomfort in her left lower extremity for the last month and her symptoms have progressed to the point where she sometimes have to limp.   Past Medical History:  Diagnosis Date  . Anxiety   . Back pain   . Chest pain    a. 09/2014 Echo: EF 60-65%, no rwma, nl valves;  b. 12/2014 Neg ETT.  . Essential hypertension    a. 01/2016 Renal duplex: no RAS- ? renal cyst (renal u/s 4/17 - no cyst, nl study); b. 02/2016 24 hr BP Monitor: Mean SBP ~ 138 with Max of 198. Highest pressures noted in evening hours following placement of cuff.  . Fibroid uterus   . GERD (gastroesophageal reflux disease)   . Palpitations    a. 09/2014 nl event monitor.    Patient Active Problem List   Diagnosis Date Noted  . Costochondral junction syndrome 10/02/2019  . OSA (obstructive sleep apnea) 06/11/2019  . Encounter for examination following treatment at hospital 04/25/2019  . Hypokalemia 04/25/2019  . Elevated glucose 04/25/2019  . History of posttraumatic stress disorder (PTSD) 12/31/2018  . History of panic attacks 12/31/2018  . Palpitations   . Chest pain of uncertain etiology   . Chronic tension-type headache, not intractable 06/10/2015  . Dyspnea 03/18/2015  . Post-traumatic stress reaction 02/25/2015  . Anxiety disorder due to general medical condition with panic attack 02/25/2015  .  Subserous leiomyoma of uterus 02/25/2015  . Anxiety about health 02/25/2015  . Deviated nasal septum 12/30/2014  . Chronic rhinitis 12/30/2014  . Chronic maxillary sinusitis 12/30/2014  . Snorings 09/30/2014  . Prediabetes 08/26/2014  . Acanthosis nigricans 03/19/2014  . Vitamin D deficiency 07/15/2013  . Large breasts 09/12/2012  . Essential hypertension 02/17/2012  . Obesity (BMI 30-39.9) previous BMI was over 40. Patient has lost 70 pounds in the last year 02/17/2012  . Anxiety disorder 02/17/2012  . Chronic back pain 02/17/2012    Past Surgical History:  Procedure Laterality Date  . WISDOM TOOTH EXTRACTION       OB History    Gravida  2   Para  0   Term  0   Preterm  0   AB  2   Living  0     SAB  1   TAB  1   Ectopic  0   Multiple  0   Live Births              Family History  Problem Relation Age of Onset  . Seizures Mother   . Epilepsy Mother   . Cancer Father        Liver  . Diabetes Father 3  . Hypertension Father   . Heart disease Father 8       CEA, LE Stenting  . Alzheimer's disease Maternal Grandmother   . Heart disease Maternal  Grandfather   . Colon cancer Neg Hx   . Breast cancer Neg Hx     Social History   Tobacco Use  . Smoking status: Former Smoker    Packs/day: 0.25    Years: 10.00    Pack years: 2.50    Types: Cigarettes    Start date: 06/25/2000    Quit date: 08/19/2014    Years since quitting: 5.4  . Smokeless tobacco: Never Used  Substance Use Topics  . Alcohol use: No    Alcohol/week: 0.0 standard drinks  . Drug use: No    Home Medications Prior to Admission medications   Medication Sig Start Date End Date Taking? Authorizing Provider  acetaminophen (TYLENOL) 500 MG tablet Take 500 mg by mouth every 6 (six) hours as needed (back pain/ cramps).    Yes [provider]  atenolol (TENORMIN) 25 MG tablet Take 6.25 mg by mouth 2 (two) times daily.  04/25/19  Yes Alvstad, Kristin L, RPH-CPP  busPIRone  (BUSPAR) 10 MG tablet Take 10 mg by mouth daily.   Yes [provider]  cholecalciferol (VITAMIN D3) 25 MCG (1000 UNIT) tablet Take 1,000 Units by mouth daily.   Yes [provider]  Cyanocobalamin (VITAMIN B-12 PO) Take 1 tablet by mouth daily.   Yes [provider]  ELDERBERRY PO Take 1 capsule by mouth daily.   Yes [provider]  famotidine (PEPCID) 20 MG tablet Take 1 tablet (20 mg total) by mouth 2 (two) times daily. 11/20/18  Yes [name], MD  fluticasone [facility]) 50 MCG/ACT nasal spray Place 2 sprays into both nostrils daily. Patient taking differently: Place 2 sprays into both nostrils daily as needed (seasonal allergies).  10/10/17  Yes Tenna Delaine D, PA-C  levalbuterol [facility] HFA) 45 MCG/ACT inhaler Inhale 1-2 puffs every 6 hours if needed- rescue inhaler Patient taking differently: Inhale 1-2 puffs into the lungs every 6 (six) hours as needed for wheezing.  05/29/19  Yes Young, Kasandra Knudsen, MD  LORazepam (ATIVAN) 0.5 MG tablet Take 1 tablet (0.5 mg total) by mouth every 6 (six) hours as needed for anxiety. Patient taking differently: Take 0.25 mg by mouth 3 (three) times daily as needed for anxiety.  03/19/19  Yes Forrest Moron, MD  Multiple Vitamins-Minerals (MULTIVITAMIN GUMMIES ADULT PO) Take 2 tablets by mouth daily.    Yes [provider]  Norethindrone-Ethinyl Estradiol-Fe Biphas (LO LOESTRIN FE) 1 MG-10 MCG / 10 MCG tablet Take 1 tablet by mouth at bedtime.    Yes [provider]  pantoprazole (PROTONIX) 40 MG tablet TAKE 1 TABLET BY MOUTH ONCE DAILY BEFORE BREAKFAST Patient taking differently: Take 40 mg by mouth daily.  10/08/19  Yes Thornton Park, MD  blood glucose meter kit and supplies Test blood sugar twice daily. Dx code: R73.09 04/25/19   Maryruth Hancock, MD  glucose blood [facility] VERIO) test strip TEST BLOOD SUGAR DAILY 09/02/19   Forrest Moron, MD  [facility] DELICA LANCETS 99J MISC USE TO TEST BLOOD  SUGAR DAILY 11/16/18   [name], MD  sucralfate (CARAFATE) 1 g tablet Take 1 tablet (1 g total) by mouth 4 (four) times daily -  with meals and at bedtime. 01/11/20   Varney Biles, MD    Allergies    Penicillins, Tamiflu [oseltamivir phosphate], Tinidazole, and Diltiazem  Review of Systems   Review of Systems  Constitutional: Positive for activity change.  Respiratory: Negative for shortness of breath.   Cardiovascular: Positive for  chest pain.  Gastrointestinal: Positive for abdominal pain.  Musculoskeletal: Positive for myalgias.  Allergic/Immunologic: Negative for immunocompromised state.  All other systems reviewed and are negative.   Physical Exam Updated Vital Signs BP (!) 141/91 (BP Location: Left Arm)   Pulse 88   Temp 98.9 F (37.2 C) (Oral)   Resp 20   SpO2 100%   Physical Exam Vitals and nursing note reviewed.  Constitutional:      Appearance: She is well-developed.  HENT:     Head: Normocephalic and atraumatic.  Cardiovascular:     Rate and Rhythm: Normal rate.  Pulmonary:     Effort: Pulmonary effort is normal.  Abdominal:     General: Bowel sounds are normal.     Tenderness: There is abdominal tenderness.  Musculoskeletal:        General: No swelling or tenderness.     Cervical back: Normal range of motion and neck supple.  Skin:    General: Skin is warm and dry.  Neurological:     Mental Status: She is alert and oriented to person, place, and time.     ED Results / Procedures / Treatments   Labs (all labs ordered are listed, but only abnormal results are displayed) Labs Reviewed  BASIC METABOLIC PANEL - Abnormal; Notable for the following components:      Result Value   Glucose, Bld 102 (*)    Creatinine, Ser 1.16 (*)    Calcium 8.7 (*)    GFR calc non Af Amer 57 (*)    All other components within normal limits  CK - Abnormal; Notable for the following components:   Total CK 310 (*)    All other components within normal limits  CBC    I-STAT BETA HCG BLOOD, ED ([facility], [facility], [facility] ONLY)  TROPONIN I (HIGH SENSITIVITY)  TROPONIN I (HIGH SENSITIVITY)    EKG None  Radiology DG Chest 2 View  Result Date: 01/11/2020 CLINICAL DATA:  Chest pain, dyspnea, and nausea for 3 days. Palpitations. EXAM: CHEST - 2 VIEW COMPARISON:  12/13/2019 FINDINGS: The heart size and mediastinal contours are within normal limits. Both lungs are clear. Heart monitor device seen in the left anterior chest wall. IMPRESSION: No active cardiopulmonary disease. Electronically Signed   By: Marlaine Hind M.D.   On: 01/11/2020 18:45   VAS [place] LOWER EXTREMITY VENOUS (DVT) ([facility] and [facility] 7a-7p)  Result Date: 01/11/2020  Lower Venous DVTStudy Indications: Posterior thigh pain.  Comparison Study: Prior study from 08/16/19 is available for comparison. Performing Technologist: Sharion Dove RVS  Examination Guidelines: A complete evaluation includes B-mode imaging, spectral Doppler, color Doppler, and power Doppler as needed of all accessible portions of each vessel. Bilateral testing is considered an integral part of a complete examination. Limited examinations for reoccurring indications may be performed as noted. The reflux portion of the exam is performed with the patient in reverse Trendelenburg.  +-----+---------------+---------+-----------+----------+--------------+ RIGHTCompressibilityPhasicitySpontaneityPropertiesThrombus Aging +-----+---------------+---------+-----------+----------+--------------+ CFV  Full           Yes      Yes                                 +-----+---------------+---------+-----------+----------+--------------+   +---------+---------------+---------+-----------+----------+--------------+ LEFT     CompressibilityPhasicitySpontaneityPropertiesThrombus Aging +---------+---------------+---------+-----------+----------+--------------+ CFV      Full           Yes      Yes                                  +---------+---------------+---------+-----------+----------+--------------+  SFJ      Full                                                        +---------+---------------+---------+-----------+----------+--------------+ FV Prox  Full                                                        +---------+---------------+---------+-----------+----------+--------------+ FV Mid   Full                                                        +---------+---------------+---------+-----------+----------+--------------+ FV DistalFull                                                        +---------+---------------+---------+-----------+----------+--------------+ PFV      Full                                                        +---------+---------------+---------+-----------+----------+--------------+ POP      Full           Yes      Yes                                 +---------+---------------+---------+-----------+----------+--------------+ PTV      Full                                                        +---------+---------------+---------+-----------+----------+--------------+ PERO     Full                                                        +---------+---------------+---------+-----------+----------+--------------+ GSV      Full                                                        +---------+---------------+---------+-----------+----------+--------------+ SSV      Full                                                        +---------+---------------+---------+-----------+----------+--------------+  Summary: RIGHT: - No evidence of deep vein thrombosis in the lower extremity. No indirect evidence of obstruction proximal to the inguinal ligament.  LEFT: - No evidence of deep vein thrombosis in the lower extremity. No indirect evidence of obstruction proximal to the inguinal ligament. - Findings appear essentially unchanged compared to previous  examination.  *See table(s) above for measurements and observations.    Preliminary    US Abdomen Limited RUQ  Result Date: 01/11/2020 CLINICAL DATA:  Abdominal pain. Additional history provided by scanning technologist: Epigastric pain for 3 days. EXAM: ULTRASOUND ABDOMEN LIMITED RIGHT UPPER QUADRANT COMPARISON:  CT abdomen/pelvis 10/15/2018, abdominal radiograph 07/11/2019 FINDINGS: Gallbladder: No gallstones or wall thickening visualized. No sonographic Murphy sign noted by sonographer. Common bile duct: Diameter: 3-4 mm, within normal limits Liver: No focal lesion identified. Within normal limits in parenchymal echogenicity. The interrogated portal vein is patent on color Doppler imaging with normal direction of blood flow towards the liver. IMPRESSION: Unremarkable right upper quadrant ultrasound as described. Electronically Signed   By: Kellie Simmering DO   On: 01/11/2020 22:38    Procedures Procedures (including critical care time)  Medications Ordered in ED Medications  sodium chloride flush (NS) 0.9 % injection 3 mL (has no administration in time range)  sucralfate (CARAFATE) tablet 1 g (has no administration in time range)    ED Course  I have reviewed the triage vital signs and the nursing notes.  Pertinent labs & imaging results that were available during my care of the patient were reviewed by me and considered in my medical decision making (see chart for details).  Clinical Course as of Jan 10 2301  Sat Jan 11, 2020  2302 CK is only mildly elevated.  It is unclear why she has mild myositis.  She is not on any statin.  Results discussed with the patient.  She will follow up with her PCP.  CK Total(!): 310 [AN]    Clinical Course User Index [AN] Varney Biles, MD   MDM Rules/Calculators/A&P                      44 year old comes in a chief complaint of abdominal pain and leg pain. Ultrasound DVT is negative. Delta troponin are negative.  She already has had a coronary CT  in the past and a CT PE which were both negative. Patient is having some epigastric abdominal pain that could be because of GERD, gastritis.  The other thing is that she is on pantoprazole and still having the symptoms.  There is also worsening of the pain with p.o. intake.  It is possible that the underlying symptoms could be because of cholelithiasis.  We will get ultrasound right upper quadrant.  I also added CK because of her insistence that the thigh is swollen and painful.  Final Clinical Impression(s) / ED Diagnoses Final diagnoses:  Abdominal pain  Pain of left lower extremity  Epigastric pain    Rx / DC Orders ED Discharge Orders         Ordered    sucralfate (CARAFATE) 1 g tablet  3 times daily with meals & bedtime     01/11/20 2300           Varney Biles, MD 01/11/20 2159    Varney Biles, MD 01/11/20 2302

## 2020-01-11 NOTE — ED Notes (Signed)
The pt is c/o burning in her chest for 3 days  And she has had some lt leg pain for a month or longer pulses present in both feet

## 2020-01-11 NOTE — Progress Notes (Addendum)
VASCULAR LAB PRELIMINARY  PRELIMINARY  PRELIMINARY  PRELIMINARY  Left lower extremity venous duplex completed.    Preliminary report:  See CV proc for preliminary results.  Gave report to Charmaine Downs, PA-C  Haring, RVT 01/11/2020, 7:34 PM

## 2020-01-13 ENCOUNTER — Telehealth: Payer: Self-pay | Admitting: Gastroenterology

## 2020-01-13 NOTE — Telephone Encounter (Signed)
Pt reported that she was seen at [facility] ER for burning sensation in chest.  She was prescribed Carafate.  Pt would like to discuss symptoms.

## 2020-01-13 NOTE — Telephone Encounter (Signed)
Patient reports reflux and epigastric pain.  S/P ED eval on 3/6.  Patient instructed to maintain an anti-reflux diet. Advised to avoid caffeine, mint, citrus foods/juices, tomatoes,  chocolate, NSAIDS/ASA products.  Instructed not to eat within 2 hours of exercise or bed, multiple small meals are better than 3 large meals.  Need to take PPI 30 minutes prior to 1st meal of the day.She will come in and see [name] RNP on 3/11 2:00

## 2020-01-16 ENCOUNTER — Encounter: Payer: Self-pay | Admitting: Nurse Practitioner

## 2020-01-16 ENCOUNTER — Ambulatory Visit: Payer: BC Managed Care – PPO | Admitting: Nurse Practitioner

## 2020-01-16 VITALS — BP 162/94 | HR 81 | Temp 97.9°F | Ht 64.0 in | Wt 186.5 lb

## 2020-01-16 DIAGNOSIS — R110 Nausea: Secondary | ICD-10-CM | POA: Diagnosis not present

## 2020-01-16 DIAGNOSIS — Z01818 Encounter for other preprocedural examination: Secondary | ICD-10-CM | POA: Diagnosis not present

## 2020-01-16 DIAGNOSIS — R0789 Other chest pain: Secondary | ICD-10-CM

## 2020-01-16 NOTE — Patient Instructions (Addendum)
If you are age 45 or older, your body mass index should be between 23-30. Your Body mass index is 32.01 kg/m. If this is out of the aforementioned range listed, please consider follow up with your Primary Care Provider.  If you are age 60 or younger, your body mass index should be between 19-25. Your Body mass index is 32.01 kg/m. If this is out of the aformentioned range listed, please consider follow up with your Primary Care Provider.   You have been scheduled for an endoscopy. Please follow written instructions given to you at your visit today. If you use inhalers (even only as needed), please bring them with you on the day of your procedure. Your physician has requested that you go to www.startemmi.com and enter the access code given to you at your visit today. This web site gives a general overview about your procedure. However, you should still follow specific instructions given to you by our office regarding your preparation for the procedure.  Thank you for choosing me and [facility].   [name], NP

## 2020-01-16 NOTE — Progress Notes (Signed)
IMPRESSION and PLAN:    # Belching, intermittent nausea, burning in throat, atypical chest pain.  -All of her symptoms are chronic -Several ED visits for atypical chest pain.  Extensive cardiac evaluation negative -Chest pain nor burning in throat have responded to acid blockers so wonder if this is even GERD related?  -Continue pantoprazole plus Pepcid.  We have previously recommended upper endoscopy.  I think she is ready to proceed.  The risks and benefits of EGD were discussed and the patient agrees to proceed.  -She wants an H. pylori test.  First, she would need to be off PPI/H2 blockers for several days prior to study and I do not know that she could tolerate that right now. Could obtain H.pylori IgG but would hold off for now since undergoing EGD and gastric biopsies may be obtained.    HPI:    Primary GI: Dr. Tarri Glenn  Chief complaint : Burning in throat, belching, nausea  **History comes from the chart and patient  Brooke Turner is a 44 year old female who established care with Dr. Tarri Glenn via telemedicine May 2020 for evaluation of intermittent chest pain, intermittent abdominal pain.  We felt her symptoms are most likely secondary to GERD and dyspepsia.  She was on an H2 blocker at the time but given breakthrough symptoms we added a PPI.  Patient was consented for EGD.  It appears we left her few voicemails to arrange for the procedure but were ultimately unable to get it scheduled. Patient office in October 2020 with several additional questions about the procedure.  After having those question answered patient scheduled the EGD for 09/18/19 but for unclear reasons it was not done. She called the office couple days ago with with complaints of reflux and epigastric pain.  She had been to the emergency department on 01/11/20.  Troponin was normal, CBC was normal.  CK total mildly elevated at 310.  Patient has been seen at the ED multiple times for chest pain.  She is followed by  cardiology and has had an extensive cardiac work-up.  Pain felt to be musculoskeletal exacerbated by anxiety related to pain. Patient has no evidence for any structural heart disease  Brooke Turner describes intermittent burning in her throat, intermittent belching.  She sometimes has a sensation of a warm draft moving up her throat.  Her symptoms are worse on an empty stomach.  Tums do not help.  When symptoms are bad she puts herself on a bland diet.  She is frequently nauseated.  Symptoms have been going on since at least last summer.  She was taking pepcid when we first met her in May and before that on Zantac so at least some of the symptoms have been present much longer.  She was given Carafate at  ED visit a few days ago.  She did not think it helped and did not pick up the prescription after a conversation with the pharmacist regarding the medication.  She gives a history of chronic abdominal pain present since 2019.  The pain is intermittent , occurs after eating certain foods but response is variable.  Episodes are crampy in nature and associated with sweating and bloating.   Review of systems:     No chest pain, no SOB, no fevers, no urinary sx   Past Medical History:  Diagnosis Date  . Anxiety   . Back pain   . Chest pain    a. 09/2014 Echo: EF 60-65%,  no rwma, nl valves;  b. 12/2014 Neg ETT.  . Essential hypertension    a. 01/2016 Renal duplex: no RAS- ? renal cyst (renal u/s 4/17 - no cyst, nl study); b. 02/2016 24 hr BP Monitor: Mean SBP ~ 138 with Max of 198. Highest pressures noted in evening hours following placement of cuff.  . Fibroid uterus   . GERD (gastroesophageal reflux disease)   . Palpitations    a. 09/2014 nl event monitor.    Patient's surgical history, family medical history, social history, medications and allergies were all reviewed in Epic   Serum creatinine: 1.16 mg/dL (H) 01/11/20 1818 Estimated creatinine clearance: 65.2 mL/min (A)  Current Outpatient Medications    Medication Sig Dispense Refill  . acetaminophen (TYLENOL) 500 MG tablet Take 500 mg by mouth every 6 (six) hours as needed (back pain/ cramps).     Marland Kitchen atenolol (TENORMIN) 25 MG tablet Take 6.25 mg by mouth 2 (two) times daily.  180 tablet 3  . blood glucose meter kit and supplies Test blood sugar twice daily. Dx code: R73.09 1 each 0  . busPIRone (BUSPAR) 10 MG tablet Take 10 mg by mouth daily.    . cholecalciferol (VITAMIN D3) 25 MCG (1000 UNIT) tablet Take 1,000 Units by mouth daily.    . Cyanocobalamin (VITAMIN B-12 PO) Take 1 tablet by mouth daily.    Marland Kitchen ELDERBERRY PO Take 1 capsule by mouth daily.    . famotidine (PEPCID) 20 MG tablet Take 1 tablet (20 mg total) by mouth 2 (two) times daily. 180 tablet 0  . fluticasone (FLONASE) 50 MCG/ACT nasal spray Place 2 sprays into both nostrils daily. (Patient taking differently: Place 2 sprays into both nostrils daily as needed (seasonal allergies). ) 16 g 0  . glucose blood (ONETOUCH VERIO) test strip TEST BLOOD SUGAR DAILY 25 each 10  . levalbuterol (XOPENEX HFA) 45 MCG/ACT inhaler Inhale 1-2 puffs every 6 hours if needed- rescue inhaler (Patient taking differently: Inhale 1-2 puffs into the lungs every 6 (six) hours as needed for wheezing. ) 1 Inhaler 12  . LORazepam (ATIVAN) 0.5 MG tablet Take 1 tablet (0.5 mg total) by mouth every 6 (six) hours as needed for anxiety. (Patient taking differently: Take 0.25 mg by mouth 3 (three) times daily as needed for anxiety. ) 15 tablet 0  . Multiple Vitamins-Minerals (MULTIVITAMIN GUMMIES ADULT PO) Take 2 tablets by mouth daily.     . Norethindrone-Ethinyl Estradiol-Fe Biphas (LO LOESTRIN FE) 1 MG-10 MCG / 10 MCG tablet Take 1 tablet by mouth at bedtime.     [name] DELICA LANCETS 48J MISC USE TO TEST BLOOD SUGAR DAILY 100 each 3  . pantoprazole (PROTONIX) 40 MG tablet TAKE 1 TABLET BY MOUTH ONCE DAILY BEFORE BREAKFAST (Patient taking differently: Take 40 mg by mouth daily. ) 90 tablet 0   Current  Facility-Administered Medications  Medication Dose Route Frequency Provider Last Rate Last Admin  . cyanocobalamin ((VITAMIN B-12)) injection 1,000 mcg  1,000 mcg Intramuscular Q30 days [name], MD   1,000 mcg at 10/19/18 8563    Physical Exam:     BP (!) 162/94 (BP Location: Left Arm, Patient Position: Sitting, Cuff Size: Normal)   Pulse 81   Temp 97.9 F (36.6 C) (Other (Comment)) Comment (Src): scan  Ht 5' 4"  (1.626 m)   Wt 186 lb 8 oz (84.6 kg)   BMI 32.01 kg/m   GENERAL:  Well developed female in NAD PSYCH: : Cooperative, normal affect CARDIAC:  RRR, no murmur heard, no peripheral edema PULM: Normal respiratory effort, lungs CTA bilaterally, no wheezing ABDOMEN:  Nondistended, soft, nontender. No obvious masses, no hepatomegaly,  normal bowel sounds SKIN:  turgor, no lesions seen Musculoskeletal:  Normal muscle tone, normal strength NEURO: Alert and oriented x 3, no focal neurologic deficits   [name] , NP 01/16/2020, 5:19 PM

## 2020-01-17 ENCOUNTER — Ambulatory Visit: Payer: BC Managed Care – PPO | Admitting: Physical Therapy

## 2020-01-17 ENCOUNTER — Telehealth: Payer: Self-pay | Admitting: Nurse Practitioner

## 2020-01-17 NOTE — Telephone Encounter (Signed)
Patient called to follow up on previous request.

## 2020-01-17 NOTE — Progress Notes (Signed)
Reviewed and agree with management plans. ? ?Cabela Pacifico L. Chayna Surratt, MD, MPH  ?

## 2020-01-20 ENCOUNTER — Ambulatory Visit: Payer: BC Managed Care – PPO | Admitting: Nurse Practitioner

## 2020-01-20 DIAGNOSIS — I10 Essential (primary) hypertension: Secondary | ICD-10-CM | POA: Diagnosis not present

## 2020-01-20 DIAGNOSIS — J029 Acute pharyngitis, unspecified: Secondary | ICD-10-CM | POA: Diagnosis not present

## 2020-01-20 DIAGNOSIS — R0789 Other chest pain: Secondary | ICD-10-CM | POA: Diagnosis not present

## 2020-01-20 DIAGNOSIS — R002 Palpitations: Secondary | ICD-10-CM | POA: Diagnosis not present

## 2020-01-20 DIAGNOSIS — R5383 Other fatigue: Secondary | ICD-10-CM | POA: Diagnosis not present

## 2020-01-20 NOTE — Telephone Encounter (Signed)
Pt called to f/u on the message below since she has not heard back from [place] yet. Pls call pt.

## 2020-01-21 ENCOUNTER — Encounter: Payer: BC Managed Care – PPO | Admitting: Gastroenterology

## 2020-01-21 NOTE — Telephone Encounter (Signed)
Beth, please see below and advise.

## 2020-01-21 NOTE — Telephone Encounter (Signed)
I have forwarded to [facility] to call her.

## 2020-01-22 NOTE — Telephone Encounter (Signed)
Okay, I am glad that she will proceed with the EGD.  Sure we can try something different.  She only been on pantoprazole, if so we can try Nexium 40 mg every morning.  Thanks

## 2020-01-22 NOTE — Telephone Encounter (Signed)
No answer. Left a message on her voicemail to return the call at her convenience.

## 2020-01-22 NOTE — Telephone Encounter (Signed)
Called the patient again. She wanted to know if it would be worth "at least trying a different medications than Pantoprazole." She is scheduled for the EGD on 01/30/20. She has seen her PCP who has taken blood and done throat cultures.  Thanks

## 2020-01-23 NOTE — Telephone Encounter (Signed)
Left a message for the patient with this information. Asked she call back if she is interested.

## 2020-01-28 ENCOUNTER — Other Ambulatory Visit: Payer: Self-pay | Admitting: Gastroenterology

## 2020-01-28 ENCOUNTER — Ambulatory Visit (INDEPENDENT_AMBULATORY_CARE_PROVIDER_SITE_OTHER): Payer: BC Managed Care – PPO

## 2020-01-28 DIAGNOSIS — Z1159 Encounter for screening for other viral diseases: Secondary | ICD-10-CM

## 2020-01-28 LAB — SARS CORONAVIRUS 2 (TAT 6-24 HRS): SARS Coronavirus 2: NEGATIVE

## 2020-01-29 ENCOUNTER — Telehealth: Payer: Self-pay | Admitting: Nurse Practitioner

## 2020-01-29 NOTE — Telephone Encounter (Signed)
Pt is scheduled for EGD tomorrow. She wants to know the reason why she needs to bring her inhaler. Pls call her.

## 2020-01-29 NOTE — Telephone Encounter (Signed)
Patient returned my call. Patient had questions about the procedure, and what our process is. I explained the process of our office, and reassured patient that bringing her inhaler is a precautionary measure for just in case she needs it. Pt verbalized understanding.

## 2020-01-29 NOTE — Telephone Encounter (Signed)
Tried to contact patient to answer questions about her procedure. Was unable to reach patient. Left voicemail to have patient call admitting back.

## 2020-01-30 ENCOUNTER — Other Ambulatory Visit: Payer: Self-pay

## 2020-01-30 ENCOUNTER — Other Ambulatory Visit: Payer: BC Managed Care – PPO

## 2020-01-30 ENCOUNTER — Encounter: Payer: Self-pay | Admitting: Gastroenterology

## 2020-01-30 ENCOUNTER — Ambulatory Visit (AMBULATORY_SURGERY_CENTER): Payer: BC Managed Care – PPO | Admitting: Gastroenterology

## 2020-01-30 VITALS — BP 128/68 | HR 63 | Temp 97.3°F | Resp 12 | Ht 64.0 in | Wt 186.0 lb

## 2020-01-30 DIAGNOSIS — K219 Gastro-esophageal reflux disease without esophagitis: Secondary | ICD-10-CM | POA: Diagnosis not present

## 2020-01-30 DIAGNOSIS — K208 Other esophagitis without bleeding: Secondary | ICD-10-CM | POA: Diagnosis not present

## 2020-01-30 DIAGNOSIS — K2950 Unspecified chronic gastritis without bleeding: Secondary | ICD-10-CM

## 2020-01-30 DIAGNOSIS — R110 Nausea: Secondary | ICD-10-CM

## 2020-01-30 DIAGNOSIS — K209 Esophagitis, unspecified without bleeding: Secondary | ICD-10-CM

## 2020-01-30 MED ORDER — PANTOPRAZOLE SODIUM 40 MG PO TBEC: 40.0000 mg | DELAYED_RELEASE_TABLET | Freq: Two times a day (BID) | ORAL | 0 refills | Status: DC

## 2020-01-30 MED ORDER — SODIUM CHLORIDE 0.9 % IV SOLN: 500.0000 mL | Freq: Once | INTRAVENOUS | Status: DC

## 2020-01-30 NOTE — Progress Notes (Signed)
Vitals-DT Temp-LC  Pt's states no medical or surgical changes since previsit or office visit.

## 2020-01-30 NOTE — Progress Notes (Signed)
Report to PACU, RN, vss, BBS= Clear.  

## 2020-01-30 NOTE — Op Note (Signed)
[facility] Patient Name: Brooke Turner Procedure Date: 01/30/2020 2:37 PM MRN: [id number] Endoscopist: Thornton Park MD, MD Age: 44 Referring MD:  Date of Birth: Jul 22, 1976 Gender: Female Account #: [id number] Procedure:                Upper GI endoscopy Indications:              Chest pain (non cardiac), Eructation, Nausea Medicines:                Monitored Anesthesia Care Procedure:                Pre-Anesthesia Assessment:                           - Prior to the procedure, a History and Physical                            was performed, and patient medications and                            allergies were reviewed. The patient's tolerance of                            previous anesthesia was also reviewed. The risks                            and benefits of the procedure and the sedation                            options and risks were discussed with the patient.                            All questions were answered, and informed consent                            was obtained. Prior Anticoagulants: The patient has                            taken no previous anticoagulant or antiplatelet                            agents. ASA Grade Assessment: II - A patient with                            mild systemic disease. After reviewing the risks                            and benefits, the patient was deemed in                            satisfactory condition to undergo the procedure.                           After obtaining informed consent, the endoscope was  passed under direct vision. Throughout the                            procedure, the patient's blood pressure, pulse, and                            oxygen saturations were monitored continuously. The                            Endoscope was introduced through the mouth, and                            advanced to the third part of duodenum. The upper   GI endoscopy was accomplished without difficulty.                            The patient tolerated the procedure well. Scope In: Scope Out: Findings:                 LA Grade B (one or more mucosal breaks greater than                            5 mm, not extending between the tops of two mucosal                            folds) esophagitis with no bleeding was found.                            Biopsies were taken from the distal esopahgus as                            well as from the mid and proximal esophagus with a                            cold forceps for histology. Estimated blood loss                            was minimal.                           The entire examined stomach was normal. Biopsies                            were taken from the antrum, body, and fundus with a                            cold forceps for histology. Estimated blood loss                            was minimal.                           The examined duodenum was normal.  The cardia and gastric fundus were normal on                            retroflexion.                           The exam was otherwise without abnormality. Complications:            No immediate complications. Estimated blood loss:                            Minimal. Estimated Blood Loss:     Estimated blood loss was minimal. Impression:               - LA Grade C reflux esophagitis with no bleeding.                            Biopsied.                           - Normal stomach. Biopsied.                           - Normal examined duodenum.                           - The examination was otherwise normal. Recommendation:           - Patient has a contact number available for                            emergencies. The signs and symptoms of potential                            delayed complications were discussed with the                            patient. Return to normal activities tomorrow.                             Written discharge instructions were provided to the                            patient.                           - Resume previous diet.                           - Continue present medications. Increase                            pantoprazole to 40 mg BID x 8 weeks. Continue                            famotidine 20 mg BID.                           -  No aspirin, ibuprofen, naproxen, or other                            non-steroidal anti-inflammatory drugs.                           - Await pathology results.                           - Follow-up with Dr. Tarri Glenn or NP Chester Holstein in 6                            weeks. Thornton Park MD, MD 01/30/2020 3:08:33 PM This report has been signed electronically.

## 2020-01-30 NOTE — Patient Instructions (Signed)
Thank you for allowing [place] to care for you today!  Await pathology results, approximately 1-2 weeks. Increase Pantoprazole to 40 mg by mouth twice daily for 8 weeks.  Continue with the Famotidine 20 mg twice daily.  No Aspirin, Ibuprofen, or Naproxyn or other Non-steroidal anti-inflammatory medications..  Follow up with Dr Tarri Glenn or [name], NP in 6 weeks.  We will call to set this appointment up with you.  Return to your normal activities tomorrow.     YOU HAD AN ENDOSCOPIC PROCEDURE TODAY AT [facility]:   Refer to the procedure report that was given to you for any specific questions about what was found during the examination.  If the procedure report does not answer your questions, please call your gastroenterologist to clarify.  If you requested that your care partner not be given the details of your procedure findings, then the procedure report has been included in a sealed envelope for you to review at your convenience later.  YOU SHOULD EXPECT: Some feelings of bloating in the abdomen. Passage of more gas than usual.  Walking can help get rid of the air that was put into your GI tract during the procedure and reduce the bloating. If you had a lower endoscopy (such as a colonoscopy or flexible sigmoidoscopy) you may notice spotting of blood in your stool or on the toilet paper. If you underwent a bowel prep for your procedure, you may not have a normal bowel movement for a few days.  Please Note:  You might notice some irritation and congestion in your nose or some drainage.  This is from the oxygen used during your procedure.  There is no need for concern and it should clear up in a day or so.  SYMPTOMS TO REPORT IMMEDIATELY:    Following upper endoscopy (EGD)  Vomiting of blood or coffee ground material  New chest pain or pain under the shoulder blades  Painful or persistently difficult swallowing  New shortness of breath  Fever of 100F or  higher  Black, tarry-looking stools  For urgent or emergent issues, a gastroenterologist can be reached at any hour by calling [phone]. Do not use MyChart messaging for urgent concerns.    DIET:  We do recommend a small meal at first, but then you may proceed to your regular diet.  Drink plenty of fluids but you should avoid alcoholic beverages for 24 hours.  ACTIVITY:  You should plan to take it easy for the rest of today and you should NOT DRIVE or use heavy machinery until tomorrow (because of the sedation medicines used during the test).    FOLLOW UP: Our staff will call the number listed on your records 48-72 hours following your procedure to check on you and address any questions or concerns that you may have regarding the information given to you following your procedure. If we do not reach you, we will leave a message.  We will attempt to reach you two times.  During this call, we will ask if you have developed any symptoms of COVID 19. If you develop any symptoms (ie: fever, flu-like symptoms, shortness of breath, cough etc.) before then, please call [phone].  If you test positive for Covid 19 in the 2 weeks post procedure, please call and report this information to [place].    If any biopsies were taken you will be contacted by phone or by letter within the next 1-3 weeks.  Please call us at [phone]  if you have not heard about the biopsies in 3 weeks.    SIGNATURES/CONFIDENTIALITY: You and/or your care partner have signed paperwork which will be entered into your electronic medical record.  These signatures attest to the fact that that the information above on your After Visit Summary has been reviewed and is understood.  Full responsibility of the confidentiality of this discharge information lies with you and/or your care-partner.

## 2020-01-30 NOTE — Progress Notes (Signed)
Called to room to assist during endoscopic procedure.  Patient ID and intended procedure confirmed with present staff. Received instructions for my participation in the procedure from the performing physician.  

## 2020-01-31 ENCOUNTER — Other Ambulatory Visit: Payer: Self-pay

## 2020-01-31 ENCOUNTER — Emergency Department (HOSPITAL_COMMUNITY): Payer: BC Managed Care – PPO

## 2020-01-31 ENCOUNTER — Emergency Department (HOSPITAL_COMMUNITY)
Admission: EM | Admit: 2020-01-31 | Discharge: 2020-01-31 | Disposition: A | Discharge status: Home / Self Care | Payer: BC Managed Care – PPO | Attending: Emergency Medicine | Admitting: Emergency Medicine

## 2020-01-31 ENCOUNTER — Encounter: Payer: Self-pay | Admitting: *Deleted

## 2020-01-31 ENCOUNTER — Telehealth: Payer: Self-pay | Admitting: Gastroenterology

## 2020-01-31 ENCOUNTER — Encounter (HOSPITAL_COMMUNITY): Payer: Self-pay

## 2020-01-31 DIAGNOSIS — R0602 Shortness of breath: Secondary | ICD-10-CM | POA: Diagnosis not present

## 2020-01-31 DIAGNOSIS — K2100 Gastro-esophageal reflux disease with esophagitis, without bleeding: Secondary | ICD-10-CM

## 2020-01-31 DIAGNOSIS — Z87891 Personal history of nicotine dependence: Secondary | ICD-10-CM | POA: Diagnosis not present

## 2020-01-31 DIAGNOSIS — Z9889 Other specified postprocedural states: Secondary | ICD-10-CM

## 2020-01-31 DIAGNOSIS — I10 Essential (primary) hypertension: Secondary | ICD-10-CM | POA: Insufficient documentation

## 2020-01-31 DIAGNOSIS — R1010 Upper abdominal pain, unspecified: Secondary | ICD-10-CM | POA: Diagnosis not present

## 2020-01-31 DIAGNOSIS — R1013 Epigastric pain: Secondary | ICD-10-CM

## 2020-01-31 DIAGNOSIS — R079 Chest pain, unspecified: Secondary | ICD-10-CM | POA: Diagnosis not present

## 2020-01-31 DIAGNOSIS — R0789 Other chest pain: Secondary | ICD-10-CM | POA: Diagnosis not present

## 2020-01-31 DIAGNOSIS — Z79899 Other long term (current) drug therapy: Secondary | ICD-10-CM | POA: Diagnosis not present

## 2020-01-31 DIAGNOSIS — D259 Leiomyoma of uterus, unspecified: Secondary | ICD-10-CM | POA: Diagnosis not present

## 2020-01-31 DIAGNOSIS — Z8719 Personal history of other diseases of the digestive system: Secondary | ICD-10-CM | POA: Diagnosis not present

## 2020-01-31 DIAGNOSIS — R109 Unspecified abdominal pain: Secondary | ICD-10-CM | POA: Diagnosis not present

## 2020-01-31 LAB — COMPREHENSIVE METABOLIC PANEL
ALT: 16 U/L (ref 0–44)
AST: 20 U/L (ref 15–41)
Albumin: 3.7 g/dL (ref 3.5–5.0)
Alkaline Phosphatase: 43 U/L (ref 38–126)
Anion gap: 8 (ref 5–15)
BUN: 13 mg/dL (ref 6–20)
CO2: 24 mmol/L (ref 22–32)
Calcium: 8.4 mg/dL — ABNORMAL LOW (ref 8.9–10.3)
Chloride: 108 mmol/L (ref 98–111)
Creatinine, Ser: 1.06 mg/dL — ABNORMAL HIGH (ref 0.44–1.00)
GFR calc Af Amer: >60 mL/min (ref >60)
GFR calc non Af Amer: >60 mL/min (ref >60)
Glucose, Bld: 101 mg/dL — ABNORMAL HIGH (ref 70–99)
Potassium: 3.7 mmol/L (ref 3.5–5.1)
Sodium: 140 mmol/L (ref 135–145)
Total Bilirubin: 1.3 mg/dL — ABNORMAL HIGH (ref 0.3–1.2)
Total Protein: 6.7 g/dL (ref 6.5–8.1)

## 2020-01-31 LAB — CBC WITH DIFFERENTIAL/PLATELET
Abs Immature Granulocytes: 0.01 10*3/uL (ref 0.00–0.07)
Basophils Absolute: 0 10*3/uL (ref 0.0–0.1)
Basophils Relative: 1 %
Eosinophils Absolute: 0 10*3/uL (ref 0.0–0.5)
Eosinophils Relative: 0 %
HCT: 36.5 % (ref 36.0–46.0)
Hemoglobin: 12.3 g/dL (ref 12.0–15.0)
Immature Granulocytes: 0 %
Lymphocytes Relative: 54 %
Lymphs Abs: 2.7 10*3/uL (ref 0.7–4.0)
MCH: 30.3 pg (ref 26.0–34.0)
MCHC: 33.7 g/dL (ref 30.0–36.0)
MCV: 89.9 fL (ref 80.0–100.0)
Monocytes Absolute: 0.5 10*3/uL (ref 0.1–1.0)
Monocytes Relative: 9 %
Neutro Abs: 1.8 10*3/uL (ref 1.7–7.7)
Neutrophils Relative %: 36 %
Platelets: 219 10*3/uL (ref 150–400)
RBC: 4.06 MIL/uL (ref 3.87–5.11)
RDW: 12.4 % (ref 11.5–15.5)
WBC: 5 10*3/uL (ref 4.0–10.5)
nRBC: 0 % (ref 0.0–0.2)

## 2020-01-31 LAB — LIPASE, BLOOD: Lipase: 25 U/L (ref 11–51)

## 2020-01-31 MED ORDER — SUCRALFATE 1 GM/10ML PO SUSP: 1.0000 g | Freq: Three times a day (TID) | ORAL | 0 refills | Status: DC

## 2020-01-31 MED ORDER — SUCRALFATE 1 GM/10ML PO SUSP
1.0000 g | Freq: Once | ORAL | Status: AC
  Administered 2020-01-31: 1 g via ORAL
  Filled 2020-01-31: qty 10

## 2020-01-31 MED ORDER — SODIUM CHLORIDE (PF) 0.9 % IJ SOLN
INTRAMUSCULAR | Status: AC
  Filled 2020-01-31: qty 50

## 2020-01-31 MED ORDER — IOHEXOL 300 MG/ML  SOLN
100.0000 mL | Freq: Once | INTRAMUSCULAR | Status: AC | PRN
  Administered 2020-01-31: 100 mL via INTRAVENOUS

## 2020-01-31 MED ORDER — ALUM & MAG HYDROXIDE-SIMETH 200-200-20 MG/5ML PO SUSP
30.0000 mL | Freq: Once | ORAL | Status: AC
  Administered 2020-01-31: 30 mL via ORAL
  Filled 2020-01-31: qty 30

## 2020-01-31 MED ORDER — SUCRALFATE 1 G PO TABS: 1.0000 g | ORAL_TABLET | Freq: Three times a day (TID) | ORAL | 0 refills | Status: DC

## 2020-01-31 MED ORDER — HYOSCYAMINE SULFATE 0.125 MG SL SUBL: 0.1250 mg | SUBLINGUAL_TABLET | Freq: Four times a day (QID) | SUBLINGUAL | 0 refills | Status: DC | PRN

## 2020-01-31 MED ORDER — FENTANYL CITRATE (PF) 100 MCG/2ML IJ SOLN
50.0000 ug | Freq: Once | INTRAMUSCULAR | Status: AC
  Administered 2020-01-31: 50 ug via INTRAVENOUS
  Filled 2020-01-31: qty 2

## 2020-01-31 MED ORDER — HYOSCYAMINE SULFATE 0.125 MG PO TBDP
0.1250 mg | ORAL_TABLET | Freq: Once | ORAL | Status: AC
  Administered 2020-01-31: 0.125 mg via ORAL
  Filled 2020-01-31 (×2): qty 1

## 2020-01-31 NOTE — ED Notes (Signed)
GI at bedside

## 2020-01-31 NOTE — Consult Note (Addendum)
Referring Provider: Dr. Madalyn Rob Primary Care Physician:  [name], MD Primary Gastroenterologist:  Dr. Tarri Glenn   Reason for Consultation:  chest and abdominal pain post EGD   HPI: Brooke Turner is a 44 y.o. female with a past medical history of hypertension, anxiety, uterine fibroids and GERD. Past wisdom teeth extraction, no other surgical history. She underwent an EGD by Dr. Tarri Glenn on 01/30/2020 due to having worsening reflux symptoms including atypical chest pain, nausea and upper abdominal pain. The EGD showed grade C esophagitis, biopsies were obtained. Her stomach was normal, biopsies were obtained. She awakened from the EGD feeling a little light headed and dizzy which resolved prior to discharge home. She also noted feeling pain to the mid esophagus between the breasts shortly after awakening from the EGD. No severe pain. No nausea or vomiting. She went home and she tried to eat eggs which resulted in severe sharp pain to her mid esophagus and it felt like the food just sat at the top of her stomach. She described having excruciating epigastric pain. Drinking water also caused a sharp burning pain down the esophagus. She went to bed and slept without difficulty. She awakened today with recurrence of chest and epigastric pain that was triggered by drinking only water. She stated she's never felt this pain before. The chest pain is somewhat radiating to her upper back. She underwent an abdominal sonogram 01/11/2020 which showed a normal gallbladder. Mother had gallbladder surgery. No cough or SOB. No palpitation. No NSAID use. No alcohol or drug use. She is taking Protonix 48m bid. No BM or gas per the rectum x 24 hours. She passed soft green stools x 3 last week. No rectal bleeding or melena. Father with history of liver cancer.    Laboratory studies 01/31/2020: Sodium 140.  Potassium 3.7.  Glucose 101.  BUN 13.  Creatinine 1.06.  Calcium 8.4.  Alk phos 43.  Albumin 3.7.   Lipase 25.  AST 20.  ALT 16.  Total bili 1.3.  WBC 5.0.  Hemoglobin 12.3.  Hematocrit 36.5.  Platelet 219.  Right upper moderate abdominal ultrasound 01/11/2020: Gallbladder:No gallstones or wall thickening visualized. No sonographic Murphy sign noted by sonographer. Common bile duct: Diameter: 3-4 mm, within normal limits Liver:No focal lesion identified. Within normal limits in parenchymal echogenicity. The interrogated portal vein is patent on color Doppler imaging with normal direction of blood flow towards the liver.  EGD 01/30/2020: - LA Grade C reflux esophagitis with no bleeding. Biopsied. - Normal stomach. Biopsied. - Normal examined duodenum. - The examination was otherwise normal.    Past Medical History:  Diagnosis Date  . Anxiety   . Back pain   . Chest pain    a. 09/2014 Echo: EF 60-65%, no rwma, nl valves;  b. 12/2014 Neg ETT.  . Essential hypertension    a. 01/2016 Renal duplex: no RAS- ? renal cyst (renal u/s 4/17 - no cyst, nl study); b. 02/2016 24 hr BP Monitor: Mean SBP ~ 138 with Max of 198. Highest pressures noted in evening hours following placement of cuff.  . Fibroid uterus   . GERD (gastroesophageal reflux disease)   . Palpitations    a. 09/2014 nl event monitor.    Past Surgical History:  Procedure Laterality Date  . WISDOM TOOTH EXTRACTION      Prior to Admission medications   Medication Sig Start Date End Date Taking? Authorizing Provider  acetaminophen (TYLENOL) 500 MG tablet Take 500 mg by mouth  every 6 (six) hours as needed (back pain/ cramps).    Yes [provider]  atenolol (TENORMIN) 25 MG tablet Take 6.25 mg by mouth 2 (two) times daily.  04/25/19  Yes Alvstad, Kristin L, RPH-CPP  cholecalciferol (VITAMIN D3) 25 MCG (1000 UNIT) tablet Take 1,000 Units by mouth daily.   Yes [provider]  ELDERBERRY PO Take 1 capsule by mouth daily.   Yes [provider]  famotidine (PEPCID) 20 MG tablet Take 1 tablet (20 mg  total) by mouth 2 (two) times daily. 11/20/18  Yes [name], MD  fluticasone [facility]) 50 MCG/ACT nasal spray Place 2 sprays into both nostrils daily. Patient taking differently: Place 2 sprays into both nostrils daily as needed (seasonal allergies).  10/10/17  Yes Tenna Delaine D, PA-C  levalbuterol [facility] HFA) 45 MCG/ACT inhaler Inhale 1-2 puffs every 6 hours if needed- rescue inhaler Patient taking differently: Inhale 1-2 puffs into the lungs every 6 (six) hours as needed for wheezing.  05/29/19  Yes Young, Kasandra Knudsen, MD  LORazepam (ATIVAN) 0.5 MG tablet Take 1 tablet (0.5 mg total) by mouth every 6 (six) hours as needed for anxiety. Patient taking differently: Take 0.25 mg by mouth 3 (three) times daily as needed for anxiety.  03/19/19  Yes Forrest Moron, MD  Multiple Vitamins-Minerals (MULTIVITAMIN GUMMIES ADULT PO) Take 2 tablets by mouth daily.    Yes [provider]  Norethindrone-Ethinyl Estradiol-Fe Biphas (LO LOESTRIN FE) 1 MG-10 MCG / 10 MCG tablet Take 1 tablet by mouth at bedtime.    Yes [provider]  pantoprazole (PROTONIX) 40 MG tablet Take 1 tablet (40 mg total) by mouth 2 (two) times daily. 01/30/20 03/30/20 Yes Thornton Park, MD  vitamin B-12 (CYANOCOBALAMIN) 1000 MCG tablet Take 1,000 mcg by mouth daily.    Yes [provider]  blood glucose meter kit and supplies Test blood sugar twice daily. Dx code: R73.09 04/25/19   Maryruth Hancock, MD  busPIRone (BUSPAR) 10 MG tablet Take 10 mg by mouth daily.    [provider]  glucose blood (ONETOUCH VERIO) test strip TEST BLOOD SUGAR DAILY 09/02/19   Forrest Moron, MD  [facility] DELICA LANCETS 66Y MISC USE TO TEST BLOOD SUGAR DAILY 11/16/18   [name], MD    Current Facility-Administered Medications  Medication Dose Route Frequency Provider Last Rate Last Admin  . cyanocobalamin ((VITAMIN B-12)) injection 1,000 mcg  1,000 mcg Intramuscular Q30 days [name], MD   1,000 mcg at  10/19/18 4034   Current Outpatient Medications  Medication Sig Dispense Refill  . acetaminophen (TYLENOL) 500 MG tablet Take 500 mg by mouth every 6 (six) hours as needed (back pain/ cramps).     Marland Kitchen atenolol (TENORMIN) 25 MG tablet Take 6.25 mg by mouth 2 (two) times daily.  180 tablet 3  . cholecalciferol (VITAMIN D3) 25 MCG (1000 UNIT) tablet Take 1,000 Units by mouth daily.    Marland Kitchen ELDERBERRY PO Take 1 capsule by mouth daily.    . famotidine (PEPCID) 20 MG tablet Take 1 tablet (20 mg total) by mouth 2 (two) times daily. 180 tablet 0  . fluticasone (FLONASE) 50 MCG/ACT nasal spray Place 2 sprays into both nostrils daily. (Patient taking differently: Place 2 sprays into both nostrils daily as needed (seasonal allergies). ) 16 g 0  . levalbuterol (XOPENEX HFA) 45 MCG/ACT inhaler Inhale 1-2 puffs every 6 hours if needed- rescue inhaler (Patient taking differently: Inhale 1-2 puffs into  the lungs every 6 (six) hours as needed for wheezing. ) 1 Inhaler 12  . LORazepam (ATIVAN) 0.5 MG tablet Take 1 tablet (0.5 mg total) by mouth every 6 (six) hours as needed for anxiety. (Patient taking differently: Take 0.25 mg by mouth 3 (three) times daily as needed for anxiety. ) 15 tablet 0  . Multiple Vitamins-Minerals (MULTIVITAMIN GUMMIES ADULT PO) Take 2 tablets by mouth daily.     . Norethindrone-Ethinyl Estradiol-Fe Biphas (LO LOESTRIN FE) 1 MG-10 MCG / 10 MCG tablet Take 1 tablet by mouth at bedtime.     . pantoprazole (PROTONIX) 40 MG tablet Take 1 tablet (40 mg total) by mouth 2 (two) times daily. 120 tablet 0  . vitamin B-12 (CYANOCOBALAMIN) 1000 MCG tablet Take 1,000 mcg by mouth daily.     . blood glucose meter kit and supplies Test blood sugar twice daily. Dx code: R73.09 1 each 0  . busPIRone (BUSPAR) 10 MG tablet Take 10 mg by mouth daily.    Marland Kitchen glucose blood (ONETOUCH VERIO) test strip TEST BLOOD SUGAR DAILY 25 each 10  . ONETOUCH DELICA LANCETS 09K MISC USE TO TEST BLOOD SUGAR DAILY 100 each 3     Allergies as of 01/31/2020 - Review Complete 01/31/2020  Allergen Reaction Noted  . Penicillins Hives 01/04/2012  . Tamiflu [oseltamivir phosphate] Nausea And Vomiting 11/18/2013  . Tinidazole Other (See Comments) 09/14/2015  . Diltiazem Other (See Comments) 04/12/2016    Family History  Problem Relation Age of Onset  . Seizures Mother   . Epilepsy Mother   . Cancer Father        Liver  . Diabetes Father 48  . Hypertension Father   . Heart disease Father 36       CEA, LE St[place]  . Alzheimer's disease Maternal Grandmother   . Heart disease Maternal Grandfather   . Colon cancer Neg Hx   . Breast cancer Neg Hx     Social History   Socioeconomic History  . Marital status: Single    Spouse name: N/A  . Number of children: 0  . Years of education: 73  . Highest education level: Not on file  Occupational History  . Occupation: Research scientist (physical sciences): [employer]  Tobacco Use  . Smoking status: Former Smoker    Packs/day: 0.25    Years: 10.00    Pack years: 2.50    Types: Cigarettes    Start date: 06/25/2000    Quit date: 08/19/2014    Years since quitting: 5.4  . Smokeless tobacco: Never Used  Substance and Sexual Activity  . Alcohol use: No    Alcohol/week: 0.0 standard drinks  . Drug use: No  . Sexual activity: Not Currently    Partners: Male    Birth control/protection: Pill  Other Topics Concern  . Not on file  Social History Narrative   Patient lives at home alone .   Patient works full time at [employer]   Education college.   Right handed.   Caffeine none   Social Determinants of Health   Financial Resource Strain:   . Difficulty of Paying Living Expenses:   Food Insecurity:   . Worried About Charity fundraiser in the Last Year:   . Arboriculturist in the Last Year:   Transportation Needs:   . Film/video editor (Medical):   Marland Kitchen Lack of Transportation (Non-Medical):   Physical Activity:   . Days of Exercise per Week:   .  Minutes of Exercise per Session:   Stress:   . Feeling of Stress :   Social Connections:   . Frequency of Communication with Friends and Family:   . Frequency of Social Gatherings with Friends and Family:   . Attends Religious Services:   . Active Member of Clubs or Organizations:   . Attends Archivist Meetings:   Marland Kitchen Marital Status:   Intimate Partner Violence:   . Fear of Current or Ex-Partner:   . Emotionally Abused:   Marland Kitchen Physically Abused:   . Sexually Abused:     Review of Systems: See HPI, all other systems reviewed and are negative    Physical Exam: Vital signs in last 24 hours: Temp:  [98.4 F (36.9 C)] 98.4 F (36.9 C) (03/26 1236) Pulse Rate:  [40-86] 40 (03/26 1400) Resp:  [0-27] 25 (03/26 1400) BP: (112-154)/(62-109) 134/72 (03/26 1400) SpO2:  [98 %-100 %] 98 % (03/26 1400) Weight:  [72.6 kg] 72.6 kg (03/26 1236)  She takes BCPs, denies chance of pregnancy.  General:  Alert,  well-developed, well-nourished, pleasant and cooperative in NAD. Head:  Normocephalic and atraumatic. Eyes:  No scleral icterus. Conjunctiva pink. Ears:  Normal auditory acuity. Nose:  No deformity, discharge or lesions. Mouth: Dentition intact. No ulcers or lesions.  Neck:  Supple. No lymphadenopathy or thyromegaly.  Lungs: Sounds clear throughout. Chest: No crepitus. Heart: Regular rate and rhythm, no murmurs. Abdomen: Moderate epigastric tenderness without rebound or guarding.  Hypoactive bowel sounds to all 4 quadrants.  No HSM. Rectal:  Deferred. Msk:  Symmetrical without gross deformities.  Pulses:  Normal pulses noted. Extremities:  Without clubbing or edema. Neurologic:  Alert and  oriented x4. No focal deficits.  Skin:  Intact without significant lesions or rashes. Psych:  Alert and cooperative. Normal mood and affect.  Intake/Output from previous day: No intake/output data recorded. Intake/Output this shift: No intake/output data recorded.  Lab Results: Recent  Labs    01/31/20 1315  WBC 5.0  HGB 12.3  HCT 36.5  PLT 219   BMET Recent Labs    01/31/20 1315  NA 140  K 3.7  CL 108  CO2 24  GLUCOSE 101*  BUN 13  CREATININE 1.06*  CALCIUM 8.4*   LFT Recent Labs    01/31/20 1315  PROT 6.7  ALBUMIN 3.7  AST 20  ALT 16  ALKPHOS 43  BILITOT 1.3*   PT/INR No results for input(s): LABPROT, INR in the last 72 hours. Hepatitis Panel No results for input(s): HEPBSAG, HCVAB, HEPAIGM, HEPBIGM in the last 72 hours.    Studies/Results: No results found.  IMPRESSION/PLAN:  81. 44 year old female with a history of GERD and atypical chest pain status post EGD 01/30/2020 which showed esophagitis grade C presents to the ED with mid chest pain and severe epigastric pain.  WBC 5.0.  Hemoglobin 12.3.  AST 20.  ALT 16.  Total bilirubin slightly elevated at 1.3.  Alk phos 43.  Patient declined GI cocktail.  Chest x-ray and abdominal/pelvic CT without oral contrast ordered but not yet completed. -Further recommendations to be determined after chest x-ray and CT abdomen pelvis results reviewed -If chest x-ray and CT negative, consider CCK HIDA scan as outpatient -Await EGD biopsy results  -Recommend Hyoscyamine 0.188m SL Q 6 to 8 hrs PRN for abdominal pain -Carafate slurry 1gm po tid x 10 days -Continue Pantoprazole 455mpo bid -Follow up with Dr. BeTarri Glennr PaTye SavoyP in 2 weeks   CoPatrecia Pour  Kennedy-Smith  01/31/2020, 2:44 PM   GI ATTENDING  History, laboratories, x-rays, previous endoscopy report reviewed personally.  Case discussed with GI nurse practitioner who evaluated the patient in the emergency room.  Impression: 1.  Complaints of chest pain and epigastric pain unrelated to procedure--  i.e., not a procedural complication. 2.  GERD with esophagitis.  On therapy 3.  Complaints of chronic atypical chest pain (longstanding).  Not clearly GI  Recommendations: 1.  Complete thorough ER evaluation regarding her chest/epigastric  pain.  CT scan pending.  If no significant GI abnormalities on CT, then okay to go home from GI standpoint assuming no other significant abnormalities that are non-GI (imaging or otherwise). 2.  Continue PPI and H2 receptor antagonist therapy as outlined by Dr. Tarri Glenn yesterday in her endoscopy report 3.  Outpatient follow-up with Dr. Tarri Glenn as recommended yesterday in her endoscopy report Thank you.  Docia Chuck. Geri Seminole., M.D. [facility] Division of Gastroenterology

## 2020-01-31 NOTE — Telephone Encounter (Signed)
Patient called and complain of severe pain to her lower esophageal area. Unable to eat and swallow liquids without pain. Patient states it really hurts this is different from what shes experience before. The pain is also in her back area. Wanted to speak to you about this problem she is having. Spoke with Doctor on called and stated to go to emergency room to be evaluated. Left message on answering machine. Will attempt to call back.

## 2020-01-31 NOTE — Telephone Encounter (Signed)
Returned phone call and spoke with patient. Instructed to go to ER to be evaluated per Dr. Bryan Lemma. No swelling or fever per patient. But experiencing severe pain below esophageal area especially while drinking and eating.

## 2020-01-31 NOTE — ED Provider Notes (Signed)
[facility] DEPT Provider Note   CSN: [id number] Arrival date & time: 01/31/20  1214     History Chief Complaint  Patient presents with  . post endoscopy issues    Brooke Turner is a 44 y.o. female.  Presents to ER with complaint of abdominal pain, lower chest pain.  States she noted some discomfort last night, worse today.  Pain primarily occurs whenever she tries eating.  States it feels like something is irritating her esophagus while she is trying to swallow and eat things.  Currently pain is mild but more severe eating.  Is able to keep foods down.  No vomiting.  Not having any nausea at present.  EGD yesterday, found to have esophagitis, biopsy taken, no other abnormalities.  HPI     Past Medical History:  Diagnosis Date  . Anxiety   . Back pain   . Chest pain    a. 09/2014 Echo: EF 60-65%, no rwma, nl valves;  b. 12/2014 Neg ETT.  . Essential hypertension    a. 01/2016 Renal duplex: no RAS- ? renal cyst (renal u/s 4/17 - no cyst, nl study); b. 02/2016 24 hr BP Monitor: Mean SBP ~ 138 with Max of 198. Highest pressures noted in evening hours following placement of cuff.  . Fibroid uterus   . GERD (gastroesophageal reflux disease)   . Palpitations    a. 09/2014 nl event monitor.    Patient Active Problem List   Diagnosis Date Noted  . Costochondral junction syndrome 10/02/2019  . OSA (obstructive sleep apnea) 06/11/2019  . Encounter for examination following treatment at hospital 04/25/2019  . Hypokalemia 04/25/2019  . Elevated glucose 04/25/2019  . History of posttraumatic stress disorder (PTSD) 12/31/2018  . History of panic attacks 12/31/2018  . Palpitations   . Chest pain of uncertain etiology   . Chronic tension-type headache, not intractable 06/10/2015  . Dyspnea 03/18/2015  . Post-traumatic stress reaction 02/25/2015  . Anxiety disorder due to general medical condition with panic attack 02/25/2015  . Subserous  leiomyoma of uterus 02/25/2015  . Anxiety about health 02/25/2015  . Deviated nasal septum 12/30/2014  . Chronic rhinitis 12/30/2014  . Chronic maxillary sinusitis 12/30/2014  . Snorings 09/30/2014  . Prediabetes 08/26/2014  . Acanthosis nigricans 03/19/2014  . Vitamin D deficiency 07/15/2013  . Large breasts 09/12/2012  . Essential hypertension 02/17/2012  . Obesity (BMI 30-39.9) previous BMI was over 40. Patient has lost 70 pounds in the last year 02/17/2012  . Anxiety disorder 02/17/2012  . Chronic back pain 02/17/2012    Past Surgical History:  Procedure Laterality Date  . WISDOM TOOTH EXTRACTION       OB History    Gravida  2   Para  0   Term  0   Preterm  0   AB  2   Living  0     SAB  1   TAB  1   Ectopic  0   Multiple  0   Live Births              Family History  Problem Relation Age of Onset  . Seizures Mother   . Epilepsy Mother   . Cancer Father        Liver  . Diabetes Father 63  . Hypertension Father   . Heart disease Father 61       CEA, LE [place]  . Alzheimer's disease Maternal Grandmother   . Heart disease Maternal Grandfather   .  Colon cancer Neg Hx   . Breast cancer Neg Hx     Social History   Tobacco Use  . Smoking status: Former Smoker    Packs/day: 0.25    Years: 10.00    Pack years: 2.50    Types: Cigarettes    Start date: 06/25/2000    Quit date: 08/19/2014    Years since quitting: 5.4  . Smokeless tobacco: Never Used  Substance Use Topics  . Alcohol use: No    Alcohol/week: 0.0 standard drinks  . Drug use: No    Home Medications Prior to Admission medications   Medication Sig Start Date End Date Taking? Authorizing Provider  acetaminophen (TYLENOL) 500 MG tablet Take 500 mg by mouth every 6 (six) hours as needed (back pain/ cramps).    Yes [provider]  atenolol (TENORMIN) 25 MG tablet Take 6.25 mg by mouth 2 (two) times daily.  04/25/19  Yes Alvstad, Kristin L, RPH-CPP  cholecalciferol  (VITAMIN D3) 25 MCG (1000 UNIT) tablet Take 1,000 Units by mouth daily.   Yes [provider]  ELDERBERRY PO Take 1 capsule by mouth daily.   Yes [provider]  famotidine (PEPCID) 20 MG tablet Take 1 tablet (20 mg total) by mouth 2 (two) times daily. 11/20/18  Yes [name], MD  fluticasone [facility]) 50 MCG/ACT nasal spray Place 2 sprays into both nostrils daily. Patient taking differently: Place 2 sprays into both nostrils daily as needed (seasonal allergies).  10/10/17  Yes Tenna Delaine D, PA-C  levalbuterol [facility] HFA) 45 MCG/ACT inhaler Inhale 1-2 puffs every 6 hours if needed- rescue inhaler Patient taking differently: Inhale 1-2 puffs into the lungs every 6 (six) hours as needed for wheezing.  05/29/19  Yes Young, Kasandra Knudsen, MD  LORazepam (ATIVAN) 0.5 MG tablet Take 1 tablet (0.5 mg total) by mouth every 6 (six) hours as needed for anxiety. Patient taking differently: Take 0.25 mg by mouth 3 (three) times daily as needed for anxiety.  03/19/19  Yes Forrest Moron, MD  Multiple Vitamins-Minerals (MULTIVITAMIN GUMMIES ADULT PO) Take 2 tablets by mouth daily.    Yes [provider]  Norethindrone-Ethinyl Estradiol-Fe Biphas (LO LOESTRIN FE) 1 MG-10 MCG / 10 MCG tablet Take 1 tablet by mouth at bedtime.    Yes [provider]  pantoprazole (PROTONIX) 40 MG tablet Take 1 tablet (40 mg total) by mouth 2 (two) times daily. 01/30/20 03/30/20 Yes Thornton Park, MD  vitamin B-12 (CYANOCOBALAMIN) 1000 MCG tablet Take 1,000 mcg by mouth daily.    Yes [provider]  blood glucose meter kit and supplies Test blood sugar twice daily. Dx code: R73.09 04/25/19   Maryruth Hancock, MD  busPIRone (BUSPAR) 10 MG tablet Take 10 mg by mouth daily.    [provider]  glucose blood (ONETOUCH VERIO) test strip TEST BLOOD SUGAR DAILY 09/02/19   Forrest Moron, MD  [facility] DELICA LANCETS 91T MISC USE TO TEST BLOOD SUGAR DAILY 11/16/18   [name], MD     Allergies    Penicillins, Tamiflu [oseltamivir phosphate], Tinidazole, and Diltiazem  Review of Systems   Review of Systems  Constitutional: Negative for chills and fever.  HENT: Negative for ear pain and sore throat.   Eyes: Negative for pain and visual disturbance.  Respiratory: Negative for cough and shortness of breath.   Cardiovascular: Positive for chest pain. Negative for palpitations.  Gastrointestinal: Positive for abdominal pain. Negative for vomiting.  Genitourinary: Negative for  dysuria and hematuria.  Musculoskeletal: Negative for arthralgias and back pain.  Skin: Negative for color change and rash.  Neurological: Negative for seizures and syncope.  All other systems reviewed and are negative.   Physical Exam Updated Vital Signs BP 133/71 (BP Location: Left Arm)   Pulse 69   Temp 98.4 F (36.9 C) (Oral)   Resp 14   Ht _0  (1.626 m)   Wt 72.6 kg   SpO2 100%   BMI 27.46 kg/m   Physical Exam Vitals and nursing note reviewed.  Constitutional:      General: She is not in acute distress.    Appearance: She is well-developed.  HENT:     Head: Normocephalic and atraumatic.  Eyes:     Conjunctiva/sclera: Conjunctivae normal.  Cardiovascular:     Rate and Rhythm: Normal rate and regular rhythm.     Heart sounds: No murmur.  Pulmonary:     Effort: Pulmonary effort is normal. No respiratory distress.     Breath sounds: Normal breath sounds.  Abdominal:     Palpations: Abdomen is soft.     Tenderness: There is no guarding or rebound.     Comments: Mild epigastric tenderness, abdomen otherwise soft  Musculoskeletal:     Cervical back: Neck supple.  Skin:    General: Skin is warm and dry.  Neurological:     Mental Status: She is alert.     ED Results / Procedures / Treatments   Labs (all labs ordered are listed, but only abnormal results are displayed) Labs Reviewed  COMPREHENSIVE METABOLIC PANEL - Abnormal; Notable for the following components:       Result Value   Glucose, Bld 101 (*)    Creatinine, Ser 1.06 (*)    Calcium 8.4 (*)    Total Bilirubin 1.3 (*)    All other components within normal limits  CBC WITH DIFFERENTIAL/PLATELET  LIPASE, BLOOD    EKG None  Radiology DG Chest 2 View  Result Date: 01/31/2020 CLINICAL DATA:  Shortness of breath. EXAM: CHEST - 2 VIEW COMPARISON:  January 11, 2020 FINDINGS: The heart size and mediastinal contours are within normal limits. Both lungs are clear. The visualized skeletal structures are unremarkable. IMPRESSION: No active cardiopulmonary disease. Electronically Signed   By: Constance Holster M.D.   On: 01/31/2020 15:34    Procedures Procedures (including critical care time)  Medications Ordered in ED Medications  sodium chloride (PF) 0.9 % injection (has no administration in time range)  fentaNYL (SUBLIMAZE) injection 50 mcg (50 mcg Intravenous Given 01/31/20 1335)  alum & mag hydroxide-simeth (MAALOX/MYLANTA) 200-200-20 MG/5ML suspension 30 mL (30 mLs Oral Given 01/31/20 1503)  iohexol (OMNIPAQUE) 300 MG/ML solution 100 mL (100 mLs Intravenous Contrast Given 01/31/20 1622)  hyoscyamine (ANASPAZ) disintergrating tablet 0.125 mg (0.125 mg Oral Given 01/31/20 1715)  sucralfate (CARAFATE) 1 GM/10ML suspension 1 g (1 g Oral Given 01/31/20 1715)    ED Course  I have reviewed the triage vital signs and the nursing notes.  Pertinent labs & imaging results that were available during my care of the patient were reviewed by me and considered in my medical decision making (see chart for details).    MDM Rules/Calculators/A&P                      44 year old lady presenting to ER with epigastric pain, pain with swallowing after recent EGD.  Gastroenterology consulted, evaluated patient, recommended CXR, CT abdomen pelvis to rule out perforation.  If CT negative, discharge home with hyoscyamine and Carafate regimen  While awaiting CT results, patient signed out to Dr. Vallery Ridge, refer her note  for final plan and disposition.  Anticipate likely dc home.    Final Clinical Impression(s) / ED Diagnoses Final diagnoses:  Epigastric pain  History of endoscopy    Rx / DC Orders ED Discharge Orders    None       Lucrezia Starch, MD 01/31/20 1721

## 2020-01-31 NOTE — Discharge Instructions (Addendum)
Gastroenterology recommended starting Hyoscyamine as needed for pain and starting carafate for 10 days.  Continue your protonix. Follow up with Dr. Tarri Glenn or [name] NP in 2 weeks.  If your pain worsens, you develop vomiting, or other new concerning symptom, recommend return to ER for reassessment.

## 2020-01-31 NOTE — ED Triage Notes (Signed)
Patient states she had an endoscopy with biopsies yesterday. Patient states today she is having pain from the mid chest to the upper abdomen. Pain is worse with swallowing, eating, and drinking.

## 2020-01-31 NOTE — Telephone Encounter (Signed)
Pt had an EGD 01/30/20 and reported that she is experiencing a sore but sharp pain in her lower esophagus especially when she eats or drinks.  She stated that the pain radiates to her back and worsens when she drinks warm liquids.

## 2020-01-31 NOTE — ED Provider Notes (Signed)
Seen by GI. If CT negative, Ok for d/c. Physical Exam  BP 133/71 (BP Location: Left Arm)   Pulse 69   Temp 98.4 F (36.9 C) (Oral)   Resp 14   Ht 5\' 4"  (1.626 m)   Wt 72.6 kg   SpO2 100%   BMI 27.46 kg/m   Physical Exam  ED Course/Procedures     Procedures  MDM         Charlesetta Shanks, MD 01/31/20 1706

## 2020-01-31 NOTE — Telephone Encounter (Signed)
Recent endoscopy report by Dr. Tarri Glenn reviewed this morning.  Biopsies obtained, but no esophageal dilation performed.  Office notes reviewed as well.  Per notes, one of the indications for the procedure was chest pain, but patient states these current symptoms are new.  Low suspicion for procedure related etiology, but given location and severity of symptoms reported by patient, recommended going to the ER for expedited evaluation with imaging as appropriate.

## 2020-02-03 ENCOUNTER — Telehealth: Payer: Self-pay | Admitting: *Deleted

## 2020-02-03 NOTE — Telephone Encounter (Signed)
Left message on f/u call 

## 2020-02-03 NOTE — Telephone Encounter (Signed)
First follow up call attempt.  Left message on vm. 

## 2020-02-05 ENCOUNTER — Ambulatory Visit: Payer: BC Managed Care – PPO | Attending: Internal Medicine

## 2020-02-05 ENCOUNTER — Telehealth: Payer: Self-pay | Admitting: Gastroenterology

## 2020-02-05 ENCOUNTER — Encounter: Payer: Self-pay | Admitting: Gastroenterology

## 2020-02-05 DIAGNOSIS — Z20822 Contact with and (suspected) exposure to covid-19: Secondary | ICD-10-CM

## 2020-02-05 NOTE — Telephone Encounter (Signed)
Patient is calling to follow up on issues she has encountered since her procedure and she is highly concerned

## 2020-02-05 NOTE — Telephone Encounter (Signed)
Pt scheduled to see [name] PA 02/10/20@2 :30pm. Pt aware of appt.

## 2020-02-05 NOTE — Telephone Encounter (Signed)
Pt called and is quite upset. States she had terrible pain after her EGD. States she could not eat or drink for several days following her EGD. Pt was seen in the ER and had a CT scan. Pt states she was given carafate and something for pain. She states the hyoscyamine she was given in the ER did not help for her pain. Asked pt if she was taking the protonix BID and pepcid BID and she states she was taking them daily, she felt these only treated the symptoms not the cause of the pain. Reviewed path letter with pt and that she should be taking the meds BID to treat the gastritis and inflammation and the GERD. Pt wants to know why she has gerd, what caused it. Discussed different causes and that the meds would treat the reflux also. Pt has multiple questions and complaints regarding her procedure and care. Would like a call back.

## 2020-02-05 NOTE — Telephone Encounter (Signed)
To fully address her questions, I recommend a follow-up appointment. I would be happy to address in a virtual visit at the end of my procedure day today if she would like. Thank you.

## 2020-02-06 LAB — NOVEL CORONAVIRUS, NAA: SARS-CoV-2, NAA: NOT DETECTED

## 2020-02-10 ENCOUNTER — Encounter: Payer: Self-pay | Admitting: Physician Assistant

## 2020-02-10 ENCOUNTER — Ambulatory Visit (INDEPENDENT_AMBULATORY_CARE_PROVIDER_SITE_OTHER): Payer: BC Managed Care – PPO | Admitting: Physician Assistant

## 2020-02-10 VITALS — BP 140/88 | HR 72 | Temp 98.7°F | Ht 64.0 in | Wt 185.0 lb

## 2020-02-10 DIAGNOSIS — R1013 Epigastric pain: Secondary | ICD-10-CM | POA: Diagnosis not present

## 2020-02-10 DIAGNOSIS — K219 Gastro-esophageal reflux disease without esophagitis: Secondary | ICD-10-CM

## 2020-02-10 MED ORDER — PANTOPRAZOLE SODIUM 40 MG PO TBEC: 40.0000 mg | DELAYED_RELEASE_TABLET | Freq: Two times a day (BID) | ORAL | 1 refills | Status: DC

## 2020-02-10 NOTE — Progress Notes (Signed)
 Chief Complaint: Follow-up epigastric pain  HPI:    Mrs. Brooke Turner is a 44-year-old female with a past medical history as listed below including reflux and others, known to Dr. Beavers, who returns to clinic today for follow-up of epigastric pain.    01/16/2020 patient seen in clinic by Paula Guenther, NP for belching, intermittent nausea and burning in her throat with atypical chest pain.  We discussed that all of her symptoms were chronic with several ED visits for atypical chest pain extensive cardiac evaluation which was negative.  Apparently chest pain or burning in throat had responded to acid blockers.  Told to continue pantoprazole plus Pepcid and have an EGD.  Patient also wanted an H. pylori test.  It was discussed that she would need to be off of PPI/H2 blockers for several days prior to the study and she did not think she could tolerate that at the time.    01/30/2020 EGD with LA grade C reflux esophagitis with no bleeding.  Otherwise normal.  Is recommended patient increase her pantoprazole to 40 mg twice daily x8 weeks and continue Famotidine 20 mg twice daily.  Biopsies showed chronic inactive gastritis as well as GERD.    01/31/2020 patient called our clinic and complained of severe pain to her lower esophagus and unable to eat and swallow liquids without pain.  She was sent to the ED.    01/31/2020 ER visit for abdominal pain and lower chest pain and difficulty swallowing.  CMP, CBC and lipase normal.  Chest x-ray normal.  She was consulted by our service while in the ER.  She had declined GI cocktail.  She had a CT which was negative showing no acute abnormality.  It was discussed that she should have a CCK HIDA scan as an outpatient.  Is recommended she have hyoscyamine every 6- 8 hours.    Today, the patient presents to clinic and explains that she is feeling better, her pain is a lot better than it was initially with her Carafate which she is using 1-2 times a day because "it so  hard to take around my other medicines".  She is currently only using her Pantoprazole 40 mg daily and her Pepcid 20 mg twice daily.  Tells me that she read she was supposed to increase it but never got any prescription and did not want to run out.  Overall she is feeling better but does have a lot of questions in regards to what made her esophagus like it was and what to do in the future.  Does tell me the pain she was experiencing after her EGD was nothing like the pain she was being evaluated for.  Tells me she literally could not eat anything because it hurts so bad for the next 2 to 3 days "right where they did the biopsies".    Denies fever, chills, blood in her stool, change in bowel habits or symptoms that awaken her from sleep.  Past Medical History:  Diagnosis Date  . Anxiety   . Back pain   . Chest pain    a. 09/2014 Echo: EF 60-65%, no rwma, nl valves;  b. 12/2014 Neg ETT.  . Essential hypertension    a. 01/2016 Renal duplex: no RAS- ? renal cyst (renal u/s 4/17 - no cyst, nl study); b. 02/2016 24 hr BP Monitor: Mean SBP ~ 138 with Max of 198. Highest pressures noted in evening hours following placement of cuff.  . Fibroid uterus   .   GERD (gastroesophageal reflux disease)   . Palpitations    a. 09/2014 nl event monitor.    Past Surgical History:  Procedure Laterality Date  . WISDOM TOOTH EXTRACTION      Current Outpatient Medications  Medication Sig Dispense Refill  . acetaminophen (TYLENOL) 500 MG tablet Take 500 mg by mouth every 6 (six) hours as needed (back pain/ cramps).     Marland Kitchen atenolol (TENORMIN) 25 MG tablet Take 6.25 mg by mouth 2 (two) times daily.  180 tablet 3  . blood glucose meter kit and supplies Test blood sugar twice daily. Dx code: R73.09 1 each 0  . busPIRone (BUSPAR) 10 MG tablet Take 10 mg by mouth daily.    . cholecalciferol (VITAMIN D3) 25 MCG (1000 UNIT) tablet Take 1,000 Units by mouth daily.    Marland Kitchen ELDERBERRY PO Take 1 capsule by mouth daily.    .  famotidine (PEPCID) 20 MG tablet Take 1 tablet (20 mg total) by mouth 2 (two) times daily. 180 tablet 0  . fluticasone (FLONASE) 50 MCG/ACT nasal spray Place 2 sprays into both nostrils daily. (Patient taking differently: Place 2 sprays into both nostrils daily as needed (seasonal allergies). ) 16 g 0  . glucose blood (ONETOUCH VERIO) test strip TEST BLOOD SUGAR DAILY 25 each 10  . hyoscyamine (LEVSIN SL) 0.125 MG SL tablet Place 1 tablet (0.125 mg total) under the tongue every 6 (six) hours as needed for cramping (abd pain). 30 tablet 0  . levalbuterol (XOPENEX HFA) 45 MCG/ACT inhaler Inhale 1-2 puffs every 6 hours if needed- rescue inhaler (Patient taking differently: Inhale 1-2 puffs into the lungs every 6 (six) hours as needed for wheezing. ) 1 Inhaler 12  . LORazepam (ATIVAN) 0.5 MG tablet Take 1 tablet (0.5 mg total) by mouth every 6 (six) hours as needed for anxiety. (Patient taking differently: Take 0.25 mg by mouth 3 (three) times daily as needed for anxiety. ) 15 tablet 0  . Multiple Vitamins-Minerals (MULTIVITAMIN GUMMIES ADULT PO) Take 2 tablets by mouth daily.     . Norethindrone-Ethinyl Estradiol-Fe Biphas (LO LOESTRIN FE) 1 MG-10 MCG / 10 MCG tablet Take 1 tablet by mouth at bedtime.     [name] DELICA LANCETS 45Y MISC USE TO TEST BLOOD SUGAR DAILY 100 each 3  . pantoprazole (PROTONIX) 40 MG tablet Take 1 tablet (40 mg total) by mouth 2 (two) times daily. 120 tablet 0  . sucralfate (CARAFATE) 1 GM/10ML suspension Take 10 mLs (1 g total) by mouth 4 (four) times daily -  with meals and at bedtime. 420 mL 0  . vitamin B-12 (CYANOCOBALAMIN) 1000 MCG tablet Take 1,000 mcg by mouth daily.      Current Facility-Administered Medications  Medication Dose Route Frequency Provider Last Rate Last Admin  . cyanocobalamin ((VITAMIN B-12)) injection 1,000 mcg  1,000 mcg Intramuscular Q30 days [name], MD   1,000 mcg at 10/19/18 1837    Allergies as of 02/10/2020 - Review Complete 01/31/2020   Allergen Reaction Noted  . Penicillins Hives 01/04/2012  . Tamiflu [oseltamivir phosphate] Nausea And Vomiting 11/18/2013  . Tinidazole Other (See Comments) 09/14/2015  . Diltiazem Other (See Comments) 04/12/2016    Family History  Problem Relation Age of Onset  . Seizures Mother   . Epilepsy Mother   . Cancer Father        Liver  . Diabetes Father 16  . Hypertension Father   . Heart disease Father 90  CEA, LE [place]  . Alzheimer's disease Maternal Grandmother   . Heart disease Maternal Grandfather   . Colon cancer Neg Hx   . Breast cancer Neg Hx     Social History   Socioeconomic History  . Marital status: Single    Spouse name: N/A  . Number of children: 0  . Years of education: 16  . Highest education level: Not on file  Occupational History  . Occupation: Customer Service    Employer: [employer]  Tobacco Use  . Smoking status: Former Smoker    Packs/day: 0.25    Years: 10.00    Pack years: 2.50    Types: Cigarettes    Start date: 06/25/2000    Quit date: 08/19/2014    Years since quitting: 5.4  . Smokeless tobacco: Never Used  Substance and Sexual Activity  . Alcohol use: No    Alcohol/week: 0.0 standard drinks  . Drug use: No  . Sexual activity: Not Currently    Partners: Male    Birth control/protection: Pill  Other Topics Concern  . Not on file  Social History Narrative   Patient lives at home alone .   Patient works full time at [employer].   Education college.   Right handed.   Caffeine none   Social Determinants of Health   Financial Resource Strain:   . Difficulty of Paying Living Expenses:   Food Insecurity:   . Worried About Running Out of Food in the Last Year:   . Ran Out of Food in the Last Year:   Transportation Needs:   . Lack of Transportation (Medical):   . Lack of Transportation (Non-Medical):   Physical Activity:   . Days of Exercise per Week:   . Minutes of Exercise per Session:   Stress:   . Feeling of  Stress :   Social Connections:   . Frequency of Communication with Friends and Family:   . Frequency of Social Gatherings with Friends and Family:   . Attends Religious Services:   . Active Member of Clubs or Organizations:   . Attends Club or Organization Meetings:   . Marital Status:   Intimate Partner Violence:   . Fear of Current or Ex-Partner:   . Emotionally Abused:   . Physically Abused:   . Sexually Abused:     Review of Systems:    Constitutional: No weight loss, fever or chills Cardiovascular: No chest pain Respiratory: No SOB Gastrointestinal: See HPI and otherwise negative   Physical Exam:  Vital signs: BP 140/88   Pulse 72   Temp 98.7 F (37.1 C)   Ht 5' 4" (1.626 m)   Wt 185 lb (83.9 kg)   BMI 31.76 kg/m   Constitutional:   Pleasant female appears to be in NAD, Well developed, Well nourished, alert and cooperative Respiratory: Respirations even and unlabored. Lungs clear to auscultation bilaterally.   No wheezes, crackles, or rhonchi.  Cardiovascular: Normal S1, S2. No MRG. Regular rate and rhythm. No peripheral edema, cyanosis or pallor.  Gastrointestinal:  Soft, nondistended, mild RLQ ttp. No rebound or guarding. Normal bowel sounds. No appreciable masses or hepatomegaly. Psychiatric:  Demonstrates good judgement and reason without abnormal affect or behaviors.  RELEVANT LABS AND IMAGING: CBC    Component Value Date/Time   WBC 5.0 01/31/2020 1315   RBC 4.06 01/31/2020 1315   HGB 12.3 01/31/2020 1315   HGB 14.0 07/02/2018 1838   HCT 36.5 01/31/2020 1315   HCT 42.8 07/02/2018 1838     PLT 219 01/31/2020 1315   PLT 291 07/02/2018 1838   MCV 89.9 01/31/2020 1315   MCV 90.5 10/01/2018 1743   MCV 92 07/02/2018 1838   MCH 30.3 01/31/2020 1315   MCHC 33.7 01/31/2020 1315   RDW 12.4 01/31/2020 1315   RDW 13.1 07/02/2018 1838   LYMPHSABS 2.7 01/31/2020 1315   LYMPHSABS 3.4 (H) 10/25/2016 1842   MONOABS 0.5 01/31/2020 1315   EOSABS 0.0 01/31/2020 1315    EOSABS 0.0 10/25/2016 1842   BASOSABS 0.0 01/31/2020 1315   BASOSABS 0.0 10/25/2016 1842    CMP     Component Value Date/Time   NA 140 01/31/2020 1315   NA 140 04/24/2019 1651   K 3.7 01/31/2020 1315   CL 108 01/31/2020 1315   CO2 24 01/31/2020 1315   GLUCOSE 101 (H) 01/31/2020 1315   BUN 13 01/31/2020 1315   BUN 12 04/24/2019 1651   CREATININE 1.06 (H) 01/31/2020 1315   CREATININE 1.10 06/08/2016 1802   CALCIUM 8.4 (L) 01/31/2020 1315   PROT 6.7 01/31/2020 1315   PROT 7.4 10/01/2018 1729   ALBUMIN 3.7 01/31/2020 1315   ALBUMIN 4.5 10/01/2018 1729   AST 20 01/31/2020 1315   ALT 16 01/31/2020 1315   ALKPHOS 43 01/31/2020 1315   BILITOT 1.3 (H) 01/31/2020 1315   BILITOT 1.0 10/01/2018 1729   GFRNONAA >60 01/31/2020 1315   GFRAA >60 01/31/2020 1315    Assessment: 1.  GERD: Recent EGD with grade C esophagitis, likely cause of patient's atypical chest pain and symptoms 2.  Epigastric pain: Better now with Carafate and pantoprazole, recent EGD with gastritis  Plan: 1.  Patient symptoms are improving.  Did recommend that she go ahead and increase her Pantoprazole to 40 mg twice daily as recommended by Dr. Tarri Glenn after time of EGD to help heal her esophagus.  Sent in her in a new prescription #60 with 1 refill.  Explained that after finishing 2 months of this she can decrease back down to once daily. 2.  Would recommend the patient to finish her Carafate 3.  Discussed that if she continues with symptoms after she finishes Carafate then we could try a change to Omeprazole 40 twice a day. 4.  Recommend she continue her Pepcid 20 mg twice daily. 5.  Discussed pathophysiology of esophagitis with the patient including etiology.  It could be that her Pantoprazole just stopped working over time.  There are no specific triggers that she mentions in her history. 6.  Reviewed an antireflux diet and lifestyle modifications. 7.  Patient to follow in clinic with [place] as needed in the future.   [name], PA-C [facility] 02/10/2020, 2:37 PM  Cc: [name], MD

## 2020-02-10 NOTE — Progress Notes (Signed)
Reviewed and agree with management plans. ? ?Genesis Novosad L. Pepe Mineau, MD, MPH  ?

## 2020-02-10 NOTE — Patient Instructions (Signed)
If you are age 45 or older, your body mass index should be between 23-30. Your Body mass index is 31.76 kg/m. If this is out of the aforementioned range listed, please consider follow up with your Primary Care Provider.  If you are age 4 or younger, your body mass index should be between 19-25. Your Body mass index is 31.76 kg/m. If this is out of the aformentioned range listed, please consider follow up with your Primary Care Provider.   Increase Pantoprazole 40 mg twice daily for 2 months then decease back to once daily.  Continue Pepcid. Finish Carafate.

## 2020-02-17 ENCOUNTER — Ambulatory Visit: Payer: Self-pay

## 2020-02-18 ENCOUNTER — Ambulatory Visit
Admission: RE | Admit: 2020-02-18 | Discharge: 2020-02-18 | Disposition: A | Discharge status: Home / Self Care | Payer: BC Managed Care – PPO | Source: Ambulatory Visit | Attending: Neurology | Admitting: Neurology

## 2020-02-18 ENCOUNTER — Other Ambulatory Visit: Payer: Self-pay

## 2020-02-18 DIAGNOSIS — E559 Vitamin D deficiency, unspecified: Secondary | ICD-10-CM | POA: Diagnosis not present

## 2020-02-18 DIAGNOSIS — J301 Allergic rhinitis due to pollen: Secondary | ICD-10-CM | POA: Diagnosis not present

## 2020-02-18 DIAGNOSIS — R5383 Other fatigue: Secondary | ICD-10-CM | POA: Diagnosis not present

## 2020-02-18 DIAGNOSIS — J029 Acute pharyngitis, unspecified: Secondary | ICD-10-CM | POA: Diagnosis not present

## 2020-02-18 DIAGNOSIS — R42 Dizziness and giddiness: Secondary | ICD-10-CM

## 2020-02-18 MED ORDER — GADOBENATE DIMEGLUMINE 529 MG/ML IV SOLN
18.0000 mL | Freq: Once | INTRAVENOUS | Status: AC | PRN
  Administered 2020-02-18: 18 mL via INTRAVENOUS

## 2020-02-19 ENCOUNTER — Telehealth: Payer: Self-pay

## 2020-02-19 DIAGNOSIS — I10 Essential (primary) hypertension: Secondary | ICD-10-CM | POA: Diagnosis not present

## 2020-02-19 DIAGNOSIS — K633 Ulcer of intestine: Secondary | ICD-10-CM | POA: Diagnosis not present

## 2020-02-19 DIAGNOSIS — R519 Headache, unspecified: Secondary | ICD-10-CM | POA: Diagnosis not present

## 2020-02-19 DIAGNOSIS — T7840XA Allergy, unspecified, initial encounter: Secondary | ICD-10-CM | POA: Diagnosis not present

## 2020-02-19 NOTE — Telephone Encounter (Signed)
LMOVM. Please call back to go over her MRI results.

## 2020-02-20 ENCOUNTER — Telehealth: Payer: Self-pay | Admitting: Cardiology

## 2020-02-20 ENCOUNTER — Ambulatory Visit: Payer: BC Managed Care – PPO | Attending: Internal Medicine

## 2020-02-20 ENCOUNTER — Ambulatory Visit: Payer: Self-pay

## 2020-02-20 DIAGNOSIS — Z23 Encounter for immunization: Secondary | ICD-10-CM

## 2020-02-20 NOTE — Telephone Encounter (Signed)
New Message    Pt is calling and says her PCP suggested her to take a daily nitro tablet to take daily for her chest pain. She says she wants to try it to see if it will help with the chest pain that she has every day      Please advise

## 2020-02-20 NOTE — Progress Notes (Signed)
   Covid-19 Vaccination Clinic  Name:  Brooke Turner    MRN: [id number] DOB: [birth year]  02/20/2020  [name] was observed post Covid-19 immunization for 30 minutes based on pre-vaccination screening without incident. She was provided with Vaccine Information Sheet and instruction to access the V-Safe system.   [name] was instructed to call 911 with any severe reactions post vaccine: Marland Kitchen Difficulty breathing  . Swelling of face and throat  . A fast heartbeat  . A bad rash all over body  . Dizziness and weakness   Immunizations Administered    Name Date Dose VIS Date Route   Pfizer COVID-19 Vaccine 02/20/2020  2:37 PM 0.3 mL 10/18/2019 Intramuscular   Manufacturer: [place]   Lot: [id number]   [place]: [id number]

## 2020-02-20 NOTE — Telephone Encounter (Signed)
Spoke with pt who report she has had several work up for CP throughout the year that doesn't explain her symptoms. Pt state since December, she continued to have pain but it's different from last year. She report before symptoms were a dull pain and know it's more of a sharp pain that comes and going more frequently throughout the week. She denies symptoms at the moment.  Pt state PCP recommend that she follow up with cardiologist again maybe take Imdur daily. Appointment scheduled for 4/20 with Brooke Deforest, PA for further evaluations. Nurse advised to report to ER if symptoms worsen. Pt verbalized understanding.

## 2020-02-20 NOTE — Telephone Encounter (Signed)
Left message to call back  

## 2020-02-20 NOTE — Telephone Encounter (Signed)
Patient returning Alisha's call.  

## 2020-02-21 ENCOUNTER — Telehealth: Payer: Self-pay | Admitting: Neurology

## 2020-02-21 DIAGNOSIS — R0789 Other chest pain: Secondary | ICD-10-CM | POA: Diagnosis not present

## 2020-02-21 DIAGNOSIS — I10 Essential (primary) hypertension: Secondary | ICD-10-CM | POA: Diagnosis not present

## 2020-02-21 DIAGNOSIS — F418 Other specified anxiety disorders: Secondary | ICD-10-CM | POA: Diagnosis not present

## 2020-02-21 DIAGNOSIS — R002 Palpitations: Secondary | ICD-10-CM | POA: Diagnosis not present

## 2020-02-21 NOTE — Telephone Encounter (Signed)
Patient called and left a message requesting a call back about her MRI results.

## 2020-02-21 NOTE — Telephone Encounter (Signed)
Left detail message with results, ok per DPR, and to call back if any questions.

## 2020-02-24 ENCOUNTER — Telehealth: Payer: Self-pay | Admitting: Neurology

## 2020-02-24 NOTE — Telephone Encounter (Signed)
The following message was left with [employer] on 02/21/20 at 6:40 PM.  Caller states someone called her but had a bad connection. Do not leave a detailed message about results.

## 2020-02-24 NOTE — Telephone Encounter (Signed)
The patient can discuss it with Dr. Delice Lesch when she returns next week.

## 2020-02-24 NOTE — Telephone Encounter (Signed)
Sometimes the brain may show tiny spots of unknown clinical significance.  It does not look like anything pathological or specific for anything concerning.  There is some mild inflammatory changes but no obvious acute sinusitis.

## 2020-02-24 NOTE — Telephone Encounter (Signed)
Dr. Tomi Likens,   I returned pt's call because pt received the vm that Tee left with her MRI result. Pt had some concerns. She states she is really concerned about the part about tiny spots being shown. She states she does not have a hx of headaches/migranes or BP issues. She states its only been this past month when she has noticed she had some headaches, but they are mild and usually go away within 10 mins without medication. She states she was told the MRI could show if she had a sinus infection and wanted to know if this showed one at all.

## 2020-02-24 NOTE — Telephone Encounter (Signed)
Attempted to reach patient. No answer. Vm left to call back  

## 2020-02-24 NOTE — Telephone Encounter (Signed)
Dr. Tomi Likens,  I spoke with pt and informed her of your message. Pt is still concerned about the spots. She states she wants to know if these spots will resolve on their own or is this something that needs to be closely monitored or if these spots could lead to something worse like an aneurysm or some other type of problem

## 2020-02-25 ENCOUNTER — Ambulatory Visit (INDEPENDENT_AMBULATORY_CARE_PROVIDER_SITE_OTHER): Payer: BC Managed Care – PPO | Admitting: Physician Assistant

## 2020-02-25 ENCOUNTER — Encounter: Payer: Self-pay | Admitting: Physician Assistant

## 2020-02-25 ENCOUNTER — Other Ambulatory Visit: Payer: Self-pay

## 2020-02-25 VITALS — BP 136/78 | HR 73 | Temp 97.3°F | Ht 64.0 in | Wt 189.6 lb

## 2020-02-25 DIAGNOSIS — M79606 Pain in leg, unspecified: Secondary | ICD-10-CM | POA: Diagnosis not present

## 2020-02-25 DIAGNOSIS — I2510 Atherosclerotic heart disease of native coronary artery without angina pectoris: Secondary | ICD-10-CM

## 2020-02-25 DIAGNOSIS — R0789 Other chest pain: Secondary | ICD-10-CM

## 2020-02-25 DIAGNOSIS — I10 Essential (primary) hypertension: Secondary | ICD-10-CM | POA: Diagnosis not present

## 2020-02-25 NOTE — Progress Notes (Signed)
Cardiology Office Note:    Date:  02/28/2020   ID:  Brooke Turner, DOB [birth year], MRN [id number]  PCP:  [name], MD  Cardiologist:  Minus Breeding, MD / Dr. Edd Arbour at [facility] Electrophysiologist:  Virl Axe, MD   Referring MD: [name], MD   No chief complaint on file.   History of Present Illness:    Brooke Turner is a 44 y.o. female with a hx of PTSD, panic disorder, OSA on CPAP, chronic chest pain, frequent palpitation, hypertension, and GERD.  CTA of the chest obtained on 03/09/2019 showed unchanged 3 mm pulmonary nodule in the right upper and middle lobe, no evidence of PE or intrathoracic process.  Echocardiogram obtained on 04/10/2019 showed EF 60 to 65%, no significant valve issue.  Coronary CT obtained on 06/18/2019 showed a coronary calcium score of 0, normal coronary arteries with right dominance, otherwise no evidence of CAD in the main vessels, could not comment on PDA or PL which were poorly visualized due to motion artifact.  She was last seen by Bunnie Domino, NP on 08/22/2019 for atypical chest pain.  CT abdomen and pelvis with contrast obtained in March 2021 showed no acute abnormality, normal caliber abdominal aorta.  Since the last office visit, patient has had further evaluation by [facility] cardiology service.  Zio patch monitor obtained between February and March 2021 was very reassuring, 54 triggers and 33 diary entries with underlying sinus rhythm.  Renal artery duplex was normal as well.  Patient presents today for cardiology evaluation of her chest discomfort.  She says the chest discomfort is on the left side of the chest and she describes as a sharp stabbing pain that occurs for a few seconds and go away.  She was seen by Dr. Edd Arbour of [facility] for this who felt that this may be microvascular angina.  Imdur 30 mg was prescribed for her which she did not start at this point.  I recommended she start to try the Imdur as  initial trial.  Her symptom however is quite atypical and the may not be cardiac in nature.  She also complained of a left upper thigh pain that sounds like a muscle spasm however patient is quite worried about possibility of blood clot since she is on birth control.  I plan to obtain a D-dimer, and will only obtain lower extremity venous Doppler if D-dimer is positive.  Otherwise she can follow-up with Dr. Percival Spanish in 4 to 6 weeks.   Past Medical History:  Diagnosis Date   Anxiety    Back pain    Chest pain    a. 09/2014 Echo: EF 60-65%, no rwma, nl valves;  b. 12/2014 Neg ETT.   Essential hypertension    a. 01/2016 Renal duplex: no RAS- ? renal cyst (renal u/s 4/17 - no cyst, nl study); b. 02/2016 24 hr BP Monitor: Mean SBP ~ 138 with Max of 198. Highest pressures noted in evening hours following placement of cuff.   Fibroid uterus    GERD (gastroesophageal reflux disease)    Palpitations    a. 09/2014 nl event monitor.    Past Surgical History:  Procedure Laterality Date   ESOPHAGOGASTRODUODENOSCOPY  01/2020   WISDOM TOOTH EXTRACTION      Current Medications: Current Meds  Medication Sig   acetaminophen (TYLENOL) 500 MG tablet Take 500 mg by mouth every 6 (six) hours as needed (back pain/ cramps).    atenolol (TENORMIN) 25 MG  tablet Take 6.25 mg by mouth 2 (two) times daily.    blood glucose meter kit and supplies Test blood sugar twice daily. Dx code: R73.09   busPIRone (BUSPAR) 10 MG tablet Take 10 mg by mouth daily.   cholecalciferol (VITAMIN D3) 25 MCG (1000 UNIT) tablet Take 1,000 Units by mouth daily.   ELDERBERRY PO Take 1 capsule by mouth daily.   famotidine (PEPCID) 20 MG tablet Take 1 tablet (20 mg total) by mouth 2 (two) times daily.   fluticasone (FLONASE) 50 MCG/ACT nasal spray Place 2 sprays into both nostrils daily. (Patient taking differently: Place 2 sprays into both nostrils as needed (seasonal allergies). )   glucose blood (ONETOUCH VERIO) test  strip TEST BLOOD SUGAR DAILY   isosorbide mononitrate (IMDUR) 60 MG 24 hr tablet Take by mouth.   levalbuterol (XOPENEX HFA) 45 MCG/ACT inhaler Inhale 1-2 puffs every 6 hours if needed- rescue inhaler (Patient taking differently: Inhale 1-2 puffs into the lungs every 6 (six) hours as needed for wheezing. )   LORazepam (ATIVAN) 0.5 MG tablet Take 1 tablet (0.5 mg total) by mouth every 6 (six) hours as needed for anxiety. (Patient taking differently: Take 0.25 mg by mouth 3 (three) times daily as needed for anxiety. )   Multiple Vitamins-Minerals (MULTIVITAMIN GUMMIES ADULT PO) Take 2 tablets by mouth daily.    Norethindrone-Ethinyl Estradiol-Fe Biphas (LO LOESTRIN FE) 1 MG-10 MCG / 10 MCG tablet Take 1 tablet by mouth at bedtime.    ONETOUCH DELICA LANCETS 75O MISC USE TO TEST BLOOD SUGAR DAILY   pantoprazole (PROTONIX) 40 MG tablet Take 1 tablet (40 mg total) by mouth 2 (two) times daily.   vitamin B-12 (CYANOCOBALAMIN) 1000 MCG tablet Take 1,000 mcg by mouth daily.      Allergies:   Penicillins, Tamiflu [oseltamivir phosphate], Tinidazole, and Diltiazem   Social History   Socioeconomic History   Marital status: Single    Spouse name: N/A   Number of children: 0   Years of education: 16   Highest education level: Not on file  Occupational History   Occupation: Research scientist (physical sciences): [employer]  Tobacco Use   Smoking status: Former Smoker    Packs/day: 0.25    Years: 10.00    Pack years: 2.50    Types: Cigarettes    Start date: 06/25/2000    Quit date: 08/19/2014    Years since quitting: 5.5   Smokeless tobacco: Never Used  Substance and Sexual Activity   Alcohol use: No    Alcohol/week: 0.0 standard drinks   Drug use: No   Sexual activity: Not Currently    Partners: Male    Birth control/protection: Pill  Other Topics Concern   Not on file  Social History Narrative   Patient lives at home alone .   Patient works full time at [employer]    Education college.   Right handed.   Caffeine none   Social Determinants of Health   Financial Resource Strain:    Difficulty of Paying Living Expenses:   Food Insecurity:    Worried About Charity fundraiser in the Last Year:    Arboriculturist in the Last Year:   Transportation Needs:    Film/video editor (Medical):    Lack of Transportation (Non-Medical):   Physical Activity:    Days of Exercise per Week:    Minutes of Exercise per Session:   Stress:    Feeling of Stress :  Social Connections:    Frequency of Communication with Friends and Family:    Frequency of Social Gatherings with Friends and Family:    Attends Religious Services:    Active Member of Clubs or Organizations:    Attends Music therapist:    Marital Status:      Family History: The patient's family history includes Alzheimer's disease in her maternal grandmother; Cancer in her father; Diabetes (age of onset: 42) in her father; Epilepsy in her mother; Heart disease in her maternal grandfather; Heart disease (age of onset: 39) in her father; Hypertension in her father; Seizures in her mother. There is no history of Colon cancer or Breast cancer.  ROS:   Please see the history of present illness.     All other systems reviewed and are negative.  EKGs/Labs/Other Studies Reviewed:    The following studies were reviewed today:  Coronary CT 06/18/2019 IMPRESSION: 1. Coronary calcium score of 0. This was 0 percentile for age and sex matched control.  2. Normal coronary origin with right dominance.  3. No evidence of CAD in the LAD or LCx. No CAD in the visualized portions of the RCA but cannot comment on the PDA or PL which were poorly visualized due to motion artifact.   EKG:  EKG is ordered today.  The ekg ordered today demonstrates normal sinus rhythm without significant ST-T wave changes  Recent Labs: 11/13/2019: TSH 0.74 01/31/2020: ALT 16; BUN 13; Creatinine,  Ser 1.06; Hemoglobin 12.3; Platelets 219; Potassium 3.7; Sodium 140  Recent Lipid Panel    Component Value Date/Time   CHOL 172 10/25/2016 1850   TRIG 87 10/25/2016 1850   HDL 49 10/25/2016 1850   CHOLHDL 3.5 10/25/2016 1850   LDLCALC 106 (H) 10/25/2016 1850   LDLDIRECT 101 (H) 10/25/2016 1850    Physical Exam:    VS:  BP 136/78    Pulse 73    Temp (!) 97.3 F (36.3 C) Comment: Forehead   Ht 5' 4"  (1.626 m)    Wt 189 lb 9.6 oz (86 kg)    SpO2 100%    BMI 32.54 kg/m     Wt Readings from Last 3 Encounters:  02/25/20 189 lb 9.6 oz (86 kg)  02/10/20 185 lb (83.9 kg)  01/31/20 160 lb (72.6 kg)     GEN:  Well nourished, well developed in no acute distress HEENT: Normal NECK: No JVD; No carotid bruits LYMPHATICS: No lymphadenopathy CARDIAC: RRR, no murmurs, rubs, gallops RESPIRATORY:  Clear to auscultation without rales, wheezing or rhonchi  ABDOMEN: Soft, non-tender, non-distended MUSCULOSKELETAL:  No edema; No deformity  SKIN: Warm and dry NEUROLOGIC:  Alert and oriented x 3 PSYCHIATRIC:  Normal affect   ASSESSMENT:    1. Atypical chest pain   2. Essential hypertension   3. Pain of lower extremity, unspecified laterality    PLAN:    In order of problems listed above:  1. Atypical chest pain: She has underwent a quite extensive cardiac work-up in the past.  Coronary CTA obtained in August 2020 showed a coronary calcium score of 0, normal coronary arteries.  Her chest pain is quite atypical.  She was recently seen by Dr. Edd Arbour who was concerned of microvascular ischemia.  Some of her chest discomfort is worse with exertion.  Dr. Edd Arbour has prescribed her some Imdur 30 mg which she has not started at this time.  I urged her to start on the trial of Imdur to see if this will  help   2. Hypertension: Blood pressure stable  3. Lower extremity pain: Suspicion for DVT fairly low, will obtain D-dimer to rule out.  If negative D-dimer, no further work-up is needed.   Medication  Adjustments/Labs and Tests Ordered: Current medicines are reviewed at length with the patient today.  Concerns regarding medicines are outlined above.  Orders Placed This Encounter  Procedures   D-dimer, quantitative (not at [employer])   EKG 12-Lead   No orders of the defined types were placed in this encounter.   Patient Instructions  Medication Instructions:  Your physician recommends that you continue on your current medications as directed. Please refer to the Current Medication list given to you today.  *If you need a refill on your cardiac medications before your next appointment, please call your pharmacy*  Lab Work: Your physician recommends that you return for lab work in [place]:  D-DIMER If you have labs (blood work) drawn today and your tests are completely normal, you will receive your results only by:  [place] (if you have [place]) OR  A paper copy in the mail If you have any lab test that is abnormal or we need to change your treatment, we will call you to review the results.  Testing/Procedures: NONE ordered at this time of appointment   Follow-Up: At [facility], you and your health needs are our priority.  As part of our continuing mission to provide you with exceptional heart care, we have created designated Provider Care Teams.  These Care Teams include your primary Cardiologist (physician) and Advanced Practice Providers (APPs -  Physician Assistants and Nurse Practitioners) who all work together to provide you with the care you need, when you need it.  Your next appointment:   4-6 week(s)  The format for your next appointment:   In Person  Provider:   Minus Breeding, MD  Other Instructions      Signed, Almyra Deforest, [place]  02/28/2020 12:05 AM    [place]

## 2020-02-25 NOTE — Patient Instructions (Addendum)
Medication Instructions:  Your physician recommends that you continue on your current medications as directed. Please refer to the Current Medication list given to you today.  *If you need a refill on your cardiac medications before your next appointment, please call your pharmacy*  Lab Work: Your physician recommends that you return for lab work in [place]:  D-DIMER If you have labs (blood work) drawn today and your tests are completely normal, you will receive your results only by: Marland Kitchen MyChart Message (if you have MyChart) OR . A paper copy in the mail If you have any lab test that is abnormal or we need to change your treatment, we will call you to review the results.  Testing/Procedures: NONE ordered at this time of appointment   Follow-Up: At [facility], you and your health needs are our priority.  As part of our continuing mission to provide you with exceptional heart care, we have created designated Provider Care Teams.  These Care Teams include your primary Cardiologist (physician) and Advanced Practice Providers (APPs -  Physician Assistants and Nurse Practitioners) who all work together to provide you with the care you need, when you need it.  Your next appointment:   4-6 week(s)  The format for your next appointment:   In Person  Provider:   Minus Breeding, MD  Other Instructions

## 2020-02-26 LAB — D-DIMER, QUANTITATIVE: D-DIMER: 0.36 mg/L FEU (ref 0.00–0.49)

## 2020-02-27 DIAGNOSIS — L989 Disorder of the skin and subcutaneous tissue, unspecified: Secondary | ICD-10-CM | POA: Diagnosis not present

## 2020-02-27 DIAGNOSIS — K633 Ulcer of intestine: Secondary | ICD-10-CM | POA: Diagnosis not present

## 2020-02-27 DIAGNOSIS — R202 Paresthesia of skin: Secondary | ICD-10-CM | POA: Diagnosis not present

## 2020-02-27 DIAGNOSIS — T7840XA Allergy, unspecified, initial encounter: Secondary | ICD-10-CM | POA: Diagnosis not present

## 2020-02-27 DIAGNOSIS — M79604 Pain in right leg: Secondary | ICD-10-CM | POA: Diagnosis not present

## 2020-02-27 DIAGNOSIS — I10 Essential (primary) hypertension: Secondary | ICD-10-CM | POA: Diagnosis not present

## 2020-02-27 DIAGNOSIS — R519 Headache, unspecified: Secondary | ICD-10-CM | POA: Diagnosis not present

## 2020-02-27 NOTE — Progress Notes (Signed)
D-dimer negative, no concern for blood clot

## 2020-02-28 ENCOUNTER — Telehealth: Payer: Self-pay | Admitting: Cardiology

## 2020-02-28 NOTE — Telephone Encounter (Signed)
Patient states she is requesting results from D-dimer completed on 02/25/20. Please return call to discuss.

## 2020-02-28 NOTE — Telephone Encounter (Signed)
Let the patient know that her D dimer was within normal limits and we would call back if Dr. Percival Spanish has any further instructions

## 2020-03-03 ENCOUNTER — Telehealth: Payer: Self-pay | Admitting: Physician Assistant

## 2020-03-03 NOTE — Telephone Encounter (Signed)
Pt c/o medication issue:  1. Name of Medication: isosorbide mononitrate (IMDUR) 60 MG 24 hr tablet  2. How are you currently taking this medication (dosage and times per day)? Has not started yet, just picked it up today but it states to take 1x daily  3. Are you having a reaction (difficulty breathing--STAT)? no  4. What is your medication issue? Patient states that she told Almyra Deforest at her last OV that the medication was 30MG  but she wanted to advise him that it is for 60MG .

## 2020-03-03 NOTE — Telephone Encounter (Signed)
Voicemail at 03:29pm.

## 2020-03-03 NOTE — Telephone Encounter (Signed)
Pls let her know that these are very minor changes seen that do not cause any symptoms. These are usually due to hardening of the very small blood vessels in the brain that we see in patients with a history of elevated BP, even if this was in the past, these changes can still be seen. These do not need to be closely monitored and they would not lead to aneurysms. The main thing to do it continue to make sure her BP, cholesterol, and sugar levels are controlled.

## 2020-03-03 NOTE — Telephone Encounter (Signed)
Patient calling to update records. She told Almyra Deforest, PA-C that Imdur was 30mg  but it is actually 60mg . Medication list reflects current prescription.

## 2020-03-04 NOTE — Telephone Encounter (Signed)
Pls let her know she will be put on a cancellation list, thanks

## 2020-03-04 NOTE — Telephone Encounter (Signed)
Patient was called and was not satisified with the results. Would like to make an appt or you call her.

## 2020-03-05 MED ORDER — ISOSORBIDE MONONITRATE ER 30 MG PO TB24: 15.0000 mg | ORAL_TABLET | Freq: Every day | ORAL | 6 refills | Status: DC

## 2020-03-05 NOTE — Telephone Encounter (Signed)
I spoke with patient. She started Imdur 60 mg yesterday afternoon. Prior to taking Imdur BP was 129/76. Later in the evening around 10 PM BP was 110/74, heart rate 84.  She developed a severe headache last evening. She was aware headache is a side effect of Imdur but she does not think she can tolerate this type of headache long term.  She took tylenol last night which helped a little but she still has headache this AM. Had palpitations during the night. Describes as feeling like a vibration in her ear.  This AM her BP is 95/55.  I told her should follow label instructions for tylenol and could take another now if it has been long enough since she took last one.  I asked her to check BP in a few hours.  She will hold Imdur for now and I will send to Almyra Deforest, PA for recommendations. Patient is willing to try lower dose of Imdur.

## 2020-03-05 NOTE — Telephone Encounter (Signed)
Let's try a lower dose of imdur. Instead of 60mg , let's try 15mg  daily. Note, there are no 15mg  tablet, lowest dose would be 30 mg tablet, and she has to cut it in half. Thanks

## 2020-03-05 NOTE — Telephone Encounter (Signed)
Follow up  Pt c/o medication issue:  1. Name of Medication: isosorbide mononitrate (IMDUR) 60 MG 24 hr tablet  2. How are you currently taking this medication (dosage and times per day)? As directed  3. Are you having a reaction (difficulty breathing--STAT)? yes  4. What is your medication issue? Patient is experiencing a drop in blood pressure (95/55) and excessive headache that last for hours. Please give a call back to advise.

## 2020-03-05 NOTE — Addendum Note (Signed)
Addended byEarley Favor, Mardene Celeste L on: 03/05/2020 03:00 PM   Modules accepted: Orders

## 2020-03-05 NOTE — Telephone Encounter (Signed)
I spoke with patient and gave her information from [place], [place].  Will send new prescription to [facility] .

## 2020-03-05 NOTE — Telephone Encounter (Signed)
I advised to patient.

## 2020-03-14 ENCOUNTER — Other Ambulatory Visit: Payer: Self-pay | Admitting: Family Medicine

## 2020-03-14 DIAGNOSIS — R7303 Prediabetes: Secondary | ICD-10-CM

## 2020-03-16 ENCOUNTER — Telehealth: Payer: Self-pay | Admitting: Physician Assistant

## 2020-03-16 ENCOUNTER — Ambulatory Visit: Payer: BC Managed Care – PPO | Attending: Internal Medicine

## 2020-03-16 DIAGNOSIS — Z20822 Contact with and (suspected) exposure to covid-19: Secondary | ICD-10-CM | POA: Diagnosis not present

## 2020-03-16 NOTE — Telephone Encounter (Signed)
Spoke with patient. Patient reports she has cut IMDUR to 15mg  daily for the last two weeks. She takes it at 3p-4p. She has had continued fatigue, grogginess, headaches and dizziness. Patient wants to discontinue IMDUR but was told by her pharmacist to see if another medication can be started. She is also wanting to know if she can stop it immediately or if she needs to be weaned off. Patient is concerned that her chest pain may be worse if she stops the IMDUR. She would like Almyra Deforest, [place] to advise her.

## 2020-03-16 NOTE — Telephone Encounter (Signed)
Pt c/o medication issue:  1. Name of Medication: isosorbide mononitrate (IMDUR) 30 MG 24 hr tablet 1/2 tablet 15 mg.   2. How are you currently taking this medication (dosage and times per day)? 1/2 tablet 15 mg once daily   3. Are you having a reaction (difficulty breathing--STAT)?   4. What is your medication issue? Patient is considering stopping this medication, but she wants to discuss it first with Almyra Deforest. She states her head is not clear, she gets dizzy from it and it make her BP low.

## 2020-03-17 ENCOUNTER — Other Ambulatory Visit: Payer: Self-pay | Admitting: Medical

## 2020-03-17 ENCOUNTER — Ambulatory Visit: Payer: Self-pay

## 2020-03-17 LAB — SARS-COV-2, NAA 2 DAY TAT

## 2020-03-17 LAB — NOVEL CORONAVIRUS, NAA: SARS-CoV-2, NAA: NOT DETECTED

## 2020-03-18 ENCOUNTER — Ambulatory Visit: Payer: BC Managed Care – PPO | Admitting: Gastroenterology

## 2020-03-18 ENCOUNTER — Ambulatory Visit: Payer: BC Managed Care – PPO | Attending: Internal Medicine

## 2020-03-18 ENCOUNTER — Ambulatory Visit: Payer: BC Managed Care – PPO

## 2020-03-18 DIAGNOSIS — Z23 Encounter for immunization: Secondary | ICD-10-CM

## 2020-03-18 NOTE — Progress Notes (Signed)
   Covid-19 Vaccination Clinic  Name:  Brooke Turner    MRN: [id number] DOB: 06-Apr-1976  03/18/2020  [name] was observed post Covid-19 immunization for 15 minutes without incident. She was provided with Vaccine Information Sheet and instruction to access the V-Safe system.   [name] was instructed to call 911 with any severe reactions post vaccine: Marland Kitchen Difficulty breathing  . Swelling of face and throat  . A fast heartbeat  . A bad rash all over body  . Dizziness and weakness   Immunizations Administered    Name Date Dose VIS Date Route   Pfizer COVID-19 Vaccine 03/18/2020  3:13 PM 0.3 mL 01/01/2019 Intramuscular   Manufacturer: [place]   Lot: [id number]   [place]: [id number]

## 2020-03-18 NOTE — Telephone Encounter (Signed)
This is Dr. Hochrein's pt. °

## 2020-03-24 ENCOUNTER — Telehealth: Payer: Self-pay

## 2020-03-24 NOTE — Telephone Encounter (Signed)
Left a detailed voice message for the patient to keep her upcoming scheduled appointment with Dr. Percival Spanish and explained that there is not a need for 2 appointments

## 2020-03-26 ENCOUNTER — Telehealth: Payer: Self-pay | Admitting: Internal Medicine

## 2020-03-26 NOTE — Telephone Encounter (Signed)
Contacted patient to inform her about her appt and to let her know the nurse from  last provider(Hao) she saw Ellison Carwin) stated she no longer needed the follow up visit with Isaac Laud because she had one scheduled with Hochrein. Patient wanted to reschedule appt but then decided to wait until a later time but wanted to know her options for later date and time but the appt has not been changed still remains 04/03/20

## 2020-03-30 ENCOUNTER — Ambulatory Visit: Payer: BC Managed Care – PPO | Attending: Internal Medicine

## 2020-03-30 DIAGNOSIS — Z20822 Contact with and (suspected) exposure to covid-19: Secondary | ICD-10-CM

## 2020-03-31 LAB — SARS-COV-2, NAA 2 DAY TAT

## 2020-03-31 LAB — NOVEL CORONAVIRUS, NAA: SARS-CoV-2, NAA: NOT DETECTED

## 2020-04-02 NOTE — Progress Notes (Signed)
Cardiology Office Note   Date:  04/03/2020   ID:  [place] Giammona, DOB [birth year], MRN [id number]  PCP:  [name], MD  Cardiologist:   Minus Breeding, MD  Chief Complaint  Patient presents with  . Chest Pain      History of Present Illness: Brooke Turner is a 44 y.o. female who presents for evaluation of chest pain.  She was to start Imdur but she had concerns about this.  She eventually started 60 mg but had multiple symptoms related to this.  She eventually started 15 mg.  She thinks she is doing okay with this although she has fluctuating blood pressures.  She was also started on pantoprazole and she thinks her chest pain is improved.  She is not having any new shortness of breath, PND or orthopnea.  She is not having any palpitations, presyncope or syncope.  She had no weight gain or edema.    Past Medical History:  Diagnosis Date  . Anxiety   . Back pain   . Chest pain    a. 09/2014 Echo: EF 60-65%, no rwma, nl valves;  b. 12/2014 Neg ETT.  . Essential hypertension    a. 01/2016 Renal duplex: no RAS- ? renal cyst (renal u/s 4/17 - no cyst, nl study); b. 02/2016 24 hr BP Monitor: Mean SBP ~ 138 with Max of 198. Highest pressures noted in evening hours following placement of cuff.  . Fibroid uterus   . GERD (gastroesophageal reflux disease)   . Palpitations    a. 09/2014 nl event monitor.    Past Surgical History:  Procedure Laterality Date  . ESOPHAGOGASTRODUODENOSCOPY  01/2020  . WISDOM TOOTH EXTRACTION       Current Outpatient Medications  Medication Sig Dispense Refill  . acetaminophen (TYLENOL) 500 MG tablet Take 500 mg by mouth every 6 (six) hours as needed (back pain/ cramps).     Marland Kitchen atenolol (TENORMIN) 25 MG tablet Take 1 tablet (25 mg total) by mouth 2 (two) times daily. 90 tablet 0  . blood glucose meter kit and supplies Test blood sugar twice daily. Dx code: R73.09 1 each 0  . busPIRone (BUSPAR) 10 MG tablet Take 10 mg by mouth  daily.    . cholecalciferol (VITAMIN D3) 25 MCG (1000 UNIT) tablet Take 1,000 Units by mouth daily.    Marland Kitchen ELDERBERRY PO Take 1 capsule by mouth daily.    . famotidine (PEPCID) 20 MG tablet Take 1 tablet (20 mg total) by mouth 2 (two) times daily. 180 tablet 0  . fluticasone (FLONASE) 50 MCG/ACT nasal spray Place 2 sprays into both nostrils daily. (Patient taking differently: Place 2 sprays into both nostrils as needed (seasonal allergies). ) 16 g 0  . glucose blood (ONETOUCH VERIO) test strip TEST BLOOD SUGAR DAILY 25 each 10  . isosorbide mononitrate (IMDUR) 30 MG 24 hr tablet Take 0.5 tablets (15 mg total) by mouth daily. 15 tablet 6  . levalbuterol (XOPENEX HFA) 45 MCG/ACT inhaler Inhale 1-2 puffs every 6 hours if needed- rescue inhaler (Patient taking differently: Inhale 1-2 puffs into the lungs every 6 (six) hours as needed for wheezing. ) 1 Inhaler 12  . LORazepam (ATIVAN) 0.5 MG tablet Take 1 tablet (0.5 mg total) by mouth every 6 (six) hours as needed for anxiety. (Patient taking differently: Take 0.25 mg by mouth 3 (three) times daily as needed for anxiety. ) 15 tablet 0  . Multiple Vitamins-Minerals (MULTIVITAMIN GUMMIES ADULT PO) Take 2  tablets by mouth daily.     . Norethindrone-Ethinyl Estradiol-Fe Biphas (LO LOESTRIN FE) 1 MG-10 MCG / 10 MCG tablet Take 1 tablet by mouth at bedtime.     [name] DELICA LANCETS 88B MISC USE TO TEST BLOOD SUGAR DAILY 100 each 3  . pantoprazole (PROTONIX) 40 MG tablet Take 1 tablet (40 mg total) by mouth 2 (two) times daily. 60 tablet 1  . vitamin B-12 (CYANOCOBALAMIN) 1000 MCG tablet Take 1,000 mcg by mouth daily.      No current facility-administered medications for this visit.    Allergies:   Penicillins, Tamiflu [oseltamivir phosphate], Tinidazole, and Diltiazem    ROS:  Please see the history of present illness.   Otherwise, review of systems are positive for none.   All other systems are reviewed and negative.    PHYSICAL EXAM: VS:  BP  128/82   Pulse 73   Ht _0  (1.626 m)   Wt 196 lb (88.9 kg)   SpO2 99%   BMI 33.64 kg/m  , BMI Body mass index is 33.64 kg/m. GENERAL:  Well appearing NECK:  No jugular venous distention, waveform within normal limits, carotid upstroke brisk and symmetric, no bruits, no thyromegaly LUNGS:  Clear to auscultation bilaterally CHEST:  Unremarkable HEART:  PMI not displaced or sustained,S1 and S2 within normal limits, no S3, no S4, no clicks, no rubs, no murmurs ABD:  Flat, positive bowel sounds normal in frequency in pitch, no bruits, no rebound, no guarding, no midline pulsatile mass, no hepatomegaly, no splenomegaly EXT:  2 plus pulses throughout, no edema, no cyanosis no clubbing    EKG:  EKG is not ordered today.    Recent Labs: 11/13/2019: TSH 0.74 01/31/2020: ALT 16; BUN 13; Creatinine, Ser 1.06; Hemoglobin 12.3; Platelets 219; Potassium 3.7; Sodium 140    Lipid Panel    Component Value Date/Time   CHOL 172 10/25/2016 1850   TRIG 87 10/25/2016 1850   HDL 49 10/25/2016 1850   CHOLHDL 3.5 10/25/2016 1850   LDLCALC 106 (H) 10/25/2016 1850   LDLDIRECT 101 (H) 10/25/2016 1850      Wt Readings from Last 3 Encounters:  04/03/20 196 lb (88.9 kg)  02/25/20 189 lb 9.6 oz (86 kg)  02/10/20 185 lb (83.9 kg)      Other studies Reviewed: Additional studies/ records that were reviewed today include: ED records from January, February and March x2. Review of the above records demonstrates:  Please see elsewhere in the note.     ASSESSMENT AND PLAN:  CHEST PAIN: We had a long discussion.  She has had no obstructive coronary disease noted.  It is true that some of the small branches posteriorly were not well visualized.  However, I went back and looked through different emergency room visits which presented with chest pain and her high-sensitivity troponin was always normal.  There were no EKG changes.  I think is unlikely that she has any ischemia.  She wants to try to come off the  Imdur and I think it is fine.  She thinks maybe the pantoprazole helped.  No further work-up is indicated.  PALPITATIONS: She had monitors with no significant arrhythmia.  No change in therapy.  COVID EDUCATION: She did have her vaccine.  Current medicines are reviewed at length with the patient today.  The patient does not have concerns regarding medicines.  The following changes have been made:  no change  Labs/ tests ordered today include: None No orders of the  defined types were placed in this encounter.    Disposition:   FU with me in one year.     Signed, Minus Breeding, MD  04/03/2020 1:52 PM    [facility]

## 2020-04-03 ENCOUNTER — Ambulatory Visit (INDEPENDENT_AMBULATORY_CARE_PROVIDER_SITE_OTHER): Payer: BC Managed Care – PPO | Admitting: Cardiology

## 2020-04-03 ENCOUNTER — Other Ambulatory Visit: Payer: Self-pay

## 2020-04-03 ENCOUNTER — Telehealth: Payer: Self-pay | Admitting: Physician Assistant

## 2020-04-03 ENCOUNTER — Encounter: Payer: Self-pay | Admitting: Cardiology

## 2020-04-03 VITALS — BP 128/82 | HR 73 | Ht 64.0 in | Wt 196.0 lb

## 2020-04-03 DIAGNOSIS — R079 Chest pain, unspecified: Secondary | ICD-10-CM

## 2020-04-03 NOTE — Telephone Encounter (Signed)
Please advise 

## 2020-04-03 NOTE — Telephone Encounter (Signed)
After 2 months of BID dosing pf Pantoprazole she can decrease back to qd.   It will likely be something she needs to remain on long term especially since we could not identify any real triggers for her symptoms, but if in 6 mos she wants to try to stop it she can go to eod dosing for 2 weeks and then every 3rd day for a week and then stop.  JLL

## 2020-04-03 NOTE — Telephone Encounter (Signed)
Pt inquired how long she will have to take pantoprazole.  Will this be a long-term drug?

## 2020-04-03 NOTE — Telephone Encounter (Signed)
Patient will continue Pantoprazole. Patient also states that her PCP recommended her start a low dose aspirin but in her EGD instructions told her to avoid aspirin, ibuprofen, etc. Patient wants to know is it safe for her to start a low dose aspirin?

## 2020-04-03 NOTE — Patient Instructions (Signed)
Medication Instructions:  TRIAL OFF OF IMDUR *If you need a refill on your cardiac medications before your next appointment, please call your pharmacy*  Lab Work: NONE ORDERED THIS VISIT  Testing/Procedures: NONE ORDERED THIS VISIT  Follow-Up: At [facility], you and your health needs are our priority.  As part of our continuing mission to provide you with exceptional heart care, we have created designated Provider Care Teams.  These Care Teams include your primary Cardiologist (physician) and Advanced Practice Providers (APPs -  Physician Assistants and Nurse Practitioners) who all work together to provide you with the care you need, when you need it.  Your next appointment:   12 month(s)   You will receive a reminder letter in the mail two months in advance. If you don't receive a letter, please call our office to schedule the follow-up appointment.  The format for your next appointment:   In Person  Provider:   Minus Breeding, MD

## 2020-04-04 NOTE — Telephone Encounter (Signed)
Low dose ASA ok  Thanks-JLL

## 2020-04-07 NOTE — Telephone Encounter (Signed)
Left message for patient to call the office

## 2020-04-09 NOTE — Telephone Encounter (Signed)
Patient has been instructed that a low dose aspirin is ok, I advised to not take on an empty stomach.

## 2020-04-24 DIAGNOSIS — R002 Palpitations: Secondary | ICD-10-CM | POA: Diagnosis not present

## 2020-04-24 DIAGNOSIS — I10 Essential (primary) hypertension: Secondary | ICD-10-CM | POA: Diagnosis not present

## 2020-04-24 DIAGNOSIS — R0789 Other chest pain: Secondary | ICD-10-CM | POA: Diagnosis not present

## 2020-04-24 DIAGNOSIS — F418 Other specified anxiety disorders: Secondary | ICD-10-CM | POA: Diagnosis not present

## 2020-04-30 DIAGNOSIS — R5383 Other fatigue: Secondary | ICD-10-CM | POA: Diagnosis not present

## 2020-04-30 DIAGNOSIS — E876 Hypokalemia: Secondary | ICD-10-CM | POA: Diagnosis not present

## 2020-04-30 DIAGNOSIS — R110 Nausea: Secondary | ICD-10-CM | POA: Diagnosis not present

## 2020-05-06 DIAGNOSIS — R519 Headache, unspecified: Secondary | ICD-10-CM | POA: Diagnosis not present

## 2020-05-06 DIAGNOSIS — R079 Chest pain, unspecified: Secondary | ICD-10-CM | POA: Diagnosis not present

## 2020-05-06 DIAGNOSIS — I10 Essential (primary) hypertension: Secondary | ICD-10-CM | POA: Diagnosis not present

## 2020-05-13 ENCOUNTER — Telehealth: Payer: Self-pay | Admitting: Hematology and Oncology

## 2020-05-13 NOTE — Telephone Encounter (Signed)
Received a new hem referral Dr. Brigitte Pulse for thalassemia. Ms. Brooke Turner has been cld and scheduled to see Dr. Lorenso Courier on 7/29 at 1pm. Pt aware to arrive 15 minutes early.

## 2020-05-19 ENCOUNTER — Telehealth: Payer: Self-pay | Admitting: Physician Assistant

## 2020-05-19 NOTE — Telephone Encounter (Signed)
Left message on machine to call back  

## 2020-05-20 NOTE — Telephone Encounter (Signed)
Left message on machine to call back  

## 2020-05-21 NOTE — Telephone Encounter (Signed)
Placed several calls to the pt with voice mails for her to return our call she still has questions.  Will await further communication from the pt.

## 2020-05-27 DIAGNOSIS — R002 Palpitations: Secondary | ICD-10-CM | POA: Diagnosis not present

## 2020-05-27 DIAGNOSIS — F418 Other specified anxiety disorders: Secondary | ICD-10-CM | POA: Diagnosis not present

## 2020-05-27 DIAGNOSIS — I10 Essential (primary) hypertension: Secondary | ICD-10-CM | POA: Diagnosis not present

## 2020-05-27 DIAGNOSIS — R0789 Other chest pain: Secondary | ICD-10-CM | POA: Diagnosis not present

## 2020-06-02 ENCOUNTER — Ambulatory Visit: Payer: BC Managed Care – PPO | Admitting: Gastroenterology

## 2020-06-03 ENCOUNTER — Ambulatory Visit: Payer: BC Managed Care – PPO | Attending: Internal Medicine

## 2020-06-03 DIAGNOSIS — K219 Gastro-esophageal reflux disease without esophagitis: Secondary | ICD-10-CM | POA: Insufficient documentation

## 2020-06-03 DIAGNOSIS — E119 Type 2 diabetes mellitus without complications: Secondary | ICD-10-CM | POA: Insufficient documentation

## 2020-06-03 DIAGNOSIS — Z20822 Contact with and (suspected) exposure to covid-19: Secondary | ICD-10-CM

## 2020-06-04 ENCOUNTER — Inpatient Hospital Stay: Payer: BC Managed Care – PPO | Attending: Hematology and Oncology | Admitting: Hematology and Oncology

## 2020-06-04 ENCOUNTER — Inpatient Hospital Stay: Payer: BC Managed Care – PPO

## 2020-06-04 ENCOUNTER — Other Ambulatory Visit: Payer: Self-pay

## 2020-06-04 ENCOUNTER — Ambulatory Visit (INDEPENDENT_AMBULATORY_CARE_PROVIDER_SITE_OTHER): Payer: BC Managed Care – PPO | Admitting: Gastroenterology

## 2020-06-04 ENCOUNTER — Other Ambulatory Visit (INDEPENDENT_AMBULATORY_CARE_PROVIDER_SITE_OTHER): Payer: BC Managed Care – PPO

## 2020-06-04 ENCOUNTER — Encounter: Payer: Self-pay | Admitting: Hematology and Oncology

## 2020-06-04 ENCOUNTER — Other Ambulatory Visit: Payer: Self-pay | Admitting: Hematology and Oncology

## 2020-06-04 ENCOUNTER — Encounter: Payer: Self-pay | Admitting: Gastroenterology

## 2020-06-04 VITALS — BP 137/84 | HR 64 | Temp 98.1°F | Resp 17 | Ht 64.0 in | Wt 197.7 lb

## 2020-06-04 VITALS — BP 122/84 | HR 73 | Ht 63.0 in | Wt 195.0 lb

## 2020-06-04 DIAGNOSIS — R110 Nausea: Secondary | ICD-10-CM | POA: Diagnosis not present

## 2020-06-04 DIAGNOSIS — R140 Abdominal distension (gaseous): Secondary | ICD-10-CM | POA: Diagnosis not present

## 2020-06-04 DIAGNOSIS — Z832 Family history of diseases of the blood and blood-forming organs and certain disorders involving the immune mechanism: Secondary | ICD-10-CM | POA: Diagnosis not present

## 2020-06-04 DIAGNOSIS — G4733 Obstructive sleep apnea (adult) (pediatric): Secondary | ICD-10-CM | POA: Insufficient documentation

## 2020-06-04 DIAGNOSIS — D500 Iron deficiency anemia secondary to blood loss (chronic): Secondary | ICD-10-CM

## 2020-06-04 DIAGNOSIS — Z87891 Personal history of nicotine dependence: Secondary | ICD-10-CM | POA: Insufficient documentation

## 2020-06-04 DIAGNOSIS — F419 Anxiety disorder, unspecified: Secondary | ICD-10-CM | POA: Insufficient documentation

## 2020-06-04 LAB — CBC WITH DIFFERENTIAL (CANCER CENTER ONLY)
Abs Immature Granulocytes: 0.01 10*3/uL (ref 0.00–0.07)
Basophils Absolute: 0 10*3/uL (ref 0.0–0.1)
Basophils Relative: 1 %
Eosinophils Absolute: 0 10*3/uL (ref 0.0–0.5)
Eosinophils Relative: 1 %
HCT: 37.9 % (ref 36.0–46.0)
Hemoglobin: 12.5 g/dL (ref 12.0–15.0)
Immature Granulocytes: 0 %
Lymphocytes Relative: 44 %
Lymphs Abs: 2.8 10*3/uL (ref 0.7–4.0)
MCH: 29.1 pg (ref 26.0–34.0)
MCHC: 33 g/dL (ref 30.0–36.0)
MCV: 88.1 fL (ref 80.0–100.0)
Monocytes Absolute: 0.7 10*3/uL (ref 0.1–1.0)
Monocytes Relative: 10 %
Neutro Abs: 2.9 10*3/uL (ref 1.7–7.7)
Neutrophils Relative %: 44 %
Platelet Count: 261 10*3/uL (ref 150–400)
RBC: 4.3 MIL/uL (ref 3.87–5.11)
RDW: 12.5 % (ref 11.5–15.5)
WBC Count: 6.4 10*3/uL (ref 4.0–10.5)
nRBC: 0 % (ref 0.0–0.2)

## 2020-06-04 LAB — CMP (CANCER CENTER ONLY)
ALT: 12 U/L (ref 0–44)
AST: 18 U/L (ref 15–41)
Albumin: 3.6 g/dL (ref 3.5–5.0)
Alkaline Phosphatase: 58 U/L (ref 38–126)
Anion gap: 7 (ref 5–15)
BUN: 15 mg/dL (ref 6–20)
CO2: 26 mmol/L (ref 22–32)
Calcium: 9.1 mg/dL (ref 8.9–10.3)
Chloride: 105 mmol/L (ref 98–111)
Creatinine: 1.11 mg/dL — ABNORMAL HIGH (ref 0.44–1.00)
GFR, Est AFR Am: >60 mL/min (ref >60)
GFR, Estimated: 60 mL/min — ABNORMAL LOW (ref >60)
Glucose, Bld: 92 mg/dL (ref 70–99)
Potassium: 4.5 mmol/L (ref 3.5–5.1)
Sodium: 138 mmol/L (ref 135–145)
Total Bilirubin: 1.2 mg/dL (ref 0.3–1.2)
Total Protein: 7 g/dL (ref 6.5–8.1)

## 2020-06-04 LAB — FERRITIN: Ferritin: 4 ng/mL — ABNORMAL LOW (ref 11–307)

## 2020-06-04 LAB — RETIC PANEL
Immature Retic Fract: 11 % (ref 2.3–15.9)
RBC.: 4.27 MIL/uL (ref 3.87–5.11)
Retic Count, Absolute: 46.1 10*3/uL (ref 19.0–186.0)
Retic Ct Pct: 1.1 % (ref 0.4–3.1)
Reticulocyte Hemoglobin: 34.7 pg (ref >27.9)

## 2020-06-04 LAB — SARS-COV-2, NAA 2 DAY TAT

## 2020-06-04 LAB — IRON AND TIBC
Iron: 97 ug/dL (ref 41–142)
Saturation Ratios: 20 % — ABNORMAL LOW (ref 21–57)
TIBC: 489 ug/dL — ABNORMAL HIGH (ref 236–444)
UIBC: 391 ug/dL — ABNORMAL HIGH (ref 120–384)

## 2020-06-04 LAB — NOVEL CORONAVIRUS, NAA: SARS-CoV-2, NAA: NOT DETECTED

## 2020-06-04 LAB — IGA: IgA: 193 mg/dL (ref 68–378)

## 2020-06-04 MED ORDER — FERROUS SULFATE 325 (65 FE) MG PO TABS
325.0000 mg | ORAL_TABLET | ORAL | 1 refills | Status: DC
Start: 2020-06-04 — End: 2021-06-23

## 2020-06-04 MED ORDER — IBGARD 90 MG PO CPCR: ORAL_CAPSULE | ORAL | 0 refills | Status: DC

## 2020-06-04 MED ORDER — DICYCLOMINE HCL 20 MG PO TABS: 20.0000 mg | ORAL_TABLET | Freq: Four times a day (QID) | ORAL | 0 refills | Status: DC | PRN

## 2020-06-04 NOTE — Progress Notes (Signed)
Referring Provider: [name], MD Primary Care Physician:  [name], MD  Chief complaint:  Bloating, nausea, post-prandial diarrhea   IMPRESSION:  Bloating without diarrhea or constipation Nausea Post-prandial diarrhea No prior colon cancer screening Enlarged myomatous retroverted uterus seen on CT while in the ED 01/2020  Bloating, nausea, and post-prandial diarrhea: Differential includes altered fundic gland accomodation, functional dyspepsia, gastroparesis, celiac, SIBO.   Colonoscopy recommended for colon cancer screening.     PLAN: - TTGA, IgA - Dicyclomine 20 mg QID PRN  - Trial of IBGard (samples provided today) - Colonoscopy with evaluation of the TI - Xifaxan 550 mg TID x 14 days if no improvement to 1 month of treatment - Follow-up with GYN  The nature of the procedure, as well as the risks, benefits, and alternatives were carefully and thoroughly reviewed with the patient. Ample time for discussion and questions allowed. The patient understood, was satisfied, and agreed to proceed.  Please see the "Patient Instructions" section for addition details about the plan.  HPI: Brooke Turner is a 44 y.o. female who returns in follow-up.   01/16/2020 patient seen in clinic by [name], NP for belching, intermittent nausea and burning in her throat with atypical chest pain.  We discussed that all of her symptoms were chronic with several ED visits for atypical chest pain extensive cardiac evaluation which was negative.  Apparently chest pain or burning in throat had responded to acid blockers.  Told to continue pantoprazole plus Pepcid and have an EGD.  Patient also wanted an H. pylori test.  It was discussed that she would need to be off of PPI/H2 blockers for several days prior to the study and she did not think she could tolerate that at the time.    01/30/2020 EGD with LA grade C reflux esophagitis with no bleeding.  Otherwise normal.  Is recommended  patient increase her pantoprazole to 40 mg twice daily x8 weeks and continue Famotidine 20 mg twice daily.  Biopsies showed chronic inactive gastritis as well as GERD. There was no H pylori.     01/31/2020 patient called our clinic and complained of severe pain to her lower esophagus and unable to eat and swallow liquids without pain.  She was sent to the ED.    01/31/2020 ER visit for abdominal pain and lower chest pain and difficulty swallowing.  CMP, CBC and lipase normal.  Chest x-ray normal.  She was consulted by our service while in the ER.  She had declined GI cocktail.  She had a CT which was negative showing no acute abnormality except for an enlarged myomatous retroverted uterus.  It was discussed that she should have a CCK HIDA scan as an outpatient.  Is recommended she have hyoscyamine every 6- 8 hours.    02/10/20 office visit with [name] when she was feeling better her Carafate which she is using 1-2 times a day because "it so hard to take around my other medicines".  She is currently only using her Pantoprazole 40 mg daily and her Pepcid 20 mg twice daily.    Returns today for evaluation of new symptoms including post-prandial diarrhea, constant bloating, and nausea that seems to occur after taking pantoprazole. Symptoms started in March/April. Unintentional weight gain during Covid. Feels abdominal distension from the time that she wakes up. Exacerbated by eating - within 10 minutes of eating. Increased eructation - but this was actually worse last year. Feels a heart flutter prior to burping.  Feels constantly bloated. Associated nausea.  Experiences post-prandial diarrhea x 1 year. No constipation. No diarrhea.  No early satiety.  Has a daily bowel movement sometimes even twice a day.   Feels like she is gaining weight out of proportion to the exercise that she is doing. In fact, she is worried that she isn't eating enough.  Thyroid was normal.   Tums provided minimal relief.   Labs  06/04/20: normal CBC with a hemoglobin of 12.5, MCV 88, RDW 12.5  Mom with gallbladder problems. No known family history of colon cancer or polyps. No family history of uterine/endometrial cancer, pancreatic cancer or gastric/stomach cancer.   Past Medical History:  Diagnosis Date  . Anxiety   . Back pain   . Chest pain    a. 09/2014 Echo: EF 60-65%, no rwma, nl valves;  b. 12/2014 Neg ETT.  . Essential hypertension    a. 01/2016 Renal duplex: no RAS- ? renal cyst (renal u/s 4/17 - no cyst, nl study); b. 02/2016 24 hr BP Monitor: Mean SBP ~ 138 with Max of 198. Highest pressures noted in evening hours following placement of cuff.  . Fibroid uterus   . GERD (gastroesophageal reflux disease)   . Palpitations    a. 09/2014 nl event monitor.    Past Surgical History:  Procedure Laterality Date  . ESOPHAGOGASTRODUODENOSCOPY  01/2020  . WISDOM TOOTH EXTRACTION      Current Outpatient Medications  Medication Sig Dispense Refill  . acetaminophen (TYLENOL) 500 MG tablet Take 500 mg by mouth every 6 (six) hours as needed (back pain/ cramps).     Marland Kitchen atenolol (TENORMIN) 25 MG tablet Take 1 tablet (25 mg total) by mouth 2 (two) times daily. 90 tablet 0  . blood glucose meter kit and supplies Test blood sugar twice daily. Dx code: R73.09 1 each 0  . busPIRone (BUSPAR) 10 MG tablet Take 10 mg by mouth daily.    . cholecalciferol (VITAMIN D3) 25 MCG (1000 UNIT) tablet Take 1,000 Units by mouth daily.    Marland Kitchen ELDERBERRY PO Take 1 capsule by mouth daily.    . famotidine (PEPCID) 20 MG tablet Take 1 tablet (20 mg total) by mouth 2 (two) times daily. 180 tablet 0  . fluticasone (FLONASE) 50 MCG/ACT nasal spray Place 2 sprays into both nostrils daily. (Patient taking differently: Place 2 sprays into both nostrils as needed (seasonal allergies). ) 16 g 0  . glucose blood (ONETOUCH VERIO) test strip TEST BLOOD SUGAR DAILY 25 each 10  . levalbuterol (XOPENEX HFA) 45 MCG/ACT inhaler Inhale 1-2 puffs every 6 hours  if needed- rescue inhaler (Patient taking differently: Inhale 1-2 puffs into the lungs every 6 (six) hours as needed for wheezing. ) 1 Inhaler 12  . levocetirizine (XYZAL) 5 MG tablet levocetirizine 5 mg tablet  TAKE 1 TABLET BY MOUTH AT BEDTIME FOR 2 WEEKS THEN AS NEEDED    . LORazepam (ATIVAN) 0.5 MG tablet Take 1 tablet (0.5 mg total) by mouth every 6 (six) hours as needed for anxiety. (Patient taking differently: Take 0.25 mg by mouth 3 (three) times daily as needed for anxiety. ) 15 tablet 0  . meclizine (ANTIVERT) 25 MG tablet as needed. prn    . Multiple Vitamins-Minerals (MULTIVITAMIN GUMMIES ADULT PO) Take 2 tablets by mouth daily.     . Norethindrone-Ethinyl Estradiol-Fe Biphas (LO LOESTRIN FE) 1 MG-10 MCG / 10 MCG tablet Take 1 tablet by mouth at bedtime.     [name] DELICA LANCETS  33G MISC USE TO TEST BLOOD SUGAR DAILY 100 each 3  . SF 5000 PLUS 1.1 % CREA dental cream Take by mouth 2 (two) times daily.    . tranexamic acid (LYSTEDA) 650 MG TABS tablet tranexamic acid 650 mg tablet  TAKE 2 TABLETS BY MOUTH THREE TIMES DAILY    . vitamin B-12 (CYANOCOBALAMIN) 1000 MCG tablet Take 1,000 mcg by mouth daily.     . pantoprazole (PROTONIX) 40 MG tablet Take 1 tablet (40 mg total) by mouth 2 (two) times daily. 60 tablet 1   No current facility-administered medications for this visit.    Allergies as of 06/04/2020 - Review Complete 06/04/2020  Allergen Reaction Noted  . Penicillins Hives 01/04/2012  . Tamiflu [oseltamivir phosphate] Nausea And Vomiting 11/18/2013  . Tinidazole Other (See Comments) 09/14/2015  . Diltiazem Other (See Comments) 04/12/2016    Family History  Problem Relation Age of Onset  . Seizures Mother   . Epilepsy Mother   . Cancer Father        Liver  . Diabetes Father 36  . Hypertension Father   . Heart disease Father 77       CEA, LE [place]  . Alzheimer's disease Maternal Grandmother   . Heart disease Maternal Grandfather   . Colon cancer Neg Hx   .  Breast cancer Neg Hx     Social History   Socioeconomic History  . Marital status: Single    Spouse name: N/A  . Number of children: 0  . Years of education: 67  . Highest education level: Not on file  Occupational History  . Occupation: Research scientist (physical sciences): [employer]  Tobacco Use  . Smoking status: Former Smoker    Packs/day: 0.25    Years: 10.00    Pack years: 2.50    Types: Cigarettes    Start date: 06/25/2000    Quit date: 08/19/2014    Years since quitting: 5.7  . Smokeless tobacco: Never Used  Vaping Use  . Vaping Use: Never used  Substance and Sexual Activity  . Alcohol use: No    Alcohol/week: 0.0 standard drinks  . Drug use: No  . Sexual activity: Not Currently    Partners: Male    Birth control/protection: Pill  Other Topics Concern  . Not on file  Social History Narrative   Patient lives at home alone .   Patient works full time at [employer]   Education college.   Right handed.   Caffeine none   Social Determinants of Health   Financial Resource Strain:   . Difficulty of Paying Living Expenses:   Food Insecurity:   . Worried About Charity fundraiser in the Last Year:   . Arboriculturist in the Last Year:   Transportation Needs:   . Film/video editor (Medical):   Marland Kitchen Lack of Transportation (Non-Medical):   Physical Activity:   . Days of Exercise per Week:   . Minutes of Exercise per Session:   Stress:   . Feeling of Stress :   Social Connections:   . Frequency of Communication with Friends and Family:   . Frequency of Social Gatherings with Friends and Family:   . Attends Religious Services:   . Active Member of Clubs or Organizations:   . Attends Archivist Meetings:   Marland Kitchen Marital Status:   Intimate Partner Violence:   . Fear of Current or Ex-Partner:   . Emotionally Abused:   .  Physically Abused:   . Sexually Abused:     Physical Exam: General:   Alert,  well-nourished, pleasant and cooperative in  NAD Head:  Normocephalic and atraumatic. Eyes:  Sclera clear, no icterus.   Conjunctiva pink. Abdomen:  Soft,nontender, nondistended, normal bowel sounds, no rebound or guarding. No hepatosplenomegaly.   Msk:  Symmetrical. No boney deformities LAD: No inguinal or umbilical LAD Extremities:  No clubbing or edema. Neurologic:  Alert and  oriented x4;  grossly nonfocal Skin:  Intact without significant lesions or rashes. Psych:  Alert and cooperative. Normal mood and affect.   Lab Results: Recent Labs    06/04/20 1421  WBC 6.4  HGB 12.5  HCT 37.9  PLT 261     Vergil Burby L. Tarri Glenn, MD, MPH 06/04/2020, 3:10 PM

## 2020-06-04 NOTE — Patient Instructions (Signed)
Your provider has requested that you go to the basement level for lab work before leaving today. Press "B" on the elevator. The lab is located at the first door on the left as you exit the elevator.   Today we are providing you with samples of IBgard to use.   We have sent the following medications to your pharmacy for you to pick up at your convenience: Dicyclomine   We will contact you about setting up a colonoscopy and a free pre-visit.   If you are not better in a month call us back.   I appreciate the opportunity to care for you. Thornton Park, MD, MPH

## 2020-06-04 NOTE — Progress Notes (Signed)
[place] [phone]   Fax:([phone]) [place] NOTE  Patient Care Team: [name], MD as PCP - General (Family Medicine) Minus Breeding, MD as PCP - Cardiology (Cardiology) Deboraha Sprang, MD as PCP - Electrophysiology (Cardiology) Melvenia Beam, MD as Consulting Physician (Otolaryngology) Lendon Colonel, NP as Nurse Practitioner (Cardiology) Cameron Sprang, MD as Consulting Physician (Neurology)  Hematological/Oncological History # Iron Deficiency without Anemia  1) 04/30/2020: WBC 5.7, hgb 13.2, MCV 90.6, Plt 283. Iron 52, ferritin 11.7, transferrin 395.  2) 06/04/2020: establish care with Dr. Lorenso Courier   #Family History of Thalassemia 1) Patient's grandfather had "thalassemia major" with her mom and brother having "thalassemia minor"   CHIEF COMPLAINTS/PURPOSE OF CONSULTATION:  "Family History of Thalassemia"  HISTORY OF PRESENTING ILLNESS:  Brooke Turner 44 y.o. female with medical history significant for obstructive sleep apnea, anxiety, and posttraumatic stress who presents for evaluation of family history of thalassemia and possible iron deficiency without anemia.  On review of the previous records patient was seen by her primary care provider on 05/01/2019 at which time new labs were drawn.  She was found to have a hemoglobin 13.2, MCV of 90.6, platelets of 283, ferritin of 11.7, and a transferrin of 395.  Additionally the patient reported that she had a family history of thalassemia noting that her grandfather had thalassemia major with her mom and brother having thalassemia minor.  Due to concern for this history of thalassemia and low iron levels the patient was referred to hematology for further evaluation and management.  On exam today Brooke Turner notes that she has had issues with low iron before in the past, and had taken over-the-counter iron but had been causing her issues with her stomach.  She reports  that she was having constipation and GI upset with that medication.  Additionally she notes that she eats a diet that is not particularly rich in red meat and that she prefers chicken and fish with green leafy vegetables.  She notes that she did have heavier menstrual cycles prior to the start of birth control.  She reports that she had passed a lot of clots and that her periods were irregular and would last 7 to 10 days with the first 2 to 3 days being the heaviest.  She notes that she would change her pads approximately 1 every 1-2 hours, though they were not saturated at the times of change.  She notes that she has not had any menstrual cycles in the last 2 years has been continuously taking birth control medications.  On further discussion the patient notes that she does not have any children and does not currently have plans to have children.  She does have history of fibroids which have been discussed for removal with her OB/GYN.  She notes that she is a strong family history for thalassemia and her grandfather, mother, and brother.  She notes that she is a former smoker and quit smoking in 2014.  She notes no other symptoms other than muscle aches.  She denies having any issues with fevers, chills, sweats, nausea, vomiting or diarrhea.  A full 10 point ROS is listed below.  MEDICAL HISTORY:  Past Medical History:  Diagnosis Date  . Anxiety   . Back pain   . Chest pain    a. 09/2014 Echo: EF 60-65%, no rwma, nl valves;  b. 12/2014 Neg ETT.  . Essential hypertension    a. 01/2016 Renal duplex:  no RAS- ? renal cyst (renal u/s 4/17 - no cyst, nl study); b. 02/2016 24 hr BP Monitor: Mean SBP ~ 138 with Max of 198. Highest pressures noted in evening hours following placement of cuff.  . Fibroid uterus   . GERD (gastroesophageal reflux disease)   . Palpitations    a. 09/2014 nl event monitor.    SURGICAL HISTORY: Past Surgical History:  Procedure Laterality Date  . ESOPHAGOGASTRODUODENOSCOPY   01/2020  . WISDOM TOOTH EXTRACTION      SOCIAL HISTORY: Social History   Socioeconomic History  . Marital status: Single    Spouse name: N/A  . Number of children: 0  . Years of education: 73  . Highest education level: Not on file  Occupational History  . Occupation: Research scientist (physical sciences): [employer]  Tobacco Use  . Smoking status: Former Smoker    Packs/day: 0.25    Years: 10.00    Pack years: 2.50    Types: Cigarettes    Start date: 06/25/2000    Quit date: 08/19/2014    Years since quitting: 5.7  . Smokeless tobacco: Never Used  Vaping Use  . Vaping Use: Never used  Substance and Sexual Activity  . Alcohol use: No    Alcohol/week: 0.0 standard drinks  . Drug use: No  . Sexual activity: Not Currently    Partners: Male    Birth control/protection: Pill  Other Topics Concern  . Not on file  Social History Narrative   Patient lives at home alone .   Patient works full time at [employer]   Education college.   Right handed.   Caffeine none   Social Determinants of Health   Financial Resource Strain:   . Difficulty of Paying Living Expenses:   Food Insecurity:   . Worried About Charity fundraiser in the Last Year:   . Arboriculturist in the Last Year:   Transportation Needs:   . Film/video editor (Medical):   Marland Kitchen Lack of Transportation (Non-Medical):   Physical Activity:   . Days of Exercise per Week:   . Minutes of Exercise per Session:   Stress:   . Feeling of Stress :   Social Connections:   . Frequency of Communication with Friends and Family:   . Frequency of Social Gatherings with Friends and Family:   . Attends Religious Services:   . Active Member of Clubs or Organizations:   . Attends Archivist Meetings:   Marland Kitchen Marital Status:   Intimate Partner Violence:   . Fear of Current or Ex-Partner:   . Emotionally Abused:   Marland Kitchen Physically Abused:   . Sexually Abused:     FAMILY HISTORY: Family History  Problem Relation  Age of Onset  . Seizures Mother   . Epilepsy Mother   . Cancer Father        Liver  . Diabetes Father 57  . Hypertension Father   . Heart disease Father 42       CEA, LE [place]  . Alzheimer's disease Maternal Grandmother   . Heart disease Maternal Grandfather   . Colon cancer Neg Hx   . Breast cancer Neg Hx     ALLERGIES:  is allergic to penicillins, tamiflu [oseltamivir phosphate], tinidazole, and diltiazem.  MEDICATIONS:  Current Outpatient Medications  Medication Sig Dispense Refill  . acetaminophen (TYLENOL) 500 MG tablet Take 500 mg by mouth every 6 (six) hours as needed (back pain/ cramps).     Marland Kitchen  atenolol (TENORMIN) 25 MG tablet Take 1 tablet (25 mg total) by mouth 2 (two) times daily. 90 tablet 0  . blood glucose meter kit and supplies Test blood sugar twice daily. Dx code: R73.09 1 each 0  . busPIRone (BUSPAR) 10 MG tablet Take 10 mg by mouth daily.    . cholecalciferol (VITAMIN D3) 25 MCG (1000 UNIT) tablet Take 1,000 Units by mouth daily.    Marland Kitchen dicyclomine (BENTYL) 20 MG tablet Take 1 tablet (20 mg total) by mouth 4 (four) times daily as needed for spasms. 120 tablet 0  . ELDERBERRY PO Take 1 capsule by mouth daily.    . famotidine (PEPCID) 20 MG tablet Take 1 tablet (20 mg total) by mouth 2 (two) times daily. 180 tablet 0  . fluticasone (FLONASE) 50 MCG/ACT nasal spray Place 2 sprays into both nostrils daily. (Patient taking differently: Place 2 sprays into both nostrils as needed (seasonal allergies). ) 16 g 0  . glucose blood (ONETOUCH VERIO) test strip TEST BLOOD SUGAR DAILY 25 each 10  . levalbuterol (XOPENEX HFA) 45 MCG/ACT inhaler Inhale 1-2 puffs every 6 hours if needed- rescue inhaler (Patient taking differently: Inhale 1-2 puffs into the lungs every 6 (six) hours as needed for wheezing. ) 1 Inhaler 12  . levocetirizine (XYZAL) 5 MG tablet levocetirizine 5 mg tablet  TAKE 1 TABLET BY MOUTH AT BEDTIME FOR 2 WEEKS THEN AS NEEDED    . LORazepam (ATIVAN) 0.5 MG tablet  Take 1 tablet (0.5 mg total) by mouth every 6 (six) hours as needed for anxiety. (Patient taking differently: Take 0.25 mg by mouth 3 (three) times daily as needed for anxiety. ) 15 tablet 0  . meclizine (ANTIVERT) 25 MG tablet as needed. prn    . Multiple Vitamins-Minerals (MULTIVITAMIN GUMMIES ADULT PO) Take 2 tablets by mouth daily.     . Norethindrone-Ethinyl Estradiol-Fe Biphas (LO LOESTRIN FE) 1 MG-10 MCG / 10 MCG tablet Take 1 tablet by mouth at bedtime.     [name] DELICA LANCETS 00F MISC USE TO TEST BLOOD SUGAR DAILY 100 each 3  . pantoprazole (PROTONIX) 40 MG tablet Take 1 tablet (40 mg total) by mouth 2 (two) times daily. 60 tablet 1  . Peppermint Oil (IBGARD) 90 MG CPCR Use as directed 16 capsule 0  . SF 5000 PLUS 1.1 % CREA dental cream Take by mouth 2 (two) times daily.    . tranexamic acid (LYSTEDA) 650 MG TABS tablet tranexamic acid 650 mg tablet  TAKE 2 TABLETS BY MOUTH THREE TIMES DAILY    . vitamin B-12 (CYANOCOBALAMIN) 1000 MCG tablet Take 1,000 mcg by mouth daily.      No current facility-administered medications for this visit.    REVIEW OF SYSTEMS:   Constitutional: ( - ) fevers, ( - )  chills , ( - ) night sweats Eyes: ( - ) blurriness of vision, ( - ) double vision, ( - ) watery eyes Ears, nose, mouth, throat, and face: ( - ) mucositis, ( - ) sore throat Respiratory: ( - ) cough, ( - ) dyspnea, ( - ) wheezes Cardiovascular: ( - ) palpitation, ( - ) chest discomfort, ( - ) lower extremity swelling Gastrointestinal:  ( - ) nausea, ( - ) heartburn, ( - ) change in bowel habits Skin: ( - ) abnormal skin rashes Lymphatics: ( - ) new lymphadenopathy, ( - ) easy bruising Neurological: ( - ) numbness, ( - ) tingling, ( - ) new weaknesses Behavioral/Psych: ( - )  mood change, ( - ) new changes  All other systems were reviewed with the patient and are negative.  PHYSICAL EXAMINATION:  Vitals:   06/04/20 1325  BP: (!) 137/84  Pulse: 64  Resp: 17  Temp: 98.1 F (36.7  C)  SpO2: 100%   Filed Weights   06/04/20 1325  Weight: 197 lb 11.2 oz (89.7 kg)    GENERAL: well appearing middle aged [place] American female in NAD  SKIN: skin color, texture, turgor are normal, no rashes or significant lesions EYES: conjunctiva are pink and non-injected, sclera clear LUNGS: clear to auscultation and percussion with normal breathing effort HEART: regular rate & rhythm and no murmurs and no lower extremity edema Musculoskeletal: no cyanosis of digits and no clubbing  PSYCH: alert & oriented x 3, fluent speech NEURO: no focal motor/sensory deficits  LABORATORY DATA:  I have reviewed the data as listed CBC Latest Ref Rng & Units 06/04/2020 01/31/2020 01/11/2020  WBC 4.0 - 10.5 K/uL 6.4 5.0 6.5  Hemoglobin 12.0 - 15.0 g/dL 12.5 12.3 13.4  Hematocrit 36 - 46 % 37.9 36.5 39.7  Platelets 150 - 400 K/uL 261 219 262    CMP Latest Ref Rng & Units 06/04/2020 01/31/2020 01/11/2020  Glucose 70 - 99 mg/dL 92 101(H) 102(H)  BUN 6 - 20 mg/dL 15 13 8   Creatinine 0.44 - 1.00 mg/dL 1.11(H) 1.06(H) 1.16(H)  Sodium 135 - 145 mmol/L 138 140 141  Potassium 3.5 - 5.1 mmol/L 4.5 3.7 3.5  Chloride 98 - 111 mmol/L 105 108 107  CO2 22 - 32 mmol/L 26 24 23   Calcium 8.9 - 10.3 mg/dL 9.1 8.4(L) 8.7(L)  Total Protein 6.5 - 8.1 g/dL 7.0 6.7 -  Total Bilirubin 0.3 - 1.2 mg/dL 1.2 1.3(H) -  Alkaline Phos 38 - 126 U/L 58 43 -  AST 15 - 41 U/L 18 20 -  ALT 0 - 44 U/L 12 16 -    RADIOGRAPHIC STUDIES: No results found.  ASSESSMENT & PLAN Brooke Turner 44 y.o. female with medical history significant for obstructive sleep apnea, anxiety, and posttraumatic stress who presents for evaluation of family history of thalassemia and possible iron deficiency without anemia.  After review of the labs, discussion with the patient, and review the records the patient's findings are most consistent with a strong family history of thalassemia as well as current iron deficiency anemia.  The likely  etiology the patient's iron deficiency anemia is her heavy menstrual cycles in the past.  It is possible that her iron stores never completely recovered and that she runs low on her iron as result of this.  This is supported by the fact she is a ferritin of less than 30 last recorded 11.5.    In terms of her family history of thalassemia she does have a strong history and therefore would merit testing.  I do not think it would be any interventions that we would conduct based on this patient's normal hemoglobin and MCV.  It may be useful in the event that the patient decided to have children, however at the age of 49 it is not clear that this will happen.  As a precaution and for the patient's general knowledge we will order a hemoglobin electrophoresis in order to further evaluate.  Given this there is no indication for a follow-up in our clinic at this time.  # Iron Deficiency without Anemia  --treatment of iron deficiency without anemia is controversial. Recommend that she consider taking PO  iron pills or increasing the iron in her diet --will prescribe iron sulfate 328m PO QOD with a source of vitamin C --no indication for IV iron at this time --will check full iron panel today.  --RTC if patient has new anemia or microcytosis  #Family History of Thalassemia --strong family history of thalassemia, though unclear if Alpha or Beta. Likely Beta based on the description --will order Hgb electrophoresis today --even if test is positive, no intervention required --recommend genetic counseling if positive and patient is planning to become pregnant.  Orders Placed This Encounter  Procedures  . CBC with Differential (Cancer Center Only)    Standing Status:   Future    Number of Occurrences:   1    Standing Expiration Date:   06/04/2021  . Retic Panel    Standing Status:   Future    Number of Occurrences:   1    Standing Expiration Date:   06/04/2021  . C[place] ([place])    Standing  Status:   Future    Number of Occurrences:   1    Standing Expiration Date:   06/04/2021  . Ferritin    Standing Status:   Future    Number of Occurrences:   1    Standing Expiration Date:   06/04/2021  . Iron and TIBC    Standing Status:   Future    Number of Occurrences:   1    Standing Expiration Date:   06/04/2021  . Hgb Fractionation Cascade    Standing Status:   Future    Number of Occurrences:   1    Standing Expiration Date:   06/04/2021    All questions were answered. The patient knows to call the clinic with any problems, questions or concerns.  A total of more than 60 minutes were spent on this encounter and over half of that time was spent on counseling and coordination of care as outlined above.   JLedell Peoples MD Department of Hematology/Oncology [facility] HospitalPhone: [phone]: [phone]: [email or web address]  06/04/2020 7:09 PM

## 2020-06-05 ENCOUNTER — Telehealth: Payer: Self-pay | Admitting: Gastroenterology

## 2020-06-05 NOTE — Telephone Encounter (Signed)
Pt is requesting a call back from a nurse in regards to her dicyclomine

## 2020-06-05 NOTE — Telephone Encounter (Signed)
Left a message for patient to call back. 

## 2020-06-05 NOTE — Telephone Encounter (Signed)
Patient wanted to know if Dicyclomine is the medication for bloating. She states her insurance is not paying as well for it. Discussed the need for the medication and she will try using it.

## 2020-06-08 ENCOUNTER — Other Ambulatory Visit: Payer: Self-pay | Admitting: Physician Assistant

## 2020-06-08 ENCOUNTER — Telehealth: Payer: Self-pay | Admitting: *Deleted

## 2020-06-08 DIAGNOSIS — I10 Essential (primary) hypertension: Secondary | ICD-10-CM | POA: Diagnosis not present

## 2020-06-08 DIAGNOSIS — E785 Hyperlipidemia, unspecified: Secondary | ICD-10-CM | POA: Diagnosis not present

## 2020-06-08 DIAGNOSIS — K900 Celiac disease: Secondary | ICD-10-CM | POA: Diagnosis not present

## 2020-06-08 DIAGNOSIS — M25473 Effusion, unspecified ankle: Secondary | ICD-10-CM | POA: Diagnosis not present

## 2020-06-08 DIAGNOSIS — Z1331 Encounter for screening for depression: Secondary | ICD-10-CM | POA: Diagnosis not present

## 2020-06-08 LAB — HGB FRACTIONATION CASCADE
Hgb A2: 2.2 % (ref 1.8–3.2)
Hgb A: 97.8 % (ref 96.4–98.8)
Hgb F: 0 % (ref 0.0–2.0)
Hgb S: 0 %

## 2020-06-08 LAB — TISSUE TRANSGLUTAMINASE, IGA: (tTG) Ab, IgA: 1 U/mL

## 2020-06-08 NOTE — Telephone Encounter (Signed)
-----   Message from Orson Slick, MD sent at 06/08/2020 12:43 PM EDT ----- Please let Mrs. Brooke Turner know that her bloodwork showed no evidence of thalassemia. Her iron levels are low, she should consider starting the iron pills we called into her pharmacy. She is not currently anemic. We will plan to see her back in 6 months time to reassess her iron levels. ----- Message ----- From: Buel Ream, [facility] [place] Sent: 06/04/2020   2:50 PM EDT To: Orson Slick, MD

## 2020-06-08 NOTE — Telephone Encounter (Signed)
TCT patient regarding lab results from 06/04/20. No answer but was able to leave vm message for pt to return call to [phone] at her earliest convenience

## 2020-06-10 NOTE — Telephone Encounter (Signed)
Attempted call back to patient. No answer but was able to leave vm message for pt to return call at her convenience @ [phone].

## 2020-06-12 ENCOUNTER — Ambulatory Visit: Payer: BC Managed Care – PPO | Admitting: Neurology

## 2020-06-12 ENCOUNTER — Telehealth: Payer: Self-pay

## 2020-06-12 NOTE — Telephone Encounter (Signed)
Left patient a detailed message to call us back and set up a pre-visit and colonoscopy appointment for her screening colon with Dr Tarri Glenn. She was seen 06/04/2020 and we did not have open dates.

## 2020-06-15 ENCOUNTER — Telehealth: Payer: Self-pay | Admitting: Neurology

## 2020-06-15 NOTE — Telephone Encounter (Signed)
Patient missed appointment. Headaches increased, having dizziness but not like vertigo. Feels like she's going to pass out, happens a lot while working out. Has seen cardiologist about it and he suggested checking with neurologist. Added appointment to wait list but patient would like to get in sooner. Please call.

## 2020-06-15 NOTE — Telephone Encounter (Signed)
Left VM requesting call back

## 2020-06-16 NOTE — Telephone Encounter (Signed)
Add to wait list.

## 2020-06-16 NOTE — Telephone Encounter (Signed)
Spoke with pt at length about her reported issues. She missed her appt on 8/6 because she was sick and could not be re-scheduled until 01/2021. She wanted to discuss her 02/2020 MRI results again. Read to her verbatim what Dr Delice Lesch and Dr Tomi Likens reported from previous notes. She states she doesn't have HTN or high blood glucose problems but told her that since she is on antihypertensive meds and has glucose meter supplies that she must have a diagnosis for both of these and that can contribute to the findings on her MRI though, again, both providers said there was nothing on the MRI that would explain her symptoms. Told her that she is on the wait-list to be seen sooner if an appt opens up but that might not be for awhile and that she can certainly f/u with her PCP. She asked if there was anywhere else she could go to see a neurologist sooner and I told her that she could try [facility]. She says she will also look into rehab again.

## 2020-06-24 ENCOUNTER — Other Ambulatory Visit: Payer: Self-pay | Admitting: Pharmacist Clinician (PhC)/ Clinical Pharmacy Specialist

## 2020-06-26 ENCOUNTER — Telehealth: Payer: Self-pay | Admitting: Gastroenterology

## 2020-06-26 ENCOUNTER — Other Ambulatory Visit: Payer: Self-pay

## 2020-06-26 ENCOUNTER — Other Ambulatory Visit: Payer: BC Managed Care – PPO

## 2020-06-26 DIAGNOSIS — K209 Esophagitis, unspecified without bleeding: Secondary | ICD-10-CM

## 2020-06-26 DIAGNOSIS — K219 Gastro-esophageal reflux disease without esophagitis: Secondary | ICD-10-CM

## 2020-06-26 MED ORDER — SUCRALFATE 1 G PO TABS: 1.0000 g | ORAL_TABLET | Freq: Four times a day (QID) | ORAL | 0 refills | Status: DC | PRN

## 2020-06-26 NOTE — Telephone Encounter (Signed)
Lm on vm for patient to return call 

## 2020-06-26 NOTE — Telephone Encounter (Signed)
Spoke with patient, she reports a burning feeling in her throat that has been going on for a few days - "burning air coming up", states her throat feels raw, reports a cough. Pt states that she is taking Famotidine and pantoprazole two times a day. Pt advised that she should be avoiding spicy, acidic, citrus, caffeine and chocolates as this could worsen her symptoms and cause irritation. Pt states that she ate [place] food earlier this week and noticed that her symptoms started around that time, but she states that she has not had this issue in the past. Please advise on any other recommendations, thank you

## 2020-06-26 NOTE — Telephone Encounter (Signed)
Pt states that she has been experiencing burning sensation in her esophagus that radiates to her throat lately, she would like some advise.

## 2020-06-26 NOTE — Telephone Encounter (Signed)
I'm sorry to hear that she is having trouble. If she is already taking famotidine twice daily and pantoprazole twice daily, she could add Carafate 1g QID PRN.  In addition, some people find herbs and other natural remedies helpful in treating heartburn symptoms. Some possible options include:  Chamomile. A cup of chamomile tea may have a soothing effect on the digestive tract. Avoid chamomile if you're allergic to ragweed.  Ginger. The root of the ginger plant is another well-known herbal digestive aid and has been a folk remedy for heartburn for centuries.  Licorice. This remedy has proved effective in several studies. Licorice is said to increase the mucous coating of the esophageal lining, helping it resist the irritating effects of stomach acid. Deglycyrrhizinated licorice, or DGL, is available in pill or liquid form. It is considered safe to take indefinitely.  Other natural remedies. A variety of other remedies have been used over the centuries, but not enough scientific studies have been done to confirm their effectiveness. Catnip, fennel, marshmallow root, and papaya tea have all been said to aid in digestion and act as a buffer to stop heartburn. Some people eat fresh papaya as a digestive aid. Others swear by raw potato juice, three times a day. However, these remedies have not been reviewed for safety or effectiveness by the FDA.   Please arrange an office follow-up appointment with me or an APP to further discuss. Thank you.

## 2020-06-26 NOTE — Telephone Encounter (Signed)
Spoke with patient and advised of recommendations, advised of the option for [place] to send in a RX for Carafate and discussed natural remedies and their benefits. Pt would like to try Carafate, scheduled for follow up on 06/30/20 at 9:30 am. RX sent to pharmacy.

## 2020-06-29 DIAGNOSIS — Z13228 Encounter for screening for other metabolic disorders: Secondary | ICD-10-CM | POA: Diagnosis not present

## 2020-06-29 DIAGNOSIS — E669 Obesity, unspecified: Secondary | ICD-10-CM | POA: Diagnosis not present

## 2020-06-29 DIAGNOSIS — I10 Essential (primary) hypertension: Secondary | ICD-10-CM | POA: Diagnosis not present

## 2020-06-29 DIAGNOSIS — Z87898 Personal history of other specified conditions: Secondary | ICD-10-CM | POA: Diagnosis not present

## 2020-06-29 DIAGNOSIS — Z6833 Body mass index (BMI) 33.0-33.9, adult: Secondary | ICD-10-CM | POA: Diagnosis not present

## 2020-06-29 DIAGNOSIS — E119 Type 2 diabetes mellitus without complications: Secondary | ICD-10-CM | POA: Diagnosis not present

## 2020-06-30 ENCOUNTER — Ambulatory Visit (INDEPENDENT_AMBULATORY_CARE_PROVIDER_SITE_OTHER): Payer: BC Managed Care – PPO | Admitting: Gastroenterology

## 2020-06-30 ENCOUNTER — Encounter: Payer: Self-pay | Admitting: Gastroenterology

## 2020-06-30 VITALS — BP 146/82 | HR 84 | Ht 63.0 in | Wt 194.0 lb

## 2020-06-30 DIAGNOSIS — R140 Abdominal distension (gaseous): Secondary | ICD-10-CM

## 2020-06-30 DIAGNOSIS — R0789 Other chest pain: Secondary | ICD-10-CM | POA: Diagnosis not present

## 2020-06-30 DIAGNOSIS — K2100 Gastro-esophageal reflux disease with esophagitis, without bleeding: Secondary | ICD-10-CM | POA: Diagnosis not present

## 2020-06-30 DIAGNOSIS — K529 Noninfective gastroenteritis and colitis, unspecified: Secondary | ICD-10-CM | POA: Diagnosis not present

## 2020-06-30 NOTE — Progress Notes (Signed)
Referring Provider: [name], MD Primary Care Physician:  [name], MD  Chief complaint:  Burning sensation   IMPRESSION:  Atypical chest pain - likely due to reflux LA Class C reflux esophagitis with breakthrough symptoms on H2blocker BID and PPI BID Bloating without diarrhea or constipation Nausea Post-prandial diarrhea No prior colon cancer screening Enlarged myomatous retroverted uterus seen on CT while in the ED 01/2020  Atypical chest pain with known reflux: Persistent symptoms despite PPI BID and H2Blocker BID. Will use Carafate PRN. Reviewed lifestyle modifications. Consider 24 pH study on medications if breakthrough symptoms persist. She is not a candidate for Bravo given the symptoms that occurred after esophageal biopsies with EGD.   Bloating, nausea, and post-prandial diarrhea: Differential includes altered fundic gland accomodation, functional dyspepsia, gastroparesis, celiac, SIBO.  Reviewed recommendation for a trial of Xifaxan for symptomatic relief.   Colonoscopy recommended for colon cancer screening.    PLAN: Follow-up with PCP for additional reassurance about chest pain Continue famotidine 20 mg BID and pantoprazole 40 mg BID Add Carafate 1g QID PRN Proceed with 24 hour pH study and esophageal manometry Encouraged her to trial the Xifaxan for symptom control  Colonoscopy for colon cancer screening at her convenience Follow-up PRN  Please see the "Patient Instructions" section for addition details about the plan.  HPI: Brooke Turner is a 44 y.o. female who returns in follow-up after calling last week reporting esophageal burning.       01/16/2020 patient seen in clinic by [name], NP for belching, intermittent nausea and burning in her throat with atypical chest pain. We discussed that all of her symptoms were chronic with several ED visits for atypical chest pain extensive cardiac evaluation which was negative (EKG, CT angio chest,  Halter monitor). Apparently chest pain or burning in throat had responded to acid blockers. Told to continue pantoprazole plus Pepcid and have an EGD. Patient also wanted an H. pylori test. It was discussed that she would need to be off of PPI/H2 blockers for several days prior to the study and she did not think she could tolerate that at the time. 01/30/2020 EGD with LA grade C reflux esophagitis with no bleeding. Otherwise normal.Is recommended patient increase her pantoprazole to 40 mg twice daily x8 weeks and continue Famotidine 20 mg twice daily. Biopsies showed chronic inactive gastritis as well as GERD. There was no H pylori.  01/31/2020 patient called our clinic and complained of severe pain to her lower esophagus and unable to eat and swallow liquids without pain. She was sent to the ED. 01/31/2020 ER visit for abdominal pain and lower chest pain and difficulty swallowing. CMP, CBC and lipase normal. Chest x-ray normal. She was consulted by our service while in the ER. She had declined GI cocktail. She had a CT which was negative showing no acute abnormality except for an enlarged myomatous retroverted uterus. It was discussed that she should have a CCK HIDA scan as an outpatient. Is recommended she have hyoscyamine every 6-8 hours. 02/10/20 office visit with [name] when she was feeling better her Carafate which she is using 1-2 times a day because "it so hard to take around my other medicines". She is currently only using her Pantoprazole 40 mg daily and her Pepcid 20 mg twice daily.     05/27/20 office visit reporting bloating without diarrhea or constipation, nausea, post-prandial diarrhea. TTGA and IgA were negative for celiac. She was ask to start dicyclomine and given samples  of IBGard. Treated with 14 days of Xifaxan and recommended to have a colonoscopy.  She did not take the Xifaxan.   Developed a burning sensation last week were temporally triggered by  eating [place] food although was purposefully avoid spicy and tomato-based foods.  Associated dysphonia. Symptoms are similar to those that she experienced prior to her upper endoscopy despite taking pantoprazole 40 mg twice daily, famotidine 20 mg twice daily, Tums.  She feels a sensation of "burning air coming up "with a raw throat.  There is an associated cough. Symptoms are slowly improving before she added Carafate as recommended when she called the office last week. She asked about switching PPI medications if she develops resistance.    No prior colon cancer screening.  Mom with GERD and a hiatal hernia. Father had peptic ulcer disease. Father with cancer of the bile duct. No known family history of colon cancer or polyps. No family history of uterine/endometrial cancer, pancreatic cancer or gastric/stomach cancer.   Past Medical History:  Diagnosis Date  . Anxiety   . Back pain   . Chest pain    a. 09/2014 Echo: EF 60-65%, no rwma, nl valves;  b. 12/2014 Neg ETT.  . Essential hypertension    a. 01/2016 Renal duplex: no RAS- ? renal cyst (renal u/s 4/17 - no cyst, nl study); b. 02/2016 24 hr BP Monitor: Mean SBP ~ 138 with Max of 198. Highest pressures noted in evening hours following placement of cuff.  . Fibroid uterus   . GERD (gastroesophageal reflux disease)   . Palpitations    a. 09/2014 nl event monitor.    Past Surgical History:  Procedure Laterality Date  . ESOPHAGOGASTRODUODENOSCOPY  01/2020  . WISDOM TOOTH EXTRACTION      Current Outpatient Medications  Medication Sig Dispense Refill  . acetaminophen (TYLENOL) 500 MG tablet Take 500 mg by mouth every 6 (six) hours as needed (back pain/ cramps).     Marland Kitchen atenolol (TENORMIN) 25 MG tablet Take 1 tablet by mouth twice daily 90 tablet 3  . blood glucose meter kit and supplies Test blood sugar twice daily. Dx code: R73.09 1 each 0  . busPIRone (BUSPAR) 10 MG tablet Take 10 mg by mouth daily.    . cholecalciferol (VITAMIN D3) 25  MCG (1000 UNIT) tablet Take 1,000 Units by mouth daily.    Marland Kitchen ELDERBERRY PO Take 1 capsule by mouth daily.    . famotidine (PEPCID) 20 MG tablet Take 1 tablet (20 mg total) by mouth 2 (two) times daily. 180 tablet 0  . ferrous sulfate 325 (65 FE) MG tablet Take 1 tablet (325 mg total) by mouth every other day. Please take with a source of Vitamin C 45 tablet 1  . fluticasone (FLONASE) 50 MCG/ACT nasal spray Place 2 sprays into both nostrils daily. (Patient taking differently: Place 2 sprays into both nostrils as needed (seasonal allergies). ) 16 g 0  . glucose blood (ONETOUCH VERIO) test strip TEST BLOOD SUGAR DAILY 25 each 10  . levalbuterol (XOPENEX HFA) 45 MCG/ACT inhaler Inhale 1-2 puffs every 6 hours if needed- rescue inhaler (Patient taking differently: Inhale 1-2 puffs into the lungs every 6 (six) hours as needed for wheezing. ) 1 Inhaler 12  . levocetirizine (XYZAL) 5 MG tablet levocetirizine 5 mg tablet  TAKE 1 TABLET BY MOUTH AT BEDTIME FOR 2 WEEKS THEN AS NEEDED    . LORazepam (ATIVAN) 0.5 MG tablet Take 1 tablet (0.5 mg total) by mouth every 6 (  six) hours as needed for anxiety. (Patient taking differently: Take 0.25 mg by mouth 3 (three) times daily as needed for anxiety. ) 15 tablet 0  . meclizine (ANTIVERT) 25 MG tablet as needed. prn    . Multiple Vitamins-Minerals (MULTIVITAMIN GUMMIES ADULT PO) Take 2 tablets by mouth daily.     . Norethindrone-Ethinyl Estradiol-Fe Biphas (LO LOESTRIN FE) 1 MG-10 MCG / 10 MCG tablet Take 1 tablet by mouth at bedtime.     Brooke Turner DELICA LANCETS 54U MISC USE TO TEST BLOOD SUGAR DAILY 100 each 3  . pantoprazole (PROTONIX) 40 MG tablet Take 1 tablet by mouth twice daily 60 tablet 6  . Peppermint Oil (IBGARD) 90 MG CPCR Use as directed 16 capsule 0  . SF 5000 PLUS 1.1 % CREA dental cream Take by mouth 2 (two) times daily.    . sucralfate (CARAFATE) 1 g tablet Take 1 tablet (1 g total) by mouth 4 (four) times daily as needed. Make a slurry 30 tablet 0   . tranexamic acid (LYSTEDA) 650 MG TABS tablet tranexamic acid 650 mg tablet  TAKE 2 TABLETS BY MOUTH THREE TIMES DAILY    . vitamin B-12 (CYANOCOBALAMIN) 1000 MCG tablet Take 1,000 mcg by mouth daily.     Marland Kitchen dicyclomine (BENTYL) 20 MG tablet Take 1 tablet (20 mg total) by mouth 4 (four) times daily as needed for spasms. (Patient not taking: Reported on 06/30/2020) 120 tablet 0   No current facility-administered medications for this visit.    Allergies as of 06/30/2020 - Review Complete 06/30/2020  Allergen Reaction Noted  . Penicillins Hives 01/04/2012  . Tamiflu [oseltamivir phosphate] Nausea And Vomiting 11/18/2013  . Tinidazole Other (See Comments) 09/14/2015  . Diltiazem Other (See Comments) 04/12/2016    Family History  Problem Relation Age of Onset  . Seizures Mother   . Epilepsy Mother   . Cancer Father        Liver  . Diabetes Father 72  . Hypertension Father   . Heart disease Father 59       CEA, LE [place]  . Alzheimer's disease Maternal Grandmother   . Heart disease Maternal Grandfather   . Colon cancer Neg Hx   . Breast cancer Neg Hx     Social History   Socioeconomic History  . Marital status: Single    Spouse name: N/A  . Number of children: 0  . Years of education: 62  . Highest education level: Not on file  Occupational History  . Occupation: Research scientist (physical sciences): [employer]  Tobacco Use  . Smoking status: Former Smoker    Packs/day: 0.25    Years: 10.00    Pack years: 2.50    Types: Cigarettes    Start date: 06/25/2000    Quit date: 08/19/2014    Years since quitting: 5.8  . Smokeless tobacco: Never Used  Vaping Use  . Vaping Use: Never used  Substance and Sexual Activity  . Alcohol use: No    Alcohol/week: 0.0 standard drinks  . Drug use: No  . Sexual activity: Not Currently    Partners: Male    Birth control/protection: Pill  Other Topics Concern  . Not on file  Social History Narrative   Patient lives at home alone  .   Patient works full time at [employer]   Education college.   Right handed.   Caffeine none   Social Determinants of Health   Financial Resource Strain:   . Difficulty  of Paying Living Expenses: Not on file  Food Insecurity:   . Worried About Charity fundraiser in the Last Year: Not on file  . Ran Out of Food in the Last Year: Not on file  Transportation Needs:   . Lack of Transportation (Medical): Not on file  . Lack of Transportation (Non-Medical): Not on file  Physical Activity:   . Days of Exercise per Week: Not on file  . Minutes of Exercise per Session: Not on file  Stress:   . Feeling of Stress : Not on file  Social Connections:   . Frequency of Communication with Friends and Family: Not on file  . Frequency of Social Gatherings with Friends and Family: Not on file  . Attends Religious Services: Not on file  . Active Member of Clubs or Organizations: Not on file  . Attends Archivist Meetings: Not on file  . Marital Status: Not on file  Intimate Partner Violence:   . Fear of Current or Ex-Partner: Not on file  . Emotionally Abused: Not on file  . Physically Abused: Not on file  . Sexually Abused: Not on file    Physical Exam: General:   Alert,  well-nourished, pleasant and cooperative in NAD Head:  Normocephalic and atraumatic. Eyes:  Sclera clear, no icterus.   Conjunctiva pink. Ears:  Normal auditory acuity. Nose:  No deformity, discharge,  or lesions. Mouth:  No deformity or lesions.   Neck:  Supple; no masses or thyromegaly. Lungs:  Clear throughout to auscultation.   No wheezes. Heart:  Regular rate and rhythm; no murmurs. Abdomen:  Soft, nontender, nondistended, normal bowel sounds, no rebound or guarding. No hepatosplenomegaly.   Rectal:  Deferred  Msk:  Symmetrical. No boney deformities LAD: No inguinal or umbilical LAD Extremities:  No clubbing or edema. Neurologic:  Alert and  oriented x4;  grossly nonfocal Skin:  Intact without  significant lesions or rashes. Psych:  Alert and cooperative. Normal mood and affect.    Elyas Villamor L. Tarri Glenn, MD, MPH 06/30/2020, 9:54 AM

## 2020-06-30 NOTE — Patient Instructions (Addendum)
If you are age 45 or older, your body mass index should be between 23-30. Your Body mass index is 34.37 kg/m. If this is out of the aforementioned range listed, please consider follow up with your Primary Care Provider.  If you are age 6 or younger, your body mass index should be between 19-25. Your Body mass index is 34.37 kg/m. If this is out of the aformentioned range listed, please consider follow up with your Primary Care Provider.    Due to recent COVID-19 restrictions implemented by our local and state authorities and in an effort to keep both patients and staff as safe as possible, our hospital system now requires COVID-19 testing prior to any scheduled hospital procedure. Please go to [place] [place] [id number] on 09-19-2020 at  1030am. This is a drive up testing site, you will not need to exit your vehicle.  You will not be billed at the time of testing but may receive a bill later depending on your insurance. The approximate cost of the test is $100. You must agree to quarantine from the time of your testing until the procedure date on 09-23-2020 . This should include staying at home with ONLY the people you live with. Avoid take-out, grocery store shopping or leaving the house for any non-emergent reason. Failure to have your COVID-19 test done on the date and time you have been scheduled will result in cancellation of procedure. Please call our office at [phone] if you have any questions.    You have been scheduled for an Esophageal Manometry and 24 Hour pH study at [facility] on 09-23-2020 at 1030 am. Please arrive 30 minutes prior to your procedure for registration. You will need to go to outpatient registration (1st floor of the hospital) first. Make certain to bring your insurance cards as well as a complete list of medications.  Please remember the following:  1) Do not take any muscle relaxants, xanax (alprazolam) or ativan for 1 day prior to  your test as well as the day of the test.  2) Nothing to eat or drink after 12:00 midnight on the night before your test.  3) Hold all diabetic medications/insulin the morning of the test. You may eat and take your medications after the test.  4) For 7 days prior to your test, do not take: Reglan, Tagamet, Zantac, Phenergan, Axid or Pepcid.  5) You MAY use an antacid such as Rolaids or Tums up to 12 hours prior to your test.  ----------------------------------------------------------------------------------------- ABOUT ESOPHAGEAL MANOMETRY Esophageal manometry (muh-NOM-uh-tree) is a test that gauges how well your esophagus works. Your esophagus is the long, muscular tube that connects your throat to your stomach. Esophageal manometry measures the rhythmic muscle contractions (peristalsis) that occur in your esophagus when you swallow. Esophageal manometry also measures the coordination and force exerted by the muscles of your esophagus.  During esophageal manometry, a thin, flexible tube (catheter) that contains sensors is passed through your nose, down your esophagus and into your stomach. Esophageal manometry can be helpful in diagnosing some mostly uncommon disorders that affect your esophagus.  Why it's done Esophageal manometry is used to evaluate the movement (motility) of food through the esophagus and into the stomach. The test measures how well the circular bands of muscle (sphincters) at the top and bottom of your esophagus open and close, as well as the pressure, strength and pattern of the wave of esophageal muscle contractions that moves food along.  What  you can expect Esophageal manometry is an outpatient procedure done without sedation. Most people tolerate it well. You may be asked to change into a hospital gown before the test starts.  During esophageal manometry  . While you are sitting up, a member of your health care team sprays your throat with a numbing medication or puts  numbing gel in your nose or both.  . A catheter is guided through your nose into your esophagus. The catheter may be sheathed in a water-filled sleeve. It doesn't interfere with your breathing. However, your eyes may water, and you may gag. You may have a slight nosebleed from irritation.  . After the catheter is in place, you may be asked to lie on your back on an exam table, or you may be asked to remain seated.  . You then swallow small sips of water. As you do, a computer connected to the catheter records the pressure, strength and pattern of your esophageal muscle contractions.  . During the test, you'll be asked to breathe slowly and smoothly, remain as still as possible, and swallow only when you're asked to do so.  . A member of your health care team may move the catheter down into your stomach while the catheter continues its measurements.  . The catheter then is slowly withdrawn.  This test typically takes 30-45 minutes to complete.  ---------------------------------------------------------------------------------------------- ABOUT 24 HOUR PH PROBE An esophageal pH test measures and records the pH in your esophagus to determine if you have gastroesophageal reflux disease (GERD). The test can also be done to determine the effectiveness of medications or surgical treatment for GERD. What is esophageal reflux? Esophageal reflux is a condition in which stomach acid refluxes or moves back into the esophagus (the "food pipe" leading from the mouth to the stomach). How does the esophageal pH test work? A thin, small tube with an acid sensing device on the tip is gently passed through your nose, down the esophagus ("food tube"), and positioned about 2 inches above the lower esophageal sphincter. The tube is secured to the side of your face with clear tape. The end of the tube exiting from your nose is attached to a portable recorder that is worn on your belt or over your shoulder. The recorder has  several buttons on it that you will press to mark certain events. A nurse will review the monitoring instructions with you. Once the test has begun, what do I need to know and do? Marland Kitchen Activity: Follow your usual daily routine. Do not reduce or change your activities during the monitoring period. Doing so can make the monitoring results less useful.  . Note: do not take a tub bath or shower; the equipment can't get wet.  . Eating: Eat your regular meals at the usual times. If you do not eat during the monitoring period, your stomach will not produce acid as usual, and the test results will not be accurate. Eat at least 2 meals a day. Eat foods that tend to increase your symptoms (without making yourself miserable). Avoid snacking. Do not suck on hard candy or lozenges and do not chew gum during the monitoring period.  . Lying down: Remain upright throughout the day. Do not lie down until you go to bed (unless napping or lying down during the day is part of your daily routine).  . Medications: Continue to follow your doctor's advice regarding medications to avoid during the monitoring period.  . Recording symptoms: Press the appropriate button  on your recorder when symptoms occur (as discussed with the nurse).  . Recording events: Record the time you start and stop eating and drinking (anything other than plain water). Record the time you lie down (even if just resting) and when you get back up. The nurse will explain this.  . Unusual symptoms or side effects. If you think you may be experiencing any unusual symptoms or side effects, call your doctor.  You will return the next day to have the tube removed. The information on the recorder will be downloaded to a computer and the results will be analyzed.  After completion of the study Resume your normal diet and medications. Lozenges or hard candy may help ease any sore throat caused by the tube.   It will take at least 2 weeks to receive the results of  this test from your physician.    It was a pleasure to see you today!  Dr. Tarri Glenn

## 2020-07-01 ENCOUNTER — Other Ambulatory Visit: Payer: Self-pay

## 2020-07-01 DIAGNOSIS — R0789 Other chest pain: Secondary | ICD-10-CM

## 2020-07-01 DIAGNOSIS — R140 Abdominal distension (gaseous): Secondary | ICD-10-CM

## 2020-07-01 DIAGNOSIS — K2100 Gastro-esophageal reflux disease with esophagitis, without bleeding: Secondary | ICD-10-CM

## 2020-07-06 ENCOUNTER — Ambulatory Visit: Payer: BC Managed Care – PPO | Admitting: Neurology

## 2020-07-10 DIAGNOSIS — F418 Other specified anxiety disorders: Secondary | ICD-10-CM | POA: Diagnosis not present

## 2020-07-10 DIAGNOSIS — I10 Essential (primary) hypertension: Secondary | ICD-10-CM | POA: Diagnosis not present

## 2020-07-10 DIAGNOSIS — R002 Palpitations: Secondary | ICD-10-CM | POA: Diagnosis not present

## 2020-07-10 DIAGNOSIS — R0789 Other chest pain: Secondary | ICD-10-CM | POA: Diagnosis not present

## 2020-07-12 DIAGNOSIS — R002 Palpitations: Secondary | ICD-10-CM | POA: Diagnosis not present

## 2020-07-16 DIAGNOSIS — Z713 Dietary counseling and surveillance: Secondary | ICD-10-CM | POA: Diagnosis not present

## 2020-07-16 DIAGNOSIS — J301 Allergic rhinitis due to pollen: Secondary | ICD-10-CM | POA: Diagnosis not present

## 2020-07-16 DIAGNOSIS — Z6833 Body mass index (BMI) 33.0-33.9, adult: Secondary | ICD-10-CM | POA: Diagnosis not present

## 2020-07-16 DIAGNOSIS — Z9109 Other allergy status, other than to drugs and biological substances: Secondary | ICD-10-CM | POA: Diagnosis not present

## 2020-07-16 DIAGNOSIS — E6609 Other obesity due to excess calories: Secondary | ICD-10-CM | POA: Diagnosis not present

## 2020-07-16 DIAGNOSIS — K2100 Gastro-esophageal reflux disease with esophagitis, without bleeding: Secondary | ICD-10-CM | POA: Diagnosis not present

## 2020-07-16 DIAGNOSIS — I10 Essential (primary) hypertension: Secondary | ICD-10-CM | POA: Diagnosis not present

## 2020-07-16 DIAGNOSIS — Z87891 Personal history of nicotine dependence: Secondary | ICD-10-CM | POA: Diagnosis not present

## 2020-07-20 ENCOUNTER — Telehealth: Payer: Self-pay | Admitting: Neurology

## 2020-07-20 DIAGNOSIS — F411 Generalized anxiety disorder: Secondary | ICD-10-CM | POA: Diagnosis not present

## 2020-07-20 DIAGNOSIS — R0981 Nasal congestion: Secondary | ICD-10-CM | POA: Diagnosis not present

## 2020-07-20 DIAGNOSIS — I10 Essential (primary) hypertension: Secondary | ICD-10-CM | POA: Diagnosis not present

## 2020-07-20 DIAGNOSIS — R519 Headache, unspecified: Secondary | ICD-10-CM | POA: Diagnosis not present

## 2020-07-20 NOTE — Telephone Encounter (Signed)
Patient states she's having trouble sleeping, she will wake up feeling like she can't breath, with her heart pounding, feels like she's trying to catch her breath, and she's very disoriented. She will have to go to the bathroom and then it takes her a while to be able to settle back down and go back to sleep. She states it's happening a few times a week now, she is documenting the occurrences. She has been told that she snores. She is also still getting headaches. She is requesting a referral for a sleep study. Please call.

## 2020-07-21 NOTE — Telephone Encounter (Signed)
Can she ask her cardiologist or PCP about ordering the sleep study? I haven't seen her for these symptoms so insurance will not approve it without a visit. She was late for her appointment and is on the waitlist.

## 2020-07-21 NOTE — Telephone Encounter (Signed)
She spoke to PCP yesterday, BP was high and they are watching that, and the PCP ordered the sleep study

## 2020-07-24 DIAGNOSIS — H1130 Conjunctival hemorrhage, unspecified eye: Secondary | ICD-10-CM | POA: Diagnosis not present

## 2020-07-24 DIAGNOSIS — I10 Essential (primary) hypertension: Secondary | ICD-10-CM | POA: Diagnosis not present

## 2020-07-29 ENCOUNTER — Telehealth: Payer: Self-pay | Admitting: Gastroenterology

## 2020-07-29 NOTE — Telephone Encounter (Signed)
Left message for pt to call back  °

## 2020-07-31 NOTE — Telephone Encounter (Signed)
Spoke with pts and her questions were answered.

## 2020-08-04 ENCOUNTER — Telehealth: Payer: Self-pay | Admitting: Gastroenterology

## 2020-08-04 NOTE — Telephone Encounter (Signed)
Left message for pt to call back  °

## 2020-08-05 NOTE — Telephone Encounter (Signed)
Pt states she is continuing to have burning in her chest and throat. Reports she is taking protonix bid but states she has only been taking the carafate daily. Discussed with her she should be taking it before meals and at bedtime. Pt states she will start this and try it, she knows to call back if no improvement.

## 2020-08-07 ENCOUNTER — Telehealth: Payer: Self-pay | Admitting: Gastroenterology

## 2020-08-07 DIAGNOSIS — Z03818 Encounter for observation for suspected exposure to other biological agents ruled out: Secondary | ICD-10-CM | POA: Diagnosis not present

## 2020-08-07 NOTE — Telephone Encounter (Signed)
Patient needs to refill carafate. Please send it to [facility].

## 2020-08-10 ENCOUNTER — Other Ambulatory Visit: Payer: Self-pay

## 2020-08-10 DIAGNOSIS — K219 Gastro-esophageal reflux disease without esophagitis: Secondary | ICD-10-CM

## 2020-08-10 DIAGNOSIS — K209 Esophagitis, unspecified without bleeding: Secondary | ICD-10-CM

## 2020-08-10 MED ORDER — SUCRALFATE 1 G PO TABS: 1.0000 g | ORAL_TABLET | Freq: Four times a day (QID) | ORAL | 0 refills | Status: DC | PRN

## 2020-08-10 NOTE — Telephone Encounter (Signed)
Refilled as previously written

## 2020-08-17 DIAGNOSIS — Z03818 Encounter for observation for suspected exposure to other biological agents ruled out: Secondary | ICD-10-CM | POA: Diagnosis not present

## 2020-08-19 DIAGNOSIS — R5383 Other fatigue: Secondary | ICD-10-CM | POA: Diagnosis not present

## 2020-08-19 DIAGNOSIS — M791 Myalgia, unspecified site: Secondary | ICD-10-CM | POA: Diagnosis not present

## 2020-08-19 DIAGNOSIS — I10 Essential (primary) hypertension: Secondary | ICD-10-CM | POA: Diagnosis not present

## 2020-08-19 DIAGNOSIS — R21 Rash and other nonspecific skin eruption: Secondary | ICD-10-CM | POA: Diagnosis not present

## 2020-08-19 DIAGNOSIS — E559 Vitamin D deficiency, unspecified: Secondary | ICD-10-CM | POA: Diagnosis not present

## 2020-08-21 ENCOUNTER — Telehealth: Payer: Self-pay

## 2020-08-21 DIAGNOSIS — M25532 Pain in left wrist: Secondary | ICD-10-CM | POA: Diagnosis not present

## 2020-08-21 DIAGNOSIS — M25531 Pain in right wrist: Secondary | ICD-10-CM | POA: Diagnosis not present

## 2020-08-21 NOTE — Telephone Encounter (Signed)
Patient walked in office complaining of episodes of fast heart beat.Stated she has been having episodes over the past several months.Notices episodes when she has a lot of gas.Stated she would like to be seen.Appointment scheduled with Coletta Memos NP 10/19 at 11:15 am.

## 2020-08-24 NOTE — Progress Notes (Signed)
Cardiology Clinic Note   Patient Name: Brooke Turner Date of Encounter: 08/25/2020  Primary Care Provider:  [name], MD Primary Cardiologist:  Minus Breeding, MD  Patient Profile    Brooke Turner 44 year old female presents to the clinic for  evaluation of her rapid heartbeat.  Past Medical History    Past Medical History:  Diagnosis Date  . Anxiety   . Back pain   . Chest pain    a. 09/2014 Echo: EF 60-65%, no rwma, nl valves;  b. 12/2014 Neg ETT.  . Essential hypertension    a. 01/2016 Renal duplex: no RAS- ? renal cyst (renal u/s 4/17 - no cyst, nl study); b. 02/2016 24 hr BP Monitor: Mean SBP ~ 138 with Max of 198. Highest pressures noted in evening hours following placement of cuff.  . Fibroid uterus   . GERD (gastroesophageal reflux disease)   . Palpitations    a. 09/2014 nl event monitor.   Past Surgical History:  Procedure Laterality Date  . ESOPHAGOGASTRODUODENOSCOPY  01/2020  . WISDOM TOOTH EXTRACTION      Allergies  Allergies  Allergen Reactions  . Penicillins Hives    Has patient had a PCN reaction causing immediate rash, facial/tongue/throat swelling, SOB or lightheadedness with hypotension:Yes Has patient had a PCN reaction causing severe rash involving mucus membranes or skin necrosis:unsure Has patient had a PCN reaction that required hospitalization: Yes Has patient had a PCN reaction occurring within the last 10 years: No If all of the above answers are "NO", then may proceed with Cephalosporin use.     . Tamiflu [Oseltamivir Phosphate] Nausea And Vomiting    Excessive vomiting  . Tinidazole Other (See Comments)    Caused severe abd pain/type of colitis   . Diltiazem Other (See Comments)    Dizzy, felt like she was going to pass out Blood pressure went up per patient     History of Present Illness    [name] has a PMH of anxiety, back pain, chest pain (echocardiogram 11/15 EF 60-65%, 2/16 negative  ETT.),  Essential hypertension, GERD, and palpitations.  She was last seen by Dr. Percival Spanish on 03/26/2020.  During that time she was seen for follow-up evaluation of her chest pain.  She did not tolerate 60 mg of Imdur.  Her dose was reduced to 15 mg which she tolerated much better.  She was also started on pantoprazole which helped improved her chest discomfort.  She denied shortness of breath, PND, orthopnea, palpitations, presyncope, syncope, lower extremity edema, and weight gain.  She walked in the clinic 08/21/2020 and indicated she had been having episodes of fast heartbeat for several months.  She noticed increased episodes when she had a lot of gas.  She presents to the clinic today for evaluation and states over the past several weeks she has noticed increasing episodes of irregular heart rate.  She states that these episodes come on just prior to belching and subside.  She indicates that she also has a family history of elevated lipoprotein a and was tested.  She is having the results faxed to our office.  She denies use of caffeine and triggers that may cause palpitations.  She also notices intermittent periods of lightheadedness not associated with palpitations.  We reviewed triggers for palpitations.  I will order a 14-day ZIO monitor and fasting lipid panel.  We will have her follow-up in 8 weeks.  Today she denies  shortness of breath, lower extremity edema, fatigue,  palpitations, melena, hematuria, hemoptysis, diaphoresis, weakness, presyncope, syncope, orthopnea, and PND.   Home Medications    Prior to Admission medications   Medication Sig Start Date End Date Taking? Authorizing Provider  acetaminophen (TYLENOL) 500 MG tablet Take 500 mg by mouth every 6 (six) hours as needed (back pain/ cramps).     [provider]  atenolol (TENORMIN) 25 MG tablet Take 1 tablet by mouth twice daily 06/24/20   Minus Breeding, MD  blood glucose meter kit and supplies Test blood sugar twice  daily. Dx code: R73.09 04/25/19   Maryruth Hancock, MD  busPIRone (BUSPAR) 10 MG tablet Take 10 mg by mouth daily.    [provider]  cholecalciferol (VITAMIN D3) 25 MCG (1000 UNIT) tablet Take 1,000 Units by mouth daily.    [provider]  dicyclomine (BENTYL) 20 MG tablet Take 1 tablet (20 mg total) by mouth 4 (four) times daily as needed for spasms. Patient not taking: Reported on 06/30/2020 06/04/20   Thornton Park, MD  ELDERBERRY PO Take 1 capsule by mouth daily.    [provider]  famotidine (PEPCID) 20 MG tablet Take 1 tablet (20 mg total) by mouth 2 (two) times daily. 11/20/18   [name], MD  ferrous sulfate 325 (65 FE) MG tablet Take 1 tablet (325 mg total) by mouth every other day. Please take with a source of Vitamin C 06/04/20   Orson Slick, MD  fluticasone [facility]) 50 MCG/ACT nasal spray Place 2 sprays into both nostrils daily. Patient taking differently: Place 2 sprays into both nostrils as needed (seasonal allergies).  10/10/17   Tenna Delaine D, PA-C  glucose blood (ONETOUCH VERIO) test strip TEST BLOOD SUGAR DAILY 09/02/19   Forrest Moron, MD  levalbuterol [facility] HFA) 45 MCG/ACT inhaler Inhale 1-2 puffs every 6 hours if needed- rescue inhaler Patient taking differently: Inhale 1-2 puffs into the lungs every 6 (six) hours as needed for wheezing.  05/29/19   Deneise Lever, MD  levocetirizine (XYZAL) 5 MG tablet levocetirizine 5 mg tablet  TAKE 1 TABLET BY MOUTH AT BEDTIME FOR 2 WEEKS THEN AS NEEDED    [provider]  LORazepam (ATIVAN) 0.5 MG tablet Take 1 tablet (0.5 mg total) by mouth every 6 (six) hours as needed for anxiety. Patient taking differently: Take 0.25 mg by mouth 3 (three) times daily as needed for anxiety.  03/19/19   Forrest Moron, MD  meclizine (ANTIVERT) 25 MG tablet as needed. prn    [provider]  Multiple Vitamins-Minerals (MULTIVITAMIN GUMMIES ADULT PO) Take 2 tablets by mouth daily.      [provider]  Norethindrone-Ethinyl Estradiol-Fe Biphas (LO LOESTRIN FE) 1 MG-10 MCG / 10 MCG tablet Take 1 tablet by mouth at bedtime.     [provider]  [facility] DELICA LANCETS 65H MISC USE TO TEST BLOOD SUGAR DAILY 11/16/18   [name], MD  pantoprazole (PROTONIX) 40 MG tablet Take 1 tablet by mouth twice daily 06/09/20   Levin Erp, PA  Peppermint Oil (IBGARD) 90 MG CPCR Use as directed 06/04/20   Thornton Park, MD  SF 5000 PLUS 1.1 % CREA dental cream Take by mouth 2 (two) times daily. 05/29/20   [provider]  sucralfate (CARAFATE) 1 g tablet Take 1 tablet (1 g total) by mouth 4 (four) times daily as needed. Make a slurry 08/10/20   Thornton Park, MD  tranexamic acid (LYSTEDA) 650 MG TABS tablet tranexamic  acid 650 mg tablet  TAKE 2 TABLETS BY MOUTH THREE TIMES DAILY    [provider]  vitamin B-12 (CYANOCOBALAMIN) 1000 MCG tablet Take 1,000 mcg by mouth daily.     [provider]    Family History    Family History  Problem Relation Age of Onset  . Seizures Mother   . Epilepsy Mother   . Cancer Father        Liver  . Diabetes Father 54  . Hypertension Father   . Heart disease Father 6       CEA, LE Stenting  . Alzheimer's disease Maternal Grandmother   . Heart disease Maternal Grandfather   . Colon cancer Neg Hx   . Breast cancer Neg Hx    She indicated that her mother is alive. She indicated that her father is deceased. She indicated that her sister is alive. She indicated that her brother is alive. She indicated that her maternal grandmother is deceased. She indicated that her maternal grandfather is deceased. She indicated that her paternal grandmother is deceased. She indicated that her paternal grandfather is deceased. She indicated that the status of her neg hx is unknown.  Social History    Social History   Socioeconomic History  . Marital status: Single    Spouse name: N/A  . Number of children:  0  . Years of education: 35  . Highest education level: Not on file  Occupational History  . Occupation: Research scientist (physical sciences): [employer]  Tobacco Use  . Smoking status: Former Smoker    Packs/day: 0.25    Years: 10.00    Pack years: 2.50    Types: Cigarettes    Start date: 06/25/2000    Quit date: 08/19/2014    Years since quitting: 6.0  . Smokeless tobacco: Never Used  Vaping Use  . Vaping Use: Never used  Substance and Sexual Activity  . Alcohol use: No    Alcohol/week: 0.0 standard drinks  . Drug use: No  . Sexual activity: Not Currently    Partners: Male    Birth control/protection: Pill  Other Topics Concern  . Not on file  Social History Narrative   Patient lives at home alone .   Patient works full time at [employer]   Education college.   Right handed.   Caffeine none   Social Determinants of Health   Financial Resource Strain:   . Difficulty of Paying Living Expenses: Not on file  Food Insecurity:   . Worried About Charity fundraiser in the Last Year: Not on file  . Ran Out of Food in the Last Year: Not on file  Transportation Needs:   . Lack of Transportation (Medical): Not on file  . Lack of Transportation (Non-Medical): Not on file  Physical Activity:   . Days of Exercise per Week: Not on file  . Minutes of Exercise per Session: Not on file  Stress:   . Feeling of Stress : Not on file  Social Connections:   . Frequency of Communication with Friends and Family: Not on file  . Frequency of Social Gatherings with Friends and Family: Not on file  . Attends Religious Services: Not on file  . Active Member of Clubs or Organizations: Not on file  . Attends Archivist Meetings: Not on file  . Marital Status: Not on file  Intimate Partner Violence:   . Fear of Current or Ex-Partner: Not on file  . Emotionally  Abused: Not on file  . Physically Abused: Not on file  . Sexually Abused: Not on file     Review of Systems      General:  No chills, fever, night sweats or weight changes.  Cardiovascular:  No chest pain, dyspnea on exertion, edema, orthopnea, palpitations, paroxysmal nocturnal dyspnea. Dermatological: No rash, lesions/masses Respiratory: No cough, dyspnea Urologic: No hematuria, dysuria Abdominal:   No nausea, vomiting, diarrhea, bright red blood per rectum, melena, or hematemesis Neurologic:  No visual changes, wkns, changes in mental status. All other systems reviewed and are otherwise negative except as noted above.  Physical Exam    VS:  BP (!) 158/94   Pulse 89   Ht 5' 4"  (1.626 m)   Wt 197 lb (89.4 kg)   BMI 33.81 kg/m  , BMI Body mass index is 33.81 kg/m. GEN: Well nourished, well developed, in no acute distress. HEENT: normal. Neck: Supple, no JVD, carotid bruits, or masses. Cardiac: RRR, no murmurs, rubs, or gallops. No clubbing, cyanosis, edema.  Radials/DP/PT 2+ and equal bilaterally.  Respiratory:  Respirations regular and unlabored, clear to auscultation bilaterally. GI: Soft, nontender, nondistended, BS + x 4. MS: no deformity or atrophy. Skin: warm and dry, no rash. Neuro:  Strength and sensation are intact. Psych: Normal affect.  Accessory Clinical Findings    Recent Labs: 11/13/2019: TSH 0.74 06/04/2020: ALT 12; BUN 15; Creatinine 1.11; Hemoglobin 12.5; Platelet Count 261; Potassium 4.5; Sodium 138   Recent Lipid Panel    Component Value Date/Time   CHOL 172 10/25/2016 1850   TRIG 87 10/25/2016 1850   HDL 49 10/25/2016 1850   CHOLHDL 3.5 10/25/2016 1850   LDLCALC 106 (H) 10/25/2016 1850   LDLDIRECT 101 (H) 10/25/2016 1850    ECG personally reviewed by me today-normal sinus rhythm possible left atrial enlargement 89 bpm no ST or T wave deviation  Echocardiogram 04/10/2019 IMPRESSIONS    1. The left ventricle has normal systolic function with an ejection  fraction of 60-65%. The cavity size was normal. Left ventricular diastolic  Doppler parameters are  consistent with impaired relaxation. No evidence of  left ventricular regional wall  motion abnormalities.  2. The right ventricle has normal systolic function. The cavity was  normal. There is no increase in right ventricular wall thickness.  3. No evidence of mitral valve stenosis. No significant regurgitation.  4. The aortic valve is tricuspid. No stenosis of the aortic valve.  5. The aortic root is normal in size and structure.  6. Normal IVC size. No complete TR doppler jet so unable to estimate PA  systolic pressure.  Cardiac event monitor 02/28/2019 Normal sinus rhythm Rare ectopy No significant arrhythmias Complaints including chest pain and fluttering did not correlate with arrhythmia Symptoms occurred with NSR   Assessment & Plan   1.  Palpitations-fairly frequent episodes of palpitations/ rapid heart rate noticed prior to belching.Jama Flavors into the office on 08/21/2020 and indicated she had been having increased episodes of rapid heartbeat.  TSH drawn at PCP and was normal. Continue atenolol Heart healthy low-sodium diet-salty 6 given Increase physical activity as tolerated Avoid triggers caffeine, chocolate, EtOH etc. Order 14-day Zio patch  Chest wall  pain-reproducible with deep palpation.    Previously placed on 60 mg of Imdur which she did not tolerate however, dose reduced to 15 which she has tolerated well. Continue Imdur, Protonix Heart healthy low-sodium diet Increase physical activity  Hyperlipidemia-family history of elevated lipoprotein a.  She indicates  that she was tested for this and will have the lab work faxed to our office.  We have not drawn a lipid panel since 2017. Repeat fasting lipid panel  Disposition: Follow-up with Dr. Percival Spanish in  8 weeks.  Jossie Ng. Patty Lopezgarcia NP-C    08/25/2020, 12:07 PM [address] Office [phone] Fax [phone]  Notice: This dictation was prepared with  Dragon dictation along with smaller phrase technology. Any transcriptional errors that result from this process are unintentional and may not be corrected upon review.

## 2020-08-25 ENCOUNTER — Other Ambulatory Visit: Payer: Self-pay

## 2020-08-25 ENCOUNTER — Ambulatory Visit (INDEPENDENT_AMBULATORY_CARE_PROVIDER_SITE_OTHER): Payer: BC Managed Care – PPO | Admitting: General Practice

## 2020-08-25 ENCOUNTER — Other Ambulatory Visit: Payer: BC Managed Care – PPO

## 2020-08-25 ENCOUNTER — Encounter: Payer: Self-pay | Admitting: General Practice

## 2020-08-25 VITALS — BP 158/94 | HR 89 | Ht 64.0 in | Wt 197.0 lb

## 2020-08-25 DIAGNOSIS — R0789 Other chest pain: Secondary | ICD-10-CM | POA: Diagnosis not present

## 2020-08-25 DIAGNOSIS — Z79899 Other long term (current) drug therapy: Secondary | ICD-10-CM | POA: Diagnosis not present

## 2020-08-25 DIAGNOSIS — E785 Hyperlipidemia, unspecified: Secondary | ICD-10-CM

## 2020-08-25 DIAGNOSIS — R002 Palpitations: Secondary | ICD-10-CM | POA: Diagnosis not present

## 2020-08-25 NOTE — Patient Instructions (Signed)
Medication Instructions:  The current medical regimen is effective;  continue present plan and medications as directed. Please refer to the Current Medication list given to you today.; *If you need a refill on your cardiac medications before your next appointment, please call your pharmacy*  Lab Work: Lipid panel today If you have labs (blood work) drawn today and your tests are completely normal, you will receive your results only by:  [place] (if you have MyChart) OR A paper copy in the mail.  If you have any lab test that is abnormal or we need to change your treatment, we will call you to review the results. You may go to any [facility] that is convenient for you however, we do have a lab in our office that is able to assist you. You DO NOT need an appointment for our lab. The lab is open 8:00am and closes at 4:00pm. Lunch 12:45 - 1:45pm.  Testing/Procedures: Your physician has recommended that you wear a 14 DAY ZIO-PATCH monitor. The Zio patch cardiac monitor continuously records heart rhythm data, this is for patients being evaluated for multiple types heart rhythms. For the first 24 hours post application, please avoid getting the Zio monitor wet in the shower or by excessive sweating during exercise. After that, feel free to carry on with regular activities. Keep soaps and lotions away from the ZIO XT Patch.  Someone will be calling you to let you know when this is mailed.  This will be mailed to you, please expect 7-10 days to receive.      Call [facility] at [phone] if you have questions regarding your ZIO XT patch monitor.  Call them immediately if you see an orange light blinking on your monitor.   If your monitor falls off in less than 4 days contact our Monitor department at [phone].  If your monitor becomes loose or falls off after 4 days call [employer] at [phone] for suggestions on securing your monitor  Special Instructions PLEASE FAX  LIPOPROTEIN-A TO [phone]  PLEASE READ AND FOLLOW SALTY 6-ATTACHED   Follow-Up: Your next appointment:  8 week(s) In Person with Minus Breeding, MD -[place], FNP-C    At [facility], you and your health needs are our priority.  As part of our continuing mission to provide you with exceptional heart care, we have created designated Provider Care Teams.  These Care Teams include your primary Cardiologist (physician) and Advanced Practice Providers (APPs -  Physician Assistants and Nurse Practitioners) who all work together to provide you with the care you need, when you need it.

## 2020-08-26 LAB — LIPID PANEL
Chol/HDL Ratio: 3.3 ratio (ref 0.0–4.4)
Cholesterol, Total: 169 mg/dL (ref 100–199)
HDL: 51 mg/dL (ref >39)
LDL Chol Calc (NIH): 102 mg/dL — ABNORMAL HIGH (ref 0–99)
Triglycerides: 86 mg/dL (ref 0–149)
VLDL Cholesterol Cal: 16 mg/dL (ref 5–40)

## 2020-08-28 ENCOUNTER — Telehealth: Payer: Self-pay | Admitting: Cardiology

## 2020-08-28 DIAGNOSIS — R002 Palpitations: Secondary | ICD-10-CM

## 2020-08-28 NOTE — Telephone Encounter (Signed)
Patient states her monitor caused a rash and she is requesting to speak with Katrina to discuss wearing an alternative. Please call.

## 2020-08-28 NOTE — Telephone Encounter (Signed)
Pt called in as she is having reaction to her Zio patch. She is expericneing severe burning and itching. Patient has removed patch and will mail it back . Pt scheduled to come in on Monday 10/25 for replacement monitor. Orders for event placed and patient scheduled for 8:30a appointment at [facility]. No additional questions at this time.

## 2020-08-31 ENCOUNTER — Encounter: Payer: Self-pay | Admitting: *Deleted

## 2020-08-31 ENCOUNTER — Other Ambulatory Visit: Payer: Self-pay

## 2020-08-31 ENCOUNTER — Ambulatory Visit (INDEPENDENT_AMBULATORY_CARE_PROVIDER_SITE_OTHER): Payer: BC Managed Care – PPO

## 2020-08-31 ENCOUNTER — Ambulatory Visit: Payer: BC Managed Care – PPO

## 2020-08-31 DIAGNOSIS — R002 Palpitations: Secondary | ICD-10-CM

## 2020-08-31 NOTE — Progress Notes (Signed)
Patient ID: Brooke Turner, female   DOB: 08-17-1976, 44 y.o.   MRN: [id number] [employer] representative notified to cancel charges for ZIO XT [id number] due to patient having allergic reaction. Applied [employer] 30 day cardiac event monitor with bridge and sensitive skin electrodes.  Presented patient with 4 types of electrodes to try.

## 2020-09-01 DIAGNOSIS — R002 Palpitations: Secondary | ICD-10-CM | POA: Diagnosis not present

## 2020-09-07 ENCOUNTER — Other Ambulatory Visit (HOSPITAL_BASED_OUTPATIENT_CLINIC_OR_DEPARTMENT_OTHER): Payer: Self-pay

## 2020-09-07 DIAGNOSIS — G478 Other sleep disorders: Secondary | ICD-10-CM

## 2020-09-08 ENCOUNTER — Other Ambulatory Visit: Payer: Self-pay

## 2020-09-08 ENCOUNTER — Ambulatory Visit (INDEPENDENT_AMBULATORY_CARE_PROVIDER_SITE_OTHER): Payer: BC Managed Care – PPO | Admitting: Family Medicine

## 2020-09-08 DIAGNOSIS — Z23 Encounter for immunization: Secondary | ICD-10-CM

## 2020-09-08 DIAGNOSIS — E785 Hyperlipidemia, unspecified: Secondary | ICD-10-CM

## 2020-09-08 DIAGNOSIS — Z79899 Other long term (current) drug therapy: Secondary | ICD-10-CM

## 2020-09-08 DIAGNOSIS — R002 Palpitations: Secondary | ICD-10-CM

## 2020-09-14 ENCOUNTER — Telehealth: Payer: Self-pay | Admitting: Cardiology

## 2020-09-14 NOTE — Telephone Encounter (Signed)
New Message:    Pt is scheduled  For dental work tomorrow morning. She is wearing a Monitor, her dentist wants to know if it is alright for pt to have dental work tomorrow?

## 2020-09-14 NOTE — Telephone Encounter (Signed)
Advised pt that wearing the event monitor is not a contraindication for having dental work. Pt verbalizes understanding.

## 2020-09-18 ENCOUNTER — Other Ambulatory Visit: Payer: Self-pay | Admitting: Internal Medicine

## 2020-09-18 ENCOUNTER — Telehealth: Payer: Self-pay | Admitting: Internal Medicine

## 2020-09-18 MED ORDER — LEVALBUTEROL TARTRATE 45 MCG/ACT IN AERO: INHALATION_SPRAY | RESPIRATORY_TRACT | 0 refills | Status: DC

## 2020-09-18 NOTE — Telephone Encounter (Signed)
Request was probably denied for the refill of her xopenex inhaler due to pt not being seen since 05/23/2019.  Pt needs to have f/u in order to continue receiving med refills.  Attempted to call pt but unable to reach. Left message for her to return call.

## 2020-09-18 NOTE — Telephone Encounter (Signed)
Pt was on phone with front staff making a follow up appt. Pt is going to be seen in office next Friday 11/19. 1 inhaler of pt's xopenex inhaler has been sent to preferred pharmacy so she could have to take with her when she goes out of town. Pharmacy was verified. Nothing further needed.

## 2020-09-19 ENCOUNTER — Other Ambulatory Visit (HOSPITAL_COMMUNITY): Payer: BC Managed Care – PPO

## 2020-09-21 DIAGNOSIS — Z20822 Contact with and (suspected) exposure to covid-19: Secondary | ICD-10-CM | POA: Diagnosis not present

## 2020-09-22 NOTE — Progress Notes (Signed)
Left message for patient regarding appointment scheduled for manometry and PH test tomorrow. Pt has not gotten a covid test. Left message on 11/15 also. Asked patient to call me back [phone] regarding test and covid test appointment.

## 2020-09-22 NOTE — Progress Notes (Signed)
Pt called back and stated she wanted to reschedule her appointment for tomorrow. Advised her to call Dr. Tarri Glenn office when she wants to reschedule. She stated she had a lot going on right now.

## 2020-09-23 ENCOUNTER — Encounter (HOSPITAL_COMMUNITY): Admission: RE | Payer: Self-pay | Source: Home / Self Care

## 2020-09-23 ENCOUNTER — Ambulatory Visit (HOSPITAL_COMMUNITY)
Admission: RE | Admit: 2020-09-23 | Payer: BC Managed Care – PPO | Source: Home / Self Care | Admitting: Gastroenterology

## 2020-09-23 SURGERY — MANOMETRY, ESOPHAGUS

## 2020-09-25 ENCOUNTER — Ambulatory Visit: Payer: BC Managed Care – PPO | Admitting: Pulmonary Disease

## 2020-09-29 DIAGNOSIS — Z20822 Contact with and (suspected) exposure to covid-19: Secondary | ICD-10-CM | POA: Diagnosis not present

## 2020-10-11 IMAGING — CT CT ANGIO CHEST
2 of 7 series · 19 of 46 positions shown · IV contrast (APPLIED)
Comparison: 12/10/2014

CLINICAL DATA: Left leg pain, chest tightness, and shortness of
breath. Multiple car trips to [place].

EXAM:
CT ANGIOGRAPHY CHEST WITH CONTRAST
TECHNIQUE: Multidetector CT imaging of the chest was performed using the
standard protocol during bolus administration of intravenous
contrast. Multiplanar CT image reconstructions and MIPs were
obtained to evaluate the vascular anatomy.
CONTRAST:  100mL YQPLQ5-OVY IOPAMIDOL (YQPLQ5-OVY) INJECTION 76%

[Series 8: thins · axial · 0.66mm/px · z∈[-409,-150]mm · 16 of 418 slices shown]
[im 24/418  lung]
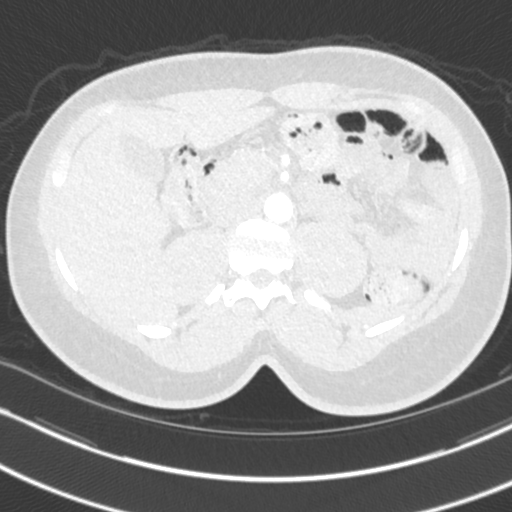
[im 47/418  soft-tissue]
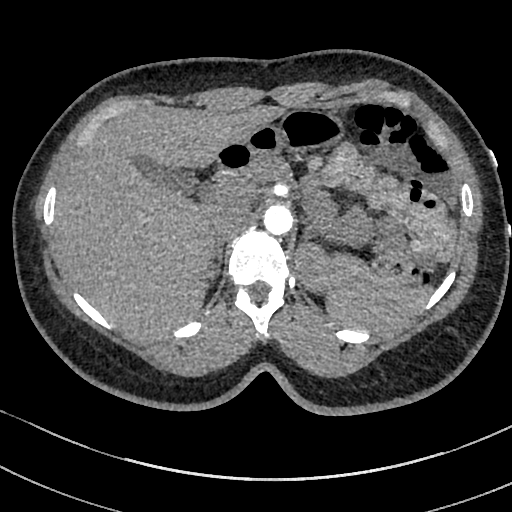
[im 70/418  lung]
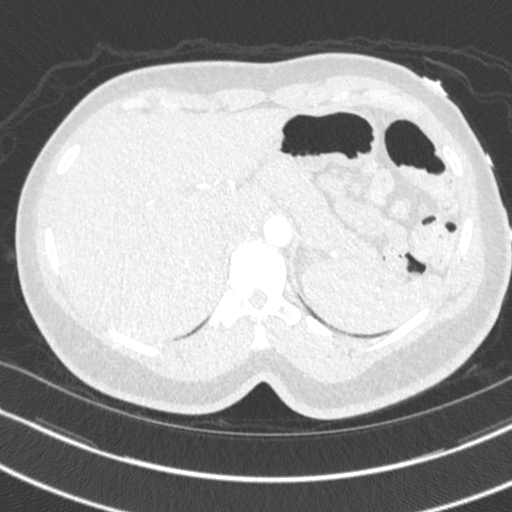
[im 93/418  soft-tissue]
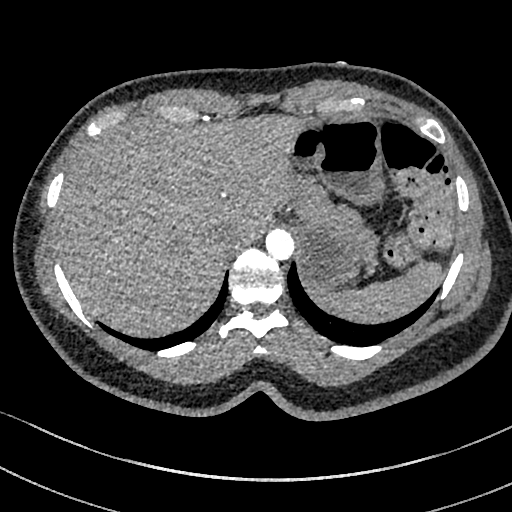
[im 116/418  lung]
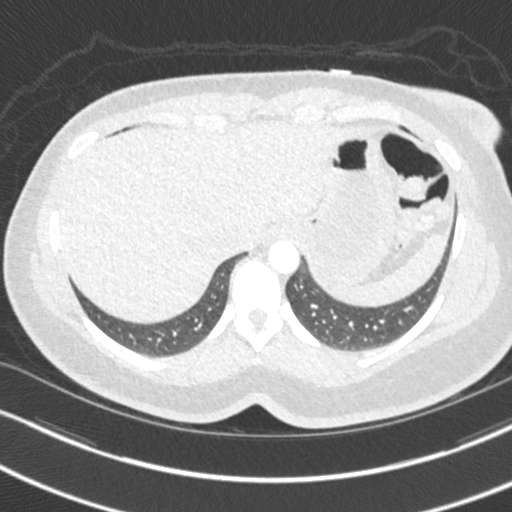
[im 140/418  soft-tissue]
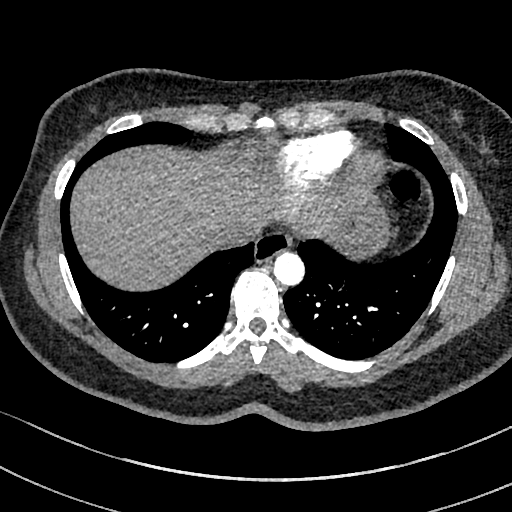
[im 163/418  lung]
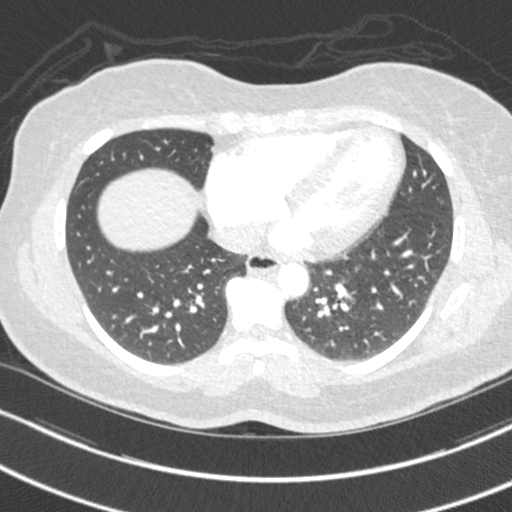
[im 186/418  soft-tissue]
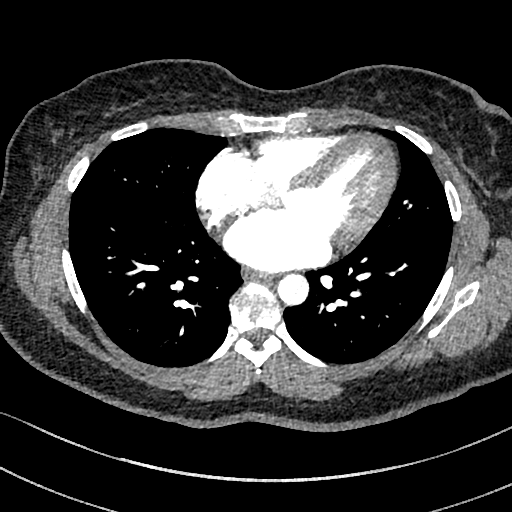
[im 232/418  lung]
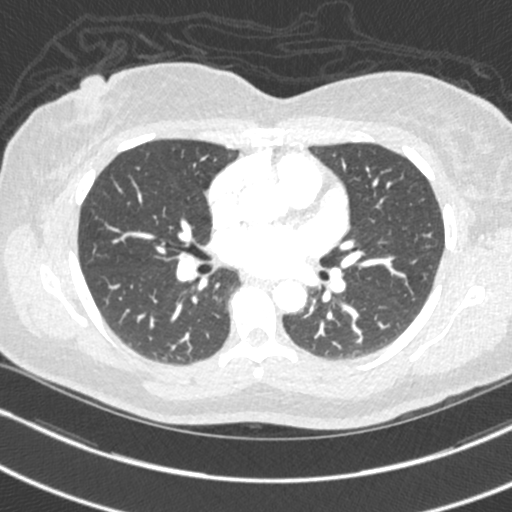
[im 255/418  soft-tissue]
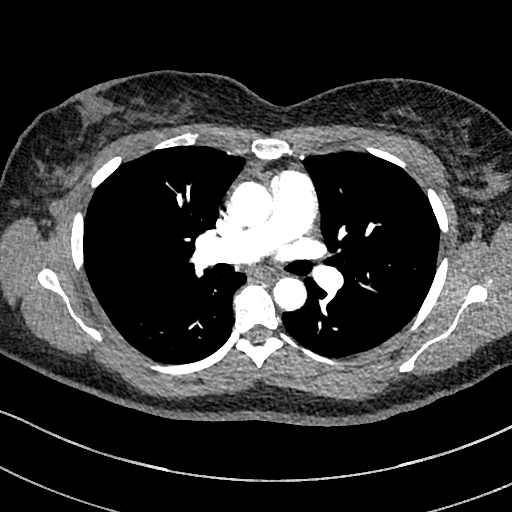
[im 279/418  lung]
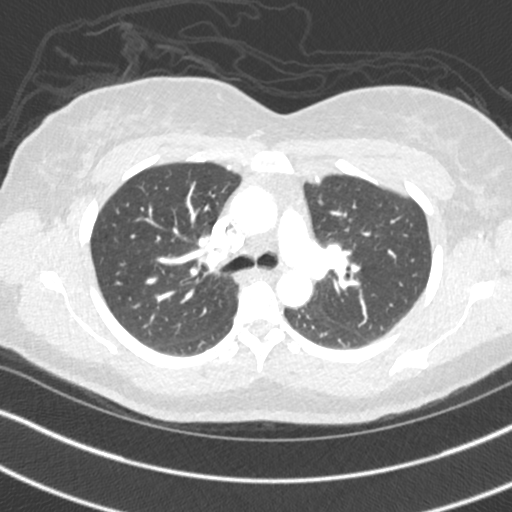
[im 302/418  soft-tissue]
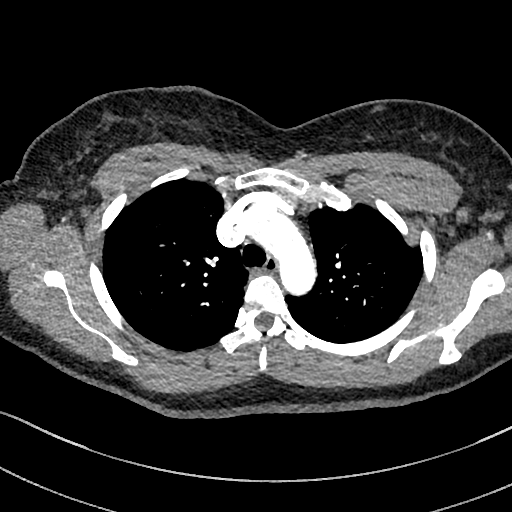
[im 325/418  lung]
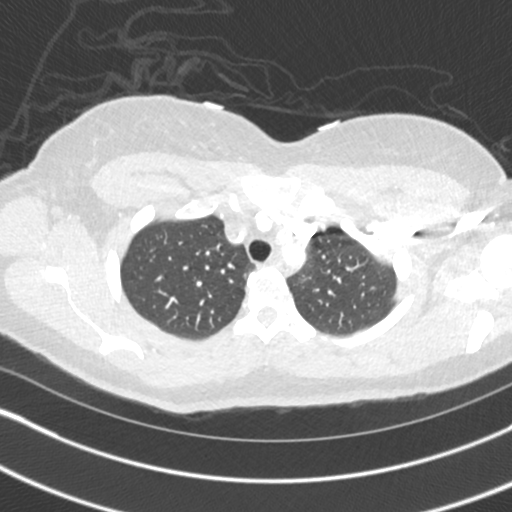
[im 348/418  soft-tissue]
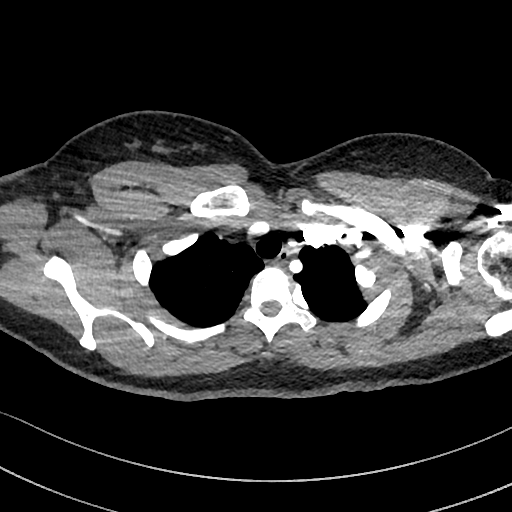
[im 371/418  lung]
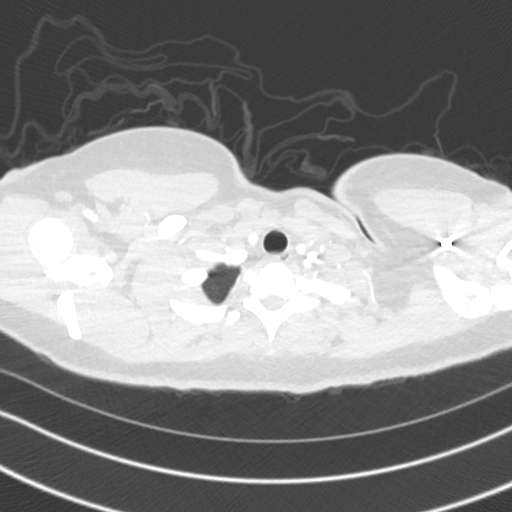
[im 394/418  soft-tissue]
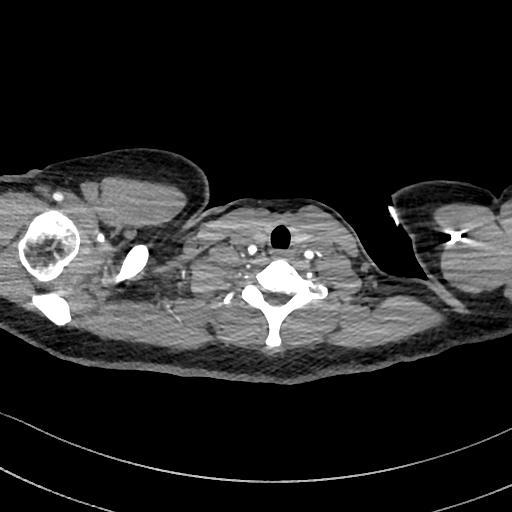

[Series 9: cor · coronal · 0.59mm/px · 3 of 99 slices shown]
[im 25/99  soft-tissue]
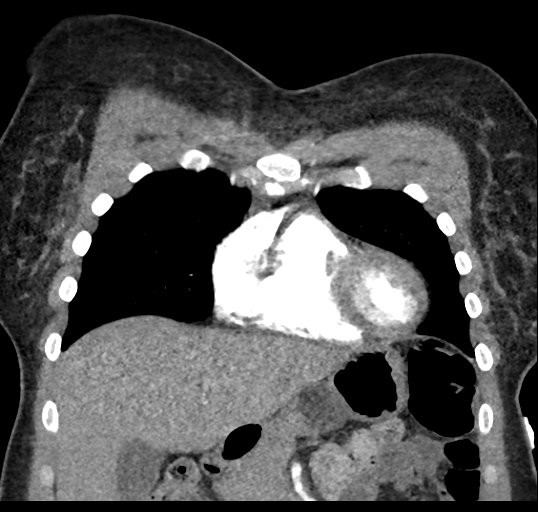
[im 50/99  soft-tissue]
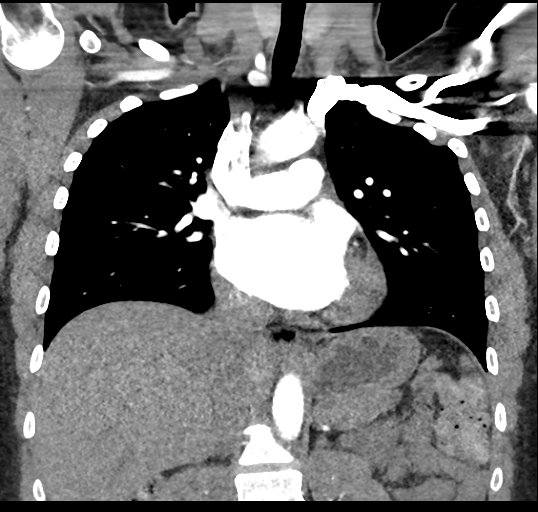
[im 74/99  soft-tissue]
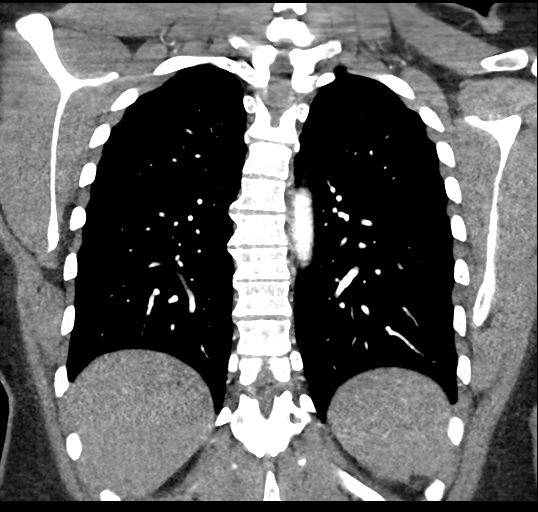

[19 of 46 positions shown; findings below may reference images not displayed]

FINDINGS: Cardiovascular: Good opacification of the central and segmental
pulmonary arteries. No focal filling defects. No evidence of
significant pulmonary embolus. Normal heart size. No pericardial
effusions. Normal caliber thoracic aorta. No aortic dissection.
Great vessel origins are patent.

Mediastinum/Nodes: Esophagus is decompressed. No significant
lymphadenopathy in the chest.

Lungs/Pleura: Lungs are clear and expanded. No pleural effusions. No
pneumothorax. Airways are patent.

Upper Abdomen: No acute abnormalities are demonstrated.

Musculoskeletal: No chest wall abnormality. No acute or significant
osseous findings.

Review of the MIP images confirms the above findings.
IMPRESSION: 1. No evidence of significant pulmonary embolus.
2. No evidence of active pulmonary disease.

## 2020-10-13 ENCOUNTER — Other Ambulatory Visit: Payer: Self-pay

## 2020-10-13 ENCOUNTER — Ambulatory Visit (HOSPITAL_BASED_OUTPATIENT_CLINIC_OR_DEPARTMENT_OTHER): Payer: BC Managed Care – PPO | Attending: Family Medicine | Admitting: Internal Medicine

## 2020-10-13 ENCOUNTER — Telehealth: Payer: Self-pay | Admitting: Cardiology

## 2020-10-13 VITALS — Ht 64.0 in | Wt 180.0 lb

## 2020-10-13 DIAGNOSIS — G478 Other sleep disorders: Secondary | ICD-10-CM | POA: Insufficient documentation

## 2020-10-13 NOTE — Telephone Encounter (Signed)
Patient returning call for monitor results. She requests a call back between 2:45 pm - 3 pm when she is on her lunch break.

## 2020-10-13 NOTE — Telephone Encounter (Signed)
Pt called in and would like a detail message left on her vm if she I not able to answer .

## 2020-10-27 DIAGNOSIS — Z03818 Encounter for observation for suspected exposure to other biological agents ruled out: Secondary | ICD-10-CM | POA: Diagnosis not present

## 2020-10-31 DIAGNOSIS — G478 Other sleep disorders: Secondary | ICD-10-CM

## 2020-10-31 NOTE — Procedures (Signed)
    Patient Name: Brooke, Turner Date: 10/13/2020 Gender: Female D.O.B: [birth year] Age (years): 44 Referring Provider: [name] Height (inches): 62 Interpreting Physician: Baird Lyons MD, ABSM Weight (lbs): 180 RPSGT: Laren Everts BMI: 31 MRN: [id number] Neck Size: 15.00  CLINICAL INFORMATION Sleep Study Type: NPSG Indication for sleep study: Diabetes, Fatigue, Hypertension, Obesity, OSA, Re-Evaluation Epworth Sleepiness Score: 5  Most recent polysomnogram dated 06/11/2019 revealed an AHI of 0/h and RDI of 10/h. SLEEP STUDY TECHNIQUE As per the AASM Manual for the Scoring of Sleep and Associated Events v2.3 (April 2016) with a hypopnea requiring 4% desaturations.  The channels recorded and monitored were frontal, central and occipital EEG, electrooculogram (EOG), submentalis EMG (chin), nasal and oral airflow, thoracic and abdominal wall motion, anterior tibialis EMG, snore microphone, electrocardiogram, and pulse oximetry.  MEDICATIONS Medications self-administered by patient taken the night of the study : ATENOLOL, LORAZEPAM, Lo Loestrin FE, TUMS  SLEEP ARCHITECTURE The study was initiated at 10:53:19 PM and ended at 5:05:33 AM.  Sleep onset time was 60.5 minutes and the sleep efficiency was 70.8%. The total sleep time was 263.5 minutes.  Stage REM latency was 48.5 minutes.  The patient spent 8.16% of the night in stage N1 sleep, 66.03% in stage N2 sleep, 0.00% in stage N3 and 25.8% in REM.  Alpha intrusion was absent.  Supine sleep was 52.37%.  RESPIRATORY PARAMETERS The overall apnea/hypopnea index (AHI) was 0.5 per hour. There were 1 total apneas, including 0 obstructive, 1 central and 0 mixed apneas. There were 1 hypopneas and 17 RERAs.  The AHI during Stage REM sleep was 0.9 per hour.  AHI while supine was 0.4 per hour.  The mean oxygen saturation was 97.54%. The minimum SpO2 during sleep was 95.00%.  snoring was noted during this  study.  CARDIAC DATA The 2 lead EKG demonstrated sinus rhythm. The mean heart rate was 63.65 beats per minute. Other EKG findings include: None.  LEG MOVEMENT DATA The total PLMS were 0 with a resulting PLMS index of 0.00. Associated arousal with leg movement index was 0.0 .  IMPRESSIONS - No significant obstructive sleep apnea occurred during this study (AHI = 0.5/h). - No significant central sleep apnea occurred during this study (CAI = 0.2/h). - Insignificant oxygen desaturation was noted during this study (Min O2 = 95.00%). - No snoring was audible during this study. - No cardiac abnormalities were noted during this study. - Clinically significant periodic limb movements did not occur during sleep.  - Patient woke spontaneously from REM at 01:02 AM, stated she "woke up fast and startled". Associated recording unremarkable.  DIAGNOSIS - Normal study  RECOMMENDATIONS - Manage for symptoms based on clinical judgment - Sleep hygiene should be reviewed to assess factors that may improve sleep quality. - Weight management and regular exercise should be initiated or continued if appropriate.  [Electronically signed] 10/31/2020 12:34 PM  Baird Lyons MD, [place], American Board of Sleep Medicine   NPI: [id number]                        [place] [place] [facility]  ELECTRONICALLY SIGNED ON:  10/31/2020, 12:31 PM [place] PH: [phone]   FX: [phone] [place]

## 2020-11-05 DIAGNOSIS — Z20822 Contact with and (suspected) exposure to covid-19: Secondary | ICD-10-CM | POA: Diagnosis not present

## 2020-11-05 DIAGNOSIS — M940 Chondrocostal junction syndrome [Tietze]: Secondary | ICD-10-CM | POA: Diagnosis not present

## 2020-11-05 DIAGNOSIS — R001 Bradycardia, unspecified: Secondary | ICD-10-CM | POA: Diagnosis not present

## 2020-11-05 DIAGNOSIS — I10 Essential (primary) hypertension: Secondary | ICD-10-CM | POA: Diagnosis not present

## 2020-11-23 NOTE — Progress Notes (Signed)
Cardiology Clinic Note   Patient Name: Brooke Turner Date of Encounter: 11/25/2020  Primary Care Provider:  Patient, No Pcp Per Primary Cardiologist:  Minus Breeding, MD  Patient Profile    Brooke Turner 45 year old female presents to the clinic for  evaluation of her rapid heartbeat.  Past Medical History    Past Medical History:  Diagnosis Date  . Anxiety   . Back pain   . Chest pain    a. 09/2014 Echo: EF 60-65%, no rwma, nl valves;  b. 12/2014 Neg ETT.  . Essential hypertension    a. 01/2016 Renal duplex: no RAS- ? renal cyst (renal u/s 4/17 - no cyst, nl study); b. 02/2016 24 hr BP Monitor: Mean SBP ~ 138 with Max of 198. Highest pressures noted in evening hours following placement of cuff.  . Fibroid uterus   . GERD (gastroesophageal reflux disease)   . Palpitations    a. 09/2014 nl event monitor.   Past Surgical History:  Procedure Laterality Date  . ESOPHAGOGASTRODUODENOSCOPY  01/2020  . WISDOM TOOTH EXTRACTION      Allergies  Allergies  Allergen Reactions  . Penicillins Hives    Has patient had a PCN reaction causing immediate rash, facial/tongue/throat swelling, SOB or lightheadedness with hypotension:Yes Has patient had a PCN reaction causing severe rash involving mucus membranes or skin necrosis:unsure Has patient had a PCN reaction that required hospitalization: Yes Has patient had a PCN reaction occurring within the last 10 years: No If all of the above answers are "NO", then may proceed with Cephalosporin use.     . Tamiflu [Oseltamivir Phosphate] Nausea And Vomiting    Excessive vomiting  . Tinidazole Other (See Comments)    Caused severe abd pain/type of colitis   . Diltiazem Other (See Comments)    Dizzy, felt like she was going to pass out Blood pressure went up per patient     History of Present Illness    Ms. Turner has a PMH of anxiety, back pain, chest pain (echocardiogram 11/15 EF 60-65%, 2/16 negative  ETT.),  Essential hypertension, GERD, and palpitations.  She was last seen by Dr. Percival Spanish on 03/26/2020.  During that time she was seen for follow-up evaluation of her chest pain.  She did not tolerate 60 mg of Imdur.  Her dose was reduced to 15 mg which she tolerated much better.  She was also started on pantoprazole which helped improved her chest discomfort.  She denied shortness of breath, PND, orthopnea, palpitations, presyncope, syncope, lower extremity edema, and weight gain.  She walked in the clinic 08/21/2020 and indicated she had been having episodes of fast heartbeat for several months.  She noticed increased episodes when she had a lot of gas.  She presented to the clinic 08/25/2020 for evaluation and stated over the past several weeks she had noticed increasing episodes of irregular heart rate.  She stated that these episodes come on just prior to belching and subside.  She indicated that she also has a family history of elevated lipoprotein a and was tested.  She was having the results faxed to our office.  She denied use of caffeine and triggers that may cause palpitations.  She also noticed intermittent periods of lightheadedness not associated with palpitations.  We reviewed triggers for palpitations.  I  ordered a 14-day ZIO monitor and fasting lipid panel.   Follow-up was planned for 8 weeks.  Her cardiac event monitor showed no arrhythmias.  Her symptoms occurred during  normal sinus rhythm.  Her event monitor showed normal sinus rhythm, rare atrial ectopy, and no sustained arrhythmias  She presents to the clinic today for follow-up evaluation states she has noticed some increased activity intolerance and shortness of breath since last fall.  She has decreased her physical activity and noticed that she is not able to do the physical activity that she was able to do prior to last fall.  We reviewed her cardiac event monitor results and I reassured her that her episodes of palpitations  related to increased gas did not show cardiac issues.  She expressed understanding.  I have encouraged her to keep a food log and cut out foods that cause her symptoms/issues.  We will order an echocardiogram to follow-up on her dyspnea with exertion.  I will have her follow-up in 3 months, give her the salty 6 diet sheet, and have her increase her physical activity as tolerated.  Today she denies chest pain, lower extremity edema, fatigue, palpitations, melena, hematuria, hemoptysis, diaphoresis, weakness, presyncope, syncope, orthopnea, and PND.  Home Medications    Prior to Admission medications   Medication Sig Start Date End Date Taking? Authorizing Provider  acetaminophen (TYLENOL) 500 MG tablet Take 500 mg by mouth every 6 (six) hours as needed (back pain/ cramps).     [provider]  atenolol (TENORMIN) 25 MG tablet Take 1 tablet by mouth twice daily 06/24/20   Minus Breeding, MD  blood glucose meter kit and supplies Test blood sugar twice daily. Dx code: R73.09 04/25/19   Maryruth Hancock, MD  busPIRone (BUSPAR) 10 MG tablet Take 10 mg by mouth daily.    [provider]  cholecalciferol (VITAMIN D3) 25 MCG (1000 UNIT) tablet Take 1,000 Units by mouth daily.    [provider]  ELDERBERRY PO Take 1 capsule by mouth daily.    [provider]  famotidine (PEPCID) 20 MG tablet Take 1 tablet (20 mg total) by mouth 2 (two) times daily. 11/20/18   [name], MD  ferrous sulfate 325 (65 FE) MG tablet Take 1 tablet (325 mg total) by mouth every other day. Please take with a source of Vitamin C 06/04/20   Orson Slick, MD  fluticasone [facility]) 50 MCG/ACT nasal spray Place 2 sprays into both nostrils daily. Patient taking differently: Place 2 sprays into both nostrils as needed (seasonal allergies).  10/10/17   Tenna Delaine D, PA-C  glucose blood (ONETOUCH VERIO) test strip TEST BLOOD SUGAR DAILY 09/02/19   Forrest Moron, MD  levalbuterol [facility] HFA)  45 MCG/ACT inhaler Inhale 1-2 puffs every 6 hours if needed- rescue inhaler 09/18/20   Baird Lyons D, MD  levocetirizine (XYZAL) 5 MG tablet levocetirizine 5 mg tablet  TAKE 1 TABLET BY MOUTH AT BEDTIME FOR 2 WEEKS THEN AS NEEDED    [provider]  LORazepam (ATIVAN) 0.5 MG tablet Take 1 tablet (0.5 mg total) by mouth every 6 (six) hours as needed for anxiety. Patient taking differently: Take 0.25 mg by mouth 3 (three) times daily as needed for anxiety.  03/19/19   Forrest Moron, MD  meclizine (ANTIVERT) 25 MG tablet as needed. prn    [provider]  Multiple Vitamins-Minerals (MULTIVITAMIN GUMMIES ADULT PO) Take 2 tablets by mouth daily.     [provider]  Norethindrone-Ethinyl Estradiol-Fe Biphas (LO LOESTRIN FE) 1 MG-10 MCG / 10 MCG tablet Take 1 tablet by mouth at bedtime.     [provider]  ONETOUCH DELICA LANCETS 38V MISC USE TO TEST BLOOD SUGAR DAILY 11/16/18   [name], MD  pantoprazole (PROTONIX) 40 MG tablet Take 1 tablet by mouth twice daily 06/09/20   Levin Erp, PA  Peppermint Oil (IBGARD) 90 MG CPCR Use as directed 06/04/20   Thornton Park, MD  SF 5000 PLUS 1.1 % CREA dental cream Take by mouth 2 (two) times daily. 05/29/20   [provider]  sucralfate (CARAFATE) 1 g tablet Take 1 tablet (1 g total) by mouth 4 (four) times daily as needed. Make a slurry 08/10/20   Thornton Park, MD  tranexamic acid (LYSTEDA) 650 MG TABS tablet tranexamic acid 650 mg tablet  TAKE 2 TABLETS BY MOUTH THREE TIMES DAILY    [provider]  vitamin B-12 (CYANOCOBALAMIN) 1000 MCG tablet Take 1,000 mcg by mouth daily.     [provider]    Family History    Family History  Problem Relation Age of Onset  . Seizures Mother   . Epilepsy Mother   . Cancer Father        Liver  . Diabetes Father 21  . Hypertension Father   . Heart disease Father 81       CEA, LE [place]  . Alzheimer's disease Maternal  Grandmother   . Heart disease Maternal Grandfather   . Colon cancer Neg Hx   . Breast cancer Neg Hx    She indicated that her mother is alive. She indicated that her father is deceased. She indicated that her sister is alive. She indicated that her brother is alive. She indicated that her maternal grandmother is deceased. She indicated that her maternal grandfather is deceased. She indicated that her paternal grandmother is deceased. She indicated that her paternal grandfather is deceased. She indicated that the status of her neg hx is unknown.  Social History    Social History   Socioeconomic History  . Marital status: Single    Spouse name: N/A  . Number of children: 0  . Years of education: 7  . Highest education level: Not on file  Occupational History  . Occupation: Research scientist (physical sciences): [employer]  Tobacco Use  . Smoking status: Former Smoker    Packs/day: 0.25    Years: 10.00    Pack years: 2.50    Types: Cigarettes    Start date: 06/25/2000    Quit date: 08/19/2014    Years since quitting: 6.2  . Smokeless tobacco: Never Used  Vaping Use  . Vaping Use: Never used  Substance and Sexual Activity  . Alcohol use: No    Alcohol/week: 0.0 standard drinks  . Drug use: No  . Sexual activity: Not Currently    Partners: Male    Birth control/protection: Pill  Other Topics Concern  . Not on file  Social History Narrative   Patient lives at home alone .   Patient works full time at [employer]   Education college.   Right handed.   Caffeine none   Social Determinants of Health   Financial Resource Strain: Not on file  Food Insecurity: Not on file  Transportation Needs: Not on file  Physical Activity: Not on file  Stress: Not on file  Social Connections: Not on file  Intimate Partner Violence: Not on file     Review of Systems    General:  No chills, fever, night sweats or weight changes.  Cardiovascular:  No chest pain, dyspnea on exertion,  edema,  orthopnea, palpitations, paroxysmal nocturnal dyspnea. Dermatological: No rash, lesions/masses Respiratory: No cough, dyspnea Urologic: No hematuria, dysuria Abdominal:   No nausea, vomiting, diarrhea, bright red blood per rectum, melena, or hematemesis Neurologic:  No visual changes, wkns, changes in mental status. All other systems reviewed and are otherwise negative except as noted above.  Physical Exam    VS:  BP 124/78 (BP Location: Left Arm, Patient Position: Sitting, Cuff Size: Normal)   Pulse 72   Wt 197 lb 12.8 oz (89.7 kg)   SpO2 100%   BMI 33.95 kg/m  , BMI Body mass index is 33.95 kg/m. GEN: Well nourished, well developed, in no acute distress. HEENT: normal. Neck: Supple, no JVD, carotid bruits, or masses. Cardiac: RRR, no murmurs, rubs, or gallops. No clubbing, cyanosis, edema.  Radials/DP/PT 2+ and equal bilaterally.  Respiratory:  Respirations regular and unlabored, clear to auscultation bilaterally. GI: Soft, nontender, nondistended, BS + x 4. MS: no deformity or atrophy. Skin: warm and dry, no rash. Neuro:  Strength and sensation are intact. Psych: Normal affect.  Accessory Clinical Findings    Recent Labs: 06/04/2020: ALT 12; BUN 15; Creatinine 1.11; Hemoglobin 12.5; Platelet Count 261; Potassium 4.5; Sodium 138   Recent Lipid Panel    Component Value Date/Time   CHOL 169 08/25/2020 1221   TRIG 86 08/25/2020 1221   HDL 51 08/25/2020 1221   CHOLHDL 3.3 08/25/2020 1221   LDLCALC 102 (H) 08/25/2020 1221   LDLDIRECT 101 (H) 10/25/2016 1850    ECG personally reviewed by me today-none today.  EKG 08/25/2020 normal sinus rhythm possible left atrial enlargement 89 bpm no ST or T wave deviation  Echocardiogram 04/10/2019 IMPRESSIONS    1. The left ventricle has normal systolic function with an ejection  fraction of 60-65%. The cavity size was normal. Left ventricular diastolic  Doppler parameters are consistent with impaired relaxation. No  evidence of  left ventricular regional wall  motion abnormalities.  2. The right ventricle has normal systolic function. The cavity was  normal. There is no increase in right ventricular wall thickness.  3. No evidence of mitral valve stenosis. No significant regurgitation.  4. The aortic valve is tricuspid. No stenosis of the aortic valve.  5. The aortic root is normal in size and structure.  6. Normal IVC size. No complete TR doppler jet so unable to estimate PA  systolic pressure.  Cardiac event monitor 02/28/2019 Normal sinus rhythm Rare ectopy No significant arrhythmias Complaints including chest pain and fluttering did not correlate with arrhythmia Symptoms occurred with NSR  Cardiac event monitor 10/07/2020 Normal sinus rhythm Rare atrial ectopy Multiple symptoms of chest pain, fluttering and skipped beats occur during normal sinus rhythm There are no sustained arrhythmias   Assessment & Plan   1.  DOE/SOB- with increased physical activity.  Reports symptoms present since fall 2021. Repeat echocardiogram Continue atenolol Heart healthy low-sodium diet-salty 6 given Increase physical activity as tolerated  Palpitations- has noticed reduction in symptoms with decreasing dairy.   Walked into the office on 08/21/2020 and indicated she had been having increased episodes of rapid heartbeat.  TSH drawn at PCP and was normal.  Cardiac event monitor 10/07/2020 showed normal sinus rhythm, rare atrial ectopy, and no sustained arrhythmias.  Multiple symptoms of chest pain, fluttering, and skipped beats occurred during normal sinus rhythm.  Reassured that symptoms were not related to cardiac issues. Continue atenolol Heart healthy low-sodium diet-salty 6 given Increase physical activity as tolerated Avoid triggers caffeine, chocolate, EtOH etc.  Patient reassured that symptoms were not related to cardiac issues.  Chest wall  pain- no pain today.    Previously placed on 60 mg of  Imdur which she did not tolerate however, dose reduced to 15 which she has tolerated well. Continue Imdur, Protonix Heart healthy low-sodium diet Increase physical activity  Hyperlipidemia-08/25/2020: Cholesterol, Total 169; HDL 51; LDL Chol Calc (NIH) 102; Triglycerides 86 Heart healthy low-sodium high-fiber diet Increase physical activity as tolerated    Disposition: Follow-up with Dr. Percival Spanish in  3 months.   Jossie Ng. Bora Bost NP-C    11/25/2020, 10:28 AM [place] [address] Office [phone] Fax [phone]  Notice: This dictation was prepared with Dragon dictation along with smaller phrase technology. Any transcriptional errors that result from this process are unintentional and may not be corrected upon review.  I spent 15 minutes examining this patient, reviewing medications, and using patient centered shared decision making involving her cardiac care.  Prior to her visit I spent greater than 20 minutes reviewing her past medical history,  medications, and prior cardiac tests.

## 2020-11-25 ENCOUNTER — Other Ambulatory Visit: Payer: Self-pay

## 2020-11-25 ENCOUNTER — Ambulatory Visit (INDEPENDENT_AMBULATORY_CARE_PROVIDER_SITE_OTHER): Payer: Managed Care, Other (non HMO) | Admitting: General Practice

## 2020-11-25 ENCOUNTER — Encounter: Payer: Self-pay | Admitting: General Practice

## 2020-11-25 VITALS — BP 124/78 | HR 72 | Wt 197.8 lb

## 2020-11-25 DIAGNOSIS — E785 Hyperlipidemia, unspecified: Secondary | ICD-10-CM | POA: Diagnosis not present

## 2020-11-25 DIAGNOSIS — R0600 Dyspnea, unspecified: Secondary | ICD-10-CM

## 2020-11-25 DIAGNOSIS — R002 Palpitations: Secondary | ICD-10-CM | POA: Diagnosis not present

## 2020-11-25 DIAGNOSIS — R0789 Other chest pain: Secondary | ICD-10-CM | POA: Diagnosis not present

## 2020-11-25 DIAGNOSIS — R0609 Other forms of dyspnea: Secondary | ICD-10-CM

## 2020-11-25 NOTE — Patient Instructions (Signed)
Medication Instructions:  The current medical regimen is effective;  continue present plan and medications as directed. Please refer to the Current Medication list given to you today.  *If you need a refill on your cardiac medications before your next appointment, please call your pharmacy*  Lab Work: NONE  Testing/Procedures: Echocardiogram - Your physician has requested that you have an echocardiogram. Echocardiography is a painless test that uses sound waves to create images of your heart. It provides your doctor with information about the size and shape of your heart and how well your heart's chambers and valves are working. This procedure takes approximately one hour. There are no restrictions for this procedure. This will be performed at our [facility] location - [address].  Special Instructions PLEASE READ AND FOLLOW SALTY 6-ATTACHED-1,800 mg daily  PLEASE INCREASE PHYSICAL ACTIVITY AS TOLERATED  Follow-Up: Your next appointment:  3 month(s) In Person with Minus Breeding, MD   At [facility], you and your health needs are our priority.  As part of our continuing mission to provide you with exceptional heart care, we have created designated Provider Care Teams.  These Care Teams include your primary Cardiologist (physician) and Advanced Practice Providers (APPs -  Physician Assistants and Nurse Practitioners) who all work together to provide you with the care you need, when you need it.

## 2020-11-26 ENCOUNTER — Encounter: Payer: Self-pay | Admitting: Physical Therapy

## 2020-11-26 NOTE — Therapy (Signed)
[facility] [address] [place], [place], [address] Phone: [phone]   Fax:  [phone]  Patient Details  Name: Brooke Turner MRN: [id number] Date of Birth: February 08, 1976 Referring Provider:  Cameron Sprang, MD  Encounter Date: 11/26/2020  PHYSICAL THERAPY DISCHARGE SUMMARY  Visits from Start of Care: 2  Current functional level related to goals / functional outcomes: Unable to formally assess; pt was put on hold for therapy due to cardiac symptoms and was scheduled to return to clinic for re-assessment.  Pt did not return for re-assessment.   Remaining deficits: dizziness   Education / Equipment: N/A  Plan: Patient agrees to discharge.  Patient goals were not met. Patient is being discharged due to not returning since the last visit.  ?????     Rico Junker, PT, DPT 11/26/20    2:40 PM    [facility] [address] [place], [place], [address] Phone: [phone]   Fax:  [phone]

## 2020-12-14 ENCOUNTER — Other Ambulatory Visit (HOSPITAL_COMMUNITY): Payer: 59

## 2020-12-28 ENCOUNTER — Telehealth: Payer: Self-pay | Admitting: Hematology and Oncology

## 2020-12-28 ENCOUNTER — Telehealth: Payer: Self-pay | Admitting: *Deleted

## 2020-12-28 NOTE — Telephone Encounter (Signed)
Called pt per 2/21 sch msg - left message for patient with appt date and time

## 2020-12-28 NOTE — Telephone Encounter (Signed)
Received call from patient, asking about her iron prescription from last July when Dr. Lorenso Courier ordered it.  She states that she states she has not taken it at all because her PCP said she didn't need it. However, pt has been feeling very fatigued lately as her menstrual cycles have resumed after stopping her birth control pills and have been heavier. Advised to take oral iron with food and a source of Vitamin C. Advised that it would be good to check her iron levels again.  She is very agreeable to this.  Scheduling message sent

## 2020-12-31 ENCOUNTER — Telehealth: Payer: Self-pay | Admitting: Neurology

## 2020-12-31 NOTE — Telephone Encounter (Signed)
Pt called and informed Agree with eye doctor appt. Those symptoms do not sound neurologic, pls request for PCP records and let patient know to ask PCP if maybe seeing a rheumatologist may be more helpful for her. Pt asking if she can move her appt from 3/15 to 3/18 or any other day if you have an opening, because she is not going to be able to get off work.

## 2020-12-31 NOTE — Telephone Encounter (Signed)
Patient called in stating she has been really weak, achy, and tired the last few weeks. Her eye has also been twitching. She had had a blood vessel burst a while back in that same right eye. She went to her PCP a few days ago and they wanted her to see Dr. Delice Lesch and her eye doctor. She cannot get off of work on the day of her next appointment 01/19/21. She would like some advice.

## 2020-12-31 NOTE — Telephone Encounter (Signed)
Agree with eye doctor appt. Those symptoms do not sound neurologic, pls request for PCP records and let patient know to ask PCP if maybe seeing a rheumatologist may be more helpful for her, thanks

## 2021-01-05 ENCOUNTER — Ambulatory Visit (HOSPITAL_COMMUNITY): Payer: Managed Care, Other (non HMO) | Attending: Cardiovascular Disease

## 2021-01-05 ENCOUNTER — Other Ambulatory Visit: Payer: Self-pay

## 2021-01-05 DIAGNOSIS — R0789 Other chest pain: Secondary | ICD-10-CM | POA: Diagnosis present

## 2021-01-05 DIAGNOSIS — R0600 Dyspnea, unspecified: Secondary | ICD-10-CM

## 2021-01-05 DIAGNOSIS — R002 Palpitations: Secondary | ICD-10-CM

## 2021-01-05 DIAGNOSIS — R0609 Other forms of dyspnea: Secondary | ICD-10-CM

## 2021-01-05 LAB — ECHOCARDIOGRAM COMPLETE
Area-P 1/2: 3.7 cm2
S' Lateral: 2.5 cm

## 2021-01-06 NOTE — Telephone Encounter (Signed)
I have 2 cancellation slots on March 7, if she wants to take those spots, thanks

## 2021-01-07 NOTE — Telephone Encounter (Signed)
Called and offered openings on 01/08/21 and and 01/11/21, she was not able to take those appointments. I have added her to the wait list for when more come open.

## 2021-01-11 ENCOUNTER — Other Ambulatory Visit: Payer: Managed Care, Other (non HMO)

## 2021-01-11 ENCOUNTER — Ambulatory Visit: Payer: Managed Care, Other (non HMO) | Admitting: Hematology and Oncology

## 2021-01-12 ENCOUNTER — Other Ambulatory Visit: Payer: Self-pay | Admitting: Hematology and Oncology

## 2021-01-12 DIAGNOSIS — D500 Iron deficiency anemia secondary to blood loss (chronic): Secondary | ICD-10-CM

## 2021-01-12 NOTE — Progress Notes (Signed)
Brooke Turner [phone]   [phone]  PROGRESS NOTE  Patient Care Team: Patient, No Pcp Per as PCP - General (General Practice) Minus Breeding, MD as PCP - Cardiology (Cardiology) Deboraha Sprang, MD as PCP - Electrophysiology (Cardiology) Melvenia Beam, MD as Consulting Physician (Otolaryngology) Lendon Colonel, NP as Nurse Practitioner (Cardiology) Cameron Sprang, MD as Consulting Physician (Neurology)  Hematological/Oncological History # Iron Deficiency without Anemia  1) 04/30/2020: WBC 5.7, hgb 13.2, MCV 90.6, Plt 283. Iron 52, ferritin 11.7, transferrin 395.  2) 06/04/2020: establish care with Dr. Lorenso Courier   #Family History of Thalassemia 1) Patient's grandfather had "thalassemia major" with her mom and brother having "thalassemia minor"  2) Hgb electropharesis negative, though may still carry alpha thalassemia.   Interval History:  Brooke Turner 45 y.o. female with medical history significant for iron deficiency anemia presents for a follow up visit. The patient's last visit was on 06/04/2020. In the interim since the last visit she d/c iron therapy, but restarted due to worsening fatigue.   On exam today Brooke Turner notes that she was told that she would be able to stop her iron pills by her primary care provider.  Approximately 1 month ago she began feeling extremely tired and then wanted to restart the iron pills.  She is currently now taking them 1 every other day.  She reports that she continues to have fatigue and shortness of breath.  She notes that with exertion she becomes markedly short of breath.  She does still continue to have heavy menstrual cycles and that she does take birth control without interruption in order to better control the bleeding.  She notes that when she does bleed less approximately 1-1/2 to 2 weeks and that recently she missed a few doses of birth control and had a long menstrual cycle.  She  currently denies any other overt signs of bleeding.  She reports no fevers, chills, sweats, nausea, vomiting or diarrhea.  A full 10 point ROS is listed below.  MEDICAL HISTORY:  Past Medical History:  Diagnosis Date  . Anxiety   . Back pain   . Chest pain    a. 09/2014 Echo: EF 60-65%, no rwma, nl valves;  b. 12/2014 Neg ETT.  . Essential hypertension    a. 01/2016 Renal duplex: no RAS- ? renal cyst (renal u/s 4/17 - no cyst, nl study); b. 02/2016 24 hr BP Monitor: Mean SBP ~ 138 with Max of 198. Highest pressures noted in evening hours following placement of cuff.  . Fibroid uterus   . GERD (gastroesophageal reflux disease)   . Palpitations    a. 09/2014 nl event monitor.    SURGICAL HISTORY: Past Surgical History:  Procedure Laterality Date  . ESOPHAGOGASTRODUODENOSCOPY  01/2020  . WISDOM TOOTH EXTRACTION      SOCIAL HISTORY: Social History   Socioeconomic History  . Marital status: Single    Spouse name: N/A  . Number of children: 0  . Years of education: 51  . Highest education level: Not on file  Occupational History  . Occupation: Research scientist (physical sciences): [employer]  Tobacco Use  . Smoking status: Former Smoker    Packs/day: 0.25    Years: 10.00    Pack years: 2.50    Types: Cigarettes    Start date: 06/25/2000    Quit date: 08/19/2014    Years since quitting: 6.4  . Smokeless tobacco: Never Used  Vaping Use  .  Vaping Use: Never used  Substance and Sexual Activity  . Alcohol use: No    Alcohol/week: 0.0 standard drinks  . Drug use: No  . Sexual activity: Not Currently    Partners: Male    Birth control/protection: Pill  Other Topics Concern  . Not on file  Social History Narrative   Patient lives at home alone .   Patient works full time at [employer]   Education college.   Right handed.   Caffeine none   Social Determinants of Health   Financial Resource Strain: Not on file  Food Insecurity: Not on file  Transportation Needs: Not on  file  Physical Activity: Not on file  Stress: Not on file  Social Connections: Not on file  Intimate Partner Violence: Not on file    FAMILY HISTORY: Family History  Problem Relation Age of Onset  . Seizures Mother   . Epilepsy Mother   . Cancer Father        Liver  . Diabetes Father 7  . Hypertension Father   . Heart disease Father 4       CEA, LE Stenting  . Alzheimer's disease Maternal Grandmother   . Heart disease Maternal Grandfather   . Colon cancer Neg Hx   . Breast cancer Neg Hx     ALLERGIES:  is allergic to penicillins, tamiflu [oseltamivir phosphate], tinidazole, and diltiazem.  MEDICATIONS:  Current Outpatient Medications  Medication Sig Dispense Refill  . acetaminophen (TYLENOL) 500 MG tablet Take 500 mg by mouth every 6 (six) hours as needed (back pain/ cramps).     Marland Kitchen atenolol (TENORMIN) 25 MG tablet Take 1 tablet by mouth twice daily 90 tablet 3  . blood glucose meter kit and supplies Test blood sugar twice daily. Dx code: R73.09 1 each 0  . cholecalciferol (VITAMIN D3) 25 MCG (1000 UNIT) tablet Take 1,000 Units by mouth daily.    Marland Kitchen ELDERBERRY PO Take 1 capsule by mouth daily.    . famotidine (PEPCID) 20 MG tablet Take 1 tablet (20 mg total) by mouth 2 (two) times daily. 180 tablet 0  . ferrous sulfate 325 (65 FE) MG tablet Take 1 tablet (325 mg total) by mouth every other day. Please take with a source of Vitamin C 45 tablet 1  . fluticasone (FLONASE) 50 MCG/ACT nasal spray Place 2 sprays into both nostrils daily. (Patient taking differently: Place 2 sprays into both nostrils as needed (seasonal allergies).) 16 g 0  . glucose blood (ONETOUCH VERIO) test strip TEST BLOOD SUGAR DAILY 25 each 10  . levalbuterol (XOPENEX HFA) 45 MCG/ACT inhaler Inhale 1-2 puffs every 6 hours if needed- rescue inhaler 15 g 0  . levocetirizine (XYZAL) 5 MG tablet levocetirizine 5 mg tablet  TAKE 1 TABLET BY MOUTH AT BEDTIME FOR 2 WEEKS THEN AS NEEDED    . LORazepam (ATIVAN) 0.5 MG  tablet Take 1 tablet (0.5 mg total) by mouth every 6 (six) hours as needed for anxiety. (Patient taking differently: Take 0.25 mg by mouth 3 (three) times daily as needed for anxiety.) 15 tablet 0  . meclizine (ANTIVERT) 25 MG tablet as needed. prn    . Multiple Vitamins-Minerals (MULTIVITAMIN GUMMIES ADULT PO) Take 2 tablets by mouth daily.     . Norethindrone-Ethinyl Estradiol-Fe Biphas (LO LOESTRIN FE) 1 MG-10 MCG / 10 MCG tablet Take 1 tablet by mouth at bedtime.     [name] DELICA LANCETS 59F MISC USE TO TEST BLOOD SUGAR DAILY 100 each 3  .  pantoprazole (PROTONIX) 40 MG tablet Take 1 tablet by mouth twice daily 60 tablet 6  . Peppermint Oil (IBGARD) 90 MG CPCR Use as directed 16 capsule 0  . sucralfate (CARAFATE) 1 g tablet Take 1 tablet (1 g total) by mouth 4 (four) times daily as needed. Make a slurry 30 tablet 0  . tranexamic acid (LYSTEDA) 650 MG TABS tablet tranexamic acid 650 mg tablet  TAKE 2 TABLETS BY MOUTH THREE TIMES DAILY    . vitamin B-12 (CYANOCOBALAMIN) 1000 MCG tablet Take 1,000 mcg by mouth daily.      No current facility-administered medications for this visit.    REVIEW OF SYSTEMS:   Constitutional: ( - ) fevers, ( - )  chills , ( - ) night sweats Eyes: ( - ) blurriness of vision, ( - ) double vision, ( - ) watery eyes Ears, nose, mouth, throat, and face: ( - ) mucositis, ( - ) sore throat Respiratory: ( - ) cough, ( - ) dyspnea, ( - ) wheezes Cardiovascular: ( - ) palpitation, ( - ) chest discomfort, ( - ) lower extremity swelling Gastrointestinal:  ( - ) nausea, ( - ) heartburn, ( - ) change in bowel habits Skin: ( - ) abnormal skin rashes Lymphatics: ( - ) new lymphadenopathy, ( - ) easy bruising Neurological: ( - ) numbness, ( - ) tingling, ( - ) new weaknesses Behavioral/Psych: ( - ) mood change, ( - ) new changes  All other systems were reviewed with the patient and are negative.  PHYSICAL EXAMINATION:  Vitals:   01/13/21 1104  BP: (!) 144/84  Pulse:  76  Resp: 16  Temp: (!) 97.5 F (36.4 C)  SpO2: 100%   Filed Weights   01/13/21 1104  Weight: 201 lb 1.6 oz (91.2 kg)    GENERAL: well appearing middle aged [place] American female. alert, no distress and comfortable SKIN: skin color, texture, turgor are normal, no rashes or significant lesions EYES: conjunctiva are pink and non-injected, sclera clear LUNGS: clear to auscultation and percussion with normal breathing effort HEART: regular rate & rhythm and no murmurs and no lower extremity edema Musculoskeletal: no cyanosis of digits and no clubbing  PSYCH: alert & oriented x 3, fluent speech NEURO: no focal motor/sensory deficits  LABORATORY DATA:  I have reviewed the data as listed CBC Latest Ref Rng & Units 01/13/2021 06/04/2020 01/31/2020  WBC 4.0 - 10.5 K/uL 6.5 6.4 5.0  Hemoglobin 12.0 - 15.0 g/dL 12.5 12.5 12.3  Hematocrit 36.0 - 46.0 % 38.0 37.9 36.5  Platelets 150 - 400 K/uL 254 261 219    CMP Latest Ref Rng & Units 01/13/2021 06/04/2020 01/31/2020  Glucose 70 - 99 mg/dL 103(H) 92 101(H)  BUN 6 - 20 mg/dL 10 15 13   Creatinine 0.44 - 1.00 mg/dL 1.13(H) 1.11(H) 1.06(H)  Sodium 135 - 145 mmol/L 140 138 140  Potassium 3.5 - 5.1 mmol/L 3.9 4.5 3.7  Chloride 98 - 111 mmol/L 106 105 108  CO2 22 - 32 mmol/L 27 26 24   Calcium 8.9 - 10.3 mg/dL 8.1(L) 9.1 8.4(L)  Total Protein 6.5 - 8.1 g/dL 7.2 7.0 6.7  Total Bilirubin 0.3 - 1.2 mg/dL 0.5 1.2 1.3(H)  Alkaline Phos 38 - 126 U/L 72 58 43  AST 15 - 41 U/L 16 18 20   ALT 0 - 44 U/L 15 12 16     RADIOGRAPHIC STUDIES: ECHOCARDIOGRAM COMPLETE  Result Date: 01/05/2021    ECHOCARDIOGRAM REPORT   Patient Name:  Brooke Turner Date of Exam: 01/05/2021 Medical Rec #:  [id number]                  Height:       64.0 in Accession #:    [id number]                 Weight:       197.8 lb Date of Birth:  01-06-1976                  BSA:          1.947 m Patient Age:    29 years                   BP:           124/78 mmHg Patient Gender: F                           HR:           78 bpm. Exam Location:  [facility] Procedure: 2D Echo, 3D Echo, Cardiac Doppler and Color Doppler Indications:    R06.00 Dyspnea  History:        Patient has prior history of Echocardiogram examinations, most                 recent 04/10/2019. Signs/Symptoms:Dyspnea and Chest Pain; Risk                 Factors:Hypertension, Family History of Coronary Artery Disease                 and Former Smoker. Palpitations.  Sonographer:    Deliah Boston RDCS Referring Phys: [id number] Pearson  1. Left ventricular ejection fraction, by estimation, is 60 to 65%. The left ventricle has normal function. The left ventricle has no regional wall motion abnormalities. Left ventricular diastolic parameters were normal.  2. Right ventricular systolic function is normal. The right ventricular size is normal. There is normal pulmonary artery systolic pressure.  3. The mitral valve is normal in structure. No evidence of mitral valve regurgitation. No evidence of mitral stenosis.  4. The aortic valve is tricuspid. Aortic valve regurgitation is not visualized. Moderate to severe aortic valve stenosis.  5. The inferior vena cava is normal in size with greater than 50% respiratory variability, suggesting right atrial pressure of 3 mmHg. FINDINGS  Left Ventricle: Left ventricular ejection fraction, by estimation, is 60 to 65%. The left ventricle has normal function. The left ventricle has no regional wall motion abnormalities. The left ventricular internal cavity size was normal in size. There is  no left ventricular hypertrophy. Left ventricular diastolic parameters were normal. Right Ventricle: The right ventricular size is normal. No increase in right ventricular wall thickness. Right ventricular systolic function is normal. There is normal pulmonary artery systolic pressure. The tricuspid regurgitant velocity is 2.35 m/s, and  with an assumed right atrial pressure of 3 mmHg, the  estimated right ventricular systolic pressure is 56.9 mmHg. Left Atrium: Left atrial size was normal in size. Right Atrium: Right atrial size was normal in size. Pericardium: There is no evidence of pericardial effusion. Mitral Valve: The mitral valve is normal in structure. No evidence of mitral valve regurgitation. No evidence of mitral valve stenosis. Tricuspid Valve: The tricuspid valve is normal in structure. Tricuspid valve regurgitation is trivial. No evidence of tricuspid stenosis. Aortic Valve: The aortic valve is tricuspid. Aortic  valve regurgitation is not visualized. Moderate to severe aortic stenosis is present. Pulmonic Valve: The pulmonic valve was normal in structure. Pulmonic valve regurgitation is trivial. No evidence of pulmonic stenosis. Aorta: The aortic root is normal in size and structure. Venous: The inferior vena cava is normal in size with greater than 50% respiratory variability, suggesting right atrial pressure of 3 mmHg. IAS/Shunts: No atrial level shunt detected by color flow Doppler.  LEFT VENTRICLE PLAX 2D LVIDd:         3.90 cm  Diastology LVIDs:         2.50 cm  LV e' medial:    8.38 cm/s LV PW:         0.80 cm  LV E/e' medial:  15.0 LV IVS:        1.00 cm  LV e' lateral:   16.90 cm/s LVOT diam:     1.90 cm  LV E/e' lateral: 7.4 LV SV:         81 LV SV Index:   41 LVOT Area:     2.84 cm                          3D Volume EF:                         3D EF:        67 %                         LV EDV:       151 ml                         LV ESV:       50 ml                         LV SV:        100 ml RIGHT VENTRICLE RV S prime:     17.40 cm/s TAPSE (M-mode): 3.6 cm LEFT ATRIUM             Index       RIGHT ATRIUM           Index LA diam:        3.60 cm 1.85 cm/m  RA Area:     14.10 cm LA Vol (A2C):   50.3 ml 25.83 ml/m RA Volume:   34.50 ml  17.72 ml/m LA Vol (A4C):   54.0 ml 27.73 ml/m LA Biplane Vol: 52.6 ml 27.01 ml/m  AORTIC VALVE LVOT Vmax:   121.00 cm/s LVOT Vmean:   71.400 cm/s LVOT VTI:    0.284 m  AORTA Ao Root diam: 2.70 cm MITRAL VALVE                TRICUSPID VALVE MV Area (PHT): cm          TR Peak grad:   22.1 mmHg MV Decel Time: 205 msec     TR Vmax:        235.00 cm/s MV E velocity: 125.50 cm/s MV A velocity: 92.90 cm/s   SHUNTS MV E/A ratio:  1.35         Systemic VTI:  0.28 m  Systemic Diam: 1.90 cm Skeet Latch MD Electronically signed by Skeet Latch MD Signature Date/Time: 01/05/2021/7:25:08 PM    Final     ASSESSMENT & PLAN Brooke Turner 45 y.o. female with medical history significant for iron deficiency anemia presents for a follow up visit.   After review the labs, the records, discussion with the patient the findings most consistent with persistent iron deficiency without anemia.  Her hemoglobin persistently remains above 12, however her ferritin levels and iron saturation levels are markedly low.  As such I would recommend that she continue p.o. iron therapy.  The treatment of iron deficiency without anemia is controversial, however given her symptoms I do believe it is reasonable to pursue p.o. iron therapy at this time.  In the event that this was ineffective and her symptoms fail to improve we could consider administration of IV iron, though without anemia it is difficult to justify.  I encouraged the patient to increase iron rich foods in her diet and continue the iron sulfate pill every other day with a source of vitamin C.  She voiced understanding of the plan moving forward.  # Iron Deficiency without Anemia 2/2 to GYN Bleeding --treatment of iron deficiency without anemia is controversial. Recommend that conitnue taking PO iron pills and try increasing the iron in her diet --continue iron sulfate 362m PO QOD with a source of vitamin C --no indication for IV iron at this time, though can be considered if symptoms fail to improve need labs failed to increase on PO iron therapy. --will check  full iron panel today.  --RTC in 3 months time or sooner if IV iron is required.   #Family History of Thalassemia --strong family history of thalassemia, though unclear if Alpha or Beta. Likely Beta based on the description --Hgb electrophoresis showed no evidence of beta thal or sickle cell trait. May still carry alpha thalassemia.  --even if test is positive, no intervention required --recommend genetic counseling if positive and patient is planning to become pregnant.  No orders of the defined types were placed in this encounter.   All questions were answered. The patient knows to call the clinic with any problems, questions or concerns.  A total of more than 30 minutes were spent on this encounter and over half of that time was spent on counseling and coordination of care as outlined above.   JLedell Peoples MD Department of Hematology/Oncology CDunellenat [facility]Well[facility] Fort WorthPhone: [phone]: [phone]: [email or web address]  01/13/2021 2:36 PM

## 2021-01-13 ENCOUNTER — Encounter: Payer: Self-pay | Admitting: Hematology and Oncology

## 2021-01-13 ENCOUNTER — Inpatient Hospital Stay (HOSPITAL_BASED_OUTPATIENT_CLINIC_OR_DEPARTMENT_OTHER): Payer: Managed Care, Other (non HMO) | Admitting: Hematology and Oncology

## 2021-01-13 ENCOUNTER — Inpatient Hospital Stay: Payer: Managed Care, Other (non HMO) | Attending: Hematology and Oncology

## 2021-01-13 ENCOUNTER — Other Ambulatory Visit: Payer: Self-pay

## 2021-01-13 VITALS — BP 144/84 | HR 76 | Temp 97.5°F | Resp 16 | Ht 64.0 in | Wt 201.1 lb

## 2021-01-13 DIAGNOSIS — Z832 Family history of diseases of the blood and blood-forming organs and certain disorders involving the immune mechanism: Secondary | ICD-10-CM | POA: Diagnosis not present

## 2021-01-13 DIAGNOSIS — Z833 Family history of diabetes mellitus: Secondary | ICD-10-CM | POA: Diagnosis not present

## 2021-01-13 DIAGNOSIS — E611 Iron deficiency: Secondary | ICD-10-CM | POA: Diagnosis present

## 2021-01-13 DIAGNOSIS — I10 Essential (primary) hypertension: Secondary | ICD-10-CM | POA: Diagnosis not present

## 2021-01-13 DIAGNOSIS — Z8249 Family history of ischemic heart disease and other diseases of the circulatory system: Secondary | ICD-10-CM | POA: Diagnosis not present

## 2021-01-13 DIAGNOSIS — Z87891 Personal history of nicotine dependence: Secondary | ICD-10-CM | POA: Diagnosis not present

## 2021-01-13 DIAGNOSIS — Z800 Family history of malignant neoplasm of digestive organs: Secondary | ICD-10-CM | POA: Insufficient documentation

## 2021-01-13 DIAGNOSIS — D500 Iron deficiency anemia secondary to blood loss (chronic): Secondary | ICD-10-CM

## 2021-01-13 DIAGNOSIS — Z79899 Other long term (current) drug therapy: Secondary | ICD-10-CM | POA: Insufficient documentation

## 2021-01-13 LAB — CMP (CANCER CENTER ONLY)
ALT: 15 U/L (ref 0–44)
AST: 16 U/L (ref 15–41)
Albumin: 3.7 g/dL (ref 3.5–5.0)
Alkaline Phosphatase: 72 U/L (ref 38–126)
Anion gap: 7 (ref 5–15)
BUN: 10 mg/dL (ref 6–20)
CO2: 27 mmol/L (ref 22–32)
Calcium: 8.1 mg/dL — ABNORMAL LOW (ref 8.9–10.3)
Chloride: 106 mmol/L (ref 98–111)
Creatinine: 1.13 mg/dL — ABNORMAL HIGH (ref 0.44–1.00)
GFR, Estimated: >60 mL/min (ref >60)
Glucose, Bld: 103 mg/dL — ABNORMAL HIGH (ref 70–99)
Potassium: 3.9 mmol/L (ref 3.5–5.1)
Sodium: 140 mmol/L (ref 135–145)
Total Bilirubin: 0.5 mg/dL (ref 0.3–1.2)
Total Protein: 7.2 g/dL (ref 6.5–8.1)

## 2021-01-13 LAB — IRON AND TIBC
Iron: 38 ug/dL — ABNORMAL LOW (ref 41–142)
Saturation Ratios: 8 % — ABNORMAL LOW (ref 21–57)
TIBC: 491 ug/dL — ABNORMAL HIGH (ref 236–444)
UIBC: 454 ug/dL — ABNORMAL HIGH (ref 120–384)

## 2021-01-13 LAB — CBC WITH DIFFERENTIAL (CANCER CENTER ONLY)
Abs Immature Granulocytes: 0 10*3/uL (ref 0.00–0.07)
Basophils Absolute: 0 10*3/uL (ref 0.0–0.1)
Basophils Relative: 0 %
Eosinophils Absolute: 0 10*3/uL (ref 0.0–0.5)
Eosinophils Relative: 1 %
HCT: 38 % (ref 36.0–46.0)
Hemoglobin: 12.5 g/dL (ref 12.0–15.0)
Immature Granulocytes: 0 %
Lymphocytes Relative: 56 %
Lymphs Abs: 3.6 10*3/uL (ref 0.7–4.0)
MCH: 28.6 pg (ref 26.0–34.0)
MCHC: 32.9 g/dL (ref 30.0–36.0)
MCV: 87 fL (ref 80.0–100.0)
Monocytes Absolute: 0.7 10*3/uL (ref 0.1–1.0)
Monocytes Relative: 11 %
Neutro Abs: 2.1 10*3/uL (ref 1.7–7.7)
Neutrophils Relative %: 32 %
Platelet Count: 254 10*3/uL (ref 150–400)
RBC: 4.37 MIL/uL (ref 3.87–5.11)
RDW: 13.6 % (ref 11.5–15.5)
WBC Count: 6.5 10*3/uL (ref 4.0–10.5)
nRBC: 0 /100 WBC

## 2021-01-13 LAB — RETIC PANEL
Immature Retic Fract: 10.6 % (ref 2.3–15.9)
RBC.: 4.3 MIL/uL (ref 3.87–5.11)
Retic Count, Absolute: 51.2 10*3/uL (ref 19.0–186.0)
Retic Ct Pct: 1.2 % (ref 0.4–3.1)
Reticulocyte Hemoglobin: 34.9 pg (ref >27.9)

## 2021-01-13 LAB — FERRITIN: Ferritin: 11 ng/mL (ref 11–307)

## 2021-01-14 ENCOUNTER — Telehealth: Payer: Self-pay | Admitting: Hematology and Oncology

## 2021-01-14 NOTE — Telephone Encounter (Signed)
Scheduled per los. Called and left msg. Mailed printout  °

## 2021-01-16 ENCOUNTER — Other Ambulatory Visit: Payer: Self-pay | Admitting: Cardiology

## 2021-01-19 ENCOUNTER — Telehealth: Payer: Self-pay | Admitting: *Deleted

## 2021-01-19 ENCOUNTER — Ambulatory Visit (INDEPENDENT_AMBULATORY_CARE_PROVIDER_SITE_OTHER): Payer: Managed Care, Other (non HMO) | Admitting: Neurology

## 2021-01-19 ENCOUNTER — Encounter: Payer: Self-pay | Admitting: Neurology

## 2021-01-19 ENCOUNTER — Other Ambulatory Visit: Payer: Self-pay

## 2021-01-19 VITALS — BP 134/82 | HR 78 | Ht 64.0 in | Wt 202.4 lb

## 2021-01-19 DIAGNOSIS — R42 Dizziness and giddiness: Secondary | ICD-10-CM | POA: Diagnosis not present

## 2021-01-19 DIAGNOSIS — G4485 Primary stabbing headache: Secondary | ICD-10-CM

## 2021-01-19 DIAGNOSIS — G5602 Carpal tunnel syndrome, left upper limb: Secondary | ICD-10-CM

## 2021-01-19 DIAGNOSIS — R2689 Other abnormalities of gait and mobility: Secondary | ICD-10-CM | POA: Diagnosis not present

## 2021-01-19 NOTE — Telephone Encounter (Signed)
I last saw her in 2020. Ok to make f/u appointment here is she wishes, otherwise, her PCP can send a prescription for an albuterol inhaler since her insurance won't cover levalbuterol.

## 2021-01-19 NOTE — Patient Instructions (Signed)
1. Continue to monitor headaches  2. Please call [facility] Phone: [phone]  to resume balance therapy for dizziness, if new referral is needed, please let us know and we can send another one  3. Follow-up in 6-8 months, call for any changes

## 2021-01-19 NOTE — Progress Notes (Signed)
 NEUROLOGY FOLLOW UP OFFICE NOTE  Brooke Turner [id number] 12/22/1975  HISTORY OF PRESENT ILLNESS: I had the pleasure of seeing Brooke Turner in follow-up in the neurology clinic on 01/19/2021.  The patient was last seen over a year ago for dizziness. Records and images were personally reviewed where available.  I personally reviewed MRI brain with and without contrast done 02/2020 which did not show any acute changes, there was note of several small white matter hyperintensities bilaterally, they are minimal and very small, patient reassured these would not cause any of her symptoms. EMG in 12/2019 showed mild left carpal tunnel syndrome. No cervical/lumbosacral radiculopathy on the left. She called in 07/2020 that she had trouble sleeping, waking up feeling short of breath with heart pounding, very disoriented. She would use the bathroom and take a long time to go back to sleep. She also reported headaches. She had a normal sleep study in 10/2020. She called on 2/24 that she felt very weak, achy, and tired. Her eye has been twitching. She had a blood vessel burst previously in the right eye. Headaches are over the frontal and temporal regions with sharp pain. She would press on her temples and this relieves the pressure. There is no associated nausea/vomiting, photo/phonophobia, visual obscurations. The sharp pain is really quick, likely something popped. Headaches can last 20 minutes off and on, she does not usually take prn medication for it. She feels dizzy/off balance, mostly when moving. For the past 3-4 days, she has had a spinning sensation when she is sitting up from sleeping. This has subsided a bit. She gets lightheaded when she looks too quickly and feels off balance daily. No falls. She continues to report tingling in her left hand and occasionally on both feet, L>R. She has a wrist brace. No weakness. She has back pain.     History on Initial Assessment 11/13/2019: This is a  pleasant 45 year old right-handed woman with a history of anxiety presenting for evaluation of dizziness. She was seen one time in our office in 2016 for headaches and left leg paresthesias. MRI brain in 2015 reportedly normal. She states today that the dizziness is a "minor part of it." She has seen Cardiology and has had an extensive normal workup, last recommendation was to go back to Neurology. She has had dizziness for a while, but last year was told she had vertigo when she had an episode while working out at the gym. She bent down and got very dizzy with a spinning sensation. She went to the ER where she recalls looking to the right provoking vertigo. She was given meclizine which she took for a week and the spinning went away mostly. She still has some vertigo when she tilts her head back or bends over. She would get a rush/pressure sensation and feel lightheaded. There is no headache but she states it feels like her head is going to pop. Sometimes she would be sitting and feel like her head is "blowing up" for a few seconds then resolves. Her main concern is balance. For the past few months, she would be standing at the counter and have to look down because it feels like the floor is moving and she is on a boat. She grabs on to things, no falls. She loses her grip and drops things with her left hand. She has occasional paresthesias in her left hand and both feet when standing/sitting for prolonged periods. She has back pain. When she wakes up,   she has noticed muscle spasms on her face, feeling like her face is stuck on the left side (no pain). She does not have a lot of headaches, no diplopia, hearing loss. She has occasional high-pitched tinnitus L>R. No head injuries or recent infections. No family history of similar symptoms.     PAST MEDICAL HISTORY: Past Medical History:  Diagnosis Date  . Anxiety   . Back pain   . Chest pain    a. 09/2014 Echo: EF 60-65%, no rwma, nl valves;  b. 12/2014 Neg  ETT.  . Essential hypertension    a. 01/2016 Renal duplex: no RAS- ? renal cyst (renal u/s 4/17 - no cyst, nl study); b. 02/2016 24 hr BP Monitor: Mean SBP ~ 138 with Max of 198. Highest pressures noted in evening hours following placement of cuff.  . Fibroid uterus   . GERD (gastroesophageal reflux disease)   . Palpitations    a. 09/2014 nl event monitor.    MEDICATIONS: Current Outpatient Medications on File Prior to Visit  Medication Sig Dispense Refill  . acetaminophen (TYLENOL) 500 MG tablet Take 500 mg by mouth every 6 (six) hours as needed (back pain/ cramps).     Marland Kitchen atenolol (TENORMIN) 25 MG tablet Take 1 tablet by mouth twice daily 180 tablet 3  . blood glucose meter kit and supplies Test blood sugar twice daily. Dx code: R73.09 1 each 0  . cholecalciferol (VITAMIN D3) 25 MCG (1000 UNIT) tablet Take 1,000 Units by mouth daily.    Marland Kitchen ELDERBERRY PO Take 1 capsule by mouth daily.    . famotidine (PEPCID) 20 MG tablet Take 1 tablet (20 mg total) by mouth 2 (two) times daily. 180 tablet 0  . ferrous sulfate 325 (65 FE) MG tablet Take 1 tablet (325 mg total) by mouth every other day. Please take with a source of Vitamin C 45 tablet 1  . fluticasone (FLONASE) 50 MCG/ACT nasal spray Place 2 sprays into both nostrils daily. (Patient taking differently: Place 2 sprays into both nostrils as needed (seasonal allergies).) 16 g 0  . glucose blood (ONETOUCH VERIO) test strip TEST BLOOD SUGAR DAILY 25 each 10  . levalbuterol (XOPENEX HFA) 45 MCG/ACT inhaler Inhale 1-2 puffs every 6 hours if needed- rescue inhaler 15 g 0  . levocetirizine (XYZAL) 5 MG tablet levocetirizine 5 mg tablet  TAKE 1 TABLET BY MOUTH AT BEDTIME FOR 2 WEEKS THEN AS NEEDED    . LORazepam (ATIVAN) 0.5 MG tablet Take 1 tablet (0.5 mg total) by mouth every 6 (six) hours as needed for anxiety. (Patient taking differently: Take 0.25 mg by mouth 3 (three) times daily as needed for anxiety.) 15 tablet 0  . meclizine (ANTIVERT) 25 MG  tablet as needed. prn    . Multiple Vitamins-Minerals (MULTIVITAMIN GUMMIES ADULT PO) Take 2 tablets by mouth daily.     . Norethindrone-Ethinyl Estradiol-Fe Biphas (LO LOESTRIN FE) 1 MG-10 MCG / 10 MCG tablet Take 1 tablet by mouth at bedtime.     [name] DELICA LANCETS 51O MISC USE TO TEST BLOOD SUGAR DAILY 100 each 3  . pantoprazole (PROTONIX) 40 MG tablet Take 1 tablet by mouth twice daily 60 tablet 6  . Peppermint Oil (IBGARD) 90 MG CPCR Use as directed 16 capsule 0  . sucralfate (CARAFATE) 1 g tablet Take 1 tablet (1 g total) by mouth 4 (four) times daily as needed. Make a slurry 30 tablet 0  . tranexamic acid (LYSTEDA) 650 MG TABS tablet tranexamic  acid 650 mg tablet  TAKE 2 TABLETS BY MOUTH THREE TIMES DAILY    . vitamin B-12 (CYANOCOBALAMIN) 1000 MCG tablet Take 1,000 mcg by mouth daily.      No current facility-administered medications on file prior to visit.    ALLERGIES: Allergies  Allergen Reactions  . Penicillins Hives    Has patient had a PCN reaction causing immediate rash, facial/tongue/throat swelling, SOB or lightheadedness with hypotension:Yes Has patient had a PCN reaction causing severe rash involving mucus membranes or skin necrosis:unsure Has patient had a PCN reaction that required hospitalization: Yes Has patient had a PCN reaction occurring within the last 10 years: No If all of the above answers are "NO", then may proceed with Cephalosporin use.     . Tamiflu [Oseltamivir Phosphate] Nausea And Vomiting    Excessive vomiting  . Tinidazole Other (See Comments)    Caused severe abd pain/type of colitis   . Diltiazem Other (See Comments)    Dizzy, felt like she was going to pass out Blood pressure went up per patient     FAMILY HISTORY: Family History  Problem Relation Age of Onset  . Seizures Mother   . Epilepsy Mother   . Cancer Father        Liver  . Diabetes Father 16  . Hypertension Father   . Heart disease Father 81       CEA, LE [place]   . Alzheimer's disease Maternal Grandmother   . Heart disease Maternal Grandfather   . Colon cancer Neg Hx   . Breast cancer Neg Hx     SOCIAL HISTORY: Social History   Socioeconomic History  . Marital status: Single    Spouse name: N/A  . Number of children: 0  . Years of education: 27  . Highest education level: Not on file  Occupational History  . Occupation: Research scientist (physical sciences): [employer]  Tobacco Use  . Smoking status: Former Smoker    Packs/day: 0.25    Years: 10.00    Pack years: 2.50    Types: Cigarettes    Start date: 06/25/2000    Quit date: 08/19/2014    Years since quitting: 6.4  . Smokeless tobacco: Never Used  Vaping Use  . Vaping Use: Never used  Substance and Sexual Activity  . Alcohol use: No    Alcohol/week: 0.0 standard drinks  . Drug use: No  . Sexual activity: Not Currently    Partners: Male    Birth control/protection: Pill  Other Topics Concern  . Not on file  Social History Narrative   Patient lives at home alone .   Patient works full time at [employer]   Education college.   Right handed.   Caffeine none   Social Determinants of Health   Financial Resource Strain: Not on file  Food Insecurity: Not on file  Transportation Needs: Not on file  Physical Activity: Not on file  Stress: Not on file  Social Connections: Not on file  Intimate Partner Violence: Not on file     PHYSICAL EXAM: Vitals:   01/19/21 1538  BP: 134/82  Pulse: 78  SpO2: 100%   General: No acute distress Head:  Normocephalic/atraumatic Skin/Extremities: No rash, no edema Neurological Exam: alert and awake. No aphasia or dysarthria. Fund of knowledge is appropriate.  Recent and remote memory are intact.  Attention and concentration are normal.   Cranial nerves: Pupils equal, round. Extraocular movements intact with no nystagmus. Visual fields full.  No facial asymmetry.  Motor: Bulk and tone normal, muscle strength 5/5 throughout with no  pronator drift.   Finger to nose testing intact.  Gait narrow-based and steady, able to tandem walk adequately.  Romberg negative.   IMPRESSION: This is a pleasant 44 yo RH woman with a history of anxiety who initially presented for dizziness. MRI brain no acute changes, patient reassured that the white matter changes seen are very, very minimal and would not cause any of her symptoms. She reports headaches suggestive of primary stabbing headaches. She would like to hold off on starting medication for now, we can consider gabapentin if headaches persist. Dizziness still appears positional, she will call Neurorehab to resume balance/vestibular therapy, gaze stabilization exercises. She has mild left carpal tunnel syndrome, continue using wrist brace. Follow-up in 6-8 months, she knows to call for any changes.    Thank you for allowing me to participate in her care.  Please do not hesitate to call for any questions or concerns.   Karen Aquino, M.D.   CC: Dr. Shaw   

## 2021-01-19 NOTE — Telephone Encounter (Signed)
Pt was last seen by CY on 10/13/2020 and no pending appts  Last refill of the Levalbuterol was 09/18/2020 with no refills.    pts insurance is not covering this medication at this time but the following medications are covered with out having to do a PA.  Please advise if any of the following medications can be used as a replacement:  Albuterol HFA  CY please advise. Thanks   KEY:  [id number] NAME: Brooke Turner DOB:  [id number]

## 2021-01-20 ENCOUNTER — Telehealth: Payer: Self-pay | Admitting: *Deleted

## 2021-01-20 NOTE — Telephone Encounter (Signed)
TCT patient regarding lab rsults. Spoke with patient and advised that she has persistent iron deficiency but is not anemic.  Informed that she is to continue her oral iron and that we will see her back in 3 months. Provided date and time for next appt.  Pt voiced understanding

## 2021-01-20 NOTE — Telephone Encounter (Signed)
-----   Message from Orson Slick, MD sent at 01/15/2021  2:11 PM EST ----- Please let Brooke Turner know that her labs show persistent iron deficiency, but no anemia. Encourage her to continue taking PO iron as prescribed. We will see her back in 3 months time or sooner if her symptoms worsen.  ----- Message ----- From: Buel Ream, Lab In [place] Sent: 01/13/2021  11:25 AM EST To: Orson Slick, MD

## 2021-01-22 ENCOUNTER — Encounter: Payer: Self-pay | Admitting: *Deleted

## 2021-01-22 ENCOUNTER — Ambulatory Visit: Payer: Managed Care, Other (non HMO) | Admitting: Hematology and Oncology

## 2021-01-22 ENCOUNTER — Other Ambulatory Visit: Payer: Managed Care, Other (non HMO)

## 2021-01-22 NOTE — Telephone Encounter (Signed)
My chart message sent to the pt.  Will sign off of this phone encounter

## 2021-01-29 ENCOUNTER — Emergency Department (HOSPITAL_COMMUNITY): Payer: Managed Care, Other (non HMO)

## 2021-01-29 ENCOUNTER — Encounter (HOSPITAL_COMMUNITY): Payer: Self-pay | Admitting: Emergency Medicine

## 2021-01-29 ENCOUNTER — Other Ambulatory Visit: Payer: Self-pay | Admitting: *Deleted

## 2021-01-29 ENCOUNTER — Other Ambulatory Visit: Payer: Self-pay

## 2021-01-29 ENCOUNTER — Telehealth: Payer: Self-pay

## 2021-01-29 ENCOUNTER — Emergency Department (HOSPITAL_COMMUNITY)
Admission: EM | Admit: 2021-01-29 | Discharge: 2021-01-30 | Disposition: A | Discharge status: Home / Self Care | Payer: Managed Care, Other (non HMO) | Attending: Emergency Medicine | Admitting: Emergency Medicine

## 2021-01-29 DIAGNOSIS — Z79899 Other long term (current) drug therapy: Secondary | ICD-10-CM | POA: Insufficient documentation

## 2021-01-29 DIAGNOSIS — R0789 Other chest pain: Secondary | ICD-10-CM

## 2021-01-29 DIAGNOSIS — I10 Essential (primary) hypertension: Secondary | ICD-10-CM | POA: Diagnosis not present

## 2021-01-29 DIAGNOSIS — E119 Type 2 diabetes mellitus without complications: Secondary | ICD-10-CM | POA: Diagnosis not present

## 2021-01-29 DIAGNOSIS — M79651 Pain in right thigh: Secondary | ICD-10-CM | POA: Diagnosis not present

## 2021-01-29 DIAGNOSIS — Z87891 Personal history of nicotine dependence: Secondary | ICD-10-CM | POA: Insufficient documentation

## 2021-01-29 DIAGNOSIS — M79604 Pain in right leg: Secondary | ICD-10-CM

## 2021-01-29 DIAGNOSIS — I83893 Varicose veins of bilateral lower extremities with other complications: Secondary | ICD-10-CM

## 2021-01-29 LAB — CBC WITH DIFFERENTIAL/PLATELET
Abs Immature Granulocytes: 0.01 10*3/uL (ref 0.00–0.07)
Basophils Absolute: 0 10*3/uL (ref 0.0–0.1)
Basophils Relative: 0 %
Eosinophils Absolute: 0.1 10*3/uL (ref 0.0–0.5)
Eosinophils Relative: 1 %
HCT: 39 % (ref 36.0–46.0)
Hemoglobin: 12.9 g/dL (ref 12.0–15.0)
Immature Granulocytes: 0 %
Lymphocytes Relative: 50 %
Lymphs Abs: 3.7 10*3/uL (ref 0.7–4.0)
MCH: 29.6 pg (ref 26.0–34.0)
MCHC: 33.1 g/dL (ref 30.0–36.0)
MCV: 89.4 fL (ref 80.0–100.0)
Monocytes Absolute: 0.7 10*3/uL (ref 0.1–1.0)
Monocytes Relative: 10 %
Neutro Abs: 2.9 10*3/uL (ref 1.7–7.7)
Neutrophils Relative %: 39 %
Platelets: 252 10*3/uL (ref 150–400)
RBC: 4.36 MIL/uL (ref 3.87–5.11)
RDW: 13.7 % (ref 11.5–15.5)
WBC: 7.4 10*3/uL (ref 4.0–10.5)
nRBC: 0 % (ref 0.0–0.2)

## 2021-01-29 LAB — BASIC METABOLIC PANEL
Anion gap: 7 (ref 5–15)
BUN: 12 mg/dL (ref 6–20)
CO2: 25 mmol/L (ref 22–32)
Calcium: 8.4 mg/dL — ABNORMAL LOW (ref 8.9–10.3)
Chloride: 106 mmol/L (ref 98–111)
Creatinine, Ser: 0.9 mg/dL (ref 0.44–1.00)
GFR, Estimated: >60 mL/min (ref >60)
Glucose, Bld: 95 mg/dL (ref 70–99)
Potassium: 3.7 mmol/L (ref 3.5–5.1)
Sodium: 138 mmol/L (ref 135–145)

## 2021-01-29 LAB — TROPONIN I (HIGH SENSITIVITY): Troponin I (High Sensitivity): 2 ng/L (ref <18)

## 2021-01-29 MED ORDER — ENOXAPARIN SODIUM 100 MG/ML ~~LOC~~ SOLN
1.0000 mg/kg | Freq: Once | SUBCUTANEOUS | Status: AC
  Administered 2021-01-30: 90 mg via SUBCUTANEOUS
  Filled 2021-01-29: qty 0.9

## 2021-01-29 MED ORDER — IOHEXOL 350 MG/ML SOLN
100.0000 mL | Freq: Once | INTRAVENOUS | Status: AC | PRN
  Administered 2021-01-29: 100 mL via INTRAVENOUS

## 2021-01-29 NOTE — ED Triage Notes (Signed)
Patient reports throbbing right leg pain with swelling x2 days. Denies injury. Works from home sitting for long periods of time and takes oral contraceptive. Strong pedal pulses.

## 2021-01-29 NOTE — ED Provider Notes (Signed)
Blood pressure 139/86, pulse 64, temperature 98.8 F (37.1 C), temperature source Oral, resp. rate 18, height 5\' 4"  (1.626 m), weight 90.7 kg, SpO2 100 %.  Assuming care from Dr. Dina Rich.  In short, Brooke Turner is a 45 y.o. female with a chief complaint of Leg Pain .  Refer to the original H&P for additional details.  The current plan of care is to follow up .  Studies resulted and Dr. Dina Rich discharged from the ED.    Margette Fast, MD 01/30/21 2033

## 2021-01-29 NOTE — Telephone Encounter (Signed)
Pt called triage line; returned her call and left message to call us back.

## 2021-01-29 NOTE — Telephone Encounter (Signed)
Pt called this afternoon with c/o R leg/foot tenderness and possible swelling x "few days". She denies any injury to the area. She had experienced what sounded similar to this in her L leg when she was last here in 2020. Pt has been advised to report to ED if she experiences pain, swelling, redness/warmth. She has been scheduled for u/s Monday and will return Thursday to see MD. Pt verbalized understanding; no further questions at this time.

## 2021-01-29 NOTE — ED Provider Notes (Addendum)
[facility] DEPT Provider Note   CSN: [id number] Arrival date & time: 01/29/21  1953     History Chief Complaint  Patient presents with  . Leg Pain    Brooke Turner is a 45 y.o. female.  HPI     This 45 year old female with a history of hypertension and reflux who presents with right lower extremity pain, swelling, and intermittent chest discomfort.  Patient reports that over the last several days she has noted pain in her right thigh.  She denies any injury.  She states she does power walk but has been doing so for some time.  She states that she feels like her right thigh is slightly more swollen.  She does sit for prolonged periods of time while working.  No known history of blood clots but is on low-dose hormonal birth control.  She states that over the last 2 to 3 days she is also noted some burning chest discomfort.  She normally takes acid reflux medication but states that that has not really helped.  The pain is not pleuritic in nature.  She rates her pain currently at 2 out of 10.  She has seen a vascular clinic before and they encouraged her to come to the ED to rule out blood clot.  She denies any significant shortness of breath.  No fevers.  Past Medical History:  Diagnosis Date  . Anxiety   . Back pain   . Chest pain    a. 09/2014 Echo: EF 60-65%, no rwma, nl valves;  b. 12/2014 Neg ETT.  . Essential hypertension    a. 01/2016 Renal duplex: no RAS- ? renal cyst (renal u/s 4/17 - no cyst, nl study); b. 02/2016 24 hr BP Monitor: Mean SBP ~ 138 with Max of 198. Highest pressures noted in evening hours following placement of cuff.  . Fibroid uterus   . GERD (gastroesophageal reflux disease)   . Palpitations    a. 09/2014 nl event monitor.    Patient Active Problem List   Diagnosis Date Noted  . Gastroesophageal reflux disease without esophagitis 06/03/2020  . Type 2 diabetes mellitus without complication, without long-term  current use of insulin ([place]) 06/03/2020  . Chest discomfort 01/03/2020  . Pounding heartbeat 10/25/2019  . Costochondral junction syndrome 10/02/2019  . OSA (obstructive sleep apnea) 06/11/2019  . Encounter for examination following treatment at hospital 04/25/2019  . Hypokalemia 04/25/2019  . Elevated glucose 04/25/2019  . History of posttraumatic stress disorder (PTSD) 12/31/2018  . History of palpitations 12/31/2018  . Contusion of left knee 07/06/2018  . Chronic tension-type headache, not intractable 06/10/2015  . Dyspnea 03/18/2015  . Post-traumatic stress reaction 02/25/2015  . Anxiety disorder due to general medical condition with panic attack 02/25/2015  . Fibroid uterus 02/25/2015  . Anxiety about health 02/25/2015  . Deviated nasal septum 12/30/2014  . Chronic rhinitis 12/30/2014  . Chronic maxillary sinusitis 12/30/2014  . Snorings 09/30/2014  . Other chest pain 09/11/2014  . Prediabetes 08/26/2014  . Acanthosis nigricans 03/19/2014  . Vitamin D deficiency 07/15/2013  . Large breasts 09/12/2012  . Essential hypertension 02/17/2012  . Obesity (BMI 30-39.9) previous BMI was over 40. Patient has lost 70 pounds in the last year 02/17/2012  . GAD (generalized anxiety disorder) 02/17/2012  . Chronic back pain 02/17/2012    Past Surgical History:  Procedure Laterality Date  . ESOPHAGOGASTRODUODENOSCOPY  01/2020  . WISDOM TOOTH EXTRACTION       OB History  Gravida  2   Para  0   Term  0   Preterm  0   AB  2   Living  0     SAB  1   IAB  1   Ectopic  0   Multiple  0   Live Births              Family History  Problem Relation Age of Onset  . Seizures Mother   . Epilepsy Mother   . Cancer Father        Liver  . Diabetes Father 48  . Hypertension Father   . Heart disease Father 53       CEA, LE [place]  . Alzheimer's disease Maternal Grandmother   . Heart disease Maternal Grandfather   . Colon cancer Neg Hx   . Breast cancer Neg Hx      Social History   Tobacco Use  . Smoking status: Former Smoker    Packs/day: 0.25    Years: 10.00    Pack years: 2.50    Types: Cigarettes    Start date: 06/25/2000    Quit date: 08/19/2014    Years since quitting: 6.4  . Smokeless tobacco: Never Used  Vaping Use  . Vaping Use: Never used  Substance Use Topics  . Alcohol use: No    Alcohol/week: 0.0 standard drinks  . Drug use: No    Home Medications Prior to Admission medications   Medication Sig Start Date End Date Taking? Authorizing Provider  acetaminophen (TYLENOL) 500 MG tablet Take 500 mg by mouth every 6 (six) hours as needed (back pain/ cramps).     [provider]  atenolol (TENORMIN) 25 MG tablet Take 1 tablet by mouth twice daily Patient taking differently: 1/2 tab in the morning and 1/4 at night 01/18/21   Minus Breeding, MD  blood glucose meter kit and supplies Test blood sugar twice daily. Dx code: R73.09 04/25/19   Maryruth Hancock, MD  cholecalciferol (VITAMIN D3) 25 MCG (1000 UNIT) tablet Take 1,000 Units by mouth daily.    [provider]  ELDERBERRY PO Take 1 capsule by mouth daily.    [provider]  famotidine (PEPCID) 20 MG tablet Take 1 tablet (20 mg total) by mouth 2 (two) times daily. 11/20/18   [name], MD  ferrous sulfate 325 (65 FE) MG tablet Take 1 tablet (325 mg total) by mouth every other day. Please take with a source of Vitamin C 06/04/20   Orson Slick, MD  fluticasone [facility]) 50 MCG/ACT nasal spray Place 2 sprays into both nostrils daily. Patient taking differently: Place 2 sprays into both nostrils as needed (seasonal allergies). 10/10/17   Tenna Delaine D, PA-C  glucose blood (ONETOUCH VERIO) test strip TEST BLOOD SUGAR DAILY 09/02/19   Forrest Moron, MD  levalbuterol [facility] HFA) 45 MCG/ACT inhaler Inhale 1-2 puffs every 6 hours if needed- rescue inhaler 09/18/20   Baird Lyons D, MD  levocetirizine (XYZAL) 5 MG tablet levocetirizine 5 mg tablet   TAKE 1 TABLET BY MOUTH AT BEDTIME FOR 2 WEEKS THEN AS NEEDED    [provider]  LORazepam (ATIVAN) 0.5 MG tablet Take 1 tablet (0.5 mg total) by mouth every 6 (six) hours as needed for anxiety. Patient taking differently: Take 0.25 mg by mouth 3 (three) times daily as needed for anxiety. 03/19/19   Forrest Moron, MD  meclizine (ANTIVERT) 25 MG tablet as needed. prn  [provider]  Multiple Vitamins-Minerals (MULTIVITAMIN GUMMIES ADULT PO) Take 2 tablets by mouth daily.     [provider]  Norethindrone-Ethinyl Estradiol-Fe Biphas (LO LOESTRIN FE) 1 MG-10 MCG / 10 MCG tablet Take 1 tablet by mouth at bedtime.     [provider]  [facility] DELICA LANCETS 86L MISC USE TO TEST BLOOD SUGAR DAILY 11/16/18   [name], MD  pantoprazole (PROTONIX) 40 MG tablet Take 1 tablet by mouth twice daily 06/09/20   Levin Erp, PA  Peppermint Oil (IBGARD) 90 MG CPCR Use as directed Patient not taking: Reported on 01/19/2021 06/04/20   Thornton Park, MD  sucralfate (CARAFATE) 1 g tablet Take 1 tablet (1 g total) by mouth 4 (four) times daily as needed. Make a slurry Patient not taking: Reported on 01/19/2021 08/10/20   Thornton Park, MD  tranexamic acid (LYSTEDA) 650 MG TABS tablet tranexamic acid 650 mg tablet  TAKE 2 TABLETS BY MOUTH THREE TIMES DAILY Patient not taking: No sig reported    [provider]  vitamin B-12 (CYANOCOBALAMIN) 1000 MCG tablet Take 1,000 mcg by mouth daily.     [provider]    Allergies    Penicillins, Tamiflu [oseltamivir phosphate], Tinidazole, and Diltiazem  Review of Systems   Review of Systems  Constitutional: Negative for fever.  Respiratory: Negative for shortness of breath.   Cardiovascular: Positive for chest pain and leg swelling. Negative for palpitations.  Gastrointestinal: Negative for abdominal pain, nausea and vomiting.  Genitourinary: Negative for dysuria.  All other systems reviewed and  are negative.   Physical Exam Updated Vital Signs BP 139/86   Pulse 64   Temp 98.8 F (37.1 C) (Oral)   Resp 18   Ht 1.626 m (_0 )   Wt 90.7 kg   SpO2 100%   BMI 34.33 kg/m   Physical Exam Vitals and nursing note reviewed.  Constitutional:      Appearance: She is well-developed. She is obese. She is not ill-appearing.  HENT:     Head: Normocephalic and atraumatic.     Mouth/Throat:     Mouth: Mucous membranes are moist.  Eyes:     Pupils: Pupils are equal, round, and reactive to light.  Cardiovascular:     Rate and Rhythm: Normal rate and regular rhythm.     Heart sounds: Normal heart sounds.  Pulmonary:     Effort: Pulmonary effort is normal. No respiratory distress.     Breath sounds: No wheezing.  Abdominal:     Palpations: Abdomen is soft.     Tenderness: There is no abdominal tenderness.  Musculoskeletal:     Cervical back: Neck supple.     Right lower leg: No edema.     Left lower leg: No edema.     Comments: No objective asymmetric lower extremity edema, tenderness to palpation right thigh, no overlying skin changes or bruising noted  Skin:    General: Skin is warm and dry.  Neurological:     Mental Status: She is alert and oriented to person, place, and time.  Psychiatric:        Mood and Affect: Mood normal.     ED Results / Procedures / Treatments   Labs (all labs ordered are listed, but only abnormal results are displayed) Labs Reviewed  BASIC METABOLIC PANEL - Abnormal; Notable for the following components:      Result Value   Calcium 8.4 (*)    All other components within normal  limits  CBC WITH DIFFERENTIAL/PLATELET  TROPONIN I (HIGH SENSITIVITY)  TROPONIN I (HIGH SENSITIVITY)    EKG EKG Interpretation  Date/Time:  Friday January 29 2021 21:25:07 EDT Ventricular Rate:  74 PR Interval:    QRS Duration: 103 QT Interval:  413 QTC Calculation: 459 R Axis:   15 Text Interpretation: Sinus rhythm Low voltage, precordial leads Confirmed by  Thayer Jew [phone]) on 01/29/2021 9:27:21 PM   Radiology DG Chest 2 View  Result Date: 01/29/2021 CLINICAL DATA:  Chest pain EXAM: CHEST - 2 VIEW COMPARISON:  01/31/2020 FINDINGS: The heart size and mediastinal contours are within normal limits. Both lungs are clear. The visualized skeletal structures are unremarkable. IMPRESSION: No acute abnormality of the lungs. Electronically Signed   By: Eddie Candle M.D.   On: 01/29/2021 21:46   CT Angio Chest PE W and/or Wo Contrast  Result Date: 01/29/2021 CLINICAL DATA:  PE suspected, high prob Right leg pain and swelling. EXAM: CT ANGIOGRAPHY CHEST WITH CONTRAST TECHNIQUE: Multidetector CT imaging of the chest was performed using the standard protocol during bolus administration of intravenous contrast. Multiplanar CT image reconstructions and MIPs were obtained to evaluate the vascular anatomy. CONTRAST:  124m OMNIPAQUE IOHEXOL 350 MG/ML SOLN COMPARISON:  Radiograph earlier today. Chest CTA 03/09/2019, chest CT 10/30/2018 also reviewed FINDINGS: Cardiovascular: Suboptimal evaluation for pulmonary embolus due to contrast bolus timing and soft tissue attenuation from habitus. No evidence of central pulmonary arterial in filling defects to the distal lobar and proximal segmental level. The distal segmental and subsegmental branches are not well assessed due to contrast bolus timing. Normal caliber thoracic aorta. Upper normal heart size. No pericardial effusion. Mediastinum/Nodes: No enlarged mediastinal or hilar lymph nodes. No thyroid nodule. Patulous esophagus with small hiatal hernia. There are few prominent left axillary nodes are not enlarged by size criteria. Lungs/Pleura: No acute airspace disease, pleural fluid, or pulmonary edema. Stable tiny right upper lobe pulmonary nodules, series 10, image 60 and right middle lobe nodule series 10, image 88. No new or progressive nodules. Upper Abdomen: No acute or unexpected finding. Musculoskeletal: There are  no acute or suspicious osseous abnormalities. Review of the MIP images confirms the above findings. IMPRESSION: 1. No evidence of central pulmonary arterial filling defects to the distal lobar and proximal segmental level. The distal segmental and subsegmental branches are not well assessed due to contrast bolus timing. 2. No acute intrathoracic abnormality. 3. Stable tiny right lung nodules from 2019, considered benign. 4. Patulous esophagus with small hiatal hernia. Electronically Signed   By: MKeith RakeM.D.   On: 01/29/2021 23:40    Procedures Procedures   Medications Ordered in ED Medications  iohexol (OMNIPAQUE) 350 MG/ML injection 100 mL (100 mLs Intravenous Contrast Given 01/29/21 2303)    ED Course  I have reviewed the triage vital signs and the nursing notes.  Pertinent labs & imaging results that were available during my care of the patient were reviewed by me and considered in my medical decision making (see chart for details).    MDM Rules/Calculators/A&P                          She presents with right leg pain.  Also has some atypical chest discomfort but it is not improving with her reflux medications.  I cannot appreciate any objective asymmetric swelling of the lower extremities and she is neurovascularly intact.  EKG without acute ischemic changes.  Chest x-ray is reassuring.  Basic  lab work including 1 troponin is negative.  Will obtain CT of the chest to rule out PE as I feel she is too high risk for a D-dimer and I cannot evaluate for a lower extremity DVT.  Patient signed out to oncoming provider.  His CT is negative.  Will give Lovenox and have her follow-up for outpatient ultrasound imaging. Final Clinical Impression(s) / ED Diagnoses Final diagnoses:  Right leg pain  Atypical chest pain    Rx / DC Orders ED Discharge Orders         Ordered    LE VENOUS        01/29/21 2340           Merryl Hacker, MD 01/29/21 1954    Merryl Hacker,  MD 01/29/21 [phone]

## 2021-01-29 NOTE — Discharge Instructions (Signed)
You were seen today with leg swelling and concern for some atypical chest pain.  Your CT scan does not show a blood clot.  I have ordered a scan of your right leg.  Follow instructions for follow-up.

## 2021-01-30 ENCOUNTER — Ambulatory Visit (HOSPITAL_BASED_OUTPATIENT_CLINIC_OR_DEPARTMENT_OTHER)
Admission: RE | Admit: 2021-01-30 | Discharge: 2021-01-30 | Disposition: A | Discharge status: Home / Self Care | Payer: Managed Care, Other (non HMO) | Source: Ambulatory Visit | Attending: Emergency Medicine | Admitting: Emergency Medicine

## 2021-01-30 DIAGNOSIS — M7989 Other specified soft tissue disorders: Secondary | ICD-10-CM

## 2021-01-30 NOTE — Progress Notes (Signed)
Lower extremity venous has been completed.   Preliminary results in CV Proc.   Abram Sander 01/30/2021 11:51 AM

## 2021-02-01 ENCOUNTER — Ambulatory Visit (HOSPITAL_COMMUNITY)
Admission: RE | Admit: 2021-02-01 | Discharge: 2021-02-01 | Disposition: A | Discharge status: Home / Self Care | Payer: Managed Care, Other (non HMO) | Source: Ambulatory Visit | Attending: Vascular Surgery | Admitting: Vascular Surgery

## 2021-02-01 ENCOUNTER — Other Ambulatory Visit: Payer: Self-pay

## 2021-02-01 DIAGNOSIS — I83893 Varicose veins of bilateral lower extremities with other complications: Secondary | ICD-10-CM | POA: Diagnosis present

## 2021-02-04 ENCOUNTER — Ambulatory Visit: Payer: Managed Care, Other (non HMO) | Admitting: Vascular Surgery

## 2021-02-10 ENCOUNTER — Encounter: Payer: Self-pay | Admitting: Vascular Surgery

## 2021-02-20 ENCOUNTER — Other Ambulatory Visit: Payer: Self-pay | Admitting: Physician Assistant

## 2021-02-23 ENCOUNTER — Telehealth: Payer: Self-pay | Admitting: Gastroenterology

## 2021-02-23 ENCOUNTER — Other Ambulatory Visit: Payer: Self-pay | Admitting: Physician Assistant

## 2021-02-23 MED ORDER — PANTOPRAZOLE SODIUM 40 MG PO TBEC: 1.0000 | DELAYED_RELEASE_TABLET | Freq: Two times a day (BID) | ORAL | 6 refills | Status: DC

## 2021-02-23 NOTE — Telephone Encounter (Signed)
Refill has been sent in.  

## 2021-02-23 NOTE — Telephone Encounter (Signed)
Inbound patient call from patient needing medication refill for pantoprazole. [facility] [address]

## 2021-03-01 ENCOUNTER — Telehealth: Payer: Self-pay | Admitting: Cardiology

## 2021-03-01 ENCOUNTER — Telehealth: Payer: Self-pay | Admitting: Physician Assistant

## 2021-03-01 NOTE — Telephone Encounter (Signed)
Pt was calling to reschedule appointment on 4/28. Advised pt first available was not until 5/13. Pt state she will just keep appointment for 4/28.

## 2021-03-01 NOTE — Telephone Encounter (Signed)
Patient called to see whats the reason for her 3 months appt on the 4/28. Please advise

## 2021-03-01 NOTE — Telephone Encounter (Signed)
Patient saw J. Cleaver NP in January, scheduled for 21m f/u.    Patient aware and will call back if she needs to reschedule.

## 2021-03-01 NOTE — Telephone Encounter (Signed)
Patient is calling and wants to check to see if they can do an in-person on the 03/12/21 or 03/16/21

## 2021-03-03 NOTE — Progress Notes (Signed)
Cardiology Office Note   Date:  03/04/2021   ID:  Brooke Turner, DOB [birth year], MRN [id number]  PCP:  [name], MD  Cardiologist:   Minus Breeding, MD   Chief Complaint  Patient presents with  . Chest Pain      History of Present Illness: Brooke Turner is a 45 y.o. female who presents for evaluation of chest pain and palpitations.   She echocardiogram in March.  She had a CT without PE.  She has a small hiatal hernia.  Since I last saw her she is continue to have occasional chest pain.  She continues to have occasional palpitations.  She had her LP(a) measured and it was 56.  However, she is doing relatively well.  She started exercising a little more.  She is not having any new chest pressure, neck or arm discomfort.  She is not having any new shortness of breath, PND or orthopnea.  She had no presyncope or syncope.   Past Medical History:  Diagnosis Date  . Anxiety   . Back pain   . Chest pain    a. 09/2014 Echo: EF 60-65%, no rwma, nl valves;  b. 12/2014 Neg ETT.  . Essential hypertension    a. 01/2016 Renal duplex: no RAS- ? renal cyst (renal u/s 4/17 - no cyst, nl study); b. 02/2016 24 hr BP Monitor: Mean SBP ~ 138 with Max of 198. Highest pressures noted in evening hours following placement of cuff.  . Fibroid uterus   . GERD (gastroesophageal reflux disease)   . Palpitations    a. 09/2014 nl event monitor.    Past Surgical History:  Procedure Laterality Date  . ESOPHAGOGASTRODUODENOSCOPY  01/2020  . WISDOM TOOTH EXTRACTION       Current Outpatient Medications  Medication Sig Dispense Refill  . acetaminophen (TYLENOL) 500 MG tablet Take 500 mg by mouth every 6 (six) hours as needed (back pain/ cramps).     Marland Kitchen atenolol (TENORMIN) 25 MG tablet Take 1 tablet by mouth twice daily (Patient taking differently: 1/2 tab in the morning and 1/4 at night) 180 tablet 3  . blood glucose meter kit and supplies Test blood sugar twice daily. Dx code:  R73.09 1 each 0  . cholecalciferol (VITAMIN D3) 25 MCG (1000 UNIT) tablet Take 1,000 Units by mouth daily.    Marland Kitchen ELDERBERRY PO Take 1 capsule by mouth daily.    . famotidine (PEPCID) 20 MG tablet Take 1 tablet (20 mg total) by mouth 2 (two) times daily. 180 tablet 0  . ferrous sulfate 325 (65 FE) MG tablet Take 1 tablet (325 mg total) by mouth every other day. Please take with a source of Vitamin C 45 tablet 1  . fluticasone (FLONASE) 50 MCG/ACT nasal spray Place 2 sprays into both nostrils daily. (Patient taking differently: Place 2 sprays into both nostrils as needed (seasonal allergies).) 16 g 0  . glucose blood (ONETOUCH VERIO) test strip TEST BLOOD SUGAR DAILY 25 each 10  . levalbuterol (XOPENEX HFA) 45 MCG/ACT inhaler Inhale 1-2 puffs every 6 hours if needed- rescue inhaler 15 g 0  . levocetirizine (XYZAL) 5 MG tablet levocetirizine 5 mg tablet  TAKE 1 TABLET BY MOUTH AT BEDTIME FOR 2 WEEKS THEN AS NEEDED    . LORazepam (ATIVAN) 0.5 MG tablet Take 1 tablet (0.5 mg total) by mouth every 6 (six) hours as needed for anxiety. (Patient taking differently: Take 0.25 mg by mouth 3 (three) times daily as  needed for anxiety.) 15 tablet 0  . meclizine (ANTIVERT) 25 MG tablet as needed. prn    . Multiple Vitamins-Minerals (MULTIVITAMIN GUMMIES ADULT PO) Take 2 tablets by mouth daily.     . nitroGLYCERIN (NITROSTAT) 0.4 MG SL tablet Place 1 tablet (0.4 mg total) under the tongue every 5 (five) minutes as needed for chest pain. 25 tablet 3  . Norethindrone-Ethinyl Estradiol-Fe Biphas (LO LOESTRIN FE) 1 MG-10 MCG / 10 MCG tablet Take 1 tablet by mouth at bedtime.     [name] DELICA LANCETS 49Q MISC USE TO TEST BLOOD SUGAR DAILY 100 each 3  . pantoprazole (PROTONIX) 40 MG tablet Take 1 tablet (40 mg total) by mouth 2 (two) times daily. 60 tablet 6  . Peppermint Oil (IBGARD) 90 MG CPCR Use as directed 16 capsule 0  . sucralfate (CARAFATE) 1 g tablet Take 1 tablet (1 g total) by mouth 4 (four) times daily  as needed. Make a slurry 30 tablet 0  . vitamin B-12 (CYANOCOBALAMIN) 1000 MCG tablet Take 1,000 mcg by mouth daily.      No current facility-administered medications for this visit.    Allergies:   Penicillins, Tamiflu [oseltamivir phosphate], Tinidazole, and Diltiazem    ROS:  Please see the history of present illness.   Otherwise, review of systems are positive for none.   All other systems are reviewed and negative.    PHYSICAL EXAM: VS:  BP (!) 146/72   Pulse 64   Ht _0  (1.626 m)   Wt 203 lb 12.8 oz (92.4 kg)   SpO2 99%   BMI 34.98 kg/m  , BMI Body mass index is 34.98 kg/m. GENERAL:  Well appearing NECK:  No jugular venous distention, waveform within normal limits, carotid upstroke brisk and symmetric, no bruits, no thyromegaly LUNGS:  Clear to auscultation bilaterally CHEST:  Unremarkable HEART:  PMI not displaced or sustained,S1 and S2 within normal limits, no S3, no S4, no clicks, no rubs, no murmurs ABD:  Flat, positive bowel sounds normal in frequency in pitch, no bruits, no rebound, no guarding, no midline pulsatile mass, no hepatomegaly, no splenomegaly EXT:  2 plus pulses throughout, no edema, no cyanosis no clubbing   EKG:  EKG is not ordered today.    Recent Labs: 01/13/2021: ALT 15 01/29/2021: BUN 12; Creatinine, Ser 0.90; Hemoglobin 12.9; Platelets 252; Potassium 3.7; Sodium 138    Lipid Panel    Component Value Date/Time   CHOL 169 08/25/2020 1221   TRIG 86 08/25/2020 1221   HDL 51 08/25/2020 1221   CHOLHDL 3.3 08/25/2020 1221   LDLCALC 102 (H) 08/25/2020 1221   LDLDIRECT 101 (H) 10/25/2016 1850      Wt Readings from Last 3 Encounters:  03/04/21 203 lb 12.8 oz (92.4 kg)  01/29/21 200 lb (90.7 kg)  01/19/21 202 lb 6.4 oz (91.8 kg)      Other studies Reviewed: Additional studies/ records that were reviewed today include: Labs from [facility] Review of the above records demonstrates:  Please see elsewhere in the note.     ASSESSMENT AND  PLAN:  CHEST PAIN:    She has had no change in her symptoms.  She has had an extensive work-up in the past with 0 coronary calcium 2020.  No change in therapy or further testing.  She would like some sublingual nitroglycerin and since this might help with nonanginal chest pain I do not think is unreasonable for her to try.   PALPITATIONS: These are baseline.  She has had very rare ectopy on the monitor in the past.  No further evaluation is necessary.    Current medicines are reviewed at length with the patient today.  The patient does not have concerns regarding medicines.  The following changes have been made: As above  Labs/ tests ordered today include: None No orders of the defined types were placed in this encounter.    Disposition:   FU with me 1 year   Signed, Minus Breeding, MD  03/04/2021 5:25 PM    [facility]

## 2021-03-04 ENCOUNTER — Other Ambulatory Visit: Payer: Self-pay

## 2021-03-04 ENCOUNTER — Ambulatory Visit (INDEPENDENT_AMBULATORY_CARE_PROVIDER_SITE_OTHER): Payer: Managed Care, Other (non HMO) | Admitting: Cardiology

## 2021-03-04 ENCOUNTER — Encounter: Payer: Self-pay | Admitting: Cardiology

## 2021-03-04 VITALS — BP 146/72 | HR 64 | Ht 64.0 in | Wt 203.8 lb

## 2021-03-04 DIAGNOSIS — R079 Chest pain, unspecified: Secondary | ICD-10-CM | POA: Diagnosis not present

## 2021-03-04 MED ORDER — NITROGLYCERIN 0.4 MG SL SUBL: 0.4000 mg | SUBLINGUAL_TABLET | SUBLINGUAL | 3 refills | Status: DC | PRN

## 2021-03-04 NOTE — Patient Instructions (Signed)
Medication Instructions:  Continue current medications  *If you need a refill on your cardiac medications before your next appointment, please call your pharmacy*   Lab Work: None Ordered   Testing/Procedures: None Ordered   Follow-Up: At [facility], you and your health needs are our priority.  As part of our continuing mission to provide you with exceptional heart care, we have created designated Provider Care Teams.  These Care Teams include your primary Cardiologist (physician) and Advanced Practice Providers (APPs -  Physician Assistants and Nurse Practitioners) who all work together to provide you with the care you need, when you need it.  We recommend signing up for the patient portal called "MyChart".  Sign up information is provided on this After Visit Summary.  MyChart is used to connect with patients for Virtual Visits (Telemedicine).  Patients are able to view lab/test results, encounter notes, upcoming appointments, etc.  Non-urgent messages can be sent to your provider as well.   To learn more about what you can do with MyChart, go to [email or web address].    Your next appointment:   1 year(s)  The format for your next appointment:   In Person  Provider:   You may see James Hochrein, MD or one of the following Advanced Practice Providers on your designated Care Team:    Rhonda Barrett, PA-C  Kathryn Lawrence, DNP, ANP     

## 2021-03-16 ENCOUNTER — Emergency Department (HOSPITAL_COMMUNITY): Payer: Managed Care, Other (non HMO)

## 2021-03-16 ENCOUNTER — Other Ambulatory Visit: Payer: Self-pay

## 2021-03-16 ENCOUNTER — Encounter (HOSPITAL_COMMUNITY): Payer: Self-pay

## 2021-03-16 ENCOUNTER — Emergency Department (HOSPITAL_COMMUNITY)
Admission: EM | Admit: 2021-03-16 | Discharge: 2021-03-16 | Disposition: A | Discharge status: Home / Self Care | Payer: Managed Care, Other (non HMO) | Attending: Emergency Medicine | Admitting: Emergency Medicine

## 2021-03-16 DIAGNOSIS — M546 Pain in thoracic spine: Secondary | ICD-10-CM | POA: Insufficient documentation

## 2021-03-16 DIAGNOSIS — R1010 Upper abdominal pain, unspecified: Secondary | ICD-10-CM | POA: Insufficient documentation

## 2021-03-16 DIAGNOSIS — Z87891 Personal history of nicotine dependence: Secondary | ICD-10-CM | POA: Diagnosis not present

## 2021-03-16 DIAGNOSIS — R200 Anesthesia of skin: Secondary | ICD-10-CM | POA: Insufficient documentation

## 2021-03-16 DIAGNOSIS — I10 Essential (primary) hypertension: Secondary | ICD-10-CM | POA: Diagnosis not present

## 2021-03-16 DIAGNOSIS — R0602 Shortness of breath: Secondary | ICD-10-CM | POA: Diagnosis not present

## 2021-03-16 DIAGNOSIS — E860 Dehydration: Secondary | ICD-10-CM | POA: Insufficient documentation

## 2021-03-16 DIAGNOSIS — Z79899 Other long term (current) drug therapy: Secondary | ICD-10-CM | POA: Insufficient documentation

## 2021-03-16 DIAGNOSIS — E119 Type 2 diabetes mellitus without complications: Secondary | ICD-10-CM | POA: Insufficient documentation

## 2021-03-16 DIAGNOSIS — R079 Chest pain, unspecified: Secondary | ICD-10-CM | POA: Diagnosis present

## 2021-03-16 DIAGNOSIS — R072 Precordial pain: Secondary | ICD-10-CM | POA: Insufficient documentation

## 2021-03-16 LAB — BASIC METABOLIC PANEL
Anion gap: 6 (ref 5–15)
BUN: 17 mg/dL (ref 6–20)
CO2: 26 mmol/L (ref 22–32)
Calcium: 8.9 mg/dL (ref 8.9–10.3)
Chloride: 106 mmol/L (ref 98–111)
Creatinine, Ser: 1.65 mg/dL — ABNORMAL HIGH (ref 0.44–1.00)
GFR, Estimated: 39 mL/min — ABNORMAL LOW (ref >60)
Glucose, Bld: 108 mg/dL — ABNORMAL HIGH (ref 70–99)
Potassium: 4.1 mmol/L (ref 3.5–5.1)
Sodium: 138 mmol/L (ref 135–145)

## 2021-03-16 LAB — HEPATIC FUNCTION PANEL
ALT: 18 U/L (ref 0–44)
AST: 24 U/L (ref 15–41)
Albumin: 4 g/dL (ref 3.5–5.0)
Alkaline Phosphatase: 52 U/L (ref 38–126)
Bilirubin, Direct: <0.1 mg/dL (ref 0.0–0.2)
Total Bilirubin: 1 mg/dL (ref 0.3–1.2)
Total Protein: 7.4 g/dL (ref 6.5–8.1)

## 2021-03-16 LAB — D-DIMER, QUANTITATIVE: D-Dimer, Quant: 0.36 ug/mL-FEU (ref 0.00–0.50)

## 2021-03-16 LAB — CBC
HCT: 38.6 % (ref 36.0–46.0)
Hemoglobin: 12.9 g/dL (ref 12.0–15.0)
MCH: 30.6 pg (ref 26.0–34.0)
MCHC: 33.4 g/dL (ref 30.0–36.0)
MCV: 91.5 fL (ref 80.0–100.0)
Platelets: 252 10*3/uL (ref 150–400)
RBC: 4.22 MIL/uL (ref 3.87–5.11)
RDW: 12.9 % (ref 11.5–15.5)
WBC: 7.7 10*3/uL (ref 4.0–10.5)
nRBC: 0 % (ref 0.0–0.2)

## 2021-03-16 LAB — I-STAT BETA HCG BLOOD, ED (MC, WL, AP ONLY): I-stat hCG, quantitative: <5 m[IU]/mL (ref <5)

## 2021-03-16 LAB — LIPASE, BLOOD: Lipase: 42 U/L (ref 11–51)

## 2021-03-16 LAB — TROPONIN I (HIGH SENSITIVITY)
Troponin I (High Sensitivity): 2 ng/L (ref <18)
Troponin I (High Sensitivity): 3 ng/L (ref <18)

## 2021-03-16 MED ORDER — ACETAMINOPHEN 325 MG PO TABS
650.0000 mg | ORAL_TABLET | Freq: Once | ORAL | Status: AC
  Administered 2021-03-16: 650 mg via ORAL
  Filled 2021-03-16: qty 2

## 2021-03-16 MED ORDER — SODIUM CHLORIDE 0.9 % IV BOLUS (SEPSIS)
1000.0000 mL | Freq: Once | INTRAVENOUS | Status: AC
  Administered 2021-03-16: 1000 mL via INTRAVENOUS

## 2021-03-16 NOTE — ED Provider Notes (Signed)
[facility] DEPT Provider Note   CSN: [id number] Arrival date & time: 03/16/21  6389     History Chief Complaint  Patient presents with  . Chest Pain    Brooke Turner is a 45 y.o. female.   Chest Pain Pain location:  L chest Pain quality: pressure and sharp   Pain radiates to:  Does not radiate Pain severity:  Moderate Onset quality:  Gradual Timing:  Intermittent Progression:  Unchanged Chronicity:  New Relieved by:  Nothing Worsened by:  Deep breathing Associated symptoms: abdominal pain, back pain, numbness and shortness of breath   Associated symptoms: no cough, no fever, no vomiting and no weakness   Risk factors: hypertension   Risk factors: no coronary artery disease, no diabetes mellitus and no prior DVT/PE    Patient with history of hypertension, GERD, anxiety, frequent episodes of chest pain, presents with chest and back pain.  Patient reports over the past day she noted bilateral finger numbness and upper back pain.  She has also had episodes of left-sided chest pain.  At times the pain is worse with breathing.  No fevers or vomiting.  She has had shortness of breath.  No hemoptysis.  Patient is a non-smoker. No focal weakness is reported She also mentions some upper abdominal pain that is improving    Past Medical History:  Diagnosis Date  . Anxiety   . Back pain   . Chest pain    a. 09/2014 Echo: EF 60-65%, no rwma, nl valves;  b. 12/2014 Neg ETT.  . Essential hypertension    a. 01/2016 Renal duplex: no RAS- ? renal cyst (renal u/s 4/17 - no cyst, nl study); b. 02/2016 24 hr BP Monitor: Mean SBP ~ 138 with Max of 198. Highest pressures noted in evening hours following placement of cuff.  . Fibroid uterus   . GERD (gastroesophageal reflux disease)   . Palpitations    a. 09/2014 nl event monitor.    Patient Active Problem List   Diagnosis Date Noted  . Gastroesophageal reflux disease without esophagitis  06/03/2020  . Type 2 diabetes mellitus without complication, without long-term current use of insulin ([place]) 06/03/2020  . Chest discomfort 01/03/2020  . Pounding heartbeat 10/25/2019  . Costochondral junction syndrome 10/02/2019  . OSA (obstructive sleep apnea) 06/11/2019  . Encounter for examination following treatment at hospital 04/25/2019  . Hypokalemia 04/25/2019  . Elevated glucose 04/25/2019  . History of posttraumatic stress disorder (PTSD) 12/31/2018  . History of palpitations 12/31/2018  . Contusion of left knee 07/06/2018  . Chronic tension-type headache, not intractable 06/10/2015  . Dyspnea 03/18/2015  . Post-traumatic stress reaction 02/25/2015  . Anxiety disorder due to general medical condition with panic attack 02/25/2015  . Fibroid uterus 02/25/2015  . Anxiety about health 02/25/2015  . Deviated nasal septum 12/30/2014  . Chronic rhinitis 12/30/2014  . Chronic maxillary sinusitis 12/30/2014  . Snorings 09/30/2014  . Other chest pain 09/11/2014  . Prediabetes 08/26/2014  . Acanthosis nigricans 03/19/2014  . Vitamin D deficiency 07/15/2013  . Large breasts 09/12/2012  . Essential hypertension 02/17/2012  . Obesity (BMI 30-39.9) previous BMI was over 40. Patient has lost 70 pounds in the last year 02/17/2012  . GAD (generalized anxiety disorder) 02/17/2012  . Chronic back pain 02/17/2012    Past Surgical History:  Procedure Laterality Date  . ESOPHAGOGASTRODUODENOSCOPY  01/2020  . WISDOM TOOTH EXTRACTION       OB History    Gravida  2  Para  0   Term  0   Preterm  0   AB  2   Living  0     SAB  1   IAB  1   Ectopic  0   Multiple  0   Live Births              Family History  Problem Relation Age of Onset  . Seizures Mother   . Epilepsy Mother   . Cancer Father        Liver  . Diabetes Father 64  . Hypertension Father   . Heart disease Father 65       CEA, LE [place]  . Alzheimer's disease Maternal Grandmother   . Heart  disease Maternal Grandfather   . Colon cancer Neg Hx   . Breast cancer Neg Hx     Social History   Tobacco Use  . Smoking status: Former Smoker    Packs/day: 0.25    Years: 10.00    Pack years: 2.50    Types: Cigarettes    Start date: 06/25/2000    Quit date: 08/19/2014    Years since quitting: 6.5  . Smokeless tobacco: Never Used  Vaping Use  . Vaping Use: Never used  Substance Use Topics  . Alcohol use: No    Alcohol/week: 0.0 standard drinks  . Drug use: No    Home Medications Prior to Admission medications   Medication Sig Start Date End Date Taking? Authorizing Provider  acetaminophen (TYLENOL) 500 MG tablet Take 500 mg by mouth every 6 (six) hours as needed (back pain/ cramps).     [provider]  atenolol (TENORMIN) 25 MG tablet Take 1 tablet by mouth twice daily Patient taking differently: 1/2 tab in the morning and 1/4 at night 01/18/21   Minus Breeding, MD  blood glucose meter kit and supplies Test blood sugar twice daily. Dx code: R73.09 04/25/19   Maryruth Hancock, MD  cholecalciferol (VITAMIN D3) 25 MCG (1000 UNIT) tablet Take 1,000 Units by mouth daily.    [provider]  ELDERBERRY PO Take 1 capsule by mouth daily.    [provider]  famotidine (PEPCID) 20 MG tablet Take 1 tablet (20 mg total) by mouth 2 (two) times daily. 11/20/18   [name], MD  ferrous sulfate 325 (65 FE) MG tablet Take 1 tablet (325 mg total) by mouth every other day. Please take with a source of Vitamin C 06/04/20   Orson Slick, MD  fluticasone [facility]) 50 MCG/ACT nasal spray Place 2 sprays into both nostrils daily. Patient taking differently: Place 2 sprays into both nostrils as needed (seasonal allergies). 10/10/17   Tenna Delaine D, PA-C  glucose blood (ONETOUCH VERIO) test strip TEST BLOOD SUGAR DAILY 09/02/19   Forrest Moron, MD  levalbuterol [facility] HFA) 45 MCG/ACT inhaler Inhale 1-2 puffs every 6 hours if needed- rescue inhaler 09/18/20   Baird Lyons D, MD  levocetirizine (XYZAL) 5 MG tablet levocetirizine 5 mg tablet  TAKE 1 TABLET BY MOUTH AT BEDTIME FOR 2 WEEKS THEN AS NEEDED    [provider]  LORazepam (ATIVAN) 0.5 MG tablet Take 1 tablet (0.5 mg total) by mouth every 6 (six) hours as needed for anxiety. Patient taking differently: Take 0.25 mg by mouth 3 (three) times daily as needed for anxiety. 03/19/19   Forrest Moron, MD  meclizine (ANTIVERT) 25 MG tablet as needed. prn    [provider]  Multiple Vitamins-Minerals (MULTIVITAMIN GUMMIES ADULT PO) Take 2 tablets by mouth daily.     [provider]  nitroGLYCERIN (NITROSTAT) 0.4 MG SL tablet Place 1 tablet (0.4 mg total) under the tongue every 5 (five) minutes as needed for chest pain. 03/04/21 06/02/21  Minus Breeding, MD  Norethindrone-Ethinyl Estradiol-Fe Biphas (LO LOESTRIN FE) 1 MG-10 MCG / 10 MCG tablet Take 1 tablet by mouth at bedtime.     [provider]  Jonetta Speak LANCETS 16X MISC USE TO TEST BLOOD SUGAR DAILY 11/16/18   [name], MD  pantoprazole (PROTONIX) 40 MG tablet Take 1 tablet (40 mg total) by mouth 2 (two) times daily. 02/23/21   Thornton Park, MD  Peppermint Oil (IBGARD) 90 MG CPCR Use as directed 06/04/20   Thornton Park, MD  sucralfate (CARAFATE) 1 g tablet Take 1 tablet (1 g total) by mouth 4 (four) times daily as needed. Make a slurry 08/10/20   Thornton Park, MD  vitamin B-12 (CYANOCOBALAMIN) 1000 MCG tablet Take 1,000 mcg by mouth daily.     [provider]    Allergies    Penicillins, Tamiflu [oseltamivir phosphate], Tinidazole, and Diltiazem  Review of Systems   Review of Systems  Constitutional: Negative for fever.  Respiratory: Positive for shortness of breath. Negative for cough.   Cardiovascular: Positive for chest pain.  Gastrointestinal: Positive for abdominal pain. Negative for vomiting.  Musculoskeletal: Positive for back pain.  Neurological: Positive for numbness.  Negative for weakness.  All other systems reviewed and are negative.   Physical Exam Updated Vital Signs BP (!) 159/132   Pulse 74   Temp 98.3 F (36.8 C) (Oral)   Resp 13   Ht 1.626 m (5' 4" )   Wt 77.1 kg   SpO2 100%   BMI 29.18 kg/m   Physical Exam CONSTITUTIONAL: Well developed/well nourished, no acute distress HEAD: Normocephalic/atraumatic EYES: EOMI/PERRL ENMT: Mucous membranes moist NECK: supple no meningeal signs SPINE/BACK:entire spine nontender CV: S1/S2 noted, no murmurs/rubs/gallops noted LUNGS: Lungs are clear to auscultation bilaterally, no apparent distress ABDOMEN: soft, nontender, no rebound or guarding, bowel sounds noted throughout abdomen GU:no cva tenderness NEURO: Pt is awake/alert/appropriate, moves all extremitiesx4.  No facial droop.  No arm or leg drift.  Equal handgrips noted.  No obvious sensory deficit EXTREMITIES: pulses normal/equalx4, full ROM, no calf tenderness SKIN: warm, color normal PSYCH: no abnormalities of mood noted, alert and oriented to situation  ED Results / Procedures / Treatments   Labs (all labs ordered are listed, but only abnormal results are displayed) Labs Reviewed  BASIC METABOLIC PANEL - Abnormal; Notable for the following components:      Result Value   Glucose, Bld 108 (*)    Creatinine, Ser 1.65 (*)    GFR, Estimated 39 (*)    All other components within normal limits  CBC  HEPATIC FUNCTION PANEL  LIPASE, BLOOD  D-DIMER, QUANTITATIVE  I-STAT BETA HCG BLOOD, ED ([facility], [facility], [facility] ONLY)  TROPONIN I (HIGH SENSITIVITY)  TROPONIN I (HIGH SENSITIVITY)    EKG EKG Interpretation  Date/Time:  Tuesday Mar 16 2021 02:28:12 EDT Ventricular Rate:  76 PR Interval:  167 QRS Duration: 98 QT Interval:  373 QTC Calculation: 420 R Axis:   43 Text Interpretation: Sinus rhythm Probable left atrial enlargement Confirmed by Ripley Fraise [phone]) on 03/16/2021 2:30:24 AM   Radiology DG Chest 2 View  Result Date:  03/16/2021 CLINICAL DATA:  45 year old female with chest pain EXAM: CHEST - 2 VIEW COMPARISON:  Chest radiograph dated 01/29/2021. FINDINGS: The heart size and mediastinal contours are within normal limits. Both lungs are clear. The visualized skeletal structures are unremarkable. IMPRESSION: No active cardiopulmonary disease. Electronically Signed   By: Anner Crete M.D.   On: 03/16/2021 03:20    Procedures Procedures   Medications Ordered in ED Medications  sodium chloride 0.9 % bolus 1,000 mL (0 mLs Intravenous Stopped 03/16/21 0515)  acetaminophen (TYLENOL) tablet 650 mg (650 mg Oral Given 03/16/21 0422)    ED Course  I have reviewed the triage vital signs and the nursing notes.  Pertinent labs & imaging results that were available during my care of the patient were reviewed by me and considered in my medical decision making (see chart for details).    MDM Rules/Calculators/A&P                          3:10 AM Patient presents with recurrent chest pain.  She is now reporting upper back pain and bilateral finger numbness that is improving. Patient is had extensive evaluation previously for chest pain.  She is followed by cardiology and had an appointment last month & it was felt that she has noncardiac chest pain Imaging and labs are pending at this time. Patient declines pain medicines at this time  5:21 AM Patient monitored for several hours in the emergency department Vital signs have improved BP 125/69 (BP Location: Right Arm)   Pulse 65   Temp 98.3 F (36.8 C) (Oral)   Resp 17   Ht 1.626 m (5' 4" )   Wt 77.1 kg   SpO2 99%   BMI 29.18 kg/m  Patient ambulated without any hypoxia. Patient reports pain is improved. D-dimer negative, low suspicion for PE. Patient overall appears low risk for ACS, troponins were unremarkable without any acute EKG changes Patient had no neuro deficits here, denies any severe pain, low suspicion for acute aortic dissection at this time.   Patient had extensive work-up previously including multiple CT chests in the past. At this time I do not feel CT imaging is warranted.  Patient did have mild dehydration and she admits to decreased oral intake.  I reviewed her medication list and did not reveal any obvious nephrotoxic drugs I advised her to increase p.o. fluids and to follow-up as an outpatient for recheck of her creatinine Final Clinical Impression(s) / ED Diagnoses Final diagnoses:  Precordial pain  Dehydration    Rx / DC Orders ED Discharge Orders    None       Ripley Fraise, MD 03/16/21 [phone]

## 2021-03-16 NOTE — Discharge Instructions (Addendum)

## 2021-03-16 NOTE — ED Notes (Signed)
Pt maintained O2 saturation above 94% while ambulating. Pt had steady gait.

## 2021-03-16 NOTE — ED Notes (Signed)
Pt ambulatory without any assistance.

## 2021-03-16 NOTE — ED Triage Notes (Signed)
Pt reports intermittent sternal chest pains while at rest for several days. Pt describes a sharp pain when taking a deep breath that is associated with fingertip numbness.

## 2021-03-16 NOTE — ED Notes (Signed)
Pt verbalized understanding of d/c and follow up care. Ambulatory with steady gait.  

## 2021-04-06 ENCOUNTER — Telehealth: Payer: Self-pay | Admitting: Gastroenterology

## 2021-04-06 ENCOUNTER — Telehealth: Payer: Self-pay

## 2021-04-06 NOTE — Telephone Encounter (Signed)
Left message for patient to please call back. 

## 2021-04-07 ENCOUNTER — Other Ambulatory Visit: Payer: Self-pay

## 2021-04-07 DIAGNOSIS — R195 Other fecal abnormalities: Secondary | ICD-10-CM

## 2021-04-07 NOTE — Telephone Encounter (Signed)
Put order in Epic for Hemoccult x 3 and called patient to come into our lab for them

## 2021-04-07 NOTE — Telephone Encounter (Signed)
Called patient and she states for about 1 week she has had pink/light red stool. NO blood on the tissue or in the toilet. Minimal cramping before a BM, not real pian. Thought  last week it was the watermelon she was eating, but she hasn't had any for several days and her stool is still the same color. She is concerned and wants a stool test. Please advise.

## 2021-04-08 IMAGING — CR CHEST - 2 VIEW
2 series · 2 of 2 positions shown · non-contrast
Comparison: 02/07/2019

CLINICAL DATA: Chest pain, shortness of breath

EXAM:
CHEST - 2 VIEW

[chest pa]
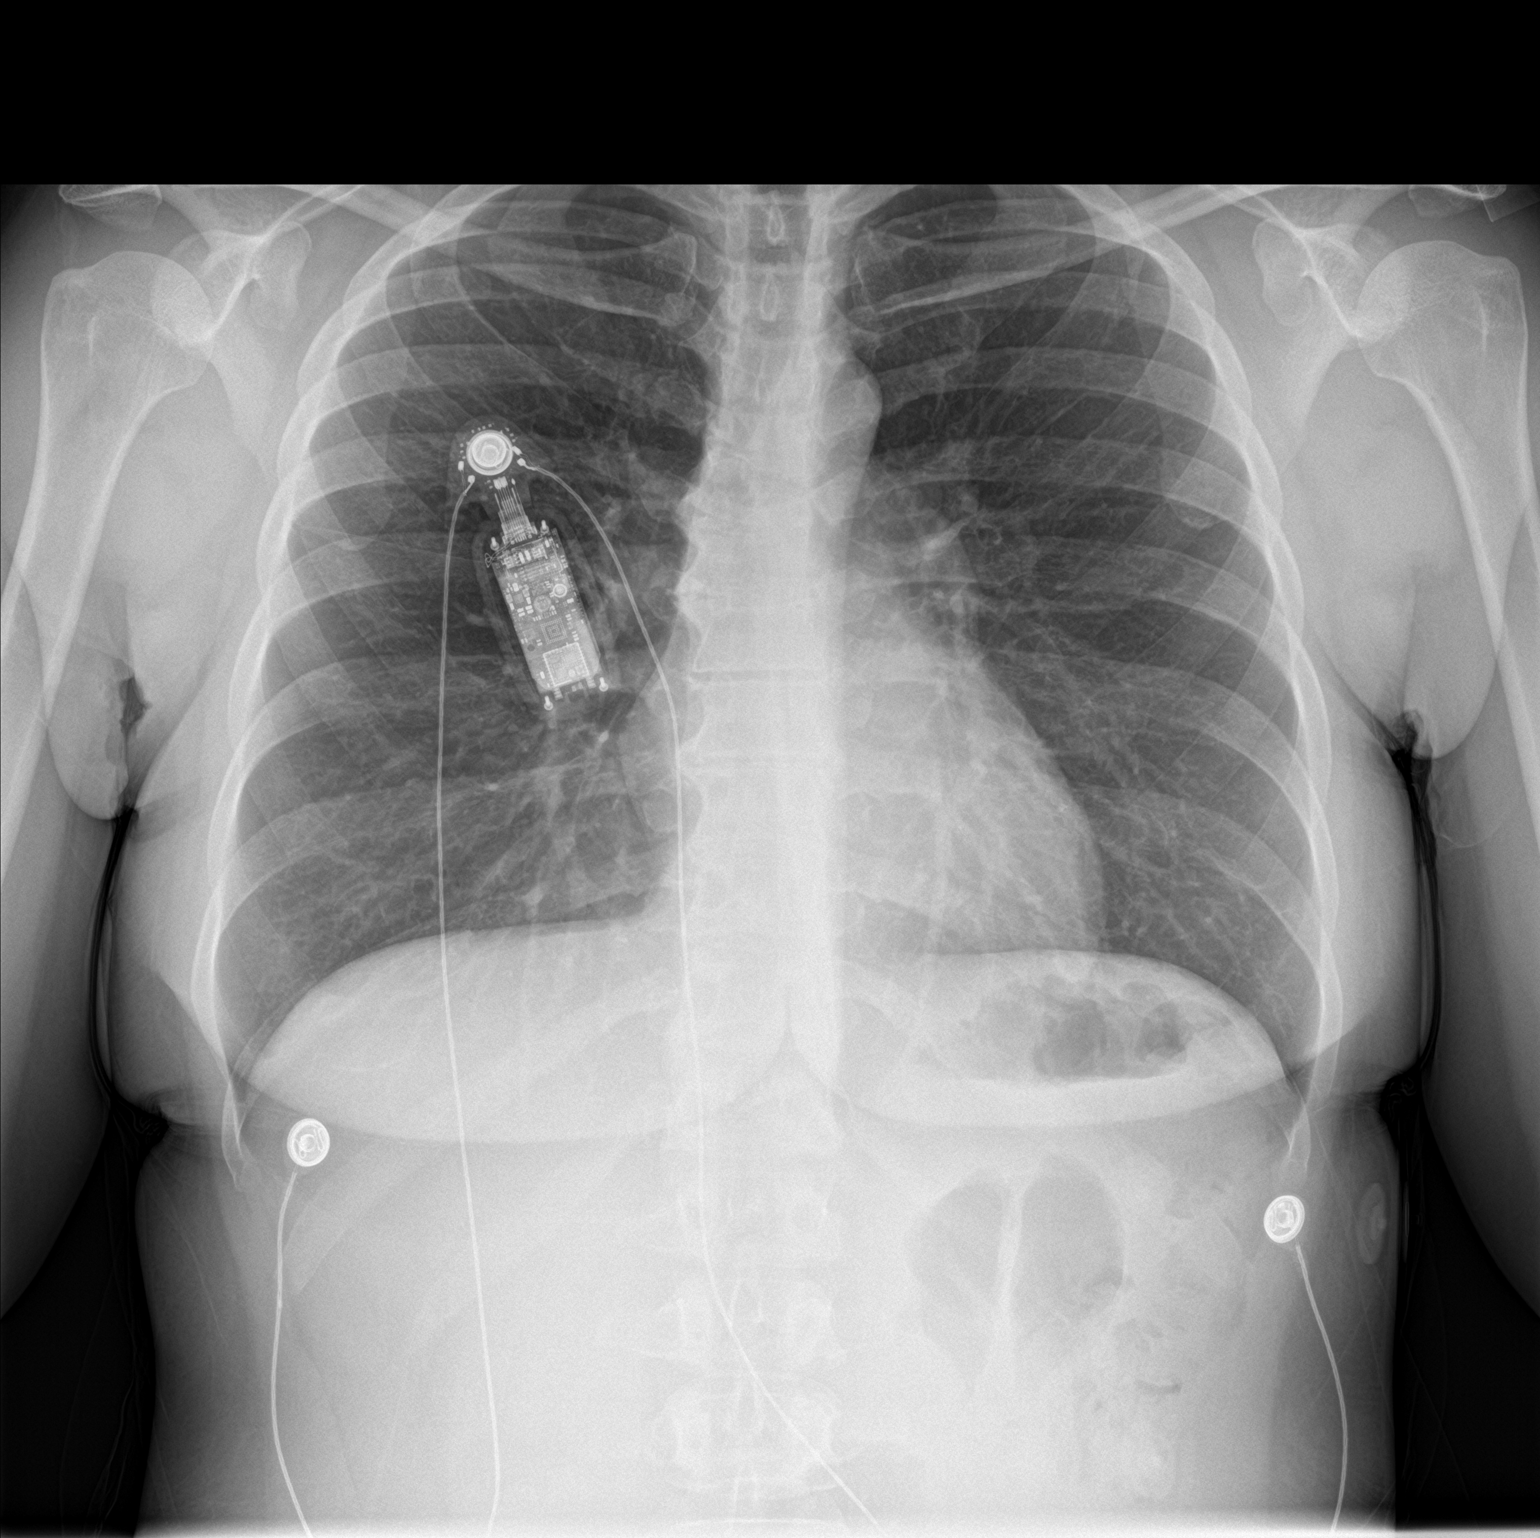

[chest lat]
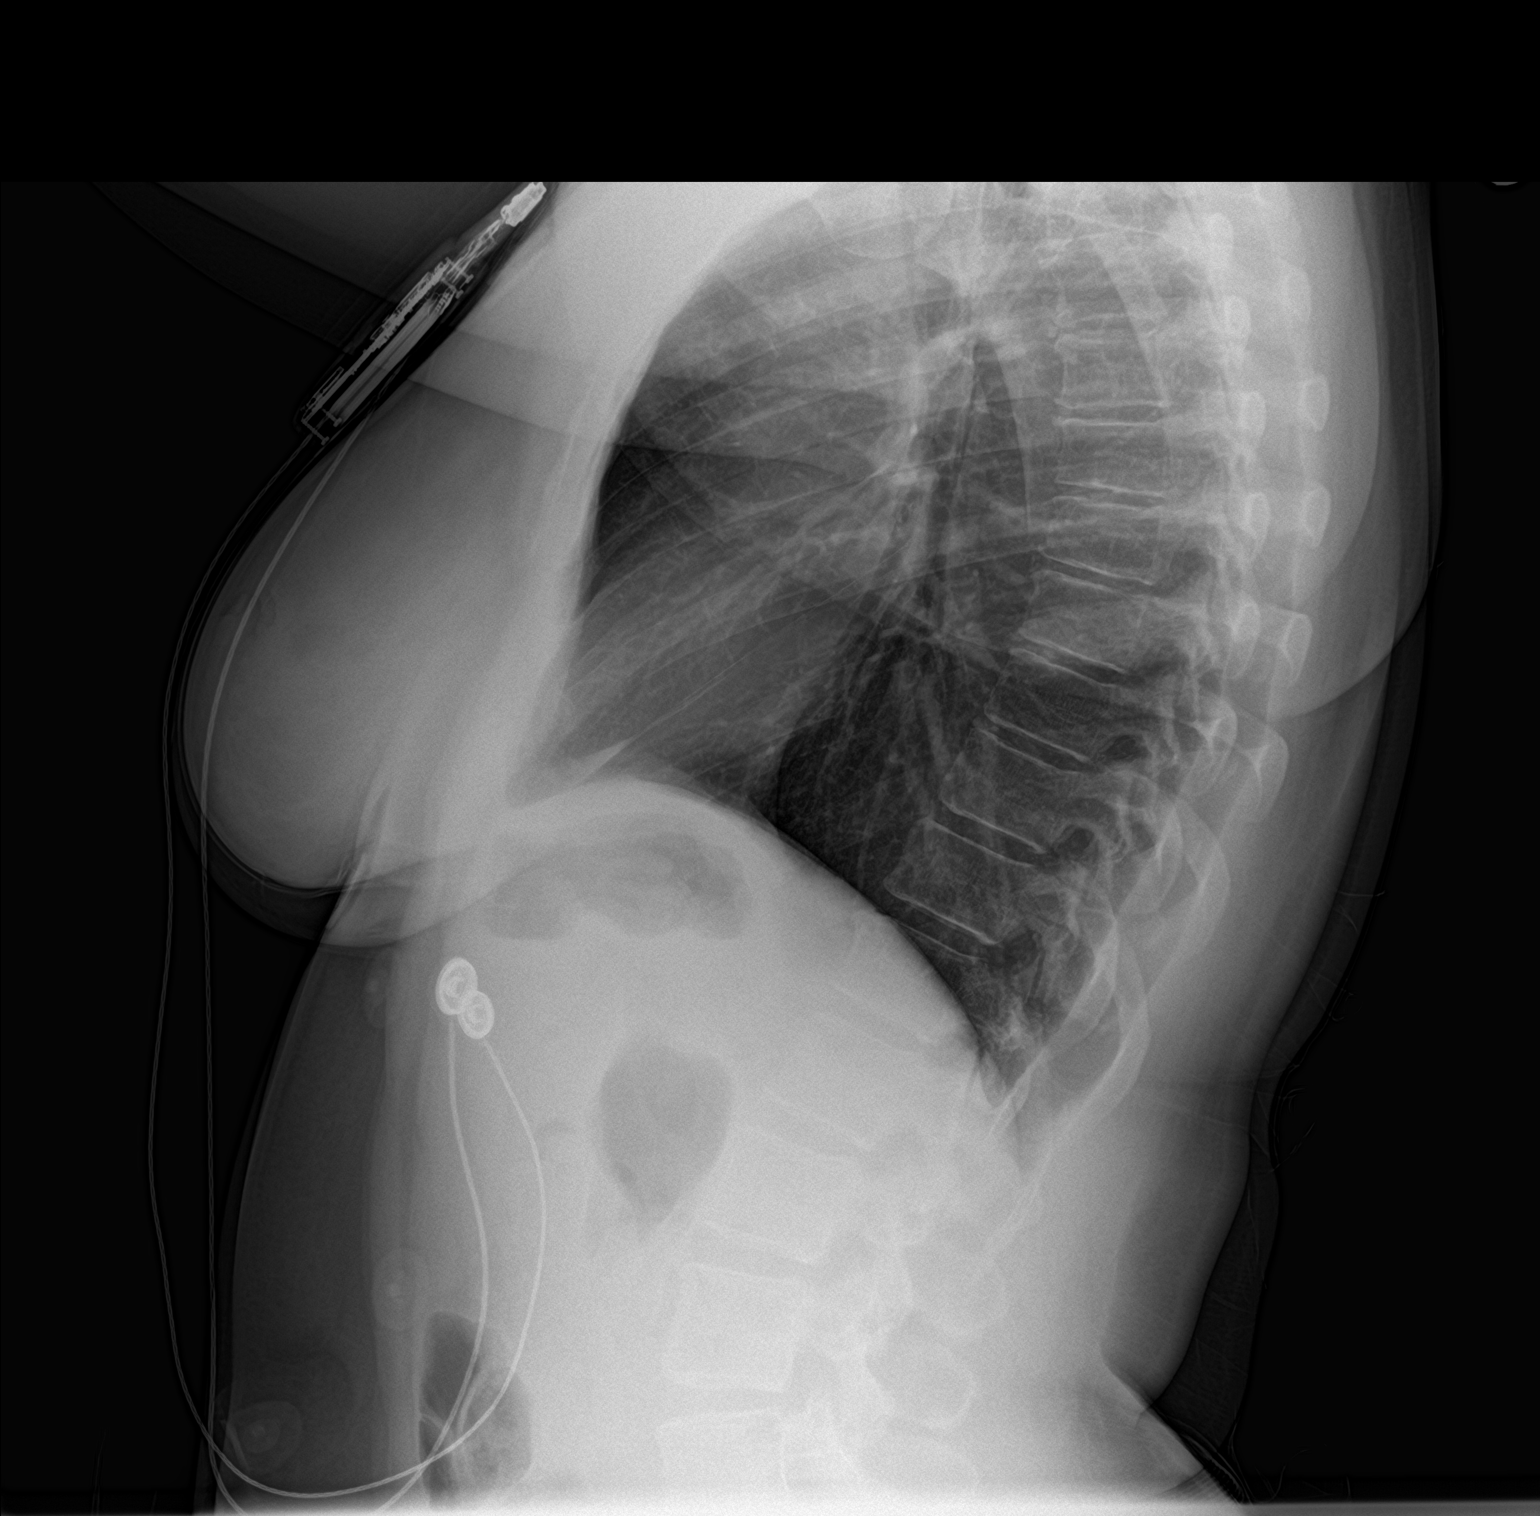

[2 of 2 positions shown; findings below may reference images not displayed]

FINDINGS: Heart and mediastinal contours are within normal limits. No focal
opacities or effusions. No acute bony abnormality.
IMPRESSION: No active cardiopulmonary disease.

## 2021-04-09 ENCOUNTER — Other Ambulatory Visit: Payer: Managed Care, Other (non HMO)

## 2021-04-09 ENCOUNTER — Other Ambulatory Visit: Payer: Self-pay | Admitting: Gastroenterology

## 2021-04-09 DIAGNOSIS — K209 Esophagitis, unspecified without bleeding: Secondary | ICD-10-CM

## 2021-04-09 DIAGNOSIS — K219 Gastro-esophageal reflux disease without esophagitis: Secondary | ICD-10-CM

## 2021-04-12 NOTE — Telephone Encounter (Signed)
Patient called has questions on the Hemoccult sample kit. Said there was a discrepancies with the information provided. She was told she can mail in the kit and the lab told her she should drop it off. I advised her that she has the option of either one. She said ok but would still like to have a nurse call her.

## 2021-04-12 NOTE — Telephone Encounter (Signed)
Called patient and answered questions about Hemoccult

## 2021-04-15 ENCOUNTER — Telehealth: Payer: Self-pay | Admitting: Hematology and Oncology

## 2021-04-15 NOTE — Telephone Encounter (Signed)
R/s per 6/8 sch msg, pt aware

## 2021-04-15 NOTE — Telephone Encounter (Signed)
R/s per 6/9 sch msg, pt aware

## 2021-04-16 ENCOUNTER — Ambulatory Visit: Payer: Managed Care, Other (non HMO) | Admitting: Hematology and Oncology

## 2021-04-16 ENCOUNTER — Other Ambulatory Visit: Payer: Managed Care, Other (non HMO)

## 2021-04-16 ENCOUNTER — Telehealth: Payer: Self-pay | Admitting: Gastroenterology

## 2021-04-16 NOTE — Telephone Encounter (Signed)
Discussed with pt that no order for cologuard was found in her chart. She states she got a call from a number and she was left a message about a cologuard. Pt will call the number back and see what she can find out.

## 2021-04-16 NOTE — Telephone Encounter (Signed)
Pt states that she received a call from [employer] inquiring about for test. Pt wants to know if Dr. Tarri Glenn ordered it. Pls call her.

## 2021-04-20 ENCOUNTER — Telehealth: Payer: Self-pay | Admitting: Physician Assistant

## 2021-04-20 ENCOUNTER — Other Ambulatory Visit: Payer: Managed Care, Other (non HMO)

## 2021-04-20 DIAGNOSIS — R195 Other fecal abnormalities: Secondary | ICD-10-CM

## 2021-04-20 LAB — HEMOCCULT SLIDES (X 3 CARDS)
Fecal Occult Blood: NEGATIVE
OCCULT 1: NEGATIVE
OCCULT 2: NEGATIVE
OCCULT 3: NEGATIVE
OCCULT 4: NEGATIVE
OCCULT 5: NEGATIVE

## 2021-04-20 NOTE — Telephone Encounter (Signed)
Pt returning call to discuss lab results.. Plz advise   thanks

## 2021-04-20 NOTE — Telephone Encounter (Signed)
Spoke with patient, see 6/14 hemoccult result note for more information.

## 2021-04-30 ENCOUNTER — Telehealth: Payer: Self-pay | Admitting: *Deleted

## 2021-04-30 NOTE — Telephone Encounter (Signed)
Received vm message from patient requesting a call back. TCT patient - no answer but was able to leave vm message for pt to call back at her convenience

## 2021-05-05 ENCOUNTER — Inpatient Hospital Stay (HOSPITAL_BASED_OUTPATIENT_CLINIC_OR_DEPARTMENT_OTHER): Payer: Managed Care, Other (non HMO) | Admitting: Hematology and Oncology

## 2021-05-05 ENCOUNTER — Inpatient Hospital Stay: Payer: Managed Care, Other (non HMO) | Attending: Hematology and Oncology

## 2021-05-05 ENCOUNTER — Other Ambulatory Visit: Payer: Self-pay

## 2021-05-05 ENCOUNTER — Other Ambulatory Visit: Payer: Self-pay | Admitting: Hematology and Oncology

## 2021-05-05 ENCOUNTER — Encounter: Payer: Self-pay | Admitting: Hematology and Oncology

## 2021-05-05 VITALS — BP 136/77 | HR 63 | Temp 97.6°F | Resp 18 | Ht 64.0 in | Wt 206.2 lb

## 2021-05-05 DIAGNOSIS — D500 Iron deficiency anemia secondary to blood loss (chronic): Secondary | ICD-10-CM | POA: Diagnosis not present

## 2021-05-05 DIAGNOSIS — Z8249 Family history of ischemic heart disease and other diseases of the circulatory system: Secondary | ICD-10-CM | POA: Insufficient documentation

## 2021-05-05 DIAGNOSIS — Z87891 Personal history of nicotine dependence: Secondary | ICD-10-CM | POA: Insufficient documentation

## 2021-05-05 DIAGNOSIS — Z809 Family history of malignant neoplasm, unspecified: Secondary | ICD-10-CM | POA: Diagnosis not present

## 2021-05-05 DIAGNOSIS — Z880 Allergy status to penicillin: Secondary | ICD-10-CM | POA: Diagnosis not present

## 2021-05-05 DIAGNOSIS — Z820 Family history of epilepsy and other diseases of the nervous system: Secondary | ICD-10-CM | POA: Diagnosis not present

## 2021-05-05 DIAGNOSIS — D509 Iron deficiency anemia, unspecified: Secondary | ICD-10-CM | POA: Diagnosis present

## 2021-05-05 DIAGNOSIS — R110 Nausea: Secondary | ICD-10-CM | POA: Diagnosis not present

## 2021-05-05 DIAGNOSIS — R0602 Shortness of breath: Secondary | ICD-10-CM | POA: Insufficient documentation

## 2021-05-05 DIAGNOSIS — Z833 Family history of diabetes mellitus: Secondary | ICD-10-CM | POA: Diagnosis not present

## 2021-05-05 DIAGNOSIS — R5383 Other fatigue: Secondary | ICD-10-CM | POA: Diagnosis not present

## 2021-05-05 DIAGNOSIS — D560 Alpha thalassemia: Secondary | ICD-10-CM | POA: Diagnosis present

## 2021-05-05 DIAGNOSIS — Z79899 Other long term (current) drug therapy: Secondary | ICD-10-CM | POA: Diagnosis not present

## 2021-05-05 DIAGNOSIS — Z832 Family history of diseases of the blood and blood-forming organs and certain disorders involving the immune mechanism: Secondary | ICD-10-CM

## 2021-05-05 LAB — CMP (CANCER CENTER ONLY)
ALT: 12 U/L (ref 0–44)
AST: 19 U/L (ref 15–41)
Albumin: 3.6 g/dL (ref 3.5–5.0)
Alkaline Phosphatase: 69 U/L (ref 38–126)
Anion gap: 10 (ref 5–15)
BUN: 15 mg/dL (ref 6–20)
CO2: 24 mmol/L (ref 22–32)
Calcium: 8.6 mg/dL — ABNORMAL LOW (ref 8.9–10.3)
Chloride: 104 mmol/L (ref 98–111)
Creatinine: 1.16 mg/dL — ABNORMAL HIGH (ref 0.44–1.00)
GFR, Estimated: 59 mL/min — ABNORMAL LOW (ref >60)
Glucose, Bld: 93 mg/dL (ref 70–99)
Potassium: 4.2 mmol/L (ref 3.5–5.1)
Sodium: 138 mmol/L (ref 135–145)
Total Bilirubin: 0.8 mg/dL (ref 0.3–1.2)
Total Protein: 7.2 g/dL (ref 6.5–8.1)

## 2021-05-05 LAB — CBC WITH DIFFERENTIAL (CANCER CENTER ONLY)
Abs Immature Granulocytes: 0.01 10*3/uL (ref 0.00–0.07)
Basophils Absolute: 0 10*3/uL (ref 0.0–0.1)
Basophils Relative: 1 %
Eosinophils Absolute: 0 10*3/uL (ref 0.0–0.5)
Eosinophils Relative: 1 %
HCT: 37.6 % (ref 36.0–46.0)
Hemoglobin: 12.9 g/dL (ref 12.0–15.0)
Immature Granulocytes: 0 %
Lymphocytes Relative: 52 %
Lymphs Abs: 3.2 10*3/uL (ref 0.7–4.0)
MCH: 30.6 pg (ref 26.0–34.0)
MCHC: 34.3 g/dL (ref 30.0–36.0)
MCV: 89.1 fL (ref 80.0–100.0)
Monocytes Absolute: 0.7 10*3/uL (ref 0.1–1.0)
Monocytes Relative: 11 %
Neutro Abs: 2.1 10*3/uL (ref 1.7–7.7)
Neutrophils Relative %: 35 %
Platelet Count: 253 10*3/uL (ref 150–400)
RBC: 4.22 MIL/uL (ref 3.87–5.11)
RDW: 12.3 % (ref 11.5–15.5)
WBC Count: 6.1 10*3/uL (ref 4.0–10.5)
nRBC: 0 % (ref 0.0–0.2)

## 2021-05-05 LAB — RETIC PANEL
Immature Retic Fract: 6 % (ref 2.3–15.9)
RBC.: 4.18 MIL/uL (ref 3.87–5.11)
Retic Count, Absolute: 53.5 10*3/uL (ref 19.0–186.0)
Retic Ct Pct: 1.3 % (ref 0.4–3.1)
Reticulocyte Hemoglobin: 35.2 pg (ref >27.9)

## 2021-05-05 NOTE — Progress Notes (Signed)
[place] [phone]   [phone]  PROGRESS NOTE  Patient Care Team: [name], MD as PCP - General (Family Medicine) Minus Breeding, MD as PCP - Cardiology (Cardiology) Deboraha Sprang, MD as PCP - Electrophysiology (Cardiology) Melvenia Beam, MD as Consulting Physician (Otolaryngology) Lendon Colonel, NP as Nurse Practitioner (Cardiology) Cameron Sprang, MD as Consulting Physician (Neurology)  Hematological/Oncological History # Iron Deficiency without Anemia  1) 04/30/2020: WBC 5.7, hgb 13.2, MCV 90.6, Plt 283. Iron 52, ferritin 11.7, transferrin 395.  2) 06/04/2020: establish care with Dr. Lorenso Courier 3) 01/13/2021: Iron 38, TIBC 491, Sat 8%, Ferritin 11    #Family History of Thalassemia 1) Patient's grandfather had "thalassemia major" with her mom and brother having "thalassemia minor"  2) Hgb electropharesis negative, though may still carry alpha thalassemia.   Interval History:  Brooke Turner 45 y.o. female with medical history significant for iron deficiency anemia presents for a follow up visit. The patient's last visit was on 01/13/2021. In the interim since the last visit she has continued iron therapy, but dropped to every other day.   On exam today Brooke Turner notes continues to struggle with fatigue.  She does report that it is improved from when she first began seeing [place] but is not back to where she would like it to be.  She has been taking iron pills as requested every other day.  She did take off for 1 week due to a "colon test" that she was having done.  She does have some periodic nausea which has been manageable.  She denies having any constipation and reports that "I could use a slow down".  She otherwise has had some decrease in her shortness of breath but no other improvements at this time.  She has not been eating much in the way of red meat because she is concerned about her history of having high lipoprotein  a.  She currently denies any other overt signs of bleeding.  She reports no fevers, chills, sweats, vomiting or diarrhea.  A full 10 point ROS is listed below.  MEDICAL HISTORY:  Past Medical History:  Diagnosis Date   Anxiety    Back pain    Chest pain    a. 09/2014 Echo: EF 60-65%, no rwma, nl valves;  b. 12/2014 Neg ETT.   Essential hypertension    a. 01/2016 Renal duplex: no RAS- ? renal cyst (renal u/s 4/17 - no cyst, nl study); b. 02/2016 24 hr BP Monitor: Mean SBP ~ 138 with Max of 198. Highest pressures noted in evening hours following placement of cuff.   Fibroid uterus    GERD (gastroesophageal reflux disease)    Palpitations    a. 09/2014 nl event monitor.    SURGICAL HISTORY: Past Surgical History:  Procedure Laterality Date   ESOPHAGOGASTRODUODENOSCOPY  01/2020   WISDOM TOOTH EXTRACTION      SOCIAL HISTORY: Social History   Socioeconomic History   Marital status: Single    Spouse name: N/A   Number of children: 0   Years of education: 16   Highest education level: Not on file  Occupational History   Occupation: Customer Service    Employer: [employer]  Tobacco Use   Smoking status: Former    Packs/day: 0.25    Years: 10.00    Pack years: 2.50    Types: Cigarettes    Start date: 06/25/2000    Quit date: 08/19/2014    Years since quitting:  6.7   Smokeless tobacco: Never  Vaping Use   Vaping Use: Never used  Substance and Sexual Activity   Alcohol use: No    Alcohol/week: 0.0 standard drinks   Drug use: No   Sexual activity: Not Currently    Partners: Male    Birth control/protection: Pill  Other Topics Concern   Not on file  Social History Narrative   Patient lives at home alone .   Patient works full time at [employer]   Education college.   Right handed.   Caffeine none   Social Determinants of Radio broadcast assistant Strain: Not on file  Food Insecurity: Not on file  Transportation Needs: Not on file  Physical Activity: Not  on file  Stress: Not on file  Social Connections: Not on file  Intimate Partner Violence: Not on file    FAMILY HISTORY: Family History  Problem Relation Age of Onset   Seizures Mother    Epilepsy Mother    Cancer Father        Liver   Diabetes Father 48   Hypertension Father    Heart disease Father 71       CEA, LE [place]   Alzheimer's disease Maternal Grandmother    Heart disease Maternal Grandfather    Colon cancer Neg Hx    Breast cancer Neg Hx     ALLERGIES:  is allergic to penicillins, tamiflu [oseltamivir phosphate], tinidazole, and diltiazem.  MEDICATIONS:  Current Outpatient Medications  Medication Sig Dispense Refill   acetaminophen (TYLENOL) 500 MG tablet Take 500 mg by mouth every 6 (six) hours as needed (back pain/ cramps).      atenolol (TENORMIN) 25 MG tablet Take 1 tablet by mouth twice daily (Patient taking differently: 1/2 tab in the morning and 1/4 at night) 180 tablet 3   blood glucose meter kit and supplies Test blood sugar twice daily. Dx code: R73.09 1 each 0   cholecalciferol (VITAMIN D3) 25 MCG (1000 UNIT) tablet Take 1,000 Units by mouth daily.     ELDERBERRY PO Take 1 capsule by mouth daily.     famotidine (PEPCID) 20 MG tablet Take 1 tablet (20 mg total) by mouth 2 (two) times daily. 180 tablet 0   ferrous sulfate 325 (65 FE) MG tablet Take 1 tablet (325 mg total) by mouth every other day. Please take with a source of Vitamin C 45 tablet 1   fluticasone (FLONASE) 50 MCG/ACT nasal spray Place 2 sprays into both nostrils daily. (Patient taking differently: Place 2 sprays into both nostrils as needed (seasonal allergies).) 16 g 0   glucose blood (ONETOUCH VERIO) test strip TEST BLOOD SUGAR DAILY 25 each 10   levalbuterol (XOPENEX HFA) 45 MCG/ACT inhaler Inhale 1-2 puffs every 6 hours if needed- rescue inhaler 15 g 0   levocetirizine (XYZAL) 5 MG tablet levocetirizine 5 mg tablet  TAKE 1 TABLET BY MOUTH AT BEDTIME FOR 2 WEEKS THEN AS NEEDED      LORazepam (ATIVAN) 0.5 MG tablet Take 1 tablet (0.5 mg total) by mouth every 6 (six) hours as needed for anxiety. (Patient taking differently: Take 0.25 mg by mouth 3 (three) times daily as needed for anxiety.) 15 tablet 0   Multiple Vitamins-Minerals (MULTIVITAMIN GUMMIES ADULT PO) Take 1 tablet by mouth daily.     nitroGLYCERIN (NITROSTAT) 0.4 MG SL tablet Place 1 tablet (0.4 mg total) under the tongue every 5 (five) minutes as needed for chest pain. 25 tablet 3   Norethindrone-Ethinyl  Estradiol-Fe Biphas (LO LOESTRIN FE) 1 MG-10 MCG / 10 MCG tablet Take 1 tablet by mouth at bedtime.      ONETOUCH DELICA LANCETS 10G MISC USE TO TEST BLOOD SUGAR DAILY 100 each 3   pantoprazole (PROTONIX) 40 MG tablet Take 1 tablet (40 mg total) by mouth 2 (two) times daily. 60 tablet 6   Peppermint Oil (IBGARD) 90 MG CPCR Use as directed (Patient not taking: Reported on 03/16/2021) 16 capsule 0   sucralfate (CARAFATE) 1 g tablet TAKE 1 TABLET BY MOUTH 4 TIMES DAILY AS NEEDED --MAKE  A  SLURRY 30 tablet 0   vitamin B-12 (CYANOCOBALAMIN) 1000 MCG tablet Take 1,000 mcg by mouth daily.      No current facility-administered medications for this visit.    REVIEW OF SYSTEMS:   Constitutional: ( - ) fevers, ( - )  chills , ( - ) night sweats Eyes: ( - ) blurriness of vision, ( - ) double vision, ( - ) watery eyes Ears, nose, mouth, throat, and face: ( - ) mucositis, ( - ) sore throat Respiratory: ( - ) cough, ( - ) dyspnea, ( - ) wheezes Cardiovascular: ( - ) palpitation, ( - ) chest discomfort, ( - ) lower extremity swelling Gastrointestinal:  ( - ) nausea, ( - ) heartburn, ( - ) change in bowel habits Skin: ( - ) abnormal skin rashes Lymphatics: ( - ) new lymphadenopathy, ( - ) easy bruising Neurological: ( - ) numbness, ( - ) tingling, ( - ) new weaknesses Behavioral/Psych: ( - ) mood change, ( - ) new changes  All other systems were reviewed with the patient and are negative.  PHYSICAL EXAMINATION:  Vitals:    05/05/21 1600  BP: 136/77  Pulse: 63  Resp: 18  Temp: 97.6 F (36.4 C)  SpO2: 100%    Filed Weights   05/05/21 1600  Weight: 206 lb 3.2 oz (93.5 kg)     GENERAL: well appearing middle aged [place] American female. alert, no distress and comfortable SKIN: skin color, texture, turgor are normal, no rashes or significant lesions EYES: conjunctiva are pink and non-injected, sclera clear LUNGS: clear to auscultation and percussion with normal breathing effort HEART: regular rate & rhythm and no murmurs and no lower extremity edema Musculoskeletal: no cyanosis of digits and no clubbing  PSYCH: alert & oriented x 3, fluent speech NEURO: no focal motor/sensory deficits  LABORATORY DATA:  I have reviewed the data as listed CBC Latest Ref Rng & Units 05/05/2021 03/16/2021 01/29/2021  WBC 4.0 - 10.5 K/uL 6.1 7.7 7.4  Hemoglobin 12.0 - 15.0 g/dL 12.9 12.9 12.9  Hematocrit 36.0 - 46.0 % 37.6 38.6 39.0  Platelets 150 - 400 K/uL 253 252 252    CMP Latest Ref Rng & Units 03/16/2021 01/29/2021 01/13/2021  Glucose 70 - 99 mg/dL 108(H) 95 103(H)  BUN 6 - 20 mg/dL 17 12 10   Creatinine 0.44 - 1.00 mg/dL 1.65(H) 0.90 1.13(H)  Sodium 135 - 145 mmol/L 138 138 140  Potassium 3.5 - 5.1 mmol/L 4.1 3.7 3.9  Chloride 98 - 111 mmol/L 106 106 106  CO2 22 - 32 mmol/L 26 25 27   Calcium 8.9 - 10.3 mg/dL 8.9 8.4(L) 8.1(L)  Total Protein 6.5 - 8.1 g/dL 7.4 - 7.2  Total Bilirubin 0.3 - 1.2 mg/dL 1.0 - 0.5  Alkaline Phos 38 - 126 U/L 52 - 72  AST 15 - 41 U/L 24 - 16  ALT 0 - 44 U/L  18 - 15    RADIOGRAPHIC STUDIES: No results found.   ASSESSMENT & PLAN Brooke Turner 45 y.o. female with medical history significant for iron deficiency anemia presents for a follow up visit.   After review the labs, the records, discussion with the patient the findings most consistent with persistent iron deficiency without anemia.  Her hemoglobin persistently remains above 12, however her ferritin levels and  iron saturation levels are markedly low.  As such I would recommend that she continue p.o. iron therapy.  The treatment of iron deficiency without anemia is controversial, however given her symptoms I do believe it is reasonable to pursue p.o. iron therapy at this time.  In the event that this was ineffective and her symptoms fail to improve we could consider administration of IV iron, though without anemia it is difficult to justify.  I encouraged the patient to increase iron rich foods in her diet and continue the iron sulfate pill every other day with a source of vitamin C.  She voiced understanding of the plan moving forward.  # Iron Deficiency without Anemia 2/2 to GYN Bleeding --treatment of iron deficiency without anemia is controversial. Recommend that conitnue taking PO iron pills and try increasing the iron in her diet --continue iron sulfate 369m PO QOD with a source of vitamin C --no indication for IV iron at this time, though can be considered if symptoms fail to improve need labs failed to increase on PO iron therapy. --will check full iron panel today.  --RTC in 3 months time or sooner if IV iron is required.    #Family History of Thalassemia --strong family history of thalassemia, though unclear if Alpha or Beta. Likely Beta based on the description --Hgb electrophoresis showed no evidence of beta thal or sickle cell trait. May still carry alpha thalassemia.  --recommend genetic counseling if positive and patient is planning to become pregnant.  No orders of the defined types were placed in this encounter.   All questions were answered. The patient knows to call the clinic with any problems, questions or concerns.  A total of more than 30 minutes were spent on this encounter and over half of that time was spent on counseling and coordination of care as outlined above.   JLedell Peoples MD Department of Hematology/Oncology [facility] CenterPhone: [phone]: [phone]: [email or web address]  05/05/2021 4:33 PM

## 2021-05-06 ENCOUNTER — Ambulatory Visit: Payer: Managed Care, Other (non HMO) | Admitting: Hematology and Oncology

## 2021-05-06 ENCOUNTER — Other Ambulatory Visit: Payer: Managed Care, Other (non HMO)

## 2021-05-06 LAB — IRON AND TIBC
Iron: 69 ug/dL (ref 41–142)
Saturation Ratios: 16 % — ABNORMAL LOW (ref 21–57)
TIBC: 420 ug/dL (ref 236–444)
UIBC: 351 ug/dL (ref 120–384)

## 2021-05-06 LAB — FERRITIN: Ferritin: 19 ng/mL (ref 11–307)

## 2021-05-12 ENCOUNTER — Telehealth: Payer: Self-pay | Admitting: Hematology and Oncology

## 2021-05-12 NOTE — Telephone Encounter (Signed)
Sch per 06/20 los, pt aware

## 2021-05-24 ENCOUNTER — Telehealth: Payer: Self-pay | Admitting: Cardiology

## 2021-05-24 NOTE — Telephone Encounter (Signed)
Called patient back. She states she has noticed some slight leg swelling in her ankles bilaterally. She mentions not knowing how long it has been there, but did notice it for the past week. She has done elevating and did not notice any change, she monitors her diet (low sodium diet), she states they are patches and can be squishy. She states that she has had some weight gain- roughly 3lbs or less in a week, which is not normal for her. She does not have her BP/HR at this time, she has not been monitoring this. No change in her medications.  She mentions having some SOB at times, even with rest- she works from home so she checks her O2 levels when this happens and she is always at 98%. She does work out and notices at times it is an effort to get her breath.  Recent testing was normal.   Patient advised to try compression socks/stockings while we wait for a response from MD. Patient is going out of town this coming weekend and wanted to be seen. Nothing open for her to be seen. Advised I would see what MD would say and we would call her back. Patient verbalized understanding.

## 2021-05-24 NOTE — Telephone Encounter (Signed)
Called patient, advised of message from MD.  Patient made an appointment with Laurann Montana, NP at [facility].  Patient verbalized understanding, thankful for call back and appointment.

## 2021-05-24 NOTE — Telephone Encounter (Signed)
Pt c/o swelling: STAT is pt has developed SOB within 24 hours  If swelling, where is the swelling located? Outer part of ankles towards the back  How much weight have you gained and in what time span? N/a  Have you gained 3 pounds in a day or 5 pounds in a week? Yes within the week   Do you have a log of your daily weights (if so, list)? N/a  Are you currently taking a fluid pill? no  Are you currently SOB? Sometimes   Have you traveled recently? no

## 2021-06-07 ENCOUNTER — Ambulatory Visit (HOSPITAL_BASED_OUTPATIENT_CLINIC_OR_DEPARTMENT_OTHER): Payer: Managed Care, Other (non HMO) | Admitting: Family

## 2021-06-13 ENCOUNTER — Encounter (HOSPITAL_COMMUNITY): Payer: Self-pay

## 2021-06-13 ENCOUNTER — Emergency Department (HOSPITAL_COMMUNITY)
Admission: EM | Admit: 2021-06-13 | Discharge: 2021-06-14 | Disposition: A | Discharge status: Home / Self Care | Payer: Managed Care, Other (non HMO) | Attending: Emergency Medicine | Admitting: Emergency Medicine

## 2021-06-13 ENCOUNTER — Other Ambulatory Visit: Payer: Self-pay

## 2021-06-13 ENCOUNTER — Emergency Department (HOSPITAL_COMMUNITY): Payer: Managed Care, Other (non HMO)

## 2021-06-13 DIAGNOSIS — Z79899 Other long term (current) drug therapy: Secondary | ICD-10-CM | POA: Diagnosis not present

## 2021-06-13 DIAGNOSIS — R102 Pelvic and perineal pain: Secondary | ICD-10-CM

## 2021-06-13 DIAGNOSIS — I10 Essential (primary) hypertension: Secondary | ICD-10-CM | POA: Insufficient documentation

## 2021-06-13 DIAGNOSIS — E119 Type 2 diabetes mellitus without complications: Secondary | ICD-10-CM | POA: Insufficient documentation

## 2021-06-13 DIAGNOSIS — R0789 Other chest pain: Secondary | ICD-10-CM

## 2021-06-13 DIAGNOSIS — R079 Chest pain, unspecified: Secondary | ICD-10-CM

## 2021-06-13 DIAGNOSIS — Z87891 Personal history of nicotine dependence: Secondary | ICD-10-CM | POA: Insufficient documentation

## 2021-06-13 LAB — URINALYSIS, ROUTINE W REFLEX MICROSCOPIC
Bilirubin Urine: NEGATIVE
Glucose, UA: NEGATIVE mg/dL
Ketones, ur: NEGATIVE mg/dL
Leukocytes,Ua: NEGATIVE
Nitrite: NEGATIVE
Protein, ur: NEGATIVE mg/dL
Specific Gravity, Urine: 1.016 (ref 1.005–1.030)
pH: 6 (ref 5.0–8.0)

## 2021-06-13 LAB — CBC WITH DIFFERENTIAL/PLATELET
Abs Immature Granulocytes: 0.03 10*3/uL (ref 0.00–0.07)
Basophils Absolute: 0 10*3/uL (ref 0.0–0.1)
Basophils Relative: 1 %
Eosinophils Absolute: 0 10*3/uL (ref 0.0–0.5)
Eosinophils Relative: 1 %
HCT: 41.5 % (ref 36.0–46.0)
Hemoglobin: 14 g/dL (ref 12.0–15.0)
Immature Granulocytes: 0 %
Lymphocytes Relative: 35 %
Lymphs Abs: 2.9 10*3/uL (ref 0.7–4.0)
MCH: 31.3 pg (ref 26.0–34.0)
MCHC: 33.7 g/dL (ref 30.0–36.0)
MCV: 92.6 fL (ref 80.0–100.0)
Monocytes Absolute: 0.7 10*3/uL (ref 0.1–1.0)
Monocytes Relative: 8 %
Neutro Abs: 4.6 10*3/uL (ref 1.7–7.7)
Neutrophils Relative %: 55 %
Platelets: 248 10*3/uL (ref 150–400)
RBC: 4.48 MIL/uL (ref 3.87–5.11)
RDW: 12.5 % (ref 11.5–15.5)
WBC: 8.3 10*3/uL (ref 4.0–10.5)
nRBC: 0 % (ref 0.0–0.2)

## 2021-06-13 LAB — COMPREHENSIVE METABOLIC PANEL
ALT: 17 U/L (ref 0–44)
AST: 21 U/L (ref 15–41)
Albumin: 4 g/dL (ref 3.5–5.0)
Alkaline Phosphatase: 60 U/L (ref 38–126)
Anion gap: 9 (ref 5–15)
BUN: 14 mg/dL (ref 6–20)
CO2: 27 mmol/L (ref 22–32)
Calcium: 8.9 mg/dL (ref 8.9–10.3)
Chloride: 101 mmol/L (ref 98–111)
Creatinine, Ser: 1.04 mg/dL — ABNORMAL HIGH (ref 0.44–1.00)
GFR, Estimated: >60 mL/min (ref >60)
Glucose, Bld: 106 mg/dL — ABNORMAL HIGH (ref 70–99)
Potassium: 3.9 mmol/L (ref 3.5–5.1)
Sodium: 137 mmol/L (ref 135–145)
Total Bilirubin: 1.1 mg/dL (ref 0.3–1.2)
Total Protein: 7.2 g/dL (ref 6.5–8.1)

## 2021-06-13 LAB — TROPONIN I (HIGH SENSITIVITY)
Troponin I (High Sensitivity): <2 ng/L (ref <18)
Troponin I (High Sensitivity): <2 ng/L (ref <18)

## 2021-06-13 LAB — I-STAT BETA HCG BLOOD, ED (MC, WL, AP ONLY): I-stat hCG, quantitative: <5 m[IU]/mL (ref <5)

## 2021-06-13 LAB — LIPASE, BLOOD: Lipase: 29 U/L (ref 11–51)

## 2021-06-13 MED ORDER — ACETAMINOPHEN 500 MG PO TABS
1000.0000 mg | ORAL_TABLET | Freq: Once | ORAL | Status: AC
  Administered 2021-06-13: 1000 mg via ORAL
  Filled 2021-06-13: qty 2

## 2021-06-13 NOTE — ED Provider Notes (Signed)
[facility] [place] DEPT Provider Note   CSN: [id number] Arrival date & time: 06/13/21  1810     History Chief Complaint  Patient presents with   Chest Pain   Abdominal Pain    Brooke Turner is a 45 y.o. female.  45 year old female with a history of hypertension, reflux, fibroid uterus presents to the emergency department for complaints of pelvic pain.  Symptoms began 3 hours prior to arrival when she was stepping out of her truck.  Describes a sharp, "ripping" pain from her right groin radiating towards her "urethra".  Pain started to spontaneously improve after about 20 minutes.  She currently reports a residual "soreness".  This is aggravated with movement/position change.  She has not taken any medications for her symptoms and denies a history of similar pain in the past.  Noted some urinary frequency in triage.  Has not had any associated dysuria, hematuria, vaginal complaints, nausea, vomiting, fevers.   As an aside, is reporting intermittent chest pain over the past 5 days that is midsternal and sharp.  Spontaneously resolves after a few minutes.  She has a history of atypical chest pain and is followed by cardiology.  Reassuring prior echocardiograms, Holter monitoring, 0 coronary calcium 2020.  The history is provided by the patient. No language interpreter was used.  Chest Pain Associated symptoms: abdominal pain   Abdominal Pain Associated symptoms: chest pain       Past Medical History:  Diagnosis Date   Anxiety    Back pain    Chest pain    a. 09/2014 Echo: EF 60-65%, no rwma, nl valves;  b. 12/2014 Neg ETT.   Essential hypertension    a. 01/2016 Renal duplex: no RAS- ? renal cyst (renal u/s 4/17 - no cyst, nl study); b. 02/2016 24 hr BP Monitor: Mean SBP ~ 138 with Max of 198. Highest pressures noted in evening hours following placement of cuff.   Fibroid uterus    GERD (gastroesophageal reflux disease)    Palpitations    a. 09/2014 nl  event monitor.    Patient Active Problem List   Diagnosis Date Noted   Gastroesophageal reflux disease without esophagitis 06/03/2020   Type 2 diabetes mellitus without complication, without long-term current use of insulin ([place]) 06/03/2020   Chest discomfort 01/03/2020   Pounding heartbeat 10/25/2019   Costochondral junction syndrome 10/02/2019   OSA (obstructive sleep apnea) 06/11/2019   Encounter for examination following treatment at hospital 04/25/2019   Hypokalemia 04/25/2019   Elevated glucose 04/25/2019   History of posttraumatic stress disorder (PTSD) 12/31/2018   History of palpitations 12/31/2018   Contusion of left knee 07/06/2018   Chronic tension-type headache, not intractable 06/10/2015   Dyspnea 03/18/2015   Post-traumatic stress reaction 02/25/2015   Anxiety disorder due to general medical condition with panic attack 02/25/2015   Fibroid uterus 02/25/2015   Anxiety about health 02/25/2015   Deviated nasal septum 12/30/2014   Chronic rhinitis 12/30/2014   Chronic maxillary sinusitis 12/30/2014   Snorings 09/30/2014   Other chest pain 09/11/2014   Prediabetes 08/26/2014   Acanthosis nigricans 03/19/2014   Vitamin D deficiency 07/15/2013   Large breasts 09/12/2012   Essential hypertension 02/17/2012   Obesity (BMI 30-39.9) previous BMI was over 40. Patient has lost 70 pounds in the last year 02/17/2012   GAD (generalized anxiety disorder) 02/17/2012   Chronic back pain 02/17/2012    Past Surgical History:  Procedure Laterality Date   ESOPHAGOGASTRODUODENOSCOPY  01/2020   WISDOM  TOOTH EXTRACTION       OB History     Gravida  2   Para  0   Term  0   Preterm  0   AB  2   Living  0      SAB  1   IAB  1   Ectopic  0   Multiple  0   Live Births              Family History  Problem Relation Age of Onset   Seizures Mother    Epilepsy Mother    Cancer Father        Liver   Diabetes Father 41   Hypertension Father    Heart disease  Father 79       CEA, LE Stenting   Alzheimer's disease Maternal Grandmother    Heart disease Maternal Grandfather    Colon cancer Neg Hx    Breast cancer Neg Hx     Social History   Tobacco Use   Smoking status: Former    Packs/day: 0.25    Years: 10.00    Pack years: 2.50    Types: Cigarettes    Start date: 06/25/2000    Quit date: 08/19/2014    Years since quitting: 6.8   Smokeless tobacco: Never  Vaping Use   Vaping Use: Never used  Substance Use Topics   Alcohol use: No    Alcohol/week: 0.0 standard drinks   Drug use: No    Home Medications Prior to Admission medications   Medication Sig Start Date End Date Taking? Authorizing Provider  acetaminophen (TYLENOL) 500 MG tablet Take 500 mg by mouth every 6 (six) hours as needed (back pain/ cramps).     [provider]  atenolol (TENORMIN) 25 MG tablet Take 1 tablet by mouth twice daily Patient taking differently: 1/2 tab in the morning and 1/4 at night 01/18/21   Minus Breeding, MD  blood glucose meter kit and supplies Test blood sugar twice daily. Dx code: R73.09 04/25/19   Maryruth Hancock, MD  cholecalciferol (VITAMIN D3) 25 MCG (1000 UNIT) tablet Take 1,000 Units by mouth daily.    [provider]  ELDERBERRY PO Take 1 capsule by mouth daily.    [provider]  famotidine (PEPCID) 20 MG tablet Take 1 tablet (20 mg total) by mouth 2 (two) times daily. 11/20/18   [name], MD  ferrous sulfate 325 (65 FE) MG tablet Take 1 tablet (325 mg total) by mouth every other day. Please take with a source of Vitamin C 06/04/20   Orson Slick, MD  fluticasone [facility]) 50 MCG/ACT nasal spray Place 2 sprays into both nostrils daily. Patient taking differently: Place 2 sprays into both nostrils as needed (seasonal allergies). 10/10/17   Tenna Delaine D, PA-C  glucose blood (ONETOUCH VERIO) test strip TEST BLOOD SUGAR DAILY 09/02/19   Forrest Moron, MD  levalbuterol [facility]) 45 MCG/ACT inhaler  Inhale 1-2 puffs every 6 hours if needed- rescue inhaler 09/18/20   Baird Lyons D, MD  levocetirizine (XYZAL) 5 MG tablet levocetirizine 5 mg tablet  TAKE 1 TABLET BY MOUTH AT BEDTIME FOR 2 WEEKS THEN AS NEEDED    [provider]  LORazepam (ATIVAN) 0.5 MG tablet Take 1 tablet (0.5 mg total) by mouth every 6 (six) hours as needed for anxiety. Patient taking differently: Take 0.25 mg by mouth 3 (three) times daily as needed for anxiety. 03/19/19   Nolon Rod,  Zoe A, MD  Multiple Vitamins-Minerals (MULTIVITAMIN GUMMIES ADULT PO) Take 1 tablet by mouth daily.    [provider]  nitroGLYCERIN (NITROSTAT) 0.4 MG SL tablet Place 1 tablet (0.4 mg total) under the tongue every 5 (five) minutes as needed for chest pain. 03/04/21 06/02/21  Minus Breeding, MD  Norethindrone-Ethinyl Estradiol-Fe Biphas (LO LOESTRIN FE) 1 MG-10 MCG / 10 MCG tablet Take 1 tablet by mouth at bedtime.     [provider]  Jonetta Speak LANCETS 94B MISC USE TO TEST BLOOD SUGAR DAILY 11/16/18   [name], MD  pantoprazole (PROTONIX) 40 MG tablet Take 1 tablet (40 mg total) by mouth 2 (two) times daily. 02/23/21   Thornton Park, MD  Peppermint Oil (IBGARD) 90 MG CPCR Use as directed Patient not taking: Reported on 03/16/2021 06/04/20   Thornton Park, MD  sucralfate (CARAFATE) 1 g tablet TAKE 1 TABLET BY MOUTH 4 TIMES DAILY AS NEEDED --MAKE  A  SLURRY 04/09/21   Thornton Park, MD  vitamin B-12 (CYANOCOBALAMIN) 1000 MCG tablet Take 1,000 mcg by mouth daily.     [provider]    Allergies    Penicillins, Tamiflu [oseltamivir phosphate], Tinidazole, and Diltiazem  Review of Systems   Review of Systems  Cardiovascular:  Positive for chest pain.  Gastrointestinal:  Positive for abdominal pain.  Ten systems reviewed and are negative for acute change, except as noted in the HPI.    Physical Exam Updated Vital Signs BP 140/77 (BP Location: Left Arm)   Pulse 77   Temp 98.3 F (36.8  C) (Oral)   Resp 16   Ht _0  (1.626 m)   Wt 93 kg   SpO2 98%   BMI 35.19 kg/m   Physical Exam Vitals and nursing note reviewed.  Constitutional:      General: She is not in acute distress.    Appearance: She is well-developed. She is not diaphoretic.     Comments: Nontoxic appearing and in NAD  HENT:     Head: Normocephalic and atraumatic.  Eyes:     General: No scleral icterus.    Conjunctiva/sclera: Conjunctivae normal.  Cardiovascular:     Rate and Rhythm: Normal rate and regular rhythm.     Pulses: Normal pulses.  Pulmonary:     Effort: Pulmonary effort is normal. No respiratory distress.     Breath sounds: No stridor. No wheezing.     Comments: Respirations even and unlabored Abdominal:     Palpations: Abdomen is soft. There is no mass.     Tenderness: There is no guarding.     Comments: Minimal suprapubic TTP without guarding, palpable masses. No peritoneal signs. Abdomen soft, obese.  Musculoskeletal:        General: Normal range of motion.     Cervical back: Normal range of motion.  Skin:    General: Skin is warm and dry.     Coloration: Skin is not pale.     Findings: No erythema or rash.  Neurological:     Mental Status: She is alert and oriented to person, place, and time.     Coordination: Coordination normal.  Psychiatric:        Behavior: Behavior normal.    ED Results / Procedures / Treatments   Labs (all labs ordered are listed, but only abnormal results are displayed) Labs Reviewed  COMPREHENSIVE METABOLIC PANEL - Abnormal; Notable for the following components:      Result Value   Glucose, Bld 106 (*)  Creatinine, Ser 1.04 (*)    All other components within normal limits  URINALYSIS, ROUTINE W REFLEX MICROSCOPIC - Abnormal; Notable for the following components:   Hgb urine dipstick MODERATE (*)    Bacteria, UA RARE (*)    All other components within normal limits  CBC WITH DIFFERENTIAL/PLATELET  LIPASE, BLOOD  I-STAT BETA HCG BLOOD, ED  ([facility], [facility], [facility] ONLY)  TROPONIN I (HIGH SENSITIVITY)  TROPONIN I (HIGH SENSITIVITY)    EKG None  Radiology DG Chest 1 View  Result Date: 06/13/2021 CLINICAL DATA:  Left anterior chest pain EXAM: CHEST  1 VIEW COMPARISON:  03/16/2021 FINDINGS: The heart size and mediastinal contours are within normal limits. Both lungs are clear. The visualized skeletal structures are unremarkable. IMPRESSION: Normal study. Electronically Signed   By: Rolm Baptise M.D.   On: 06/13/2021 20:26   US Transvaginal Non-OB  Result Date: 06/13/2021 CLINICAL DATA:  Pelvic pain EXAM: TRANSABDOMINAL AND TRANSVAGINAL ULTRASOUND OF PELVIS DOPPLER ULTRASOUND OF OVARIES TECHNIQUE: Both transabdominal and transvaginal ultrasound examinations of the pelvis were performed. Transabdominal technique was performed for global imaging of the pelvis including uterus, ovaries, adnexal regions, and pelvic cul-de-sac. It was necessary to proceed with endovaginal exam following the transabdominal exam to visualize the uterus, endometrium, ovaries and adnexa. Color and duplex Doppler ultrasound was utilized to evaluate blood flow to the ovaries. COMPARISON:  07/18/2019 FINDINGS: Uterus Measurements: 9.0 x 5.5 x 7.4 cm = volume: 191 mL. Multiple fibroids throughout the uterus. Largest is a central fibroid measuring up to 4.7 cm. Anterior right body fibroid measures up to 4.2 cm. Left fundal fibroid measures up to 3 cm. Endometrium Thickness: Normal thickness, 2 mm.  No focal abnormality visualized. Right ovary Measurements: 3.8 x 2.5 x 2.4 cm = volume: 12.1 mL. Normal appearance/no adnexal mass. Dominant follicle noted. Left ovary Measurements: 4.1 x 1.6 x 2.6 cm = volume: 9.1 mL. Normal appearance/no adnexal mass. Pulsed Doppler evaluation of both ovaries demonstrates normal low-resistance arterial and venous waveforms. Other findings No abnormal free fluid. IMPRESSION: Fibroid uterus. No acute findings. Electronically Signed   By: Rolm Baptise M.D.    On: 06/13/2021 19:58   US Pelvis Complete  Result Date: 06/13/2021 CLINICAL DATA:  Pelvic pain EXAM: TRANSABDOMINAL AND TRANSVAGINAL ULTRASOUND OF PELVIS DOPPLER ULTRASOUND OF OVARIES TECHNIQUE: Both transabdominal and transvaginal ultrasound examinations of the pelvis were performed. Transabdominal technique was performed for global imaging of the pelvis including uterus, ovaries, adnexal regions, and pelvic cul-de-sac. It was necessary to proceed with endovaginal exam following the transabdominal exam to visualize the uterus, endometrium, ovaries and adnexa. Color and duplex Doppler ultrasound was utilized to evaluate blood flow to the ovaries. COMPARISON:  07/18/2019 FINDINGS: Uterus Measurements: 9.0 x 5.5 x 7.4 cm = volume: 191 mL. Multiple fibroids throughout the uterus. Largest is a central fibroid measuring up to 4.7 cm. Anterior right body fibroid measures up to 4.2 cm. Left fundal fibroid measures up to 3 cm. Endometrium Thickness: Normal thickness, 2 mm.  No focal abnormality visualized. Right ovary Measurements: 3.8 x 2.5 x 2.4 cm = volume: 12.1 mL. Normal appearance/no adnexal mass. Dominant follicle noted. Left ovary Measurements: 4.1 x 1.6 x 2.6 cm = volume: 9.1 mL. Normal appearance/no adnexal mass. Pulsed Doppler evaluation of both ovaries demonstrates normal low-resistance arterial and venous waveforms. Other findings No abnormal free fluid. IMPRESSION: Fibroid uterus. No acute findings. Electronically Signed   By: Rolm Baptise M.D.   On: 06/13/2021 19:58   [place] Art/Ven Flow Abd  Pelv Doppler  Result Date: 06/13/2021 CLINICAL DATA:  Pelvic pain EXAM: TRANSABDOMINAL AND TRANSVAGINAL ULTRASOUND OF PELVIS DOPPLER ULTRASOUND OF OVARIES TECHNIQUE: Both transabdominal and transvaginal ultrasound examinations of the pelvis were performed. Transabdominal technique was performed for global imaging of the pelvis including uterus, ovaries, adnexal regions, and pelvic cul-de-sac. It was necessary to proceed  with endovaginal exam following the transabdominal exam to visualize the uterus, endometrium, ovaries and adnexa. Color and duplex Doppler ultrasound was utilized to evaluate blood flow to the ovaries. COMPARISON:  07/18/2019 FINDINGS: Uterus Measurements: 9.0 x 5.5 x 7.4 cm = volume: 191 mL. Multiple fibroids throughout the uterus. Largest is a central fibroid measuring up to 4.7 cm. Anterior right body fibroid measures up to 4.2 cm. Left fundal fibroid measures up to 3 cm. Endometrium Thickness: Normal thickness, 2 mm.  No focal abnormality visualized. Right ovary Measurements: 3.8 x 2.5 x 2.4 cm = volume: 12.1 mL. Normal appearance/no adnexal mass. Dominant follicle noted. Left ovary Measurements: 4.1 x 1.6 x 2.6 cm = volume: 9.1 mL. Normal appearance/no adnexal mass. Pulsed Doppler evaluation of both ovaries demonstrates normal low-resistance arterial and venous waveforms. Other findings No abnormal free fluid. IMPRESSION: Fibroid uterus. No acute findings. Electronically Signed   By: Rolm Baptise M.D.   On: 06/13/2021 19:58    Procedures Procedures   Medications Ordered in ED Medications  acetaminophen (TYLENOL) tablet 1,000 mg (1,000 mg Oral Given 06/13/21 2257)    ED Course  I have reviewed the triage vital signs and the nursing notes.  Pertinent labs & imaging results that were available during my care of the patient were reviewed by me and considered in my medical decision making (see chart for details).    MDM Rules/Calculators/A&P                           45 year old female with sudden onset pelvic pain prior to arrival lasting approximately 20 minutes before spontaneously improving.  Describes a residual soreness.  Has minimal suprapubic tenderness on exam.  No peritoneal signs.  Urinalysis negative for urinary tract infection.  No hematuria to suggest kidney stone.  Kidney function is also preserved.  Pregnancy is negative ruling out ectopic.  She underwent pelvic ultrasound which is  negative for ovarian torsion, abscess, hemorrhagic cyst.  No free fluid in the pelvis seen.  As an aside complaining of 5 days of intermittent fleeting chest pain.  Troponin negative x2.  No acute ischemia on EKG.  She is denies chest pain at present.  Her symptoms are atypical of ACS.  She has had prior cardiac work-ups which have been reassuring.  Low suspicion for emergent cause of symptoms.  Stable for follow-up with her primary doctor.  Encouraged supportive care with Tylenol or Motrin.  Return precautions discussed and provided. Patient discharged in stable condition with no unaddressed concerns.   Final Clinical Impression(s) / ED Diagnoses Final diagnoses:  Pelvic pain  Atypical chest pain    Rx / DC Orders ED Discharge Orders     None        Antonietta Breach, PA-C 06/14/21 0530    Fatima Blank, MD 06/14/21 2002

## 2021-06-13 NOTE — ED Triage Notes (Signed)
Intermittent CP and Nausea x5 days. Today when patient went to get out of car had sudden onset of lower abd pain with weakness and urinary frequency. Denies dizziness, shob, headache.

## 2021-06-13 NOTE — Discharge Instructions (Addendum)
Your work-up in the emergency department has been reassuring.  We recommend follow-up with your primary care doctor.  Return for new or concerning symptoms.

## 2021-06-13 NOTE — ED Notes (Signed)
US at bedside

## 2021-06-13 NOTE — ED Provider Notes (Signed)
Emergency Medicine Provider Triage Evaluation Note  Brooke Turner , a 45 y.o. female  was evaluated in triage.  Pt complains of sudden onset of pelvic pain.  Patient reports sudden onset of pelvic pain approximate 3 hours prior while getting out of car.  Pain was throughout her entire pelvis.  Patient reports that pain has improved however has been constant since onset.  Patient endorses urinary frequency.  Denies any dysuria, hematuria, vaginal bleeding, vaginal discharge, vaginal pain.  Patient reports that she has been having nausea and intermittent chest pain over the last 5 days.  Patient reports that chest pain is midsternal.  Described as sharp.  States that it is present for a few moments and then resolves.  Last had this pain yesterday evening.  Review of Systems  Positive: Pelvic pain, urinary frequency, chest pain, nausea Negative: dysuria, hematuria, vaginal bleeding, vaginal discharge, vaginal pain, shortness of breath  Physical Exam  BP (!) 169/84 (BP Location: Right Arm)   Pulse 77   Temp 98.2 F (36.8 C) (Oral)   Resp 18   Ht '5\' 4"'$  (1.626 m)   Wt 93 kg   SpO2 100%   BMI 35.19 kg/m  Gen:   Awake, no distress   Resp:  Normal effort  MSK:   Moves extremities without difficulty  Other:  Abdomen soft, nondistended, tenderness to suprapubic area, no guarding or rebound tenderness.  Medical Decision Making  Medically screening exam initiated at 6:49 PM.  Appropriate orders placed.  Jhoanna Felice-Jennings was informed that the remainder of the evaluation will be completed by another provider, this initial triage assessment does not replace that evaluation, and the importance of remaining in the ED until their evaluation is complete.  The patient appears stable so that the remainder of the work up may be completed by another provider.      Dyann Ruddle 06/13/21 Yevette Edwards    Isla Pence, MD 06/13/21 2013

## 2021-06-18 ENCOUNTER — Telehealth: Payer: Self-pay | Admitting: Neurology

## 2021-06-18 NOTE — Telephone Encounter (Signed)
Left message to contact office regarding eeg

## 2021-06-18 NOTE — Telephone Encounter (Signed)
She just had a sleep study in December 2021, insurance will not approve another sleep study and she does not need it. Has she seen her eye doctor? Is she still having headaches? We can start with an EEG for to evaluate the disorientation episodes, pls order routine EEG if she is agreeable, and if normal, we will do an overnight EEG (different from a sleep study), to see what the brain waves show when she is waking up disoriented. Thanks

## 2021-06-18 NOTE — Telephone Encounter (Signed)
Pt called in stating she is having an eye hemorrhage again. There are spots in her eye that looks like an explosion in her eye. Her eye doctor checked her eyes and her vision is fine. She is also waking up in the middle of the night disoriented with her heart pounding. She has seen a cardiologist and they said he heart is fine. They suggested she check with Dr. Delice Lesch to see if she needs another sleep study. It's happening so often that she is not getting good sleep. She is worried she might have a stroke.

## 2021-06-21 NOTE — Progress Notes (Signed)
Cardiology Office Note:    Date:  [id number]   ID:  Brooke Turner, DOB [id number], MRN [id number]  PCP:  [name], MD   [facility] Providers Cardiologist:  Minus Breeding, MD Cardiology APP:  Lendon Colonel, NP  Electrophysiologist:  Virl Axe, MD      Referring MD: [name], MD   Follow-up for palpitations  History of Present Illness:    Brooke Turner is a 45 y.o. female with a hx of anxiety, back pain, chest pain (echocardiogram 11/15 EF 60-65%, 2/16 negative ETT.),  Essential hypertension, GERD, and palpitations.   She was last seen by Dr. Percival Spanish on 03/26/2020.  During that time she was seen for follow-up evaluation of her chest pain.  She did not tolerate 60 mg of Imdur.  Her dose was reduced to 15 mg which she tolerated much better.  She was also started on pantoprazole which helped improved her chest discomfort.  She denied shortness of breath, PND, orthopnea, palpitations, presyncope, syncope, lower extremity edema, and weight gain.   She walked in the clinic 08/21/2020 and indicated she had been having episodes of fast heartbeat for several months.  She noticed increased episodes when she had a lot of gas.   She presented to the clinic 08/25/2020 for evaluation and stated over the past several weeks she had noticed increasing episodes of irregular heart rate.  She stated that these episodes come on just prior to belching and subside.  She indicated that she also has a family history of elevated lipoprotein a and was tested.  She was having the results faxed to our office.  She denied use of caffeine and triggers that may cause palpitations.  She also noticed intermittent periods of lightheadedness not associated with palpitations.  We reviewed triggers for palpitations.  I  ordered a 14-day ZIO monitor and fasting lipid panel.   Follow-up was planned for 8 weeks.  Her cardiac event monitor showed no arrhythmias.  Her symptoms occurred during  normal sinus rhythm.  Her event monitor showed normal sinus rhythm, rare atrial ectopy, and no sustained arrhythmias   She presented to the clinic 11/25/2020 for follow-up evaluation stated she had noticed some increased activity intolerance and shortness of breath since last fall.  She had decreased her physical activity and noticed that she was not able to do the physical activity that she was able to do prior to last fall.  We reviewed her cardiac event monitor results and I reassured her that her episodes of palpitations related to increased gas did not show cardiac issues.  She expressed understanding.  I have encouraged her to keep a food log and cut out foods that cause her symptoms/issues. I order an echocardiogram to follow-up on her dyspnea with exertion.   Her follow-up in 3 months was planned, gave her the salty 6 diet sheet, and asked her to increase her physical activity as tolerated.  Her echocardiogram showed normal pumping function, no valvular abnormalities on 01/05/2021.  She was seen by Dr. Percival Spanish for chest pain and palpitations.  She had a CT that did not show PE.  A small hiatal hernia was noted.  She reported that she had occasional episodes of chest discomfort and occasional episodes of palpitations.  She had increased her physical activity slightly.  She denied episodes of new chest pressure arm and neck discomfort.  She was not having any shortness of breath PND orthopnea.  And she denied presyncope and syncope.  Her coronary  calcium score in 2020 was 0.  Due to her lack of change in symptomology no further ischemic evaluation was ordered.  She was given sublingual nitroglycerin for as needed use.  She presents to the clinic today for follow-up evaluation states she has been noticing intermittent periods of sharp sporadic chest pain.  She reports this chest pain comes on at rest lasts for seconds and then dissipates without intervention.  She reports that she has been around 30 pounds  in the last 18 months.  We reviewed her previous echocardiogram, coronary calcium scoring, cardiac event monitors, and recent labs from the emergency department.  She also reports that she has been having occasional periods of palpitations.  I will have her increase her physical activity as tolerated, draw a TSH and magnesium level, and have her follow-up in 4 to 6 months.   Today she denies chest pain, lower extremity edema, fatigue, palpitations, melena, hematuria, hemoptysis, diaphoresis, weakness, presyncope, syncope, orthopnea, and PND.  Past Medical History:  Diagnosis Date   Anxiety    Back pain    Chest pain    a. 09/2014 Echo: EF 60-65%, no rwma, nl valves;  b. 12/2014 Neg ETT.   Essential hypertension    a. 01/2016 Renal duplex: no RAS- ? renal cyst (renal u/s 4/17 - no cyst, nl study); b. 02/2016 24 hr BP Monitor: Mean SBP ~ 138 with Max of 198. Highest pressures noted in evening hours following placement of cuff.   Fibroid uterus    GERD (gastroesophageal reflux disease)    Palpitations    a. 09/2014 nl event monitor.    Past Surgical History:  Procedure Laterality Date   ESOPHAGOGASTRODUODENOSCOPY  01/2020   WISDOM TOOTH EXTRACTION      Current Medications: No outpatient medications have been marked as taking for the 06/22/21 encounter (Appointment) with Deberah Pelton, NP.     Allergies:   Penicillins, Tamiflu [oseltamivir phosphate], Tinidazole, and Diltiazem   Social History   Socioeconomic History   Marital status: Single    Spouse name: N/A   Number of children: 0   Years of education: 16   Highest education level: Not on file  Occupational History   Occupation: Research scientist (physical sciences): [employer]  Tobacco Use   Smoking status: Former    Packs/day: 0.25    Years: 10.00    Pack years: 2.50    Types: Cigarettes    Start date: 06/25/2000    Quit date: 08/19/2014    Years since quitting: 6.8   Smokeless tobacco: Never  Vaping Use   Vaping  Use: Never used  Substance and Sexual Activity   Alcohol use: No    Alcohol/week: 0.0 standard drinks   Drug use: No   Sexual activity: Not Currently    Partners: Male    Birth control/protection: Pill  Other Topics Concern   Not on file  Social History Narrative   Patient lives at home alone .   Patient works full time at [employer]   Education college.   Right handed.   Caffeine none   Social Determinants of Health   Financial Resource Strain: Not on file  Food Insecurity: Not on file  Transportation Needs: Not on file  Physical Activity: Not on file  Stress: Not on file  Social Connections: Not on file     Family History: The patient's family history includes Alzheimer's disease in her maternal grandmother; Cancer in her father; Diabetes (age of onset: 64) in  her father; Epilepsy in her mother; Heart disease in her maternal grandfather; Heart disease (age of onset: 72) in her father; Hypertension in her father; Seizures in her mother. There is no history of Colon cancer or Breast cancer.  ROS:   Please see the history of present illness.     All other systems reviewed and are negative.   Risk Assessment/Calculations:           Physical Exam:    VS:  There were no vitals taken for this visit.    Wt Readings from Last 3 Encounters:  06/13/21 205 lb 0.4 oz (93 kg)  05/05/21 206 lb 3.2 oz (93.5 kg)  03/16/21 170 lb (77.1 kg)     GEN:  Well nourished, well developed in no acute distress HEENT: Normal NECK: No JVD; No carotid bruits LYMPHATICS: No lymphadenopathy CARDIAC: RRR, no murmurs, rubs, gallops RESPIRATORY:  Clear to auscultation without rales, wheezing or rhonchi  ABDOMEN: Soft, non-tender, non-distended MUSCULOSKELETAL:  No edema; No deformity  SKIN: Warm and dry NEUROLOGIC:  Alert and oriented x 3 PSYCHIATRIC:  Normal affect    EKGs/Labs/Other Studies Reviewed:    The following studies were reviewed today: Cardiac event monitor 10/26/2020 Normal  sinus rhythm Rare atrial ectopy Multiple symptoms of chest pain, fluttering and skipped beats occur during normal sinus rhythm There are no sustained arrhythmias  Echocardiogram 01/05/2021 IMPRESSIONS     1. Left ventricular ejection fraction, by estimation, is 60 to 65%. The  left ventricle has normal function. The left ventricle has no regional  wall motion abnormalities. Left ventricular diastolic parameters were  normal.   2. Right ventricular systolic function is normal. The right ventricular  size is normal. There is normal pulmonary artery systolic pressure.   3. The mitral valve is normal in structure. No evidence of mitral valve  regurgitation. No evidence of mitral stenosis.   4. The aortic valve is tricuspid. Aortic valve regurgitation is not  visualized. Moderate to severe aortic valve stenosis.   5. The inferior vena cava is normal in size with greater than 50%  respiratory variability, suggesting right atrial pressure of 3 mmHg.  EKG: None today.  Recent Labs: 06/13/2021: ALT 17; BUN 14; Creatinine, Ser 1.04; Hemoglobin 14.0; Platelets 248; Potassium 3.9; Sodium 137  Recent Lipid Panel    Component Value Date/Time   CHOL 169 08/25/2020 1221   TRIG 86 08/25/2020 1221   HDL 51 08/25/2020 1221   CHOLHDL 3.3 08/25/2020 1221   LDLCALC 102 (H) 08/25/2020 1221   LDLDIRECT 101 (H) 10/25/2016 1850    ASSESSMENT & PLAN    Palpitations-occasional intermittent fast heartbeats.  Continue atenolol Heart healthy low-sodium diet-salty 6 given Increase physical activity as tolerated Avoid triggers caffeine, chocolate, EtOH etc. TSH, Mag Continue to monitor   Chest pain/precordial pain- no pain today.  Has had sharp brief intermittent periods of chest discomfort over the last few days that resolved without intervention and come on while she is at rest.  Previously placed on 60 mg of Imdur which she did not tolerate.   Continue  Protonix, nitroglycerin as needed Heart healthy  low-sodium diet Increase physical activity Maintain p.o. hydration  Hyperlipidemia-08/25/2020: Cholesterol, Total 169; HDL 51; LDL Chol Calc (NIH) 102; Triglycerides 86 Heart healthy low-sodium high-fiber diet Increase physical activity as tolerated       Disposition: Follow-up with Dr. Percival Spanish in  4-6 months.        Medication Adjustments/Labs and Tests Ordered: Current medicines are reviewed at  length with the patient today.  Concerns regarding medicines are outlined above.  No orders of the defined types were placed in this encounter.  No orders of the defined types were placed in this encounter.   There are no Patient Instructions on file for this visit.   Signed, Deberah Pelton, NP  06/21/2021 8:02 AM      Notice: This dictation was prepared with Dragon dictation along with smaller phrase technology. Any transcriptional errors that result from this process are unintentional and may not be corrected upon review.  I spent 15 minutes examining this patient, reviewing medications, and using patient centered shared decision making involving her cardiac care.  Prior to her visit I spent greater than 20 minutes reviewing her past medical history,  medications, and prior cardiac tests.

## 2021-06-22 ENCOUNTER — Ambulatory Visit (INDEPENDENT_AMBULATORY_CARE_PROVIDER_SITE_OTHER): Payer: Self-pay | Admitting: General Practice

## 2021-06-22 ENCOUNTER — Encounter (HOSPITAL_BASED_OUTPATIENT_CLINIC_OR_DEPARTMENT_OTHER): Payer: Self-pay | Admitting: General Practice

## 2021-06-22 ENCOUNTER — Other Ambulatory Visit: Payer: Self-pay

## 2021-06-22 VITALS — BP 132/82 | HR 69 | Ht 64.0 in | Wt 201.0 lb

## 2021-06-22 DIAGNOSIS — R0789 Other chest pain: Secondary | ICD-10-CM | POA: Diagnosis not present

## 2021-06-22 DIAGNOSIS — R002 Palpitations: Secondary | ICD-10-CM

## 2021-06-22 DIAGNOSIS — E785 Hyperlipidemia, unspecified: Secondary | ICD-10-CM

## 2021-06-22 NOTE — Telephone Encounter (Signed)
Left message again for patient to contact office 06/22/2021 at 924

## 2021-06-22 NOTE — Patient Instructions (Signed)
Medication Instructions:   Your physician recommends that you continue on your current medications as directed. Please refer to the Current Medication list given to you today.  *If you need a refill on your cardiac medications before your next appointment, please call your pharmacy*   Lab Work: Please refer to the paperwork given today for labs.   If you have labs (blood work) drawn today and your tests are completely normal, you will receive your results only by: [place] (if you have MyChart) OR A paper copy in the mail If you have any lab test that is abnormal or we need to change your treatment, we will call you to review the results.   Testing/Procedures:  -NONE  Follow-Up: At [facility], you and your health needs are our priority.  As part of our continuing mission to provide you with exceptional heart care, we have created designated Provider Care Teams.  These Care Teams include your primary Cardiologist (physician) and Advanced Practice Providers (APPs -  Physician Assistants and Nurse Practitioners) who all work together to provide you with the care you need, when you need it.  We recommend signing up for the patient portal called "MyChart".  Sign up information is provided on this After Visit Summary.  MyChart is used to connect with patients for Virtual Visits (Telemedicine).  Patients are able to view lab/test results, encounter notes, upcoming appointments, etc.  Non-urgent messages can be sent to your provider as well.   To learn more about what you can do with MyChart, go to [email or web address].    Your next appointment:   4 month(s) with Dr. Percival Spanish on Friday, December 16 @ 4:20 pm.   The format for your next appointment:   In Person  Provider:   Minus Breeding, MD   Other Instructions   Increase hydration  Keep up physical activity as tolerated.

## 2021-06-23 ENCOUNTER — Telehealth: Payer: Self-pay | Admitting: Cardiology

## 2021-06-23 ENCOUNTER — Other Ambulatory Visit: Payer: Self-pay | Admitting: Hematology and Oncology

## 2021-06-23 NOTE — Telephone Encounter (Signed)
Patient states she was in the office yesterday and mentioned she has been having chest pain. She states they did not do an EKG and she would like a call back to discuss why. She would like to know if she can come to the [facility] office to have one done.

## 2021-06-23 NOTE — Telephone Encounter (Signed)
Discussed w/JC, FNP-C Pt's chest pain is not cardiac related.

## 2021-06-23 NOTE — Telephone Encounter (Signed)
Brooke Chamber,   Ms. Brooke Turner chest discomfort was atypical in nature. She had recently been seen in the ED and work up was unremarkable. Yesterday, we ordered TSH and magnesium. She did not tolerate Imdur d/t HA. She has also had a calcium score of 0 on 8/20. No further testing is needed at this time.   Thank you,   JC  Her pain is located behind left breast, it is intermittent thru-out the day. She states that it is ongoing and intermittent thru-out the day she states that it happens  >20x/day and it happens both at rest and exertional and upon inspiration sharp it has been happening for more than X5 days she states that this chest pain is different than when she was last in the ER. She discussed this with Denyse Amass but still did not do a EKG. She states that is tempted to go to the ER. Please advise

## 2021-06-23 NOTE — Telephone Encounter (Signed)
Message routed to [facility] NP & Sharyn Lull LPN regarding [id number] visit and patient's concern about not having an EKG  She had an EKG in ED on 06/13/21

## 2021-06-30 ENCOUNTER — Encounter (HOSPITAL_BASED_OUTPATIENT_CLINIC_OR_DEPARTMENT_OTHER): Payer: Self-pay

## 2021-06-30 LAB — MAGNESIUM: Magnesium: 2.1 mg/dL (ref 1.6–2.3)

## 2021-06-30 LAB — TSH: TSH: 1.13 u[IU]/mL (ref 0.450–4.500)

## 2021-06-30 NOTE — Telephone Encounter (Signed)
Left message again to call office, if no return call will send letter08/25/2022

## 2021-07-01 ENCOUNTER — Other Ambulatory Visit: Payer: Self-pay

## 2021-07-01 DIAGNOSIS — R410 Disorientation, unspecified: Secondary | ICD-10-CM

## 2021-07-01 NOTE — Telephone Encounter (Signed)
Please schedule. Thank you

## 2021-07-01 NOTE — Telephone Encounter (Signed)
Finally got patient on phone, would like to do EEG. Okay to order this now?

## 2021-07-01 NOTE — Telephone Encounter (Signed)
Left message again  [phone] to call in for results.

## 2021-07-01 NOTE — Telephone Encounter (Signed)
Pt will call back to schedule a routine EEG

## 2021-07-01 NOTE — Telephone Encounter (Signed)
Pls order routine 30-min EEG, thanks

## 2021-07-05 ENCOUNTER — Telehealth: Payer: Self-pay | Admitting: Cardiology

## 2021-07-05 NOTE — Telephone Encounter (Signed)
Follow Up:      Patient was calling back to see what she needs to do.

## 2021-07-05 NOTE — Telephone Encounter (Signed)
Pt c/o of Chest Pain: STAT if CP now or developed within 24 hours  1. Are you having CP right now? Burning sensation in her chest  2. Are you experiencing any other symptoms (ex. SOB, nausea, vomiting, sweating)? Nausea and coughing  3. How long have you been experiencing CP? Started on 07-01-21  4. Is your CP continuous or coming and going? stays  5. Have you taken Nitroglycerin? no ?

## 2021-07-05 NOTE — Telephone Encounter (Signed)
Pt aware and appt made for 07/30/21 at 10:00 am  with Dr Percival Spanish .Adonis Housekeeper

## 2021-07-05 NOTE — Telephone Encounter (Signed)
Spoke with pt re message and pt has noted continuous chest burning since last Wed  After reviewing records appears this is not heart related and pt feels it is Asked pt if had taken ntg and has not Per pt thought it was chest pain Instructed to take this as directed to see if helps and can always go to ED for eval and tx . Will forward to Dr Percival Spanish for review and recommendations ./cy

## 2021-07-05 NOTE — Telephone Encounter (Signed)
Pt states that someone just called her to schedule an appt to discuss. Scheduled her 4 mo f/u appt in feb

## 2021-07-06 ENCOUNTER — Encounter (HOSPITAL_COMMUNITY): Payer: Self-pay

## 2021-07-06 ENCOUNTER — Other Ambulatory Visit: Payer: Self-pay

## 2021-07-06 ENCOUNTER — Emergency Department (HOSPITAL_COMMUNITY)
Admission: EM | Admit: 2021-07-06 | Discharge: 2021-07-06 | Disposition: A | Discharge status: Home / Self Care | Payer: Managed Care, Other (non HMO) | Attending: Emergency Medicine | Admitting: Emergency Medicine

## 2021-07-06 ENCOUNTER — Emergency Department (HOSPITAL_COMMUNITY): Payer: Managed Care, Other (non HMO)

## 2021-07-06 DIAGNOSIS — Z87891 Personal history of nicotine dependence: Secondary | ICD-10-CM | POA: Diagnosis not present

## 2021-07-06 DIAGNOSIS — E119 Type 2 diabetes mellitus without complications: Secondary | ICD-10-CM | POA: Insufficient documentation

## 2021-07-06 DIAGNOSIS — R079 Chest pain, unspecified: Secondary | ICD-10-CM | POA: Diagnosis present

## 2021-07-06 DIAGNOSIS — Z79899 Other long term (current) drug therapy: Secondary | ICD-10-CM | POA: Insufficient documentation

## 2021-07-06 DIAGNOSIS — I10 Essential (primary) hypertension: Secondary | ICD-10-CM | POA: Diagnosis not present

## 2021-07-06 DIAGNOSIS — R0981 Nasal congestion: Secondary | ICD-10-CM | POA: Diagnosis not present

## 2021-07-06 DIAGNOSIS — K2100 Gastro-esophageal reflux disease with esophagitis, without bleeding: Secondary | ICD-10-CM

## 2021-07-06 DIAGNOSIS — Z20822 Contact with and (suspected) exposure to covid-19: Secondary | ICD-10-CM | POA: Diagnosis not present

## 2021-07-06 LAB — CBC
HCT: 37.5 % (ref 36.0–46.0)
Hemoglobin: 12.8 g/dL (ref 12.0–15.0)
MCH: 31.1 pg (ref 26.0–34.0)
MCHC: 34.1 g/dL (ref 30.0–36.0)
MCV: 91 fL (ref 80.0–100.0)
Platelets: 275 10*3/uL (ref 150–400)
RBC: 4.12 MIL/uL (ref 3.87–5.11)
RDW: 12.4 % (ref 11.5–15.5)
WBC: 10.5 10*3/uL (ref 4.0–10.5)
nRBC: 0 % (ref 0.0–0.2)

## 2021-07-06 LAB — BASIC METABOLIC PANEL
Anion gap: 7 (ref 5–15)
BUN: 18 mg/dL (ref 6–20)
CO2: 25 mmol/L (ref 22–32)
Calcium: 9.1 mg/dL (ref 8.9–10.3)
Chloride: 109 mmol/L (ref 98–111)
Creatinine, Ser: 1.13 mg/dL — ABNORMAL HIGH (ref 0.44–1.00)
GFR, Estimated: >60 mL/min (ref >60)
Glucose, Bld: 92 mg/dL (ref 70–99)
Potassium: 3.4 mmol/L — ABNORMAL LOW (ref 3.5–5.1)
Sodium: 141 mmol/L (ref 135–145)

## 2021-07-06 LAB — RESP PANEL BY RT-PCR (FLU A&B, COVID) ARPGX2
Influenza A by PCR: NEGATIVE
Influenza B by PCR: NEGATIVE
SARS Coronavirus 2 by RT PCR: NEGATIVE

## 2021-07-06 LAB — I-STAT BETA HCG BLOOD, ED (MC, WL, AP ONLY): I-stat hCG, quantitative: <5 m[IU]/mL (ref <5)

## 2021-07-06 LAB — TROPONIN I (HIGH SENSITIVITY): Troponin I (High Sensitivity): <2 ng/L (ref <18)

## 2021-07-06 MED ORDER — ALUM & MAG HYDROXIDE-SIMETH 200-200-20 MG/5ML PO SUSP
30.0000 mL | Freq: Once | ORAL | Status: AC
  Administered 2021-07-06: 30 mL via ORAL
  Filled 2021-07-06: qty 30

## 2021-07-06 MED ORDER — LIDOCAINE VISCOUS HCL 2 % MT SOLN
15.0000 mL | Freq: Once | OROMUCOSAL | Status: AC
  Administered 2021-07-06: 15 mL via ORAL
  Filled 2021-07-06: qty 15

## 2021-07-06 NOTE — ED Notes (Signed)
Will collect labs after xray

## 2021-07-06 NOTE — ED Provider Notes (Signed)
[facility] DEPT Provider Note   CSN: [id number] Arrival date & time: 07/06/21  0157     History Chief Complaint  Patient presents with   chest burning    Brooke Turner is a 45 y.o. female.  Patient to ED with complaint of a burning sensation in her chest that goes down the center. No nausea, vomiting. No fever. She reports history of 'esophagitis' for which she takes Protonix. She has tried using Tums on multiple occasions over the last 4 days since symptoms started. She has some nasal congestion but does not feel her chest is congested. She states she spoke to her cardiologist who recommended that she be seen. She reports she sees a cardiologist for episodes in the past of palpitations and chest pain but denies every having a catheterization.   The history is provided by the patient. No language interpreter was used.      Past Medical History:  Diagnosis Date   Anxiety    Back pain    Chest pain    a. 09/2014 Echo: EF 60-65%, no rwma, nl valves;  b. 12/2014 Neg ETT.   Essential hypertension    a. 01/2016 Renal duplex: no RAS- ? renal cyst (renal u/s 4/17 - no cyst, nl study); b. 02/2016 24 hr BP Monitor: Mean SBP ~ 138 with Max of 198. Highest pressures noted in evening hours following placement of cuff.   Fibroid uterus    GERD (gastroesophageal reflux disease)    Palpitations    a. 09/2014 nl event monitor.    Patient Active Problem List   Diagnosis Date Noted   Gastroesophageal reflux disease without esophagitis 06/03/2020   Type 2 diabetes mellitus without complication, without long-term current use of insulin ([place]) 06/03/2020   Chest discomfort 01/03/2020   Pounding heartbeat 10/25/2019   Costochondral junction syndrome 10/02/2019   OSA (obstructive sleep apnea) 06/11/2019   Encounter for examination following treatment at hospital 04/25/2019   Hypokalemia 04/25/2019   Elevated glucose 04/25/2019   History of posttraumatic  stress disorder (PTSD) 12/31/2018   History of palpitations 12/31/2018   Contusion of left knee 07/06/2018   Chronic tension-type headache, not intractable 06/10/2015   Dyspnea 03/18/2015   Post-traumatic stress reaction 02/25/2015   Anxiety disorder due to general medical condition with panic attack 02/25/2015   Fibroid uterus 02/25/2015   Anxiety about health 02/25/2015   Deviated nasal septum 12/30/2014   Chronic rhinitis 12/30/2014   Chronic maxillary sinusitis 12/30/2014   Snorings 09/30/2014   Other chest pain 09/11/2014   Prediabetes 08/26/2014   Acanthosis nigricans 03/19/2014   Vitamin D deficiency 07/15/2013   Large breasts 09/12/2012   Essential hypertension 02/17/2012   Obesity (BMI 30-39.9) previous BMI was over 40. Patient has lost 70 pounds in the last year 02/17/2012   GAD (generalized anxiety disorder) 02/17/2012   Chronic back pain 02/17/2012    Past Surgical History:  Procedure Laterality Date   ESOPHAGOGASTRODUODENOSCOPY  01/2020   WISDOM TOOTH EXTRACTION       OB History     Gravida  2   Para  0   Term  0   Preterm  0   AB  2   Living  0      SAB  1   IAB  1   Ectopic  0   Multiple  0   Live Births              Family History  Problem Relation Age  of Onset   Seizures Mother    Epilepsy Mother    Cancer Father        Liver   Diabetes Father 48   Hypertension Father    Heart disease Father 59       CEA, LE Stenting   Alzheimer's disease Maternal Grandmother    Heart disease Maternal Grandfather    Colon cancer Neg Hx    Breast cancer Neg Hx     Social History   Tobacco Use   Smoking status: Former    Packs/day: 0.25    Years: 10.00    Pack years: 2.50    Types: Cigarettes    Start date: 06/25/2000    Quit date: 08/19/2014    Years since quitting: 6.8   Smokeless tobacco: Never  Vaping Use   Vaping Use: Never used  Substance Use Topics   Alcohol use: No    Alcohol/week: 0.0 standard drinks   Drug use: No     Home Medications Prior to Admission medications   Medication Sig Start Date End Date Taking? Authorizing Provider  acetaminophen (TYLENOL) 500 MG tablet Take 500 mg by mouth every 6 (six) hours as needed (back pain/ cramps).     [provider]  atenolol (TENORMIN) 25 MG tablet Take 1/2 tablet in the morning and 1/4 at bedtime    [provider]  blood glucose meter kit and supplies Test blood sugar twice daily. Dx code: R73.09 04/25/19   Maryruth Hancock, MD  cholecalciferol (VITAMIN D3) 25 MCG (1000 UNIT) tablet Take 1,000 Units by mouth daily.    [provider]  ELDERBERRY PO Take 1 capsule by mouth daily.    [provider]  famotidine (PEPCID) 20 MG tablet Take 1 tablet (20 mg total) by mouth 2 (two) times daily. 11/20/18   [name], MD  ferrous sulfate 325 (65 FE) MG tablet TAKE 1 TABLET BY MOUTH EVERY OTHER DAY **PLEASE  TAKE  WITH  A  SOURCE  OF  VITAMIN  C** 06/23/21   Dede Query T, PA-C  fluticasone (FLONASE) 50 MCG/ACT nasal spray Place 2 sprays into both nostrils daily. Patient taking differently: Place 2 sprays into both nostrils as needed (seasonal allergies). 10/10/17   Tenna Delaine D, PA-C  glucose blood (ONETOUCH VERIO) test strip TEST BLOOD SUGAR DAILY 09/02/19   Forrest Moron, MD  levalbuterol [facility] HFA) 45 MCG/ACT inhaler Inhale 1-2 puffs every 6 hours if needed- rescue inhaler 09/18/20   Baird Lyons D, MD  levocetirizine (XYZAL) 5 MG tablet levocetirizine 5 mg tablet  TAKE 1 TABLET BY MOUTH AT BEDTIME FOR 2 WEEKS THEN AS NEEDED    [provider]  LORazepam (ATIVAN) 0.5 MG tablet Take 1 tablet (0.5 mg total) by mouth every 6 (six) hours as needed for anxiety. Patient taking differently: Take 0.25 mg by mouth 3 (three) times daily as needed for anxiety. 03/19/19   Forrest Moron, MD  Multiple Vitamins-Minerals (MULTIVITAMIN GUMMIES ADULT PO) Take 1 tablet by mouth daily.    [provider]  nitroGLYCERIN  (NITROSTAT) 0.4 MG SL tablet Place 1 tablet (0.4 mg total) under the tongue every 5 (five) minutes as needed for chest pain. 03/04/21 06/22/21  Minus Breeding, MD  Norethindrone-Ethinyl Estradiol-Fe Biphas (LO LOESTRIN FE) 1 MG-10 MCG / 10 MCG tablet Take 1 tablet by mouth at bedtime.     [provider]  [facility] DELICA LANCETS 63K MISC USE TO TEST BLOOD SUGAR DAILY 11/16/18  [name], MD  pantoprazole (PROTONIX) 40 MG tablet Take 1 tablet (40 mg total) by mouth 2 (two) times daily. 02/23/21   Thornton Park, MD  Peppermint Oil (IBGARD) 90 MG CPCR Use as directed Patient not taking: No sig reported 06/04/20   Thornton Park, MD  sucralfate (CARAFATE) 1 g tablet TAKE 1 TABLET BY MOUTH 4 TIMES DAILY AS NEEDED --MAKE  A  SLURRY 04/09/21   Thornton Park, MD  vitamin B-12 (CYANOCOBALAMIN) 1000 MCG tablet Take 1,000 mcg by mouth daily.     [provider]    Allergies    Penicillins, Tamiflu [oseltamivir phosphate], Tinidazole, and Diltiazem  Review of Systems   Review of Systems  Constitutional:  Negative for chills, diaphoresis and fever.  HENT:  Positive for congestion.   Respiratory: Negative.  Negative for shortness of breath.   Cardiovascular:  Positive for chest pain.  Gastrointestinal: Negative.  Negative for abdominal pain and vomiting.  Musculoskeletal: Negative.   Skin: Negative.   Neurological: Negative.    Physical Exam Updated Vital Signs BP (!) 153/80 (BP Location: Right Arm)   Pulse 68   Temp 98.9 F (37.2 C) (Oral)   Resp 16   Ht _0  (1.626 m)   Wt 77.1 kg   SpO2 100%   BMI 29.18 kg/m   Physical Exam Vitals and nursing note reviewed.  Constitutional:      Appearance: She is well-developed.  HENT:     Head: Normocephalic.  Cardiovascular:     Rate and Rhythm: Normal rate and regular rhythm.     Heart sounds: No murmur heard. Pulmonary:     Effort: Pulmonary effort is normal.     Breath sounds: Normal breath sounds. No wheezing,  rhonchi or rales.  Abdominal:     General: Bowel sounds are normal.     Palpations: Abdomen is soft.     Tenderness: There is no abdominal tenderness. There is no guarding or rebound.  Musculoskeletal:        General: Normal range of motion.     Cervical back: Normal range of motion and neck supple.  Skin:    General: Skin is warm and dry.  Neurological:     General: No focal deficit present.     Mental Status: She is alert and oriented to person, place, and time.    ED Results / Procedures / Treatments   Labs (all labs ordered are listed, but only abnormal results are displayed) Labs Reviewed  BASIC METABOLIC PANEL - Abnormal; Notable for the following components:      Result Value   Potassium 3.4 (*)    Creatinine, Ser 1.13 (*)    All other components within normal limits  RESP PANEL BY RT-PCR (FLU A&B, COVID) [id number]  CBC  I-STAT BETA HCG BLOOD, ED ([facility], [facility], [facility] ONLY)  TROPONIN I (HIGH SENSITIVITY)  TROPONIN I (HIGH SENSITIVITY)   Results for orders placed or performed during the hospital encounter of 07/06/21  Resp Panel by RT-PCR (Flu A&B, Covid) Nasopharyngeal Swab   Specimen: Nasopharyngeal Swab; Nasopharyngeal(NP) swabs in vial transport medium  Result Value Ref Range   SARS Coronavirus 2 by RT PCR NEGATIVE NEGATIVE   Influenza A by PCR NEGATIVE NEGATIVE   Influenza B by PCR NEGATIVE NEGATIVE  Basic metabolic panel  Result Value Ref Range   Sodium 141 135 - 145 mmol/L   Potassium 3.4 (L) 3.5 - 5.1 mmol/L   Chloride 109 98 - 111 mmol/L   CO2  25 22 - 32 mmol/L   Glucose, Bld 92 70 - 99 mg/dL   BUN 18 6 - 20 mg/dL   Creatinine, Ser 1.13 (H) 0.44 - 1.00 mg/dL   Calcium 9.1 8.9 - 10.3 mg/dL   GFR, Estimated >60 >60 mL/min   Anion gap 7 5 - 15  CBC  Result Value Ref Range   WBC 10.5 4.0 - 10.5 K/uL   RBC 4.12 3.87 - 5.11 MIL/uL   Hemoglobin 12.8 12.0 - 15.0 g/dL   HCT 37.5 36.0 - 46.0 %   MCV 91.0 80.0 - 100.0 fL   MCH 31.1 26.0 - 34.0 pg   MCHC 34.1 30.0 -  36.0 g/dL   RDW 12.4 11.5 - 15.5 %   Platelets 275 150 - 400 K/uL   nRBC 0.0 0.0 - 0.2 %  I-Stat beta hCG blood, ED  Result Value Ref Range   I-stat hCG, quantitative <5.0 <5 mIU/mL   Comment 3          Troponin I (High Sensitivity)  Result Value Ref Range   Troponin I (High Sensitivity) <2 <18 ng/L   *Note: Due to a large number of results and/or encounters for the requested time period, some results have not been displayed. A complete set of results can be found in Results Review.     EKG EKG Interpretation  Date/Time:  Tuesday July 06 2021 02:04:52 EDT Ventricular Rate:  72 PR Interval:  171 QRS Duration: 104 QT Interval:  383 QTC Calculation: 420 R Axis:   43 Text Interpretation: Sinus rhythm Consider left atrial enlargement Low voltage, precordial leads Confirmed by Merrily Pew [phone]) on 07/06/2021 5:48:31 AM  Radiology DG Chest 2 View  Result Date: 07/06/2021 CLINICAL DATA:  Chest pain EXAM: CHEST - 2 VIEW COMPARISON:  06/13/2021 FINDINGS: The heart size and mediastinal contours are within normal limits. Both lungs are clear. The visualized skeletal structures are unremarkable. IMPRESSION: No active cardiopulmonary disease. Electronically Signed   By: Ulyses Jarred M.D.   On: 07/06/2021 02:18    Procedures Procedures   Medications Ordered in ED Medications  alum & mag hydroxide-simeth (MAALOX/MYLANTA) 200-200-20 MG/5ML suspension 30 mL (has no administration in time range)    And  lidocaine (XYLOCAINE) 2 % viscous mouth solution 15 mL (has no administration in time range)    ED Course  I have reviewed the triage vital signs and the nursing notes.  Pertinent labs & imaging results that were available during my care of the patient were reviewed by me and considered in my medical decision making (see chart for details).    MDM Rules/Calculators/A&P                           Patient to ED with ss/sxs as per HPI.   She is overall well appearing. Symptoms of  burning type chest pain without aggravating factors. She reports eating tends to lessen her symptoms.   EKG does not show any ischemia. Troponin negative. CXR negative. Do not feel current symptoms represent ACS, dissection, PE. GI cocktail provided. Will reassess.   Pain is some better after GI cocktail. She can be discharged home with f/u with compliance with Carafate and GI f/u recommended.    Final Clinical Impression(s) / ED Diagnoses Final diagnoses:  None   Nonspecific chest pain  Rx / DC Orders ED Discharge Orders     None        Charlann Lange,  PA-C 07/11/21 [id number]    Merrily Pew, MD 07/12/21 2310

## 2021-07-06 NOTE — ED Triage Notes (Signed)
Pt complains of burning in her mid chest since last Wednesday. Pt reports having esophagitis and is currently under treatment. Pt reports having a cough for a couple of days.

## 2021-07-06 NOTE — Discharge Instructions (Addendum)
Take your Carafate as prescribed by your GI doctor. Continue Protonix.   Follow up with your GI doctor for recheck if symptoms persist.

## 2021-07-08 ENCOUNTER — Telehealth: Payer: Self-pay | Admitting: Neurology

## 2021-07-08 NOTE — Telephone Encounter (Signed)
Pt called in to schedule her EEG and mentioned she hit her head in late may or early June. She went to her PCP and they didn't want to do X rays due to her still being a little swollen. She now has been experiencing being off balance randomly and has noticed some separation when she feels her head.

## 2021-07-09 ENCOUNTER — Telehealth: Payer: Self-pay | Admitting: Gastroenterology

## 2021-07-09 NOTE — Telephone Encounter (Signed)
Patient is calling to follow up on previous message requested a call back before 5:00 pm today.

## 2021-07-09 NOTE — Telephone Encounter (Signed)
Inbound call from patient requesting a call from a nurse please.  Was going to set up an ED follow-up appt but ten insisted she needed to talk to the nurse prior.  Please advise.

## 2021-07-13 NOTE — Telephone Encounter (Signed)
Left Message for pt to call back  °

## 2021-07-14 NOTE — Telephone Encounter (Signed)
Pt stated that she has been taking the medication as prescribed. Pt states that she is still having lots of discomfort. Pt is requesting advice on if there is anything else she can try before the follow up appt. Please advise.

## 2021-07-15 ENCOUNTER — Other Ambulatory Visit: Payer: Self-pay | Admitting: Gastroenterology

## 2021-07-15 DIAGNOSIS — K209 Esophagitis, unspecified without bleeding: Secondary | ICD-10-CM

## 2021-07-15 DIAGNOSIS — K219 Gastro-esophageal reflux disease without esophagitis: Secondary | ICD-10-CM

## 2021-07-16 NOTE — Telephone Encounter (Signed)
Pt stated that's she has experienced a Esophagitis flare up. Stated that it was progressively getting worse and went to the ED. ED recommended that pt take carafate and follow up with her GI Dr. Abbott Pao states that she has been taking the carafate but only twice a day due to the timing of when medication is due  whereas it was prescribed for four times she states.  Pt scheduled for a F/U with Alonza Bogus PA on 07/29/2021 @ 1:30. Pt is requesting Dr. Tarri Glenn to review her chart prior to the appt with Alonza Bogus PA. For any advice prior to appt.   Pt was informed that Dr. Tarri Glenn was out of the office today and will be back on Monday.

## 2021-07-18 NOTE — Telephone Encounter (Signed)
I am sorry to hear that she is having trouble. I recommend that she continue her PPI BID, Pepcid 20 mg BID, and Carafate 1g QID taken prior to meals and at bedtime. Office follow-up as planned.  Thanks.  KLB

## 2021-07-19 ENCOUNTER — Telehealth: Payer: Self-pay

## 2021-07-19 NOTE — Telephone Encounter (Signed)
Left message for pt to call back  °

## 2021-07-21 ENCOUNTER — Other Ambulatory Visit: Payer: Self-pay

## 2021-07-21 NOTE — Telephone Encounter (Signed)
Pt stated that she has been taking the medication as prescribed. Pt states that she is still having lots of discomfort. Pt is requesting advice on if there is anything else she can try before the follow up appt. Please advise.  Dr. Tarri Glenn   This Note was sent yesterday at 5:05 PM. It seems as though the note was displaced in the conversation. Please advise.

## 2021-07-21 NOTE — Telephone Encounter (Signed)
Called pt and verified her medications. All medications are current and up to date. Only IBGard and Xyzal were removed as she is not taking these medications.

## 2021-07-22 NOTE — Telephone Encounter (Signed)
Called pt to inform about Dr. Tarri Glenn response below. LVM requesting returned call.

## 2021-07-23 ENCOUNTER — Other Ambulatory Visit: Payer: Self-pay

## 2021-07-23 DIAGNOSIS — K209 Esophagitis, unspecified without bleeding: Secondary | ICD-10-CM

## 2021-07-23 MED ORDER — DEXLANSOPRAZOLE 60 MG PO CPDR
60.0000 mg | DELAYED_RELEASE_CAPSULE | Freq: Every day | ORAL | 11 refills | Status: DC
Start: 2021-07-23 — End: 2021-07-30

## 2021-07-23 NOTE — Telephone Encounter (Signed)
Pt returned call. Informed about Dr. Tarri Glenn recommendations below. Verbalized acceptance and understanding. N/o for Dexilant sent to pt pharmacy of choice.

## 2021-07-23 NOTE — Telephone Encounter (Signed)
SECOND ATTEMPT: ° °LVM requesting returned call. °

## 2021-07-28 ENCOUNTER — Other Ambulatory Visit: Payer: Self-pay | Admitting: Family Medicine

## 2021-07-28 ENCOUNTER — Ambulatory Visit
Admission: RE | Admit: 2021-07-28 | Discharge: 2021-07-28 | Disposition: A | Discharge status: Home / Self Care | Payer: Managed Care, Other (non HMO) | Source: Ambulatory Visit | Attending: Family Medicine | Admitting: Family Medicine

## 2021-07-28 DIAGNOSIS — S0990XD Unspecified injury of head, subsequent encounter: Secondary | ICD-10-CM

## 2021-07-28 DIAGNOSIS — J0111 Acute recurrent frontal sinusitis: Secondary | ICD-10-CM

## 2021-07-29 ENCOUNTER — Telehealth: Payer: Self-pay | Admitting: Gastroenterology

## 2021-07-29 ENCOUNTER — Ambulatory Visit: Payer: Managed Care, Other (non HMO) | Admitting: Gastroenterology

## 2021-07-29 NOTE — Telephone Encounter (Signed)
Per our conversation, pt has been added to Dr. Tarri Glenn schedule on 07/30/21 @ 210pm as work in given her frustration and request to be seen by Dr. Tarri Glenn.

## 2021-07-30 ENCOUNTER — Ambulatory Visit: Payer: Managed Care, Other (non HMO) | Admitting: Cardiology

## 2021-07-30 ENCOUNTER — Telehealth: Payer: Self-pay

## 2021-07-30 ENCOUNTER — Encounter: Payer: Self-pay | Admitting: Gastroenterology

## 2021-07-30 ENCOUNTER — Ambulatory Visit (INDEPENDENT_AMBULATORY_CARE_PROVIDER_SITE_OTHER): Payer: Managed Care, Other (non HMO) | Admitting: Gastroenterology

## 2021-07-30 VITALS — BP 122/70 | HR 67 | Ht 64.0 in | Wt 196.0 lb

## 2021-07-30 DIAGNOSIS — K209 Esophagitis, unspecified without bleeding: Secondary | ICD-10-CM

## 2021-07-30 DIAGNOSIS — K219 Gastro-esophageal reflux disease without esophagitis: Secondary | ICD-10-CM | POA: Diagnosis not present

## 2021-07-30 MED ORDER — SUCRALFATE 1 G PO TABS: ORAL_TABLET | ORAL | 2 refills | Status: DC

## 2021-07-30 MED ORDER — DEXLANSOPRAZOLE 60 MG PO CPDR: 60.0000 mg | DELAYED_RELEASE_CAPSULE | ORAL | 11 refills | Status: DC

## 2021-07-30 MED ORDER — FAMOTIDINE 20 MG PO TABS
20.0000 mg | ORAL_TABLET | Freq: Two times a day (BID) | ORAL | 0 refills | Status: AC
Start: 2021-07-30 — End: ?

## 2021-07-30 NOTE — Progress Notes (Signed)
Referring Provider: [name], MD Primary Care Physician:  [name], MD  Chief complaint:  Burning sensation   IMPRESSION:  Reflux and dysphagia with history of LA Class C reflux esophagitis with breakthrough symptoms on H2blocker BID and PPI BID. History of atypical chest pain thought to be due to reflux.   No prior colon cancer screening. Colonoscopy recommended for colon cancer screening.     PLAN: - Continue famotidine 20 mg BID and Carafate 1g QID PRN - Add Dexilant 60 mg QAM instead of pantoprazole 40 mg BID - Barium esophagram - Proceed with 24 hour pH study and esophageal manometry - Colonoscopy for colon cancer screening at her convenience - Follow-up PRN  Please see the "Patient Instructions" section for addition details about the plan.  HPI: Brooke Turner is a 45 y.o. female who returns in follow-up. Last seen 06/30/20.    01/16/2020 patient seen in clinic by [name], NP for belching, intermittent nausea and burning in her throat with atypical chest pain.  She had had several ED visits for atypical chest pain and cardiac evaluation which was negative (EKG, CT angio chest, Halter monitor).  Apparently chest pain or burning in throat had responded to acid blockers.  Told to continue pantoprazole plus Pepcid and have an EGD.  Patient also wanted an H. pylori test.  It was discussed that she would need to be off of PPI/H2 blockers for several days prior to the study and she did not think she could tolerate that at the time.  01/30/2020 EGD with LA grade C reflux esophagitis with no bleeding.  Pantoprazole increased to 40 mg twice daily x8 weeks in addition to famotidine 20 mg twice daily.  Biopsies showed chronic inactive gastritis as well as GERD. There was no H pylori.   01/31/2020 patient called our clinic and complained of severe pain to her lower esophagus and unable to eat and swallow liquids without pain.  She was sent to the ED.  01/31/2020 ER visit for  abdominal pain and lower chest pain and difficulty swallowing.  CMP, CBC and lipase normal.  Chest x-ray normal.  She was consulted by our service while in the ER.  She had declined GI cocktail.  She had a CT which was negative showing no acute abnormality except for an enlarged myomatous retroverted uterus.  It was discussed that she should have a CCK HIDA scan as an outpatient.  Is recommended she have hyoscyamine every 6- 8 hours.  02/10/20 office visit with [name] when she was feeling better after a trial of Carafate which was "so hard to take around my other medicines".  She was using Pantoprazole 40 mg daily and her Pepcid 20 mg twice daily.    05/27/20 office visit reporting bloating without diarrhea or constipation, nausea, post-prandial diarrhea. TTGA and IgA were negative for celiac. She was ask to start dicyclomine and given samples of IBGard. Treated with 14 days of Xifaxan and recommended to have a colonoscopy.  She did not take the Xifaxan.   06/30/20 Office visit for a burning sensation triggered by eating [place] food although was purposefully avoid spicy and tomato-based foods.  Associated dysphonia. Symptoms are similar to those that she experienced prior to her upper endoscopy despite taking pantoprazole 40 mg twice daily, famotidine 20 mg twice daily, Tums.  She felt a sensation of "burning air coming up "with a raw throat.'  Associated cough. Symptoms were slowly improving before she added Carafate as recommended when  she called the office prior to the appointment.    Seen now with worsening symptoms for 3-4 months despite Carafate, which seemed to work better in the past. If she has felt relief, it doesn't last for more than 2 days. Feels a heaviness or "hot" inside at the sternal notch, brash, and the sensation that food is getting stuck at the sternal notch. Will use Tums PRN. Diet limited to smoothies. Associated weight loss.   She did not start the [place] because the  pharmacist told her that they were waiting to hear back for a generic version.   Found relief with GI cocktail at the hospital.   No prior colon cancer screening.  Mom with GERD and a hiatal hernia. Father had peptic ulcer disease. Father with cancer of the bile duct. No known family history of colon cancer or polyps. No family history of uterine/endometrial cancer, pancreatic cancer or gastric/stomach cancer.  Past Medical History:  Diagnosis Date   Anxiety    Back pain    Chest pain    a. 09/2014 Echo: EF 60-65%, no rwma, nl valves;  b. 12/2014 Neg ETT.   Essential hypertension    a. 01/2016 Renal duplex: no RAS- ? renal cyst (renal u/s 4/17 - no cyst, nl study); b. 02/2016 24 hr BP Monitor: Mean SBP ~ 138 with Max of 198. Highest pressures noted in evening hours following placement of cuff.   Fibroid uterus    GERD (gastroesophageal reflux disease)    Palpitations    a. 09/2014 nl event monitor.    Past Surgical History:  Procedure Laterality Date   ESOPHAGOGASTRODUODENOSCOPY  01/2020   WISDOM TOOTH EXTRACTION      Current Outpatient Medications  Medication Sig Dispense Refill   acetaminophen (TYLENOL) 500 MG tablet Take 500 mg by mouth every 6 (six) hours as needed (back pain/ cramps).      atenolol (TENORMIN) 25 MG tablet Take 1/2 tablet in the morning and 1/4 at bedtime     blood glucose meter kit and supplies Test blood sugar twice daily. Dx code: R73.09 1 each 0   cholecalciferol (VITAMIN D3) 25 MCG (1000 UNIT) tablet Take 1,000 Units by mouth daily.     dexlansoprazole (DEXILANT) 60 MG capsule Take 1 capsule (60 mg total) by mouth daily. 30 capsule 11   ELDERBERRY PO Take 1 capsule by mouth daily.     famotidine (PEPCID) 20 MG tablet Take 1 tablet (20 mg total) by mouth 2 (two) times daily. 180 tablet 0   ferrous sulfate 325 (65 FE) MG tablet TAKE 1 TABLET BY MOUTH EVERY OTHER DAY **PLEASE  TAKE  WITH  A  SOURCE  OF  VITAMIN  C** 45 tablet 0   fluticasone (FLONASE) 50  MCG/ACT nasal spray Place 2 sprays into both nostrils daily. (Patient taking differently: Place 2 sprays into both nostrils as needed (seasonal allergies).) 16 g 0   glucose blood (ONETOUCH VERIO) test strip TEST BLOOD SUGAR DAILY 25 each 10   levalbuterol (XOPENEX HFA) 45 MCG/ACT inhaler Inhale 1-2 puffs every 6 hours if needed- rescue inhaler 15 g 0   LORazepam (ATIVAN) 0.5 MG tablet Take 0.5 tablets (0.25 mg total) by mouth 3 (three) times daily as needed for anxiety.     Multiple Vitamins-Minerals (MULTIVITAMIN GUMMIES ADULT PO) Take 1 tablet by mouth daily.     Norethindrone-Ethinyl Estradiol-Fe Biphas (LO LOESTRIN FE) 1 MG-10 MCG / 10 MCG tablet Take 1 tablet by mouth at bedtime.  ONETOUCH DELICA LANCETS 69I MISC USE TO TEST BLOOD SUGAR DAILY 100 each 3   sucralfate (CARAFATE) 1 g tablet TAKE 1 TABLET BY MOUTH 4 TIMES DAILY AS NEEDED **  MAKE  A  SLURRY  ** 30 tablet 0   vitamin B-12 (CYANOCOBALAMIN) 1000 MCG tablet Take 1,000 mcg by mouth daily.      No current facility-administered medications for this visit.    Allergies as of 07/30/2021 - Review Complete 07/30/2021  Allergen Reaction Noted   Penicillins Hives 01/04/2012   Tamiflu [oseltamivir phosphate] Nausea And Vomiting 11/18/2013   Tinidazole Other (See Comments) 09/14/2015   Diltiazem Other (See Comments) 04/12/2016    Family History  Problem Relation Age of Onset   Seizures Mother    Epilepsy Mother    GER disease Mother    Cancer Father        Liver   Diabetes Father 45   Hypertension Father    Heart disease Father 70       CEA, LE Stenting   Alzheimer's disease Maternal Grandmother    Heart disease Maternal Grandfather    Colon cancer Neg Hx    Breast cancer Neg Hx        Physical Exam: General:   Alert,  well-nourished, pleasant and cooperative in NAD Head:  Normocephalic and atraumatic. Eyes:  Sclera clear, no icterus.   Conjunctiva pink. Abdomen:  Soft, nontender, nondistended, normal bowel  sounds, no rebound or guarding. No hepatosplenomegaly.   Neurologic:  Alert and  oriented x4;  grossly nonfocal Skin:  Intact without significant lesions or rashes. Psych:  Alert and cooperative. Normal mood and affect.    Zaydon Kinser L. Tarri Glenn, MD, MPH 07/30/2021, 2:25 PM

## 2021-07-30 NOTE — Telephone Encounter (Signed)
PRIOR AUTHORIZATION  PA initiation date: 07/30/21  Medication: dexlansoprazole (DEXILANT) 60 MG capsule  Insurance Company: Programme researcher, broadcasting/film/video completed electronically through [employer] My Meds: Yes  APPROVAL  Medication: dexlansoprazole (DEXILANT) 60 MG capsule  Insurance Company: [employer] PA response: APPROVED Approval dates: 06/30/21 through 07/30/22 Misc. Notes: Rebbie FELICE-JENNINGS (Key: [id number])  This request has received a favorable outcome.  Please note any additional information provided by [employer] at the bottom of your screen.  You will also receive a faxed copy of the determination. Angell FELICE-JENNINGS Key: [id number] - PA Case ID: [id number] Need help? Call us at [phone] Outcome Approvedtoday CaseId:[id number];Status:Approved;Review Type:Prior Auth;Coverage Start Date:06/30/2021;Coverage End Date:07/30/2022; Drug Dexlansoprazole 60MG  dr capsules Engineer, technical sales PA Form [phone] NCPDP)

## 2021-07-30 NOTE — Progress Notes (Signed)
Following message sent to Brooke Turner and Brooke Turner:  [facility] Phone: [phone] Fax: [phone]   Imaging Ordered: Barium Esophagram  Diagnosis: Esophagitis  Ordering Provider: Dr. Tarri Glenn  Is a Prior Authorization needed? We are in the process of obtaining it now  Is the patient Diabetic? Yes  Does the patient have Hypertension? Yes  Does the patient have any implanted devices or hardware? No  Date of last BUN/Creat, if needed? N/A  Patient Weight? 196#  Is the patient able to get on the table? Yes  Has the patient been diagnosed with COVID? No  Is the patient waiting on COVID testing results? No  Thank you for your assistance! [facility] Team

## 2021-07-30 NOTE — Patient Instructions (Addendum)
It was my pleasure to provide care to you today. Based on our discussion, I am providing you with my recommendations below:  RECOMMENDATION(S):   PRESCRIPTION MEDICATION(S):   We have sent the following medication(s) to your pharmacy:  Famotidine Dexilant - [place]  NOTE: If your medication(s) requires a PRIOR AUTHORIZATION, we will receive notification from your pharmacy. Once received, the process to submit for approval may take up to 7-10 business days. You will be contacted about any denials we have received from your insurance company as well as alternatives recommended by your provider.  PROCEDURE:  I am recommending that you have a(n) colonoscopy completed. Per your request, my staff did not schedule the procedure(s) today. When you are ready to proceed with scheduling, please contact my office at [phone].   NOTE:   At the time of scheduling your procedure, you will also be scheduled for a pre-visit with my nurse. During this appointment, you will be provided with your prep instructions.  Esophageal Manometry and 24 Hour pH study  LOCATION: [facility]. You will need to go to outpatient registration (1st floor of the hospital) first.  DATE AND TIME: on 09/01/21 at 8:30AM. Please arrive 30 minutes prior to your procedure for registration. Make certain to bring your insurance cards as well as a complete list of medications.  Please remember the following:  1) Do not take any muscle relaxants, xanax (alprazolam) or ativan for 1 day prior to your test as well as the day of the test.  2) Nothing to eat or drink after 12:00 midnight on the night before your test.  3) Hold all diabetic medications/insulin the morning of the test. You may eat and take your medications after the test.  4) For 7 days prior to your test, do not take: Reglan, Tagamet, Zantac, Phenergan, Axid or Pepcid.  5) You MAY use an antacid  such as Rolaids or Tums up to 12 hours prior to your test.  6) Continue taking PEPCID, CARAFTE and DEXILANT    ------------------------------------------------------------------------------------------- ABOUT ESOPHAGEAL MANOMETRY Esophageal manometry (muh-NOM-uh-tree) is a test that gauges how well your esophagus works. Your esophagus is the long, muscular tube that connects your throat to your stomach. Esophageal manometry measures the rhythmic muscle contractions (peristalsis) that occur in your esophagus when you swallow. Esophageal manometry also measures the coordination and force exerted by the muscles of your esophagus.  During esophageal manometry, a thin, flexible tube (catheter) that contains sensors is passed through your nose, down your esophagus and into your stomach. Esophageal manometry can be helpful in diagnosing some mostly uncommon disorders that affect your esophagus.  Why it's done Esophageal manometry is used to evaluate the movement (motility) of food through the esophagus and into the stomach. The test measures how well the circular bands of muscle (sphincters) at the top and bottom of your esophagus open and close, as well as the pressure, strength and pattern of the wave of esophageal muscle contractions that moves food along.  What you can expect Esophageal manometry is an outpatient procedure done without sedation. Most people tolerate it well. You may be asked to change into a hospital gown before the test starts.  During esophageal manometry  While you are sitting up, a member of your health care team sprays your throat with a numbing medication or puts numbing gel in your nose or both.  A catheter is guided through your nose into your esophagus. The catheter may be  sheathed in a water-filled sleeve. It doesn't interfere with your breathing. However, your eyes may water, and you may gag. You may have a slight nosebleed from irritation.  After the catheter is in place, you  may be asked to lie on your back on an exam table, or you may be asked to remain seated.  You then swallow small sips of water. As you do, a computer connected to the catheter records the pressure, strength and pattern of your esophageal muscle contractions.  During the test, you'll be asked to breathe slowly and smoothly, remain as still as possible, and swallow only when you're asked to do so.  A member of your health care team may move the catheter down into your stomach while the catheter continues its measurements.  The catheter then is slowly withdrawn.  This test typically takes 30-45 minutes to complete.  ABOUT 24 HOUR PH PROBE An esophageal pH test measures and records the pH in your esophagus to determine if you have gastroesophageal reflux disease (GERD). The test can also be done to determine the effectiveness of medications or surgical treatment for GERD. What is esophageal reflux? Esophageal reflux is a condition in which stomach acid refluxes or moves back into the esophagus (the "food pipe" leading from the mouth to the stomach). How does the esophageal pH test work? A thin, small tube with an acid sensing device on the tip is gently passed through your nose, down the esophagus ("food tube"), and positioned about 2 inches above the lower esophageal sphincter. The tube is secured to the side of your face with clear tape. The end of the tube exiting from your nose is attached to a portable recorder that is worn on your belt or over your shoulder. The recorder has several buttons on it that you will press to mark certain events. A nurse will review the monitoring instructions with you. Once the test has begun, what do I need to know and do? Activity: Follow your usual daily routine. Do not reduce or change your activities during the monitoring period. Doing so can make the monitoring results less useful.  Note: do not take a tub bath or shower; the equipment can't get wet.  Eating: Eat  your regular meals at the usual times. If you do not eat during the monitoring period, your stomach will not produce acid as usual, and the test results will not be accurate. Eat at least 2 meals a day. Eat foods that tend to increase your symptoms (without making yourself miserable). Avoid snacking. Do not suck on hard candy or lozenges and do not chew gum during the monitoring period.  Lying down: Remain upright throughout the day. Do not lie down until you go to bed (unless napping or lying down during the day is part of your daily routine).  Medications: Continue to follow your doctor's advice regarding medications to avoid during the monitoring period.  Recording symptoms: Press the appropriate button on your recorder when symptoms occur (as discussed with the nurse).  Recording events: Record the time you start and stop eating and drinking (anything other than plain water). Record the time you lie down (even if just resting) and when you get back up. The nurse will explain this.  Unusual symptoms or side effects. If you think you may be experiencing any unusual symptoms or side effects, call your doctor.  You will return the next day to have the tube removed. The information on the recorder will be downloaded to  a computer and the results will be analyzed.  After completion of the study Resume your normal diet and medications. Lozenges or hard candy may help ease any sore throat caused by the tube.   It will take at least 2 weeks to receive the results of this test from your physician. ____________________________________________________  [place]:  You will be contacted by [facility] (Your caller ID will indicate phone # [phone]) within the next business 7-10 business days to schedule your BARIUM ESOPHAGRAM. If you have not heard from them within 7-10 business days, please call [facility] at [phone] to follow up on the status of your  appointment.    FOLLOW UP:  After your procedure, you will receive a call from my office staff regarding my recommendation for follow up.  BMI:  If you are age 55 or younger, your body mass index should be between 19-25. Your Body mass index is 33.64 kg/m. If this is out of the aformentioned range listed, please consider follow up with your Primary Care Provider.   MY CHART:  The [facility] providers would like to encourage you to use [facility] to communicate with providers for non-urgent requests or questions.  Due to long hold times on the telephone, sending your provider a message by [facility] may be a faster and more efficient way to get a response.  Please allow 48 business hours for a response.  Please remember that this is for non-urgent requests.   Thank you for trusting me with your gastrointestinal care!    Thornton Park, MD, MPH

## 2021-08-03 NOTE — Telephone Encounter (Signed)
Returned pt call and advised of the following orders below. Verbalized acceptance and understanding of all information. No further questions voiced. Orders below:  Outpatient Medication Detail   Disp Refills Start End   famotidine (PEPCID) 20 MG tablet 180 tablet 0 07/30/2021    Sig - Route: Take 1 tablet (20 mg total) by mouth 2 (two) times daily. - Oral   Sent to pharmacy as: famotidine (PEPCID) 20 MG tablet   E-Prescribing Status: Receipt confirmed by pharmacy (07/30/2021  2:58 PM EDT)    Outpatient Medication Detail   Disp Refills Start End   dexlansoprazole (DEXILANT) 60 MG capsule 30 capsule 11 07/30/2021    Sig - Route: Take 1 capsule (60 mg total) by mouth every morning. - Oral   Sent to pharmacy as: dexlansoprazole (DEXILANT) 60 MG capsule   E-Prescribing Status: Receipt confirmed by pharmacy (07/30/2021  2:58 PM EDT)    Outpatient Medication Detail   Disp Refills Start End   sucralfate (CARAFATE) 1 g tablet 30 tablet 2 07/30/2021    Sig: Dissolve 1 tablet in a small amount of water to create a slurry. Take po QID prn   Sent to pharmacy as: sucralfate (CARAFATE) 1 g tablet   E-Prescribing Status: Receipt confirmed by pharmacy (07/30/2021  2:58 PM EDT)

## 2021-08-03 NOTE — Progress Notes (Signed)
Appointment Information  Name: Brooke Turner, Brooke Turner MRN: [id number]  Date: 08/16/2021 Status: [facility]  Time: 9:30 AM Length: 30  Visit Type: DG ES SCOUT CH DEL IMG 2X CM [[id number]] Copay: $0.00  Provider: [facility] R/F 1 Department: [facility] RAD  Referring Provider: Thornton Park CSN: [id number]  Notes: PT To ARRIVE @ 915am [facility] Location/ Spk w PT / NPO   Made On: 07/30/2021 4:39 PM By: Valli Glance

## 2021-08-03 NOTE — Telephone Encounter (Signed)
Patient called wanting to go over her recommendations for the Dexilant and Famotidine medications not sure how to take them.

## 2021-08-04 ENCOUNTER — Inpatient Hospital Stay: Payer: Managed Care, Other (non HMO)

## 2021-08-04 ENCOUNTER — Inpatient Hospital Stay: Payer: Managed Care, Other (non HMO) | Admitting: Hematology and Oncology

## 2021-08-05 ENCOUNTER — Ambulatory Visit (INDEPENDENT_AMBULATORY_CARE_PROVIDER_SITE_OTHER): Payer: Managed Care, Other (non HMO) | Admitting: Neurology

## 2021-08-05 ENCOUNTER — Other Ambulatory Visit: Payer: Self-pay

## 2021-08-05 ENCOUNTER — Telehealth: Payer: Self-pay

## 2021-08-05 DIAGNOSIS — R410 Disorientation, unspecified: Secondary | ICD-10-CM

## 2021-08-05 DIAGNOSIS — R42 Dizziness and giddiness: Secondary | ICD-10-CM

## 2021-08-05 DIAGNOSIS — R2689 Other abnormalities of gait and mobility: Secondary | ICD-10-CM

## 2021-08-05 NOTE — Telephone Encounter (Signed)
-----   Message from Cameron Sprang, MD sent at 08/05/2021 12:27 PM EDT ----- Pls let her know the brain wave test was normal. Is she still having the episodes where she wakes up disoriented? If yes, how often? Thanks

## 2021-08-05 NOTE — Telephone Encounter (Signed)
Pt called and informed that EEG was normal she stated that she still wakes up disoriented its random they happen 3 or 4 times a week she has a rapid heart beat, she thinks she had one last night

## 2021-08-05 NOTE — Procedures (Signed)
ELECTROENCEPHALOGRAM REPORT  Date of Study: 08/05/2021  Patient's Name: Brooke Turner MRN: [id number] Date of Birth: Mar 03, 1976  Referring Provider: Dr. [name]  Clinical History: This is a 45 year old woman with episodes of waking up disoriented with her heart pounding.   Medications: TYLENOL 500 MG tablet TENORMIN 25 MG tablet VITAMIN D3 25 MCG (1000 UNIT) tablet ELDERBERRY PO PEPCID 20 MG tablet 65 FE MG tablet FLONASE 50 MCG/ACT nasal spray XOPENEX HFA 45 MCG/ACT inhaler XYZAL 5 MG tablet ATIVAN 0.5 MG tablet ANTIVERT 25 MG tablet MULTIVITAMIN GUMMIES ADULT PO LO LOESTRIN FE 1 MG-10 MCG / 10 MCG tablet PROTONIX 40 MG tablet IBGARD 90 MG CPCR CARAFATE 1 g tablet LYSTEDA 650 MG TABS tablet CYANOCOBALAMIN 1000 MCG tablet  Technical Summary: A multichannel digital EEG recording measured by the international 10-20 system with electrodes applied with paste and impedances below 5000 ohms performed in our laboratory with EKG monitoring in an awake and drowsy patient.  Photic stimulation was performed.  The digital EEG was referentially recorded, reformatted, and digitally filtered in a variety of bipolar and referential montages for optimal display.    Description: The patient is awake and drowsy during the recording.  During maximal wakefulness, there is a symmetric, medium voltage 10-11 Hz posterior dominant rhythm that attenuates with eye opening.  The record is symmetric.  During drowsiness, there is an increase in theta slowing of the background.  Sleep was not captured. Photic stimulation did not elicit any abnormalities.  There were no epileptiform discharges or electrographic seizures seen.    EKG lead was unremarkable.  Impression: This awake and drowsy EEG is normal.    Clinical Correlation: A normal EEG does not exclude a clinical diagnosis of epilepsy.  If further clinical questions remain, prolonged EEG may be helpful.  Clinical correlation is  advised.   [name], M.D.

## 2021-08-06 ENCOUNTER — Telehealth: Payer: Self-pay | Admitting: *Deleted

## 2021-08-06 NOTE — Telephone Encounter (Addendum)
LMOM to call back to schedule 72 hour ambulatory EEG. At the time of this call the first available appt is Friday October 21st at [place] with return date for removal of apparatus Monday October 24th at 1:30pm or Monday Oct 24th to Thursday October 27th.  Call [phone] with your calendar handy so we can find a date that suits you.

## 2021-08-06 NOTE — Telephone Encounter (Signed)
Spoke with pt Brooke Turner would like for her to proceed with the 3-day EEG so we can try and capture these episodes and see what the brain waves show when they occur. Pt stated she would like to do the 72 hour eeg

## 2021-08-06 NOTE — Telephone Encounter (Signed)
In that case, I would proceed with the 3-day EEG so we can try and capture these episodes and see what the brain waves show when they occur. Pls have her coordinate with Manuela Schwartz, I will send message to Manuela Schwartz. Thanks

## 2021-08-16 ENCOUNTER — Ambulatory Visit (HOSPITAL_COMMUNITY): Payer: Managed Care, Other (non HMO)

## 2021-08-18 ENCOUNTER — Ambulatory Visit (HOSPITAL_COMMUNITY)
Admission: RE | Admit: 2021-08-18 | Discharge: 2021-08-18 | Disposition: A | Discharge status: Home / Self Care | Payer: Managed Care, Other (non HMO) | Source: Ambulatory Visit | Attending: Gastroenterology | Admitting: Gastroenterology

## 2021-08-18 ENCOUNTER — Other Ambulatory Visit: Payer: Self-pay

## 2021-08-18 DIAGNOSIS — K209 Esophagitis, unspecified without bleeding: Secondary | ICD-10-CM | POA: Diagnosis present

## 2021-08-18 DIAGNOSIS — K219 Gastro-esophageal reflux disease without esophagitis: Secondary | ICD-10-CM | POA: Diagnosis present

## 2021-08-23 ENCOUNTER — Encounter (HOSPITAL_COMMUNITY): Payer: Self-pay | Admitting: Gastroenterology

## 2021-08-27 ENCOUNTER — Telehealth: Payer: Self-pay | Admitting: *Deleted

## 2021-08-27 NOTE — Telephone Encounter (Signed)
LMOM to please call [phone] and ask to speak to Manuela Schwartz so we can set up the 72 hour ambulatory EEG Dr. Delice Lesch has requested for you. We do these Monday through Thursday typicaly and there are the few exceptions when we do these Tuesday to Friday or Friday to Monday.

## 2021-09-01 ENCOUNTER — Encounter (HOSPITAL_COMMUNITY): Payer: Self-pay | Admitting: Gastroenterology

## 2021-09-01 ENCOUNTER — Ambulatory Visit (HOSPITAL_COMMUNITY)
Admission: RE | Admit: 2021-09-01 | Discharge: 2021-09-01 | Disposition: A | Discharge status: Home / Self Care | Payer: Managed Care, Other (non HMO) | Attending: Gastroenterology | Admitting: Gastroenterology

## 2021-09-01 ENCOUNTER — Encounter (HOSPITAL_COMMUNITY)
Admission: RE | Disposition: A | Discharge status: Home / Self Care | Payer: Self-pay | Source: Home / Self Care | Attending: Gastroenterology

## 2021-09-01 DIAGNOSIS — R0789 Other chest pain: Secondary | ICD-10-CM | POA: Diagnosis not present

## 2021-09-01 DIAGNOSIS — K2289 Other specified disease of esophagus: Secondary | ICD-10-CM | POA: Insufficient documentation

## 2021-09-01 DIAGNOSIS — R12 Heartburn: Secondary | ICD-10-CM | POA: Insufficient documentation

## 2021-09-01 DIAGNOSIS — Z8379 Family history of other diseases of the digestive system: Secondary | ICD-10-CM | POA: Diagnosis not present

## 2021-09-01 DIAGNOSIS — K219 Gastro-esophageal reflux disease without esophagitis: Secondary | ICD-10-CM

## 2021-09-01 DIAGNOSIS — R070 Pain in throat: Secondary | ICD-10-CM | POA: Diagnosis present

## 2021-09-01 DIAGNOSIS — R1310 Dysphagia, unspecified: Secondary | ICD-10-CM | POA: Insufficient documentation

## 2021-09-01 DIAGNOSIS — Z800 Family history of malignant neoplasm of digestive organs: Secondary | ICD-10-CM | POA: Diagnosis not present

## 2021-09-01 HISTORY — PX: ESOPHAGEAL MANOMETRY: SHX5429

## 2021-09-01 HISTORY — PX: 24 HOUR PH STUDY: SHX5419

## 2021-09-01 SURGERY — MANOMETRY, ESOPHAGUS
Anesthesia: Monitor Anesthesia Care

## 2021-09-01 MED ORDER — LIDOCAINE VISCOUS HCL 2 % MT SOLN
OROMUCOSAL | Status: AC
  Filled 2021-09-01: qty 15

## 2021-09-01 SURGICAL SUPPLY — 2 items
FACESHIELD LNG OPTICON STERILE (SAFETY) IMPLANT
GLOVE BIO SURGEON STRL SZ8 (GLOVE) ×4 IMPLANT

## 2021-09-01 NOTE — Progress Notes (Addendum)
Esophageal manometry performed per protocol without complications.  Patient tolerated well.  pH probe placed per protocol without complications.  Education given on pH probe, monitor, and diary.  Patient to return to endoscopy tomorrow at  0945 to have probe removed.  Patient verbalized understanding.

## 2021-09-02 ENCOUNTER — Ambulatory Visit: Payer: Managed Care, Other (non HMO) | Admitting: Gastroenterology

## 2021-09-05 NOTE — H&P (Signed)
Referring Provider: No ref. provider found Primary Care Physician:  [name], MD  Chief complaint:  Burning sensation   IMPRESSION:  Reflux and dysphagia with history of LA Class C reflux esophagitis with breakthrough symptoms on H2blocker BID and PPI BID. History of atypical chest pain thought to be due to reflux.      PLAN: - Proceed with 24 hour pH study and esophageal manometry  Please see the "Patient Instructions" section for addition details about the plan.  HPI: Brooke Turner is a 45 y.o. female who presents for further evaluation of brash.  01/16/2020 patient seen in clinic by [name], NP for belching, intermittent nausea and burning in her throat with atypical chest pain.  She had had several ED visits for atypical chest pain and cardiac evaluation which was negative (EKG, CT angio chest, Halter monitor).  Apparently chest pain or burning in throat had responded to acid blockers.  Told to continue pantoprazole plus Pepcid and have an EGD.  Patient also wanted an H. pylori test.  It was discussed that she would need to be off of PPI/H2 blockers for several days prior to the study and she did not think she could tolerate that at the time.  01/30/2020 EGD with LA grade C reflux esophagitis with no bleeding.  Pantoprazole increased to 40 mg twice daily x8 weeks in addition to famotidine 20 mg twice daily.  Biopsies showed chronic inactive gastritis as well as GERD. There was no H pylori.   01/31/2020 patient called our clinic and complained of severe pain to her lower esophagus and unable to eat and swallow liquids without pain.  She was sent to the ED.  01/31/2020 ER visit for abdominal pain and lower chest pain and difficulty swallowing.  CMP, CBC and lipase normal.  Chest x-ray normal.  She was consulted by our service while in the ER.  She had declined GI cocktail.  She had a CT which was negative showing no acute abnormality except for an enlarged myomatous  retroverted uterus.  It was discussed that she should have a CCK HIDA scan as an outpatient.  Is recommended she have hyoscyamine every 6- 8 hours.  02/10/20 office visit with [name] when she was feeling better after a trial of Carafate which was "so hard to take around my other medicines".  She was using Pantoprazole 40 mg daily and her Pepcid 20 mg twice daily.    05/27/20 office visit reporting bloating without diarrhea or constipation, nausea, post-prandial diarrhea. TTGA and IgA were negative for celiac. She was ask to start dicyclomine and given samples of IBGard. Treated with 14 days of Xifaxan and recommended to have a colonoscopy.  She did not take the Xifaxan.   06/30/20 Office visit for a burning sensation triggered by eating [place] food although was purposefully avoid spicy and tomato-based foods.  Associated dysphonia. Symptoms are similar to those that she experienced prior to her upper endoscopy despite taking pantoprazole 40 mg twice daily, famotidine 20 mg twice daily, Tums.  She felt a sensation of "burning air coming up "with a raw throat.'  Associated cough. Symptoms were slowly improving before she added Carafate as recommended when she called the office prior to the appointment.    Seen now with worsening symptoms for 3-4 months despite Carafate, which seemed to work better in the past. If she has felt relief, it doesn't last for more than 2 days. Feels a heaviness or "hot" inside at the sternal notch,  brash, and the sensation that food is getting stuck at the sternal notch. Will use Tums PRN. Diet limited to smoothies. Associated weight loss.   She did not start the [place] because the pharmacist told her that they were waiting to hear back for a generic version.   Found relief with GI cocktail at the hospital.   No prior colon cancer screening.  Mom with GERD and a hiatal hernia. Father had peptic ulcer disease. Father with cancer of the bile duct. No known family  history of colon cancer or polyps. No family history of uterine/endometrial cancer, pancreatic cancer or gastric/stomach cancer.  Past Medical History:  Diagnosis Date   Anxiety    Back pain    Chest pain    a. 09/2014 Echo: EF 60-65%, no rwma, nl valves;  b. 12/2014 Neg ETT.   Essential hypertension    a. 01/2016 Renal duplex: no RAS- ? renal cyst (renal u/s 4/17 - no cyst, nl study); b. 02/2016 24 hr BP Monitor: Mean SBP ~ 138 with Max of 198. Highest pressures noted in evening hours following placement of cuff.   Fibroid uterus    GERD (gastroesophageal reflux disease)    Palpitations    a. 09/2014 nl event monitor.    Past Surgical History:  Procedure Laterality Date   38 HOUR [place] STUDY N/A 09/01/2021   Procedure: [place] STUDY;  Surgeon: Thornton Park, MD;  Location: [facility];  Service: Gastroenterology;  Laterality: N/A;   ESOPHAGEAL MANOMETRY N/A 09/01/2021   Procedure: ESOPHAGEAL MANOMETRY (EM);  Surgeon: Thornton Park, MD;  Location: [facility];  Service: Gastroenterology;  Laterality: N/A;   ESOPHAGOGASTRODUODENOSCOPY  01/2020   WISDOM TOOTH EXTRACTION      No current facility-administered medications for this encounter.   Current Outpatient Medications  Medication Sig Dispense Refill   acetaminophen (TYLENOL) 500 MG tablet Take 500 mg by mouth every 6 (six) hours as needed (back pain/ cramps).      atenolol (TENORMIN) 25 MG tablet Take 1/2 tablet in the morning and 1/4 at bedtime     blood glucose meter kit and supplies Test blood sugar twice daily. Dx code: R73.09 1 each 0   cholecalciferol (VITAMIN D3) 25 MCG (1000 UNIT) tablet Take 1,000 Units by mouth daily.     dexlansoprazole (DEXILANT) 60 MG capsule Take 1 capsule (60 mg total) by mouth every morning. 30 capsule 11   ELDERBERRY PO Take 1 capsule by mouth daily.     famotidine (PEPCID) 20 MG tablet Take 1 tablet (20 mg total) by mouth 2 (two) times daily. 180 tablet 0   ferrous sulfate 325 (65 FE) MG  tablet TAKE 1 TABLET BY MOUTH EVERY OTHER DAY **PLEASE  TAKE  WITH  A  SOURCE  OF  VITAMIN  C** 45 tablet 0   fluticasone (FLONASE) 50 MCG/ACT nasal spray Place 2 sprays into both nostrils daily. (Patient taking differently: Place 2 sprays into both nostrils as needed (seasonal allergies).) 16 g 0   glucose blood (ONETOUCH VERIO) test strip TEST BLOOD SUGAR DAILY 25 each 10   levalbuterol (XOPENEX HFA) 45 MCG/ACT inhaler Inhale 1-2 puffs every 6 hours if needed- rescue inhaler 15 g 0   LORazepam (ATIVAN) 0.5 MG tablet Take 0.5 tablets (0.25 mg total) by mouth 3 (three) times daily as needed for anxiety.     Multiple Vitamins-Minerals (MULTIVITAMIN GUMMIES ADULT PO) Take 1 tablet by mouth daily.     Norethindrone-Ethinyl Estradiol-Fe Biphas (LO LOESTRIN FE) 1  MG-10 MCG / 10 MCG tablet Take 1 tablet by mouth at bedtime.      ONETOUCH DELICA LANCETS 99V MISC USE TO TEST BLOOD SUGAR DAILY 100 each 3   sucralfate (CARAFATE) 1 g tablet Dissolve 1 tablet in a small amount of water to create a slurry. Take po QID prn 30 tablet 2   vitamin B-12 (CYANOCOBALAMIN) 1000 MCG tablet Take 1,000 mcg by mouth daily.       Allergies as of 07/30/2021 - Review Complete 07/30/2021  Allergen Reaction Noted   Penicillins Hives 01/04/2012   Tamiflu [oseltamivir phosphate] Nausea And Vomiting 11/18/2013   Tinidazole Other (See Comments) 09/14/2015   Diltiazem Other (See Comments) 04/12/2016    Family History  Problem Relation Age of Onset   Seizures Mother    Epilepsy Mother    GER disease Mother    Cancer Father        Liver   Diabetes Father 49   Hypertension Father    Heart disease Father 76       CEA, LE Stenting   Alzheimer's disease Maternal Grandmother    Heart disease Maternal Grandfather    Colon cancer Neg Hx    Breast cancer Neg Hx      Physical Exam: General:   Alert,  well-nourished, pleasant and cooperative in NAD Head:  Normocephalic and atraumatic. Eyes:  Sclera clear, no icterus.    Conjunctiva pink. Abdomen:  Soft, nontender, nondistended, normal bowel sounds, no rebound or guarding. No hepatosplenomegaly.   Neurologic:  Alert and  oriented x4;  grossly nonfocal Skin:  Intact without significant lesions or rashes. Psych:  Alert and cooperative. Normal mood and affect.    Ansleigh Safer L. Tarri Glenn, MD, MPH 09/05/2021, 10:17 PM

## 2021-09-06 ENCOUNTER — Telehealth: Payer: Self-pay | Admitting: Gastroenterology

## 2021-09-06 NOTE — Telephone Encounter (Signed)
Returned pt call. States she has been researching Dexilant and has read that this medication should not be considered if pregnant or planning to become pregnant. Asked if she is currently considering pregnancy. States she is considering to begin trying in the next 2-3 months. Pt has a follow up appt scheduled with Dr. Tarri Glenn on 09/10/21. Advised she discuss this further at the time of her visit. Verbalized acceptance and understanding.

## 2021-09-06 NOTE — Telephone Encounter (Signed)
Patient called requesting to speak with a nurse regarding the Dexilant medication asked for a call back.

## 2021-09-10 ENCOUNTER — Telehealth: Payer: Self-pay | Admitting: *Deleted

## 2021-09-10 ENCOUNTER — Other Ambulatory Visit: Payer: Self-pay | Admitting: Gastroenterology

## 2021-09-10 ENCOUNTER — Ambulatory Visit (INDEPENDENT_AMBULATORY_CARE_PROVIDER_SITE_OTHER): Payer: Managed Care, Other (non HMO) | Admitting: Gastroenterology

## 2021-09-10 ENCOUNTER — Encounter: Payer: Self-pay | Admitting: Gastroenterology

## 2021-09-10 ENCOUNTER — Other Ambulatory Visit: Payer: Self-pay | Admitting: *Deleted

## 2021-09-10 VITALS — BP 122/70 | HR 59 | Ht 64.0 in | Wt 197.5 lb

## 2021-09-10 DIAGNOSIS — K219 Gastro-esophageal reflux disease without esophagitis: Secondary | ICD-10-CM | POA: Diagnosis not present

## 2021-09-10 DIAGNOSIS — K224 Dyskinesia of esophagus: Secondary | ICD-10-CM

## 2021-09-10 MED ORDER — FERROUS SULFATE 325 (65 FE) MG PO TABS: 325.0000 mg | ORAL_TABLET | Freq: Every day | ORAL | 1 refills | Status: DC

## 2021-09-10 NOTE — Progress Notes (Signed)
Referring Provider: [name], MD Primary Care Physician:  [name], MD  Chief complaint:  Burning sensation   IMPRESSION:  Reflux and dysphagia with history of LA Class C reflux esophagitis with breakthrough symptoms on H2blocker BID and PPI BID.  24-hour pH probe showed adequate acid suppression but ongoing episodes of nonacid reflux. She does not have a significant hiatal hernia. Some clinical improvement with the addition of Carafate.   Ineffective esophageal motility seen on recent motility study: Findings highly suggestive of mixed connective tissue disease or scleroderma.  History of atypical chest pain thought to be due to reflux.   Need for colon cancer screening. Discussed recommendations today. She will schedule when she is ready.    PLAN: - Continue pantoprazole BID, famotidine 20 mg BID and Carafate 1g QID PRN - She is considering a short term trial of Dexilant but is concerned about risks - Referral to rheumatology to evaluate for mixed connective tissue disease or scleroderma - [facility] - Colonoscopy for colon cancer screening at her convenience - Follow-up PRN  Please see the "Patient Instructions" section for addition details about the plan.  HPI: Bayleigh Loflin is a 45 y.o. female who returns in follow-up. Last seen 07/30/21.    01/16/2020 patient seen in clinic by [name], NP for belching, intermittent nausea and burning in her throat with atypical chest pain.  She had had several ED visits for atypical chest pain and cardiac evaluation which was negative (EKG, CT angio chest, Halter monitor).  Apparently chest pain or burning in throat had responded to acid blockers.  Told to continue pantoprazole plus Pepcid and have an EGD.  Patient also wanted an H. pylori test.  It was discussed that she would need to be off of PPI/H2 blockers for several days prior to the study and she did not think she could tolerate  that at the time.  01/30/2020 EGD with LA grade C reflux esophagitis with no bleeding.  Pantoprazole increased to 40 mg twice daily x8 weeks in addition to famotidine 20 mg twice daily.  Biopsies showed chronic inactive gastritis as well as GERD. There was no H pylori.   01/31/2020 patient called our clinic and complained of severe pain to her lower esophagus and unable to eat and swallow liquids without pain.  She was sent to the ED.  01/31/2020 ER visit for abdominal pain and lower chest pain and difficulty swallowing.  CMP, CBC and lipase normal.  Chest x-ray normal.  She was consulted by our service while in the ER.  She had declined GI cocktail.  She had a CT which was negative showing no acute abnormality except for an enlarged myomatous retroverted uterus.  It was discussed that she should have a CCK HIDA scan as an outpatient.  Is recommended she have hyoscyamine every 6- 8 hours.  02/10/20 office visit with [name] when she was feeling better after a trial of Carafate which was "so hard to take around my other medicines".  She was using Pantoprazole 40 mg daily and her Pepcid 20 mg twice daily.    05/27/20 office visit reporting bloating without diarrhea or constipation, nausea, post-prandial diarrhea. TTGA and IgA were negative for celiac. She was ask to start dicyclomine and given samples of IBGard. Treated with 14 days of Xifaxan and recommended to have a colonoscopy.  She did not take the Xifaxan.   06/30/20 Office visit for a burning sensation triggered by eating  [place] food although was purposefully avoid spicy and tomato-based foods.  Associated dysphonia. Symptoms are similar to those that she experienced prior to her upper endoscopy despite taking pantoprazole 40 mg twice daily, famotidine 20 mg twice daily, Tums.  She felt a sensation of "burning air coming up "with a raw throat.'  Associated cough. Symptoms were slowly improving before she added Carafate as recommended when she  called the office prior to the appointment.    07/30/21: Worsening symptoms for 3-4 months despite Carafate, which seemed to work better in the past. If she has felt relief, it doesn't last for more than 2 days. Feels a heaviness or "hot" inside at the sternal notch, brash, and the sensation that food is getting stuck at the sternal notch. Will use Tums PRN. Diet limited to smoothies. Associated weight loss.  She did not start the [place] because the pharmacist told her that they were waiting to hear back for a generic version.  Found relief with GI cocktail at the hospital.   08/18/21: Normal esophagram  09/01/21: High resolution esophageal motility showed low resting pressure of the lower esophageal sphincter at the GE junction, ineffective esophageal motility.  A concurrent 24-hour pH probe showed good acid suppression on H2 blocker with evidence for gastroesophageal nonacid reflux.  09/10/21: She did not start the [place] because the package insert scared her about malignancy and concerned about risks if she were to become pregnant. Symptoms are better on famotidine 20 BID and Carafate 1g QID. She is confused by the results of her manometry. No new complaints or concerns. Still not eager to pursue colon cancer screening.    Past Medical History:  Diagnosis Date   Anxiety    Back pain    Chest pain    a. 09/2014 Echo: EF 60-65%, no rwma, nl valves;  b. 12/2014 Neg ETT.   Essential hypertension    a. 01/2016 Renal duplex: no RAS- ? renal cyst (renal u/s 4/17 - no cyst, nl study); b. 02/2016 24 hr BP Monitor: Mean SBP ~ 138 with Max of 198. Highest pressures noted in evening hours following placement of cuff.   Fibroid uterus    GERD (gastroesophageal reflux disease)    Hiatal hernia    Palpitations    a. 09/2014 nl event monitor.    Past Surgical History:  Procedure Laterality Date   63 HOUR [place] STUDY N/A 09/01/2021   Procedure: [place] STUDY;  Surgeon: Thornton Park, MD;  Location:  [facility];  Service: Gastroenterology;  Laterality: N/A;   ESOPHAGEAL MANOMETRY N/A 09/01/2021   Procedure: ESOPHAGEAL MANOMETRY (EM);  Surgeon: Thornton Park, MD;  [facility]: [facility];  Service: Gastroenterology;  Laterality: N/A;   ESOPHAGOGASTRODUODENOSCOPY  01/2020   WISDOM TOOTH EXTRACTION      Current Outpatient Medications  Medication Sig Dispense Refill   acetaminophen (TYLENOL) 500 MG tablet Take 500 mg by mouth every 6 (six) hours as needed (back pain/ cramps).      atenolol (TENORMIN) 25 MG tablet Take 1/2 tablet in the morning and 1/4 at bedtime     blood glucose meter kit and supplies Test blood sugar twice daily. Dx code: R73.09 1 each 0   cholecalciferol (VITAMIN D3) 25 MCG (1000 UNIT) tablet Take 1,000 Units by mouth daily.     ELDERBERRY PO Take 1 capsule by mouth daily.     famotidine (PEPCID) 20 MG tablet Take 1 tablet (20 mg total) by mouth 2 (two) times daily. 180 tablet 0  fluticasone (FLONASE) 50 MCG/ACT nasal spray Place 2 sprays into both nostrils daily. (Patient taking differently: Place 2 sprays into both nostrils as needed (seasonal allergies).) 16 g 0   glucose blood (ONETOUCH VERIO) test strip TEST BLOOD SUGAR DAILY 25 each 10   levalbuterol (XOPENEX HFA) 45 MCG/ACT inhaler Inhale 1-2 puffs every 6 hours if needed- rescue inhaler 15 g 0   LORazepam (ATIVAN) 0.5 MG tablet Take 0.5 tablets (0.25 mg total) by mouth 3 (three) times daily as needed for anxiety.     Multiple Vitamins-Minerals (MULTIVITAMIN GUMMIES ADULT PO) Take 1 tablet by mouth daily.     Norethindrone-Ethinyl Estradiol-Fe Biphas (LO LOESTRIN FE) 1 MG-10 MCG / 10 MCG tablet Take 1 tablet by mouth at bedtime.      ONETOUCH DELICA LANCETS 08L MISC USE TO TEST BLOOD SUGAR DAILY 100 each 3   sucralfate (CARAFATE) 1 g tablet Dissolve 1 tablet in a small amount of water to create a slurry. Take po QID prn 30 tablet 2   vitamin B-12 (CYANOCOBALAMIN) 1000 MCG tablet Take 1,000 mcg by mouth daily.       dexlansoprazole (DEXILANT) 60 MG capsule Take 1 capsule (60 mg total) by mouth every morning. (Patient not taking: Reported on 09/10/2021) 30 capsule 11   ferrous sulfate 325 (65 FE) MG tablet Take 1 tablet (325 mg total) by mouth daily with breakfast. 90 tablet 1   pantoprazole (PROTONIX) 40 MG tablet Take 1 tablet by mouth twice daily 60 tablet 0   No current facility-administered medications for this visit.    Allergies as of 09/10/2021 - Review Complete 09/10/2021  Allergen Reaction Noted   Penicillins Hives 01/04/2012   Tamiflu [oseltamivir phosphate] Nausea And Vomiting 11/18/2013   Tinidazole Other (See Comments) 09/14/2015   Diltiazem Other (See Comments) 04/12/2016    Family History  Problem Relation Age of Onset   Seizures Mother    Epilepsy Mother    GER disease Mother    Cancer Father        Liver   Diabetes Father 23   Hypertension Father    Heart disease Father 26       CEA, LE Stenting   Alzheimer's disease Maternal Grandmother    Heart disease Maternal Grandfather    Colon cancer Neg Hx    Breast cancer Neg Hx      Physical Exam: General:   Alert,  well-nourished, pleasant and cooperative in NAD Head:  Normocephalic and atraumatic. Eyes:  Sclera clear, no icterus.   Conjunctiva pink. Abdomen:  Soft, nontender, nondistended, normal bowel sounds, no rebound or guarding. No hepatosplenomegaly.   Neurologic:  Alert and  oriented x4;  grossly nonfocal Skin:  Intact without significant lesions or rashes. Psych:  Alert and cooperative. Normal mood and affect.    Rovena Hearld L. Tarri Glenn, MD, MPH 09/12/2021, 5:06 PM

## 2021-09-10 NOTE — Patient Instructions (Addendum)
It was my pleasure to provide care to you today. Based on our discussion, I am providing you with my recommendations below:  RECOMMENDATION(S):   Continue Famotidine, Pantoprazole and Carafate as prescribed Per our discussion, you are going to consider a short term trial of Dexilant at this time Colonoscopy for colon cancer screening at your convenience Referral to rheumatology to evaluate for mixed connective tissue disease or scleroderma Referral to [facility] Esophageal clinic  REFERRAL:  A referral, your demographics, a copy of your insurance card and your records will be sent to [facility]. You will receive a call from their office regarding the date, time and location of your appointment.   FOLLOW UP:  I would like for you to follow up with me as needed. Please call the office at [phone] to schedule your appointment.  BMI:  If you are age 29 or younger, your body mass index should be between 19-25. Your Body mass index is 33.9 kg/m. If this is out of the aformentioned range listed, please consider follow up with your Primary Care Provider.   MY CHART:  The [facility] providers would like to encourage you to use [facility] to communicate with providers for non-urgent requests or questions.  Due to long hold times on the telephone, sending your provider a message by [facility] may be a faster and more efficient way to get a response.  Please allow 48 business hours for a response.  Please remember that this is for non-urgent requests.   Thank you for trusting me with your gastrointestinal care!    Thornton Park, MD, MPH

## 2021-09-10 NOTE — Telephone Encounter (Signed)
Patient had called and I was returning her call to set up 72 hour ambulatory EEG.

## 2021-09-12 ENCOUNTER — Encounter: Payer: Self-pay | Admitting: Gastroenterology

## 2021-09-13 ENCOUNTER — Telehealth: Payer: Self-pay

## 2021-09-13 ENCOUNTER — Other Ambulatory Visit: Payer: Self-pay

## 2021-09-13 DIAGNOSIS — K224 Dyskinesia of esophagus: Secondary | ICD-10-CM

## 2021-09-13 NOTE — Telephone Encounter (Signed)
Referrals sent to [facility] [phone] and [facility] [phone]:  Chart Review Routing History Since 09/14/2020 (View Full Routing History)  Recipients Sent On Sent By Routed Reports   [facility]   09/13/2021 10:38 AM Kathren Scearce Hardie Pulley, LPN [facility] AMB CHART REVIEW DEMOGRAPHICS        Chart Review Routing History Since 09/14/2020 (View Full Routing History)  Recipients Sent On Sent By Routed Reports   [facility]   09/13/2021 10:34 AM Aleatha Borer, LPN SURGICAL PATHOLOGY ([id number])       [facility]   09/13/2021 10:31 AM Aleatha Borer, LPN TISSUE TRANSGLUTAMINASE, IGA ([id number])       [facility] Rheumatology   [facility] GI - Esophageal Specialist   09/13/2021 10:29 AM Aleatha Borer, LPN DG ESOPHAGUS W DOUBLE CM (HD) ([id number])       [facility] [facility] - Esophageal Specialist   09/13/2021 10:28 AM Aleatha Borer, LPN UPPER ENDOSCOPY ([id number])       [facility] GI - Esophageal Specialist   [facility] Rheumatology   09/13/2021 10:26 AM Aleatha Borer, LPN Office Visit on 50/03/3975 with Thornton Park, MD       [facility] Rheumatology   09/13/2021 10:25 AM Aleatha Borer, LPN Consultation - Auth/Cert # [id number] (New Request)      Cover Page Message : Elizabeth Sauer and tx't for mixed connective tissue disease or scleroderma       [facility] GI - Esophageal Specialist   09/13/2021 10:24 AM Aleatha Borer, LPN Consultation - Auth/Cert # [id number] (New Request)      Cover Page Message : Elizabeth Sauer and tx't for esophageal dysmotility

## 2021-09-16 NOTE — Telephone Encounter (Signed)
LMOM to acknowledge she had returned my call but I was with a patient and was unable to answer the phone. Let her know I will be here today 1/2 day and all day tomorrow so if she calls ask the front desk to hunt me down.

## 2021-09-21 ENCOUNTER — Other Ambulatory Visit: Payer: Self-pay

## 2021-09-21 ENCOUNTER — Telehealth: Payer: Self-pay | Admitting: Gastroenterology

## 2021-09-21 DIAGNOSIS — K219 Gastro-esophageal reflux disease without esophagitis: Secondary | ICD-10-CM

## 2021-09-21 DIAGNOSIS — K209 Esophagitis, unspecified without bleeding: Secondary | ICD-10-CM

## 2021-09-21 MED ORDER — SUCRALFATE 1 G PO TABS: ORAL_TABLET | ORAL | 3 refills | Status: AC

## 2021-09-21 NOTE — Telephone Encounter (Signed)
Outpatient Medication Detail   Disp Refills Start End   sucralfate (CARAFATE) 1 g tablet 120 tablet 3 09/21/2021    Sig: Dissolve 1 tablet in a small amount of water to create a slurry and take po QID prn   Sent to pharmacy as: sucralfate (CARAFATE) 1 g tablet   E-Prescribing Status: Receipt confirmed by pharmacy (09/21/2021  2:48 PM EST)

## 2021-09-21 NOTE — Telephone Encounter (Signed)
Please advise if requested increase in dosage and frequency is appropriate for refill.

## 2021-09-21 NOTE — Telephone Encounter (Signed)
Patient called requesting a new prescription of Carafate, asking that the quantity be higher than 30, as the doctor wanted her to stay on it and sometimes she takes up to 4 a day.  Please call patient and let her know.  Thank you.

## 2021-09-28 ENCOUNTER — Ambulatory Visit: Payer: Managed Care, Other (non HMO) | Admitting: Neurology

## 2021-10-05 ENCOUNTER — Other Ambulatory Visit: Payer: Managed Care, Other (non HMO)

## 2021-10-06 NOTE — Telephone Encounter (Signed)
Inbound call from patient. States [facility] could not get her in till March and would like to know if someone can get her seen sooner

## 2021-10-07 NOTE — Telephone Encounter (Signed)
Called [facility] [phone] and [facility] [phone] to determine if we could expedite pt appt. States they have placed pt on cancellation list and will call if they have any sooner openings. Called pt and made her aware of my efforts to expedite. Unfortunately, nurses who have reviewed her case have patients that require a sooner appt and therefore could not accommodate our request. Pt aware that she IS on a cancellation list and will receive a call if any sooner appts are available. Verbalized acceptance and understanding.

## 2021-10-12 ENCOUNTER — Inpatient Hospital Stay (HOSPITAL_BASED_OUTPATIENT_CLINIC_OR_DEPARTMENT_OTHER): Payer: Managed Care, Other (non HMO) | Admitting: Hematology and Oncology

## 2021-10-12 ENCOUNTER — Other Ambulatory Visit: Payer: Self-pay

## 2021-10-12 ENCOUNTER — Other Ambulatory Visit: Payer: Self-pay | Admitting: Hematology and Oncology

## 2021-10-12 ENCOUNTER — Inpatient Hospital Stay: Payer: Managed Care, Other (non HMO) | Attending: Hematology and Oncology

## 2021-10-12 VITALS — BP 139/75 | HR 63 | Temp 97.4°F | Resp 17 | Wt 194.4 lb

## 2021-10-12 DIAGNOSIS — Z833 Family history of diabetes mellitus: Secondary | ICD-10-CM | POA: Insufficient documentation

## 2021-10-12 DIAGNOSIS — D500 Iron deficiency anemia secondary to blood loss (chronic): Secondary | ICD-10-CM

## 2021-10-12 DIAGNOSIS — Z800 Family history of malignant neoplasm of digestive organs: Secondary | ICD-10-CM | POA: Insufficient documentation

## 2021-10-12 DIAGNOSIS — Z87891 Personal history of nicotine dependence: Secondary | ICD-10-CM | POA: Insufficient documentation

## 2021-10-12 DIAGNOSIS — Z8249 Family history of ischemic heart disease and other diseases of the circulatory system: Secondary | ICD-10-CM | POA: Insufficient documentation

## 2021-10-12 DIAGNOSIS — I10 Essential (primary) hypertension: Secondary | ICD-10-CM | POA: Insufficient documentation

## 2021-10-12 DIAGNOSIS — Z79899 Other long term (current) drug therapy: Secondary | ICD-10-CM | POA: Insufficient documentation

## 2021-10-12 DIAGNOSIS — Z832 Family history of diseases of the blood and blood-forming organs and certain disorders involving the immune mechanism: Secondary | ICD-10-CM

## 2021-10-12 DIAGNOSIS — E611 Iron deficiency: Secondary | ICD-10-CM | POA: Insufficient documentation

## 2021-10-12 LAB — CMP (CANCER CENTER ONLY)
ALT: 12 U/L (ref 0–44)
AST: 15 U/L (ref 15–41)
Albumin: 3.6 g/dL (ref 3.5–5.0)
Alkaline Phosphatase: 46 U/L (ref 38–126)
Anion gap: 8 (ref 5–15)
BUN: 15 mg/dL (ref 6–20)
CO2: 22 mmol/L (ref 22–32)
Calcium: 8.1 mg/dL — ABNORMAL LOW (ref 8.9–10.3)
Chloride: 110 mmol/L (ref 98–111)
Creatinine: 1.45 mg/dL — ABNORMAL HIGH (ref 0.44–1.00)
GFR, Estimated: 45 mL/min — ABNORMAL LOW (ref >60)
Glucose, Bld: 112 mg/dL — ABNORMAL HIGH (ref 70–99)
Potassium: 3.9 mmol/L (ref 3.5–5.1)
Sodium: 140 mmol/L (ref 135–145)
Total Bilirubin: 0.8 mg/dL (ref 0.3–1.2)
Total Protein: 6.7 g/dL (ref 6.5–8.1)

## 2021-10-12 LAB — IRON AND TIBC
Iron: 77 ug/dL (ref 41–142)
Saturation Ratios: 21 % (ref 21–57)
TIBC: 361 ug/dL (ref 236–444)
UIBC: 285 ug/dL (ref 120–384)

## 2021-10-12 LAB — CBC WITH DIFFERENTIAL (CANCER CENTER ONLY)
Abs Immature Granulocytes: 0 10*3/uL (ref 0.00–0.07)
Basophils Absolute: 0 10*3/uL (ref 0.0–0.1)
Basophils Relative: 1 %
Eosinophils Absolute: 0 10*3/uL (ref 0.0–0.5)
Eosinophils Relative: 1 %
HCT: 37.9 % (ref 36.0–46.0)
Hemoglobin: 13.2 g/dL (ref 12.0–15.0)
Immature Granulocytes: 0 %
Lymphocytes Relative: 54 %
Lymphs Abs: 2.9 10*3/uL (ref 0.7–4.0)
MCH: 30.9 pg (ref 26.0–34.0)
MCHC: 34.8 g/dL (ref 30.0–36.0)
MCV: 88.8 fL (ref 80.0–100.0)
Monocytes Absolute: 0.4 10*3/uL (ref 0.1–1.0)
Monocytes Relative: 8 %
Neutro Abs: 1.9 10*3/uL (ref 1.7–7.7)
Neutrophils Relative %: 36 %
Platelet Count: 219 10*3/uL (ref 150–400)
RBC: 4.27 MIL/uL (ref 3.87–5.11)
RDW: 12.3 % (ref 11.5–15.5)
WBC Count: 5.4 10*3/uL (ref 4.0–10.5)
nRBC: 0 % (ref 0.0–0.2)

## 2021-10-12 LAB — FERRITIN: Ferritin: 22 ng/mL (ref 11–307)

## 2021-10-12 LAB — RETIC PANEL
Immature Retic Fract: 6.1 % (ref 2.3–15.9)
RBC.: 4.2 MIL/uL (ref 3.87–5.11)
Retic Count, Absolute: 36.5 10*3/uL (ref 19.0–186.0)
Retic Ct Pct: 0.9 % (ref 0.4–3.1)
Reticulocyte Hemoglobin: 35.9 pg (ref >27.9)

## 2021-10-12 NOTE — Progress Notes (Signed)
[place] [phone]   [phone]  PROGRESS NOTE  Patient Care Team: [name], MD as PCP - General (Family Medicine) Minus Breeding, MD as PCP - Cardiology (Cardiology) Deboraha Sprang, MD as PCP - Electrophysiology (Cardiology) Melvenia Beam, MD as Consulting Physician (Otolaryngology) Lendon Colonel, NP as Nurse Practitioner (Cardiology) Cameron Sprang, MD as Consulting Physician (Neurology) Shara Blazing, FNP (Family Medicine)  Hematological/Oncological History # Iron Deficiency without Anemia  1) 04/30/2020: WBC 5.7, hgb 13.2, MCV 90.6, Plt 283. Iron 52, ferritin 11.7, transferrin 395.  2) 06/04/2020: establish care with Dr. Lorenso Courier 3) 01/13/2021: Iron 38, TIBC 491, Sat 8%, Ferritin 11    #Family History of Thalassemia 1) Patient's grandfather had "thalassemia major" with her mom and brother having "thalassemia minor"  2) Hgb electropharesis negative, though may still carry alpha thalassemia.   Interval History:  Maral Lampe 45 y.o. female with medical history significant for iron deficiency anemia presents for a follow up visit. The patient's last visit was on 05/05/2021. In the interim since the last visit she has continued PO iron therapy every other day.   On exam today [name] notes she has been having difficulty with symptoms of heartburn.  She is established with gastroenterology and was found to have esophagitis.  She was taking Carafate but found this to be difficult to schedule around her other medications.  Also pH monitor was performed did not show any clear evidence of an ascitic process.  She is currently undergoing evaluation for this.  Fortunately she has continued her iron medication throughout this process and has not caused any worsening difficulty with her GERD symptoms.  She notes that she does have some nausea and occasional stabbing cramping.  She notes her energy levels "could be better" and  have overall been "blah".  She currently denies any other overt signs of bleeding.  She reports no fevers, chills, sweats, vomiting or diarrhea.  A full 10 point ROS is listed below.  MEDICAL HISTORY:  Past Medical History:  Diagnosis Date   Anxiety    Back pain    Chest pain    a. 09/2014 Echo: EF 60-65%, no rwma, nl valves;  b. 12/2014 Neg ETT.   Essential hypertension    a. 01/2016 Renal duplex: no RAS- ? renal cyst (renal u/s 4/17 - no cyst, nl study); b. 02/2016 24 hr BP Monitor: Mean SBP ~ 138 with Max of 198. Highest pressures noted in evening hours following placement of cuff.   Fibroid uterus    GERD (gastroesophageal reflux disease)    Hiatal hernia    Palpitations    a. 09/2014 nl event monitor.    SURGICAL HISTORY: Past Surgical History:  Procedure Laterality Date   71 HOUR [place] STUDY N/A 09/01/2021   Procedure: 24 HOUR [place] STUDY;  Surgeon: Thornton Park, MD;  Location: [facility];  Service: Gastroenterology;  Laterality: N/A;   ESOPHAGEAL MANOMETRY N/A 09/01/2021   Procedure: ESOPHAGEAL MANOMETRY (EM);  Surgeon: Thornton Park, MD;  Location: [facility];  Service: Gastroenterology;  Laterality: N/A;   ESOPHAGOGASTRODUODENOSCOPY  01/2020   WISDOM TOOTH EXTRACTION      SOCIAL HISTORY: Social History   Socioeconomic History   Marital status: Single    Spouse name: N/A   Number of children: 0   Years of education: 16   Highest education level: Not on file  Occupational History   Occupation: Research scientist (physical sciences): [employer]  Use   Smoking status: Never   Smokeless tobacco: Never  Vaping Use   Vaping Use: Never used  Substance and Sexual Activity   Alcohol use: No    Alcohol/week: 0.0 standard drinks   Drug use: No   Sexual activity: Not Currently    Partners: Male    Birth control/protection: Pill  Other Topics Concern   Not on file  Social History Narrative   Patient lives at home alone .   Patient works full time at  [employer]   Education college.   Right handed.   Caffeine none   Social Determinants of Radio broadcast assistant Strain: Not on file  Food Insecurity: Not on file  Transportation Needs: Not on file  Physical Activity: Not on file  Stress: Not on file  Social Connections: Not on file  Intimate Partner Violence: Not on file    FAMILY HISTORY: Family History  Problem Relation Age of Onset   Seizures Mother    Epilepsy Mother    GER disease Mother    Cancer Father        Liver   Diabetes Father 22   Hypertension Father    Heart disease Father 77       CEA, LE [place]   Alzheimer's disease Maternal Grandmother    Heart disease Maternal Grandfather    Colon cancer Neg Hx    Breast cancer Neg Hx     ALLERGIES:  is allergic to penicillins, tamiflu [oseltamivir phosphate], tinidazole, and diltiazem.  MEDICATIONS:  Current Outpatient Medications  Medication Sig Dispense Refill   acetaminophen (TYLENOL) 500 MG tablet Take 500 mg by mouth every 6 (six) hours as needed (back pain/ cramps).      atenolol (TENORMIN) 25 MG tablet Take 1/2 tablet in the morning and 1/4 at bedtime     blood glucose meter kit and supplies Test blood sugar twice daily. Dx code: R73.09 1 each 0   cholecalciferol (VITAMIN D3) 25 MCG (1000 UNIT) tablet Take 1,000 Units by mouth daily.     ELDERBERRY PO Take 1 capsule by mouth daily.     famotidine (PEPCID) 20 MG tablet Take 1 tablet (20 mg total) by mouth 2 (two) times daily. 180 tablet 0   ferrous sulfate 325 (65 FE) MG tablet Take 1 tablet (325 mg total) by mouth daily with breakfast. 90 tablet 1   fluticasone (FLONASE) 50 MCG/ACT nasal spray Place 2 sprays into both nostrils daily. (Patient taking differently: Place 2 sprays into both nostrils as needed (seasonal allergies).) 16 g 0   glucose blood (ONETOUCH VERIO) test strip TEST BLOOD SUGAR DAILY 25 each 10   levalbuterol (XOPENEX HFA) 45 MCG/ACT inhaler Inhale 1-2 puffs every 6 hours if needed-  rescue inhaler 15 g 0   LORazepam (ATIVAN) 0.5 MG tablet Take 0.5 tablets (0.25 mg total) by mouth 3 (three) times daily as needed for anxiety.     Multiple Vitamins-Minerals (MULTIVITAMIN GUMMIES ADULT PO) Take 1 tablet by mouth daily.     Norethindrone-Ethinyl Estradiol-Fe Biphas (LO LOESTRIN FE) 1 MG-10 MCG / 10 MCG tablet Take 1 tablet by mouth at bedtime.      ONETOUCH DELICA LANCETS 18E MISC USE TO TEST BLOOD SUGAR DAILY 100 each 3   pantoprazole (PROTONIX) 40 MG tablet Take 1 tablet by mouth twice daily 60 tablet 0   sucralfate (CARAFATE) 1 g tablet Dissolve 1 tablet in a small amount of water to create a slurry and take po QID prn  120 tablet 3   vitamin B-12 (CYANOCOBALAMIN) 1000 MCG tablet Take 1,000 mcg by mouth daily.      dexlansoprazole (DEXILANT) 60 MG capsule Take 1 capsule (60 mg total) by mouth every morning. 30 capsule 11   No current facility-administered medications for this visit.    REVIEW OF SYSTEMS:   Constitutional: ( - ) fevers, ( - )  chills , ( - ) night sweats Eyes: ( - ) blurriness of vision, ( - ) double vision, ( - ) watery eyes Ears, nose, mouth, throat, and face: ( - ) mucositis, ( - ) sore throat Respiratory: ( - ) cough, ( - ) dyspnea, ( - ) wheezes Cardiovascular: ( - ) palpitation, ( - ) chest discomfort, ( - ) lower extremity swelling Gastrointestinal:  ( - ) nausea, ( - ) heartburn, ( - ) change in bowel habits Skin: ( - ) abnormal skin rashes Lymphatics: ( - ) new lymphadenopathy, ( - ) easy bruising Neurological: ( - ) numbness, ( - ) tingling, ( - ) new weaknesses Behavioral/Psych: ( - ) mood change, ( - ) new changes  All other systems were reviewed with the patient and are negative.  PHYSICAL EXAMINATION:  Vitals:   10/12/21 1035  BP: 139/75  Pulse: 63  Resp: 17  Temp: (!) 97.4 F (36.3 C)  SpO2: 100%    Filed Weights   10/12/21 1035  Weight: 194 lb 6.4 oz (88.2 kg)     GENERAL: well appearing middle aged [place] American  female. alert, no distress and comfortable SKIN: skin color, texture, turgor are normal, no rashes or significant lesions EYES: conjunctiva are pink and non-injected, sclera clear LUNGS: clear to auscultation and percussion with normal breathing effort HEART: regular rate & rhythm and no murmurs and no lower extremity edema Musculoskeletal: no cyanosis of digits and no clubbing  PSYCH: alert & oriented x 3, fluent speech NEURO: no focal motor/sensory deficits  LABORATORY DATA:  I have reviewed the data as listed CBC Latest Ref Rng & Units 10/12/2021 07/06/2021 06/13/2021  WBC 4.0 - 10.5 K/uL 5.4 10.5 8.3  Hemoglobin 12.0 - 15.0 g/dL 13.2 12.8 14.0  Hematocrit 36.0 - 46.0 % 37.9 37.5 41.5  Platelets 150 - 400 K/uL 219 275 248    CMP Latest Ref Rng & Units 07/06/2021 06/13/2021 05/05/2021  Glucose 70 - 99 mg/dL 92 106(H) 93  BUN 6 - 20 mg/dL 18 14 15   Creatinine 0.44 - 1.00 mg/dL 1.13(H) 1.04(H) 1.16(H)  Sodium 135 - 145 mmol/L 141 137 138  Potassium 3.5 - 5.1 mmol/L 3.4(L) 3.9 4.2  Chloride 98 - 111 mmol/L 109 101 104  CO2 22 - 32 mmol/L 25 27 24   Calcium 8.9 - 10.3 mg/dL 9.1 8.9 8.6(L)  Total Protein 6.5 - 8.1 g/dL - 7.2 7.2  Total Bilirubin 0.3 - 1.2 mg/dL - 1.1 0.8  Alkaline Phos 38 - 126 U/L - 60 69  AST 15 - 41 U/L - 21 19  ALT 0 - 44 U/L - 17 12    RADIOGRAPHIC STUDIES: No results found.   ASSESSMENT & PLAN Tenna Lacko Felice-Jennings 45 y.o. female with medical history significant for iron deficiency anemia presents for a follow up visit.   After review the labs, the records, discussion with the patient the findings most consistent with persistent iron deficiency without anemia.  Her hemoglobin persistently remains above 12, however her ferritin levels and iron saturation levels are markedly low.  As such I would  recommend that she continue p.o. iron therapy.  The treatment of iron deficiency without anemia is controversial, however given her symptoms I do believe it is  reasonable to pursue p.o. iron therapy at this time.  In the event that this was ineffective and her symptoms fail to improve we could consider administration of IV iron, though without anemia it is difficult to justify.  I encouraged the patient to increase iron rich foods in her diet and continue the iron sulfate pill every other day with a source of vitamin C.  She voiced understanding of the plan moving forward.  # Iron Deficiency without Anemia 2/2 to GYN Bleeding --treatment of iron deficiency without anemia is controversial. Recommend that conitnue taking PO iron pills and try increasing the iron in her diet --continue iron sulfate 39m PO QOD with a source of vitamin C -- indication for IV iron at this time as her symptoms have failed to improve and iron levels have not increased on PO iron therapy.  We will pursue IV iron therapy through [facility] --will check full iron panel today.  --RTC in 6 months time    #Family History of Thalassemia --strong family history of thalassemia, though unclear if Alpha or Beta. Likely Beta based on the description --Hgb electrophoresis showed no evidence of beta thal or sickle cell trait.  Today ordered testing for alpha thalassemia.  --recommend genetic counseling if positive and patient is planning to become pregnant.  No orders of the defined types were placed in this encounter.  All questions were answered. The patient knows to call the clinic with any problems, questions or concerns.  A total of more than 30 minutes were spent on this encounter and over half of that time was spent on counseling and coordination of care as outlined above.   JLedell Peoples MD Department of Hematology/Oncology [facility] CenterPhone: [phone]: [phone]: [email or web address]  10/12/2021 10:47 AM

## 2021-10-13 ENCOUNTER — Telehealth: Payer: Self-pay | Admitting: Pharmacy Technician

## 2021-10-13 ENCOUNTER — Other Ambulatory Visit: Payer: Self-pay | Admitting: Pharmacy Technician

## 2021-10-13 NOTE — Telephone Encounter (Signed)
Auth Submission: denied Payer: [employer] Medication & CPT/J Code(s) submitted: Feraheme (ferumoxytol) [id number] Route of submission (phone, fax, portal): [phone] Auth type: Buy/Bill Units/visits requested: 2 Reference number: [id number] Denied due to patient has not tried and or failed step therapy.  Venofer Infed Ferrilicit  Would you like to try Venofer? Please advise.  Maudie Mercury

## 2021-10-13 NOTE — Telephone Encounter (Signed)
Dr. Lorenso Courier, Hoyt Koch, we will schedule the patient as soon as possible.

## 2021-10-14 ENCOUNTER — Telehealth: Payer: Self-pay | Admitting: *Deleted

## 2021-10-14 ENCOUNTER — Other Ambulatory Visit: Payer: Managed Care, Other (non HMO)

## 2021-10-14 ENCOUNTER — Ambulatory Visit: Payer: Managed Care, Other (non HMO) | Admitting: Hematology and Oncology

## 2021-10-14 NOTE — Telephone Encounter (Signed)
-----   Message from Orson Slick, MD sent at 10/14/2021 12:05 PM EST ----- Please let Mrs. Brooke Turner know that her iron levels have only improved modestly on PO iron therapy. I would recommend we pursue IV iron. A request to have this scheduled has been made to [facility].   ----- Message ----- From: Buel Ream, Lab In [place] Sent: 10/12/2021  10:27 AM EST To: Orson Slick, MD

## 2021-10-14 NOTE — Telephone Encounter (Signed)
TCT patient regarding recent lab results . No answer but was able to leave vm message for her to return this call to [phone].  Her iron levels have had only modest improvement on oral iron. Dr. Lorenso Courier recommends IV iron and this will be arranged at the [facility].

## 2021-10-14 NOTE — Telephone Encounter (Signed)
Received vm message from inquiring  about her lab results from Tuesday, 10/12/21   Please advise.

## 2021-10-15 ENCOUNTER — Other Ambulatory Visit: Payer: Self-pay | Admitting: Gastroenterology

## 2021-10-15 ENCOUNTER — Telehealth: Payer: Self-pay | Admitting: *Deleted

## 2021-10-15 NOTE — Telephone Encounter (Signed)
Received call back from pt . Lab results reviewed and explained to pt that Dr. Lorenso Courier recommends IV iron to boost her iron levels. Reviewed IV iron infusions, side effects etc. Also advised where she would receive these infusions. Pt voiced understanding and will go forward with these infusions. Reminded her of her f/u appt in 6 months.

## 2021-10-15 NOTE — Telephone Encounter (Signed)
TCT patient regarding recent lab results. No naswer but was able to leave vm message for pt to return this call to [phone]. Pt iron levels are only modestly improved. Dr. Lorenso Courier has ordered IV iron @ [facility]

## 2021-10-15 NOTE — Telephone Encounter (Signed)
-----   Message from Orson Slick, MD sent at 10/14/2021 12:05 PM EST ----- Please let Mrs. Creig Hines know that her iron levels have only improved modestly on PO iron therapy. I would recommend we pursue IV iron. A request to have this scheduled has been made to [facility].   ----- Message ----- From: Buel Ream, Lab In [place] Sent: 10/12/2021  10:27 AM EST To: Orson Slick, MD

## 2021-10-19 ENCOUNTER — Ambulatory Visit (INDEPENDENT_AMBULATORY_CARE_PROVIDER_SITE_OTHER): Payer: Managed Care, Other (non HMO) | Admitting: Neurology

## 2021-10-19 ENCOUNTER — Other Ambulatory Visit: Payer: Self-pay

## 2021-10-19 DIAGNOSIS — R2689 Other abnormalities of gait and mobility: Secondary | ICD-10-CM

## 2021-10-19 DIAGNOSIS — R410 Disorientation, unspecified: Secondary | ICD-10-CM

## 2021-10-19 DIAGNOSIS — R42 Dizziness and giddiness: Secondary | ICD-10-CM

## 2021-10-20 LAB — ALPHA-THALASSEMIA GENOTYPR

## 2021-10-21 NOTE — Progress Notes (Signed)
Cardiology Office Note   Date:  32/54/9826   ID:  Brooke Turner, DOB [birth year], MRN [id number]  PCP:  [name], MD  Cardiologist:   Minus Breeding, MD   Chief Complaint  Patient presents with   Chest Pain       History of Present Illness: Brooke Turner is a 45 y.o. female who presents for evaluation of chest pain and palpitations.  Since I last saw her she has done well.  She has had some GI problems with reflux and has had a constant burning discomfort.  Sounds like she has been found to have a dysmotility esophageal disorder and is going to see a specialist.  Prior to this she was walking and jogging and not having any new cardiovascular symptoms.  She has some rare palpitations but these are not as problematic as it had been.  She had no presyncope or syncope.  She is not describing chest pressure, neck or arm discomfort.  She has had no weight gain or edema.   Past Medical History:  Diagnosis Date   Anxiety    Back pain    Chest pain    a. 09/2014 Echo: EF 60-65%, no rwma, nl valves;  b. 12/2014 Neg ETT.   Essential hypertension    a. 01/2016 Renal duplex: no RAS- ? renal cyst (renal u/s 4/17 - no cyst, nl study); b. 02/2016 24 hr BP Monitor: Mean SBP ~ 138 with Max of 198. Highest pressures noted in evening hours following placement of cuff.   Fibroid uterus    GERD (gastroesophageal reflux disease)    Hiatal hernia    Palpitations    a. 09/2014 nl event monitor.    Past Surgical History:  Procedure Laterality Date   5 HOUR [place] STUDY N/A 09/01/2021   Procedure: [place] STUDY;  Surgeon: Thornton Park, MD;  Location: [facility];  Service: Gastroenterology;  Laterality: N/A;   ESOPHAGEAL MANOMETRY N/A 09/01/2021   Procedure: ESOPHAGEAL MANOMETRY (EM);  Surgeon: Thornton Park, MD;  Location: [facility];  Service: Gastroenterology;  Laterality: N/A;   ESOPHAGOGASTRODUODENOSCOPY  01/2020   WISDOM TOOTH EXTRACTION       Current  Outpatient Medications  Medication Sig Dispense Refill   acetaminophen (TYLENOL) 500 MG tablet Take 500 mg by mouth every 6 (six) hours as needed (back pain/ cramps).      blood glucose meter kit and supplies Test blood sugar twice daily. Dx code: R73.09 1 each 0   cholecalciferol (VITAMIN D3) 25 MCG (1000 UNIT) tablet Take 1,000 Units by mouth daily.     dexlansoprazole (DEXILANT) 60 MG capsule Take 1 capsule (60 mg total) by mouth every morning. 30 capsule 11   ELDERBERRY PO Take 1 capsule by mouth daily.     famotidine (PEPCID) 20 MG tablet Take 1 tablet (20 mg total) by mouth 2 (two) times daily. 180 tablet 0   ferrous sulfate 325 (65 FE) MG tablet Take 1 tablet (325 mg total) by mouth daily with breakfast. 90 tablet 1   fluticasone (FLONASE) 50 MCG/ACT nasal spray Place 2 sprays into both nostrils daily. (Patient taking differently: Place 2 sprays into both nostrils as needed (seasonal allergies).) 16 g 0   glucose blood (ONETOUCH VERIO) test strip TEST BLOOD SUGAR DAILY 25 each 10   levalbuterol (XOPENEX HFA) 45 MCG/ACT inhaler Inhale 1-2 puffs every 6 hours if needed- rescue inhaler 15 g 0   LORazepam (ATIVAN) 0.5 MG tablet Take 0.5 tablets (0.25 mg  total) by mouth 3 (three) times daily as needed for anxiety.     Multiple Vitamins-Minerals (MULTIVITAMIN GUMMIES ADULT PO) Take 1 tablet by mouth daily.     Norethindrone-Ethinyl Estradiol-Fe Biphas (LO LOESTRIN FE) 1 MG-10 MCG / 10 MCG tablet Take 1 tablet by mouth at bedtime.      ONETOUCH DELICA LANCETS 42A MISC USE TO TEST BLOOD SUGAR DAILY 100 each 3   pantoprazole (PROTONIX) 40 MG tablet Take 1 tablet by mouth twice daily 60 tablet 2   sucralfate (CARAFATE) 1 g tablet Dissolve 1 tablet in a small amount of water to create a slurry and take po QID prn 120 tablet 3   vitamin B-12 (CYANOCOBALAMIN) 1000 MCG tablet Take 1,000 mcg by mouth daily.      atenolol (TENORMIN) 25 MG tablet Take 1/2 tablet in the morning and 1/4 at bedtime 90 tablet 3    No current facility-administered medications for this visit.    Allergies:   Penicillins, Tamiflu [oseltamivir phosphate], Tinidazole, and Diltiazem    ROS:  Please see the history of present illness.   Otherwise, review of systems are positive for none.   All other systems are reviewed and negative.    PHYSICAL EXAM: VS:  BP 126/74 (BP Location: Left Arm, Patient Position: Sitting, Cuff Size: Large)    Pulse 64    Ht 5' 4"  (1.626 m)    Wt 190 lb (86.2 kg)    BMI 32.61 kg/m  , BMI Body mass index is 32.61 kg/m. GENERAL:  Well appearing NECK:  No jugular venous distention, waveform within normal limits, carotid upstroke brisk and symmetric, no bruits, no thyromegaly LUNGS:  Clear to auscultation bilaterally CHEST:  Unremarkable HEART:  PMI not displaced or sustained,S1 and S2 within normal limits, no S3, no S4, no clicks, no rubs, no murmurs ABD:  Flat, positive bowel sounds normal in frequency in pitch, no bruits, no rebound, no guarding, no midline pulsatile mass, no hepatomegaly, no splenomegaly EXT:  2 plus pulses throughout, no edema, no cyanosis no clubbing   EKG:  EKG is  ordered today. Sinus rhythm, rate 64, axis within normal limits, intervals within normal limits, no acute ST-T wave changes.   Recent Labs: 06/29/2021: Magnesium 2.1; TSH 1.130 10/12/2021: ALT 12; BUN 15; Creatinine 1.45; Hemoglobin 13.2; Platelet Count 219; Potassium 3.9; Sodium 140    Lipid Panel    Component Value Date/Time   CHOL 169 08/25/2020 1221   TRIG 86 08/25/2020 1221   HDL 51 08/25/2020 1221   CHOLHDL 3.3 08/25/2020 1221   LDLCALC 102 (H) 08/25/2020 1221   LDLDIRECT 101 (H) 10/25/2016 1850      Wt Readings from Last 3 Encounters:  10/22/21 190 lb (86.2 kg)  10/12/21 194 lb 6.4 oz (88.2 kg)  09/10/21 197 lb 8 oz (89.6 kg)      Other studies Reviewed: Additional studies/ records that were reviewed today include: None Review of the above records demonstrates:  NA   ASSESSMENT  AND PLAN:  CHEST PAIN:    She has no new anginal chest discomfort.  She had a 0 calcium score in 2020.  No further work-up is suggested.  She will continue with primary risk reduction.  PALPITATIONS: These are at baseline and seem to be treated with the small dose of atenolol and I will continue this.   Current medicines are reviewed at length with the patient today.  The patient does not have concerns regarding medicines.  The following changes have  been made: None  Labs/ tests ordered today include: None  Orders Placed This Encounter  Procedures   EKG 12-Lead      Disposition:   FU with one year   Signed, Minus Breeding, MD  10/24/2021 6:46 PM    [place]

## 2021-10-22 ENCOUNTER — Ambulatory Visit (INDEPENDENT_AMBULATORY_CARE_PROVIDER_SITE_OTHER): Payer: Managed Care, Other (non HMO) | Admitting: Cardiology

## 2021-10-22 ENCOUNTER — Ambulatory Visit: Payer: Managed Care, Other (non HMO) | Admitting: Cardiology

## 2021-10-22 ENCOUNTER — Encounter: Payer: Self-pay | Admitting: Cardiology

## 2021-10-22 ENCOUNTER — Other Ambulatory Visit: Payer: Self-pay

## 2021-10-22 VITALS — BP 126/74 | HR 64 | Ht 64.0 in | Wt 190.0 lb

## 2021-10-22 DIAGNOSIS — R002 Palpitations: Secondary | ICD-10-CM | POA: Diagnosis not present

## 2021-10-22 DIAGNOSIS — R072 Precordial pain: Secondary | ICD-10-CM

## 2021-10-22 MED ORDER — ATENOLOL 25 MG PO TABS: ORAL_TABLET | ORAL | 3 refills | Status: DC

## 2021-10-22 NOTE — Patient Instructions (Signed)
Medication Instructions:  Your Physician recommend you continue on your current medication as directed.    *If you need a refill on your cardiac medications before your next appointment, please call your pharmacy*   Follow-Up: At [facility], you and your health needs are our priority.  As part of our continuing mission to provide you with exceptional heart care, we have created designated Provider Care Teams.  These Care Teams include your primary Cardiologist (physician) and Advanced Practice Providers (APPs -  Physician Assistants and Nurse Practitioners) who all work together to provide you with the care you need, when you need it.  We recommend signing up for the patient portal called "MyChart".  Sign up information is provided on this After Visit Summary.  MyChart is used to connect with patients for Virtual Visits (Telemedicine).  Patients are able to view lab/test results, encounter notes, upcoming appointments, etc.  Non-urgent messages can be sent to your provider as well.   To learn more about what you can do with MyChart, go to [email or web address].    Your next appointment:   1 year(s)  The format for your next appointment:   In Person  Provider:   Minus Breeding, MD

## 2021-10-24 ENCOUNTER — Encounter: Payer: Self-pay | Admitting: Cardiology

## 2021-10-27 ENCOUNTER — Telehealth: Payer: Self-pay | Admitting: Neurology

## 2021-10-27 NOTE — Telephone Encounter (Signed)
Patient would like to know if aquino has received her results back. She has had a couple of episodes since then. She would like to know what her next step would be.

## 2021-10-29 NOTE — Telephone Encounter (Signed)
Pls let her know that the brain wave test was normal. It appears symptoms may be headache-related, we had previously discussed starting a daily headache preventative medication to see if this helps with her symptoms, she would take it every night, we had talked about starting gabapentin. Otherwise, would recommend second opinion at either [facility] or [facility], thanks

## 2021-10-29 NOTE — Telephone Encounter (Signed)
I contacted patient, she doesn't think she needs a second opinion, she would just like to follow up with you. She wanted to let you know, she has had her 4th hemorrhage in her eye. She did see eye dr. No headache she said. Just routing to you for her.thanks

## 2021-10-29 NOTE — Telephone Encounter (Signed)
Did she want to start gabapentin for headaches?

## 2021-11-09 NOTE — Procedures (Signed)
ELECTROENCEPHALOGRAM REPORT  Dates of Recording: 10/19/2021 11:38AM to 10/22/2021 11:34AM  Patient's Name: Brooke Turner MRN: [id number] Date of Birth: 1976-07-20  Referring Provider: Dr. [name]  Procedure: 72-hour ambulatory video EEG  History: This is a 46 year old woman with episodes of waking up disoriented with her heart pounding. EEG for classification.  Medications:  TYLENOL 500 MG tablet TENORMIN 25 MG tablet VITAMIN D3 25 MCG (1000 UNIT) tablet ELDERBERRY PO PEPCID 20 MG tablet 65 FE MG tablet FLONASE 50 MCG/ACT nasal spray XOPENEX HFA 45 MCG/ACT inhaler XYZAL 5 MG tablet ATIVAN 0.5 MG tablet ANTIVERT 25 MG tablet MULTIVITAMIN GUMMIES ADULT PO LO LOESTRIN FE 1 MG-10 MCG / 10 MCG tablet PROTONIX 40 MG tablet IBGARD 90 MG CPCR CARAFATE 1 g tablet LYSTEDA 650 MG TABS tablet CYANOCOBALAMIN 1000 MCG tablet  Technical Summary: This is a 72-hour multichannel digital video EEG recording measured by the international 10-20 system with electrodes applied with paste and impedances below 5000 ohms performed as portable with EKG monitoring.  The digital EEG was referentially recorded, reformatted, and digitally filtered in a variety of bipolar and referential montages for optimal display.    DESCRIPTION OF RECORDING: During maximal wakefulness, the background activity consisted of a symmetric 12 Hz posterior dominant rhythm which was reactive to eye opening.  There were no epileptiform discharges or focal slowing seen in wakefulness.  During the recording, the patient progresses through wakefulness, drowsiness, and Stage 2 sleep. During drowsiness and sleep, there is an increase in theta slowing of the background,at times sharply contoured over the bilateral temporal regions without clear epileptogenic potential.  Again, there were no epileptiform discharges seen.  Events: On 12/13 at 1211 hours, she has visual floaters. Patient is sitting in EEG room, no clinical  changes seen. Electrographically, there were no EEG or EKG changes seen.  On 12/13 at 1247 hours, she has lightheadedness. Patient not on video. Electrographically, there were no EEG or EKG changes seen.  On 12/13 at 1930 hours, she has chest pains. Patient not on video. Electrographically, there were no EEG or EKG changes seen.  On 12/13 at 1951 hours, she feels feet going numb. Patient not on video. Electrographically, there were no EEG or EKG changes seen.  On 12/14 at 0009 hours, she has palpitations. Patient lying in bed, no clinical changes seen. Electrographically, there were no EEG or EKG changes seen.  On 12/14 at 0025 hours, she has a headache. Patient lying in bed, no clinical changes seen. Electrographically, there were no EEG or EKG changes seen.  On 12/14 at 1825 hours, she has a headache. Patient not on video. Electrographically, there were no EEG or EKG changes seen.  On 12/14 at 2330 hours, she feels lightheaded. Patient not on video. Electrographically, there were no EEG or EKG changes seen.  On 12/15 at 0507 hours, she has dry mouth. Patient lying in bed, no clinical changes seen. Electrographically, there were no EEG or EKG changes seen.  On 12/15 at 0917 hours, she had floaters. Patient lying in bed on phone, no clinical changes seen. Electrographically, there were no EEG or EKG changes seen.  On 12/15 at 1031 hours, she has head pressure, tightness. Patient not on video. Electrographically, there were no EEG or EKG changes seen.  On 12/15 at 1231 hours, she feels dizzy. No EEG data at this time.  On 12/15 at 1357 hours, she has a sharp, quick headache. No EEG data at this time.  On 12/16 at 0058 hours,  she has a headache, slight blurred vision. Patient lying in bed, no clinical changes seen. Electrographically, there were no EEG or EKG changes seen.  On 12/16 at 0215 hours, she has sharp chest pain. Patient lying in bed, no clinical changes seen. Electrographically,  there were no EEG or EKG changes seen.  On 12/16 at 0936 hours, she feels lightheaded. Patient not on video. Electrographically, there were no EEG or EKG changes seen.   There were no electrographic seizures seen.  EKG lead was unremarkable.  IMPRESSION: This 72-hour ambulatory video EEG study is normal.   CLINICAL CORRELATION: A normal EEG does not exclude a clinical diagnosis of epilepsy, however with clinical history and episodes above with no EEG change, this is highly unlikely. If further clinical questions remain, inpatient video EEG monitoring may be helpful.   [name], M.D.

## 2021-11-11 ENCOUNTER — Ambulatory Visit: Payer: Managed Care, Other (non HMO)

## 2021-11-11 ENCOUNTER — Telehealth: Payer: Self-pay

## 2021-11-11 MED ORDER — METHYLPREDNISOLONE SODIUM SUCC 125 MG IJ SOLR: 125.0000 mg | Freq: Once | INTRAMUSCULAR | Status: DC | PRN

## 2021-11-11 MED ORDER — ALBUTEROL SULFATE HFA 108 (90 BASE) MCG/ACT IN AERS: 2.0000 | INHALATION_SPRAY | Freq: Once | RESPIRATORY_TRACT | Status: DC | PRN

## 2021-11-11 MED ORDER — SODIUM CHLORIDE 0.9 % IV SOLN: Freq: Once | INTRAVENOUS | Status: DC | PRN

## 2021-11-11 MED ORDER — SODIUM CHLORIDE 0.9 % IV SOLN
200.0000 mg | Freq: Once | INTRAVENOUS | Status: DC
  Filled 2021-11-11: qty 10

## 2021-11-11 MED ORDER — EPINEPHRINE 0.3 MG/0.3ML IJ SOAJ: 0.3000 mg | Freq: Once | INTRAMUSCULAR | Status: DC | PRN

## 2021-11-11 MED ORDER — DIPHENHYDRAMINE HCL 25 MG PO CAPS: 50.0000 mg | ORAL_CAPSULE | Freq: Once | ORAL | Status: DC

## 2021-11-11 MED ORDER — ACETAMINOPHEN 325 MG PO TABS: 650.0000 mg | ORAL_TABLET | Freq: Once | ORAL | Status: DC

## 2021-11-11 MED ORDER — DIPHENHYDRAMINE HCL 50 MG/ML IJ SOLN: 50.0000 mg | Freq: Once | INTRAMUSCULAR | Status: DC | PRN

## 2021-11-11 MED ORDER — FAMOTIDINE IN NACL 20-0.9 MG/50ML-% IV SOLN: 20.0000 mg | Freq: Once | INTRAVENOUS | Status: DC | PRN

## 2021-11-12 ENCOUNTER — Encounter: Payer: Self-pay | Admitting: Hematology and Oncology

## 2021-11-12 ENCOUNTER — Other Ambulatory Visit: Payer: Self-pay | Admitting: Pharmacy Technician

## 2021-11-12 ENCOUNTER — Telehealth: Payer: Self-pay | Admitting: Cardiology

## 2021-11-12 NOTE — Telephone Encounter (Signed)
Patient c/o Palpitations:  High priority if patient c/o lightheadedness, shortness of breath, or chest pain  How long have you had palpitations/irregular HR/ Afib? Are you having the symptoms now? no  Are you currently experiencing lightheadedness, SOB or CP? no  Do you have a history of afib (atrial fibrillation) or irregular heart rhythm? Yes. History of palpitations  Have you checked your BP or HR? (document readings if available): patient uses a pulse ox when her symptoms occur but has not been writing them down.   Are you experiencing any other symptoms? Once she had a sharp poking sensation in her chest behind her breast. Another time the patient felt lightheaded one day and had to sit down and kind of had a "spaced out" out of body feeling.  This was happening before she saw Dr. Percival Spanish in December but has gotten to happen more consistently. There is nothing specific that triggers her symptoms. She is not sure what to do

## 2021-11-12 NOTE — Telephone Encounter (Signed)
Left message to call back  

## 2021-11-19 ENCOUNTER — Telehealth: Payer: Self-pay

## 2021-11-19 ENCOUNTER — Ambulatory Visit: Payer: Managed Care, Other (non HMO)

## 2021-11-19 MED ORDER — ALBUTEROL SULFATE HFA 108 (90 BASE) MCG/ACT IN AERS: 2.0000 | INHALATION_SPRAY | Freq: Once | RESPIRATORY_TRACT | Status: DC | PRN

## 2021-11-19 MED ORDER — DIPHENHYDRAMINE HCL 50 MG/ML IJ SOLN: 50.0000 mg | Freq: Once | INTRAMUSCULAR | Status: DC | PRN

## 2021-11-19 MED ORDER — DIPHENHYDRAMINE HCL 25 MG PO CAPS: 50.0000 mg | ORAL_CAPSULE | Freq: Once | ORAL | Status: DC

## 2021-11-19 MED ORDER — SODIUM CHLORIDE 0.9 % IV SOLN
200.0000 mg | Freq: Once | INTRAVENOUS | Status: DC
  Filled 2021-11-19: qty 10

## 2021-11-19 MED ORDER — METHYLPREDNISOLONE SODIUM SUCC 125 MG IJ SOLR: 125.0000 mg | Freq: Once | INTRAMUSCULAR | Status: DC | PRN

## 2021-11-19 MED ORDER — FAMOTIDINE IN NACL 20-0.9 MG/50ML-% IV SOLN: 20.0000 mg | Freq: Once | INTRAVENOUS | Status: DC | PRN

## 2021-11-19 MED ORDER — SODIUM CHLORIDE 0.9 % IV SOLN: Freq: Once | INTRAVENOUS | Status: DC | PRN

## 2021-11-19 MED ORDER — ACETAMINOPHEN 325 MG PO TABS: 650.0000 mg | ORAL_TABLET | Freq: Once | ORAL | Status: DC

## 2021-11-19 MED ORDER — EPINEPHRINE 0.3 MG/0.3ML IJ SOAJ: 0.3000 mg | Freq: Once | INTRAMUSCULAR | Status: DC | PRN

## 2021-11-19 NOTE — Telephone Encounter (Signed)
Patient returning call from last week.

## 2021-11-19 NOTE — Telephone Encounter (Signed)
Spoke to patient. Patient wanted to to know other option to find out was causing  her symptoms.  She has noticed it is occurring more often,   heart rate  she states  upper 80's  when she notice her symptoms usually in 50-60's.  RN informed patient  the heart rate are within normal arrange.  RN informed patient would need to come to see a provider discuss the options. Patient wanted to call back when she knows her schedule.patient awre to just call bak and speak to ascheduler

## 2021-11-22 NOTE — Telephone Encounter (Signed)
Follow up scheduled

## 2021-11-26 ENCOUNTER — Encounter: Payer: Self-pay | Admitting: Hematology and Oncology

## 2021-11-26 ENCOUNTER — Other Ambulatory Visit (HOSPITAL_COMMUNITY): Payer: Self-pay

## 2021-11-26 ENCOUNTER — Telehealth: Payer: Self-pay

## 2021-11-26 ENCOUNTER — Telehealth: Payer: Self-pay | Admitting: Internal Medicine

## 2021-11-26 MED ORDER — LEVALBUTEROL TARTRATE 45 MCG/ACT IN AERO: INHALATION_SPRAY | RESPIRATORY_TRACT | 0 refills | Status: DC

## 2021-11-26 NOTE — Telephone Encounter (Signed)
°  Tried to contact patient for [facility] referral, pt no showed twice and will now not return/answer calls. Will close this referral and make ordering provider aware.        Adelina Mings, LPN 17/03/1024  8:52 AM called, no answer. Left a message to call back.  Adelina Mings, LPN 77/06/2422 53:61 AM called, no answer. left a message.  Adelina Mings, LPN 44/31/5400 86:76 AM called, no answer. left a message to call back  Adelina Mings, LPN 1/95/0932 67:12 AM Patient missed 2 appointments. I called today to try to reschedule her. No answer and left a voicemail to call back. Thanks!  Adelina Mings, LPN 4/58/0998 33:82 AM called, No answer. left a message to call back. Thanks!

## 2021-11-26 NOTE — Telephone Encounter (Signed)
Called and spoke with pt stating to her that I will send one inhaler of the levalbuterol to the pharmacy for her and stated to her that she must keep her upcoming appt in order to continue to receive refills and she verbalized understanding.  Pt stated that her insurance usually requires a PA to be done on the levalbuterol inhaler so stated to her that I would send this to our pre auth team for them to handle and she verbalized understanding. Routing to prior [place] team.

## 2021-11-26 NOTE — Telephone Encounter (Signed)
Brooke Turner A, CPhT  You 54 minutes ago (1:35 PM)   I called the pharmacy & the Levalbuterol Tartrate is not covered at all even with a PA.  The patient can pay out of pocket for it.  With a [place] card the copay would be $24.45.  Or, the MD can prescribe a different rescue inhaler.      Called and spoke with pt letting her know the info from prior auth team and she verbalized understanding. Nothing further needed.

## 2021-11-29 ENCOUNTER — Other Ambulatory Visit (HOSPITAL_COMMUNITY): Payer: Self-pay

## 2021-11-29 ENCOUNTER — Encounter: Payer: Self-pay | Admitting: Hematology and Oncology

## 2021-12-01 ENCOUNTER — Ambulatory Visit: Payer: Managed Care, Other (non HMO) | Admitting: Nurse Practitioner

## 2021-12-01 NOTE — Progress Notes (Deleted)
@Patient  ID: Brooke Turner, female    DOB: 06-17-1976, 46 y.o.   MRN: [id number]  No chief complaint on file.   Referring provider: [name], MD  HPI: 46 year old female, never smoker??  Followed for obstructive sleep apnea.  She is a patient of Dr. Janee Morn and was last seen in office on 05/23/2019.  Past medical history significant for anxiety, hypertension, chronic rhinitis, GERD, DM2, fibroid uterus, obesity, back pain.  TEST/EVENTS:     Allergies  Allergen Reactions   Penicillins Hives    Has patient had a PCN reaction causing immediate rash, facial/tongue/throat swelling, SOB or lightheadedness with hypotension:Yes Has patient had a PCN reaction causing severe rash involving mucus membranes or skin necrosis:unsure Has patient had a PCN reaction that required hospitalization: Yes Has patient had a PCN reaction occurring within the last 10 years: No If all of the above answers are "NO", then may proceed with Cephalosporin use.      Tamiflu [Oseltamivir Phosphate] Nausea And Vomiting    Excessive vomiting   Tinidazole Other (See Comments)    Caused severe abd pain/type of colitis    Diltiazem Other (See Comments)    Dizzy, felt like she was going to pass out Blood pressure went up per patient     Immunization History  Administered Date(s) Administered   Influenza,inj,Quad PF,6+ Mos 08/12/2015, 08/07/2018, 07/25/2019, 09/08/2020   Influenza-Unspecified 07/25/2019   PFIZER(Purple Top)SARS-COV-2 Vaccination 02/20/2020, 03/18/2020   Tdap 01/24/2018, 06/16/2018    Past Medical History:  Diagnosis Date   Anxiety    Back pain    Chest pain    a. 09/2014 Echo: EF 60-65%, no rwma, nl valves;  b. 12/2014 Neg ETT.   Essential hypertension    a. 01/2016 Renal duplex: no RAS- ? renal cyst (renal u/s 4/17 - no cyst, nl study); b. 02/2016 24 hr BP Monitor: Mean SBP ~ 138 with Max of 198. Highest pressures noted in evening hours following placement of cuff.   Fibroid  uterus    GERD (gastroesophageal reflux disease)    Hiatal hernia    Palpitations    a. 09/2014 nl event monitor.    Tobacco History: Social History   Tobacco Use  Smoking Status Never  Smokeless Tobacco Never   Counseling given: Not Answered   Outpatient Medications Prior to Visit  Medication Sig Dispense Refill   acetaminophen (TYLENOL) 500 MG tablet Take 500 mg by mouth every 6 (six) hours as needed (back pain/ cramps).      atenolol (TENORMIN) 25 MG tablet Take 1/2 tablet in the morning and 1/4 at bedtime 90 tablet 3   blood glucose meter kit and supplies Test blood sugar twice daily. Dx code: R73.09 1 each 0   cholecalciferol (VITAMIN D3) 25 MCG (1000 UNIT) tablet Take 1,000 Units by mouth daily.     dexlansoprazole (DEXILANT) 60 MG capsule Take 1 capsule (60 mg total) by mouth every morning. 30 capsule 11   ELDERBERRY PO Take 1 capsule by mouth daily.     famotidine (PEPCID) 20 MG tablet Take 1 tablet (20 mg total) by mouth 2 (two) times daily. 180 tablet 0   ferrous sulfate 325 (65 FE) MG tablet Take 1 tablet (325 mg total) by mouth daily with breakfast. 90 tablet 1   fluticasone (FLONASE) 50 MCG/ACT nasal spray Place 2 sprays into both nostrils daily. (Patient taking differently: Place 2 sprays into both nostrils as needed (seasonal allergies).) 16 g 0   glucose blood (ONETOUCH VERIO)  test strip TEST BLOOD SUGAR DAILY 25 each 10   levalbuterol (XOPENEX HFA) 45 MCG/ACT inhaler Inhale 1-2 puffs every 6 hours if needed- rescue inhaler 15 g 0   LORazepam (ATIVAN) 0.5 MG tablet Take 0.5 tablets (0.25 mg total) by mouth 3 (three) times daily as needed for anxiety.     Multiple Vitamins-Minerals (MULTIVITAMIN GUMMIES ADULT PO) Take 1 tablet by mouth daily.     Norethindrone-Ethinyl Estradiol-Fe Biphas (LO LOESTRIN FE) 1 MG-10 MCG / 10 MCG tablet Take 1 tablet by mouth at bedtime.      ONETOUCH DELICA LANCETS 21Y MISC USE TO TEST BLOOD SUGAR DAILY 100 each 3   pantoprazole  (PROTONIX) 40 MG tablet Take 1 tablet by mouth twice daily 60 tablet 2   sucralfate (CARAFATE) 1 g tablet Dissolve 1 tablet in a small amount of water to create a slurry and take po QID prn 120 tablet 3   vitamin B-12 (CYANOCOBALAMIN) 1000 MCG tablet Take 1,000 mcg by mouth daily.      Facility-Administered Medications Prior to Visit  Medication Dose Route Frequency Provider Last Rate Last Admin   0.9 %  sodium chloride infusion   Intravenous Once PRN Orson Slick, MD       acetaminophen (TYLENOL) tablet 650 mg  650 mg Oral Once Orson Slick, MD       albuterol (VENTOLIN HFA) 108 (90 Base) MCG/ACT inhaler 2 puff  2 puff Inhalation Once PRN Orson Slick, MD       diphenhydrAMINE (BENADRYL) capsule 50 mg  50 mg Oral Once Orson Slick, MD       diphenhydrAMINE (BENADRYL) injection 50 mg  50 mg Intravenous Once PRN Orson Slick, MD       EPINEPHrine (EPI-PEN) injection 0.3 mg  0.3 mg Intramuscular Once PRN Orson Slick, MD       famotidine (PEPCID) IVPB 20 mg premix  20 mg Intravenous Once PRN Orson Slick, MD       iron sucrose (VENOFER) 200 mg in sodium chloride 0.9 % 100 mL IVPB  200 mg Intravenous Once Tresa Res, RPH       methylPREDNISolone sodium succinate (SOLU-MEDROL) 125 mg/2 mL injection 125 mg  125 mg Intravenous Once PRN Orson Slick, MD         Review of Systems:   Constitutional: No weight loss or gain, night sweats, fevers, chills, fatigue, or lassitude. HEENT: No headaches, difficulty swallowing, tooth/dental problems, or sore throat. No sneezing, itching, ear ache, nasal congestion, or post nasal drip CV:  No chest pain, orthopnea, PND, swelling in lower extremities, anasarca, dizziness, palpitations, syncope Resp: No shortness of breath with exertion or at rest. No excess mucus or change in color of mucus. No productive or non-productive. No hemoptysis. No wheezing.  No chest wall deformity GI:  No heartburn, indigestion, abdominal  pain, nausea, vomiting, diarrhea, change in bowel habits, loss of appetite, bloody stools.  GU: No dysuria, change in color of urine, urgency or frequency.  No flank pain, no hematuria  Skin: No rash, lesions, ulcerations MSK:  No joint pain or swelling.  No decreased range of motion.  No back pain. Neuro: No dizziness or lightheadedness.  Psych: No depression or anxiety. Mood stable.     Physical Exam:  There were no vitals taken for this visit.  GEN: Pleasant, interactive, well-nourished/chronically-ill appearing/acutely-ill appearing/poorly-nourished/morbidly obese; in no acute distress.****** HEENT:  Normocephalic and atraumatic.  EACs patent bilaterally. TM pearly gray with present light reflex bilaterally. PERRLA. Sclera white. Nasal turbinates pink, moist and patent bilaterally. No rhinorrhea present. Oropharynx pink and moist, without exudate or edema. No lesions, ulcerations, or postnasal drip.  NECK:  Supple w/ fair ROM. No JVD present. Normal carotid impulses w/o bruits. Thyroid symmetrical with no goiter or nodules palpated. No lymphadenopathy.   CV: RRR, no m/r/g, no peripheral edema. Pulses intact, +2 bilaterally. No cyanosis, pallor or clubbing. PULMONARY:  Unlabored, regular breathing. Clear bilaterally A&P w/o wheezes/rales/rhonchi. No accessory muscle use. No dullness to percussion. GI: BS present and normoactive. Soft, non-tender to palpation. No organomegaly or masses detected. No CVA tenderness. MSK: No erythema, warmth or tenderness. Cap refil <2 sec all extrem. No deformities or joint swelling noted.  Neuro: A/Ox3. No focal deficits noted.   Skin: Warm, no lesions or rashe Psych: Normal affect and behavior. Judgement and thought content appropriate.     Lab Results:  CBC    Component Value Date/Time   WBC 5.4 10/12/2021 1021   WBC 10.5 07/06/2021 0213   RBC 4.20 10/12/2021 1022   RBC 4.27 10/12/2021 1021   HGB 13.2 10/12/2021 1021   HGB 14.0 07/02/2018 1838    HCT 37.9 10/12/2021 1021   HCT 42.8 07/02/2018 1838   PLT 219 10/12/2021 1021   PLT 291 07/02/2018 1838   MCV 88.8 10/12/2021 1021   MCV 90.5 10/01/2018 1743   MCV 92 07/02/2018 1838   MCH 30.9 10/12/2021 1021   MCHC 34.8 10/12/2021 1021   RDW 12.3 10/12/2021 1021   RDW 13.1 07/02/2018 1838   LYMPHSABS 2.9 10/12/2021 1021   LYMPHSABS 3.4 (H) 10/25/2016 1842   MONOABS 0.4 10/12/2021 1021   EOSABS 0.0 10/12/2021 1021   EOSABS 0.0 10/25/2016 1842   BASOSABS 0.0 10/12/2021 1021   BASOSABS 0.0 10/25/2016 1842    BMET    Component Value Date/Time   NA 140 10/12/2021 1021   NA 140 04/24/2019 1651   K 3.9 10/12/2021 1021   CL 110 10/12/2021 1021   CO2 22 10/12/2021 1021   GLUCOSE 112 (H) 10/12/2021 1021   BUN 15 10/12/2021 1021   BUN 12 04/24/2019 1651   CREATININE 1.45 (H) 10/12/2021 1021   CREATININE 1.10 06/08/2016 1802   CALCIUM 8.1 (L) 10/12/2021 1021   GFRNONAA 45 (L) 10/12/2021 1021   GFRAA >60 06/04/2020 1430    BNP    Component Value Date/Time   BNP 36.0 10/08/2018 1458     Imaging:  No results found.    PFT Results Latest Ref Rng & Units 05/16/2019 04/01/2015  FVC-Pre L 3.58 3.35  FVC-Predicted Pre % 122 112  FVC-Post L 3.60 3.37  FVC-Predicted Post % 123 113  Pre FEV1/FVC % % 85 85  Post FEV1/FCV % % 88 85  FEV1-Pre L 3.03 2.84  FEV1-Predicted Pre % 126 115  FEV1-Post L 3.16 2.87  DLCO uncorrected ml/min/mmHg 29.01 -  DLCO UNC% % 138 -  DLCO corrected ml/min/mmHg 29.10 -  DLCO COR %Predicted % 139 -  DLVA Predicted % 144 -  TLC L 5.19 -  TLC % Predicted % 105 -  RV % Predicted % 114 -    No results found for: NITRICOXIDE      Assessment & Plan:   No problem-specific Assessment & Plan notes found for this encounter.     Clayton Bibles, NP 12/01/2021  Pt aware and understands NP's role.

## 2021-12-02 ENCOUNTER — Encounter: Payer: Self-pay | Admitting: Hematology and Oncology

## 2021-12-02 ENCOUNTER — Other Ambulatory Visit (HOSPITAL_COMMUNITY): Payer: Self-pay

## 2021-12-02 ENCOUNTER — Telehealth: Payer: Self-pay | Admitting: Pharmacy Technician

## 2021-12-02 NOTE — Telephone Encounter (Signed)
Patient Advocate Encounter  Received notification from [facility]) that prior authorization for LEVALBUTEROL 45MCG  is required.   PA NOT SUBMITTED Key [id number] Status is pending  PRODUCT SERVICE NOT [facility] will continue to follow  Luciano Cutter, CPhT Patient Advocate Phone: [phone] Fax:  [phone]

## 2021-12-06 ENCOUNTER — Other Ambulatory Visit (HOSPITAL_COMMUNITY): Payer: Self-pay

## 2021-12-06 NOTE — Telephone Encounter (Signed)
Levalbuterol nor the alternative Albuterol is covered under this patients insurance. The telephone note from 1.20.23 reports patient can use GoodRx for the Levalbuterol. Called and left a HIPPA compliant message for patient to call back to better explain, but have not spoken with patient as of yet.

## 2021-12-06 NOTE — Telephone Encounter (Signed)
I'm not sure what this message means or if there is something that needs to be done.  Please advise

## 2021-12-10 ENCOUNTER — Other Ambulatory Visit: Payer: Self-pay

## 2021-12-10 ENCOUNTER — Encounter: Payer: Self-pay | Admitting: Nurse Practitioner

## 2021-12-10 ENCOUNTER — Ambulatory Visit (INDEPENDENT_AMBULATORY_CARE_PROVIDER_SITE_OTHER): Payer: Managed Care, Other (non HMO) | Admitting: Nurse Practitioner

## 2021-12-10 VITALS — BP 138/70 | HR 68 | Temp 98.5°F | Ht 64.37 in | Wt 192.8 lb

## 2021-12-10 DIAGNOSIS — J45990 Exercise induced bronchospasm: Secondary | ICD-10-CM | POA: Insufficient documentation

## 2021-12-10 DIAGNOSIS — R0609 Other forms of dyspnea: Secondary | ICD-10-CM

## 2021-12-10 MED ORDER — BUDESONIDE-FORMOTEROL FUMARATE 80-4.5 MCG/ACT IN AERO
1.0000 | INHALATION_SPRAY | Freq: Two times a day (BID) | RESPIRATORY_TRACT | 3 refills | Status: DC
Start: 2021-12-10 — End: 2023-10-10

## 2021-12-10 MED ORDER — LEVALBUTEROL TARTRATE 45 MCG/ACT IN AERO: INHALATION_SPRAY | RESPIRATORY_TRACT | 0 refills | Status: DC

## 2021-12-10 NOTE — Telephone Encounter (Signed)
Called and spoke with patient who states that she was able to use GoodRx and it brought the price down a lot. Advised her to let us know if she has any issues or if it becomes to expensive and we will see if there is a mail order pharmacy or DME pharmacy to see if its cheaper. She expressed understanding. Nothing further needed at this time.

## 2021-12-10 NOTE — Assessment & Plan Note (Addendum)
Concern for given increased SOB and wheezing with exercise. Previously evaluated by cardiology with no remarkable findings. Former PFTs in 2020 without significant BD response. She does have some allergy type symptoms but recent eos 0. Discussed trial of ICS/LABA therapy - pt agreed to but would also like to undergo further testing. Methacholine challenge ordered. Advised to use 2 puffs levalbuterol 15 min prior to activity.   Patient Instructions  Continue levalbuterol 1-2 puffs every 6 hours as needed for shortness of breath or wheezing. Use 2 puffs 15 minutes prior to exercise -Continue flonase nasal spray 1-2 sprays each nostril daily  -Trial Symbicort 80 1-2 puffs Twice daily. Brush tongue and rinse mouth afterwards.  -Over the counter Zyrtec or Claritin during allergy season or with allergy symptoms  Methacholine challenge ordered today - someone will contact you for scheduling.   Follow up in one month with Dr. Annamaria Boots or Alanson Aly. If symptoms do not improve or worsen, please contact office for sooner follow up or seek emergency care.

## 2021-12-10 NOTE — Patient Instructions (Addendum)
Continue levalbuterol 1-2 puffs every 6 hours as needed for shortness of breath or wheezing. Use 2 puffs 15 minutes prior to exercise -Continue flonase nasal spray 1-2 sprays each nostril daily  -Trial Symbicort 80 1-2 puffs Twice daily. Brush tongue and rinse mouth afterwards.  -Over the counter Zyrtec or Claritin during allergy season or with allergy symptoms  Methacholine challenge ordered today - someone will contact you for scheduling.   Follow up in one month with Dr. Annamaria Boots or Alanson Aly. If symptoms do not improve or worsen, please contact office for sooner follow up or seek emergency care.

## 2021-12-10 NOTE — Assessment & Plan Note (Addendum)
No significant findings on previous testing to correlate dyspnea to. Exercise induced bronchospasm vs deconditioning. Occasionally has some PND but symptoms are primarily upon exertion/exercise.Cleared from cardiology standpoint. Methacholine challenge ordered. See above plan.

## 2021-12-10 NOTE — Progress Notes (Addendum)
<[id number]>_1<\/[id number]>_3  ID: Brooke Turner, female    DOB: 05/26/1976, 46 y.o.   MRN: [id number]  Chief Complaint  Patient presents with   Follow-up    Refill levalbuterol  SOBE when exercising or singing    Referring provider: [name], MD  HPI: 46 year old female, former smoker followed for snoring and dyspnea. She is a patient of Dr. Janee Morn and last seen in office on 05/23/2019. Past medical history significant for HTN, deviated nasal septum, chronic rhinitis, GERD, DM II, fibroid uterus, obesity, anxiety, chronic back pain.   TEST/EVENTS:  05/16/2019 PFTs: FVC 3.6 (123), 3.16 (132), ratio 88, TLC 105%, DLCO 139%. No BD. 06/11/2019 Split night: AHI 0, SpO2 low 95% 10/13/2020 Split night: AHI 0.5/hr, SpO2 low 95% 01/05/2021 echocardiogram: EF 60-65%. Mod to severe AVS.   05/23/2019: OV with Dr Annamaria Boots. Pt questioned accuracy of HST and requested in lab study d/t frequent night awakenings with SOB. Requested change from albuterol to Xopenex for less stimulation.  12/10/2021: Today - overdue follow up Patient presents today for overdue follow up. She continues to experience shortness of breath with exercise and exertion, especially singing. She also has wheezing and occasional lightheadedness while exercising and feels as though it is limiting her. She does occasionally experience some PND at night and has had negative split night studies in the back. She denies orthopnea, chest pain, lower extremity edema, or cough. Her levalbuterol does help with shortness of breath but she does not use it frequently. She has been cleared from a cardiology standpoint. Overall, she feels well but is concerned she can't exercise because of her breathing.   Allergies  Allergen Reactions   Penicillins Hives    Has patient had a PCN reaction causing immediate rash, facial/tongue/throat swelling, SOB or lightheadedness with hypotension:Yes Has patient had a PCN reaction causing severe rash involving mucus membranes or  skin necrosis:unsure Has patient had a PCN reaction that required hospitalization: Yes Has patient had a PCN reaction occurring within the last 10 years: No If all of the above answers are "NO", then may proceed with Cephalosporin use.      Tamiflu [Oseltamivir Phosphate] Nausea And Vomiting    Excessive vomiting   Tinidazole Other (See Comments)    Caused severe abd pain/type of colitis    Diltiazem Other (See Comments)    Dizzy, felt like she was going to pass out Blood pressure went up per patient     Immunization History  Administered Date(s) Administered   Influenza,inj,Quad PF,6+ Mos 08/12/2015, 08/07/2018, 07/25/2019, 09/08/2020   Influenza-Unspecified 07/25/2019, 09/18/2021   PFIZER(Purple Top)SARS-COV-2 Vaccination 02/20/2020, 03/18/2020   Tdap 01/24/2018, 06/16/2018   Unspecified SARS-COV-2 Vaccination 02/20/2020, 03/18/2020, 03/09/2021    Past Medical History:  Diagnosis Date   Anxiety    Back pain    Chest pain    a. 09/2014 Echo: EF 60-65%, no rwma, nl valves;  b. 12/2014 Neg ETT.   Essential hypertension    a. 01/2016 Renal duplex: no RAS- ? renal cyst (renal u/s 4/17 - no cyst, nl study); b. 02/2016 24 hr BP Monitor: Mean SBP ~ 138 with Max of 198. Highest pressures noted in evening hours following placement of cuff.   Fibroid uterus    GERD (gastroesophageal reflux disease)    Hiatal hernia    Palpitations    a. 09/2014 nl event monitor.    Tobacco History: Social History   Tobacco Use  Smoking Status Former   Packs/day: 0.25   Years: 12.00  Pack years: 3.00   Types: Cigarettes   Start date: 1999   Quit date: 2011   Years since quitting: 12.1  Smokeless Tobacco Never   Counseling given: Not Answered   Outpatient Medications Prior to Visit  Medication Sig Dispense Refill   acetaminophen (TYLENOL) 500 MG tablet Take 500 mg by mouth every 6 (six) hours as needed (back pain/ cramps).      atenolol (TENORMIN) 25 MG tablet Take 1/2 tablet in the  morning and 1/4 at bedtime 90 tablet 3   blood glucose meter kit and supplies Test blood sugar twice daily. Dx code: R73.09 1 each 0   cholecalciferol (VITAMIN D3) 25 MCG (1000 UNIT) tablet Take 1,000 Units by mouth daily.     dexlansoprazole (DEXILANT) 60 MG capsule Take 1 capsule (60 mg total) by mouth every morning. 30 capsule 11   ELDERBERRY PO Take 1 capsule by mouth daily.     famotidine (PEPCID) 20 MG tablet Take 1 tablet (20 mg total) by mouth 2 (two) times daily. 180 tablet 0   ferrous sulfate 325 (65 FE) MG tablet Take 1 tablet (325 mg total) by mouth daily with breakfast. 90 tablet 1   fluticasone (FLONASE) 50 MCG/ACT nasal spray Place 2 sprays into both nostrils daily. (Patient taking differently: Place 2 sprays into both nostrils as needed (seasonal allergies).) 16 g 0   glucose blood (ONETOUCH VERIO) test strip TEST BLOOD SUGAR DAILY 25 each 10   LORazepam (ATIVAN) 0.5 MG tablet Take 0.5 tablets (0.25 mg total) by mouth 3 (three) times daily as needed for anxiety.     Multiple Vitamins-Minerals (MULTIVITAMIN GUMMIES ADULT PO) Take 1 tablet by mouth daily.     Norethindrone-Ethinyl Estradiol-Fe Biphas (LO LOESTRIN FE) 1 MG-10 MCG / 10 MCG tablet Take 1 tablet by mouth at bedtime.      ONETOUCH DELICA LANCETS 23N MISC USE TO TEST BLOOD SUGAR DAILY 100 each 3   progesterone (PROMETRIUM) 200 MG capsule Take 200 mg by mouth at bedtime.     sucralfate (CARAFATE) 1 g tablet Dissolve 1 tablet in a small amount of water to create a slurry and take po QID prn 120 tablet 3   vitamin B-12 (CYANOCOBALAMIN) 1000 MCG tablet Take 1,000 mcg by mouth daily.      levalbuterol (XOPENEX HFA) 45 MCG/ACT inhaler Inhale 1-2 puffs every 6 hours if needed- rescue inhaler 15 g 0   pantoprazole (PROTONIX) 40 MG tablet Take 1 tablet by mouth twice daily 60 tablet 2   Facility-Administered Medications Prior to Visit  Medication Dose Route Frequency Provider Last Rate Last Admin   0.9 %  sodium chloride infusion    Intravenous Once PRN Orson Slick, MD       acetaminophen (TYLENOL) tablet 650 mg  650 mg Oral Once Orson Slick, MD       albuterol (VENTOLIN HFA) 108 (90 Base) MCG/ACT inhaler 2 puff  2 puff Inhalation Once PRN Orson Slick, MD       diphenhydrAMINE (BENADRYL) capsule 50 mg  50 mg Oral Once Orson Slick, MD       diphenhydrAMINE (BENADRYL) injection 50 mg  50 mg Intravenous Once PRN Orson Slick, MD       EPINEPHrine (EPI-PEN) injection 0.3 mg  0.3 mg Intramuscular Once PRN Orson Slick, MD       famotidine (PEPCID) IVPB 20 mg premix  20 mg Intravenous Once PRN Narda Rutherford  T IV, MD       iron sucrose (VENOFER) 200 mg in sodium chloride 0.9 % 100 mL IVPB  200 mg Intravenous Once Tresa Res, RPH       methylPREDNISolone sodium succinate (SOLU-MEDROL) 125 mg/2 mL injection 125 mg  125 mg Intravenous Once PRN Orson Slick, MD         Review of Systems:   Constitutional: No weight loss or gain, night sweats, fevers, chills, fatigue, or lassitude. HEENT: No headaches, difficulty swallowing, tooth/dental problems, or sore throat. No  itching, ear ache. +occasional allergy symptoms and nasal drainage CV:  +occasional lightheadedness with exercise. No chest pain, orthopnea, PND, swelling in lower extremities, anasarca, dizziness, palpitations, syncope Resp: +shortness of breath with exertion; wheezing with exercise/exertion. No excess mucus or change in color of mucus. No productive or non-productive. No hemoptysis. No wheezing.  No chest wall deformity GI:  No heartburn, indigestion, abdominal pain, nausea, vomiting, diarrhea, change in bowel habits, loss of appetite, bloody stools.  GU: No dysuria, change in color of urine, urgency or frequency.  No flank pain, no hematuria  Skin: No rash, lesions, ulcerations MSK:  No joint pain or swelling.  No decreased range of motion.  No back pain. Neuro: No dizziness or lightheadedness.  Psych: No depression or  anxiety. Mood stable.     Physical Exam:  BP 138/70 (BP Location: Right Arm, Cuff Size: Normal)    Pulse 68    Temp 98.5 F (36.9 C) (Oral)    Ht 5' 4.37" (1.635 m)    Wt 192 lb 12.8 oz (87.5 kg)    SpO2 99%    BMI 32.71 kg/m   GEN: Pleasant, interactive, well-appearing; obese; in no acute distress. HEENT:  Normocephalic and atraumatic. EACs patent bilaterally. TM pearly gray with present light reflex bilaterally. PERRLA. Sclera white. Nasal turbinates pink, moist and patent bilaterally. No rhinorrhea present. Oropharynx pink and moist, without exudate or edema. No lesions, ulcerations, or postnasal drip.  NECK:  Supple w/ fair ROM. No JVD present. Normal carotid impulses w/o bruits. Thyroid symmetrical with no goiter or nodules palpated. No lymphadenopathy.   CV: RRR, no m/r/g, no peripheral edema. Pulses intact, +2 bilaterally. No cyanosis, pallor or clubbing. PULMONARY:  Unlabored, regular breathing. Clear bilaterally A&P w/o wheezes/rales/rhonchi. No accessory muscle use. No dullness to percussion. GI: BS present and normoactive. Soft, non-tender to palpation. No organomegaly or masses detected. No CVA tenderness. MSK: No erythema, warmth or tenderness. Cap refil <2 sec all extrem. No deformities or joint swelling noted.  Neuro: A/Ox3. No focal deficits noted.   Skin: Warm, no lesions or rashe Psych: Normal affect and behavior. Judgement and thought content appropriate.     Lab Results:  CBC    Component Value Date/Time   WBC 5.4 10/12/2021 1021   WBC 10.5 07/06/2021 0213   RBC 4.20 10/12/2021 1022   RBC 4.27 10/12/2021 1021   HGB 13.2 10/12/2021 1021   HGB 14.0 07/02/2018 1838   HCT 37.9 10/12/2021 1021   HCT 42.8 07/02/2018 1838   PLT 219 10/12/2021 1021   PLT 291 07/02/2018 1838   MCV 88.8 10/12/2021 1021   MCV 90.5 10/01/2018 1743   MCV 92 07/02/2018 1838   MCH 30.9 10/12/2021 1021   MCHC 34.8 10/12/2021 1021   RDW 12.3 10/12/2021 1021   RDW 13.1 07/02/2018 1838    LYMPHSABS 2.9 10/12/2021 1021   LYMPHSABS 3.4 (H) 10/25/2016 1842   MONOABS 0.4 10/12/2021 1021  EOSABS 0.0 10/12/2021 1021   EOSABS 0.0 10/25/2016 1842   BASOSABS 0.0 10/12/2021 1021   BASOSABS 0.0 10/25/2016 1842    BMET    Component Value Date/Time   NA 140 10/12/2021 1021   NA 140 04/24/2019 1651   K 3.9 10/12/2021 1021   CL 110 10/12/2021 1021   CO2 22 10/12/2021 1021   GLUCOSE 112 (H) 10/12/2021 1021   BUN 15 10/12/2021 1021   BUN 12 04/24/2019 1651   CREATININE 1.45 (H) 10/12/2021 1021   CREATININE 1.10 06/08/2016 1802   CALCIUM 8.1 (L) 10/12/2021 1021   GFRNONAA 45 (L) 10/12/2021 1021   GFRAA >60 06/04/2020 1430    BNP    Component Value Date/Time   BNP 36.0 10/08/2018 1458     Imaging:  No results found.    PFT Results Latest Ref Rng & Units 05/16/2019 04/01/2015  FVC-Pre L 3.58 3.35  FVC-Predicted Pre % 122 112  FVC-Post L 3.60 3.37  FVC-Predicted Post % 123 113  Pre FEV1/FVC % % 85 85  Post FEV1/FCV % % 88 85  FEV1-Pre L 3.03 2.84  FEV1-Predicted Pre % 126 115  FEV1-Post L 3.16 2.87  DLCO uncorrected ml/min/mmHg 29.01 -  DLCO UNC% % 138 -  DLCO corrected ml/min/mmHg 29.10 -  DLCO COR %Predicted % 139 -  DLVA Predicted % 144 -  TLC L 5.19 -  TLC % Predicted % 105 -  RV % Predicted % 114 -    No results found for: NITRICOXIDE      Assessment & Plan:   Mild exercise-induced asthma Concern for given increased SOB and wheezing with exercise. Previously evaluated by cardiology with no remarkable findings. Former PFTs in 2020 without significant BD response. She does have some allergy type symptoms but recent eos 0. Discussed trial of ICS/LABA therapy - pt agreed to but would also like to undergo further testing. Methacholine challenge ordered. Advised to use 2 puffs levalbuterol 15 min prior to activity.   Patient Instructions  Continue levalbuterol 1-2 puffs every 6 hours as needed for shortness of breath or wheezing. Use 2 puffs 15 minutes  prior to exercise -Continue flonase nasal spray 1-2 sprays each nostril daily  -Trial Symbicort 80 1-2 puffs Twice daily. Brush tongue and rinse mouth afterwards.  -Over the counter Zyrtec or Claritin during allergy season or with allergy symptoms  Methacholine challenge ordered today - someone will contact you for scheduling.   Follow up in one month with Dr. Annamaria Boots or Alanson Aly. If symptoms do not improve or worsen, please contact office for sooner follow up or seek emergency care.    Dyspnea No significant findings on previous testing to correlate dyspnea to. Exercise induced bronchospasm vs deconditioning. Occasionally has some PND but symptoms are primarily upon exertion/exercise.Cleared from cardiology standpoint. Methacholine challenge ordered. See above plan.    Clayton Bibles, NP 12/16/2021  Pt aware and understands NP's role.

## 2021-12-13 ENCOUNTER — Telehealth: Payer: Self-pay

## 2021-12-13 ENCOUNTER — Encounter: Payer: Self-pay | Admitting: Hematology and Oncology

## 2021-12-13 ENCOUNTER — Other Ambulatory Visit (HOSPITAL_COMMUNITY): Payer: Self-pay

## 2021-12-13 NOTE — Telephone Encounter (Signed)
Patient Advocate Encounter   Received notification from [facility] that prior authorization for Symbicort 80-4.100mcg is required by his/her insurance [employer].   PA submitted on 12/13/21  Key#:  [id number]  Status is pending    [facility] will continue to follow:  Patient Advocate Fax: [phone]

## 2021-12-14 ENCOUNTER — Telehealth: Payer: Self-pay | Admitting: Gastroenterology

## 2021-12-14 NOTE — Telephone Encounter (Signed)
Inbound call from patient states that she would like a 3 month supply if possible for  Protonix. Seeking advice, please advise.

## 2021-12-15 ENCOUNTER — Other Ambulatory Visit: Payer: Self-pay

## 2021-12-15 DIAGNOSIS — K219 Gastro-esophageal reflux disease without esophagitis: Secondary | ICD-10-CM

## 2021-12-15 MED ORDER — PANTOPRAZOLE SODIUM 40 MG PO TBEC: 40.0000 mg | DELAYED_RELEASE_TABLET | Freq: Two times a day (BID) | ORAL | 3 refills | Status: AC

## 2021-12-15 NOTE — Telephone Encounter (Signed)
Patient Advocate Encounter  Received notification from [employer] that the request for prior authorization for Symbicort has been denied due to the pt not having asthma, COPD, emphysema, chronic bronchitis or postinfectious cough.    [facility] Patient Advocate Fax: [phone]

## 2021-12-15 NOTE — Telephone Encounter (Signed)
She is suspected to have exercise induced bronchospasm/asthma, which was noted in her last visit.

## 2021-12-15 NOTE — Telephone Encounter (Signed)
Brooke Turner, can you please advise?

## 2021-12-15 NOTE — Telephone Encounter (Signed)
Outpatient Medication Detail   Disp Refills Start End   pantoprazole (PROTONIX) 40 MG tablet 180 tablet 3 12/15/2021    Sig - Route: Take 1 tablet (40 mg total) by mouth 2 (two) times daily. - Oral

## 2021-12-16 ENCOUNTER — Other Ambulatory Visit (HOSPITAL_COMMUNITY): Payer: Self-pay

## 2021-12-16 NOTE — Telephone Encounter (Signed)
Exercise induced asthma diagnosis code: J45.990

## 2021-12-16 NOTE — Telephone Encounter (Signed)
We can attach the note. There is a diagnosis of exercise induced asthma on the note! Thanks!

## 2021-12-16 NOTE — Telephone Encounter (Signed)
Patient Advocate Encounter  Prior Authorization for Symbicort 80-4.90mcg has been approved.    PA# [id number]  Effective dates: 11/16/21 through 12/16/22  Per Test Claim Patients co-pay is $50.   Spoke with Pharmacy to Process.  Patient Advocate Fax: [phone]

## 2021-12-30 ENCOUNTER — Ambulatory Visit: Payer: Managed Care, Other (non HMO) | Admitting: Cardiology

## 2021-12-31 ENCOUNTER — Telehealth: Payer: Self-pay | Admitting: *Deleted

## 2021-12-31 NOTE — Telephone Encounter (Signed)
TCT patient as she had called on 12/30/21. She stated she had questions but did not elaborate. No answer to this morning's call. LVM for pt to return call at her convenience

## 2022-01-05 ENCOUNTER — Ambulatory Visit: Payer: Managed Care, Other (non HMO) | Admitting: Nurse Practitioner

## 2022-01-05 NOTE — Progress Notes (Deleted)
@Patient  ID: Brooke Turner, female    DOB: 16-Dec-1975, 46 y.o.   MRN: [id number]  No chief complaint on file.   Referring provider: Sherren Mocha, MD  HPI: 46 year old female, former smoker followed for snoring and dyspnea upon exertion.  At most recent visit was suspected to have exercise-induced bronchospasm.  She is a patient of Dr. Roxy Cedar and last seen in office on 12/10/2021.  Past medical history significant for hypertension, deviated nasal septum, chronic rhinitis, GERD, DM2, fibroid uterus, obesity, anxiety, chronic back pain  TEST/EVENTS:  05/16/2019 PFTs: FVC 3.6 (123), 3.16 (132), ratio 88, TLC 105%, DLCO 139%.  No BD. 06/11/2019 split-night: AHI 0, SPO2 low 95% 10/13/2020 split-night: AHI 0.5/hr, SPO2 low 95% 01/05/2021 echocardiogram: EF 60 to 65%.  Moderate to severe AVS.  05/23/2019: OV with Dr. Maple Hudson.  Patient question accuracy of HST and requested an in lab study due to frequent night awakenings with shortness of breath.  Requested change from albuterol to Xopenex for last stimulation  12/10/2021: OV with Osman Calzadilla NP. Still experiencing SOB with exercise and exertion, especially singing, along with wheezing. Neg previous split nights but some PND. Felt as though levalbuterol helped. Recent eos 0. Trial ICS/LABA for suspected exercise induced bronchospasm. Pt also requested further testing - methacholine challenge ordered. Advised to use rescue 15 min prior to exercise and start antihistamine during allergy season.   Allergies  Allergen Reactions   Penicillins Hives    Has patient had a PCN reaction causing immediate rash, facial/tongue/throat swelling, SOB or lightheadedness with hypotension:Yes Has patient had a PCN reaction causing severe rash involving mucus membranes or skin necrosis:unsure Has patient had a PCN reaction that required hospitalization: Yes Has patient had a PCN reaction occurring within the last 10 years: No If all of the above answers are "NO", then may  proceed with Cephalosporin use.      Tamiflu [Oseltamivir Phosphate] Nausea And Vomiting    Excessive vomiting   Tinidazole Other (See Comments)    Caused severe abd pain/type of colitis    Diltiazem Other (See Comments)    Dizzy, felt like she was going to pass out Blood pressure went up per patient     Immunization History  Administered Date(s) Administered   Influenza,inj,Quad PF,6+ Mos 08/12/2015, 08/07/2018, 07/25/2019, 09/08/2020   Influenza-Unspecified 07/25/2019, 09/18/2021   PFIZER(Purple Top)SARS-COV-2 Vaccination 02/20/2020, 03/18/2020   Tdap 01/24/2018, 06/16/2018   Unspecified SARS-COV-2 Vaccination 02/20/2020, 03/18/2020, 03/09/2021    Past Medical History:  Diagnosis Date   Anxiety    Back pain    Chest pain    a. 09/2014 Echo: EF 60-65%, no rwma, nl valves;  b. 12/2014 Neg ETT.   Essential hypertension    a. 01/2016 Renal duplex: no RAS- ? renal cyst (renal u/s 4/17 - no cyst, nl study); b. 02/2016 24 hr BP Monitor: Mean SBP ~ 138 with Max of 198. Highest pressures noted in evening hours following placement of cuff.   Fibroid uterus    GERD (gastroesophageal reflux disease)    Hiatal hernia    Palpitations    a. 09/2014 nl event monitor.    Tobacco History: Social History   Tobacco Use  Smoking Status Former   Packs/day: 0.25   Years: 12.00   Pack years: 3.00   Types: Cigarettes   Start date: 1999   Quit date: 2011   Years since quitting: 12.1  Smokeless Tobacco Never   Counseling given: Not Answered   Outpatient Medications Prior to Visit  Medication  Sig Dispense Refill   acetaminophen (TYLENOL) 500 MG tablet Take 500 mg by mouth every 6 (six) hours as needed (back pain/ cramps).      atenolol (TENORMIN) 25 MG tablet Take 1/2 tablet in the morning and 1/4 at bedtime 90 tablet 3   blood glucose meter kit and supplies Test blood sugar twice daily. Dx code: R73.09 1 each 0   budesonide-formoterol (SYMBICORT) 80-4.5 MCG/ACT inhaler Inhale 1-2  puffs into the lungs 2 (two) times daily. 1 each 3   cholecalciferol (VITAMIN D3) 25 MCG (1000 UNIT) tablet Take 1,000 Units by mouth daily.     dexlansoprazole (DEXILANT) 60 MG capsule Take 1 capsule (60 mg total) by mouth every morning. 30 capsule 11   ELDERBERRY PO Take 1 capsule by mouth daily.     famotidine (PEPCID) 20 MG tablet Take 1 tablet (20 mg total) by mouth 2 (two) times daily. 180 tablet 0   ferrous sulfate 325 (65 FE) MG tablet Take 1 tablet (325 mg total) by mouth daily with breakfast. 90 tablet 1   fluticasone (FLONASE) 50 MCG/ACT nasal spray Place 2 sprays into both nostrils daily. (Patient taking differently: Place 2 sprays into both nostrils as needed (seasonal allergies).) 16 g 0   glucose blood (ONETOUCH VERIO) test strip TEST BLOOD SUGAR DAILY 25 each 10   levalbuterol (XOPENEX HFA) 45 MCG/ACT inhaler Inhale 1-2 puffs every 6 hours if needed for shortness of breath or wheezing- rescue inhaler. Use 2 puffs 15 minutes prior to exercise. 15 g 0   LORazepam (ATIVAN) 0.5 MG tablet Take 0.5 tablets (0.25 mg total) by mouth 3 (three) times daily as needed for anxiety.     Multiple Vitamins-Minerals (MULTIVITAMIN GUMMIES ADULT PO) Take 1 tablet by mouth daily.     Norethindrone-Ethinyl Estradiol-Fe Biphas (LO LOESTRIN FE) 1 MG-10 MCG / 10 MCG tablet Take 1 tablet by mouth at bedtime.      ONETOUCH DELICA LANCETS 33G MISC USE TO TEST BLOOD SUGAR DAILY 100 each 3   pantoprazole (PROTONIX) 40 MG tablet Take 1 tablet (40 mg total) by mouth 2 (two) times daily. 180 tablet 3   progesterone (PROMETRIUM) 200 MG capsule Take 200 mg by mouth at bedtime.     sucralfate (CARAFATE) 1 g tablet Dissolve 1 tablet in a small amount of water to create a slurry and take po QID prn 120 tablet 3   vitamin B-12 (CYANOCOBALAMIN) 1000 MCG tablet Take 1,000 mcg by mouth daily.      Facility-Administered Medications Prior to Visit  Medication Dose Route Frequency Provider Last Rate Last Admin   0.9 %   sodium chloride infusion   Intravenous Once PRN Jaci Standard, MD       acetaminophen (TYLENOL) tablet 650 mg  650 mg Oral Once Jaci Standard, MD       albuterol (VENTOLIN HFA) 108 (90 Base) MCG/ACT inhaler 2 puff  2 puff Inhalation Once PRN Jaci Standard, MD       diphenhydrAMINE (BENADRYL) capsule 50 mg  50 mg Oral Once Jaci Standard, MD       diphenhydrAMINE (BENADRYL) injection 50 mg  50 mg Intravenous Once PRN Jaci Standard, MD       EPINEPHrine (EPI-PEN) injection 0.3 mg  0.3 mg Intramuscular Once PRN Ulysees Barns IV, MD       famotidine (PEPCID) IVPB 20 mg premix  20 mg Intravenous Once PRN Jaci Standard, MD  iron sucrose (VENOFER) 200 mg in sodium chloride 0.9 % 100 mL IVPB  200 mg Intravenous Once Desma Mcgregor, RPH       methylPREDNISolone sodium succinate (SOLU-MEDROL) 125 mg/2 mL injection 125 mg  125 mg Intravenous Once PRN Jaci Standard, MD         Review of Systems:   Constitutional: No weight loss or gain, night sweats, fevers, chills, fatigue, or lassitude. HEENT: No headaches, difficulty swallowing, tooth/dental problems, or sore throat. No sneezing, itching, ear ache, nasal congestion, or post nasal drip CV:  No chest pain, orthopnea, PND, swelling in lower extremities, anasarca, dizziness, palpitations, syncope Resp: No shortness of breath with exertion or at rest. No excess mucus or change in color of mucus. No productive or non-productive. No hemoptysis. No wheezing.  No chest wall deformity GI:  No heartburn, indigestion, abdominal pain, nausea, vomiting, diarrhea, change in bowel habits, loss of appetite, bloody stools.  GU: No dysuria, change in color of urine, urgency or frequency.  No flank pain, no hematuria  Skin: No rash, lesions, ulcerations MSK:  No joint pain or swelling.  No decreased range of motion.  No back pain. Neuro: No dizziness or lightheadedness.  Psych: No depression or anxiety. Mood stable.     Physical  Exam:  There were no vitals taken for this visit.  GEN: Pleasant, interactive, well-nourished/chronically-ill appearing/acutely-ill appearing/poorly-nourished/morbidly obese; in no acute distress.****** HEENT:  Normocephalic and atraumatic. EACs patent bilaterally. TM pearly gray with present light reflex bilaterally. PERRLA. Sclera white. Nasal turbinates pink, moist and patent bilaterally. No rhinorrhea present. Oropharynx pink and moist, without exudate or edema. No lesions, ulcerations, or postnasal drip.  NECK:  Supple w/ fair ROM. No JVD present. Normal carotid impulses w/o bruits. Thyroid symmetrical with no goiter or nodules palpated. No lymphadenopathy.   CV: RRR, no m/r/g, no peripheral edema. Pulses intact, +2 bilaterally. No cyanosis, pallor or clubbing. PULMONARY:  Unlabored, regular breathing. Clear bilaterally A&P w/o wheezes/rales/rhonchi. No accessory muscle use. No dullness to percussion. GI: BS present and normoactive. Soft, non-tender to palpation. No organomegaly or masses detected. No CVA tenderness. MSK: No erythema, warmth or tenderness. Cap refil <2 sec all extrem. No deformities or joint swelling noted.  Neuro: A/Ox3. No focal deficits noted.   Skin: Warm, no lesions or rashe Psych: Normal affect and behavior. Judgement and thought content appropriate.     Lab Results:  CBC    Component Value Date/Time   WBC 5.4 10/12/2021 1021   WBC 10.5 07/06/2021 0213   RBC 4.20 10/12/2021 1022   RBC 4.27 10/12/2021 1021   HGB 13.2 10/12/2021 1021   HGB 14.0 07/02/2018 1838   HCT 37.9 10/12/2021 1021   HCT 42.8 07/02/2018 1838   PLT 219 10/12/2021 1021   PLT 291 07/02/2018 1838   MCV 88.8 10/12/2021 1021   MCV 90.5 10/01/2018 1743   MCV 92 07/02/2018 1838   MCH 30.9 10/12/2021 1021   MCHC 34.8 10/12/2021 1021   RDW 12.3 10/12/2021 1021   RDW 13.1 07/02/2018 1838   LYMPHSABS 2.9 10/12/2021 1021   LYMPHSABS 3.4 (H) 10/25/2016 1842   MONOABS 0.4 10/12/2021 1021    EOSABS 0.0 10/12/2021 1021   EOSABS 0.0 10/25/2016 1842   BASOSABS 0.0 10/12/2021 1021   BASOSABS 0.0 10/25/2016 1842    BMET    Component Value Date/Time   NA 140 10/12/2021 1021   NA 140 04/24/2019 1651   K 3.9 10/12/2021 1021   CL 110 10/12/2021  1021   CO2 22 10/12/2021 1021   GLUCOSE 112 (H) 10/12/2021 1021   BUN 15 10/12/2021 1021   BUN 12 04/24/2019 1651   CREATININE 1.45 (H) 10/12/2021 1021   CREATININE 1.10 06/08/2016 1802   CALCIUM 8.1 (L) 10/12/2021 1021   GFRNONAA 45 (L) 10/12/2021 1021   GFRAA >60 06/04/2020 1430    BNP    Component Value Date/Time   BNP 36.0 10/08/2018 1458     Imaging:  No results found.    PFT Results Latest Ref Rng & Units 05/16/2019 04/01/2015  FVC-Pre L 3.58 3.35  FVC-Predicted Pre % 122 112  FVC-Post L 3.60 3.37  FVC-Predicted Post % 123 113  Pre FEV1/FVC % % 85 85  Post FEV1/FCV % % 88 85  FEV1-Pre L 3.03 2.84  FEV1-Predicted Pre % 126 115  FEV1-Post L 3.16 2.87  DLCO uncorrected ml/min/mmHg 29.01 -  DLCO UNC% % 138 -  DLCO corrected ml/min/mmHg 29.10 -  DLCO COR %Predicted % 139 -  DLVA Predicted % 144 -  TLC L 5.19 -  TLC % Predicted % 105 -  RV % Predicted % 114 -    No results found for: NITRICOXIDE      Assessment & Plan:   No problem-specific Assessment & Plan notes found for this encounter.   I spent *** minutes of dedicated to the care of this patient on the date of this encounter to include pre-visit review of records, face-to-face time with the patient discussing conditions above, post visit ordering of testing, clinical documentation with the electronic health record, making appropriate referrals as documented, and communicating necessary findings to members of the patients care team.  Noemi Chapel, NP 01/05/2022  Pt aware and understands NP's role.

## 2022-01-10 ENCOUNTER — Emergency Department (HOSPITAL_COMMUNITY)
Admission: EM | Admit: 2022-01-10 | Discharge: 2022-01-11 | Disposition: A | Discharge status: Home / Self Care | Payer: Managed Care, Other (non HMO) | Attending: Emergency Medicine | Admitting: Emergency Medicine

## 2022-01-10 DIAGNOSIS — R509 Fever, unspecified: Secondary | ICD-10-CM | POA: Insufficient documentation

## 2022-01-10 DIAGNOSIS — R1010 Upper abdominal pain, unspecified: Secondary | ICD-10-CM | POA: Diagnosis not present

## 2022-01-10 DIAGNOSIS — Z5321 Procedure and treatment not carried out due to patient leaving prior to being seen by health care provider: Secondary | ICD-10-CM | POA: Diagnosis not present

## 2022-01-10 DIAGNOSIS — R197 Diarrhea, unspecified: Secondary | ICD-10-CM | POA: Diagnosis not present

## 2022-01-10 DIAGNOSIS — R079 Chest pain, unspecified: Secondary | ICD-10-CM | POA: Diagnosis not present

## 2022-01-10 DIAGNOSIS — R112 Nausea with vomiting, unspecified: Secondary | ICD-10-CM | POA: Diagnosis not present

## 2022-01-10 DIAGNOSIS — Z20822 Contact with and (suspected) exposure to covid-19: Secondary | ICD-10-CM | POA: Diagnosis not present

## 2022-01-10 LAB — RESP PANEL BY RT-PCR (FLU A&B, COVID) ARPGX2
Influenza A by PCR: NEGATIVE
Influenza B by PCR: NEGATIVE
SARS Coronavirus 2 by RT PCR: NEGATIVE

## 2022-01-10 LAB — URINALYSIS, ROUTINE W REFLEX MICROSCOPIC
Bilirubin Urine: NEGATIVE
Glucose, UA: NEGATIVE mg/dL
Ketones, ur: NEGATIVE mg/dL
Leukocytes,Ua: NEGATIVE
Nitrite: NEGATIVE
Protein, ur: NEGATIVE mg/dL
Specific Gravity, Urine: 1.024 (ref 1.005–1.030)
pH: 5 (ref 5.0–8.0)

## 2022-01-10 LAB — CBC WITH DIFFERENTIAL/PLATELET
Abs Immature Granulocytes: 0.02 10*3/uL (ref 0.00–0.07)
Basophils Absolute: 0 10*3/uL (ref 0.0–0.1)
Basophils Relative: 0 %
Eosinophils Absolute: 0 10*3/uL (ref 0.0–0.5)
Eosinophils Relative: 0 %
HCT: 40.7 % (ref 36.0–46.0)
Hemoglobin: 14.2 g/dL (ref 12.0–15.0)
Immature Granulocytes: 0 %
Lymphocytes Relative: 18 %
Lymphs Abs: 1.1 10*3/uL (ref 0.7–4.0)
MCH: 31.3 pg (ref 26.0–34.0)
MCHC: 34.9 g/dL (ref 30.0–36.0)
MCV: 89.8 fL (ref 80.0–100.0)
Monocytes Absolute: 0.4 10*3/uL (ref 0.1–1.0)
Monocytes Relative: 6 %
Neutro Abs: 4.6 10*3/uL (ref 1.7–7.7)
Neutrophils Relative %: 76 %
Platelets: 235 10*3/uL (ref 150–400)
RBC: 4.53 MIL/uL (ref 3.87–5.11)
RDW: 12.4 % (ref 11.5–15.5)
WBC: 6.1 10*3/uL (ref 4.0–10.5)
nRBC: 0 % (ref 0.0–0.2)

## 2022-01-10 NOTE — ED Triage Notes (Signed)
Patient reports upper chest pain and cramps across her abdomen last night with emesis and diarrhea , denies SOB or diaphoresis .  ?

## 2022-01-10 NOTE — ED Provider Triage Note (Signed)
Emergency Medicine Provider Triage Evaluation Note ? ?Brooke Turner , a 46 y.o. female  was evaluated in triage.  Pt complains of gradual onset, constant, achy, upper abdominal pain began last night.  Patient also complains of nausea, nonbloody nonbilious emesis, diarrhea.  She mentions that she has been having intermittent left-sided chest pain for the past week however did not think much of it.  She was able to exercise yesterday without any chest pain.  She continues to have pain today however.  She states that she follows with cardiologist.  Does mention that she ate at a [place] last night prior to symptoms starting.  Her friend ate at [employer] with her as well and is somewhat sick however not having as much GI symptoms as her.  She mentions she had a fever of 100.0 earlier today as well. No previous abdominal surgeries.  ? ?Review of Systems  ?Positive: + chest pain, abdominal pain, nausea, vomiting, diarrhea, fever ?Negative: - urinary symptoms ? ?Physical Exam  ?BP (!) 164/101 (BP Location: Right Arm)   Pulse (!) 102   Temp 99.1 ?F (37.3 ?C) (Oral)   Resp 18   SpO2 95%  ?Gen:   Awake, no distress   ?Resp:  Normal effort  ?MSK:   Moves extremities without difficulty  ?Other:  Diffuse abdominal TTP. No rebound or guarding.  ? ?Medical Decision Making  ?Medically screening exam initiated at 10:55 PM.  Appropriate orders placed.  Brooke Turner was informed that the remainder of the evaluation will be completed by another provider, this initial triage assessment does not replace that evaluation, and the importance of remaining in the ED until their evaluation is complete. ? ? ?  ?Eustaquio Maize, PA-C ?01/10/22 2257 ? ?

## 2022-01-11 LAB — I-STAT BETA HCG BLOOD, ED (MC, WL, AP ONLY): I-stat hCG, quantitative: <5 m[IU]/mL (ref <5)

## 2022-01-11 LAB — COMPREHENSIVE METABOLIC PANEL
ALT: 16 U/L (ref 0–44)
AST: 20 U/L (ref 15–41)
Albumin: 3.7 g/dL (ref 3.5–5.0)
Alkaline Phosphatase: 58 U/L (ref 38–126)
Anion gap: 11 (ref 5–15)
BUN: 7 mg/dL (ref 6–20)
CO2: 21 mmol/L — ABNORMAL LOW (ref 22–32)
Calcium: 8.5 mg/dL — ABNORMAL LOW (ref 8.9–10.3)
Chloride: 105 mmol/L (ref 98–111)
Creatinine, Ser: 1.14 mg/dL — ABNORMAL HIGH (ref 0.44–1.00)
GFR, Estimated: >60 mL/min (ref >60)
Glucose, Bld: 120 mg/dL — ABNORMAL HIGH (ref 70–99)
Potassium: 3.6 mmol/L (ref 3.5–5.1)
Sodium: 137 mmol/L (ref 135–145)
Total Bilirubin: 1 mg/dL (ref 0.3–1.2)
Total Protein: 6.9 g/dL (ref 6.5–8.1)

## 2022-01-11 LAB — LIPASE, BLOOD: Lipase: 26 U/L (ref 11–51)

## 2022-01-11 LAB — LACTIC ACID, PLASMA: Lactic Acid, Venous: 0.9 mmol/L (ref 0.5–1.9)

## 2022-01-11 LAB — TROPONIN I (HIGH SENSITIVITY)
Troponin I (High Sensitivity): 3 ng/L (ref <18)
Troponin I (High Sensitivity): 3 ng/L (ref <18)

## 2022-01-11 MED ORDER — ONDANSETRON 4 MG PO TBDP
4.0000 mg | ORAL_TABLET | Freq: Once | ORAL | Status: AC
  Administered 2022-01-11: 4 mg via ORAL
  Filled 2022-01-11: qty 1

## 2022-01-11 NOTE — ED Notes (Signed)
Pt stated after this tech finished taking her vitals that she was going to have to leave. She stated that if she started feeling any worse later on she would come back. Moving pt OTF. ?

## 2022-01-14 ENCOUNTER — Encounter (HOSPITAL_COMMUNITY): Payer: Self-pay

## 2022-01-14 ENCOUNTER — Emergency Department (HOSPITAL_COMMUNITY)
Admission: EM | Admit: 2022-01-14 | Discharge: 2022-01-15 | Disposition: A | Discharge status: Home / Self Care | Payer: Managed Care, Other (non HMO) | Attending: Emergency Medicine | Admitting: Emergency Medicine

## 2022-01-14 ENCOUNTER — Telehealth: Payer: Self-pay | Admitting: Gastroenterology

## 2022-01-14 ENCOUNTER — Other Ambulatory Visit: Payer: Self-pay

## 2022-01-14 DIAGNOSIS — R1084 Generalized abdominal pain: Secondary | ICD-10-CM | POA: Insufficient documentation

## 2022-01-14 DIAGNOSIS — Z87891 Personal history of nicotine dependence: Secondary | ICD-10-CM | POA: Insufficient documentation

## 2022-01-14 DIAGNOSIS — I10 Essential (primary) hypertension: Secondary | ICD-10-CM | POA: Insufficient documentation

## 2022-01-14 LAB — LIPASE, BLOOD: Lipase: 43 U/L (ref 11–51)

## 2022-01-14 LAB — CBC
HCT: 39.8 % (ref 36.0–46.0)
Hemoglobin: 13.1 g/dL (ref 12.0–15.0)
MCH: 30.2 pg (ref 26.0–34.0)
MCHC: 32.9 g/dL (ref 30.0–36.0)
MCV: 91.7 fL (ref 80.0–100.0)
Platelets: 229 10*3/uL (ref 150–400)
RBC: 4.34 MIL/uL (ref 3.87–5.11)
RDW: 12.2 % (ref 11.5–15.5)
WBC: 7.6 10*3/uL (ref 4.0–10.5)
nRBC: 0 % (ref 0.0–0.2)

## 2022-01-14 LAB — BASIC METABOLIC PANEL
Anion gap: 10 (ref 5–15)
BUN: 11 mg/dL (ref 6–20)
CO2: 26 mmol/L (ref 22–32)
Calcium: 8.4 mg/dL — ABNORMAL LOW (ref 8.9–10.3)
Chloride: 105 mmol/L (ref 98–111)
Creatinine, Ser: 1.17 mg/dL — ABNORMAL HIGH (ref 0.44–1.00)
GFR, Estimated: 58 mL/min — ABNORMAL LOW (ref >60)
Glucose, Bld: 109 mg/dL — ABNORMAL HIGH (ref 70–99)
Potassium: 3.6 mmol/L (ref 3.5–5.1)
Sodium: 141 mmol/L (ref 135–145)

## 2022-01-14 LAB — I-STAT BETA HCG BLOOD, ED (MC, WL, AP ONLY): I-stat hCG, quantitative: <5 m[IU]/mL (ref <5)

## 2022-01-14 NOTE — Telephone Encounter (Signed)
Patient went to ER on 3/6 for diarrhea and vomiting (she thought she might have food poisoning); however, the wait was over 12 hours and she left before she actually got to see a doctor.  They gave her Zofran for her nausea and were going to do an ultrasound.  She also reports she has not had a BM since Monday and her stomach is still very sore.  She is asking if 1) our office can prescribe Zofran for her, 2) our office can refer her to get the ultrasound, 3) there is a safe laxative she can take, and 4) she should come in for an OV before all of the above can be done.  Please call patient and advise. ?

## 2022-01-14 NOTE — Telephone Encounter (Signed)
Following response received from Dr. Tarri Glenn re: pt concern: ? ?No orders from [place]. She needs an office visit first. Has not been seen since 09/10/21. Referral to [facility] was for different problems, (esophageal related concerns). Otherwise, urgent care needs to be through the ED. Perhaps her primary care provider could see her earlier given? ? ?Called pt and informed her of Dr. Tarri Glenn response as documented above. Pt declined to schedule an appt at this time. Still requested orders for both Zofran and laxative. Educated that given her symptoms (ie: nausea), the length of time since her last BM, and given the fact that the ED was planning to proceed with imaging to further evaluation her abd pain, her symptoms DOES indicate a more urgent/emergent evaluation to r/o the possibility for serious concerns such as bowel perf. This is not something that can be treated in an OP setting and if found, would require surgical intervention, all of which would occur in a hosp setting. Given the date of her last OV, we cannot provide orders nor recommendations at this time. Pt informed me that her PCP office has closed and is no longer in practice. States she is in the process of looking for another PCP. Verbalized acceptance and understanding of all information provided. ?

## 2022-01-14 NOTE — ED Triage Notes (Signed)
Pt states that she has been having n/v/d for the past few days, now continues to have generalized abd pain ?

## 2022-01-15 ENCOUNTER — Encounter (HOSPITAL_COMMUNITY): Payer: Self-pay

## 2022-01-15 ENCOUNTER — Other Ambulatory Visit: Payer: Self-pay

## 2022-01-15 MED ORDER — DICYCLOMINE HCL 20 MG PO TABS: 20.0000 mg | ORAL_TABLET | Freq: Two times a day (BID) | ORAL | 0 refills | Status: DC

## 2022-01-15 MED ORDER — ONDANSETRON 4 MG PO TBDP: 4.0000 mg | ORAL_TABLET | Freq: Three times a day (TID) | ORAL | 0 refills | Status: DC | PRN

## 2022-01-15 NOTE — Discharge Instructions (Addendum)
You were evaluated in the Emergency Department and after careful evaluation, we did not find any emergent condition requiring admission or further testing in the hospital. ? ?Your exam/testing today is overall reassuring.  Recommend use of the Zofran medication as needed for nausea, use of the Bentyl medication as needed for crampy abdominal pain.  If you continue to have less bowel movements than normal, recommend over-the-counter MiraLAX. ? ?Please return to the Emergency Department if you experience any worsening of your condition.   Thank you for allowing [place] to be a part of your care. ?

## 2022-01-15 NOTE — ED Provider Notes (Signed)
?[facility] ?[facility] Emergency Department ?Provider Note ?MRN:  [id number]  ?Arrival date & time: 01/15/22    ? ?Chief Complaint   ?Abdominal Pain ?  ?History of Present Illness   ?Brooke Turner is a 46 y.o. year-old female with a history of hypertension presenting to the ED with chief complaint of abdominal pain. ? ?Patient had sudden onset of abdominal cramping and diarrhea a few hours after eating at a [employer].  Has had on and off cramping, diarrhea, nausea for the past 3 to 4 days.  Came to the emergency department a few days ago but left from the waiting room due to extended wait times.  Tried to follow-up with her GI doctor in the office but was advised to come back to the emergency department to complete her evaluation. ? ?Review of Systems  ?A thorough review of systems was obtained and all systems are negative except as noted in the HPI and PMH.  ? ?Patient's Health History   ? ?Past Medical History:  ?Diagnosis Date  ? Anxiety   ? Back pain   ? Chest pain   ? a. 09/2014 Echo: EF 60-65%, no rwma, nl valves;  b. 12/2014 Neg ETT.  ? Essential hypertension   ? a. 01/2016 Renal duplex: no RAS- ? renal cyst (renal u/s 4/17 - no cyst, nl study); b. 02/2016 24 hr BP Monitor: Mean SBP ~ 138 with Max of 198. Highest pressures noted in evening hours following placement of cuff.  ? Fibroid uterus   ? GERD (gastroesophageal reflux disease)   ? Hiatal hernia   ? Palpitations   ? a. 09/2014 nl event monitor.  ?  ?Past Surgical History:  ?Procedure Laterality Date  ? 23 HOUR [place] STUDY N/A 09/01/2021  ? Procedure: [place] STUDY;  Surgeon: Thornton Park, MD;  Location: [facility] ENDOSCOPY;  Service: Gastroenterology;  Laterality: N/A;  ? ESOPHAGEAL MANOMETRY N/A 09/01/2021  ? Procedure: ESOPHAGEAL MANOMETRY (EM);  Surgeon: Thornton Park, MD;  Location: [facility] ENDOSCOPY;  Service: Gastroenterology;  Laterality: N/A;  ? ESOPHAGOGASTRODUODENOSCOPY  01/2020  ? WISDOM TOOTH EXTRACTION    ?   ?Family History  ?Problem Relation Age of Onset  ? Seizures Mother   ? Epilepsy Mother   ? GER disease Mother   ? Cancer Father   ?     Liver  ? Diabetes Father 52  ? Hypertension Father   ? Heart disease Father 21  ?     CEA, LE [place]  ? Alzheimer's disease Maternal Grandmother   ? Heart disease Maternal Grandfather   ? Colon cancer Neg Hx   ? Breast cancer Neg Hx   ?  ?Social History  ? ?Socioeconomic History  ? Marital status: Single  ?  Spouse name: N/A  ? Number of children: 0  ? Years of education: 72  ? Highest education level: Not on file  ?Occupational History  ? Occupation: Therapist, art  ?  Employer: [employer]  ?Tobacco Use  ? Smoking status: Former  ?  Packs/day: 0.25  ?  Years: 12.00  ?  Pack years: 3.00  ?  Types: Cigarettes  ?  Start date: 1999  ?  Quit date: 2011  ?  Years since quitting: 12.1  ? Smokeless tobacco: Never  ?Vaping Use  ? Vaping Use: Never used  ?Substance and Sexual Activity  ? Alcohol use: No  ?  Alcohol/week: 0.0 standard drinks  ? Drug use: No  ? Sexual activity: Not Currently  ?  Partners: Male  ?  Birth control/protection: Pill  ?Other Topics Concern  ? Not on file  ?Social History Narrative  ? Patient lives at home alone .  ? Patient works full time at [employer]  ? Education college.  ? Right handed.  ? Caffeine none  ? ?Social Determinants of Health  ? ?Financial Resource Strain: Not on file  ?Food Insecurity: Not on file  ?Transportation Needs: Not on file  ?Physical Activity: Not on file  ?Stress: Not on file  ?Social Connections: Not on file  ?Intimate Partner Violence: Not on file  ?  ? ?Physical Exam  ? ?Vitals:  ? 01/15/22 0020 01/15/22 0025  ?BP: (!) 153/94   ?Pulse: 77 70  ?Resp: 18 20  ?Temp:    ?SpO2: 100% 100%  ?  ?CONSTITUTIONAL: Well-appearing, NAD ?NEURO/PSYCH:  Alert and oriented x 3, no focal deficits ?EYES:  eyes equal and reactive ?ENT/NECK:  no LAD, no JVD ?CARDIO: Regular rate, well-perfused, normal S1 and S2 ?PULM:  CTAB no wheezing or  rhonchi ?GI/GU:  non-distended, non-tender ?MSK/SPINE:  No gross deformities, no edema ?SKIN:  no rash, atraumatic ? ? ?*Additional and/or pertinent findings included in MDM below ? ?Diagnostic and Interventional Summary  ? ? EKG Interpretation ? ?Date/Time:    ?Ventricular Rate:    ?PR Interval:    ?QRS Duration:   ?QT Interval:    ?QTC Calculation:   ?R Axis:     ?Text Interpretation:   ?  ? ?  ? ?Labs Reviewed  ?BASIC METABOLIC PANEL - Abnormal; Notable for the following components:  ?    Result Value  ? Glucose, Bld 109 (*)   ? Creatinine, Ser 1.17 (*)   ? Calcium 8.4 (*)   ? GFR, Estimated 58 (*)   ? All other components within normal limits  ?CBC  ?LIPASE, BLOOD  ?I-STAT BETA HCG BLOOD, ED ([facility], [facility], [facility] ONLY)  ?  ?No orders to display  ?  ?Medications - No data to display  ? ?Procedures  /  Critical Care ?Procedures ? ?ED Course and Medical Decision Making  ?Initial Impression and Ddx ?Suspect benign process such as food poisoning or viral gastroenteritis.  Labs done during her initial ED visit during which she left without being seen were very reassuring, repeat labs today are unremarkable.  No significant leukocytosis or electrolyte disturbance.  On exam she has normal vital signs, sitting comfortably, abdomen completely soft and nontender, no rebound guarding or rigidity.  Nothing to suggest SBO or perforated viscus or appendicitis or any other emergent process.  Provided reassurance. ? ?Past medical/surgical history that increases complexity of ED encounter: None ? ?Interpretation of Diagnostics ?I personally reviewed the laboratory assessment and my interpretation is as follows: See above for my interpretation ?   ? ? ?Patient Reassessment and Ultimate Disposition/Management ?Discharge home ? ?Patient management required discussion with the following services or consulting groups:  None ? ?Complexity of Problems Addressed ?Acute illness or injury that poses threat of life of bodily function ? ?Additional  Data Reviewed and Analyzed ?Further history obtained from: ?Past medical history and medications listed in the EMR and Prior ED visit notes ? ?Additional Factors Impacting ED Encounter Risk ?Prescriptions ? ?Barth Kirks. Sedonia Small, MD ?[facility] Emergency Medicine ?[email or web address] ? ?Final Clinical Impressions(s) / ED Diagnoses  ? ?  ICD-10-CM   ?1. Generalized abdominal pain  R10.84   ?  ?  ?ED Discharge Orders   ? ?  Ordered  ?  dicyclomine (BENTYL) 20 MG tablet  2 times daily       ? 01/15/22 0045  ?  ondansetron (ZOFRAN-ODT) 4 MG disintegrating tablet  Every 8 hours PRN       ? 01/15/22 0045  ? ?  ?  ? ?  ?  ? ?Discharge Instructions Discussed with and Provided to Patient:  ? ? ?Discharge Instructions   ? ?  ?You were evaluated in the Emergency Department and after careful evaluation, we did not find any emergent condition requiring admission or further testing in the hospital. ? ?Your exam/testing today is overall reassuring.  Recommend use of the Zofran medication as needed for nausea, use of the Bentyl medication as needed for crampy abdominal pain.  If you continue to have less bowel movements than normal, recommend over-the-counter MiraLAX. ? ?Please return to the Emergency Department if you experience any worsening of your condition.   Thank you for allowing [place] to be a part of your care. ? ? ? ?  ?Maudie Flakes, MD ?01/15/22 [phone] ? ?

## 2022-01-15 NOTE — ED Notes (Signed)
Discharge instructions reviewed with patient. Patient verbalized understanding of instructions. Follow-up care and medications were reviewed. Patient ambulatory with steady gait. VSS upon discharge.  ?

## 2022-01-22 ENCOUNTER — Other Ambulatory Visit: Payer: Self-pay

## 2022-01-22 ENCOUNTER — Emergency Department (HOSPITAL_COMMUNITY)
Admission: EM | Admit: 2022-01-22 | Discharge: 2022-01-23 | Disposition: A | Discharge status: Home / Self Care | Payer: Managed Care, Other (non HMO) | Attending: Emergency Medicine | Admitting: Emergency Medicine

## 2022-01-22 ENCOUNTER — Encounter (HOSPITAL_COMMUNITY): Payer: Self-pay

## 2022-01-22 DIAGNOSIS — I10 Essential (primary) hypertension: Secondary | ICD-10-CM | POA: Diagnosis not present

## 2022-01-22 DIAGNOSIS — R1011 Right upper quadrant pain: Secondary | ICD-10-CM | POA: Diagnosis present

## 2022-01-22 DIAGNOSIS — Z79899 Other long term (current) drug therapy: Secondary | ICD-10-CM | POA: Insufficient documentation

## 2022-01-22 DIAGNOSIS — R110 Nausea: Secondary | ICD-10-CM | POA: Diagnosis not present

## 2022-01-22 LAB — COMPREHENSIVE METABOLIC PANEL
ALT: 16 U/L (ref 0–44)
AST: 17 U/L (ref 15–41)
Albumin: 4.2 g/dL (ref 3.5–5.0)
Alkaline Phosphatase: 60 U/L (ref 38–126)
Anion gap: 8 (ref 5–15)
BUN: 13 mg/dL (ref 6–20)
CO2: 25 mmol/L (ref 22–32)
Calcium: 8.8 mg/dL — ABNORMAL LOW (ref 8.9–10.3)
Chloride: 106 mmol/L (ref 98–111)
Creatinine, Ser: 1.09 mg/dL — ABNORMAL HIGH (ref 0.44–1.00)
GFR, Estimated: >60 mL/min (ref >60)
Glucose, Bld: 102 mg/dL — ABNORMAL HIGH (ref 70–99)
Potassium: 4 mmol/L (ref 3.5–5.1)
Sodium: 139 mmol/L (ref 135–145)
Total Bilirubin: 0.6 mg/dL (ref 0.3–1.2)
Total Protein: 7.7 g/dL (ref 6.5–8.1)

## 2022-01-22 LAB — CBC
HCT: 41.5 % (ref 36.0–46.0)
Hemoglobin: 14 g/dL (ref 12.0–15.0)
MCH: 30.9 pg (ref 26.0–34.0)
MCHC: 33.7 g/dL (ref 30.0–36.0)
MCV: 91.6 fL (ref 80.0–100.0)
Platelets: 269 10*3/uL (ref 150–400)
RBC: 4.53 MIL/uL (ref 3.87–5.11)
RDW: 12.3 % (ref 11.5–15.5)
WBC: 8 10*3/uL (ref 4.0–10.5)
nRBC: 0 % (ref 0.0–0.2)

## 2022-01-22 LAB — LIPASE, BLOOD: Lipase: 51 U/L (ref 11–51)

## 2022-01-22 LAB — I-STAT BETA HCG BLOOD, ED (MC, WL, AP ONLY): I-stat hCG, quantitative: <5 m[IU]/mL (ref <5)

## 2022-01-22 NOTE — ED Triage Notes (Signed)
RUQ abd pain x3 days. Pt states that she took "half of a lorazepam" right before coming in to the ED tonight.  ?

## 2022-01-23 ENCOUNTER — Emergency Department (HOSPITAL_COMMUNITY): Payer: Managed Care, Other (non HMO)

## 2022-01-23 LAB — URINALYSIS, MICROSCOPIC (REFLEX)

## 2022-01-23 LAB — URINALYSIS, ROUTINE W REFLEX MICROSCOPIC
Bilirubin Urine: NEGATIVE
Glucose, UA: NEGATIVE mg/dL
Ketones, ur: NEGATIVE mg/dL
Leukocytes,Ua: NEGATIVE
Nitrite: NEGATIVE
Protein, ur: NEGATIVE mg/dL
Specific Gravity, Urine: 1.02 (ref 1.005–1.030)
pH: 6 (ref 5.0–8.0)

## 2022-01-23 LAB — D-DIMER, QUANTITATIVE: D-Dimer, Quant: <0.27 ug/mL-FEU (ref 0.00–0.50)

## 2022-01-23 MED ORDER — KETOROLAC TROMETHAMINE 15 MG/ML IJ SOLN
15.0000 mg | Freq: Once | INTRAMUSCULAR | Status: AC
  Administered 2022-01-23: 15 mg via INTRAVENOUS
  Filled 2022-01-23: qty 1

## 2022-01-23 MED ORDER — CYCLOBENZAPRINE HCL 10 MG PO TABS: 10.0000 mg | ORAL_TABLET | Freq: Two times a day (BID) | ORAL | 0 refills | Status: DC | PRN

## 2022-01-23 NOTE — ED Provider Notes (Signed)
?[place] DEPT ?Provider Note ? ? ?CSN: [id number] ?Arrival date & time: 01/22/22  2153 ? ?  ? ?History ? ?Chief Complaint  ?Patient presents with  ? Abdominal Pain  ? ? ?Brooke Turner is a 46 y.o. female. ? ?The history is provided by the patient.  ?Abdominal Pain ?Brooke Turner is a 46 y.o. female who presents to the Emergency Department complaining of abdominal pain.  She presents to the ED for evaluation of RUQ abdominal pain that started abruptly on Thursday.  Pain overall was improved but still present Friday.  Saturday the pain continued to be present but she went to the gym and used the treadmill.  Pain has been considerably worse since the treadmill.  Pain is describes as spasms that are in the RUQ and radiate to the back.  Constant but worse with moving and breathing.  Has mild SOB.   ? ?No fever.  Has nausea, chills.   ? ?Last week had V/D.   ? ?Has HTN.  No hx/o DVT/PE.  Takes OCP.  No tobacco.  No lower extremity edema.   ? ?No prior surgeries.   ?  ? ?Home Medications ?Prior to Admission medications   ?Medication Sig Start Date End Date Taking? Authorizing Provider  ?acetaminophen (TYLENOL) 500 MG tablet Take 500 mg by mouth every 6 (six) hours as needed (back pain/ cramps).     [provider]  ?atenolol (TENORMIN) 25 MG tablet Take 1/2 tablet in the morning and 1/4 at bedtime 10/22/21   Minus Breeding, MD  ?blood glucose meter kit and supplies Test blood sugar twice daily. Dx code: R73.09 04/25/19   Maryruth Hancock, MD  ?budesonide-formoterol (SYMBICORT) 80-4.5 MCG/ACT inhaler Inhale 1-2 puffs into the lungs 2 (two) times daily. 12/10/21   Cobb, Karie Schwalbe, NP  ?cholecalciferol (VITAMIN D3) 25 MCG (1000 UNIT) tablet Take 1,000 Units by mouth daily.    [provider]  ?dexlansoprazole (DEXILANT) 60 MG capsule Take 1 capsule (60 mg total) by mouth every morning. 07/30/21   Thornton Park, MD  ?dicyclomine (BENTYL) 20 MG tablet Take 1  tablet (20 mg total) by mouth 2 (two) times daily. 01/15/22   Maudie Flakes, MD  ?ELDERBERRY PO Take 1 capsule by mouth daily.    [provider]  ?famotidine (PEPCID) 20 MG tablet Take 1 tablet (20 mg total) by mouth 2 (two) times daily. 07/30/21   Thornton Park, MD  ?ferrous sulfate 325 (65 FE) MG tablet Take 1 tablet (325 mg total) by mouth daily with breakfast. 09/10/21   Orson Slick, MD  ?fluticasone (FLONASE) 50 MCG/ACT nasal spray Place 2 sprays into both nostrils daily. ?Patient taking differently: Place 2 sprays into both nostrils as needed (seasonal allergies). 10/10/17   Tenna Delaine D, PA-C  ?glucose blood (ONETOUCH VERIO) test strip TEST BLOOD SUGAR DAILY 09/02/19   Forrest Moron, MD  ?levalbuterol [facility] HFA) 45 MCG/ACT inhaler Inhale 1-2 puffs every 6 hours if needed for shortness of breath or wheezing- rescue inhaler. Use 2 puffs 15 minutes prior to exercise. 12/10/21   Clayton Bibles, NP  ?LORazepam (ATIVAN) 0.5 MG tablet Take 0.5 tablets (0.25 mg total) by mouth 3 (three) times daily as needed for anxiety. 07/21/21   Thornton Park, MD  ?Multiple Vitamins-Minerals (MULTIVITAMIN GUMMIES ADULT PO) Take 1 tablet by mouth daily.    [provider]  ?Norethindrone-Ethinyl Estradiol-Fe Biphas (LO LOESTRIN FE) 1 MG-10 MCG / 10 MCG tablet Take 1 tablet  by mouth at bedtime.     [provider]  ?ondansetron (ZOFRAN-ODT) 4 MG disintegrating tablet Take 1 tablet (4 mg total) by mouth every 8 (eight) hours as needed for nausea or vomiting. 01/15/22   Maudie Flakes, MD  ?Jonetta Speak LANCETS 43P MISC USE TO TEST BLOOD SUGAR DAILY 11/16/18   [name], MD  ?pantoprazole (PROTONIX) 40 MG tablet Take 1 tablet (40 mg total) by mouth 2 (two) times daily. 12/15/21   Thornton Park, MD  ?progesterone (PROMETRIUM) 200 MG capsule Take 200 mg by mouth at bedtime. 12/04/21   [provider]  ?sucralfate (CARAFATE) 1 g tablet Dissolve 1 tablet in a small amount  of water to create a slurry and take po QID prn 09/21/21   Thornton Park, MD  ?vitamin B-12 (CYANOCOBALAMIN) 1000 MCG tablet Take 1,000 mcg by mouth daily.     [provider]  ?   ? ?Allergies    ?Penicillins, Tamiflu [oseltamivir phosphate], Tinidazole, and Diltiazem   ? ?Review of Systems   ?Review of Systems  ?Gastrointestinal:  Positive for abdominal pain.  ?All other systems reviewed and are negative. ? ?Physical Exam ?Updated Vital Signs ?BP (!) 189/97 (BP Location: Left Arm)   Pulse (!) 101   Temp 98.2 ?F (36.8 ?C) (Oral)   Resp 20   Ht 5' 4"  (1.626 m)   Wt 79.4 kg   SpO2 100%   BMI 30.04 kg/m?  ?Physical Exam ?Vitals and nursing note reviewed.  ?Constitutional:   ?   Appearance: She is well-developed.  ?HENT:  ?   Head: Normocephalic and atraumatic.  ?Cardiovascular:  ?   Rate and Rhythm: Normal rate and regular rhythm.  ?   Heart sounds: No murmur heard. ?Pulmonary:  ?   Effort: Pulmonary effort is normal. No respiratory distress.  ?   Breath sounds: Normal breath sounds.  ?   Comments: TTP over right lower axillary costal margin.  ?Abdominal:  ?   Palpations: Abdomen is soft.  ?   Tenderness: There is no abdominal tenderness. There is no guarding or rebound.  ?Musculoskeletal:     ?   General: No swelling or tenderness.  ?Skin: ?   General: Skin is warm and dry.  ?Neurological:  ?   Mental Status: She is alert and oriented to person, place, and time.  ?Psychiatric:     ?   Behavior: Behavior normal.  ? ? ?ED Results / Procedures / Treatments   ?Labs ?(all labs ordered are listed, but only abnormal results are displayed) ?Labs Reviewed  ?COMPREHENSIVE METABOLIC PANEL - Abnormal; Notable for the following components:  ?    Result Value  ? Glucose, Bld 102 (*)   ? Creatinine, Ser 1.09 (*)   ? Calcium 8.8 (*)   ? All other components within normal limits  ?LIPASE, BLOOD  ?CBC  ?URINALYSIS, ROUTINE W REFLEX MICROSCOPIC  ?I-STAT BETA HCG BLOOD, ED ([facility], [facility], [facility] ONLY)  ? ? ?EKG ?EKG  Interpretation ? ?Date/Time:  Saturday January 22 2022 22:17:14 EDT ?Ventricular Rate:  78 ?PR Interval:  152 ?QRS Duration: 92 ?QT Interval:  370 ?QTC Calculation: 422 ?R Axis:   20 ?Text Interpretation: Sinus rhythm Low voltage, precordial leads Confirmed by Quintella Reichert [phone]) on 01/23/2022 1:39:24 AM ? ?Radiology ?No results found. ? ?Procedures ?Procedures  ? ? ?Medications Ordered in ED ?Medications - No data to display ? ?ED Course/ Medical Decision Making/ A&P ?  ?                        ?  Medical Decision Making ?Amount and/or Complexity of Data Reviewed ?Labs: ordered. ?Radiology: ordered. ? ?Risk ?Prescription drug management. ? ? ?Patient here for evaluation of right-sided abdominal/flank pain, reproducible on evaluation.  CMP is within normal limits.  UA is not consistent with UTI or kidney stone.  D-dimer is negative, doubt PE.  No evidence of pneumonia.  Chest x-ray and ultrasound personally reviewed.  Agree with radiology interpretation.  Current clinical picture is not consistent obstructing stone, cholecystitis.  Discussed with patient home care for right-sided abdominal/flank pain.  Discussed this is likely musculoskeletal in nature.  Discussed home care with OTC analgesics.  She did have partial improvement in her symptoms with Toradol in the emergency department.  Will prescribe muscle relaxer that she can try as needed.  Discussed outpatient follow-up and return precautions. ? ? ? ? ? ? ?Final Clinical Impression(s) / ED Diagnoses ?Final diagnoses:  ?None  ? ? ?Rx / DC Orders ?ED Discharge Orders   ? ? None  ? ?  ? ? ?  ?Quintella Reichert, MD ?01/23/22 [phone] ? ?

## 2022-01-23 NOTE — Discharge Instructions (Addendum)
The cause of your symptoms was not identified today.  You can take tylenol or ibuprofen available over the counter according to label instructions as needed for pain.  Please follow up with your family doctor for recheck.  Get rechecked sooner if you have new or concerning symptoms.   ?

## 2022-01-25 ENCOUNTER — Other Ambulatory Visit: Payer: Self-pay | Admitting: Cardiology

## 2022-04-02 ENCOUNTER — Other Ambulatory Visit: Payer: Self-pay | Admitting: Cardiology

## 2022-04-12 ENCOUNTER — Inpatient Hospital Stay: Payer: Managed Care, Other (non HMO)

## 2022-04-12 ENCOUNTER — Other Ambulatory Visit: Payer: Self-pay | Admitting: Hematology and Oncology

## 2022-04-12 ENCOUNTER — Inpatient Hospital Stay: Payer: Managed Care, Other (non HMO) | Admitting: Hematology and Oncology

## 2022-04-12 DIAGNOSIS — D500 Iron deficiency anemia secondary to blood loss (chronic): Secondary | ICD-10-CM

## 2022-05-04 ENCOUNTER — Ambulatory Visit: Payer: Self-pay | Admitting: Internal Medicine

## 2022-06-08 ENCOUNTER — Emergency Department (HOSPITAL_COMMUNITY)
Admission: EM | Admit: 2022-06-08 | Discharge: 2022-06-08 | Disposition: A | Discharge status: Home / Self Care | Payer: Managed Care, Other (non HMO) | Attending: Emergency Medicine | Admitting: Emergency Medicine

## 2022-06-08 ENCOUNTER — Emergency Department (HOSPITAL_COMMUNITY): Payer: Managed Care, Other (non HMO)

## 2022-06-08 ENCOUNTER — Other Ambulatory Visit: Payer: Self-pay

## 2022-06-08 DIAGNOSIS — R0789 Other chest pain: Secondary | ICD-10-CM | POA: Diagnosis not present

## 2022-06-08 DIAGNOSIS — Z79899 Other long term (current) drug therapy: Secondary | ICD-10-CM | POA: Insufficient documentation

## 2022-06-08 DIAGNOSIS — J45909 Unspecified asthma, uncomplicated: Secondary | ICD-10-CM | POA: Insufficient documentation

## 2022-06-08 DIAGNOSIS — Z7951 Long term (current) use of inhaled steroids: Secondary | ICD-10-CM | POA: Insufficient documentation

## 2022-06-08 DIAGNOSIS — R109 Unspecified abdominal pain: Secondary | ICD-10-CM | POA: Diagnosis present

## 2022-06-08 DIAGNOSIS — I10 Essential (primary) hypertension: Secondary | ICD-10-CM | POA: Diagnosis not present

## 2022-06-08 DIAGNOSIS — R079 Chest pain, unspecified: Secondary | ICD-10-CM | POA: Insufficient documentation

## 2022-06-08 DIAGNOSIS — N946 Dysmenorrhea, unspecified: Secondary | ICD-10-CM | POA: Insufficient documentation

## 2022-06-08 DIAGNOSIS — N289 Disorder of kidney and ureter, unspecified: Secondary | ICD-10-CM | POA: Insufficient documentation

## 2022-06-08 LAB — CBC WITH DIFFERENTIAL/PLATELET
Abs Immature Granulocytes: 0.02 10*3/uL (ref 0.00–0.07)
Basophils Absolute: 0 10*3/uL (ref 0.0–0.1)
Basophils Relative: 0 %
Eosinophils Absolute: 0.1 10*3/uL (ref 0.0–0.5)
Eosinophils Relative: 1 %
HCT: 39.9 % (ref 36.0–46.0)
Hemoglobin: 13.8 g/dL (ref 12.0–15.0)
Immature Granulocytes: 0 %
Lymphocytes Relative: 56 %
Lymphs Abs: 4.8 10*3/uL — ABNORMAL HIGH (ref 0.7–4.0)
MCH: 31.9 pg (ref 26.0–34.0)
MCHC: 34.6 g/dL (ref 30.0–36.0)
MCV: 92.4 fL (ref 80.0–100.0)
Monocytes Absolute: 0.7 10*3/uL (ref 0.1–1.0)
Monocytes Relative: 8 %
Neutro Abs: 3 10*3/uL (ref 1.7–7.7)
Neutrophils Relative %: 35 %
Platelets: 252 10*3/uL (ref 150–400)
RBC: 4.32 MIL/uL (ref 3.87–5.11)
RDW: 12.4 % (ref 11.5–15.5)
WBC: 8.6 10*3/uL (ref 4.0–10.5)
nRBC: 0 % (ref 0.0–0.2)

## 2022-06-08 LAB — LIPASE, BLOOD: Lipase: 44 U/L (ref 11–51)

## 2022-06-08 LAB — URINALYSIS, ROUTINE W REFLEX MICROSCOPIC
Bacteria, UA: NONE SEEN
Bilirubin Urine: NEGATIVE
Glucose, UA: NEGATIVE mg/dL
Ketones, ur: NEGATIVE mg/dL
Leukocytes,Ua: NEGATIVE
Nitrite: NEGATIVE
Protein, ur: 100 mg/dL — AB
RBC / HPF: >50 RBC/hpf — ABNORMAL HIGH (ref 0–5)
Specific Gravity, Urine: 1.021 (ref 1.005–1.030)
pH: 5 (ref 5.0–8.0)

## 2022-06-08 LAB — COMPREHENSIVE METABOLIC PANEL
ALT: 17 U/L (ref 0–44)
AST: 23 U/L (ref 15–41)
Albumin: 3.8 g/dL (ref 3.5–5.0)
Alkaline Phosphatase: 53 U/L (ref 38–126)
Anion gap: 9 (ref 5–15)
BUN: 15 mg/dL (ref 6–20)
CO2: 22 mmol/L (ref 22–32)
Calcium: 8.7 mg/dL — ABNORMAL LOW (ref 8.9–10.3)
Chloride: 107 mmol/L (ref 98–111)
Creatinine, Ser: 1.15 mg/dL — ABNORMAL HIGH (ref 0.44–1.00)
GFR, Estimated: 59 mL/min — ABNORMAL LOW (ref >60)
Glucose, Bld: 135 mg/dL — ABNORMAL HIGH (ref 70–99)
Potassium: 4 mmol/L (ref 3.5–5.1)
Sodium: 138 mmol/L (ref 135–145)
Total Bilirubin: 0.7 mg/dL (ref 0.3–1.2)
Total Protein: 7.2 g/dL (ref 6.5–8.1)

## 2022-06-08 LAB — TROPONIN I (HIGH SENSITIVITY): Troponin I (High Sensitivity): 2 ng/L (ref <18)

## 2022-06-08 LAB — I-STAT BETA HCG BLOOD, ED (MC, WL, AP ONLY): I-stat hCG, quantitative: <5 m[IU]/mL (ref <5)

## 2022-06-08 LAB — D-DIMER, QUANTITATIVE: D-Dimer, Quant: 0.36 ug/mL-FEU (ref 0.00–0.50)

## 2022-06-08 MED ORDER — KETOROLAC TROMETHAMINE 10 MG PO TABS: 10.0000 mg | ORAL_TABLET | Freq: Four times a day (QID) | ORAL | 0 refills | Status: DC | PRN

## 2022-06-08 MED ORDER — KETOROLAC TROMETHAMINE 30 MG/ML IJ SOLN
30.0000 mg | Freq: Once | INTRAMUSCULAR | Status: AC
  Administered 2022-06-08: 30 mg via INTRAVENOUS
  Filled 2022-06-08: qty 1

## 2022-06-08 NOTE — ED Provider Notes (Signed)
[place] DEPT Provider Note   CSN: [id number] Arrival date & time: 06/08/22  0112     History  Chief Complaint  Patient presents with   Abdominal Pain   Chest Pain    Brooke Turner is a 46 y.o. female.  The history is provided by the patient.  Abdominal Pain Associated symptoms: chest pain   Chest Pain Associated symptoms: abdominal pain   She has history of hypertension, prediabetes, asthma and comes in because of left upper chest pain which started this evening.  She describes as a pain that is both sharp and dull and tends to come and go.  I will be present for about 1-2 minutes before resolving and then recur several minutes later.  Pain is worse with taking a deep breath.  She denies any dyspnea or diaphoresis but there has been mild nausea.  She also has had heavy menstrual flow with lower abdominal cramping today.  She has uterine fibroids and states that she had missed her last menses.  Menstrual flow today is slightly more than typical, and cramping is slightly worse than normal.  She had taken a dose of ibuprofen earlier in the day which did seem to give some temporary relief.  She also took a dose of acetaminophen tonight which also gave some temporary relief.  She is a non-smoker.  There is no history of hyperlipidemia.  There is no travel history no history of prior thromboembolic disease.   Home Medications Prior to Admission medications   Medication Sig Start Date End Date Taking? Authorizing Provider  acetaminophen (TYLENOL) 500 MG tablet Take 500 mg by mouth every 6 (six) hours as needed (back pain/ cramps).     [provider]  atenolol (TENORMIN) 25 MG tablet Take 1 tablet by mouth twice daily 04/05/22   Minus Breeding, MD  blood glucose meter kit and supplies Test blood sugar twice daily. Dx code: R73.09 04/25/19   Maryruth Hancock, MD  budesonide-formoterol [facility]) 80-4.5 MCG/ACT inhaler Inhale 1-2 puffs into the  lungs 2 (two) times daily. 12/10/21   Cobb, Karie Schwalbe, NP  cholecalciferol (VITAMIN D3) 25 MCG (1000 UNIT) tablet Take 1,000 Units by mouth daily.    [provider]  cyclobenzaprine (FLEXERIL) 10 MG tablet Take 1 tablet (10 mg total) by mouth 2 (two) times daily as needed for muscle spasms. 01/23/22   Quintella Reichert, MD  dicyclomine (BENTYL) 20 MG tablet Take 1 tablet (20 mg total) by mouth 2 (two) times daily. 01/15/22   Maudie Flakes, MD  ELDERBERRY PO Take 1 capsule by mouth daily.    [provider]  famotidine (PEPCID) 20 MG tablet Take 1 tablet (20 mg total) by mouth 2 (two) times daily. 07/30/21   Thornton Park, MD  ferrous sulfate 325 (65 FE) MG tablet Take 1 tablet (325 mg total) by mouth daily with breakfast. Patient taking differently: Take 325 mg by mouth every other day. 09/10/21   Orson Slick, MD  fluticasone (FLONASE) 50 MCG/ACT nasal spray Place 2 sprays into both nostrils daily. Patient not taking: Reported on 01/23/2022 10/10/17   Tenna Delaine D, PA-C  glucose blood [facility] VERIO) test strip TEST BLOOD SUGAR DAILY 09/02/19   Forrest Moron, MD  levalbuterol [facility] HFA) 45 MCG/ACT inhaler Inhale 1-2 puffs every 6 hours if needed for shortness of breath or wheezing- rescue inhaler. Use 2 puffs 15 minutes prior to exercise. Patient not taking: Reported on 01/23/2022 12/10/21  Cobb, Karie Schwalbe, NP  LORazepam (ATIVAN) 0.5 MG tablet Take 0.5 tablets (0.25 mg total) by mouth 3 (three) times daily as needed for anxiety. 07/21/21   Thornton Park, MD  Multiple Vitamins-Minerals (MULTIVITAMIN GUMMIES ADULT PO) Take 1 tablet by mouth daily.    [provider]  Norethindrone-Ethinyl Estradiol-Fe Biphas (LO LOESTRIN FE) 1 MG-10 MCG / 10 MCG tablet Take 1 tablet by mouth at bedtime.     [provider]  ondansetron (ZOFRAN-ODT) 4 MG disintegrating tablet Take 1 tablet (4 mg total) by mouth every 8 (eight) hours as needed for nausea or  vomiting. 01/15/22   Maudie Flakes, MD  [facility] DELICA LANCETS 69C MISC USE TO TEST BLOOD SUGAR DAILY 11/16/18   [name], MD  pantoprazole (PROTONIX) 40 MG tablet Take 1 tablet (40 mg total) by mouth 2 (two) times daily. 12/15/21   Thornton Park, MD  progesterone (PROMETRIUM) 200 MG capsule Take 200 mg by mouth at bedtime. 06/07/22   [provider]  sucralfate (CARAFATE) 1 g tablet Dissolve 1 tablet in a small amount of water to create a slurry and take po QID prn Patient not taking: Reported on 01/23/2022 09/21/21   Thornton Park, MD  vitamin B-12 (CYANOCOBALAMIN) 1000 MCG tablet Take 1,000 mcg by mouth daily.     [provider]      Allergies    Penicillins, Tamiflu [oseltamivir phosphate], Tinidazole, and Diltiazem    Review of Systems   Review of Systems  Cardiovascular:  Positive for chest pain.  Gastrointestinal:  Positive for abdominal pain.  All other systems reviewed and are negative.   Physical Exam Updated Vital Signs BP (!) 179/78 (BP Location: Left Arm)   Pulse 70   Temp 97.7 F (36.5 C) (Oral)   Resp 18   SpO2 100%  Physical Exam Vitals and nursing note reviewed.   46 year old female, resting comfortably and in no acute distress. Vital signs are significant for elevated blood pressure. Oxygen saturation is 100%, which is normal. Head is normocephalic and atraumatic. PERRLA, EOMI. Oropharynx is clear. Neck is nontender and supple without adenopathy or JVD. Back is nontender and there is no CVA tenderness. Lungs are clear without rales, wheezes, or rhonchi. Chest is mildly tender in the left upper chest wall.  There is no crepitus. Heart has regular rate and rhythm without murmur. Abdomen is soft, flat, nontender. Extremities have no cyanosis or edema, full range of motion is present. Skin is warm and dry without rash. Neurologic: Mental status is normal, cranial nerves are intact, moves all extremities equally.  ED Results /  Procedures / Treatments   Labs (all labs ordered are listed, but only abnormal results are displayed) Labs Reviewed  COMPREHENSIVE METABOLIC PANEL - Abnormal; Notable for the following components:      Result Value   Glucose, Bld 135 (*)    Creatinine, Ser 1.15 (*)    Calcium 8.7 (*)    GFR, Estimated 59 (*)    All other components within normal limits  URINALYSIS, ROUTINE W REFLEX MICROSCOPIC - Abnormal; Notable for the following components:   Color, Urine RED (*)    APPearance HAZY (*)    Hgb urine dipstick LARGE (*)    Protein, ur 100 (*)    RBC / HPF >50 (*)    All other components within normal limits  CBC WITH DIFFERENTIAL/PLATELET - Abnormal; Notable for the following components:   Lymphs Abs 4.8 (*)    All  other components within normal limits  LIPASE, BLOOD  D-DIMER, QUANTITATIVE  I-STAT BETA HCG BLOOD, ED ([facility], [facility], [facility] ONLY)  TROPONIN I (HIGH SENSITIVITY)   ED ECG REPORT   Date: 06/08/2022  Rate: 67  Rhythm: normal sinus rhythm  QRS Axis: normal  Intervals: normal  ST/T Wave abnormalities: normal  Conduction Disutrbances:none  Narrative Interpretation: Normal ECG.  When compared with ECG of 01/22/2022, no significant changes are seen.  Old EKG Reviewed: unchanged  I have personally reviewed the EKG tracing and agree with the computerized printout as noted. Radiology DG Chest 2 View  Result Date: 06/08/2022 CLINICAL DATA:  Sudden onset mild chest pain. EXAM: CHEST - 2 VIEW COMPARISON:  01/23/2022 FINDINGS: The heart size and mediastinal contours are within normal limits. Both lungs are clear. Mild degenerative changes in the thoracic spine. IMPRESSION: No active cardiopulmonary disease. Electronically Signed   By: Brett Fairy M.D.   On: 06/08/2022 03:04    Procedures Procedures    Medications Ordered in ED Medications  ketorolac (TORADOL) 30 MG/ML injection 30 mg (has no administration in time range)    ED Course/ Medical Decision Making/ A&P                            Medical Decision Making Amount and/or Complexity of Data Reviewed Labs: ordered. Radiology: ordered.  Risk Prescription drug management.   Chest pain which is somewhat atypical.  Strongly suspect musculoskeletal chest pain.  However, patient does has risk factors for ACS, pleuritic nature suspicious for possible pulmonary embolism.  Doubt pneumonia, pneumothorax, pericarditis, pleuritis.  I have reviewed and interpreted her ECG, and my interpretation is normal ECG and unchanged from prior.  I have reviewed and interpreted her laboratory tests and my interpretation is mild hyperglycemia consistent with prediabetes, mild renal insufficiency unchanged from baseline, normal CBC.  I have ordered troponin and D-dimer to rule out ACS and pulmonary embolism.  I have also ordered a chest x-ray to rule out pneumonia, pneumothorax.  Menstrual bleeding and cramping is not felt to be out of line with her known history of fibroids and further investigation is not done.  I have ordered a dose of ketorolac both for abdominal cramping and for her chest pain.  Old records are reviewed, and she does have several prior ED visits for chest pain including 03/16/2021, 01/11/2020, 12/13/2019, 12/01/2019, 08/21/2019, 08/16/2019, 05/22/2019, 04/21/2019.  She had an echocardiogram on 01/05/2021 which was normal.  She had a CT coronary angiogram on 06/18/2019 which showed normal coronary arteries.  With normal coronary arteries documented 3 years ago, ACS is very unlikely today.  Troponin is normal as is D-dimer.  Chest x-ray shows no acute cardiopulmonary disease.  I have independently viewed the images, and agree with radiologist's interpretation.  She had moderate to good relief of pain with ketorolac and is requesting a prescription for same.  She is discharged with prescription for ketorolac, but advised that it cannot be used for more than a week because of high risk of GI bleeding.  Advised to combine with acetaminophen as  needed to get additional pain relief, apply ice as needed as well for additional pain relief.  Return precautions discussed.  Final Clinical Impression(s) / ED Diagnoses Final diagnoses:  Musculoskeletal chest pain  Menstrual cramps  Renal insufficiency    Rx / DC Orders ED Discharge Orders          Ordered    ketorolac (TORADOL) 10  MG tablet  Every 6 hours PRN        06/08/22 1661              Delora Fuel, MD 96/94/09 [phone]

## 2022-06-08 NOTE — Discharge Instructions (Signed)
You may take ketorolac as often as every 6 hours as needed for pain.  If you need additional pain relief, add acetaminophen.  When you combine acetaminophen with ketorolac (or with ibuprofen or naproxen) you get better pain relief and you get from taking either medication by itself.  Apply ice to sore areas.  Ice to be applied for 30 minutes at a time, 4 times a day.  Return if you have any new or concerning symptoms.

## 2022-06-08 NOTE — ED Triage Notes (Signed)
Patient coming to ED for evaluation of lower abdominal pain and "cramping."  Reports "I started menstruating and I usually skip it because I have fibroids.  The bleeding started out light and today it is really bad."  C/o severe abdominal pain and started she had mild chest pain to L chest and indigestion last night.

## 2022-06-17 ENCOUNTER — Ambulatory Visit: Payer: Managed Care, Other (non HMO) | Admitting: Internal Medicine

## 2022-07-28 ENCOUNTER — Ambulatory Visit: Payer: Managed Care, Other (non HMO) | Admitting: Internal Medicine

## 2022-08-25 ENCOUNTER — Telehealth: Payer: Self-pay | Admitting: Cardiology

## 2022-08-25 NOTE — Telephone Encounter (Signed)
Spoke with patient of Dr. Percival Spanish who reports chest pain on and off, x1 week. This is an "extreme sharp pain" that lasts a few seconds and then goes away - said this is "shocking" and "hurts".  She reports the sharp pain into her arm and also some aching that will persist. When she wakes up in the AM she is very sore. She reports she has not been "working her muscles". She would like to be evaluated. Last visit with Dr. Percival Spanish 10/2021  Per chart review, she saw Talbot Grumbling MD (cardiologist with [facility]) in June 2023  She said she would prefer to see Dr. Percival Spanish  Offered 8am with Arnold Long DNP which she first declined but then decided to accept. Scheduled for patient.

## 2022-08-25 NOTE — Telephone Encounter (Signed)
Pt c/o of Chest Pain: STAT if CP now or developed within 24 hours  1. Are you having CP right now? Yes  2. Are you experiencing any other symptoms (ex. SOB, nausea, vomiting, sweating)? No   3. How long have you been experiencing CP? A little over a week   4. Is your CP continuous or coming and going? Comes and goes   5. Have you taken Nitroglycerin? No  ?

## 2022-08-26 ENCOUNTER — Telehealth: Payer: Self-pay | Admitting: Cardiology

## 2022-08-26 ENCOUNTER — Ambulatory Visit: Payer: Managed Care, Other (non HMO) | Attending: Adult Health | Admitting: Adult Health

## 2022-08-26 ENCOUNTER — Encounter: Payer: Self-pay | Admitting: Adult Health

## 2022-08-26 VITALS — BP 152/88 | HR 69 | Ht 63.5 in | Wt 198.0 lb

## 2022-08-26 DIAGNOSIS — F418 Other specified anxiety disorders: Secondary | ICD-10-CM

## 2022-08-26 DIAGNOSIS — I10 Essential (primary) hypertension: Secondary | ICD-10-CM | POA: Diagnosis not present

## 2022-08-26 DIAGNOSIS — R079 Chest pain, unspecified: Secondary | ICD-10-CM

## 2022-08-26 NOTE — Telephone Encounter (Signed)
LMTCB

## 2022-08-26 NOTE — Progress Notes (Signed)
Cardiology Clinic Note   Patient Name: Brooke Turner Date of Encounter: 08/26/2022  Primary Care Provider:  Fanny Bien, MD Primary Cardiologist:  Minus Breeding, MD  Patient Profile    46 year old patient with history of hypertension, chronic palpitations GERD, anxiety, hiatal hernia, and non-cardiac chest pain. Coronary CTA 06/18/2019 was normal without evidence of CAD. Normal echocardiogram 01/05/2021.   Past Medical History    Past Medical History:  Diagnosis Date   Anxiety    Back pain    Chest pain    a. 09/2014 Echo: EF 60-65%, no rwma, nl valves;  b. 12/2014 Neg ETT.   Essential hypertension    a. 01/2016 Renal duplex: no RAS- ? renal cyst (renal u/s 4/17 - no cyst, nl study); b. 02/2016 24 hr BP Monitor: Mean SBP ~ 138 with Max of 198. Highest pressures noted in evening hours following placement of cuff.   Fibroid uterus    GERD (gastroesophageal reflux disease)    Hiatal hernia    Palpitations    a. 09/2014 nl event monitor.   Past Surgical History:  Procedure Laterality Date   26 HOUR [place] STUDY N/A 09/01/2021   Procedure: [place] STUDY;  Surgeon: Thornton Park, MD;  Location: [facility];  Service: Gastroenterology;  Laterality: N/A;   ESOPHAGEAL MANOMETRY N/A 09/01/2021   Procedure: ESOPHAGEAL MANOMETRY (EM);  Surgeon: Thornton Park, MD;  Location: [facility];  Service: Gastroenterology;  Laterality: N/A;   ESOPHAGOGASTRODUODENOSCOPY  01/2020   WISDOM TOOTH EXTRACTION      Allergies  Allergies  Allergen Reactions   Penicillins Hives    Has patient had a PCN reaction causing immediate rash, facial/tongue/throat swelling, SOB or lightheadedness with hypotension:Yes Has patient had a PCN reaction causing severe rash involving mucus membranes or skin necrosis:unsure Has patient had a PCN reaction that required hospitalization: Yes Has patient had a PCN reaction occurring within the last 10 years: No If all of the above answers are "NO",  then may proceed with Cephalosporin use.      Tamiflu [Oseltamivir Phosphate] Nausea And Vomiting    Excessive vomiting   Tinidazole Other (See Comments)    Caused severe abd pain/type of colitis    Diltiazem Other (See Comments)    Dizzy, felt like she was going to pass out Blood pressure went up per patient     History of Present Illness    Mrs. Brooke Turner is a pleasant 46 year old female who presents today for ongoing complaints of chest pain. Describes as extreme sharp pain lasting a few seconds, "shocking" pain radiating into the arm with achy feeling. She as been seen in the ED in August for similar pain, cramping pain in abdomen and chest pain. Ruled out for ACS.  She does have significant worries about this recurrence because she has a strong family history of heart disease.  She states that pain occurs daily and has been over the last week but has had been having it intermittently over the last 3 weeks.  She is trying to lose weight and exercise more but has been afraid to move forward with exercise because of the discomfort.  She denies use of caffeine, carbonated soft drinks, or gas producing foods.  She states that this occurs mostly while sitting.  She works at home and works on a computer most of the day.  Home Medications    Current Outpatient Medications  Medication Sig Dispense Refill   acetaminophen (TYLENOL) 500 MG tablet Take 500 mg  by mouth every 6 (six) hours as needed (back pain/ cramps).      atenolol (TENORMIN) 25 MG tablet Take 1 tablet by mouth twice daily 180 tablet 2   blood glucose meter kit and supplies Test blood sugar twice daily. Dx code: R73.09 1 each 0   budesonide-formoterol (SYMBICORT) 80-4.5 MCG/ACT inhaler Inhale 1-2 puffs into the lungs 2 (two) times daily. 1 each 3   cholecalciferol (VITAMIN D3) 25 MCG (1000 UNIT) tablet Take 1,000 Units by mouth daily.     dicyclomine (BENTYL) 20 MG tablet Take 1 tablet (20 mg total) by mouth 2 (two) times  daily. 20 tablet 0   ELDERBERRY PO Take 1 capsule by mouth daily.     famotidine (PEPCID) 20 MG tablet Take 1 tablet (20 mg total) by mouth 2 (two) times daily. 180 tablet 0   ferrous sulfate 325 (65 FE) MG tablet Take 1 tablet (325 mg total) by mouth daily with breakfast. (Patient taking differently: Take 325 mg by mouth every other day.) 90 tablet 1   glucose blood (ONETOUCH VERIO) test strip TEST BLOOD SUGAR DAILY 25 each 10   ketorolac (TORADOL) 10 MG tablet Take 1 tablet (10 mg total) by mouth every 6 (six) hours as needed. 20 tablet 0   levalbuterol (XOPENEX HFA) 45 MCG/ACT inhaler Inhale 1-2 puffs every 6 hours if needed for shortness of breath or wheezing- rescue inhaler. Use 2 puffs 15 minutes prior to exercise. 15 g 0   LORazepam (ATIVAN) 0.5 MG tablet Take 0.5 tablets (0.25 mg total) by mouth 3 (three) times daily as needed for anxiety.     Multiple Vitamins-Minerals (MULTIVITAMIN GUMMIES ADULT PO) Take 1 tablet by mouth daily.     Norethindrone-Ethinyl Estradiol-Fe Biphas (LO LOESTRIN FE) 1 MG-10 MCG / 10 MCG tablet Take 1 tablet by mouth at bedtime.      ONETOUCH DELICA LANCETS 16X MISC USE TO TEST BLOOD SUGAR DAILY 100 each 3   pantoprazole (PROTONIX) 40 MG tablet Take 1 tablet (40 mg total) by mouth 2 (two) times daily. 180 tablet 3   sucralfate (CARAFATE) 1 g tablet Dissolve 1 tablet in a small amount of water to create a slurry and take po QID prn 120 tablet 3   vitamin B-12 (CYANOCOBALAMIN) 1000 MCG tablet Take 1,000 mcg by mouth daily.      No current facility-administered medications for this visit.     Family History    Family History  Problem Relation Age of Onset   Seizures Mother    Epilepsy Mother    GER disease Mother    Cancer Father        Liver   Diabetes Father 23   Hypertension Father    Heart disease Father 59       CEA, LE [place]   Alzheimer's disease Maternal Grandmother    Heart disease Maternal Grandfather    Colon cancer Neg Hx    Breast cancer  Neg Hx    She indicated that her mother is alive. She indicated that her father is deceased. She indicated that her sister is alive. She indicated that her brother is alive. She indicated that her maternal grandmother is deceased. She indicated that her maternal grandfather is deceased. She indicated that her paternal grandmother is deceased. She indicated that her paternal grandfather is deceased. She indicated that the status of her neg hx is unknown.  Social History    Social History   Socioeconomic History   Marital status: Single  Spouse name: N/A   Number of children: 0   Years of education: 64   Highest education level: Not on file  Occupational History   Occupation: Customer Service    Employer: [employer]  Tobacco Use   Smoking status: Former    Packs/day: 0.25    Years: 12.00    Total pack years: 3.00    Types: Cigarettes    Start date: 1999    Quit date: 2011    Years since quitting: 12.8   Smokeless tobacco: Never  Vaping Use   Vaping Use: Never used  Substance and Sexual Activity   Alcohol use: No    Alcohol/week: 0.0 standard drinks of alcohol   Drug use: No   Sexual activity: Not Currently    Partners: Male    Birth control/protection: Pill  Other Topics Concern   Not on file  Social History Narrative   Patient lives at home alone .   Patient works full time at [employer]   Education college.   Right handed.   Caffeine none   Social Determinants of Health   Financial Resource Strain: Not on file  Food Insecurity: Not on file  Transportation Needs: Not on file  Physical Activity: Not on file  Stress: Not on file  Social Connections: Not on file  Intimate Partner Violence: Not on file     Review of Systems    General:  No chills, fever, night sweats or weight changes.  Cardiovascular: Positive for chest pain, no dyspnea on exertion, edema, orthopnea, palpitations, paroxysmal nocturnal dyspnea. Dermatological: No rash,  lesions/masses Respiratory: No cough, dyspnea Urologic: No hematuria, dysuria Abdominal:   No nausea, vomiting, diarrhea, bright red blood per rectum, melena, or hematemesis Neurologic:  No visual changes, wkns, changes in mental status. All other systems reviewed and are otherwise negative except as noted above.     Physical Exam    VS:  BP (!) 152/88   Pulse 69   Ht 5' 3.5" (1.613 m)   Wt 198 lb (89.8 kg)   SpO2 98%   BMI 34.52 kg/m  , BMI Body mass index is 34.52 kg/m.     GEN: Well nourished, well developed, in no acute distress. HEENT: normal. Neck: Supple, no JVD, carotid bruits, or masses. Cardiac: RRR, no murmurs, rubs, or gallops. No clubbing, cyanosis, edema.  Radials/DP/PT 2+ and equal bilaterally.  Respiratory:  Respirations regular and unlabored, clear to auscultation bilaterally. GI: Soft, nontender, nondistended, BS + x 4. MS: no deformity or atrophy. Skin: warm and dry, no rash. Neuro:  Strength and sensation are intact. Psych: Normal affect.  Accessory Clinical Findings    ECG personally reviewed by me today not completed today.  Lab Results  Component Value Date   WBC 8.6 06/08/2022   HGB 13.8 06/08/2022   HCT 39.9 06/08/2022   MCV 92.4 06/08/2022   PLT 252 06/08/2022   Lab Results  Component Value Date   CREATININE 1.15 (H) 06/08/2022   BUN 15 06/08/2022   NA 138 06/08/2022   K 4.0 06/08/2022   CL 107 06/08/2022   CO2 22 06/08/2022   Lab Results  Component Value Date   ALT 17 06/08/2022   AST 23 06/08/2022   ALKPHOS 53 06/08/2022   BILITOT 0.7 06/08/2022   Lab Results  Component Value Date   CHOL 169 08/25/2020   HDL 51 08/25/2020   LDLCALC 102 (H) 08/25/2020   LDLDIRECT 101 (H) 10/25/2016   TRIG 86 08/25/2020  CHOLHDL 3.3 08/25/2020    Lab Results  Component Value Date   HGBA1C 5.6 04/24/2019    Review of Prior Studies: Coronary CTA 2020 IMPRESSION: 1. Coronary calcium score of 0. This was 0 percentile for age and sex  matched control.   2. Normal coronary origin with right dominance.   3. No evidence of CAD in the LAD or LCx. No CAD in the visualized portions of the RCA but cannot comment on the PDA or PL which were poorly visualized due to motion artifact.   Echocardiogram 04/10/2019 . The left ventricle has normal systolic function with an ejection  fraction of 60-65%. The cavity size was normal. Left ventricular diastolic  Doppler parameters are consistent with impaired relaxation. No evidence of  left ventricular regional wall  motion abnormalities.   2. The right ventricle has normal systolic function. The cavity was  normal. There is no increase in right ventricular wall thickness.   3. No evidence of mitral valve stenosis. No significant regurgitation.   4. The aortic valve is tricuspid. No stenosis of the aortic valve.   5. The aortic root is normal in size and structure.   6. Normal IVC size. No complete TR doppler jet so unable to estimate PA  systolic pressure.     Assessment & Plan   1.  Recurrent chest pain: Noncardiac in etiology.  Reassurance is given concerning explanation of most recent cardiac evaluation to include normal coronaries per cardiac CTA, normal echocardiogram.  I have advised her over 1 to 2-year period of time she would not have significant coronary artery disease manifestation.  I have encouraged her to exercise, and continue healthy lifestyle.  No further cardiac work-up is recommended.  2.  Musculoskeletal pain: I feel like some of the pain is most likely from cervical or thoracic spinal etiology.  It is described as sharp, transient, with some radiculopathy into the left axilla and underarm.  The patient sits a lot and this may be postural.  She is advised to follow-up with her PCP for further x-rays to identify if this is accurate differential.  3.  Hypertension: Blood pressure is elevated today.  I did recheck it and was found to be 160/88.  She has not yet taken her  medications this morning prior to coming in.  This includes atenolol 25 mg twice daily.  She is advised to take her medications prior to follow-up appointment which she has with her PCP today.  4.  Anxiety over health: She does have significant worries about her cardiovascular health based upon family members who have cardiovascular issues and family history of CAD.  I have offered counseling to help her to cope with these legitimate worries.  Elias Else, Psych D, is available and I am happy to refer her.  She states that she will let us know if this is something she wishes to move forward with in the future.  Current medicines are reviewed at length with the patient today.  I have spent 25 min's  dedicated to the care of this patient on the date of this encounter to include pre-visit review of records, assessment, management and diagnostic testing,with shared decision making.  Signed, Phill Myron. West Pugh, ANP, AACC   08/26/2022 9:59 AM      Office [phone] Fax [phone]  Notice: This dictation was prepared with Dragon dictation along with smaller phrase technology. Any transcriptional errors that result from this process are unintentional and may not be corrected upon review.

## 2022-08-26 NOTE — Telephone Encounter (Signed)
Patient had appointment this AM  at [facility]. She wanted to know why she didn't have an EKG with her chest pain. Offered to speak with practitioner, but patient stated not to worry about it, that she has an appointment with PCP later today and will ask for EKG there.

## 2022-08-26 NOTE — Patient Instructions (Signed)
Medication Instructions:  No Changes *If you need a refill on your cardiac medications before your next appointment, please call your pharmacy*   Lab Work: No Labs If you have labs (blood work) drawn today and your tests are completely normal, you will receive your results only by: [place] (if you have MyChart) OR A paper copy in the mail If you have any lab test that is abnormal or we need to change your treatment, we will call you to review the results.   Testing/Procedures: No Testing   Follow-Up: At [facility], you and your health needs are our priority.  As part of our continuing mission to provide you with exceptional heart care, we have created designated Provider Care Teams.  These Care Teams include your primary Cardiologist (physician) and Advanced Practice Providers (APPs -  Physician Assistants and Nurse Practitioners) who all work together to provide you with the care you need, when you need it.  We recommend signing up for the patient portal called "MyChart".  Sign up information is provided on this After Visit Summary.  MyChart is used to connect with patients for Virtual Visits (Telemedicine).  Patients are able to view lab/test results, encounter notes, upcoming appointments, etc.  Non-urgent messages can be sent to your provider as well.   To learn more about what you can do with MyChart, go to [email or web address].    Your next appointment:   As Needed  The format for your next appointment:   In Person  Provider:   Minus Breeding, MD

## 2022-08-26 NOTE — Telephone Encounter (Signed)
Patient is calling to talk with Dr. Hochrein or nurse. Please call back 

## 2022-09-12 NOTE — Telephone Encounter (Signed)
Telephone call  

## 2022-09-26 ENCOUNTER — Telehealth: Payer: Self-pay | Admitting: Gastroenterology

## 2022-09-26 NOTE — Telephone Encounter (Signed)
Patient wanting to schedule for colon procedure. Please advise.

## 2022-09-28 ENCOUNTER — Encounter: Payer: Self-pay | Admitting: Gastroenterology

## 2022-09-28 NOTE — Telephone Encounter (Signed)
Please see note below ok to schedule colon

## 2022-10-21 ENCOUNTER — Other Ambulatory Visit: Payer: Self-pay | Admitting: Family Medicine

## 2022-10-21 DIAGNOSIS — Z1231 Encounter for screening mammogram for malignant neoplasm of breast: Secondary | ICD-10-CM

## 2022-10-24 ENCOUNTER — Telehealth: Payer: Self-pay

## 2022-10-24 ENCOUNTER — Ambulatory Visit
Admission: RE | Admit: 2022-10-24 | Discharge: 2022-10-24 | Disposition: A | Discharge status: Home / Self Care | Payer: Managed Care, Other (non HMO) | Source: Ambulatory Visit | Attending: Family Medicine | Admitting: Family Medicine

## 2022-10-24 ENCOUNTER — Ambulatory Visit: Payer: Managed Care, Other (non HMO)

## 2022-10-24 DIAGNOSIS — Z1231 Encounter for screening mammogram for malignant neoplasm of breast: Secondary | ICD-10-CM

## 2022-10-24 NOTE — Telephone Encounter (Signed)
Patient is scheduled for colon in [place] on 11/22/22 with Dr. Tarri Glenn & per pre-visit echo from 01/2021 is questionable for [place] d/t documentation stating "moderate to severe aortic valve stenosis" and would like to know if this should be a hospital case. Will route to MD & CRNA.

## 2022-10-24 NOTE — Telephone Encounter (Signed)
[facility] Pre-operative Risk Assessment     Request for surgical clearance:     Endoscopy Procedure  What type of surgery is being performed?     Colonoscopy  When is this surgery scheduled?     12/06/22  What type of clearance is required ?   Cardiac   Are there any medications that need to be held prior to surgery and how long?   Practice name and name of physician performing surgery?      [facility]  What is your office phone and fax number?      Phone- [phone]  [phone]  Anesthesia type (None, local, MAC, general) ?       MAC

## 2022-10-25 NOTE — Telephone Encounter (Signed)
   Name: Brooke Turner  DOB: [birth year]  MRN: [id number]  Primary Cardiologist: Minus Breeding, MD   Preoperative team, please contact this patient and set up a phone call appointment for further preoperative risk assessment. Please obtain consent and complete medication review. Thank you for your help.  I confirm that guidance regarding antiplatelet and oral anticoagulation therapy has been completed and, if necessary, noted below (none requested).    Lenna Sciara, NP 10/25/2022, 12:36 PM [place]

## 2022-10-26 NOTE — Telephone Encounter (Signed)
Left message to call back for a tele pre op appt

## 2022-10-27 NOTE — Telephone Encounter (Signed)
2nd attempt to reach pt regarding surgical clearance and the need for a tele visit.  Left another message for pt to call back.

## 2022-10-28 ENCOUNTER — Telehealth: Payer: Self-pay | Admitting: *Deleted

## 2022-10-28 NOTE — Telephone Encounter (Signed)
I s/w the pt about tele pre op appt. Pt tells me that she is most likely going to be changing provider for her colonoscopy and go with [facility].I did tell the pt that I will need a clearance request to come over from their office. Pt said just has to call them and confirm that she is going with them so they can schedule her procedure. Pt chose to still schedule tele pre op appt for next week as she will be off next week. I urged the importance of making sure [facility] faxes over a clearance to our office ASAP if possible today to fax # [phone] or [phone] so that we may have their clearance ready for review with the pre op APP during tele appt. Pt said she will call them today and tell them to fax over clearance request.

## 2022-10-28 NOTE — Telephone Encounter (Signed)
I s/w the pt about tele pre op appt. Pt tells me that she is most likely going to be changing provider for her colonoscopy and go with [facility].I did tell the pt that I will need a clearance request to come over from their office. Pt said just has to call them and confirm that she is going with them so they can schedule her procedure. Pt chose to still schedule tele pre op appt for next week as she will be off next week. I urged the importance of making sure [facility] faxes over a clearance to our office ASAP if possible today to fax # [phone] or [phone] so that we may have their clearance ready for review with the pre op APP during tele appt. Pt said she will call them today and tell them to fax over clearance request.        SEE NOTES ABOVE PT IS CHANGING PRACTICES FOR HER COLONOSCOPY; SHE WILL BE HAVING DONE WITH [facility]; SEE NOTES ABOVE.

## 2022-10-29 ENCOUNTER — Other Ambulatory Visit: Payer: Self-pay

## 2022-10-29 ENCOUNTER — Encounter (HOSPITAL_COMMUNITY): Payer: Self-pay

## 2022-10-29 ENCOUNTER — Emergency Department (HOSPITAL_COMMUNITY)
Admission: EM | Admit: 2022-10-29 | Discharge: 2022-10-30 | Disposition: A | Discharge status: Home / Self Care | Payer: Managed Care, Other (non HMO) | Attending: Emergency Medicine | Admitting: Emergency Medicine

## 2022-10-29 DIAGNOSIS — K6289 Other specified diseases of anus and rectum: Secondary | ICD-10-CM | POA: Diagnosis present

## 2022-10-29 LAB — CBC WITH DIFFERENTIAL/PLATELET
Abs Immature Granulocytes: 0.01 10*3/uL (ref 0.00–0.07)
Basophils Absolute: 0 10*3/uL (ref 0.0–0.1)
Basophils Relative: 0 %
Eosinophils Absolute: 0 10*3/uL (ref 0.0–0.5)
Eosinophils Relative: 0 %
HCT: 39.3 % (ref 36.0–46.0)
Hemoglobin: 13.3 g/dL (ref 12.0–15.0)
Immature Granulocytes: 0 %
Lymphocytes Relative: 56 %
Lymphs Abs: 4.2 10*3/uL — ABNORMAL HIGH (ref 0.7–4.0)
MCH: 31.4 pg (ref 26.0–34.0)
MCHC: 33.8 g/dL (ref 30.0–36.0)
MCV: 92.9 fL (ref 80.0–100.0)
Monocytes Absolute: 0.7 10*3/uL (ref 0.1–1.0)
Monocytes Relative: 10 %
Neutro Abs: 2.5 10*3/uL (ref 1.7–7.7)
Neutrophils Relative %: 34 %
Platelets: 240 10*3/uL (ref 150–400)
RBC: 4.23 MIL/uL (ref 3.87–5.11)
RDW: 12.4 % (ref 11.5–15.5)
WBC: 7.6 10*3/uL (ref 4.0–10.5)
nRBC: 0 % (ref 0.0–0.2)

## 2022-10-29 LAB — PREGNANCY, URINE: Preg Test, Ur: NEGATIVE

## 2022-10-29 NOTE — ED Triage Notes (Signed)
Sts battle with internal hemorrhoids. Has seen PCP several times and was started on steroid suppositories since thanksgiving.   Several days ago pain returned. No hemorrhoids visualized last visit. Encouraged to ER by PCP for r/o abscess.

## 2022-10-29 NOTE — ED Provider Triage Note (Signed)
Emergency Medicine Provider Triage Evaluation Note  Brooke Turner , a 46 y.o. female  was evaluated in triage.  Patient complains of rectal pain.  Has been dealing with external hemorrhoids for the past couple of weeks.  She was recently saw her doctor a week ago who said to continue with the steroid cream and suppositories.  PCP suggested that she come to the emergency department due to these recurring pains to be evaluated for an abscess?  No fevers, chills, still having bowel movements, no melena or hematochezia Review of Systems  Positive:  Negative:   Physical Exam  BP (!) 184/94   Pulse 73   Temp 98.2 F (36.8 C)   Resp 18   Ht '5\' 3"'$  (1.6 m)   Wt 88 kg   LMP  (LMP Unknown)   SpO2 100%   BMI 34.37 kg/m  Gen:   Awake, no distress   Resp:  Normal effort  MSK:   Moves extremities without difficulty  Other:  Rectal not done in triage.   Medical Decision Making  Medically screening exam initiated at 11:37 PM.  Appropriate orders placed.  Cornelious E Felice-Jennings was informed that the remainder of the evaluation will be completed by another provider, this initial triage assessment does not replace that evaluation, and the importance of remaining in the ED until their evaluation is complete.  Labs and CT ordered due to PCP request..  She was scheduled for colonoscopy with [place] but changed to equal and is awaiting clearance for colonoscopy/endoscopy.   Rhae Hammock, PA-C 10/29/22 2340

## 2022-10-30 ENCOUNTER — Encounter (HOSPITAL_COMMUNITY): Payer: Self-pay

## 2022-10-30 ENCOUNTER — Emergency Department (HOSPITAL_COMMUNITY): Payer: Managed Care, Other (non HMO)

## 2022-10-30 LAB — URINALYSIS, ROUTINE W REFLEX MICROSCOPIC
Bilirubin Urine: NEGATIVE
Glucose, UA: NEGATIVE mg/dL
Ketones, ur: NEGATIVE mg/dL
Leukocytes,Ua: NEGATIVE
Nitrite: NEGATIVE
Protein, ur: NEGATIVE mg/dL
Specific Gravity, Urine: 1.02 (ref 1.005–1.030)
pH: 5 (ref 5.0–8.0)

## 2022-10-30 LAB — COMPREHENSIVE METABOLIC PANEL
ALT: 20 U/L (ref 0–44)
AST: 25 U/L (ref 15–41)
Albumin: 4 g/dL (ref 3.5–5.0)
Alkaline Phosphatase: 52 U/L (ref 38–126)
Anion gap: 7 (ref 5–15)
BUN: 11 mg/dL (ref 6–20)
CO2: 24 mmol/L (ref 22–32)
Calcium: 8.6 mg/dL — ABNORMAL LOW (ref 8.9–10.3)
Chloride: 107 mmol/L (ref 98–111)
Creatinine, Ser: 1.08 mg/dL — ABNORMAL HIGH (ref 0.44–1.00)
GFR, Estimated: >60 mL/min (ref >60)
Glucose, Bld: 90 mg/dL (ref 70–99)
Potassium: 3.5 mmol/L (ref 3.5–5.1)
Sodium: 138 mmol/L (ref 135–145)
Total Bilirubin: 1 mg/dL (ref 0.3–1.2)
Total Protein: 7.3 g/dL (ref 6.5–8.1)

## 2022-10-30 LAB — LIPASE, BLOOD: Lipase: 39 U/L (ref 11–51)

## 2022-10-30 MED ORDER — LIDOCAINE HCL URETHRAL/MUCOSAL 2 % EX GEL
1.0000 | Freq: Once | CUTANEOUS | Status: AC
  Administered 2022-10-30: 1 via TOPICAL
  Filled 2022-10-30: qty 11

## 2022-10-30 MED ORDER — IOHEXOL 300 MG/ML  SOLN
100.0000 mL | Freq: Once | INTRAMUSCULAR | Status: AC | PRN
  Administered 2022-10-30: 100 mL via INTRAVENOUS

## 2022-10-30 NOTE — ED Provider Notes (Signed)
[facility] DEPT Provider Note   CSN: [id number] Arrival date & time: 10/29/22  2245     History  Chief Complaint  Patient presents with   Rectal Pain    Brooke Turner is a 46 y.o. female.  The history is provided by the patient.  Brooke Turner is a 46 y.o. female who presents to the Emergency Department complaining of rectal pain.  She presents to the emergency department for evaluation of rectal pain that started about a week before Thanksgiving with associated discomfort and blood in her stool.  She saw her PCP and took a suppository for 2 weeks, with improvement in her symptoms but then she started to develop worsening discomfort again.  She feels this discomfort with sitting, bending and walking.  She has nausea but no vomiting.  No abdominal pain.  She did have 1 episode of diarrhea this evening.  She is having soft stools but feels like this is less frequent than her baseline, approximately every other day instead of once to twice daily.  Has a history of hypertension, no additional medical issues. When her pain began to return she was started on an increased dose of her suppository.  She does have a gastroenterologist.    Home Medications Prior to Admission medications   Medication Sig Start Date End Date Taking? Authorizing Provider  acetaminophen (TYLENOL) 500 MG tablet Take 500 mg by mouth every 6 (six) hours as needed (back pain/ cramps).    Yes [provider]  atenolol (TENORMIN) 25 MG tablet Take 1 tablet by mouth twice daily 04/05/22  Yes Hochrein, Jeneen Rinks, MD  Azelastine HCl 137 MCG/SPRAY SOLN Place 1 spray into both nostrils 2 (two) times daily as needed (allergies).   Yes [provider]  azithromycin (ZITHROMAX) 250 MG tablet Take 250-500 mg by mouth as directed. Take 2 tablets on Day 1 Then Take 1 tablet for 4 Days 10/26/22  Yes [provider]  cholecalciferol (VITAMIN D3) 25 MCG (1000 UNIT)  tablet Take 1,000 Units by mouth daily.   Yes [provider]  docusate sodium (COLACE) 50 MG capsule Take 50 mg by mouth daily.   Yes [provider]  famotidine (PEPCID) 20 MG tablet Take 1 tablet (20 mg total) by mouth 2 (two) times daily. 07/30/21  Yes Thornton Park, MD  ferrous sulfate 325 (65 FE) MG tablet Take 1 tablet (325 mg total) by mouth daily with breakfast. Patient taking differently: Take 325 mg by mouth every other day. 09/10/21  Yes Orson Slick, MD  HYDROCORTISONE ACE, RECTAL, 30 MG SUPP Place 1 suppository rectally 2 (two) times daily. 10/20/22  Yes [provider]  levalbuterol (XOPENEX HFA) 45 MCG/ACT inhaler Inhale 1-2 puffs every 6 hours if needed for shortness of breath or wheezing- rescue inhaler. Use 2 puffs 15 minutes prior to exercise. 12/10/21  Yes Cobb, Karie Schwalbe, NP  LORazepam (ATIVAN) 0.5 MG tablet Take 0.5 tablets (0.25 mg total) by mouth 3 (three) times daily as needed for anxiety. 07/21/21  Yes Thornton Park, MD  Norethindrone-Ethinyl Estradiol-Fe Biphas (LO LOESTRIN FE) 1 MG-10 MCG / 10 MCG tablet Take 1 tablet by mouth at bedtime.    Yes [provider]  pantoprazole (PROTONIX) 40 MG tablet Take 1 tablet (40 mg total) by mouth 2 (two) times daily. 12/15/21  Yes Thornton Park, MD  Prenatal Vit-Fe Fumarate-FA (PRENATAL MULTIVITAMIN) TABS tablet Take 1 tablet by mouth daily at 12 noon.   Yes [provider]  SF 1.1 % GEL dental gel Place 1 Application onto teeth in the morning and at bedtime. 10/08/22  Yes [provider]  sucralfate (CARAFATE) 1 g tablet Dissolve 1 tablet in a small amount of water to create a slurry and take po QID prn Patient taking differently: Take 1 g by mouth 4 (four) times daily as needed (ulcers). 09/21/21  Yes Thornton Park, MD  traMADol (ULTRAM) 50 MG tablet Take 50 mg by mouth every 6 (six) hours as needed for moderate pain. 10/05/22  Yes [provider]  vitamin  B-12 (CYANOCOBALAMIN) 1000 MCG tablet Take 1,000 mcg by mouth daily.    Yes [provider]  blood glucose meter kit and supplies Test blood sugar twice daily. Dx code: R73.09 04/25/19   Maryruth Hancock, MD  budesonide-formoterol [facility]) 80-4.5 MCG/ACT inhaler Inhale 1-2 puffs into the lungs 2 (two) times daily. Patient not taking: Reported on 10/30/2022 12/10/21   Clayton Bibles, NP  dicyclomine (BENTYL) 20 MG tablet Take 1 tablet (20 mg total) by mouth 2 (two) times daily. Patient not taking: Reported on 10/30/2022 01/15/22   Maudie Flakes, MD  glucose blood [facility] VERIO) test strip TEST BLOOD SUGAR DAILY 09/02/19   Forrest Moron, MD  ketorolac (TORADOL) 10 MG tablet Take 1 tablet (10 mg total) by mouth every 6 (six) hours as needed. Patient not taking: Reported on 94/76/5465 0/3/54   Delora Fuel, MD  [facility] DELICA LANCETS 65K MISC USE TO TEST BLOOD SUGAR DAILY 11/16/18   [name], MD  valsartan (DIOVAN) 40 MG tablet Take 40 mg by mouth daily. 10/24/22   [provider]      Allergies    Penicillins, Tamiflu [oseltamivir phosphate], Tinidazole, and Diltiazem    Review of Systems   Review of Systems  All other systems reviewed and are negative.   Physical Exam Updated Vital Signs BP (!) 160/82   Pulse 61   Temp 98.2 F (36.8 C)   Resp 18   Ht _0  (1.6 m)   Wt 88 kg   LMP  (LMP Unknown) Comment: Negative pregnancy test  SpO2 100%   BMI 34.37 kg/m  Physical Exam Vitals and nursing note reviewed.  Constitutional:      Appearance: She is well-developed.  HENT:     Head: Normocephalic and atraumatic.  Cardiovascular:     Rate and Rhythm: Normal rate and regular rhythm.  Pulmonary:     Effort: Pulmonary effort is normal. No respiratory distress.  Abdominal:     Palpations: Abdomen is soft.     Tenderness: There is no abdominal tenderness. There is no guarding or rebound.  Genitourinary:    Comments: No palpable external hemorrhoids on DRE,  no gross blood Musculoskeletal:        General: No tenderness.  Skin:    General: Skin is warm and dry.  Neurological:     Mental Status: She is alert and oriented to person, place, and time.  Psychiatric:        Behavior: Behavior normal.     ED Results / Procedures / Treatments   Labs (all labs ordered are listed, but only abnormal results are displayed) Labs Reviewed  COMPREHENSIVE METABOLIC PANEL - Abnormal; Notable for the following components:      Result Value   Creatinine, Ser 1.08 (*)    Calcium 8.6 (*)    All other components within normal limits  URINALYSIS, ROUTINE W REFLEX MICROSCOPIC -  Abnormal; Notable for the following components:   Hgb urine dipstick SMALL (*)    Bacteria, UA RARE (*)    All other components within normal limits  CBC WITH DIFFERENTIAL/PLATELET - Abnormal; Notable for the following components:   Lymphs Abs 4.2 (*)    All other components within normal limits  PREGNANCY, URINE  LIPASE, BLOOD    EKG None  Radiology CT ABDOMEN PELVIS W CONTRAST  Result Date: 10/30/2022 CLINICAL DATA:  Rectal pain.  Internal hemorrhoids for a week EXAM: CT ABDOMEN AND PELVIS WITH CONTRAST TECHNIQUE: Multidetector CT imaging of the abdomen and pelvis was performed using the standard protocol following bolus administration of intravenous contrast. RADIATION DOSE REDUCTION: This exam was performed according to the departmental dose-optimization program which includes automated exposure control, adjustment of the mA and/or kV according to patient size and/or use of iterative reconstruction technique. CONTRAST:  137m OMNIPAQUE IOHEXOL 300 MG/ML  SOLN COMPARISON:  01/31/2020 FINDINGS: Lower chest: No acute abnormality. Hepatobiliary: No suspicious focal liver abnormality is seen. No gallstones, gallbladder wall thickening, or biliary dilatation. Pancreas: Unremarkable. No pancreatic ductal dilatation or surrounding inflammatory changes. Spleen: Normal in size without  focal abnormality. Adrenals/Urinary Tract: Adrenal glands are unremarkable. Kidneys are normal, without renal calculi, suspicious focal lesion, or hydronephrosis. Bladder is unremarkable. Stomach/Bowel: Stomach is within normal limits. No evidence of bowel wall thickening, distention, or inflammatory changes. The appendix is normal. Vascular/Lymphatic: No significant vascular findings are present. No enlarged abdominal or pelvic lymph nodes. Reproductive: Fibroid uterus. Other: No free intraperitoneal fluid or air. Musculoskeletal: No acute or significant osseous findings. IMPRESSION: No acute abnormality in the abdomen or pelvis. Fibroid uterus. Electronically Signed   By: TPlacido SouM.D.   On: 10/30/2022 01:06    Procedures Procedures    Medications Ordered in ED Medications  lidocaine (XYLOCAINE) 2 % jelly 1 Application (has no administration in time range)  iohexol (OMNIPAQUE) 300 MG/ML solution 100 mL (100 mLs Intravenous Contrast Given 10/30/22 0046)    ED Course/ Medical Decision Making/ A&P                           Medical Decision Making Amount and/or Complexity of Data Reviewed Labs: ordered.   Patient here for evaluation of rectal pain, recently treated for hemorrhoids.  Labs with creatinine near her baseline, normal CBC.  CT abdomen pelvis was obtained, which is negative for perirectal abscess.  Presentation is not consistent with proctitis.  Discussed with patient home care for rectal pain, potential internal hemorrhoids.  Providing Xylocaine and that she may use as needed.  Discussed importance of ongoing GI follow-up for colonoscopy and further evaluation.        Final Clinical Impression(s) / ED Diagnoses Final diagnoses:  Rectal pain    Rx / DC Orders ED Discharge Orders     None         RQuintella Reichert MD 10/30/22 [phone]

## 2022-11-03 ENCOUNTER — Encounter: Payer: Self-pay | Admitting: Nurse Practitioner

## 2022-11-03 ENCOUNTER — Ambulatory Visit: Payer: Managed Care, Other (non HMO) | Attending: Cardiology | Admitting: Nurse Practitioner

## 2022-11-03 DIAGNOSIS — Z01810 Encounter for preprocedural cardiovascular examination: Secondary | ICD-10-CM | POA: Diagnosis not present

## 2022-11-03 NOTE — Progress Notes (Signed)
Virtual Visit via Telephone Note   Because of Brooke Turner's co-morbid illnesses, she is at least at moderate risk for complications without adequate follow up.  This format is felt to be most appropriate for this patient at this time.  The patient did not have access to video technology/had technical difficulties with video requiring transitioning to audio format only (telephone).  All issues noted in this document were discussed and addressed.  No physical exam could be performed with this format.  Please refer to the patient's chart for her consent to telehealth for [facility].  Evaluation Performed:  Preoperative cardiovascular risk assessment _____________   Date:  11/03/2022   Patient ID:  Brooke Turner, DOB [birth year], MRN [id number] Patient Location:  Home Provider location:   Office  Primary Care Provider:  Fanny Bien, MD Primary Cardiologist:  Minus Breeding, MD  Chief Complaint / Patient Profile   46 y.o. y/o female with a h/o chest pain (coronary CTA with calcium score of 0), hypertension, and anxiety who is pending colonoscopy with [facility] and presents today for telephonic preoperative cardiovascular risk assessment.  History of Present Illness    Brooke Turner is a 46 y.o. female who presents via audio/video conferencing for a telehealth visit today.  Pt was last seen in cardiology clinic on 08/26/2022 by Jory Sims, NP.  At that time Kenyonna E Turner was stable from a cardiac standpoint. She did note nonexertional chest discomfort. No further testing was recommended. The patient is now pending procedure as outlined above. Since her last visit, she has been stable from a cardiac standpoint. She has stable nonexertional chest discomfort, unchanged from prior visits. She notes rare fleeting palpitations, mild intermittent dizziness.   She denies dyspnea, pnd, orthopnea, n, v, syncope, edema, weight gain, or  early satiety. All other systems reviewed and are otherwise negative except as noted above.   Past Medical History    Past Medical History:  Diagnosis Date   Anxiety    Back pain    Chest pain    a. 09/2014 Echo: EF 60-65%, no rwma, nl valves;  b. 12/2014 Neg ETT.   Essential hypertension    a. 01/2016 Renal duplex: no RAS- ? renal cyst (renal u/s 4/17 - no cyst, nl study); b. 02/2016 24 hr BP Monitor: Mean SBP ~ 138 with Max of 198. Highest pressures noted in evening hours following placement of cuff.   Fibroid uterus    GERD (gastroesophageal reflux disease)    Hiatal hernia    Palpitations    a. 09/2014 nl event monitor.   Past Surgical History:  Procedure Laterality Date   7 HOUR [place] STUDY N/A 09/01/2021   Procedure: [place] STUDY;  Surgeon: Thornton Park, MD;  Location: [facility];  Service: Gastroenterology;  Laterality: N/A;   ESOPHAGEAL MANOMETRY N/A 09/01/2021   Procedure: ESOPHAGEAL MANOMETRY (EM);  Surgeon: Thornton Park, MD;  Location: [facility];  Service: Gastroenterology;  Laterality: N/A;   ESOPHAGOGASTRODUODENOSCOPY  01/2020   WISDOM TOOTH EXTRACTION      Allergies  Allergies  Allergen Reactions   Penicillins Hives    Has patient had a PCN reaction causing immediate rash, facial/tongue/throat swelling, SOB or lightheadedness with hypotension:Yes Has patient had a PCN reaction causing severe rash involving mucus membranes or skin necrosis:unsure Has patient had a PCN reaction that required hospitalization: Yes Has patient had a PCN reaction occurring within the last 10 years: No If all of the above answers  are "NO", then may proceed with Cephalosporin use.      Tamiflu [Oseltamivir Phosphate] Nausea And Vomiting    Excessive vomiting   Tinidazole Other (See Comments)    Caused severe abd pain/type of colitis    Diltiazem Other (See Comments)    Dizzy, felt like she was going to pass out Blood pressure went up per patient     Home  Medications    Prior to Admission medications   Medication Sig Start Date End Date Taking? Authorizing Provider  acetaminophen (TYLENOL) 500 MG tablet Take 500 mg by mouth every 6 (six) hours as needed (back pain/ cramps).     [provider]  atenolol (TENORMIN) 25 MG tablet Take 1 tablet by mouth twice daily 04/05/22   Minus Breeding, MD  Azelastine HCl 137 MCG/SPRAY SOLN Place 1 spray into both nostrils 2 (two) times daily as needed (allergies).    [provider]  azithromycin (ZITHROMAX) 250 MG tablet Take 250-500 mg by mouth as directed. Take 2 tablets on Day 1 Then Take 1 tablet for 4 Days 10/26/22   [provider]  blood glucose meter kit and supplies Test blood sugar twice daily. Dx code: R73.09 04/25/19   Maryruth Hancock, MD  budesonide-formoterol [facility]) 80-4.5 MCG/ACT inhaler Inhale 1-2 puffs into the lungs 2 (two) times daily. Patient not taking: Reported on 10/30/2022 12/10/21   Clayton Bibles, NP  cholecalciferol (VITAMIN D3) 25 MCG (1000 UNIT) tablet Take 1,000 Units by mouth daily.    [provider]  dicyclomine (BENTYL) 20 MG tablet Take 1 tablet (20 mg total) by mouth 2 (two) times daily. Patient not taking: Reported on 10/30/2022 01/15/22   Maudie Flakes, MD  docusate sodium (COLACE) 50 MG capsule Take 50 mg by mouth daily.    [provider]  famotidine (PEPCID) 20 MG tablet Take 1 tablet (20 mg total) by mouth 2 (two) times daily. 07/30/21   Thornton Park, MD  ferrous sulfate 325 (65 FE) MG tablet Take 1 tablet (325 mg total) by mouth daily with breakfast. Patient taking differently: Take 325 mg by mouth every other day. 09/10/21   Ledell Peoples IV, MD  glucose blood (ONETOUCH VERIO) test strip TEST BLOOD SUGAR DAILY 09/02/19   Delia Chimes A, MD  HYDROCORTISONE ACE, RECTAL, 30 MG SUPP Place 1 suppository rectally 2 (two) times daily. 10/20/22   [provider]  ketorolac (TORADOL) 10 MG tablet Take 1 tablet (10  mg total) by mouth every 6 (six) hours as needed. Patient not taking: Reported on 94/58/5929 12/11/44   Delora Fuel, MD  levalbuterol [facility] HFA) 45 MCG/ACT inhaler Inhale 1-2 puffs every 6 hours if needed for shortness of breath or wheezing- rescue inhaler. Use 2 puffs 15 minutes prior to exercise. 12/10/21   Cobb, Karie Schwalbe, NP  LORazepam (ATIVAN) 0.5 MG tablet Take 0.5 tablets (0.25 mg total) by mouth 3 (three) times daily as needed for anxiety. 07/21/21   Thornton Park, MD  Norethindrone-Ethinyl Estradiol-Fe Biphas (LO LOESTRIN FE) 1 MG-10 MCG / 10 MCG tablet Take 1 tablet by mouth at bedtime.     [provider]  Jonetta Speak LANCETS 28M MISC USE TO TEST BLOOD SUGAR DAILY 11/16/18   [name], MD  pantoprazole (PROTONIX) 40 MG tablet Take 1 tablet (40 mg total) by mouth 2 (two) times daily. 12/15/21   Thornton Park, MD  Prenatal Vit-Fe Fumarate-FA (PRENATAL MULTIVITAMIN) TABS tablet Take 1 tablet by mouth daily at  12 noon.    [provider]  SF 1.1 % GEL dental gel Place 1 Application onto teeth in the morning and at bedtime. 10/08/22   [provider]  sucralfate (CARAFATE) 1 g tablet Dissolve 1 tablet in a small amount of water to create a slurry and take po QID prn Patient taking differently: Take 1 g by mouth 4 (four) times daily as needed (ulcers). 09/21/21   Thornton Park, MD  traMADol (ULTRAM) 50 MG tablet Take 50 mg by mouth every 6 (six) hours as needed for moderate pain. 10/05/22   [provider]  valsartan (DIOVAN) 40 MG tablet Take 40 mg by mouth daily. 10/24/22   [provider]  vitamin B-12 (CYANOCOBALAMIN) 1000 MCG tablet Take 1,000 mcg by mouth daily.     [provider]    Physical Exam    Vital Signs:  Fleda E Turner does not have vital signs available for review today.  Given telephonic nature of communication, physical exam is limited. AAOx3. NAD. Normal affect.  Speech and respirations are  unlabored.  Accessory Clinical Findings    None  Assessment & Plan    1.  Preoperative Cardiovascular Risk Assessment:  According to the Revised Cardiac Risk Index (RCRI), her Perioperative Risk of Major Cardiac Event is (%): 0.4. Her Functional Capacity in METs is: 7.99 according to the [place] Activity Status Index (DASI).Therefore, based on ACC/AHA guidelines, patient would be at acceptable risk for the planned procedure without further cardiovascular testing.  The patient was advised that if she develops new symptoms prior to surgery to contact our office to arrange for a follow-up visit, and she verbalized understanding.  A copy of this note will be routed to requesting surgeon.  Time:   Today, I have spent 10 minutes with the patient with telehealth technology discussing medical history, symptoms, and management plan.     Lenna Sciara, NP  11/03/2022, 10:22 AM

## 2022-11-22 ENCOUNTER — Encounter: Payer: Managed Care, Other (non HMO) | Admitting: Gastroenterology

## 2022-11-23 ENCOUNTER — Encounter: Payer: Self-pay | Admitting: Gastroenterology

## 2022-12-06 ENCOUNTER — Encounter: Payer: Managed Care, Other (non HMO) | Admitting: Gastroenterology

## 2023-01-02 ENCOUNTER — Other Ambulatory Visit: Payer: Self-pay | Admitting: Gastroenterology

## 2023-01-02 DIAGNOSIS — K219 Gastro-esophageal reflux disease without esophagitis: Secondary | ICD-10-CM

## 2023-01-06 ENCOUNTER — Encounter: Payer: Managed Care, Other (non HMO) | Admitting: Gastroenterology

## 2023-01-11 ENCOUNTER — Other Ambulatory Visit: Payer: Self-pay | Admitting: Internal Medicine

## 2023-01-11 DIAGNOSIS — R0609 Other forms of dyspnea: Secondary | ICD-10-CM

## 2023-01-13 ENCOUNTER — Telehealth: Payer: Self-pay | Admitting: Emergency Medicine

## 2023-01-13 DIAGNOSIS — R0609 Other forms of dyspnea: Secondary | ICD-10-CM

## 2023-01-13 MED ORDER — LEVALBUTEROL TARTRATE 45 MCG/ACT IN AERO: INHALATION_SPRAY | RESPIRATORY_TRACT | 0 refills | Status: DC

## 2023-01-13 NOTE — Telephone Encounter (Signed)
Called and spoke with pt letting her know that we had denied a refill of her levalbuterol inhaler due to it being over a year since we had seen her. Pt verbalized understanding. Pt did schedule an appt while on phone with me so I did send one inhaler to pharmacy for her. Stated to pt that she must keep that appt in order to receive more refills. Nothing further needed.

## 2023-01-13 NOTE — Telephone Encounter (Signed)
Pt is a pt of Dr. Annamaria Boots (routed to incorrect provider) Pt states the pharmacy req for her levalbuterol [facility] HFA) 45 MCG/ACT inhaler and the req was denied, pls advise pt on next steps

## 2023-01-26 ENCOUNTER — Telehealth: Payer: Self-pay | Admitting: Internal Medicine

## 2023-01-26 NOTE — Telephone Encounter (Signed)
Can we start the process for the prior auth for her Levoalbbuterol

## 2023-01-26 NOTE — Telephone Encounter (Signed)
PT calling about Prior Auth. It's been two weeks and her Pharm urged her to call us.   I see some old tel encounters for PA. Please call PT to advise @ [phone]  Drug is: Levalbuterol  Pharm: [facility] on [address]

## 2023-01-27 NOTE — Telephone Encounter (Signed)
PA has been submitted via CMM and is pending determination.  Key: [id number]

## 2023-02-01 ENCOUNTER — Ambulatory Visit: Payer: Managed Care, Other (non HMO) | Admitting: Nurse Practitioner

## 2023-02-02 NOTE — Telephone Encounter (Signed)
Patient Advocate Encounter  Prior Authorization for Levalbuterol Tartrate 45MCG/ACT aerosol has been approved through [employer].    KeySallye Ober  Effective: 01-27-2023 to 01-27-2024

## 2023-04-15 ENCOUNTER — Other Ambulatory Visit: Payer: Self-pay | Admitting: Cardiology

## 2023-05-23 ENCOUNTER — Ambulatory Visit (INDEPENDENT_AMBULATORY_CARE_PROVIDER_SITE_OTHER): Payer: Managed Care, Other (non HMO)

## 2023-05-23 ENCOUNTER — Ambulatory Visit (INDEPENDENT_AMBULATORY_CARE_PROVIDER_SITE_OTHER): Payer: Managed Care, Other (non HMO) | Admitting: Podiatry

## 2023-05-23 DIAGNOSIS — M79671 Pain in right foot: Secondary | ICD-10-CM

## 2023-05-23 DIAGNOSIS — M79672 Pain in left foot: Secondary | ICD-10-CM

## 2023-05-23 DIAGNOSIS — M722 Plantar fascial fibromatosis: Secondary | ICD-10-CM

## 2023-05-23 DIAGNOSIS — M7751 Other enthesopathy of right foot: Secondary | ICD-10-CM | POA: Diagnosis not present

## 2023-05-23 MED ORDER — MELOXICAM 7.5 MG PO TABS: 7.5000 mg | ORAL_TABLET | Freq: Every day | ORAL | 0 refills | Status: DC

## 2023-05-23 MED ORDER — METHYLPREDNISOLONE 4 MG PO TBPK: ORAL_TABLET | ORAL | 0 refills | Status: DC

## 2023-05-23 NOTE — Patient Instructions (Signed)

## 2023-05-23 NOTE — Progress Notes (Signed)
Chief Complaint  Patient presents with   Plantar Fasciitis    Patient came in today for bilateral foot pain, heel, started NOV 2023, right foot is worse than the left foot, rate of pain 10 out of 10, morning pain, X-Rays done today    HPI: 47 y.o. female presenting today with c/o pain in the bottom of both heels.  Right >> Left foot.  Pain with first steps out of bed in the AM.  Aggravated with WB and walking.  Denies trauma or bruising.  Past Medical History:  Diagnosis Date   Anxiety    Back pain    Chest pain    a. 09/2014 Echo: EF 60-65%, no rwma, nl valves;  b. 12/2014 Neg ETT.   Essential hypertension    a. 01/2016 Renal duplex: no RAS- ? renal cyst (renal u/s 4/17 - no cyst, nl study); b. 02/2016 24 hr BP Monitor: Mean SBP ~ 138 with Max of 198. Highest pressures noted in evening hours following placement of cuff.   Fibroid uterus    GERD (gastroesophageal reflux disease)    Hiatal hernia    Palpitations    a. 09/2014 nl event monitor.    Past Surgical History:  Procedure Laterality Date   81 HOUR PH STUDY N/A 09/01/2021   Procedure: 24 HOUR PH STUDY;  Surgeon: Tressia Danas, MD;  Location: [facility];  Service: Gastroenterology;  Laterality: N/A;   ESOPHAGEAL MANOMETRY N/A 09/01/2021   Procedure: ESOPHAGEAL MANOMETRY (EM);  Surgeon: Tressia Danas, MD;  Location: [facility];  Service: Gastroenterology;  Laterality: N/A;   ESOPHAGOGASTRODUODENOSCOPY  01/2020   WISDOM TOOTH EXTRACTION      Allergies  Allergen Reactions   Penicillins Hives    Has patient had a PCN reaction causing immediate rash, facial/tongue/throat swelling, SOB or lightheadedness with hypotension:Yes Has patient had a PCN reaction causing severe rash involving mucus membranes or skin necrosis:unsure Has patient had a PCN reaction that required hospitalization: Yes Has patient had a PCN reaction occurring within the last 10 years: No If all of the above answers are "NO", then may proceed  with Cephalosporin use.      Tamiflu [Oseltamivir Phosphate] Nausea And Vomiting    Excessive vomiting   Tinidazole Other (See Comments)    Caused severe abd pain/type of colitis    Diltiazem Other (See Comments)    Dizzy, felt like she was going to pass out Blood pressure went up per patient     Physical Exam: General: The patient is alert and oriented x3 in no acute distress.  Dermatology:  No ecchymosis, erythema, or edema bilateral.  No open lesions.    Vascular: Palpable pedal pulses bilaterally. Capillary refill within normal limits.  No appreciable edema.    Neurological: Light touch sensation intact bilateral.  MMT 5/5 to lower extremity bilateral. Negative Tinel's sign with percussion of the posterior tibial nerve on the affected extremity.    Musculoskeletal Exam:  There is pain on palpation of the plantarmedial & plantarcentral aspect of both heels, right worse than left.  Tight central plantar fascial band bilateral.  No gaps or nodules within the plantar fascia.  Positive Windlass mechanism bilateral.  Antalgic gait noted with first few steps upon standing.  No pain on palpation of achilles tendon bilateral.  Ankle df less than 10 degrees with knee extended b/l.  Radiographic Exam B/L foot, 3 WB views, 05/23/23:  Normal osseous mineralization. No fractures noted.  Small inferior calcaneal spurs bilateral.  Increase in first IM angle bilateral.    Assessment/Plan of Care: 1. Plantar fascial fibromatosis   2. Bilateral foot pain   3. Bursitis of right foot     Meds ordered this encounter  Medications   methylPREDNISolone (MEDROL DOSEPAK) 4 MG TBPK tablet    Sig: Take as directed    Dispense:  21 tablet    Refill:  0   meloxicam (MOBIC) 7.5 MG tablet    Sig: Take 1 tablet (7.5 mg total) by mouth daily.    Dispense:  20 tablet    Refill:  0   Reviewed x-rays and clinical findings with the patient today.  -Reviewed etiology of plantar fasciitis with patient.   Discussed treatment options with patient today, including cortisone injection, NSAID course of treatment, stretching exercises, physical therapy, use of night splint, rest, icing the heel, arch supports/orthotics, and supportive shoe gear.    Patient was fitted for power steps to wear in her shoes for additional arch support.  Night splint was dispensed for the right foot.  She will wear this at all times sleeping.  This is a static AFO meant to be worn when nonweightbearing, during sleeping hours, with a soft interface lining material.  Will hold off on a cortisone injection at this time.  Will start the patient on a Medrol Dosepak for the first week and once this is finished, she will start meloxicam 7.5 mg daily for an additional 10 days.  Discussed how this is one of the anti-inflammatories that is less likely to aggravate her blood pressure.  Return in about 4 weeks (around 06/20/2023) for f/u plantar fasciitis.   Clerance Lav, DPM, FACFAS [facility]     [address] [place], [place] [address]                Office [phone]  Fax [phone]

## 2023-05-26 ENCOUNTER — Telehealth: Payer: Self-pay | Admitting: Cardiology

## 2023-05-26 NOTE — Telephone Encounter (Signed)
Left message for the patient to call the clinic.

## 2023-05-26 NOTE — Telephone Encounter (Signed)
Pt c/o of Chest Pain: STAT if CP now or developed within 24 hours  1. Are you having CP right now? No  2. Are you experiencing any other symptoms (ex. SOB, nausea, vomiting, sweating)? A little nauseous and lightheaded  3. How long have you been experiencing CP? 2 Weeks ago   4. Is your CP continuous or coming and going? Coming and Going, lately it has been more consistent   5. Have you taken Nitroglycerin? No  Patient mentioned the pain in her chest is like a squeezing pain. Patient stated that she gets a little nauseous, but stated that she gets lightheaded randomly.  Patient mentioned she will need pro op clearance of an upcoming colonoscopy. Patient stated that the office that will do the colonoscopy will send a pre-op fax to [place]. ?

## 2023-05-26 NOTE — Telephone Encounter (Signed)
Patient is returning call and is requesting call back.  

## 2023-05-26 NOTE — Telephone Encounter (Signed)
Spoke with pt and has noted chest pain"sore" for 1 - 1 1/2 weeks occurs every day occ nausea Per pt  has happened at night as well Appt made for 06/09/23 at 1:55 with Edd Fabian NP Pt aware if S/S worsen to go to ED for eval and tx

## 2023-05-31 ENCOUNTER — Telehealth: Payer: Self-pay

## 2023-05-31 NOTE — Telephone Encounter (Signed)
   Pre-operative Risk Assessment    Patient Name: Brooke Turner  DOB: November 06, 1976 MRN: [id number]      Request for Surgical Clearance    Procedure:   Colonoscopy  Date of Surgery:  Clearance TBD                                 Surgeon:  Dr. Charlott Rakes Surgeon's Group or Practice Name:  [facility] Phone number:  [phone] Fax number:  [phone]   Type of Clearance Requested:   - Medical    Type of Anesthesia:   Propofol   Additional requests/questions:    Signed, Zada Finders   05/31/2023, 10:17 AM

## 2023-05-31 NOTE — Telephone Encounter (Signed)
Pt is scheduled 8/2. Preop clearance already noted in appt notes. Will send to [facility].

## 2023-05-31 NOTE — Telephone Encounter (Signed)
Patient already has an appointment with Edd Fabian, NP on 06/09/23 at which time clearance can be addressed. Appointment notes have been updated.  Levi Aland, NP-C  05/31/2023, 10:56 AM [address], [address] Office [phone] Fax [phone]

## 2023-06-07 NOTE — Progress Notes (Signed)
Cardiology Office Note:    Date:  06/09/2023   ID:  [name], DOB 17-Jun-1976, MRN [id number]  PCP:  Lewis Moccasin, MD   [facility] Providers Cardiologist:  Rollene Rotunda, MD Cardiology APP:  Jodelle Gross, NP  Electrophysiologist:  Sherryl Manges, MD      Referring MD: Lewis Moccasin, MD   Follow-up for palpitations and preoperative cardiac evaluation  History of Present Illness:    Brooke Turner is a 47 y.o. female with a hx of anxiety, back pain, chest pain (echocardiogram 11/15 EF 60-65%, 2/16 negative ETT.),  Essential hypertension, GERD, and palpitations.   She was last seen by Dr. Antoine Poche on 03/26/2020.  During that time she was seen for follow-up evaluation of her chest pain.  She did not tolerate 60 mg of Imdur.  Her dose was reduced to 15 mg which she tolerated much better.  She was also started on pantoprazole which helped improved her chest discomfort.  She denied shortness of breath, PND, orthopnea, palpitations, presyncope, syncope, lower extremity edema, and weight gain.   She walked in the clinic 08/21/2020 and indicated she had been having episodes of fast heartbeat for several months.  She noticed increased episodes when she had a lot of gas.   She presented to the clinic 08/25/2020 for evaluation and stated over the past several weeks she had noticed increasing episodes of irregular heart rate.  She stated that these episodes come on just prior to belching and subside.  She indicated that she also has a family history of elevated lipoprotein a and was tested.  She was having the results faxed to our office.  She denied use of caffeine and triggers that may cause palpitations.  She also noticed intermittent periods of lightheadedness not associated with palpitations.  We reviewed triggers for palpitations.  I  ordered a 14-day ZIO monitor and fasting lipid panel.   Follow-up was planned for 8 weeks.  Her cardiac event monitor  showed no arrhythmias.  Her symptoms occurred during normal sinus rhythm.  Her event monitor showed normal sinus rhythm, rare atrial ectopy, and no sustained arrhythmias   She presented to the clinic 11/25/2020 for follow-up evaluation stated she had noticed some increased activity intolerance and shortness of breath since last fall.  She had decreased her physical activity and noticed that she was not able to do the physical activity that she was able to do prior to last fall.  We reviewed her cardiac event monitor results and I reassured her that her episodes of palpitations related to increased gas did not show cardiac issues.  She expressed understanding.  I have encouraged her to keep a food log and cut out foods that cause her symptoms/issues. I order an echocardiogram to follow-up on her dyspnea with exertion.   Her follow-up in 3 months was planned, gave her the salty 6 diet sheet, and asked her to increase her physical activity as tolerated.  Her echocardiogram showed normal pumping function, no valvular abnormalities on 01/05/2021.  She was seen by Dr. Antoine Poche for chest pain and palpitations.  She had a CT that did not show PE.  A small hiatal hernia was noted.  She reported that she had occasional episodes of chest discomfort and occasional episodes of palpitations.  She had increased her physical activity slightly.  She denied episodes of new chest pressure arm and neck discomfort.  She was not having any shortness of breath PND orthopnea.  And she denied  presyncope and syncope.  Her coronary calcium score in 2020 was 0.  Due to her lack of change in symptomology no further ischemic evaluation was ordered.  She was given sublingual nitroglycerin for as needed use.  She presented to the clinic 06/22/21 for follow-up evaluation stated she had been noticing intermittent periods of sharp sporadic chest pain.  She reported the chest pain would come on at rest lasts for seconds and then dissipates without  intervention.  She reported that she had lost around 30 pounds in the last 18 months.  We reviewed her previous echocardiogram, coronary calcium scoring, cardiac event monitors, and labs from the emergency department.  She also reported that she had been having occasional periods of palpitations.  I asked her to increase her physical activity as tolerated, drew a TSH and magnesium level, and planned her follow-up in 4 to 6 months.  She was seen and evaluated by Bailey Mech, DNP on 08/26/2022.  She complained of extreme sharp pain in her chest that would last for a few seconds and radiate into her arm.  She describes the feeling as shocking and achy.  She has been seen in the emergency department for similar pain and abdominal cramping in August.  She was ruled out for ACS.  She was concerned about strong family history of heart disease.  She reported that her pain had been occurring daily over the past week but had been intermittent over the last 3 weeks.  She continued to try to lose weight and exercise but was afraid to do so to due to her discomfort.  She denied caffeine use, carbonated soft drinks and gas-producing foods.  Her discomfort would come on mostly while sitting.  It was felt that her discomfort was noncardiac in nature.  She was given reassurance.  Her coronary CTA was reviewed as well as her normal echocardiogram.  She presents to the clinic today for follow-up evaluation and preoperative cardiac evaluation.  She states she continues to work from home and is fairly sedentary.  She does do some yard work.  We reviewed her previous cardiac testing.  She expressed understanding.  She inquires about LP(a).  She also reports that she has started smoking about 4 times per week over the last 4 months.  She attributes this to stress.  She continues to have atypical type chest discomfort at rest.  We reviewed the importance of heart healthy low-sodium diet, maintaining physical activity, and  avoiding tobacco.  She expressed understanding.  Her blood pressure today is slightly elevated at 144/94 and on recheck 140/86.  I will increase her valsartan, order BMP, order LP(a) and fasting lipids with LFTs and plan follow-up in 6 months.     Today she denies chest pain, lower extremity edema, fatigue, palpitations, melena, hematuria, hemoptysis, diaphoresis, weakness, presyncope, syncope, orthopnea, and PND.  Past Medical History:  Diagnosis Date   Anxiety    Back pain    Chest pain    a. 09/2014 Echo: EF 60-65%, no rwma, nl valves;  b. 12/2014 Neg ETT.   Essential hypertension    a. 01/2016 Renal duplex: no RAS- ? renal cyst (renal u/s 4/17 - no cyst, nl study); b. 02/2016 24 hr BP Monitor: Mean SBP ~ 138 with Max of 198. Highest pressures noted in evening hours following placement of cuff.   Fibroid uterus    GERD (gastroesophageal reflux disease)    Hiatal hernia    Palpitations    a. 09/2014 nl event monitor.  Past Surgical History:  Procedure Laterality Date   62 HOUR PH STUDY N/A 09/01/2021   Procedure: 24 HOUR PH STUDY;  Surgeon: Tressia Danas, MD;  Location: [facility];  Service: Gastroenterology;  Laterality: N/A;   ESOPHAGEAL MANOMETRY N/A 09/01/2021   Procedure: ESOPHAGEAL MANOMETRY (EM);  Surgeon: Tressia Danas, MD;  Location: [facility];  Service: Gastroenterology;  Laterality: N/A;   ESOPHAGOGASTRODUODENOSCOPY  01/2020   WISDOM TOOTH EXTRACTION      Current Medications: Current Meds  Medication Sig   acetaminophen (TYLENOL) 500 MG tablet Take 500 mg by mouth every 6 (six) hours as needed (back pain/ cramps).    atenolol (TENORMIN) 25 MG tablet Take 1 tablet by mouth twice daily   Azelastine HCl 137 MCG/SPRAY SOLN Place 1 spray into both nostrils 2 (two) times daily as needed (allergies).   blood glucose meter kit and supplies Test blood sugar twice daily. Dx code: R73.09   cholecalciferol (VITAMIN D3) 25 MCG (1000 UNIT) tablet Take 1,000 Units by  mouth daily.   dicyclomine (BENTYL) 20 MG tablet Take 1 tablet (20 mg total) by mouth 2 (two) times daily.   docusate sodium (COLACE) 50 MG capsule Take 50 mg by mouth daily.   famotidine (PEPCID) 20 MG tablet Take 1 tablet (20 mg total) by mouth 2 (two) times daily.   ferrous sulfate 325 (65 FE) MG tablet Take 1 tablet (325 mg total) by mouth daily with breakfast. (Patient taking differently: Take 325 mg by mouth every other day.)   glucose blood (ONETOUCH VERIO) test strip TEST BLOOD SUGAR DAILY   HYDROCORTISONE ACE, RECTAL, 30 MG SUPP Place 1 suppository rectally 2 (two) times daily.   ketorolac (TORADOL) 10 MG tablet Take 1 tablet (10 mg total) by mouth every 6 (six) hours as needed.   levalbuterol (XOPENEX HFA) 45 MCG/ACT inhaler Inhale 1-2 puffs every 6 hours if needed for shortness of breath or wheezing- rescue inhaler. Use 2 puffs 15 minutes prior to exercise.   LORazepam (ATIVAN) 0.5 MG tablet Take 0.5 tablets (0.25 mg total) by mouth 3 (three) times daily as needed for anxiety.   meclizine (ANTIVERT) 25 MG tablet Take 25 mg by mouth every 8 (eight) hours as needed.   meloxicam (MOBIC) 7.5 MG tablet Take 1 tablet (7.5 mg total) by mouth daily.   methylPREDNISolone (MEDROL DOSEPAK) 4 MG TBPK tablet Take as directed   Multiple Vitamins-Minerals (EQ MULTIVITAMINS ADULT GUMMY) CHEW Chew by mouth 2 (two) times daily.   nitroGLYCERIN (NITROSTAT) 0.4 MG SL tablet Place 0.4 mg under the tongue every 5 (five) minutes as needed.   Norethindrone-Ethinyl Estradiol-Fe Biphas (LO LOESTRIN FE) 1 MG-10 MCG / 10 MCG tablet Take 1 tablet by mouth at bedtime.    ONETOUCH DELICA LANCETS 33G MISC USE TO TEST BLOOD SUGAR DAILY   pantoprazole (PROTONIX) 40 MG tablet Take 1 tablet (40 mg total) by mouth 2 (two) times daily.   Prenatal Vit-Fe Fumarate-FA (PRENATAL MULTIVITAMIN) TABS tablet Take 1 tablet by mouth daily at 12 noon.   SF 1.1 % GEL dental gel Place 1 Application onto teeth in the morning and at  bedtime.   sucralfate (CARAFATE) 1 g tablet Dissolve 1 tablet in a small amount of water to create a slurry and take po QID prn (Patient taking differently: Take 1 g by mouth 4 (four) times daily as needed (ulcers).)   valsartan (DIOVAN) 40 MG tablet Take 60 mg by mouth daily.   vitamin B-12 (CYANOCOBALAMIN) 1000 MCG tablet Take 1,000  mcg by mouth daily.      Allergies:   Penicillins, Tamiflu [oseltamivir phosphate], Tinidazole, and Diltiazem   Social History   Socioeconomic History   Marital status: Single    Spouse name: N/A   Number of children: 0   Years of education: 16   Highest education level: Not on file  Occupational History   Occupation: Nutritional therapist: [employer]  Tobacco Use   Smoking status: Former    Current packs/day: 0.00    Average packs/day: 0.3 packs/day for 12.0 years (3.0 ttl pk-yrs)    Types: Cigarettes    Start date: 1999    Quit date: 2011    Years since quitting: 13.5   Smokeless tobacco: Never  Vaping Use   Vaping status: Never Used  Substance and Sexual Activity   Alcohol use: No    Alcohol/week: 0.0 standard drinks of alcohol   Drug use: No   Sexual activity: Not Currently    Partners: Male    Birth control/protection: Pill  Other Topics Concern   Not on file  Social History Narrative   Patient lives at home alone .   Patient works full time at [employer].   Education college.   Right handed.   Caffeine none   Social Determinants of Health   Financial Resource Strain: Not on file  Food Insecurity: Not on file  Transportation Needs: Not on file  Physical Activity: Not on file  Stress: Not on file  Social Connections: Unknown (03/18/2022)   Received from [employer]   Social Network    Social Network: Not on file     Family History: The patient's family history includes Alzheimer's disease in her maternal grandmother; Cancer in her father; Diabetes (age of onset: 51) in her father; Epilepsy in her mother; GER  disease in her mother; Heart disease in her maternal grandfather; Heart disease (age of onset: 70) in her father; Hypertension in her father; Seizures in her mother. There is no history of Colon cancer or Breast cancer.  ROS:   Please see the history of present illness.     All other systems reviewed and are negative.   Risk Assessment/Calculations:           Physical Exam:    VS:  BP (!) 140/86   Pulse 69   Ht 5\' 4"  (1.626 m)   Wt 220 lb (99.8 kg)   SpO2 97%   BMI 37.76 kg/m     Wt Readings from Last 3 Encounters:  06/09/23 220 lb (99.8 kg)  10/29/22 194 lb (88 kg)  08/26/22 198 lb (89.8 kg)     GEN:  Well nourished, well developed in no acute distress HEENT: Normal NECK: No JVD; No carotid bruits LYMPHATICS: No lymphadenopathy CARDIAC: RRR, no murmurs, rubs, gallops RESPIRATORY:  Clear to auscultation without rales, wheezing or rhonchi  ABDOMEN: Soft, non-tender, non-distended MUSCULOSKELETAL:  No edema; No deformity  SKIN: Warm and dry NEUROLOGIC:  Alert and oriented x 3 PSYCHIATRIC:  Normal affect    EKGs/Labs/Other Studies Reviewed:    The following studies were reviewed today: Cardiac event monitor 10/26/2020 Normal sinus rhythm Rare atrial ectopy Multiple symptoms of chest pain, fluttering and skipped beats occur during normal sinus rhythm There are no sustained arrhythmias  Echocardiogram 01/05/2021 IMPRESSIONS     1. Left ventricular ejection fraction, by estimation, is 60 to 65%. The  left ventricle has normal function. The left ventricle has no regional  wall motion abnormalities.  Left ventricular diastolic parameters were  normal.   2. Right ventricular systolic function is normal. The right ventricular  size is normal. There is normal pulmonary artery systolic pressure.   3. The mitral valve is normal in structure. No evidence of mitral valve  regurgitation. No evidence of mitral stenosis.   4. The aortic valve is tricuspid. Aortic valve  regurgitation is not  visualized. Moderate to severe aortic valve stenosis.   5. The inferior vena cava is normal in size with greater than 50%  respiratory variability, suggesting right atrial pressure of 3 mmHg.  Coronary CTA 2020 IMPRESSION: 1. Coronary calcium score of 0. This was 0 percentile for age and sex matched control.   2. Normal coronary origin with right dominance.   3. No evidence of CAD in the LAD or LCx. No CAD in the visualized portions of the RCA but cannot comment on the PDA or PL which were poorly visualized due to motion artifact.  EKG: EKG Interpretation Date/Time:  Friday June 09 2023 13:57:33 EDT Ventricular Rate:  69 PR Interval:  162 QRS Duration:  80 QT Interval:  412 QTC Calculation: 441 R Axis:   48  Text Interpretation: Normal sinus rhythm Reconfirmed by Edd Fabian [phone]) on 06/09/2023 2:05:44 PM    Recent Labs: 10/29/2022: ALT 20; BUN 11; Creatinine, Ser 1.08; Hemoglobin 13.3; Platelets 240; Potassium 3.5; Sodium 138  Recent Lipid Panel    Component Value Date/Time   CHOL 169 08/25/2020 1221   TRIG 86 08/25/2020 1221   HDL 51 08/25/2020 1221   CHOLHDL 3.3 08/25/2020 1221   LDLCALC 102 (H) 08/25/2020 1221   LDLDIRECT 101 (H) 10/25/2016 1850    ASSESSMENT & PLAN   Chest pain/precordial pain- no pain today.  Continues to notice brief intermittent periods of sharp chest pain that radiated to her arm.  Coronary CTA and echocardiograms have been reassuring.  Pain does not appear to be cardiac in nature.  Previously placed on 60 mg of Imdur which she did not tolerate.   Continue to monitor Heart healthy low-sodium diet-reviewed Maintain physical activity  Palpitations-EKG today shows sinus rhythm 69 bpm.  Continue atenolol Avoid triggers caffeine, chocolate, EtOH etc. Continue to monitor  Essential hypertension-BP today 140/86. Maintain blood pressure log increase valsartan to 60 mg daily Bmp in 2 wks  Hyperlipidemia-08/25/2020:  Cholesterol, Total 169; HDL 51; LDL Chol Calc (NIH) 102; Triglycerides 86  Heart healthy low-sodium high-fiber diet Maintain physical activity Repeat fasting lipids , LPa in 2 weeks   Tobacco use-over the past 4 months has started smoking about 4 times per week.  Smoking cessation strongly recommended Smoking cessation information given  Preoperative cardiac evaluation-colonoscopy, [facility], Dr. Charlott Rakes    Primary Cardiologist: Rollene Rotunda, MD  Chart reviewed as part of pre-operative protocol coverage. Given past medical history and time since last visit, based on ACC/AHA guidelines, Daleyza E Felice-Jennings would be at acceptable risk for the planned procedure without further cardiovascular testing.   Patient was advised that if she develops new symptoms prior to surgery to contact our office to arrange a follow-up appointment.  She verbalized understanding.        Disposition: Follow-up with Dr. Antoine Poche in  6 months.        Medication Adjustments/Labs and Tests Ordered: Current medicines are reviewed at length with the patient today.  Concerns regarding medicines are outlined above.  Orders Placed This Encounter  Procedures   Basic metabolic panel   Lipoprotein A (LPA)  Lipid panel   Hepatic function panel   EKG 12-Lead   No orders of the defined types were placed in this encounter.      Signed, Ronney Asters, NP  06/09/2023 2:30 PM      Notice: This dictation was prepared with Dragon dictation along with smaller phrase technology. Any transcriptional errors that result from this process are unintentional and may not be corrected upon review.  I spent 15 minutes examining this patient, reviewing medications, and using patient centered shared decision making involving her cardiac care.  Prior to her visit I spent greater than 20 minutes reviewing her past medical history,  medications, and prior cardiac tests.

## 2023-06-09 ENCOUNTER — Ambulatory Visit: Payer: Managed Care, Other (non HMO) | Attending: General Practice | Admitting: General Practice

## 2023-06-09 ENCOUNTER — Encounter: Payer: Self-pay | Admitting: General Practice

## 2023-06-09 VITALS — BP 140/86 | HR 69 | Ht 64.0 in | Wt 220.0 lb

## 2023-06-09 DIAGNOSIS — Z01810 Encounter for preprocedural cardiovascular examination: Secondary | ICD-10-CM

## 2023-06-09 DIAGNOSIS — I10 Essential (primary) hypertension: Secondary | ICD-10-CM

## 2023-06-09 DIAGNOSIS — R002 Palpitations: Secondary | ICD-10-CM | POA: Diagnosis not present

## 2023-06-09 DIAGNOSIS — R079 Chest pain, unspecified: Secondary | ICD-10-CM | POA: Diagnosis not present

## 2023-06-09 DIAGNOSIS — Z720 Tobacco use: Secondary | ICD-10-CM

## 2023-06-09 NOTE — Patient Instructions (Signed)
Medication Instructions:  INCREASE VALSARTAN 60MG  (1-1/2TAB) DAILY *If you need a refill on your cardiac medications before your next appointment, please call your pharmacy*  Lab Work: FASTING BMET, Lpa, LIPID AND LFT IN 2 WEEKS If you have labs (blood work) drawn today and your tests are completely normal, you will receive your results only by:  MyChart Message (if you have MyChart) OR  A paper copy in the mail If you have any lab test that is abnormal or we need to change your treatment, we will call you to review the results.  Testing/Procedures: NONE   Follow-Up: At [facility], you and your health needs are our priority.  As part of our continuing mission to provide you with exceptional heart care, we have created designated Provider Care Teams.  These Care Teams include your primary Cardiologist (physician) and Advanced Practice Providers (APPs -  Physician Assistants and Nurse Practitioners) who all work together to provide you with the care you need, when you need it.  Your next appointment:   6 month(s)  Provider:   Rollene Rotunda, MD     Other Instructions EASE INTO 150 MIN OF MODERATED PHYSICAL ACTIVITY/WEEKLY  PLEASE READ AND FOLLOW ATTACHED  SALTY 6  PLEASE READ ATTACHED SMOKING CESSATION TIPS      Steps to Quit Smoking Smoking tobacco is the leading cause of preventable death. It can affect almost every organ in the body. Smoking puts you and people around you at risk for many serious, long-lasting (chronic) diseases. Quitting smoking can be hard, but it is one of the best things that you can do for your health. It is never too late to quit. Do not give up if you cannot quit the first time. Some people need to try many times to quit. Do your best to stick to your quit plan, and talk with your doctor if you have any questions or concerns. How do I get ready to quit? Pick a date to quit. Set a date within the next 2 weeks to give you time to prepare. Write  down the reasons why you are quitting. Keep this list in places where you will see it often. Tell your family, friends, and co-workers that you are quitting. Their support is important. Talk with your doctor about the choices that may help you quit. Find out if your health insurance will pay for these treatments. Know the people, places, things, and activities that make you want to smoke (triggers). Avoid them. What first steps can I take to quit smoking? Throw away all cigarettes at home, at work, and in your car. Throw away the things that you use when you smoke, such as ashtrays and lighters. Clean your car. Empty the ashtray. Clean your home, including curtains and carpets. What can I do to help me quit smoking? Talk with your doctor about taking medicines and seeing a counselor. You are more likely to succeed when you do both. If you are pregnant or breastfeeding: Talk with your doctor about counseling or other ways to quit smoking. Do not take medicine to help you quit smoking unless your doctor tells you to. Quit right away Quit smoking completely, instead of slowly cutting back on how much you smoke over a period of time. Stopping smoking right away may be more successful than slowly quitting. Go to counseling. In-person is best if this is an option. You are more likely to quit if you go to counseling sessions regularly. Take medicine You may take  medicines to help you quit. Some medicines need a prescription, and some you can buy over-the-counter. Some medicines may contain a drug called nicotine to replace the nicotine in cigarettes. Medicines may: Help you stop having the desire to smoke (cravings). Help to stop the problems that come when you stop smoking (withdrawal symptoms). Your doctor may ask you to use: Nicotine patches, gum, or lozenges. Nicotine inhalers or sprays. Non-nicotine medicine that you take by mouth. Find resources Find resources and other ways to help you  quit smoking and remain smoke-free after you quit. They include: Online chats with a Veterinary surgeon. Phone quitlines. Printed Materials engineer. Support groups or group counseling. Text messaging programs. Mobile phone apps. Use apps on your mobile phone or tablet that can help you stick to your quit plan. Examples of free services include Quit Guide from the CDC and smokefree.gov  What can I do to make it easier to quit?  Talk to your family and friends. Ask them to support and encourage you. Call a phone quitline, such as 1-800-QUIT-NOW, reach out to support groups, or work with a Veterinary surgeon. Ask people who smoke to not smoke around you. Avoid places that make you want to smoke, such as: Bars. Parties. Smoke-break areas at work. Spend time with people who do not smoke. Lower the stress in your life. Stress can make you want to smoke. Try these things to lower stress: Getting regular exercise. Doing deep-breathing exercises. Doing yoga. Meditating. What benefits will I see if I quit smoking? Over time, you may have: A better sense of smell and taste. Less coughing and sore throat. A slower heart rate. Lower blood pressure. Clearer skin. Better breathing. Fewer sick days. Summary Quitting smoking can be hard, but it is one of the best things that you can do for your health. Do not give up if you cannot quit the first time. Some people need to try many times to quit. When you decide to quit smoking, make a plan to help you succeed. Quit smoking right away, not slowly over a period of time. When you start quitting, get help and support to keep you smoke-free. This information is not intended to replace advice given to you by your health care provider. Make sure you discuss any questions you have with your health care provider. Document Revised: 10/15/2021 Document Reviewed: 10/15/2021 Elsevier Patient Education  2024 [employer].

## 2023-06-26 ENCOUNTER — Ambulatory Visit: Payer: Managed Care, Other (non HMO) | Admitting: Podiatry

## 2023-08-01 ENCOUNTER — Ambulatory Visit: Payer: Managed Care, Other (non HMO) | Admitting: Podiatry

## 2023-08-07 ENCOUNTER — Encounter: Payer: Managed Care, Other (non HMO) | Admitting: Podiatry

## 2023-08-07 NOTE — Progress Notes (Signed)
Patient was a no show for today's appointment

## 2023-09-08 ENCOUNTER — Ambulatory Visit (INDEPENDENT_AMBULATORY_CARE_PROVIDER_SITE_OTHER): Payer: Managed Care, Other (non HMO)

## 2023-09-08 ENCOUNTER — Ambulatory Visit: Payer: Managed Care, Other (non HMO) | Attending: General Practice | Admitting: Emergency Medicine

## 2023-09-08 ENCOUNTER — Encounter: Payer: Self-pay | Admitting: General Practice

## 2023-09-08 VITALS — BP 134/78 | HR 71 | Ht 64.0 in | Wt 227.6 lb

## 2023-09-08 DIAGNOSIS — K219 Gastro-esophageal reflux disease without esophagitis: Secondary | ICD-10-CM

## 2023-09-08 DIAGNOSIS — E782 Mixed hyperlipidemia: Secondary | ICD-10-CM | POA: Diagnosis not present

## 2023-09-08 DIAGNOSIS — I10 Essential (primary) hypertension: Secondary | ICD-10-CM | POA: Diagnosis not present

## 2023-09-08 DIAGNOSIS — R002 Palpitations: Secondary | ICD-10-CM

## 2023-09-08 DIAGNOSIS — R0789 Other chest pain: Secondary | ICD-10-CM

## 2023-09-08 NOTE — Progress Notes (Unsigned)
Applied a 3 day Zio XT monitor to patient in the office  Hochrein to read

## 2023-09-08 NOTE — Patient Instructions (Signed)
Medication Instructions:  The current medical regimen is effective;  continue present plan and medications as directed. Please refer to the Current Medication list given to you today.  *If you need a refill on your cardiac medications before your next appointment, please call your pharmacy*  Lab Work: FASTING MONDAY If you have labs (blood work) drawn today and your tests are completely normal, you will receive your results only by:    MyChart Message (if you have MyChart) OR  A paper copy in the mail If you have any lab test that is abnormal or we need to change your treatment, we will call you to review the results.  Follow-Up: At [facility], you and your health needs are our priority.  As part of our continuing mission to provide you with exceptional heart care, we have created designated Provider Care Teams.  These Care Teams include your primary Cardiologist (physician) and Advanced Practice Providers (APPs -  Physician Assistants and Nurse Practitioners) who all work together to provide you with the care you need, when you need it. To learn more about what you can do with MyChart, go to [email or web address].    Your next appointment:   2-3 month(s)  Provider:   Edd Fabian, FNP-C    Other Instructions DECREASE EATING FOODS THAT ARE HIGH IN ACID FRIED FOODS, TOMATOES AND PEPPERMINTS. ZIO XT- Long Term Monitor Instructions  Your physician has requested you wear a ZIO patch monitor for 14 days.  This is a single patch monitor. [employer] supplies one patch monitor per enrollment. Additional stickers are not available. Please do not apply patch if you will be having a Nuclear Stress Test,  Echocardiogram, Cardiac CT, MRI, or Chest Xray during the period you would be wearing the  monitor. The patch cannot be worn during these tests. You cannot remove and re-apply the  ZIO XT patch monitor.  Your ZIO patch monitor will be mailed 3 day USPS to your address on file. It may  take 3-5 days  to receive your monitor after you have been enrolled.  Once you have received your monitor, please review the enclosed instructions. Your monitor  has already been registered assigning a specific monitor serial # to you.  Billing and Patient Assistance Program Information  We have supplied [employer] with any of your insurance information on file for billing purposes. [employer] offers a sliding scale Patient Assistance Program for patients that do not have  insurance, or whose insurance does not completely cover the cost of the ZIO monitor.  You must apply for the Patient Assistance Program to qualify for this discounted rate.  To apply, please call [employer] at [phone], select option 4, select option 2, ask to apply for  Patient Assistance Program. Meredeth Ide will ask your household income, and how many people  are in your household. They will quote your out-of-pocket cost based on that information.  [employer] will also be able to set up a 3-month, interest-free payment plan if needed.  Applying the monitor   Shave hair from upper left chest.  Hold abrader disc by orange tab. Rub abrader in 40 strokes over the upper left chest as  indicated in your monitor instructions.  Clean area with 4 enclosed alcohol pads. Let dry.  Apply patch as indicated in monitor instructions. Patch will be placed under collarbone on left  side of chest with arrow pointing upward.  Rub patch adhesive wings for 2 minutes. Remove white label marked "1". Remove the white  label  marked "2". Rub patch adhesive wings for 2 additional minutes.  While looking in a mirror, press and release button in center of patch. A small green light will  flash 3-4 times. This will be your only indicator that the monitor has been turned on.  Do not shower for the first 24 hours. You may shower after the first 24 hours.  Press the button if you feel a symptom. You will hear a small click. Record Date, Time and  Symptom in  the Patient Logbook.  When you are ready to remove the patch, follow instructions on the last 2 pages of Patient  Logbook. Stick patch monitor onto the last page of Patient Logbook.  Place Patient Logbook in the blue and white box. Use locking tab on box and tape box closed  securely. The blue and white box has prepaid postage on it. Please place it in the mailbox as  soon as possible. Your physician should have your test results approximately 7 days after the  monitor has been mailed back to [facility].  Call [facility] Customer Care at [phone] if you have questions regarding  your ZIO XT patch monitor. Call them immediately if you see an orange light blinking on your  monitor.  If your monitor falls off in less than 4 days, contact our Monitor department at [phone].  If your monitor becomes loose or falls off after 4 days call [employer] at [phone] for  suggestions on securing your monitor

## 2023-09-12 ENCOUNTER — Ambulatory Visit: Payer: Managed Care, Other (non HMO) | Admitting: General Practice

## 2023-09-29 ENCOUNTER — Other Ambulatory Visit: Payer: Self-pay | Admitting: Hematology and Oncology

## 2023-09-29 ENCOUNTER — Telehealth: Payer: Self-pay | Admitting: Hematology and Oncology

## 2023-09-29 NOTE — Telephone Encounter (Signed)
Received call from pt. She states she has not been in to see Dr. Leonides Schanz in awhile and feels like her iron levels are low. She has a h/o fibroids and has had a lengthy menstrual cycle (4 weeks)  Advised that she has not been here in 2 years. Advised that we will get her lab and clinic appts scheduled as soon as possible Iron refilled per pt request  Dr. Leonides Schanz aware.  Scheduling message sent-high priority

## 2023-10-02 ENCOUNTER — Telehealth: Payer: Self-pay | Admitting: Hematology and Oncology

## 2023-10-03 ENCOUNTER — Other Ambulatory Visit: Payer: Self-pay

## 2023-10-04 ENCOUNTER — Other Ambulatory Visit: Payer: Self-pay | Admitting: Hematology and Oncology

## 2023-10-04 ENCOUNTER — Other Ambulatory Visit: Payer: Self-pay

## 2023-10-04 ENCOUNTER — Inpatient Hospital Stay: Payer: Managed Care, Other (non HMO) | Attending: Nurse Practitioner

## 2023-10-04 DIAGNOSIS — E611 Iron deficiency: Secondary | ICD-10-CM | POA: Diagnosis present

## 2023-10-04 DIAGNOSIS — D500 Iron deficiency anemia secondary to blood loss (chronic): Secondary | ICD-10-CM

## 2023-10-04 LAB — CMP (CANCER CENTER ONLY)
ALT: 11 U/L (ref 0–44)
AST: 14 U/L — ABNORMAL LOW (ref 15–41)
Albumin: 3.9 g/dL (ref 3.5–5.0)
Alkaline Phosphatase: 69 U/L (ref 38–126)
Anion gap: 7 (ref 5–15)
BUN: 14 mg/dL (ref 6–20)
CO2: 24 mmol/L (ref 22–32)
Calcium: 9.1 mg/dL (ref 8.9–10.3)
Chloride: 109 mmol/L (ref 98–111)
Creatinine: 0.99 mg/dL (ref 0.44–1.00)
GFR, Estimated: >60 mL/min (ref >60)
Glucose, Bld: 151 mg/dL — ABNORMAL HIGH (ref 70–99)
Potassium: 3.9 mmol/L (ref 3.5–5.1)
Sodium: 140 mmol/L (ref 135–145)
Total Bilirubin: 0.4 mg/dL (ref <1.2)
Total Protein: 7 g/dL (ref 6.5–8.1)

## 2023-10-04 LAB — RETIC PANEL
Immature Retic Fract: 11.9 % (ref 2.3–15.9)
RBC.: 4.22 MIL/uL (ref 3.87–5.11)
Retic Count, Absolute: 36.7 10*3/uL (ref 19.0–186.0)
Retic Ct Pct: 0.9 % (ref 0.4–3.1)
Reticulocyte Hemoglobin: 30.2 pg (ref >27.9)

## 2023-10-04 LAB — CBC WITH DIFFERENTIAL (CANCER CENTER ONLY)
Abs Immature Granulocytes: 0.01 10*3/uL (ref 0.00–0.07)
Basophils Absolute: 0 10*3/uL (ref 0.0–0.1)
Basophils Relative: 0 %
Eosinophils Absolute: 0.1 10*3/uL (ref 0.0–0.5)
Eosinophils Relative: 1 %
HCT: 37 % (ref 36.0–46.0)
Hemoglobin: 12.7 g/dL (ref 12.0–15.0)
Immature Granulocytes: 0 %
Lymphocytes Relative: 43 %
Lymphs Abs: 2.1 10*3/uL (ref 0.7–4.0)
MCH: 30.2 pg (ref 26.0–34.0)
MCHC: 34.3 g/dL (ref 30.0–36.0)
MCV: 88.1 fL (ref 80.0–100.0)
Monocytes Absolute: 0.4 10*3/uL (ref 0.1–1.0)
Monocytes Relative: 9 %
Neutro Abs: 2.3 10*3/uL (ref 1.7–7.7)
Neutrophils Relative %: 47 %
Platelet Count: 251 10*3/uL (ref 150–400)
RBC: 4.2 MIL/uL (ref 3.87–5.11)
RDW: 12.9 % (ref 11.5–15.5)
WBC Count: 4.9 10*3/uL (ref 4.0–10.5)
nRBC: 0 % (ref 0.0–0.2)

## 2023-10-04 LAB — FERRITIN: Ferritin: 6 ng/mL — ABNORMAL LOW (ref 11–307)

## 2023-10-04 LAB — IRON AND IRON BINDING CAPACITY (CC-WL,HP ONLY)
Iron: 33 ug/dL (ref 28–170)
Saturation Ratios: 7 % — ABNORMAL LOW (ref 10.4–31.8)
TIBC: 503 ug/dL — ABNORMAL HIGH (ref 250–450)
UIBC: 470 ug/dL — ABNORMAL HIGH (ref 148–442)

## 2023-10-09 NOTE — Progress Notes (Unsigned)
Patient Care Team: Lewis Moccasin, MD as PCP - General (Family Medicine) Rollene Rotunda, MD as PCP - Cardiology (Cardiology) Duke Salvia, MD as PCP - Electrophysiology (Cardiology) Boykin Reaper, MD as Consulting Physician (Otolaryngology) Jodelle Gross, NP as Nurse Practitioner (Cardiology) Van Clines, MD as Consulting Physician (Neurology) Voncille Lo, FNP (Family Medicine) Rollene Rotunda, MD as Consulting Physician (Cardiology)   CHIEF COMPLAINT: Follow up IDA  CURRENT THERAPY: Oral iron with IV Iron PRN  INTERVAL HISTORY Ms. Battles returns for follow up. Last seen by Dr. Leonides Schanz 10/12/21 who recommended IV iron but she declined and stopped ferrous sulfate oral iron soon after that visit due to side effects (nausea and cramping).  She continues to have heavy menstrual bleeding, LMP just ended and she bled heavily for 4 weeks with clots.  She is now on progesterone and oral birth control and the bleeding has stopped.  Labs 10/04/23 which showed normal hgb and retic, ferritin 6, TIBC 503, 7% saturation.  He feels exhausted, works 2 jobs which is part of it.  Also notes dark circles under her eyes and just feels like iron is low.  Takes PPI for esophagitis.  Had some GI bleeding last year from a hemorrhoid but none recently.  ROS  All other systems reviewed and negative  Past Medical History:  Diagnosis Date   Anxiety    Back pain    Chest pain    a. 09/2014 Echo: EF 60-65%, no rwma, nl valves;  b. 12/2014 Neg ETT.   Essential hypertension    a. 01/2016 Renal duplex: no RAS- ? renal cyst (renal u/s 4/17 - no cyst, nl study); b. 02/2016 24 hr BP Monitor: Mean SBP ~ 138 with Max of 198. Highest pressures noted in evening hours following placement of cuff.   Fibroid uterus    GERD (gastroesophageal reflux disease)    Hiatal hernia    Palpitations    a. 09/2014 nl event monitor.     Past Surgical History:  Procedure Laterality Date   69 HOUR PH STUDY  N/A 09/01/2021   Procedure: 24 HOUR PH STUDY;  Surgeon: Tressia Danas, MD;  Location: [facility];  Service: Gastroenterology;  Laterality: N/A;   ESOPHAGEAL MANOMETRY N/A 09/01/2021   Procedure: ESOPHAGEAL MANOMETRY (EM);  Surgeon: Tressia Danas, MD;  Location: [facility];  Service: Gastroenterology;  Laterality: N/A;   ESOPHAGOGASTRODUODENOSCOPY  01/2020   WISDOM TOOTH EXTRACTION       Outpatient Encounter Medications as of 10/10/2023  Medication Sig Note   acetaminophen (TYLENOL) 500 MG tablet Take 500 mg by mouth every 6 (six) hours as needed (back pain/ cramps).  09/08/2023: Pt takes as needed.   atenolol (TENORMIN) 25 MG tablet Take 1 tablet by mouth twice daily 09/08/2023: Pt takes 1/2 In the morning quarter at night    Azelastine HCl 137 MCG/SPRAY SOLN Place 1 spray into both nostrils 2 (two) times daily as needed (allergies). 09/08/2023: As needed.   blood glucose meter kit and supplies Test blood sugar twice daily. Dx code: R73.09    cholecalciferol (VITAMIN D3) 25 MCG (1000 UNIT) tablet Take 1,000 Units by mouth daily.    docusate sodium (COLACE) 50 MG capsule Take 50 mg by mouth daily. 09/08/2023: As needed.   famotidine (PEPCID) 20 MG tablet Take 1 tablet (20 mg total) by mouth 2 (two) times daily.    Ferrous Sulfate (IRON) 325 (65 Fe) MG TABS Take 1 tablet by mouth once daily with breakfast  glucose blood (ONETOUCH VERIO) test strip TEST BLOOD SUGAR DAILY    ibuprofen (ADVIL) 600 MG tablet Take 600 mg by mouth every 6 (six) hours as needed.    levalbuterol (XOPENEX HFA) 45 MCG/ACT inhaler Inhale 1-2 puffs every 6 hours if needed for shortness of breath or wheezing- rescue inhaler. Use 2 puffs 15 minutes prior to exercise.    LORazepam (ATIVAN) 0.5 MG tablet Take 0.5 tablets (0.25 mg total) by mouth 3 (three) times daily as needed for anxiety. 09/08/2023: AS needed    meclizine (ANTIVERT) 25 MG tablet Take 25 mg by mouth every 8 (eight) hours as needed. 09/08/2023: As needed    meloxicam (MOBIC) 7.5 MG tablet Take 1 tablet (7.5 mg total) by mouth daily.    Multiple Vitamins-Minerals (EQ MULTIVITAMINS ADULT GUMMY) CHEW Chew by mouth 2 (two) times daily.    Norethindrone-Ethinyl Estradiol-Fe Biphas (LO LOESTRIN FE) 1 MG-10 MCG / 10 MCG tablet Take 1 tablet by mouth at bedtime.     ONETOUCH DELICA LANCETS 33G MISC USE TO TEST BLOOD SUGAR DAILY    pantoprazole (PROTONIX) 40 MG tablet Take 1 tablet (40 mg total) by mouth 2 (two) times daily.    Prenatal Vit-Fe Fumarate-FA (PRENATAL MULTIVITAMIN) TABS tablet Take 1 tablet by mouth daily at 12 noon.    progesterone (PROMETRIUM) 200 MG capsule Take 200 mg by mouth daily. Pt is unclear of the dose because medication is at home.    SF 1.1 % GEL dental gel Place 1 Application onto teeth in the morning and at bedtime.    sucralfate (CARAFATE) 1 g tablet Dissolve 1 tablet in a small amount of water to create a slurry and take po QID prn (Patient taking differently: Take 1 g by mouth 4 (four) times daily as needed (ulcers).)    valsartan (DIOVAN) 40 MG tablet Take 60 mg by mouth daily.    vitamin B-12 (CYANOCOBALAMIN) 1000 MCG tablet Take 1,000 mcg by mouth daily.     ketorolac (TORADOL) 10 MG tablet Take 1 tablet (10 mg total) by mouth every 6 (six) hours as needed. (Patient not taking: Reported on 10/10/2023)    nitroGLYCERIN (NITROSTAT) 0.4 MG SL tablet Place 0.4 mg under the tongue every 5 (five) minutes as needed. (Patient not taking: Reported on 09/08/2023)    [DISCONTINUED] azithromycin (ZITHROMAX) 250 MG tablet Take 250-500 mg by mouth as directed. Take 2 tablets on Day 1 Then Take 1 tablet for 4 Days (Patient not taking: Reported on 06/09/2023) 10/30/2022: ABT Start Date 10/26/22.   [DISCONTINUED] budesonide-formoterol (SYMBICORT) 80-4.5 MCG/ACT inhaler Inhale 1-2 puffs into the lungs 2 (two) times daily. (Patient not taking: Reported on 09/08/2023)    [DISCONTINUED] dicyclomine (BENTYL) 20 MG tablet Take 1 tablet (20 mg total) by  mouth 2 (two) times daily.    [DISCONTINUED] famotidine (PEPCID) 40 MG tablet Take 40 mg by mouth 2 (two) times daily as needed.    [DISCONTINUED] HYDROCORTISONE ACE, RECTAL, 30 MG SUPP Place 1 suppository rectally 2 (two) times daily. (Patient not taking: Reported on 09/08/2023)    [DISCONTINUED] methylPREDNISolone (MEDROL DOSEPAK) 4 MG TBPK tablet Take as directed (Patient not taking: Reported on 10/10/2023)    [DISCONTINUED] SUMAtriptan (IMITREX) 100 MG tablet Take by mouth. (Patient not taking: Reported on 09/08/2023)    [DISCONTINUED] traMADol (ULTRAM) 50 MG tablet Take 50 mg by mouth every 6 (six) hours as needed for moderate pain (pain score 4-6). (Patient not taking: Reported on 09/08/2023) 10/30/2022: Hasn't started   No facility-administered  encounter medications on file as of 10/10/2023.     Today's Vitals   10/10/23 0858  PainSc: 7    There is no height or weight on file to calculate BMI.   PHYSICAL EXAM GENERAL:alert, no distress and comfortable SKIN: no rash  EYES: sclera clear NECK: without mass LYMPH:  no palpable cervical or supraclavicular lymphadenopathy  LUNGS: clear with normal breathing effort HEART: regular rate & rhythm, no lower extremity edema ABDOMEN: abdomen soft, non-tender and normal bowel sounds NEURO: alert & oriented x 3 with fluent speech, no focal motor/sensory deficits    CBC    Component Value Date/Time   WBC 4.9 10/04/2023 1104   WBC 7.6 10/29/2022 2329   RBC 4.22 10/04/2023 1104   RBC 4.20 10/04/2023 1104   HGB 12.7 10/04/2023 1104   HGB 14.0 07/02/2018 1838   HCT 37.0 10/04/2023 1104   HCT 42.8 07/02/2018 1838   PLT 251 10/04/2023 1104   PLT 291 07/02/2018 1838   MCV 88.1 10/04/2023 1104   MCV 90.5 10/01/2018 1743   MCV 92 07/02/2018 1838   MCH 30.2 10/04/2023 1104   MCHC 34.3 10/04/2023 1104   RDW 12.9 10/04/2023 1104   RDW 13.1 07/02/2018 1838   LYMPHSABS 2.1 10/04/2023 1104   LYMPHSABS 3.4 (H) 10/25/2016 1842   MONOABS 0.4  10/04/2023 1104   EOSABS 0.1 10/04/2023 1104   EOSABS 0.0 10/25/2016 1842   BASOSABS 0.0 10/04/2023 1104   BASOSABS 0.0 10/25/2016 1842     CMP     Component Value Date/Time   NA 140 10/04/2023 1104   NA 140 04/24/2019 1651   K 3.9 10/04/2023 1104   CL 109 10/04/2023 1104   CO2 24 10/04/2023 1104   GLUCOSE 151 (H) 10/04/2023 1104   BUN 14 10/04/2023 1104   BUN 12 04/24/2019 1651   CREATININE 0.99 10/04/2023 1104   CREATININE 1.10 06/08/2016 1802   CALCIUM 9.1 10/04/2023 1104   PROT 7.0 10/04/2023 1104   PROT 7.4 10/01/2018 1729   ALBUMIN 3.9 10/04/2023 1104   ALBUMIN 4.5 10/01/2018 1729   AST 14 (L) 10/04/2023 1104   ALT 11 10/04/2023 1104   ALKPHOS 69 10/04/2023 1104   BILITOT 0.4 10/04/2023 1104   GFRNONAA >60 10/04/2023 1104   GFRAA >60 06/04/2020 1430     ASSESSMENT & PLAN: 47 yo female with   Iron deficiency without anemia -Secondary to menorrhagia, tried oral iron intermittently in the past but did not have robust response and developed side effects (nausea, cramping) -Last seen by Dr. Leonides Schanz 2022 and recommended IV iron but pt declined due to fear of severe allergic reaction. Has been off oral iron since then -Ms. Brooke Turner appears stable. She has recurrent symptoms of iron deficiency in the setting of prolonged heavy menstrual bleeding. Labs c/w iron deficiency without anemia. Bleeding currently being managed by ob/gyn. -We discussed iron therapy including oral vs IV, logistics, potential risk, benefit, and SEs.  -Ultimately decided to try 1 month oral iron, with vit C source, separate from PPI/antacids  -Will check celiac panel today to gauge ability to absorb oral iron -If she has not tolerated or responded after lab in 1 month, will proceed with IV iron (she prefers lower dose venofer 200 mg in multiple infusions rather than large dose monoferric).   -I also recommend to proceed with colonoscopy, given that she is 47, for colon cancer screening and to r/o  GI bleeding source. She agrees.  -Will monitor lab closely and f/up  in 6 months   Family history of thalassemia  -Hgb fractionation was normal, and alpha thalassemia mutation was negative    PLAN: -Labs reviewed, iron deficiency without anemia  -Celiac panel today, to determine ability to absorb oral iron  -Restart oral iron (will try slow Fe or liquid, whatever formulation she can tolerate) po once daily with vit C source, separate at least 2 hours from antacid/PPI. She is motivated to take daily consistently  -Lab in 4 weeks to check iron level, if no improvement or intolerance, will proceed with IV iron Venofer 200 mg  -Lab q2 months and f/up in 6 months, or sooner if needed -Proceed with colonoscopy for routine screening and r/o GI bleeding source  Orders Placed This Encounter  Procedures   Celiac panel 10    Standing Status:   Future    Number of Occurrences:   1    Standing Expiration Date:   10/09/2024   CBC with Differential (Cancer Center Only)    Standing Status:   Standing    Number of Occurrences:   10    Standing Expiration Date:   10/09/2024   Ferritin    Standing Status:   Standing    Number of Occurrences:   10    Standing Expiration Date:   10/09/2024   Iron and Iron Binding Capacity ([facility] only)    Standing Status:   Standing    Number of Occurrences:   10    Standing Expiration Date:   10/09/2024      All questions were answered. The patient knows to call the clinic with any problems, questions or concerns. No barriers to learning were detected. I spent 20 minutes counseling the patient face to face. The total time spent in the appointment was 30 minutes and more than 50% was on counseling, review of test results, and coordination of care.   Santiago Glad, NP-C 10/10/2023

## 2023-10-10 ENCOUNTER — Inpatient Hospital Stay: Payer: Managed Care, Other (non HMO)

## 2023-10-10 ENCOUNTER — Inpatient Hospital Stay: Payer: Managed Care, Other (non HMO) | Attending: Nurse Practitioner | Admitting: Nurse Practitioner

## 2023-10-10 ENCOUNTER — Encounter: Payer: Self-pay | Admitting: Nurse Practitioner

## 2023-10-10 DIAGNOSIS — N920 Excessive and frequent menstruation with regular cycle: Secondary | ICD-10-CM | POA: Insufficient documentation

## 2023-10-10 DIAGNOSIS — E611 Iron deficiency: Secondary | ICD-10-CM | POA: Insufficient documentation

## 2023-10-10 DIAGNOSIS — D500 Iron deficiency anemia secondary to blood loss (chronic): Secondary | ICD-10-CM | POA: Diagnosis not present

## 2023-10-11 LAB — CELIAC PANEL 10
Deamidated Gliadin Abs, IgA: 6 U (ref 0–19)
Deamidated Gliadin Abs, IgG: 6 U (ref 0–19)
Endomysial Ab, IgA: NEGATIVE
IgA: 230 mg/dL (ref 87–352)
Tissue Transglutaminase Ab, IgA: <2 U/mL (ref 0–3)
t-Transglutaminase (tTG) IgG: <2 U/mL (ref 0–5)

## 2023-10-12 ENCOUNTER — Ambulatory Visit: Payer: Managed Care, Other (non HMO) | Admitting: Nurse Practitioner

## 2023-10-13 ENCOUNTER — Telehealth: Payer: Self-pay

## 2023-10-13 NOTE — Telephone Encounter (Signed)
Pt advised of lab results with VU.  She has started the oral iron but has not been taking a vit C supplement.  She cannot drink OJ because of the acid so pt was encouraged to get an OTC vit C supplement tablet.

## 2023-10-13 NOTE — Telephone Encounter (Signed)
-----   Message from Pollyann Samples sent at 10/13/2023  8:15 AM EST ----- Please let pt know celiac panel is negative, she should be able to absorb oral iron with the tips we discussed (with vit C, separate from PPI, etc). See if she has found an oral iron and started that yet.   Thanks, Clayborn Heron NP

## 2023-11-15 ENCOUNTER — Telehealth: Payer: Self-pay | Admitting: Hematology and Oncology

## 2023-11-16 ENCOUNTER — Inpatient Hospital Stay: Payer: Managed Care, Other (non HMO)

## 2023-11-16 ENCOUNTER — Inpatient Hospital Stay: Payer: Managed Care, Other (non HMO) | Attending: Hematology

## 2023-11-16 DIAGNOSIS — D509 Iron deficiency anemia, unspecified: Secondary | ICD-10-CM | POA: Diagnosis present

## 2023-11-16 DIAGNOSIS — D500 Iron deficiency anemia secondary to blood loss (chronic): Secondary | ICD-10-CM

## 2023-11-16 LAB — CBC WITH DIFFERENTIAL (CANCER CENTER ONLY)
Abs Immature Granulocytes: 0.01 10*3/uL (ref 0.00–0.07)
Basophils Absolute: 0 10*3/uL (ref 0.0–0.1)
Basophils Relative: 1 %
Eosinophils Absolute: 0.1 10*3/uL (ref 0.0–0.5)
Eosinophils Relative: 1 %
HCT: 38.6 % (ref 36.0–46.0)
Hemoglobin: 12.8 g/dL (ref 12.0–15.0)
Immature Granulocytes: 0 %
Lymphocytes Relative: 44 %
Lymphs Abs: 2.6 10*3/uL (ref 0.7–4.0)
MCH: 29.6 pg (ref 26.0–34.0)
MCHC: 33.2 g/dL (ref 30.0–36.0)
MCV: 89.1 fL (ref 80.0–100.0)
Monocytes Absolute: 0.6 10*3/uL (ref 0.1–1.0)
Monocytes Relative: 10 %
Neutro Abs: 2.6 10*3/uL (ref 1.7–7.7)
Neutrophils Relative %: 44 %
Platelet Count: 233 10*3/uL (ref 150–400)
RBC: 4.33 MIL/uL (ref 3.87–5.11)
RDW: 13.4 % (ref 11.5–15.5)
WBC Count: 6 10*3/uL (ref 4.0–10.5)
nRBC: 0 % (ref 0.0–0.2)

## 2023-11-16 LAB — IRON AND IRON BINDING CAPACITY (CC-WL,HP ONLY)
Iron: 48 ug/dL (ref 28–170)
Saturation Ratios: 11 % (ref 10.4–31.8)
TIBC: 451 ug/dL — ABNORMAL HIGH (ref 250–450)
UIBC: 403 ug/dL (ref 148–442)

## 2023-11-16 LAB — FERRITIN: Ferritin: 12 ng/mL (ref 11–307)

## 2023-11-22 ENCOUNTER — Ambulatory Visit: Payer: Managed Care, Other (non HMO) | Admitting: General Practice

## 2023-12-28 ENCOUNTER — Ambulatory Visit: Payer: Managed Care, Other (non HMO) | Admitting: General Practice

## 2024-01-04 ENCOUNTER — Inpatient Hospital Stay: Payer: Managed Care, Other (non HMO)

## 2024-01-11 ENCOUNTER — Inpatient Hospital Stay: Payer: Managed Care, Other (non HMO) | Attending: Hematology

## 2024-01-18 ENCOUNTER — Other Ambulatory Visit: Payer: Self-pay | Admitting: *Deleted

## 2024-01-18 DIAGNOSIS — D500 Iron deficiency anemia secondary to blood loss (chronic): Secondary | ICD-10-CM

## 2024-01-26 ENCOUNTER — Other Ambulatory Visit: Payer: Self-pay | Admitting: *Deleted

## 2024-01-26 MED ORDER — IRON 325 (65 FE) MG PO TABS: 1.0000 | ORAL_TABLET | Freq: Every day | ORAL | 3 refills | Status: AC

## 2024-01-29 NOTE — Progress Notes (Deleted)
 Cardiology Office Note:    Date:  01/29/2024   ID:  Brooke Turner, DOB 1976-10-30, MRN [id number]  PCP:  Lewis Moccasin, MD   [facility] Providers Cardiologist:  Rollene Rotunda, MD Cardiology APP:  Jodelle Gross, NP  Electrophysiologist:  Sherryl Manges, MD      Referring MD: Lewis Moccasin, MD   Follow-up for palpitations and preoperative cardiac evaluation  History of Present Illness:    Brooke Turner is a 48 y.o. female with a hx of anxiety, back pain, chest pain (echocardiogram 11/15 EF 60-65%, 2/16 negative ETT.),  Essential hypertension, GERD, and palpitations.   She was last seen by Dr. Antoine Poche on 03/26/2020.  During that time she was seen for follow-up evaluation of her chest pain.  She did not tolerate 60 mg of Imdur.  Her dose was reduced to 15 mg which she tolerated much better.  She was also started on pantoprazole which helped improved her chest discomfort.  She denied shortness of breath, PND, orthopnea, palpitations, presyncope, syncope, lower extremity edema, and weight gain.   She walked in the clinic 08/21/2020 and indicated she had been having episodes of fast heartbeat for several months.  She noticed increased episodes when she had a lot of gas.   She presented to the clinic 08/25/2020 for evaluation and stated over the past several weeks she had noticed increasing episodes of irregular heart rate.  She stated that these episodes come on just prior to belching and subside.  She indicated that she also has a family history of elevated lipoprotein a and was tested.  She was having the results faxed to our office.  She denied use of caffeine and triggers that may cause palpitations.  She also noticed intermittent periods of lightheadedness not associated with palpitations.  We reviewed triggers for palpitations.  I  ordered a 14-day ZIO monitor and fasting lipid panel.   Follow-up was planned for 8 weeks.  Her cardiac event monitor  showed no arrhythmias.  Her symptoms occurred during normal sinus rhythm.  Her event monitor showed normal sinus rhythm, rare atrial ectopy, and no sustained arrhythmias   She presented to the clinic 11/25/2020 for follow-up evaluation stated she had noticed some increased activity intolerance and shortness of breath since last fall.  She had decreased her physical activity and noticed that she was not able to do the physical activity that she was able to do prior to last fall.  We reviewed her cardiac event monitor results and I reassured her that her episodes of palpitations related to increased gas did not show cardiac issues.  She expressed understanding.  I have encouraged her to keep a food log and cut out foods that cause her symptoms/issues. I order an echocardiogram to follow-up on her dyspnea with exertion.   Her follow-up in 3 months was planned, gave her the salty 6 diet sheet, and asked her to increase her physical activity as tolerated.  Her echocardiogram showed normal pumping function, no valvular abnormalities on 01/05/2021.  She was seen by Dr. Antoine Poche for chest pain and palpitations.  She had a CT that did not show PE.  A small hiatal hernia was noted.  She reported that she had occasional episodes of chest discomfort and occasional episodes of palpitations.  She had increased her physical activity slightly.  She denied episodes of new chest pressure arm and neck discomfort.  She was not having any shortness of breath PND orthopnea.  And she denied  presyncope and syncope.  Her coronary calcium score in 2020 was 0.  Due to her lack of change in symptomology no further ischemic evaluation was ordered.  She was given sublingual nitroglycerin for as needed use.  She presented to the clinic 06/22/21 for follow-up evaluation stated she had been noticing intermittent periods of sharp sporadic chest pain.  She reported the chest pain would come on at rest lasts for seconds and then dissipates without  intervention.  She reported that she had lost around 30 pounds in the last 18 months.  We reviewed her previous echocardiogram, coronary calcium scoring, cardiac event monitors, and labs from the emergency department.  She also reported that she had been having occasional periods of palpitations.  I asked her to increase her physical activity as tolerated, drew a TSH and magnesium level, and planned her follow-up in 4 to 6 months.  She was seen and evaluated by Bailey Mech, DNP on 08/26/2022.  She complained of extreme sharp pain in her chest that would last for a few seconds and radiate into her arm.  She describes the feeling as shocking and achy.  She has been seen in the emergency department for similar pain and abdominal cramping in August.  She was ruled out for ACS.  She was concerned about strong family history of heart disease.  She reported that her pain had been occurring daily over the past week but had been intermittent over the last 3 weeks.  She continued to try to lose weight and exercise but was afraid to do so to due to her discomfort.  She denied caffeine use, carbonated soft drinks and gas-producing foods.  Her discomfort would come on mostly while sitting.  It was felt that her discomfort was noncardiac in nature.  She was given reassurance.  Her coronary CTA was reviewed as well as her normal echocardiogram.  She presents to the clinic today for follow-up evaluation and preoperative cardiac evaluation.  She states she continues to work from home and is fairly sedentary.  She does do some yard work.  We reviewed her previous cardiac testing.  She expressed understanding.  She inquires about LP(a).  She also reports that she has started smoking about 4 times per week over the last 4 months.  She attributes this to stress.  She continues to have atypical type chest discomfort at rest.  We reviewed the importance of heart healthy low-sodium diet, maintaining physical activity, and  avoiding tobacco.  She expressed understanding.  Her blood pressure today is slightly elevated at 144/94 and on recheck 140/86.  I will increase her valsartan, order BMP, order LP(a) and fasting lipids with LFTs and plan follow-up in 6 months.     Today she denies chest pain, lower extremity edema, fatigue, palpitations, melena, hematuria, hemoptysis, diaphoresis, weakness, presyncope, syncope, orthopnea, and PND.  Past Medical History:  Diagnosis Date   Anxiety    Back pain    Chest pain    a. 09/2014 Echo: EF 60-65%, no rwma, nl valves;  b. 12/2014 Neg ETT.   Essential hypertension    a. 01/2016 Renal duplex: no RAS- ? renal cyst (renal u/s 4/17 - no cyst, nl study); b. 02/2016 24 hr BP Monitor: Mean SBP ~ 138 with Max of 198. Highest pressures noted in evening hours following placement of cuff.   Fibroid uterus    GERD (gastroesophageal reflux disease)    Hiatal hernia    Palpitations    a. 09/2014 nl event monitor.  Past Surgical History:  Procedure Laterality Date   34 HOUR PH STUDY N/A 09/01/2021   Procedure: 24 HOUR PH STUDY;  Surgeon: Tressia Danas, MD;  Location: [facility];  Service: Gastroenterology;  Laterality: N/A;   ESOPHAGEAL MANOMETRY N/A 09/01/2021   Procedure: ESOPHAGEAL MANOMETRY (EM);  Surgeon: Tressia Danas, MD;  Location: [facility];  Service: Gastroenterology;  Laterality: N/A;   ESOPHAGOGASTRODUODENOSCOPY  01/2020   WISDOM TOOTH EXTRACTION      Current Medications: No outpatient medications have been marked as taking for the 01/30/24 encounter (Appointment) with Ronney Asters, NP.     Allergies:   Penicillins, Tamiflu [oseltamivir phosphate], Tinidazole, and Diltiazem   Social History   Socioeconomic History   Marital status: Single    Spouse name: N/A   Number of children: 0   Years of education: 16   Highest education level: Not on file  Occupational History   Occupation: Nutritional therapist: [employer]  Tobacco  Use   Smoking status: Former    Current packs/day: 0.00    Average packs/day: 0.3 packs/day for 12.0 years (3.0 ttl pk-yrs)    Types: Cigarettes    Start date: 1999    Quit date: 2011    Years since quitting: 14.2   Smokeless tobacco: Never  Vaping Use   Vaping status: Never Used  Substance and Sexual Activity   Alcohol use: No    Alcohol/week: 0.0 standard drinks of alcohol   Drug use: No   Sexual activity: Not Currently    Partners: Male    Birth control/protection: Pill  Other Topics Concern   Not on file  Social History Narrative   Patient lives at home alone .   Patient works full time at [employer].   Education college.   Right handed.   Caffeine none   Social Drivers of Corporate investment banker Strain: Not on file  Food Insecurity: Not on file  Transportation Needs: Not on file  Physical Activity: Not on file  Stress: Not on file  Social Connections: Unknown (03/18/2022)   Received from [facility], [facility]   Social Network    Social Network: Not on file     Family History: The patient's family history includes Alzheimer's disease in her maternal grandmother; Cancer in her father; Diabetes (age of onset: 4) in her father; Epilepsy in her mother; GER disease in her mother; Heart disease in her maternal grandfather; Heart disease (age of onset: 71) in her father; Hypertension in her father; Seizures in her mother. There is no history of Colon cancer or Breast cancer.  ROS:   Please see the history of present illness.     All other systems reviewed and are negative.   Risk Assessment/Calculations:           Physical Exam:    VS:  There were no vitals taken for this visit.    Wt Readings from Last 3 Encounters:  09/08/23 227 lb 9.6 oz (103.2 kg)  06/09/23 220 lb (99.8 kg)  10/29/22 194 lb (88 kg)     GEN:  Well nourished, well developed in no acute distress HEENT: Normal NECK: No JVD; No carotid bruits LYMPHATICS: No  lymphadenopathy CARDIAC: RRR, no murmurs, rubs, gallops RESPIRATORY:  Clear to auscultation without rales, wheezing or rhonchi  ABDOMEN: Soft, non-tender, non-distended MUSCULOSKELETAL:  No edema; No deformity  SKIN: Warm and dry NEUROLOGIC:  Alert and oriented x 3 PSYCHIATRIC:  Normal affect    EKGs/Labs/Other  Studies Reviewed:    The following studies were reviewed today: Cardiac event monitor 10/26/2020 Normal sinus rhythm Rare atrial ectopy Multiple symptoms of chest pain, fluttering and skipped beats occur during normal sinus rhythm There are no sustained arrhythmias  Echocardiogram 01/05/2021 IMPRESSIONS     1. Left ventricular ejection fraction, by estimation, is 60 to 65%. The  left ventricle has normal function. The left ventricle has no regional  wall motion abnormalities. Left ventricular diastolic parameters were  normal.   2. Right ventricular systolic function is normal. The right ventricular  size is normal. There is normal pulmonary artery systolic pressure.   3. The mitral valve is normal in structure. No evidence of mitral valve  regurgitation. No evidence of mitral stenosis.   4. The aortic valve is tricuspid. Aortic valve regurgitation is not  visualized. Moderate to severe aortic valve stenosis.   5. The inferior vena cava is normal in size with greater than 50%  respiratory variability, suggesting right atrial pressure of 3 mmHg.  Coronary CTA 2020 IMPRESSION: 1. Coronary calcium score of 0. This was 0 percentile for age and sex matched control.   2. Normal coronary origin with right dominance.   3. No evidence of CAD in the LAD or LCx. No CAD in the visualized portions of the RCA but cannot comment on the PDA or PL which were poorly visualized due to motion artifact.  EKG: EKG Interpretation Date/Time:    Ventricular Rate:    PR Interval:    QRS Duration:    QT Interval:    QTC Calculation:   R Axis:      Text Interpretation:       Recent Labs: 10/04/2023: ALT 11; BUN 14; Creatinine 0.99; Potassium 3.9; Sodium 140 11/16/2023: Hemoglobin 12.8; Platelet Count 233  Recent Lipid Panel    Component Value Date/Time   CHOL 169 08/25/2020 1221   TRIG 86 08/25/2020 1221   HDL 51 08/25/2020 1221   CHOLHDL 3.3 08/25/2020 1221   LDLCALC 102 (H) 08/25/2020 1221   LDLDIRECT 101 (H) 10/25/2016 1850    ASSESSMENT & PLAN   Chest pain/precordial pain- no pain today.  Continues to notice brief intermittent periods of sharp chest pain that radiated to her arm.  Coronary CTA and echocardiograms have been reassuring.  Pain does not appear to be cardiac in nature.  Previously placed on 60 mg of Imdur which she did not tolerate.   Continue to monitor Heart healthy low-sodium diet-reviewed Maintain physical activity  Palpitations-EKG today shows sinus rhythm 69 bpm.  Continue atenolol Avoid triggers caffeine, chocolate, EtOH etc. Continue to monitor  Essential hypertension-BP today 140/86. Maintain blood pressure log increase valsartan to 60 mg daily Bmp in 2 wks  Hyperlipidemia-08/25/2020: Cholesterol, Total 169; HDL 51; LDL Chol Calc (NIH) 102; Triglycerides 86  Heart healthy low-sodium high-fiber diet Maintain physical activity Repeat fasting lipids , LPa in 2 weeks   Tobacco use-over the past 4 months has started smoking about 4 times per week.  Smoking cessation strongly recommended Smoking cessation information given  Preoperative cardiac evaluation-colonoscopy, [facility], Dr. Charlott Rakes    Primary Cardiologist: Rollene Rotunda, MD  Chart reviewed as part of pre-operative protocol coverage. Given past medical history and time since last visit, based on ACC/AHA guidelines, Mariela E Felice-Jennings would be at acceptable risk for the planned procedure without further cardiovascular testing.   Patient was advised that if she develops new symptoms prior to surgery to contact our office to arrange a follow-up  appointment.  She verbalized understanding.        Disposition: Follow-up with Dr. Antoine Poche in  6 months.        Medication Adjustments/Labs and Tests Ordered: Current medicines are reviewed at length with the patient today.  Concerns regarding medicines are outlined above.  No orders of the defined types were placed in this encounter.  No orders of the defined types were placed in this encounter.      Signed, Ronney Asters, NP  01/29/2024 10:47 AM      Notice: This dictation was prepared with Dragon dictation along with smaller phrase technology. Any transcriptional errors that result from this process are unintentional and may not be corrected upon review.  I spent 15 ***minutes examining this patient, reviewing medications, and using patient centered shared decision making involving her cardiac care.  Prior to her visit I spent greater than 20 minutes reviewing her past medical history,  medications, and prior cardiac tests.

## 2024-01-30 ENCOUNTER — Other Ambulatory Visit

## 2024-01-30 ENCOUNTER — Ambulatory Visit: Payer: Managed Care, Other (non HMO) | Admitting: Internal Medicine

## 2024-01-30 ENCOUNTER — Ambulatory Visit: Payer: Managed Care, Other (non HMO) | Admitting: General Practice

## 2024-01-30 ENCOUNTER — Encounter: Payer: Self-pay | Admitting: Internal Medicine

## 2024-01-30 VITALS — BP 158/80 | HR 87 | Temp 98.2°F | Ht 60.0 in | Wt 225.0 lb

## 2024-01-30 DIAGNOSIS — R0789 Other chest pain: Secondary | ICD-10-CM

## 2024-01-30 DIAGNOSIS — D500 Iron deficiency anemia secondary to blood loss (chronic): Secondary | ICD-10-CM

## 2024-01-30 DIAGNOSIS — J45990 Exercise induced bronchospasm: Secondary | ICD-10-CM

## 2024-01-30 DIAGNOSIS — E119 Type 2 diabetes mellitus without complications: Secondary | ICD-10-CM

## 2024-01-30 DIAGNOSIS — I10 Essential (primary) hypertension: Secondary | ICD-10-CM

## 2024-01-30 DIAGNOSIS — K219 Gastro-esophageal reflux disease without esophagitis: Secondary | ICD-10-CM

## 2024-01-30 DIAGNOSIS — J310 Chronic rhinitis: Secondary | ICD-10-CM

## 2024-01-30 DIAGNOSIS — F410 Panic disorder [episodic paroxysmal anxiety] without agoraphobia: Secondary | ICD-10-CM

## 2024-01-30 DIAGNOSIS — F064 Anxiety disorder due to known physiological condition: Secondary | ICD-10-CM

## 2024-01-30 NOTE — Progress Notes (Unsigned)
   Subjective:   Patient ID: Brooke Turner, female    DOB: 01-18-1976, 48 y.o.   MRN: [id number]  HPI The patient is a 48 YO female coming in for ongoing care and having some atypical chest pain.  PMH, [facility], social history reviewed and updated  Review of Systems  Objective:  Physical Exam  Vitals:   01/30/24 1607  BP: (!) 158/80  Pulse: 87  Temp: 98.2 F (36.8 C)  TempSrc: Oral  SpO2: 98%  Weight: 225 lb (102.1 kg)  Height: 5' (1.524 m)    Assessment & Plan:

## 2024-01-30 NOTE — Patient Instructions (Addendum)
 We will check the labs today.  We can figure out the best strategy for the weight once the labs are back.

## 2024-01-31 ENCOUNTER — Encounter: Payer: Self-pay | Admitting: Internal Medicine

## 2024-01-31 LAB — COMPREHENSIVE METABOLIC PANEL
AG Ratio: 1.5 (calc) (ref 1.0–2.5)
ALT: 15 U/L (ref 6–29)
AST: 15 U/L (ref 10–35)
Albumin: 4.1 g/dL (ref 3.6–5.1)
Alkaline phosphatase (APISO): 73 U/L (ref 31–125)
BUN: 19 mg/dL (ref 7–25)
CO2: 25 mmol/L (ref 20–32)
Calcium: 9 mg/dL (ref 8.6–10.2)
Chloride: 106 mmol/L (ref 98–110)
Creat: 0.92 mg/dL (ref 0.50–0.99)
Globulin: 2.7 g/dL (ref 1.9–3.7)
Glucose, Bld: 91 mg/dL (ref 65–99)
Potassium: 4.2 mmol/L (ref 3.5–5.3)
Sodium: 139 mmol/L (ref 135–146)
Total Bilirubin: 0.4 mg/dL (ref 0.2–1.2)
Total Protein: 6.8 g/dL (ref 6.1–8.1)
eGFR: 77 mL/min/{1.73_m2} (ref >=60)

## 2024-01-31 LAB — CBC
HCT: 39.7 % (ref 35.0–45.0)
Hemoglobin: 13.5 g/dL (ref 11.7–15.5)
MCH: 30.4 pg (ref 27.0–33.0)
MCHC: 34 g/dL (ref 32.0–36.0)
MCV: 89.4 fL (ref 80.0–100.0)
MPV: 9.8 fL (ref 7.5–12.5)
Platelets: 263 10*3/uL (ref 140–400)
RBC: 4.44 10*6/uL (ref 3.80–5.10)
RDW: 13.1 % (ref 11.0–15.0)
WBC: 7 10*3/uL (ref 3.8–10.8)

## 2024-01-31 LAB — MICROALBUMIN / CREATININE URINE RATIO
Creatinine, Urine: 71 mg/dL (ref 20–275)
Microalb Creat Ratio: 3 mg/g{creat} (ref <30)
Microalb, Ur: 0.2 mg/dL

## 2024-01-31 LAB — HEMOGLOBIN A1C
Hgb A1c MFr Bld: 6.9 %{Hb} — ABNORMAL HIGH (ref <5.7)
Mean Plasma Glucose: 151 mg/dL
eAG (mmol/L): 8.4 mmol/L

## 2024-01-31 LAB — LIPID PANEL
Cholesterol: 185 mg/dL (ref <200)
HDL: 67 mg/dL (ref >=50)
LDL Cholesterol (Calc): 101 mg/dL — ABNORMAL HIGH
Non-HDL Cholesterol (Calc): 118 mg/dL (ref <130)
Total CHOL/HDL Ratio: 2.8 (calc) (ref <5.0)
Triglycerides: 82 mg/dL (ref <150)

## 2024-01-31 NOTE — Assessment & Plan Note (Signed)
 Taking protonix and seeing GI intermittently.

## 2024-01-31 NOTE — Assessment & Plan Note (Signed)
 BMI 43 and she is looking to work on weight loss. She previously had been prescribed mounjaro but did not start this. We are checking HgA1c and will proceed with appropriate treatment from there.

## 2024-01-31 NOTE — Assessment & Plan Note (Signed)
 Not currently on controlling medication but has lorazepam prn on her medication list. We did not discuss in depth how often she is needing this due to other discussions.

## 2024-01-31 NOTE — Assessment & Plan Note (Signed)
 BP mildly elevated today but previously at goal at other visits likely due to anxiety of meeting new provider. Continue valsartan for now she is taking 60 mg daily and atenolol dosing maybe 1/2 pill morning and 1/4 pill night time.

## 2024-01-31 NOTE — Assessment & Plan Note (Signed)
 Checking HgA1c, lipid panel, microalbumin to creatinine urine ratio and CMP. Not on medication for this currently. She has previously been prescribed mounjaro but did not start.

## 2024-01-31 NOTE — Assessment & Plan Note (Signed)
 Has xopenex but does not use as this makes her anxious. No flare today.

## 2024-01-31 NOTE — Assessment & Plan Note (Signed)
 Taking iron and checking CBC today.

## 2024-01-31 NOTE — Assessment & Plan Note (Signed)
 We decided to proceed with cardiac scoring test. She has had elevated LPA in the past and wishes to assess her personal risk for cardiac disease.

## 2024-01-31 NOTE — Assessment & Plan Note (Signed)
 Using azelastine as needed.

## 2024-03-05 ENCOUNTER — Telehealth: Payer: Self-pay | Admitting: Hematology and Oncology

## 2024-03-06 ENCOUNTER — Inpatient Hospital Stay: Payer: Managed Care, Other (non HMO)

## 2024-03-19 ENCOUNTER — Other Ambulatory Visit: Payer: Self-pay | Admitting: Physician Assistant

## 2024-03-19 DIAGNOSIS — D500 Iron deficiency anemia secondary to blood loss (chronic): Secondary | ICD-10-CM

## 2024-03-20 ENCOUNTER — Other Ambulatory Visit: Payer: Self-pay | Admitting: *Deleted

## 2024-03-20 ENCOUNTER — Inpatient Hospital Stay: Attending: Hematology

## 2024-03-20 DIAGNOSIS — R300 Dysuria: Secondary | ICD-10-CM

## 2024-03-20 DIAGNOSIS — D500 Iron deficiency anemia secondary to blood loss (chronic): Secondary | ICD-10-CM

## 2024-03-20 DIAGNOSIS — D509 Iron deficiency anemia, unspecified: Secondary | ICD-10-CM | POA: Diagnosis present

## 2024-03-20 LAB — CBC WITH DIFFERENTIAL (CANCER CENTER ONLY)
Abs Immature Granulocytes: 0.01 10*3/uL (ref 0.00–0.07)
Basophils Absolute: 0 10*3/uL (ref 0.0–0.1)
Basophils Relative: 0 %
Eosinophils Absolute: 0.1 10*3/uL (ref 0.0–0.5)
Eosinophils Relative: 1 %
HCT: 38.8 % (ref 36.0–46.0)
Hemoglobin: 13.1 g/dL (ref 12.0–15.0)
Immature Granulocytes: 0 %
Lymphocytes Relative: 42 %
Lymphs Abs: 3.6 10*3/uL (ref 0.7–4.0)
MCH: 30.3 pg (ref 26.0–34.0)
MCHC: 33.8 g/dL (ref 30.0–36.0)
MCV: 89.6 fL (ref 80.0–100.0)
Monocytes Absolute: 0.8 10*3/uL (ref 0.1–1.0)
Monocytes Relative: 9 %
Neutro Abs: 4.1 10*3/uL (ref 1.7–7.7)
Neutrophils Relative %: 48 %
Platelet Count: 246 10*3/uL (ref 150–400)
RBC: 4.33 MIL/uL (ref 3.87–5.11)
RDW: 12.9 % (ref 11.5–15.5)
WBC Count: 8.6 10*3/uL (ref 4.0–10.5)
nRBC: 0 % (ref 0.0–0.2)

## 2024-03-20 LAB — URINALYSIS, COMPLETE (UACMP) WITH MICROSCOPIC
Bacteria, UA: NONE SEEN
Bilirubin Urine: NEGATIVE
Glucose, UA: NEGATIVE mg/dL
Ketones, ur: NEGATIVE mg/dL
Leukocytes,Ua: NEGATIVE
Nitrite: NEGATIVE
Protein, ur: NEGATIVE mg/dL
Specific Gravity, Urine: 1.015 (ref 1.005–1.030)
pH: 5 (ref 5.0–8.0)

## 2024-03-20 LAB — IRON AND IRON BINDING CAPACITY (CC-WL,HP ONLY)
Iron: 86 ug/dL (ref 28–170)
Saturation Ratios: 19 % (ref 10.4–31.8)
TIBC: 461 ug/dL — ABNORMAL HIGH (ref 250–450)
UIBC: 375 ug/dL

## 2024-03-21 ENCOUNTER — Telehealth: Payer: Self-pay | Admitting: *Deleted

## 2024-03-21 ENCOUNTER — Other Ambulatory Visit: Payer: Self-pay | Admitting: *Deleted

## 2024-03-21 ENCOUNTER — Telehealth: Payer: Self-pay | Admitting: Hematology and Oncology

## 2024-03-21 LAB — URINE CULTURE

## 2024-03-21 LAB — FERRITIN: Ferritin: 43 ng/mL (ref 11–307)

## 2024-03-21 MED ORDER — PHENAZOPYRIDINE HCL 100 MG PO TABS: 100.0000 mg | ORAL_TABLET | Freq: Three times a day (TID) | ORAL | 0 refills | Status: DC | PRN

## 2024-03-21 NOTE — Telephone Encounter (Signed)
 TCT patient regarding lab results. Spoke with her. Advised that she is not anemic and her Iron  levels have improved.Advised that she is to continue her oral iron . Advised that her u/a was negative for a UTI but we are waiting on the the culture report which would be in in another day or two. If she continue to have urinary symptoms, advised to follow up with her PCP. Pt voiced understanding.

## 2024-03-29 ENCOUNTER — Ambulatory Visit: Payer: Self-pay | Admitting: *Deleted

## 2024-03-29 NOTE — Telephone Encounter (Signed)
-----   Message from Darilyn Edin sent at 03/25/2024 11:07 AM EDT ----- Culture was nondiagnostic. If she is still having symptoms, we need to recollect the urine sample. ----- Message ----- From: Interface, Lab In [place] Sent: 03/20/2024   4:30 PM EDT To: Darilyn Edin, PA-C

## 2024-03-29 NOTE — Telephone Encounter (Signed)
 TCT patient regarding u/a results. Advised that the culture was inconclusive and that the lab recommended another urine sample be obtianed. Pt states she is on her menstrual cycle currently but should be done by Monday. Advised to call on Tuesday,04/02/24 to tell us  know when she can come in for another urine sample. She states she feels maybe some pressure but since she is on her cycle she isn't sure if its that or her bladder. Denbeis fever, chills, or odorous urine.

## 2024-04-10 ENCOUNTER — Inpatient Hospital Stay

## 2024-04-10 ENCOUNTER — Inpatient Hospital Stay: Admitting: Physician Assistant

## 2024-04-11 ENCOUNTER — Other Ambulatory Visit: Payer: Self-pay | Admitting: Obstetrics & Gynecology

## 2024-04-11 DIAGNOSIS — Z1231 Encounter for screening mammogram for malignant neoplasm of breast: Secondary | ICD-10-CM

## 2024-04-15 ENCOUNTER — Other Ambulatory Visit: Payer: Self-pay

## 2024-04-15 DIAGNOSIS — I8393 Asymptomatic varicose veins of bilateral lower extremities: Secondary | ICD-10-CM

## 2024-04-17 ENCOUNTER — Inpatient Hospital Stay: Payer: Managed Care, Other (non HMO)

## 2024-04-17 ENCOUNTER — Inpatient Hospital Stay: Payer: Managed Care, Other (non HMO) | Admitting: Physician Assistant

## 2024-04-18 ENCOUNTER — Inpatient Hospital Stay: Payer: Managed Care, Other (non HMO)

## 2024-04-18 ENCOUNTER — Inpatient Hospital Stay: Payer: Managed Care, Other (non HMO) | Admitting: Nurse Practitioner

## 2024-04-25 ENCOUNTER — Ambulatory Visit
Admission: RE | Admit: 2024-04-25 | Discharge: 2024-04-25 | Disposition: A | Discharge status: Home / Self Care | Source: Ambulatory Visit | Attending: Obstetrics & Gynecology | Admitting: Obstetrics & Gynecology

## 2024-04-25 DIAGNOSIS — Z1231 Encounter for screening mammogram for malignant neoplasm of breast: Secondary | ICD-10-CM

## 2024-05-02 ENCOUNTER — Ambulatory Visit (HOSPITAL_COMMUNITY)
Admission: RE | Admit: 2024-05-02 | Discharge: 2024-05-02 | Disposition: A | Discharge status: Home / Self Care | Source: Ambulatory Visit | Attending: Surgery | Admitting: Surgery

## 2024-05-02 DIAGNOSIS — I8393 Asymptomatic varicose veins of bilateral lower extremities: Secondary | ICD-10-CM | POA: Diagnosis present

## 2024-05-06 ENCOUNTER — Encounter (HOSPITAL_COMMUNITY)

## 2024-05-08 ENCOUNTER — Inpatient Hospital Stay

## 2024-05-17 ENCOUNTER — Inpatient Hospital Stay: Admitting: Hematology and Oncology

## 2024-05-27 ENCOUNTER — Encounter

## 2024-05-31 ENCOUNTER — Inpatient Hospital Stay

## 2024-06-11 ENCOUNTER — Telehealth: Payer: Self-pay | Admitting: *Deleted

## 2024-06-11 NOTE — Telephone Encounter (Signed)
 Patient called and asked if she should reschedule her lab/MD appts on 8/7 as she has not been consistent with her po iron  due to gastric problems. She said she'd had labs at PCP last week and her ferritin was 14, so maybe she should just get the IV iron  that was ordered for her that she never got around to.  Informed her that there are no current orders for IV iron  in her chart - last orders are from 2023.  Informed her that per Labs here in May 2025 and provider note, she is not anemic. Her last ferritin was 43, highest in past 4 years.  She asked if Dr. Federico could use the labs from her PCP last week and order iron . Advised that Dr. Federico will need to review any labs prior to ordering iv iron , especially since last labs did not indicate anemia. Informed her that her PCP could fax labs to this office for Dr. Lafonda review and offered fax number for her to give to PCP. She declined fax number.   Advised patient that her appts on 06/13/24 could proceed as planned even if she has been inconsistently taking po iron  due to stomach problems. Dr. Federico will review the results and discuss with her.   She asked to reschedule appts. Schedule message

## 2024-06-13 ENCOUNTER — Inpatient Hospital Stay

## 2024-06-13 ENCOUNTER — Inpatient Hospital Stay: Admitting: Hematology and Oncology

## 2024-06-20 ENCOUNTER — Inpatient Hospital Stay (HOSPITAL_BASED_OUTPATIENT_CLINIC_OR_DEPARTMENT_OTHER): Admitting: Hematology and Oncology

## 2024-06-20 ENCOUNTER — Inpatient Hospital Stay: Attending: Hematology

## 2024-06-20 VITALS — BP 129/70 | HR 61 | Temp 97.6°F | Resp 18 | Ht 60.0 in | Wt 224.4 lb

## 2024-06-20 DIAGNOSIS — K5900 Constipation, unspecified: Secondary | ICD-10-CM | POA: Diagnosis not present

## 2024-06-20 DIAGNOSIS — N920 Excessive and frequent menstruation with regular cycle: Secondary | ICD-10-CM | POA: Insufficient documentation

## 2024-06-20 DIAGNOSIS — D500 Iron deficiency anemia secondary to blood loss (chronic): Secondary | ICD-10-CM

## 2024-06-20 DIAGNOSIS — E611 Iron deficiency: Secondary | ICD-10-CM | POA: Insufficient documentation

## 2024-06-20 DIAGNOSIS — Z87891 Personal history of nicotine dependence: Secondary | ICD-10-CM | POA: Insufficient documentation

## 2024-06-20 LAB — CBC WITH DIFFERENTIAL (CANCER CENTER ONLY)
Abs Immature Granulocytes: 0.01 K/uL (ref 0.00–0.07)
Basophils Absolute: 0 K/uL (ref 0.0–0.1)
Basophils Relative: 1 %
Eosinophils Absolute: 0.1 K/uL (ref 0.0–0.5)
Eosinophils Relative: 1 %
HCT: 38.5 % (ref 36.0–46.0)
Hemoglobin: 13.3 g/dL (ref 12.0–15.0)
Immature Granulocytes: 0 %
Lymphocytes Relative: 52 %
Lymphs Abs: 4.1 K/uL — ABNORMAL HIGH (ref 0.7–4.0)
MCH: 30.9 pg (ref 26.0–34.0)
MCHC: 34.5 g/dL (ref 30.0–36.0)
MCV: 89.3 fL (ref 80.0–100.0)
Monocytes Absolute: 0.7 K/uL (ref 0.1–1.0)
Monocytes Relative: 10 %
Neutro Abs: 2.8 K/uL (ref 1.7–7.7)
Neutrophils Relative %: 36 %
Platelet Count: 247 K/uL (ref 150–400)
RBC: 4.31 MIL/uL (ref 3.87–5.11)
RDW: 12.3 % (ref 11.5–15.5)
WBC Count: 7.7 K/uL (ref 4.0–10.5)
nRBC: 0 % (ref 0.0–0.2)

## 2024-06-20 LAB — FERRITIN: Ferritin: 22 ng/mL (ref 11–307)

## 2024-06-20 LAB — IRON AND IRON BINDING CAPACITY (CC-WL,HP ONLY)
Iron: 91 ug/dL (ref 28–170)
Saturation Ratios: 20 % (ref 10.4–31.8)
TIBC: 447 ug/dL (ref 250–450)
UIBC: 356 ug/dL (ref 148–442)

## 2024-06-20 NOTE — Progress Notes (Signed)
 [facility] [phone]   [phone]  PROGRESS NOTE  Patient Care Team: Rollene Almarie LABOR, MD as PCP - General (Internal Medicine) Lavona Agent, MD as PCP - Cardiology (Cardiology) Fernande Elspeth BROCKS, MD as PCP - Electrophysiology (Cardiology) Reese Ra, MD as Consulting Physician (Otolaryngology) Jerilynn Lamarr HERO, NP as Nurse Practitioner (Cardiology) Georjean Darice HERO, MD as Consulting Physician (Neurology) Lin Cathryne PEDLAR, FNP (Family Medicine) Lavona Agent, MD as Consulting Physician (Cardiology)  Hematological/Oncological History # Iron  Deficiency without Anemia  1) 04/30/2020: WBC 5.7, hgb 13.2, MCV 90.6, Plt 283. Iron  52, ferritin 11.7, transferrin 395.  2) 06/04/2020: establish care with Dr. Federico 3) 01/13/2021: Iron  38, TIBC 491, Sat 8%, Ferritin 11    #Family History of Thalassemia 1) Patient's grandfather had thalassemia major with her mom and brother having thalassemia minor  2) Hgb electropharesis negative, though may still carry alpha thalassemia.   Interval History:  Brooke Turner 48 y.o. female with medical history significant for iron  deficiency anemia presents for a follow up visit. The patient's last visit was on 10/10/2023. In the interim since the last visit she has continued PO iron  therapy every other day.   On exam today Mrs. Brooke Turner notes she has been well overall in the interim since our last visit.  She reports that she has been tired more recently and was checked by her PCP and noted to have low iron  levels.  She reports she has been on and off her iron  pills.  She reports that she is having stomach pain, nausea, and cramping and was evaluated by her GI doctor who performed an x-ray which showed moderate stool burden constipation.  She reports that she was started Linzess yesterday and she has been passing a lot of gas and bowel movements.  She reports that she also had a little bit of  fluttering of her heart at 1 point in time.  She notes it lasted for a few seconds but had resolved.  She notes that she is not taking iron  pills due to concern it will worsen her constipation.  She reports that she is interested in taking Floradix but is also open to the idea of IV iron  therapy.  She is having some lightheadedness but no dizziness or shortness of breath.  She denies any runny nose, sore throat, cough.  She is not having any ice cravings.  She reports her menstrual cycles have been unusual as she had been on strong birth control for her fibroids and did not had a menstrual cycle for many months.  Unfortunately she has been changing her birth control regimen and has had some unusual length cycles and periods.  A full 10 point ROS is otherwise negative.  MEDICAL HISTORY:  Past Medical History:  Diagnosis Date   Anxiety    Back pain    Chest pain    a. 09/2014 Echo: EF 60-65%, no rwma, nl valves;  b. 12/2014 Neg ETT.   Essential hypertension    a. 01/2016 Renal duplex: no RAS- ? renal cyst (renal u/s 4/17 - no cyst, nl study); b. 02/2016 24 hr BP Monitor: Mean SBP ~ 138 with Max of 198. Highest pressures noted in evening hours following placement of cuff.   Fibroid uterus    GERD (gastroesophageal reflux disease)    Hiatal hernia    Palpitations    a. 09/2014 nl event monitor.    SURGICAL HISTORY: Past Surgical History:  Procedure Laterality Date   71  HOUR PH STUDY N/A 09/01/2021   Procedure: 24 HOUR PH STUDY;  Surgeon: Eda Iha, MD;  Location: [facility];  Service: Gastroenterology;  Laterality: N/A;   ESOPHAGEAL MANOMETRY N/A 09/01/2021   Procedure: ESOPHAGEAL MANOMETRY (EM);  Surgeon: Eda Iha, MD;  Location: [facility];  Service: Gastroenterology;  Laterality: N/A;   ESOPHAGOGASTRODUODENOSCOPY  01/2020   WISDOM TOOTH EXTRACTION      SOCIAL HISTORY: Social History   Socioeconomic History   Marital status: Single    Spouse name: N/A   Number of  children: 0   Years of education: 16   Highest education level: Not on file  Occupational History   Occupation: Nutritional therapist: [employer]  Tobacco Use   Smoking status: Former    Current packs/day: 0.00    Average packs/day: 0.3 packs/day for 12.0 years (3.0 ttl pk-yrs)    Types: Cigarettes    Start date: 1999    Quit date: 2011    Years since quitting: 14.6   Smokeless tobacco: Never  Vaping Use   Vaping status: Never Used  Substance and Sexual Activity   Alcohol use: No    Alcohol/week: 0.0 standard drinks of alcohol   Drug use: No   Sexual activity: Not Currently    Partners: Male    Birth control/protection: Pill  Other Topics Concern   Not on file  Social History Narrative   Patient lives at home alone .   Patient works full time at [employer].   Education college.   Right handed.   Caffeine none   Social Drivers of Corporate investment banker Strain: Not on file  Food Insecurity: Not on file  Transportation Needs: Not on file  Physical Activity: Not on file  Stress: Not on file  Social Connections: Unknown (03/18/2022)   Received from [facility]   Social Network    Social Network: Not on file  Intimate Partner Violence: Unknown (02/07/2022)   Received from [facility]   HITS    Physically Hurt: Not on file    Insult or Talk Down To: Not on file    Threaten Physical Harm: Not on file    Scream or Curse: Not on file    FAMILY HISTORY: Family History  Problem Relation Age of Onset   Seizures Mother    Epilepsy Mother    GER disease Mother    Cancer Father        Liver   Diabetes Father 73   Hypertension Father    Heart disease Father 59       CEA, LE Stenting   Alzheimer's disease Maternal Grandmother    Heart disease Maternal Grandfather    Colon cancer Neg Hx    Breast cancer Neg Hx     ALLERGIES:  is allergic to penicillins, tamiflu [oseltamivir phosphate], tinidazole, and diltiazem .  MEDICATIONS:  Current  Outpatient Medications  Medication Sig Dispense Refill   acetaminophen  (TYLENOL ) 500 MG tablet Take 500 mg by mouth every 6 (six) hours as needed (back pain/ cramps).      atenolol  (TENORMIN ) 25 MG tablet Take 1 tablet by mouth twice daily 30 tablet 6   Azelastine HCl 137 MCG/SPRAY SOLN Place 1 spray into both nostrils 2 (two) times daily as needed (allergies).     blood glucose meter kit and supplies Test blood sugar twice daily. Dx code: R73.09 1 each 0   cholecalciferol (VITAMIN D3) 25 MCG (1000 UNIT) tablet Take 1,000 Units by mouth  daily.     docusate sodium (COLACE) 50 MG capsule Take 50 mg by mouth daily.     famotidine  (PEPCID ) 20 MG tablet Take 1 tablet (20 mg total) by mouth 2 (two) times daily. 180 tablet 0   Ferrous Sulfate  (IRON ) 325 (65 Fe) MG TABS Take 1 tablet (325 mg total) by mouth daily with breakfast. 90 tablet 3   glucose blood (ONETOUCH VERIO) test strip TEST BLOOD SUGAR DAILY 25 each 10   ibuprofen  (ADVIL ) 600 MG tablet Take 600 mg by mouth every 6 (six) hours as needed.     levalbuterol  (XOPENEX  HFA) 45 MCG/ACT inhaler Inhale 1-2 puffs every 6 hours if needed for shortness of breath or wheezing- rescue inhaler. Use 2 puffs 15 minutes prior to exercise. 15 g 0   linaclotide (LINZESS) 72 MCG capsule Take 72 mcg by mouth daily before breakfast.     LORazepam  (ATIVAN ) 0.5 MG tablet Take 0.5 tablets (0.25 mg total) by mouth 3 (three) times daily as needed for anxiety.     meclizine  (ANTIVERT ) 25 MG tablet Take 25 mg by mouth every 8 (eight) hours as needed.     Multiple Vitamins-Minerals (EQ MULTIVITAMINS ADULT GUMMY) CHEW Chew by mouth 2 (two) times daily.     nitroGLYCERIN  (NITROSTAT ) 0.4 MG SL tablet Place 0.4 mg under the tongue every 5 (five) minutes as needed.     Norethindrone -Ethinyl Estradiol -Fe Biphas (LO LOESTRIN FE) 1 MG-10 MCG / 10 MCG tablet Take 1 tablet by mouth at bedtime.      ONETOUCH DELICA LANCETS 33G MISC USE TO TEST BLOOD SUGAR DAILY 100 each 3    pantoprazole  (PROTONIX ) 40 MG tablet Take 1 tablet (40 mg total) by mouth 2 (two) times daily. 180 tablet 3   Prenatal Vit-Fe Fumarate-FA (PRENATAL MULTIVITAMIN) TABS tablet Take 1 tablet by mouth daily at 12 noon.     progesterone  (PROMETRIUM ) 200 MG capsule Take 200 mg by mouth daily. Pt is unclear of the dose because medication is at home.     SF 1.1 % GEL dental gel Place 1 Application onto teeth in the morning and at bedtime.     sucralfate  (CARAFATE ) 1 g tablet Dissolve 1 tablet in a small amount of water to create a slurry and take po QID prn 120 tablet 3   valsartan (DIOVAN) 40 MG tablet Take 60 mg by mouth daily.     vitamin B-12 (CYANOCOBALAMIN ) 1000 MCG tablet Take 1,000 mcg by mouth daily.      No current facility-administered medications for this visit.    REVIEW OF SYSTEMS:   Constitutional: ( - ) fevers, ( - )  chills , ( - ) night sweats Eyes: ( - ) blurriness of vision, ( - ) double vision, ( - ) watery eyes Ears, nose, mouth, throat, and face: ( - ) mucositis, ( - ) sore throat Respiratory: ( - ) cough, ( - ) dyspnea, ( - ) wheezes Cardiovascular: ( - ) palpitation, ( - ) chest discomfort, ( - ) lower extremity swelling Gastrointestinal:  ( - ) nausea, ( - ) heartburn, ( - ) change in bowel habits Skin: ( - ) abnormal skin rashes Lymphatics: ( - ) new lymphadenopathy, ( - ) easy bruising Neurological: ( - ) numbness, ( - ) tingling, ( - ) new weaknesses Behavioral/Psych: ( - ) mood change, ( - ) new changes  All other systems were reviewed with the patient and are negative.  PHYSICAL EXAMINATION:  Vitals:  06/20/24 0959  BP: 129/70  Pulse: 61  Resp: 18  Temp: 97.6 F (36.4 C)  SpO2: 100%     Filed Weights   06/20/24 0959  Weight: 224 lb 6.4 oz (101.8 kg)      GENERAL: well appearing middle aged [place] American female. alert, no distress and comfortable SKIN: skin color, texture, turgor are normal, no rashes or significant lesions EYES: conjunctiva are  pink and non-injected, sclera clear LUNGS: clear to auscultation and percussion with normal breathing effort HEART: regular rate & rhythm and no murmurs and no lower extremity edema Musculoskeletal: no cyanosis of digits and no clubbing  PSYCH: alert & oriented x 3, fluent speech NEURO: no focal motor/sensory deficits  LABORATORY DATA:  I have reviewed the data as listed    Latest Ref Rng & Units 06/20/2024    9:31 AM 03/20/2024    4:11 PM 01/30/2024    5:00 PM  CBC  WBC 4.0 - 10.5 K/uL 7.7  8.6  7.0   Hemoglobin 12.0 - 15.0 g/dL 86.6  86.8  86.4   Hematocrit 36.0 - 46.0 % 38.5  38.8  39.7   Platelets 150 - 400 K/uL 247  246  263        Latest Ref Rng & Units 01/30/2024    5:00 PM 10/04/2023   11:04 AM 10/29/2022   11:29 PM  CMP  Glucose 65 - 99 mg/dL 91  848  90   BUN 7 - 25 mg/dL 19  14  11    Creatinine 0.50 - 0.99 mg/dL 9.07  9.00  8.91   Sodium 135 - 146 mmol/L 139  140  138   Potassium 3.5 - 5.3 mmol/L 4.2  3.9  3.5   Chloride 98 - 110 mmol/L 106  109  107   CO2 20 - 32 mmol/L 25  24  24    Calcium 8.6 - 10.2 mg/dL 9.0  9.1  8.6   Total Protein 6.1 - 8.1 g/dL 6.8  7.0  7.3   Total Bilirubin 0.2 - 1.2 mg/dL 0.4  0.4  1.0   Alkaline Phos 38 - 126 U/L  69  52   AST 10 - 35 U/L 15  14  25    ALT 6 - 29 U/L 15  11  20      RADIOGRAPHIC STUDIES: No results found.   ASSESSMENT & PLAN Brooke Turner 48 y.o. female with medical history significant for iron  deficiency anemia presents for a follow up visit.   After review the labs, the records, discussion with the patient the findings most consistent with persistent iron  deficiency without anemia.  Her hemoglobin persistently remains above 12, however her ferritin levels and iron  saturation levels are markedly low.  As such I would recommend that she continue p.o. iron  therapy.  The treatment of iron  deficiency without anemia is controversial, however given her symptoms I do believe it is reasonable to pursue p.o. iron   therapy at this time.  In the event that this was ineffective and her symptoms fail to improve we could consider administration of IV iron , though without anemia it is difficult to justify.  I encouraged the patient to increase iron  rich foods in her diet and continue the iron  sulfate pill every other day with a source of vitamin C.  She voiced understanding of the plan moving forward.  # Iron  Deficiency without Anemia 2/2 to GYN Bleeding --treatment of iron  deficiency without anemia is controversial. --Patient currently holding on her p.o.  iron  therapy due to constipation. -- indication for IV iron  at this time as her symptoms have failed to improve and iron  levels have not increased on PO iron  therapy.  We will pursue IV iron  therapy through [facility]. --will check full iron  panel today.  Results pending --Labs today show white blood cell 7.7, hemoglobin 13.3, MCV 89.3, platelets 247 --Patient notes that she is interested in trialing Floradix as she was recently diagnosed with large stool burden and constipation and has been holding off her iron . --RTC in 6 months time.  Can be seen sooner if iron  labs are low or if IV iron  is required.   #Family History of Thalassemia --strong family history of thalassemia, though unclear if Alpha or Beta. Likely Beta based on the description --Hgb electrophoresis showed no evidence of beta thal, sickle cell trait, or alpha thalassemia.   No orders of the defined types were placed in this encounter.   All questions were answered. The patient knows to call the clinic with any problems, questions or concerns.  A total of more than 25 minutes were spent on this encounter and over half of that time was spent on counseling and coordination of care as outlined above.   Norleen IVAR Kidney, MD Department of Hematology/Oncology [facility] Phone: [phone] Pager: [phone] Email: [email or web address]  06/20/2024  10:53 AM

## 2024-07-05 ENCOUNTER — Telehealth: Payer: Self-pay | Admitting: Cardiology

## 2024-07-05 ENCOUNTER — Other Ambulatory Visit: Payer: Self-pay

## 2024-07-05 ENCOUNTER — Emergency Department (HOSPITAL_COMMUNITY)
Admission: EM | Admit: 2024-07-05 | Discharge: 2024-07-06 | Disposition: A | Discharge status: Home / Self Care | Attending: Emergency Medicine | Admitting: Emergency Medicine

## 2024-07-05 ENCOUNTER — Encounter (HOSPITAL_COMMUNITY): Payer: Self-pay

## 2024-07-05 DIAGNOSIS — R0602 Shortness of breath: Secondary | ICD-10-CM | POA: Insufficient documentation

## 2024-07-05 DIAGNOSIS — R0609 Other forms of dyspnea: Secondary | ICD-10-CM

## 2024-07-05 DIAGNOSIS — J45901 Unspecified asthma with (acute) exacerbation: Secondary | ICD-10-CM

## 2024-07-05 DIAGNOSIS — M79605 Pain in left leg: Secondary | ICD-10-CM | POA: Insufficient documentation

## 2024-07-05 LAB — CBC WITH DIFFERENTIAL/PLATELET
Abs Immature Granulocytes: 0.01 K/uL (ref 0.00–0.07)
Basophils Absolute: 0 K/uL (ref 0.0–0.1)
Basophils Relative: 0 %
Eosinophils Absolute: 0.1 K/uL (ref 0.0–0.5)
Eosinophils Relative: 1 %
HCT: 38.2 % (ref 36.0–46.0)
Hemoglobin: 12.8 g/dL (ref 12.0–15.0)
Immature Granulocytes: 0 %
Lymphocytes Relative: 47 %
Lymphs Abs: 3.7 K/uL (ref 0.7–4.0)
MCH: 30.8 pg (ref 26.0–34.0)
MCHC: 33.5 g/dL (ref 30.0–36.0)
MCV: 91.8 fL (ref 80.0–100.0)
Monocytes Absolute: 0.8 K/uL (ref 0.1–1.0)
Monocytes Relative: 10 %
Neutro Abs: 3.3 K/uL (ref 1.7–7.7)
Neutrophils Relative %: 42 %
Platelets: 285 K/uL (ref 150–400)
RBC: 4.16 MIL/uL (ref 3.87–5.11)
RDW: 12.7 % (ref 11.5–15.5)
WBC: 7.9 K/uL (ref 4.0–10.5)
nRBC: 0 % (ref 0.0–0.2)

## 2024-07-05 LAB — BASIC METABOLIC PANEL WITH GFR
Anion gap: 10 (ref 5–15)
BUN: 6 mg/dL (ref 6–20)
CO2: 23 mmol/L (ref 22–32)
Calcium: 8.2 mg/dL — ABNORMAL LOW (ref 8.9–10.3)
Chloride: 104 mmol/L (ref 98–111)
Creatinine, Ser: 1.03 mg/dL — ABNORMAL HIGH (ref 0.44–1.00)
GFR, Estimated: >60 mL/min (ref >60)
Glucose, Bld: 98 mg/dL (ref 70–99)
Potassium: 3.6 mmol/L (ref 3.5–5.1)
Sodium: 137 mmol/L (ref 135–145)

## 2024-07-05 NOTE — Telephone Encounter (Signed)
 Pt c/o swelling/edema: STAT if pt has developed SOB within 24 hours  If swelling, where is the swelling located? Legs and ankles, specially thighs   How much weight have you gained and in what time span? Not sure   Have you gained 2 pounds in a day or 5 pounds in a week? Not sure   Do you have a log of your daily weights (if so, list)? Does not weigh herself   Are you currently taking a fluid pill?  No   Are you currently SOB? Nothing currently but states breathing has been weird lately   Have you traveled recently in a car or plane for an extended period of time? No

## 2024-07-05 NOTE — Telephone Encounter (Signed)
 Spoke with pt who is complaining of painful/soreness/throbbing and some discoloration in left thigh.  Swelling down into ankle, leg is puffy.  Also reports she can feel the pulse at her foot but not up in her leg where it is tight and swollen.  She occasionally has some swelling in right ankle but it resolves overnight.  C/o increased effort in breathing or like a heaviness in chest for the passed couple of days.  States it's just a little weird   Advised to report to closest ED for further evaluation. Advised I will forward this to Dr Lavona for review.  Has f/u appt with Dr Lavona 07/10/24.

## 2024-07-05 NOTE — ED Triage Notes (Signed)
 Pt reports left leg pain x 2 months with swelling x 1 week. Pain is in thigh but radiates down knee. Pt denies specific injury. Pt saw vascular in June with negative venous testing.

## 2024-07-05 NOTE — ED Notes (Signed)
 Pt denies chest pain and/or SOB at this time, but had chest pain earlier today.

## 2024-07-06 ENCOUNTER — Emergency Department (HOSPITAL_COMMUNITY)

## 2024-07-06 ENCOUNTER — Ambulatory Visit (HOSPITAL_COMMUNITY): Admission: RE | Admit: 2024-07-06 | Source: Ambulatory Visit

## 2024-07-06 LAB — D-DIMER, QUANTITATIVE: D-Dimer, Quant: 0.32 ug{FEU}/mL (ref 0.00–0.50)

## 2024-07-06 LAB — BRAIN NATRIURETIC PEPTIDE: B Natriuretic Peptide: 47.9 pg/mL (ref 0.0–100.0)

## 2024-07-06 MED ORDER — LEVALBUTEROL TARTRATE 45 MCG/ACT IN AERO: INHALATION_SPRAY | RESPIRATORY_TRACT | 0 refills | Status: DC

## 2024-07-06 MED ORDER — PREDNISONE 20 MG PO TABS: 40.0000 mg | ORAL_TABLET | Freq: Every day | ORAL | 0 refills | Status: DC

## 2024-07-06 NOTE — ED Provider Notes (Signed)
 [place] [facility] AT [facility] Provider Note   CSN: [id number] Arrival date & time: 07/05/24  2114     Patient presents with: Leg Pain   Brooke Turner is a 48 y.o. female.   Presents to the emergency department for evaluation of leg pain.  Patient reports that she has been experiencing pain in her left leg for some time.  She made an appointment and followed up with vascular surgery, had outpatient studies that ruled out DVT.  Patient has become concerned because for the last week she is now noticing swelling of the left leg.  Pain and swelling seem to be mostly in the left thigh area but she does have some ankle swelling bilaterally, left more than right.  At times she feels slightly short of breath.  No chest pain.       Prior to Admission medications   Medication Sig Start Date End Date Taking? Authorizing Provider  predniSONE  (DELTASONE ) 20 MG tablet Take 2 tablets (40 mg total) by mouth daily with breakfast. 07/06/24  Yes Shonta Bourque, Lonni PARAS, MD  acetaminophen  (TYLENOL ) 500 MG tablet Take 500 mg by mouth every 6 (six) hours as needed (back pain/ cramps).     [provider]  atenolol  (TENORMIN ) 25 MG tablet Take 1 tablet by mouth twice daily 04/17/23   Lavona Agent, MD  Azelastine HCl 137 MCG/SPRAY SOLN Place 1 spray into both nostrils 2 (two) times daily as needed (allergies).    [provider]  blood glucose meter kit and supplies Test blood sugar twice daily. Dx code: R73.09 04/25/19   Eldora Olam CROME, MD  cholecalciferol (VITAMIN D3) 25 MCG (1000 UNIT) tablet Take 1,000 Units by mouth daily.    [provider]  docusate sodium (COLACE) 50 MG capsule Take 50 mg by mouth daily.    [provider]  famotidine  (PEPCID ) 20 MG tablet Take 1 tablet (20 mg total) by mouth 2 (two) times daily. 07/30/21   Eda Iha, MD  Ferrous Sulfate  (IRON ) 325 (65 Fe) MG TABS Take 1 tablet (325 mg total) by mouth daily  with breakfast. 01/26/24   Federico Norleen DASEN IV, MD  glucose blood (ONETOUCH VERIO) test strip TEST BLOOD SUGAR DAILY 09/02/19   Stallings, Zoe A, MD  ibuprofen  (ADVIL ) 600 MG tablet Take 600 mg by mouth every 6 (six) hours as needed.    [provider]  levalbuterol  (XOPENEX  HFA) 45 MCG/ACT inhaler Inhale 1-2 puffs every 6 hours if needed for shortness of breath or wheezing- rescue inhaler. Use 2 puffs 15 minutes prior to exercise. 07/06/24   Haze Lonni PARAS, MD  linaclotide LARUE) 72 MCG capsule Take 72 mcg by mouth daily before breakfast. 06/19/24   [provider]  LORazepam  (ATIVAN ) 0.5 MG tablet Take 0.5 tablets (0.25 mg total) by mouth 3 (three) times daily as needed for anxiety. 07/21/21   Eda Iha, MD  meclizine  (ANTIVERT ) 25 MG tablet Take 25 mg by mouth every 8 (eight) hours as needed. 08/24/22   [provider]  Multiple Vitamins-Minerals (EQ MULTIVITAMINS ADULT GUMMY) CHEW Chew by mouth 2 (two) times daily.    [provider]  nitroGLYCERIN  (NITROSTAT ) 0.4 MG SL tablet Place 0.4 mg under the tongue every 5 (five) minutes as needed. 03/04/21   [provider]  Norethindrone -Ethinyl Estradiol -Fe Biphas (LO LOESTRIN FE) 1 MG-10 MCG / 10 MCG tablet Take 1 tablet by mouth at bedtime.     [provider]  [facility]  DELICA LANCETS 33G MISC USE TO TEST BLOOD SUGAR DAILY 11/16/18   Loreli Elyn SAILOR, MD  pantoprazole  (PROTONIX ) 40 MG tablet Take 1 tablet (40 mg total) by mouth 2 (two) times daily. 12/15/21   Eda Iha, MD  Prenatal Vit-Fe Fumarate-FA (PRENATAL MULTIVITAMIN) TABS tablet Take 1 tablet by mouth daily at 12 noon.    [provider]  progesterone  (PROMETRIUM ) 200 MG capsule Take 200 mg by mouth daily. Pt is unclear of the dose because medication is at home.    [provider]  SF 1.1 % GEL dental gel Place 1 Application onto teeth in the morning and at bedtime. 10/08/22   [provider]   sucralfate  (CARAFATE ) 1 g tablet Dissolve 1 tablet in a small amount of water to create a slurry and take po QID prn 09/21/21   Eda Iha, MD  valsartan (DIOVAN) 40 MG tablet Take 60 mg by mouth daily. 10/24/22   [provider]  vitamin B-12 (CYANOCOBALAMIN ) 1000 MCG tablet Take 1,000 mcg by mouth daily.     [provider]    Allergies: Penicillins, Tamiflu [oseltamivir phosphate], Tinidazole, and Diltiazem     Review of Systems  Updated Vital Signs BP (!) 156/74   Pulse 65   Temp 98.5 F (36.9 C)   Resp 16   LMP 05/23/2024 (Approximate)   SpO2 100%   Physical Exam Vitals and nursing note reviewed.  Constitutional:      General: She is not in acute distress.    Appearance: She is well-developed.  HENT:     Head: Normocephalic and atraumatic.     Mouth/Throat:     Mouth: Mucous membranes are moist.  Eyes:     General: Vision grossly intact. Gaze aligned appropriately.     Extraocular Movements: Extraocular movements intact.     Conjunctiva/sclera: Conjunctivae normal.  Cardiovascular:     Rate and Rhythm: Normal rate and regular rhythm.     Pulses: Normal pulses.     Heart sounds: Normal heart sounds, S1 normal and S2 normal. No murmur heard.    No friction rub. No gallop.  Pulmonary:     Effort: Pulmonary effort is normal. No respiratory distress.     Breath sounds: Normal breath sounds.  Abdominal:     General: Bowel sounds are normal.     Palpations: Abdomen is soft.     Tenderness: There is no abdominal tenderness. There is no guarding or rebound.     Hernia: No hernia is present.  Musculoskeletal:        General: No swelling.     Cervical back: Full passive range of motion without pain, normal range of motion and neck supple. No spinous process tenderness or muscular tenderness. Normal range of motion.     Right lower leg: No edema.     Left lower leg: No edema.  Skin:    General: Skin is warm and dry.     Capillary Refill: Capillary  refill takes less than 2 seconds.     Findings: No ecchymosis, erythema, rash or wound.  Neurological:     General: No focal deficit present.     Mental Status: She is alert and oriented to person, place, and time.     GCS: GCS eye subscore is 4. GCS verbal subscore is 5. GCS motor subscore is 6.     Cranial Nerves: Cranial nerves 2-12 are intact.     Sensory: Sensation is intact.     Motor: Motor function  is intact.     Coordination: Coordination is intact.  Psychiatric:        Attention and Perception: Attention normal.        Mood and Affect: Mood normal.        Speech: Speech normal.        Behavior: Behavior normal.     (all labs ordered are listed, but only abnormal results are displayed) Labs Reviewed  BASIC METABOLIC PANEL WITH GFR - Abnormal; Notable for the following components:      Result Value   Creatinine, Ser 1.03 (*)    Calcium 8.2 (*)    All other components within normal limits  CBC WITH DIFFERENTIAL/PLATELET  BRAIN NATRIURETIC PEPTIDE  D-DIMER, QUANTITATIVE    EKG: EKG Interpretation Date/Time:  Friday July 05 2024 21:47:03 EDT Ventricular Rate:  72 PR Interval:  146 QRS Duration:  78 QT Interval:  402 QTC Calculation: 440 R Axis:   40  Text Interpretation: Normal sinus rhythm Normal ECG When compared with ECG of 09-Jun-2023 13:57, No acute changes Confirmed by Haze Lonni PARAS [phone]) on 07/06/2024 1:40:08 AM  Radiology: [facility] Chest 2 View Result Date: 07/06/2024 CLINICAL DATA:  Initial evaluation for acute shortness of breath. EXAM: CHEST - 2 VIEW COMPARISON:  Prior radiograph from 06/08/2022. FINDINGS: Cardiac silhouette stable in size and contour, and remains within normal limits. Mediastinal silhouette normal. Lungs mildly hypoinflated. Mild scattered peribronchial thickening about the mid and lower lungs. No consolidative airspace disease. No pulmonary edema or pleural effusion. No pneumothorax. Visualized soft tissues and osseous structures  demonstrate no acute finding. IMPRESSION: Mild scattered peribronchial thickening about the mid and lower lungs, which could reflect changes of acute bronchitis and/or reactive airways disease. No consolidative opacity to suggest pneumonia. Electronically Signed   By: Morene Hoard M.D.   On: 07/06/2024 03:57     Procedures   Medications Ordered in the ED - No data to display                                  Medical Decision Making Amount and/or Complexity of Data Reviewed Labs: ordered. Radiology: ordered.   Differential Diagnosis considered includes, but not limited to: CHF; venous stasis; DVT; renal failure; nephrotic syndrome; anasarca; cellulitis; limb ischemia  Patient with pain and swelling of the left leg.  Pain has been ongoing for some time, swelling has recurred over the last week.  She reports pain and swelling of the left thigh with some distal swelling as well.  Patient has palpable dorsalis pedal pulses bilaterally, no concern for ischemia.  No signs of cellulitis.  Patient had an outpatient venous duplex that was -26-month ago.  D-dimer is negative today, DVT is felt to be very unlikely.  Patient concerned that there may still be a clot, will schedule outpatient duplex as this is not available at night.  No signs of congestive heart failure.  BNP is normal.  Chest x-ray with possible bronchitic/reactive airway changes but no signs of edema.  No back pain or signs of radiculopathy.  Normal sensation.  Will treat mild shortness of breath with bronchodilator and steroid.  Outpatient venous duplex of the leg.  Follow-up with Ortho for likely musculoskeletal leg pain.     Final diagnoses:  Left leg pain    ED Discharge Orders          Ordered    predniSONE  (DELTASONE ) 20 MG tablet  Daily  with breakfast        07/06/24 0417    LE VENOUS        07/06/24 0417    levalbuterol  (XOPENEX  HFA) 45 MCG/ACT inhaler       Note to Pharmacy: Pt must keep her appt prior to  receiving further refills.   07/06/24 0417               Haze Lonni PARAS, MD 07/06/24 [phone]

## 2024-07-07 ENCOUNTER — Ambulatory Visit (HOSPITAL_COMMUNITY)
Admission: RE | Admit: 2024-07-07 | Discharge: 2024-07-07 | Disposition: A | Discharge status: Home / Self Care | Source: Ambulatory Visit | Attending: Emergency Medicine | Admitting: Emergency Medicine

## 2024-07-07 DIAGNOSIS — R600 Localized edema: Secondary | ICD-10-CM | POA: Insufficient documentation

## 2024-07-07 DIAGNOSIS — M7989 Other specified soft tissue disorders: Secondary | ICD-10-CM

## 2024-07-07 DIAGNOSIS — I872 Venous insufficiency (chronic) (peripheral): Secondary | ICD-10-CM | POA: Insufficient documentation

## 2024-07-07 NOTE — Progress Notes (Signed)
 VASCULAR LAB    Left lower extremity venous duplex has been performed.  See CV proc for preliminary results.   Tel Hevia, RVT 07/07/2024, 12:44 PM

## 2024-07-08 NOTE — Progress Notes (Deleted)
  Cardiology Office Note:   Date:  07/08/2024  ID:  Brooke Turner, DOB [birth year], MRN [id number] PCP: Waylan Almarie SAUNDERS, MD  [facility] Providers Cardiologist:  Lynwood Schilling, MD Cardiology APP:  Jerilynn Lamarr HERO, NP  Electrophysiologist:  Elspeth Sage, MD {  History of Present Illness:   Brooke Turner is a 48 y.o. female emale who presents for evaluation of chest pain and palpitations.  She was in the ED on 8/29 with leg pain.  I reviewed these records for this visit.    She had no evidence of DVT on Doppler.  BNP and D dimer were negative. ***  ***  Since I last saw her she has done well.  She has had some GI problems with reflux and has had a constant burning discomfort.  Sounds like she has been found to have a dysmotility esophageal disorder and is going to see a specialist.  Prior to this she was walking and jogging and not having any new cardiovascular symptoms.  She has some rare palpitations but these are not as problematic as it had been.  She had no presyncope or syncope.  She is not describing chest pressure, neck or arm discomfort.  She has had no weight gain or edema.   ROS: ***  Studies Reviewed:    EKG:       ***  Risk Assessment/Calculations:   {Does this patient have ATRIAL FIBRILLATION?:[phone]} No BP recorded.  {Refresh Note OR Click here to enter BP  :1}***        Physical Exam:   VS:  LMP 05/23/2024 (Approximate)    Wt Readings from Last 3 Encounters:  06/20/24 224 lb 6.4 oz (101.8 kg)  01/30/24 225 lb (102.1 kg)  09/08/23 227 lb 9.6 oz (103.2 kg)     GEN: Well nourished, well developed in no acute distress NECK: No JVD; No carotid bruits CARDIAC: ***RR, *** murmurs, rubs, gallops RESPIRATORY:  Clear to auscultation without rales, wheezing or rhonchi  ABDOMEN: Soft, non-tender, non-distended EXTREMITIES:  No edema; No deformity   ASSESSMENT AND PLAN:      PALPITATIONS:  ***  These are at baseline and seem to be  treated with the small dose of atenolol  and I will continue this.  LEG PAIN:  ***     Follow up ***  Signed, Lynwood Schilling, MD

## 2024-07-10 ENCOUNTER — Ambulatory Visit: Admitting: Cardiology

## 2024-07-10 DIAGNOSIS — M79606 Pain in leg, unspecified: Secondary | ICD-10-CM

## 2024-07-10 DIAGNOSIS — R002 Palpitations: Secondary | ICD-10-CM

## 2024-07-22 ENCOUNTER — Encounter: Payer: Self-pay | Admitting: Family Medicine

## 2024-07-23 NOTE — Progress Notes (Unsigned)
 Office Note     CC: Painful left lower extremity veins Requesting Provider:  Rollene Almarie LABOR, *  HPI: Brooke Turner is a 48 y.o. (11/29/1975) female presenting at the request of .Waylan Almarie SAUNDERS, MD painful left lower extremity varicose veins.  On exam, Brooke Turner was doing well.  A native of [place] , she moved to [place] roughly 10 years ago.  She works at home, is either seated in the dependent position, or standing most of the day.  Over the last several years, Brooke Turner has had a significant weight gain.  She stated this began during COVID.  Since that time, she has also appreciated some pain in the left calf, specifically at the posterior portion where she has appreciated some varicose veins.  Notably that she does like the way that they look, but notes intermittent pain.  She also appreciates pain in the left hip and thigh, most appreciated at night, and states that she has a difficult time getting comfortable. Denies bleeding, denies ulceration.  No previous venous procedures, no DVT.  Denies symptoms of claudication, ischemic rest pain, tissue loss.   Past Medical History:  Diagnosis Date   Anxiety    Back pain    Chest pain    a. 09/2014 Echo: EF 60-65%, no rwma, nl valves;  b. 12/2014 Neg ETT.   Essential hypertension    a. 01/2016 Renal duplex: no RAS- ? renal cyst (renal u/s 4/17 - no cyst, nl study); b. 02/2016 24 hr BP Monitor: Mean SBP ~ 138 with Max of 198. Highest pressures noted in evening hours following placement of cuff.   Fibroid uterus    GERD (gastroesophageal reflux disease)    Hiatal hernia    Palpitations    a. 09/2014 nl event monitor.    Past Surgical History:  Procedure Laterality Date   39 HOUR PH STUDY N/A 09/01/2021   Procedure: 24 HOUR PH STUDY;  Surgeon: Eda Iha, MD;  Location: [facility];  Service: Gastroenterology;  Laterality: N/A;   ESOPHAGEAL MANOMETRY N/A 09/01/2021   Procedure: ESOPHAGEAL MANOMETRY (EM);   Surgeon: Eda Iha, MD;  Location: [facility];  Service: Gastroenterology;  Laterality: N/A;   ESOPHAGOGASTRODUODENOSCOPY  01/2020   WISDOM TOOTH EXTRACTION      Social History   Socioeconomic History   Marital status: Single    Spouse name: N/A   Number of children: 0   Years of education: 16   Highest education level: Not on file  Occupational History   Occupation: Nutritional therapist: [employer]  Tobacco Use   Smoking status: Former    Current packs/day: 0.00    Average packs/day: 0.3 packs/day for 12.0 years (3.0 ttl pk-yrs)    Types: Cigarettes    Start date: 1999    Quit date: 2011    Years since quitting: 14.7   Smokeless tobacco: Never  Vaping Use   Vaping status: Never Used  Substance and Sexual Activity   Alcohol use: No    Alcohol/week: 0.0 standard drinks of alcohol   Drug use: No   Sexual activity: Not Currently    Partners: Male    Birth control/protection: Pill  Other Topics Concern   Not on file  Social History Narrative   Patient lives at home alone .   Patient works full time at [employer].   Education college.   Right handed.   Caffeine none   Social Drivers of Corporate investment banker Strain: Not on  file  Food Insecurity: Not on file  Transportation Needs: Not on file  Physical Activity: Not on file  Stress: Not on file  Social Connections: Unknown (03/18/2022)   Received from [facility]   Social Network    Social Network: Not on file  Intimate Partner Violence: Unknown (02/07/2022)   Received from [facility]   HITS    Physically Hurt: Not on file    Insult or Talk Down To: Not on file    Threaten Physical Harm: Not on file    Scream or Curse: Not on file   Family History  Problem Relation Age of Onset   Seizures Mother    Epilepsy Mother    GER disease Mother    Cancer Father        Liver   Diabetes Father 39   Hypertension Father    Heart disease Father 51       CEA, LE Stenting   Alzheimer's  disease Maternal Grandmother    Heart disease Maternal Grandfather    Colon cancer Neg Hx    Breast cancer Neg Hx     Current Outpatient Medications  Medication Sig Dispense Refill   acetaminophen  (TYLENOL ) 500 MG tablet Take 500 mg by mouth every 6 (six) hours as needed (back pain/ cramps).      atenolol  (TENORMIN ) 25 MG tablet Take 1 tablet by mouth twice daily 30 tablet 6   Azelastine HCl 137 MCG/SPRAY SOLN Place 1 spray into both nostrils 2 (two) times daily as needed (allergies).     blood glucose meter kit and supplies Test blood sugar twice daily. Dx code: R73.09 1 each 0   cholecalciferol (VITAMIN D3) 25 MCG (1000 UNIT) tablet Take 1,000 Units by mouth daily.     docusate sodium (COLACE) 50 MG capsule Take 50 mg by mouth daily as needed for mild constipation.     famotidine  (PEPCID ) 20 MG tablet Take 1 tablet (20 mg total) by mouth 2 (two) times daily. 180 tablet 0   Ferrous Sulfate  (IRON ) 325 (65 Fe) MG TABS Take 1 tablet (325 mg total) by mouth daily with breakfast. 90 tablet 3   glucose blood (ONETOUCH VERIO) test strip TEST BLOOD SUGAR DAILY 25 each 10   ibuprofen  (ADVIL ) 600 MG tablet Take 600 mg by mouth every 6 (six) hours as needed.     levalbuterol  (XOPENEX  HFA) 45 MCG/ACT inhaler Inhale 1-2 puffs every 6 hours if needed for shortness of breath or wheezing- rescue inhaler. Use 2 puffs 15 minutes prior to exercise. (Patient not taking: Reported on 07/06/2024) 15 g 0   linaclotide (LINZESS) 72 MCG capsule Take 72 mcg by mouth daily before breakfast.     LORazepam  (ATIVAN ) 0.5 MG tablet Take 0.5 tablets (0.25 mg total) by mouth 3 (three) times daily as needed for anxiety.     meclizine  (ANTIVERT ) 25 MG tablet Take 25 mg by mouth every 8 (eight) hours as needed. (Patient not taking: Reported on 07/06/2024)     nitroGLYCERIN  (NITROSTAT ) 0.4 MG SL tablet Place 0.4 mg under the tongue every 5 (five) minutes as needed. (Patient not taking: Reported on 07/06/2024)      Norethindrone -Ethinyl Estradiol -Fe Biphas (LO LOESTRIN FE) 1 MG-10 MCG / 10 MCG tablet Take 1 tablet by mouth at bedtime.      ONETOUCH DELICA LANCETS 33G MISC USE TO TEST BLOOD SUGAR DAILY 100 each 3   pantoprazole  (PROTONIX ) 40 MG tablet Take 1 tablet (40 mg total) by mouth 2 (two)  times daily. 180 tablet 3   predniSONE  (DELTASONE ) 20 MG tablet Take 2 tablets (40 mg total) by mouth daily with breakfast. 10 tablet 0   Prenatal Vit-Fe Fumarate-FA (PRENATAL MULTIVITAMIN) TABS tablet Take 1 tablet by mouth daily at 12 noon.     progesterone  (PROMETRIUM ) 200 MG capsule Take 200 mg by mouth daily as needed (to stop period when it's more than 7 days).     SF 1.1 % GEL dental gel Place 1 Application onto teeth in the morning and at bedtime.     sucralfate  (CARAFATE ) 1 g tablet Dissolve 1 tablet in a small amount of water to create a slurry and take po QID prn 120 tablet 3   valsartan (DIOVAN) 80 MG tablet Take 80 mg by mouth daily.     vitamin B-12 (CYANOCOBALAMIN ) 1000 MCG tablet Take 1,000 mcg by mouth daily.      No current facility-administered medications for this visit.    Allergies  Allergen Reactions   Penicillins Hives, Nausea Only and Palpitations    Has patient had a PCN reaction causing immediate rash, facial/tongue/throat swelling, SOB or lightheadedness with hypotension:Yes Has patient had a PCN reaction causing severe rash involving mucus membranes or skin necrosis:unsure Has patient had a PCN reaction that required hospitalization: Yes Has patient had a PCN reaction occurring within the last 10 years: No If all of the above answers are NO, then may proceed with Cephalosporin use. Sweating     Tamiflu [Oseltamivir Phosphate] Nausea And Vomiting    Excessive vomiting   Tinidazole Other (See Comments)    Caused severe abd pain/type of colitis    Diltiazem  Other (See Comments)    Dizzy, felt like she was going to pass out Blood pressure went up per patient      REVIEW OF  SYSTEMS:  [X]  denotes positive finding, [ ]  denotes negative finding Cardiac  Comments:  Chest pain or chest pressure:    Shortness of breath upon exertion:    Short of breath when lying flat:    Irregular heart rhythm:        Vascular    Pain in calf, thigh, or hip brought on by ambulation:    Pain in feet at night that wakes you up from your sleep:     Blood clot in your veins:    Leg swelling:         Pulmonary    Oxygen at home:    Productive cough:     Wheezing:         Neurologic    Sudden weakness in arms or legs:     Sudden numbness in arms or legs:     Sudden onset of difficulty speaking or slurred speech:    Temporary loss of vision in one eye:     Problems with dizziness:         Gastrointestinal    Blood in stool:     Vomited blood:         Genitourinary    Burning when urinating:     Blood in urine:        Psychiatric    Major depression:         Hematologic    Bleeding problems:    Problems with blood clotting too easily:        Skin    Rashes or ulcers:        Constitutional    Fever or chills:      PHYSICAL  EXAMINATION:  There were no vitals filed for this visit.  General:  WDWN in NAD; vital signs documented above Gait: Not observed HENT: WNL, normocephalic Pulmonary: normal non-labored breathing , without wheezing Cardiac: regular HR Abdomen: soft, NT, no masses Skin: without rashes Vascular Exam/Pulses:  Right Left  Radial 2+ (normal) 2+ (normal)  Ulnar    Femoral    Popliteal    DP 2+ (normal) 2+ (normal)  PT     Extremities: without ischemic changes, without Gangrene , without cellulitis; without open wounds;  Musculoskeletal: no muscle wasting or atrophy  Neurologic: A&O X 3;  No focal weakness or paresthesias are detected Psychiatric:  The pt has Normal affect.   Non-Invasive Vascular Imaging:     +-----+---------------+---------+-----------+----------+--------------+   RIGHTCompressibilityPhasicitySpontaneityPropertiesThrombus Aging  +-----+---------------+---------+-----------+----------+--------------+  CFV Full           Yes      Yes                                  +-----+---------------+---------+-----------+----------+--------------+  SFJ Full                                                         +-----+---------------+---------+-----------+----------+--------------+         +---------+---------------+---------+-----------+----------+--------------+   LEFT    CompressibilityPhasicitySpontaneityPropertiesThrombus  Aging  +---------+---------------+---------+-----------+----------+--------------+   CFV     Full           Yes      Yes                                   +---------+---------------+---------+-----------+----------+--------------+   SFJ     Full                                                          +---------+---------------+---------+-----------+----------+--------------+   FV Prox  Full                                                          +---------+---------------+---------+-----------+----------+--------------+   FV Mid   Full                                                          +---------+---------------+---------+-----------+----------+--------------+   FV DistalFull           Yes      Yes                                   +---------+---------------+---------+-----------+----------+--------------+   PFV     Full                                                          +---------+---------------+---------+-----------+----------+--------------+  POP     Full           Yes      Yes                                   +---------+---------------+---------+-----------+----------+--------------+   PTV     Full                                                           +---------+---------------+---------+-----------+----------+--------------+   PERO    Full                                                          +---------+---------------+---------+-----------+----------+--------------+   +--------------+---------+------+-----------+------------+--------+  LEFT         Reflux NoRefluxReflux TimeDiameter cmsComments                          Yes                                   +--------------+---------+------+-----------+------------+--------+  CFV                    yes   >1 second                       +--------------+---------+------+-----------+------------+--------+  FV mid        no                                              +--------------+---------+------+-----------+------------+--------+  Popliteal    no                                              +--------------+---------+------+-----------+------------+--------+  GSV at SFJ              yes    >500 ms      0.89              +--------------+---------+------+-----------+------------+--------+  GSV prox thigh          yes    >500 ms      0.48              +--------------+---------+------+-----------+------------+--------+  GSV mid thigh           yes    >500 ms      0.31              +--------------+---------+------+-----------+------------+--------+  GSV dist thigh          yes    >500 ms      0.21              +--------------+---------+------+-----------+------------+--------+  GSV at knee   no  0.26              +--------------+---------+------+-----------+------------+--------+  GSV prox calf           yes    >500 ms      0.24              +--------------+---------+------+-----------+------------+--------+  GSV mid calf  no                            0.33              +--------------+---------+------+-----------+------------+--------+  SSV at [facility]    no                             0.46              +--------------+---------+------+-----------+------------+--------+  SSV prox calf no                            0.53              +--------------+---------+------+-----------+------------+--------+   ASSESSMENT/PLAN: Brooke Turner is a 48 y.o. female presenting with symptoms consistent with symptomatic chronic venous insufficiency in the right lower extremity.  She has a small area of cluster varicosities in the posterior aspect of the calf.  These varicosities are very small.  No concern for ulceration or bleeding.  Had a long conversation with Ercia regarding the above most notably that her greater saphenous vein, which demonstrates reflux, is small, not amenable to venous ablation at this time.  Regarding the varicose veins, we discussed that the procedure our group would offer would be stab phlebectomy.  The telangiectasias appreciated on the lower extremity could be ablated using sclerotherapy.  With the current size of her greater saphenous vein as well as small varicosities on the posterior aspect of the calf, I think she would be best served with conservative management via elevation and compression.  We also discussed the importance of weight loss.  I think that with the reflux appreciated in the greater saphenous vein, the vein could increase in size in the coming years.  We discussed that should she become more symptomatic, with more heaviness, edema, pain by days end, I think it would warrant repeat study to see if it becomes sizable enough for venous ablation.  I am hopeful that compression will significantly improve some of the heaviness she has, as well as improve her mild pain at the varicosity sites.  Should these worsen, we could further discuss stab phlebectomy.  We discussed that sclerotherapy is cosmetic, and can be paid for out-of-pocket, and that I would be happy to get her in touch with our nurse who performs the  procedure.  She was sized for, and given a pair of compression stockings in our office today.   Fonda FORBES Rim, MD [facility] [phone]

## 2024-07-25 ENCOUNTER — Encounter: Payer: Self-pay | Admitting: Vascular Surgery

## 2024-07-25 ENCOUNTER — Ambulatory Visit: Attending: Vascular Surgery | Admitting: Vascular Surgery

## 2024-07-25 VITALS — BP 127/79 | HR 68 | Temp 98.3°F | Resp 18 | Ht 60.0 in | Wt 224.5 lb

## 2024-07-25 DIAGNOSIS — I872 Venous insufficiency (chronic) (peripheral): Secondary | ICD-10-CM | POA: Diagnosis not present

## 2024-08-05 NOTE — Progress Notes (Deleted)
  Cardiology Office Note:   Date:  08/05/2024  ID:  Brooke Turner, DOB [birth year], MRN [id number] PCP: Waylan Almarie SAUNDERS, MD  [facility] Providers Cardiologist:  Lynwood Schilling, MD Cardiology APP:  Jerilynn Lamarr HERO, NP  Electrophysiologist:  Elspeth Sage, MD {  History of Present Illness:   Brooke Turner is a 48 y.o. female emale who presents for evaluation of chest pain and palpitations.  She was in the ED on 8/29 with leg pain.  I reviewed these records for this visit.    She had no evidence of DVT on Doppler.  BNP and D dimer were negative. ***  ***  Since I last saw her she has done well.  She has had some GI problems with reflux and has had a constant burning discomfort.  Sounds like she has been found to have a dysmotility esophageal disorder and is going to see a specialist.  Prior to this she was walking and jogging and not having any new cardiovascular symptoms.  She has some rare palpitations but these are not as problematic as it had been.  She had no presyncope or syncope.  She is not describing chest pressure, neck or arm discomfort.  She has had no weight gain or edema.   ROS: ***  Studies Reviewed:    EKG:       ***  Risk Assessment/Calculations:   {Does this patient have ATRIAL FIBRILLATION?:[phone]} No BP recorded.  {Refresh Note OR Click here to enter BP  :1}***        Physical Exam:   VS:  There were no vitals taken for this visit.   Wt Readings from Last 3 Encounters:  07/25/24 224 lb 8 oz (101.8 kg)  06/20/24 224 lb 6.4 oz (101.8 kg)  01/30/24 225 lb (102.1 kg)     GEN: Well nourished, well developed in no acute distress NECK: No JVD; No carotid bruits CARDIAC: ***RR, *** murmurs, rubs, gallops RESPIRATORY:  Clear to auscultation without rales, wheezing or rhonchi  ABDOMEN: Soft, non-tender, non-distended EXTREMITIES:  No edema; No deformity   ASSESSMENT AND PLAN:      PALPITATIONS:  ***  These are at baseline and  seem to be treated with the small dose of atenolol  and I will continue this.  LEG PAIN:  ***     Follow up ***  Signed, Lynwood Schilling, MD    Follow up ***  Signed, Lynwood Schilling, MD

## 2024-08-07 ENCOUNTER — Ambulatory Visit: Admitting: Cardiology

## 2024-08-07 DIAGNOSIS — R002 Palpitations: Secondary | ICD-10-CM

## 2024-09-03 DIAGNOSIS — M7989 Other specified soft tissue disorders: Secondary | ICD-10-CM | POA: Insufficient documentation

## 2024-09-03 NOTE — Progress Notes (Unsigned)
  Cardiology Office Note:   Date:  09/04/2024  ID:  Brooke Turner, DOB May 02, 1976, MRN [id number] PCP: Waylan Almarie SAUNDERS, MD  [facility] Providers Cardiologist:  Lynwood Schilling, MD Cardiology APP:  Jerilynn Lamarr HERO, NP  Electrophysiologist:  Elspeth Sage, MD (Inactive) {  History of Present Illness:   Brooke Turner is a 48 y.o. female emale who presents for evaluation of chest pain and palpitations.  She was in the ED on 8/29 with leg pain.  I reviewed these records for this visit.    She had no evidence of DVT on Doppler.  BNP and D dimer were negative.   She has not been particularly bothered by palpitations.  She has not had any new chest pressure, neck or arm discomfort.  She has not been exercising because she has been working 2 jobs.  When she was exercising earlier this year and the summer she was able to get on the treadmill and do light weightlifting without limitations.  ROS: As stated in the HPI and negative for all other systems.  Studies Reviewed:    EKG:   EKG Interpretation Date/Time:  Wednesday September 04 2024 16:32:43 EDT Ventricular Rate:  63 PR Interval:  158 QRS Duration:  80 QT Interval:  404 QTC Calculation: 413 R Axis:   -1  Text Interpretation: Normal sinus rhythm Possible Left atrial enlargement When compared with ECG of 05-Jul-2024 21:47, No significant change was found Confirmed by Schilling Lynwood ([id number]) on 09/04/2024 4:49:22 PM None  Risk Assessment/Calculations:     Physical Exam:   VS:  BP (!) 142/84   Pulse 63   Ht 5' 4 (1.626 m)   Wt 228 lb (103.4 kg)   BMI 39.14 kg/m    Wt Readings from Last 3 Encounters:  09/04/24 228 lb (103.4 kg)  07/25/24 224 lb 8 oz (101.8 kg)  06/20/24 224 lb 6.4 oz (101.8 kg)     GEN: Well nourished, well developed in no acute distress NECK: No JVD; No carotid bruits CARDIAC: RRR, no murmurs, rubs, gallops RESPIRATORY:  Clear to auscultation without rales, wheezing or rhonchi   ABDOMEN: Soft, non-tender, non-distended EXTREMITIES:  No edema; No deformity   ASSESSMENT AND PLAN:      PALPITATIONS: She is not particular bothered by these.  No change in therapy.  LEG PAIN: She was seen in the ER and I did review these records.  There was no evidence of a DVT.  She was seen by vascular surgery and no change in therapy or further testing was indicated.  ABNORMAL ECHO: The patient has no symptoms consistent with aortic stenosis.  An echo in 2018 had been unremarkable.  However, most recent echo a few years ago suggested aortic stenosis.  I think this was an error in measurement and we will repeat an echocardiogram for completeness.     Follow up ***  Signed, Lynwood Schilling, MD    Follow up ***  Signed, Lynwood Schilling, MD

## 2024-09-04 ENCOUNTER — Ambulatory Visit: Attending: Cardiology | Admitting: Cardiology

## 2024-09-04 ENCOUNTER — Encounter: Payer: Self-pay | Admitting: Cardiology

## 2024-09-04 VITALS — BP 142/84 | HR 63 | Ht 64.0 in | Wt 228.0 lb

## 2024-09-04 DIAGNOSIS — R002 Palpitations: Secondary | ICD-10-CM

## 2024-09-04 DIAGNOSIS — E782 Mixed hyperlipidemia: Secondary | ICD-10-CM

## 2024-09-04 DIAGNOSIS — R079 Chest pain, unspecified: Secondary | ICD-10-CM | POA: Diagnosis not present

## 2024-09-04 DIAGNOSIS — M7989 Other specified soft tissue disorders: Secondary | ICD-10-CM

## 2024-09-04 DIAGNOSIS — I350 Nonrheumatic aortic (valve) stenosis: Secondary | ICD-10-CM

## 2024-09-04 NOTE — Patient Instructions (Signed)
 Medication Instructions:  Your physician recommends that you continue on your current medications as directed. Please refer to the Current Medication list given to you today.  *If you need a refill on your cardiac medications before your next appointment, please call your pharmacy*  Lab Work: [facility] If you have labs (blood work) drawn today and your tests are completely normal, you will receive your results only by: MyChart Message (if you have MyChart) OR A paper copy in the mail If you have any lab test that is abnormal or we need to change your treatment, we will call you to review the results.  Testing/Procedures: Echocardiogram Your physician has requested that you have an echocardiogram. Echocardiography is a painless test that uses sound waves to create images of your heart. It provides your doctor with information about the size and shape of your heart and how well your heart's chambers and valves are working. This procedure takes approximately one hour. There are no restrictions for this procedure. Please do NOT wear cologne, perfume, aftershave, or lotions (deodorant is allowed). Please arrive 15 minutes prior to your appointment time.  Please note: We ask at that you not bring children with you during ultrasound (echo/ vascular) testing. Due to room size and safety concerns, children are not allowed in the ultrasound rooms during exams. Our front office staff cannot provide observation of children in our lobby area while testing is being conducted. An adult accompanying a patient to their appointment will only be allowed in the ultrasound room at the discretion of the ultrasound technician under special circumstances. We apologize for any inconvenience.   Follow-Up: At [facility], you and your health needs are our priority.  As part of our continuing mission to provide you with exceptional heart care, our providers are all part of one team.  This team includes your  primary Cardiologist (physician) and Advanced Practice Providers or APPs (Physician Assistants and Nurse Practitioners) who all work together to provide you with the care you need, when you need it.  Your next appointment:   1 year  Provider:   [place], MD  We recommend signing up for the patient portal called MyChart.  Sign up information is provided on this After Visit Summary.  MyChart is used to connect with patients for Virtual Visits (Telemedicine).  Patients are able to view lab/test results, encounter notes, upcoming appointments, etc.  Non-urgent messages can be sent to your provider as well.   To learn more about what you can do with MyChart, go to [email or web address].

## 2024-10-01 ENCOUNTER — Telehealth: Payer: Self-pay | Admitting: *Deleted

## 2024-10-01 NOTE — Telephone Encounter (Signed)
 Patient left following message: She saw her PCP Dr. Waylan last week and had labs done. Her ferritin is down - she thinks the MD said 8. Patient feels like it is low. She is currently taking a liquid iron  because the pills cause stomach problems.Dr. Waylan is not sure the liquid iron  is enough and has recommended Slo-Fe and that she f/u with Brooke Turner. Brooke Turner said she wants to know what Brooke Turner thinks.   Brooke Turner provided with above information from patient. He advises she have labs done her at [facility] this week or early next week. Once results are available, he will review and recommend nest steps.  TCT to patient to provide information from Brooke Turner. LVM on un-named VM asking patient to contact Dr. Lafonda office.

## 2024-10-02 ENCOUNTER — Telehealth: Payer: Self-pay

## 2024-10-02 ENCOUNTER — Other Ambulatory Visit: Payer: Self-pay

## 2024-10-02 ENCOUNTER — Inpatient Hospital Stay: Attending: Hematology and Oncology

## 2024-10-02 DIAGNOSIS — E611 Iron deficiency: Secondary | ICD-10-CM | POA: Insufficient documentation

## 2024-10-02 DIAGNOSIS — N920 Excessive and frequent menstruation with regular cycle: Secondary | ICD-10-CM | POA: Insufficient documentation

## 2024-10-02 DIAGNOSIS — Z87891 Personal history of nicotine dependence: Secondary | ICD-10-CM | POA: Insufficient documentation

## 2024-10-02 DIAGNOSIS — K5900 Constipation, unspecified: Secondary | ICD-10-CM | POA: Insufficient documentation

## 2024-10-02 DIAGNOSIS — D500 Iron deficiency anemia secondary to blood loss (chronic): Secondary | ICD-10-CM

## 2024-10-02 LAB — CBC WITH DIFFERENTIAL (CANCER CENTER ONLY)
Abs Immature Granulocytes: 0.01 K/uL (ref 0.00–0.07)
Basophils Absolute: 0 K/uL (ref 0.0–0.1)
Basophils Relative: 1 %
Eosinophils Absolute: 0.1 K/uL (ref 0.0–0.5)
Eosinophils Relative: 1 %
HCT: 37.6 % (ref 36.0–46.0)
Hemoglobin: 13.2 g/dL (ref 12.0–15.0)
Immature Granulocytes: 0 %
Lymphocytes Relative: 48 %
Lymphs Abs: 3.1 K/uL (ref 0.7–4.0)
MCH: 30.5 pg (ref 26.0–34.0)
MCHC: 35.1 g/dL (ref 30.0–36.0)
MCV: 86.8 fL (ref 80.0–100.0)
Monocytes Absolute: 0.5 K/uL (ref 0.1–1.0)
Monocytes Relative: 8 %
Neutro Abs: 2.8 K/uL (ref 1.7–7.7)
Neutrophils Relative %: 42 %
Platelet Count: 258 K/uL (ref 150–400)
RBC: 4.33 MIL/uL (ref 3.87–5.11)
RDW: 12.7 % (ref 11.5–15.5)
WBC Count: 6.5 K/uL (ref 4.0–10.5)
nRBC: 0 % (ref 0.0–0.2)

## 2024-10-02 LAB — CMP (CANCER CENTER ONLY)
ALT: 17 U/L (ref 0–44)
AST: 28 U/L (ref 15–41)
Albumin: 4.2 g/dL (ref 3.5–5.0)
Alkaline Phosphatase: 65 U/L (ref 38–126)
Anion gap: 12 (ref 5–15)
BUN: 10 mg/dL (ref 6–20)
CO2: 21 mmol/L — ABNORMAL LOW (ref 22–32)
Calcium: 8.8 mg/dL — ABNORMAL LOW (ref 8.9–10.3)
Chloride: 107 mmol/L (ref 98–111)
Creatinine: 1.13 mg/dL — ABNORMAL HIGH (ref 0.44–1.00)
GFR, Estimated: 59 mL/min — ABNORMAL LOW (ref >60)
Glucose, Bld: 117 mg/dL — ABNORMAL HIGH (ref 70–99)
Potassium: 3.8 mmol/L (ref 3.5–5.1)
Sodium: 140 mmol/L (ref 135–145)
Total Bilirubin: 0.6 mg/dL (ref 0.0–1.2)
Total Protein: 7.1 g/dL (ref 6.5–8.1)

## 2024-10-02 LAB — IRON AND IRON BINDING CAPACITY (CC-WL,HP ONLY)
Iron: 98 ug/dL (ref 28–170)
Saturation Ratios: 21 % (ref 10.4–31.8)
TIBC: 477 ug/dL — ABNORMAL HIGH (ref 250–450)
UIBC: 380 ug/dL

## 2024-10-02 LAB — RETIC PANEL
Immature Retic Fract: 7.4 % (ref 2.3–15.9)
RBC.: 4.26 MIL/uL (ref 3.87–5.11)
Retic Count, Absolute: 44.3 K/uL (ref 19.0–186.0)
Retic Ct Pct: 1 % (ref 0.4–3.1)
Reticulocyte Hemoglobin: 34.6 pg (ref >27.9)

## 2024-10-02 LAB — FERRITIN: Ferritin: 17 ng/mL (ref 11–307)

## 2024-10-02 NOTE — Telephone Encounter (Signed)
 Pt called today and requested information on taking Slo-Fe and reported her PCP sent labs to Dr Federico.  No lab results found in chart.  Pt given fax number to request PCP send recent lab values. Consulted with Dr Federico who stated he does not recommend SLO-Fe and advises pt come to [facility] to have labs drawn   Pt informed of above and agreeable  to come in today.  Given appt for 1:45 pm today; labs ordered.

## 2024-10-03 ENCOUNTER — Other Ambulatory Visit: Payer: Self-pay | Admitting: Hematology and Oncology

## 2024-10-06 ENCOUNTER — Other Ambulatory Visit: Payer: Self-pay | Admitting: Hematology and Oncology

## 2024-10-07 ENCOUNTER — Ambulatory Visit: Payer: Self-pay | Admitting: *Deleted

## 2024-10-07 NOTE — Telephone Encounter (Signed)
 TCT patient regarding recent lab results. Spoke with her. Advised that her labs do show iron  deficiency. IV iron  has been ordered for [address]. Advised that she can expect to her hear from the infusion center this week.  Advised that we will keep her visit in Feb 2026 as follow up. Pt voiced understanding.

## 2024-10-07 NOTE — Telephone Encounter (Signed)
-----   Message from Norleen ONEIDA Kidney IV sent at 10/06/2024  9:14 AM EST ----- Her labs do indeed show iron  deficiency. IV iron  has been ordered for [address]. We will keep our visit in Feb 2026 as follow up. ----- Message ----- From: Rebecka, [facility] [place] Sent: 10/02/2024   2:22 PM EST To: Norleen ONEIDA Kidney [place], MD

## 2024-10-08 ENCOUNTER — Other Ambulatory Visit (HOSPITAL_COMMUNITY): Payer: Self-pay | Admitting: Hematology and Oncology

## 2024-10-08 ENCOUNTER — Encounter: Payer: Self-pay | Admitting: Family Medicine

## 2024-10-08 ENCOUNTER — Telehealth (HOSPITAL_COMMUNITY): Payer: Self-pay | Admitting: Pharmacy Technician

## 2024-10-08 ENCOUNTER — Encounter (HOSPITAL_COMMUNITY): Payer: Self-pay | Admitting: Hematology and Oncology

## 2024-10-08 NOTE — Telephone Encounter (Signed)
 Auth Submission: APPROVED Site of care: [facility] Payer: [employer] Medication & CPT/J Code(s) submitted: Monoferric (Ferrci derisomaltose) [phone] Diagnosis Code: D50.0 Route of submission (phone, fax, portal): phone Phone # [phone] Fax # [phone]  (for clinicals and labs) Auth type: Buy/Bill HB Units/visits requested: 1000mg  x 1 dose CASE# [id number] Reference number:  Approval from: 10/08/24 to 11/07/24    Dagoberto Armour, CPhT [facility] Phone: [phone] 10/08/2024

## 2024-10-16 ENCOUNTER — Telehealth: Payer: Self-pay

## 2024-10-16 NOTE — Telephone Encounter (Signed)
 Returned call to patient. Pt stated she has rec'd calls from [facility] and not [facility] to schedule an appointment for iron  infusion.  Encouraged pt to return the call and make the appointment. Her next concern is that an iron  infusion will cause her to have a DVT.  She denied signs and symptoms of a DVT at this time. Assured pt that the iron  infusion is not a cause for DVT. Again encouraged her to return call for appt to schedule infusion.  Pt reported she is not tolerating the oral iron  recommendations by Dr. Federico and wants him to prescribe something different. Dr Federico made aware.

## 2024-10-18 ENCOUNTER — Ambulatory Visit (HOSPITAL_COMMUNITY)
Admission: RE | Admit: 2024-10-18 | Discharge: 2024-10-18 | Disposition: A | Source: Ambulatory Visit | Attending: Cardiology | Admitting: Cardiology

## 2024-10-18 DIAGNOSIS — I350 Nonrheumatic aortic (valve) stenosis: Secondary | ICD-10-CM

## 2024-10-18 LAB — ECHOCARDIOGRAM COMPLETE
Area-P 1/2: 3.26 cm2
S' Lateral: 2.4 cm

## 2024-10-20 ENCOUNTER — Ambulatory Visit: Payer: Self-pay | Admitting: Cardiology

## 2024-10-28 ENCOUNTER — Ambulatory Visit (HOSPITAL_COMMUNITY)
Admission: RE | Admit: 2024-10-28 | Discharge: 2024-10-28 | Disposition: A | Discharge status: Home / Self Care | Source: Ambulatory Visit | Attending: Hematology and Oncology | Admitting: Hematology and Oncology

## 2024-10-28 VITALS — BP 148/70 | HR 52 | Temp 97.9°F | Resp 16

## 2024-10-28 DIAGNOSIS — D500 Iron deficiency anemia secondary to blood loss (chronic): Secondary | ICD-10-CM | POA: Insufficient documentation

## 2024-10-28 MED ORDER — SODIUM CHLORIDE 0.9 % IV SOLN
1000.0000 mg | Freq: Once | INTRAVENOUS | Status: AC
  Administered 2024-10-28: 1000 mg via INTRAVENOUS
  Filled 2024-10-28: qty 1000

## 2024-11-01 ENCOUNTER — Telehealth: Payer: Self-pay

## 2024-11-01 NOTE — Telephone Encounter (Signed)
 Patient called states she has developed a rash on her left ankle that spread up the back of the calf about half way. States it itches and she has been scratching it. Denies any pustules on the area.Denies any dyspnea or trouble swallowing. Denies fever chills any recent illness. Received monoferric   infusion on Monday without any incident. States she felt bad on Tuesday just lethargic and fuzzy headed, again no fever chills dyspnea noted. Went OOT for the holiday and developed the rash started yesterday. Discussed taking Zyrtec  using hydrocortisone cream on the area. Advised to go to urgent care if it started to spread or to the ED is she started to have rash on her upper body and developed dyspnea. Patient verbalized understanding. Message sent to DR Dorsey's nurse for her to check on patient Monday. Arland Legions BSN RN

## 2024-11-11 ENCOUNTER — Telehealth: Payer: Self-pay

## 2024-11-11 NOTE — Telephone Encounter (Signed)
 Returned pt's call regarding rash which she stated started 12/26 on her ankles.  Reports it faded  and now a different looking rash has presented on her chest, arms /forearms.  Left message for pt to return call as no ID on voicemail. Plan to advise pt to contact PCP for rash follow up.

## 2024-12-25 ENCOUNTER — Inpatient Hospital Stay: Attending: Hematology and Oncology

## 2024-12-25 ENCOUNTER — Inpatient Hospital Stay: Admitting: Hematology and Oncology
